# Patient Record
Sex: Male | Born: 1935 | Race: White | Hispanic: No | Marital: Married | Smoking: Never smoker
Health system: Southern US, Community
[De-identification: ages and names within clinical notes are randomized; demographics above are authoritative.]

## PROBLEM LIST (undated history)

## (undated) DIAGNOSIS — I4510 Unspecified right bundle-branch block: Secondary | ICD-10-CM

## (undated) DIAGNOSIS — Z8739 Personal history of other diseases of the musculoskeletal system and connective tissue: Secondary | ICD-10-CM

## (undated) DIAGNOSIS — I509 Heart failure, unspecified: Secondary | ICD-10-CM

## (undated) DIAGNOSIS — R011 Cardiac murmur, unspecified: Secondary | ICD-10-CM

## (undated) DIAGNOSIS — J439 Emphysema, unspecified: Secondary | ICD-10-CM

## (undated) DIAGNOSIS — R0602 Shortness of breath: Secondary | ICD-10-CM

## (undated) DIAGNOSIS — K219 Gastro-esophageal reflux disease without esophagitis: Secondary | ICD-10-CM

## (undated) DIAGNOSIS — M778 Other enthesopathies, not elsewhere classified: Secondary | ICD-10-CM

## (undated) DIAGNOSIS — I10 Essential (primary) hypertension: Secondary | ICD-10-CM

## (undated) DIAGNOSIS — R600 Localized edema: Secondary | ICD-10-CM

## (undated) DIAGNOSIS — I480 Paroxysmal atrial fibrillation: Secondary | ICD-10-CM

## (undated) DIAGNOSIS — Z8679 Personal history of other diseases of the circulatory system: Secondary | ICD-10-CM

## (undated) DIAGNOSIS — Z5189 Encounter for other specified aftercare: Secondary | ICD-10-CM

## (undated) DIAGNOSIS — R351 Nocturia: Secondary | ICD-10-CM

## (undated) DIAGNOSIS — E7800 Pure hypercholesterolemia, unspecified: Secondary | ICD-10-CM

## (undated) DIAGNOSIS — R609 Edema, unspecified: Secondary | ICD-10-CM

## (undated) DIAGNOSIS — H269 Unspecified cataract: Secondary | ICD-10-CM

## (undated) DIAGNOSIS — M1990 Unspecified osteoarthritis, unspecified site: Secondary | ICD-10-CM

## (undated) HISTORY — DX: Personal history of other diseases of the musculoskeletal system and connective tissue: Z87.39

## (undated) HISTORY — DX: Nocturia: R35.1

## (undated) HISTORY — DX: Unspecified right bundle-branch block: I45.10

## (undated) HISTORY — DX: Pure hypercholesterolemia, unspecified: E78.00

## (undated) HISTORY — DX: Emphysema, unspecified: J43.9

## (undated) HISTORY — DX: Gastro-esophageal reflux disease without esophagitis: K21.9

## (undated) HISTORY — DX: Edema, unspecified: R60.9

## (undated) HISTORY — PX: EYE SURGERY: SHX253

## (undated) HISTORY — DX: Heart failure, unspecified: I50.9

## (undated) HISTORY — PX: JOINT REPLACEMENT: SHX530

## (undated) HISTORY — DX: Cardiac murmur, unspecified: R01.1

## (undated) HISTORY — DX: Shortness of breath: R06.02

## (undated) HISTORY — DX: Other enthesopathies, not elsewhere classified: M77.8

## (undated) HISTORY — PX: SPINE SURGERY: SHX786

## (undated) HISTORY — DX: Localized edema: R60.0

## (undated) HISTORY — DX: Personal history of other diseases of the circulatory system: Z86.79

## (undated) HISTORY — DX: Unspecified osteoarthritis, unspecified site: M19.90

## (undated) HISTORY — DX: Essential (primary) hypertension: I10

## (undated) HISTORY — DX: Paroxysmal atrial fibrillation: I48.0

## (undated) HISTORY — DX: Encounter for other specified aftercare: Z51.89

## (undated) HISTORY — DX: Unspecified cataract: H26.9

---

## 2001-05-24 ENCOUNTER — Ambulatory Visit (HOSPITAL_BASED_OUTPATIENT_CLINIC_OR_DEPARTMENT_OTHER): Admission: RE | Admit: 2001-05-24 | Discharge: 2001-05-24 | Payer: Self-pay | Admitting: Orthopedic Surgery

## 2002-04-05 ENCOUNTER — Ambulatory Visit (HOSPITAL_COMMUNITY): Admission: RE | Admit: 2002-04-05 | Discharge: 2002-04-05 | Payer: Self-pay | Admitting: Surgery

## 2002-04-05 ENCOUNTER — Encounter: Payer: Self-pay | Admitting: Cardiology

## 2002-11-17 ENCOUNTER — Ambulatory Visit (HOSPITAL_COMMUNITY): Admission: RE | Admit: 2002-11-17 | Discharge: 2002-11-17 | Payer: Self-pay | Admitting: Gastroenterology

## 2002-11-17 ENCOUNTER — Encounter (INDEPENDENT_AMBULATORY_CARE_PROVIDER_SITE_OTHER): Payer: Self-pay | Admitting: Specialist

## 2004-02-11 HISTORY — PX: TOTAL KNEE ARTHROPLASTY: SHX125

## 2004-02-11 HISTORY — PX: REPLACEMENT TOTAL KNEE: SUR1224

## 2004-02-20 ENCOUNTER — Inpatient Hospital Stay (HOSPITAL_COMMUNITY): Admission: RE | Admit: 2004-02-20 | Discharge: 2004-02-25 | Payer: Self-pay | Admitting: Orthopedic Surgery

## 2005-05-25 ENCOUNTER — Encounter: Admission: RE | Admit: 2005-05-25 | Discharge: 2005-05-25 | Payer: Self-pay | Admitting: Orthopedic Surgery

## 2005-05-30 ENCOUNTER — Inpatient Hospital Stay (HOSPITAL_COMMUNITY): Admission: RE | Admit: 2005-05-30 | Discharge: 2005-06-01 | Payer: Self-pay | Admitting: Specialist

## 2005-12-19 ENCOUNTER — Inpatient Hospital Stay (HOSPITAL_COMMUNITY): Admission: RE | Admit: 2005-12-19 | Discharge: 2005-12-20 | Payer: Self-pay | Admitting: Neurosurgery

## 2006-02-10 HISTORY — PX: PATELLAR TENDON REPAIR: SHX737

## 2006-03-22 ENCOUNTER — Encounter: Admission: RE | Admit: 2006-03-22 | Discharge: 2006-03-22 | Payer: Self-pay | Admitting: Neurosurgery

## 2007-02-11 HISTORY — PX: CARDIAC CATHETERIZATION: SHX172

## 2007-08-20 ENCOUNTER — Encounter: Admission: RE | Admit: 2007-08-20 | Discharge: 2007-08-20 | Payer: Self-pay | Admitting: Cardiology

## 2007-08-23 ENCOUNTER — Inpatient Hospital Stay (HOSPITAL_BASED_OUTPATIENT_CLINIC_OR_DEPARTMENT_OTHER): Admission: RE | Admit: 2007-08-23 | Discharge: 2007-08-24 | Payer: Self-pay | Admitting: Cardiology

## 2007-11-08 ENCOUNTER — Encounter: Admission: RE | Admit: 2007-11-08 | Discharge: 2007-11-08 | Payer: Self-pay | Admitting: Neurosurgery

## 2007-11-18 ENCOUNTER — Ambulatory Visit: Payer: Self-pay | Admitting: Internal Medicine

## 2007-11-18 DIAGNOSIS — R0609 Other forms of dyspnea: Secondary | ICD-10-CM

## 2007-11-22 ENCOUNTER — Ambulatory Visit: Payer: Self-pay | Admitting: Internal Medicine

## 2007-11-27 DIAGNOSIS — E785 Hyperlipidemia, unspecified: Secondary | ICD-10-CM | POA: Insufficient documentation

## 2007-11-27 DIAGNOSIS — I10 Essential (primary) hypertension: Secondary | ICD-10-CM

## 2007-11-29 ENCOUNTER — Telehealth: Payer: Self-pay | Admitting: Internal Medicine

## 2007-12-02 ENCOUNTER — Encounter: Payer: Self-pay | Admitting: Internal Medicine

## 2007-12-28 ENCOUNTER — Ambulatory Visit: Payer: Self-pay | Admitting: Internal Medicine

## 2007-12-28 DIAGNOSIS — J449 Chronic obstructive pulmonary disease, unspecified: Secondary | ICD-10-CM | POA: Insufficient documentation

## 2008-01-26 ENCOUNTER — Ambulatory Visit: Payer: Self-pay | Admitting: Internal Medicine

## 2008-02-11 HISTORY — PX: BACK SURGERY: SHX140

## 2008-07-26 ENCOUNTER — Ambulatory Visit: Payer: Self-pay | Admitting: Internal Medicine

## 2009-05-17 ENCOUNTER — Encounter: Admission: RE | Admit: 2009-05-17 | Discharge: 2009-05-17 | Payer: Self-pay | Admitting: Neurosurgery

## 2009-05-20 ENCOUNTER — Encounter: Admission: RE | Admit: 2009-05-20 | Discharge: 2009-05-20 | Payer: Self-pay | Admitting: Neurosurgery

## 2009-07-26 ENCOUNTER — Ambulatory Visit: Payer: Self-pay | Admitting: Internal Medicine

## 2009-10-18 ENCOUNTER — Encounter: Payer: Self-pay | Admitting: Internal Medicine

## 2009-11-06 ENCOUNTER — Encounter: Payer: Self-pay | Admitting: Internal Medicine

## 2009-11-20 ENCOUNTER — Ambulatory Visit: Payer: Self-pay | Admitting: Cardiology

## 2009-11-27 ENCOUNTER — Ambulatory Visit: Payer: Self-pay | Admitting: Cardiology

## 2009-12-03 ENCOUNTER — Telehealth (INDEPENDENT_AMBULATORY_CARE_PROVIDER_SITE_OTHER): Payer: Self-pay | Admitting: *Deleted

## 2009-12-04 ENCOUNTER — Ambulatory Visit: Payer: Self-pay | Admitting: Cardiovascular Disease

## 2009-12-04 ENCOUNTER — Encounter (HOSPITAL_COMMUNITY)
Admission: RE | Admit: 2009-12-04 | Discharge: 2010-02-09 | Payer: Self-pay | Source: Home / Self Care | Attending: Cardiology | Admitting: Cardiology

## 2009-12-04 ENCOUNTER — Ambulatory Visit: Payer: Self-pay

## 2009-12-04 ENCOUNTER — Encounter: Payer: Self-pay | Admitting: Cardiovascular Disease

## 2009-12-24 ENCOUNTER — Inpatient Hospital Stay (HOSPITAL_COMMUNITY)
Admission: RE | Admit: 2009-12-24 | Discharge: 2009-12-27 | Payer: Self-pay | Source: Home / Self Care | Admitting: Neurosurgery

## 2009-12-24 HISTORY — PX: BACK SURGERY: SHX140

## 2010-01-31 ENCOUNTER — Encounter
Admission: RE | Admit: 2010-01-31 | Discharge: 2010-01-31 | Payer: Self-pay | Source: Home / Self Care | Attending: Neurosurgery | Admitting: Neurosurgery

## 2010-02-14 ENCOUNTER — Ambulatory Visit: Payer: Self-pay | Admitting: Cardiology

## 2010-02-15 ENCOUNTER — Ambulatory Visit: Payer: Self-pay | Admitting: Oncology

## 2010-02-18 ENCOUNTER — Encounter: Payer: Self-pay | Admitting: Internal Medicine

## 2010-02-18 LAB — CBC WITH DIFFERENTIAL/PLATELET
BASO%: 0.7 % (ref 0.0–2.0)
Basophils Absolute: 0 10*3/uL (ref 0.0–0.1)
EOS%: 0.3 % (ref 0.0–7.0)
Eosinophils Absolute: 0 10*3/uL (ref 0.0–0.5)
HCT: 41.3 % (ref 38.4–49.9)
HGB: 14.3 g/dL (ref 13.0–17.1)
LYMPH%: 29.2 % (ref 14.0–49.0)
MCH: 33.7 pg — ABNORMAL HIGH (ref 27.2–33.4)
MCHC: 34.6 g/dL (ref 32.0–36.0)
MCV: 97.4 fL (ref 79.3–98.0)
MONO#: 0.6 10*3/uL (ref 0.1–0.9)
MONO%: 8.9 % (ref 0.0–14.0)
NEUT#: 3.8 10*3/uL (ref 1.5–6.5)
NEUT%: 60.9 % (ref 39.0–75.0)
Platelets: 163 10*3/uL (ref 140–400)
RBC: 4.24 10*6/uL (ref 4.20–5.82)
RDW: 14.2 % (ref 11.0–14.6)
WBC: 6.3 10*3/uL (ref 4.0–10.3)
lymph#: 1.8 10*3/uL (ref 0.9–3.3)

## 2010-02-18 LAB — RETICULOCYTES
Immature Retic Fract: 5.4 % (ref 0.00–13.40)
RBC: 4.31 10*6/uL (ref 4.20–5.82)
Retic %: 2.39 % — ABNORMAL HIGH (ref 0.50–1.60)
Retic Ct Abs: 103.01 10*3/uL — ABNORMAL HIGH (ref 24.10–77.50)

## 2010-02-18 LAB — CHCC SMEAR

## 2010-02-18 LAB — FERRITIN: Ferritin: 63 ng/mL (ref 22–322)

## 2010-02-18 LAB — VITAMIN B12: Vitamin B-12: 193 pg/mL — ABNORMAL LOW (ref 211–911)

## 2010-02-19 ENCOUNTER — Ambulatory Visit: Payer: Self-pay | Admitting: Cardiology

## 2010-03-12 ENCOUNTER — Encounter
Admission: RE | Admit: 2010-03-12 | Discharge: 2010-03-12 | Payer: Self-pay | Source: Home / Self Care | Attending: Neurosurgery | Admitting: Neurosurgery

## 2010-03-12 NOTE — Assessment & Plan Note (Signed)
Summary: 12 months/apc   Primary Provider/Referring Provider:  Ronny Flurry  CC:  Yearly Follow up visit-doing good; no complaints..  History of Present Illness: 12/28/07- Chronic bronchitis, dyspnea Increased wheeze, cough x 2 weeks. Denies fever, purulent or bloody, reflux, chest pain, palpitation. PFT 10/12- mild obstruction, small airways. Nl 513 meters. Denies heart burn on Prilosec. Went back to benazepril- saw no difference. spiriva no help.  01/26/08- Chronic bronchits, dyspnea Doing better with Advair. Less dyspnea, but expensive. Discussed options. No cough and no acute concerns. We discussed stimulant effect vs steroid effect.  6/15.10- Chronic bronchitis, dyspnea Some tightness in Spring pollen but ok since. Breathes well unless he really overexerts- especially hills and stairs. Denies chest pain, palpitation, cough, phlegm, wheeze, nasal congestion or sinus drainage. Advair once daily is usually sufficient.   July 26, 2009- Chronic bronchitis, dyspnea One year f/u. Breathing has done very well. He is getting epidurals for spondylolisthesis and anticipates surgery in late summer (Dr Wynetta Emery). Contiinues to use Advair once daily, recently back to two times a day since air quality has been poor. Breathing doing much better. Occasional sense of "collection" in throat- clear mucus. Takes prilosec- not aware of reflux or heartburn. Denies any sense of chest tightness or wheeze. Denies chest pain or palpitation. Not limited by dyspnea, but avoids steep hills. Walks golf course. PFT- 2009- FEV1 2.50/ 79%; FEV1/FVC 0.65, insig resp to BD. CXR- 2 yrs ago- Dr Patty Sermons     Preventive Screening-Counseling & Management  Alcohol-Tobacco     Smoking Status: never  Current Medications (verified): 1)  Advair Diskus 250-50 Mcg/dose Misc (Fluticasone-Salmeterol) .[name].. 1 Puff and Rinse Twice Daily 2)  Lipitor 40 Mg Tabs (Atorvastatin Calcium) .[name].. 1 Tablet  Once Daily 3)  Lotrel 10-20 Mg  Caps (Amlodipine Besy-Benazepril Hcl) .[name].. 1 Capsule Once Daily N 4)  Ziac 10-6.25 Mg Tabs (Bisoprolol-Hydrochlorothiazide) .[name].. 1 Tablet Once Daily 5)  Prilosec 20 Mg Cpdr (Omeprazole) .... Take 1 By Mouth Once Daily 6)  Gabapentin 300 Mg Caps (Gabapentin) .... Take 1-2 By Mouth Once Daily  Allergies (verified): 1)  ! 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  social ETOH with golf married retired Acupuncturist  Risk Factors: Smoking Status: never (07/26/2009)  Family History: COPD- brothers and father smoked asthma- sister adult onset Father- died COPD, CHF age 86 Mother - died accident  Review of Systems      See HPI       The patient complains of shortness of breath with activity.  The patient denies shortness of breath at rest, productive cough, non-productive cough, coughing up blood, chest pain, irregular heartbeats, acid heartburn, indigestion, loss of appetite, weight change, abdominal pain, difficulty swallowing, sore throat, tooth/dental problems, headaches, nasal congestion/difficulty breathing through nose, and sneezing.    Vital Signs:  Patient profile:   76 year old male Height:      73 inches Weight:      219 pounds BMI:     29.00 O2 Sat:      95 % on Room air Pulse rate:   60 / minute BP sitting:   124 / 68  (left arm) Cuff  size:   regular  Vitals Entered By: Reynaldo Minium CMA (July 26, 2009 9:02 AM)  O2 Flow:  Room air CC: Yearly Follow up visit-doing good; no complaints.   Physical Exam  Additional Exam:  General: A/Ox3; pleasant  and cooperative, NAD, Fit appearing SKIN: no rash, lesions NODES: no lymphadenopathy HEENT: [place]/AT, EOM- WNL, Conjuctivae- clear, PERRLA, TM-WNL, Nose- clear, Throat- clear and wnl, Mallampati II NECK: Supple w/ fair ROM, JVD- none, NO HJR, normal carotid impulses w/o bruits Thyroid-  CHEST: No cough, no wheeze or rhonchi, unlabored. Clear to P&A HEART: RRR, no m/g/r heard ABDOMEN: Soft and nl; WNU:UVOZ, nl pulses, no cyanosis, clyubbing or edema NEURO: Grossly intact to observation      Impression & Recommendations:  Problem # 1:  BRONCHITIS (ICD-490)  Chronic asthmatic bronchitis, controlled with Advair. I can't get hx suggesting he would benfit from a rescue inhlaer. PFT in 2009 reviewed. We will get CXR anticipating Surgery later this summer.   I do not find problems of significant increased risk or neediing remediation before planned surgery/ GOT anesthesia.  His updated medication list for this problem includes:    Advair Diskus 250-50 Mcg/dose Misc (Fluticasone-salmeterol) .[name]... 1 puff and rinse twice daily  Problem # 2:  DYSPNEA (ICD-786.05) Primarily consistent eith his lung function, but a conditioning component may be present and regular exercise is encouraged.  Medications Added to Medication List This Visit: 1)  Prilosec 20 Mg Cpdr (Omeprazole) .... Take 1 by mouth once daily 2)  Gabapentin 300 Mg Caps (Gabapentin) .... Take 1-2 by mouth once daily  Other Orders: Est. Patient Level IV (36644) T-2 View CXR (71020TC)  Patient Instructions: 1)  Please schedule a follow-up appointment in 1 year. 2)  CC- Dr Patty Sermons, Dr Wynetta Emery 3)  A chest x-ray has been recommended.  Your imaging study may require preauthorization.  4)  Continue Advair 1-2 x/ daily as before.

## 2010-03-12 NOTE — Letter (Signed)
Summary: [facility]  [facility]   Imported By: Lester Harrisburg 11/23/2009 08:11:37  _____________________________________________________________________  External Attachment:    Type:   Image     Comment:   External Document

## 2010-03-12 NOTE — Progress Notes (Signed)
Summary: Nuclear pre procedure  Phone Note Outgoing Call Call back at [facility] Phone [phone]   Call placed by: Rea College, CMA,  December 03, 2009 3:05 PM Call placed to: Patient Summary of Call: Reviewed information on My[employer] Information Sheet (see scanned document for further details).  Thurston Hole spoke with patient.     Nuclear Med Background Indications for Stress Test: Evaluation for Ischemia, Surgical Clearance  Indications Comments: Pending back surgery on 12/24/09 by Dr. Donalee Citrin  History: Heart Catheterization, Myocardial Perfusion Study  History Comments: '09 [id number] scar with mild inferior ischemia, EF=50%>Cath:n/o CAD.     Nuclear Pre-Procedure Cardiac Risk Factors: Family History - CAD, Hypertension, Lipids, RBBB Height (in): 73

## 2010-03-12 NOTE — Letter (Signed)
Summary: Surgical clearance/[facility]  Surgical clearance/[facility]   Imported By: Lester Edenborn 11/23/2009 08:08:20  _____________________________________________________________________  External Attachment:    Type:   Image     Comment:   External Document

## 2010-03-12 NOTE — Assessment & Plan Note (Signed)
Summary: Cardiology Nuclear Testing  Nuclear Med Background Indications for Stress Test: Evaluation for Ischemia, Surgical Clearance  Indications Comments: Pending back surgery on 12/24/09 by Dr. Donalee Citrin  History: Heart Catheterization, Myocardial Perfusion Study  History Comments: '09 [id number] scar with mild inferior ischemia, EF=50%>Cath:n/o CAD.     Nuclear Pre-Procedure Cardiac Risk Factors: Family History - CAD, Hypertension, Lipids, RBBB Caffeine/Decaff Intake: None NPO After: 8:00 PM IV 0.9% NS with Angio Cath: 20g     IV Site: R Antecubital IV Started by: Irean Hong, RN Chest Size (in) 48     Height (in): 73 Weight (lb): 218 BMI: 28.87 Tech Comments: Held Ziac x 24 hrs.  Nuclear Med Study 1 or 2 day study:  1 day     Reading MD:  Charlton Haws, MD     Resting Radionuclide:  Technetium 89m Tetrofosmin     Resting Radionuclide Dose:  11 mCi  Stress Radionuclide:  Technetium 43m Tetrofosmin     Stress Radionuclide Dose:  33 mCi   Stress Protocol          Nuclear Technologist:  Domenic Polite, CNMT  Rest Procedure  Myocardial perfusion imaging was performed at rest 45 minutes following the intravenous administration of Technetium 51m Tetrofosmin.  QPS Raw Data Images:  Normal; no motion artifact; normal heart/lung ratio. Stress Images:  Normal homogeneous uptake in all areas of the myocardium. Rest Images:  Normal homogeneous uptake in all areas of the myocardium. Subtraction (SDS):  SDS 0 Transient Ischemic Dilatation:  1.03  (Normal <1.22)  Lung/Heart Ratio:  .36  (Normal <0.45)  Quantitative Gated Spect Images QGS EDV:  154 ml QGS ESV:  74 ml QGS EF:  52 % QGS cine images:  apical hypokinesis  Findings Low risk nuclear study  Evidence for anterior (septal apical) infarct     Overall Impression  Exercise Capacity: Lexiscan with no exercise. BP Response: Normal blood pressure response. 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  No ishcemia  Appended Document: Cardiology Nuclear Testing Stress Protocol:  The patient received IV Lexiscan .4mg  over 15 seconds with concurrent exercise and then Technetium 69m Tetrofosmin injected at 30 seconds. There were no significant changes with Lexiscan. Quanitative spect images were obtained after a 45 minute delay.  This append is for missing information.

## 2010-03-21 ENCOUNTER — Encounter (HOSPITAL_BASED_OUTPATIENT_CLINIC_OR_DEPARTMENT_OTHER): Payer: MEDICARE | Admitting: Oncology

## 2010-03-21 ENCOUNTER — Other Ambulatory Visit: Payer: Self-pay | Admitting: Oncology

## 2010-03-21 DIAGNOSIS — D696 Thrombocytopenia, unspecified: Secondary | ICD-10-CM

## 2010-03-28 NOTE — Letter (Signed)
Summary: [facility]  [facility]   Imported By: Lennie Odor 03/19/2010 12:11:27  _____________________________________________________________________  External Attachment:    Type:   Image     Comment:   External Document

## 2010-04-23 LAB — BASIC METABOLIC PANEL
BUN: 7 mg/dL (ref 6–23)
Calcium: 8.4 mg/dL (ref 8.4–10.5)
GFR calc non Af Amer: >60 mL/min (ref >60)
Potassium: 4.1 mEq/L (ref 3.5–5.1)
Sodium: 135 mEq/L (ref 135–145)

## 2010-04-23 LAB — CBC
Hemoglobin: 16.5 g/dL (ref 13.0–17.0)
MCH: 34.1 pg — ABNORMAL HIGH (ref 26.0–34.0)
RBC: 4.84 MIL/uL (ref 4.22–5.81)
WBC: 5.5 10*3/uL (ref 4.0–10.5)

## 2010-04-23 LAB — COMPREHENSIVE METABOLIC PANEL
ALT: 31 U/L (ref 0–53)
AST: 34 U/L (ref 0–37)
Alkaline Phosphatase: 76 U/L (ref 39–117)
CO2: 29 mEq/L (ref 19–32)
Chloride: 99 mEq/L (ref 96–112)
GFR calc Af Amer: >60 mL/min (ref >60)
GFR calc non Af Amer: >60 mL/min (ref >60)
Potassium: 4.2 mEq/L (ref 3.5–5.1)
Sodium: 136 mEq/L (ref 135–145)
Total Bilirubin: 1 mg/dL (ref 0.3–1.2)

## 2010-04-23 LAB — DIFFERENTIAL
Basophils Absolute: 0 10*3/uL (ref 0.0–0.1)
Eosinophils Absolute: 0 10*3/uL (ref 0.0–0.7)
Eosinophils Relative: 1 % (ref 0–5)
Monocytes Absolute: 0.6 10*3/uL (ref 0.1–1.0)

## 2010-04-23 LAB — SURGICAL PCR SCREEN: Staphylococcus aureus: POSITIVE — AB

## 2010-05-09 ENCOUNTER — Ambulatory Visit
Admission: RE | Admit: 2010-05-09 | Discharge: 2010-05-09 | Disposition: A | Discharge status: Home / Self Care | Payer: MEDICARE | Source: Ambulatory Visit | Attending: Neurosurgery | Admitting: Neurosurgery

## 2010-05-09 ENCOUNTER — Other Ambulatory Visit: Payer: Self-pay | Admitting: Neurosurgery

## 2010-05-09 DIAGNOSIS — M549 Dorsalgia, unspecified: Secondary | ICD-10-CM

## 2010-05-28 ENCOUNTER — Other Ambulatory Visit (INDEPENDENT_AMBULATORY_CARE_PROVIDER_SITE_OTHER): Payer: MEDICARE | Admitting: *Deleted

## 2010-05-28 DIAGNOSIS — E7800 Pure hypercholesterolemia, unspecified: Secondary | ICD-10-CM

## 2010-05-28 DIAGNOSIS — Z79899 Other long term (current) drug therapy: Secondary | ICD-10-CM

## 2010-05-28 LAB — CBC WITH DIFFERENTIAL/PLATELET
Basophils Relative: 0.3 % (ref 0.0–3.0)
Eosinophils Absolute: 0 10*3/uL (ref 0.0–0.7)
Eosinophils Relative: 0.6 % (ref 0.0–5.0)
Hemoglobin: 14.7 g/dL (ref 13.0–17.0)
Lymphocytes Relative: 34.9 % (ref 12.0–46.0)
MCHC: 35 g/dL (ref 30.0–36.0)
Monocytes Relative: 9 % (ref 3.0–12.0)
Neutro Abs: 3.3 10*3/uL (ref 1.4–7.7)
Neutrophils Relative %: 55.2 % (ref 43.0–77.0)
RBC: 4.32 Mil/uL (ref 4.22–5.81)
WBC: 5.9 10*3/uL (ref 4.5–10.5)

## 2010-05-28 LAB — BASIC METABOLIC PANEL
CO2: 30 mEq/L (ref 19–32)
Calcium: 9.5 mg/dL (ref 8.4–10.5)
Sodium: 134 mEq/L — ABNORMAL LOW (ref 135–145)

## 2010-05-28 LAB — LIPID PANEL: Total CHOL/HDL Ratio: 3

## 2010-05-28 LAB — HEPATIC FUNCTION PANEL
ALT: 23 U/L (ref 0–53)
AST: 24 U/L (ref 0–37)
Albumin: 4 g/dL (ref 3.5–5.2)
Alkaline Phosphatase: 66 U/L (ref 39–117)
Bilirubin, Direct: 0.2 mg/dL (ref 0.0–0.3)
Total Protein: 6.4 g/dL (ref 6.0–8.3)

## 2010-06-03 ENCOUNTER — Encounter: Payer: Self-pay | Admitting: Cardiology

## 2010-06-03 DIAGNOSIS — I4510 Unspecified right bundle-branch block: Secondary | ICD-10-CM | POA: Insufficient documentation

## 2010-06-03 DIAGNOSIS — R351 Nocturia: Secondary | ICD-10-CM | POA: Insufficient documentation

## 2010-06-03 DIAGNOSIS — R0602 Shortness of breath: Secondary | ICD-10-CM | POA: Insufficient documentation

## 2010-06-04 ENCOUNTER — Ambulatory Visit: Payer: MEDICARE | Admitting: Cardiology

## 2010-06-10 ENCOUNTER — Ambulatory Visit (INDEPENDENT_AMBULATORY_CARE_PROVIDER_SITE_OTHER): Payer: MEDICARE | Admitting: Cardiology

## 2010-06-10 ENCOUNTER — Encounter: Payer: Self-pay | Admitting: Cardiology

## 2010-06-10 DIAGNOSIS — E7800 Pure hypercholesterolemia, unspecified: Secondary | ICD-10-CM

## 2010-06-10 DIAGNOSIS — I10 Essential (primary) hypertension: Secondary | ICD-10-CM

## 2010-06-10 DIAGNOSIS — E785 Hyperlipidemia, unspecified: Secondary | ICD-10-CM

## 2010-06-10 DIAGNOSIS — R0602 Shortness of breath: Secondary | ICD-10-CM

## 2010-06-10 NOTE — Assessment & Plan Note (Signed)
The patient has had no chest pain or shortness of breath dizziness or syncope.  Energy level is good.

## 2010-06-10 NOTE — Assessment & Plan Note (Signed)
We reviewed his blood work from last week which is excellent.  As he looks back he thinks that it is the best report that he has had so far.  He has been more careful with his diet as well as taking his medication as prescribed.  His not having any myalgias from his statin therapy

## 2010-06-10 NOTE — Progress Notes (Signed)
Theodore [name] Date of Birth:  Jun 26, 1934 [facility] / [facility] [address]   [address] [place], [place]  [address] [phone]           Fax   [phone]  HPI: This 76 year old gentleman has a history of essential hypertension and a history of hypercholesterolemia.  He had a cardiac catheterization in 2009 after an abnormal Cardiolite study that showed only nonobstructive atherosclerotic coronary disease and normal left ventricular function.  The patient has a past history of essential hypertension which has responded well to Ziac and Lotrel.  Has a past history of dyspnea patient underwent an echocardiogram on 8/19/9 which showed normal systolic function with impaired relaxation mild left atrial enlargement mild aortic sclerosis with mild to moderate aortic insufficiency and normal pulmonary artery pressure.  Current Outpatient Prescriptions  Medication Sig Dispense Refill  . amLODipine-benazepril (LOTREL) 10-40 MG per capsule Take 1 capsule by mouth daily.        . Ascorbic Acid (VITAMIN C PO) Take by mouth daily.        [name] aspirin 81 MG tablet Take 81 mg by mouth daily.        [name] atorvastatin (LIPITOR) 40 MG tablet Take 40 mg by mouth daily.        . bisoprolol-hydrochlorothiazide (ZIAC) 2.5-6.25 MG per tablet Take 1 tablet by mouth daily.        . Fluticasone-Salmeterol (ADVAIR DISKUS) 250-50 MCG/DOSE AEPB Inhale 1 puff into the lungs daily.        [name] LYSINE PO Take by mouth daily.        . Omega-3 Fatty Acids (FISH OIL) 1000 MG CAPS Take by mouth daily.        [name] omeprazole (PRILOSEC) 20 MG capsule Take 20 mg by mouth daily.        [name] gabapentin (NEURONTIN) 300 MG capsule Take 300 mg by mouth daily.          Allergies  Allergen Reactions  . Ace Inhibitors   . Lopid (Gemfibrozil)   . Niacin And Related   . Penicillins   . Tamiflu     Rash,itching  . Zocor (Simvastatin)     Patient Active Problem List  Diagnoses  . HYPERLIPIDEMIA  . HYPERTENSION  .  BRONCHITIS  . DYSPNEA  . Hypertension  . Hypercholesterolemia  . SOB (shortness of breath)  . Peripheral edema  . Nocturia  . Right bundle branch block    History  Smoking status  . Never Smoker   Smokeless tobacco  . Not on file    History  Alcohol Use No    Family History  Problem Relation Age of Onset  . Heart attack Father   . Heart disease Brother   . Heart disease Brother     Review of Systems: The patient denies any heat or cold intolerance.  No weight gain or weight loss.  The patient denies headaches or blurry vision.  There is no cough or sputum production.  The patient denies dizziness.  There is no hematuria or hematochezia.  The patient denies any muscle aches or arthritis.  The patient denies any rash.  The patient denies frequent falling or instability.  There is no history of depression or anxiety.  All other systems were reviewed and are negative.   Physical Exam: Filed Vitals:   06/10/10 1036  BP: 130/78  Pulse: 66  General appearance reveals a well-developed well-nourished gentleman in no distress.Pupils equal and reactive.  Extraocular Movements are full.  There is no scleral icterus.  The mouth and pharynx are normal.  The neck is supple.  The carotids reveal no bruits.  The jugular venous pressure is normal.  The thyroid is not enlarged.  There is no lymphadenopathy.The chest is clear to percussion and auscultation. There are no rales or rhonchi. Expansion of the chest is symmetrical.The precordium is quiet.  The first heart sound is normal.  The second heart sound is physiologically split.  There is no murmur gallop rub or click.  There is no abnormal lift or heave.The abdomen is soft and nontender. Bowel sounds are normal. The liver and spleen are not enlarged. There Are no abdominal masses. There are no bruits.The pedal pulses are good.  There is no phlebitis or edema.  There is no cyanosis or clubbing.Strength is normal and symmetrical in all  extremities.  There is no lateralizing weakness.  There are no sensory deficits.    Assessment / Plan:  Continue same medication.  Recheck in 4 months.  He'll get fasting blood work prior to his next visit

## 2010-06-10 NOTE — Assessment & Plan Note (Signed)
The patient has been doing well since his last visit.  He had successful back surgery on 12/24/09 by Dr. Perrin Maltese activity has been limited during the winter but now he is ready to start playing golf again.  He has been out to the driving range practicing not had any difficulty.

## 2010-06-25 NOTE — H&P (Signed)
NAME:  Theodore [name], DIEL NO.:  [id number]   MEDICAL RECORD NO.:  [id number]          PATIENT TYPE:  OIB   LOCATION:  NA                           FACILITY:  [facility]   PHYSICIAN:  Peter M. Swaziland, M.D.  DATE OF BIRTH:  06-Aug-1934   DATE OF ADMISSION:  DATE OF DISCHARGE:                              HISTORY & PHYSICAL   HISTORY OF PRESENT ILLNESS:  Theodore [name] is a 76 year old white male who  has a history of hypertension and hypercholesterolemia.  He also has a  family history of early coronary artery disease.  He states for the past  2 months he has been experiencing symptoms of increasing dyspnea on  exertion and increased fatigue on exertion.  He founds it difficult  going up steps now.  He also has a hanging feeling in his throat.  He  feels that he has really gone downhill in the last 2 months.  He has  also noted some mild increased ankle swelling.  He denies any true chest  pain; however, given his risk factors, he did undergo a stress  Cardiolite study on August 18, 2007, by Dr. Patty Sermons.  His resting ECG  showed a right bundle branch block.  He had an adenosine study.  His  Cardiolite images demonstrated reversible inferoseptal defect consistent  with ischemia and his ejection fraction was 50%.  Given these  abnormalities, recommend that he undergo cardiac catheterization.   PAST MEDICAL HISTORY:  1. The patient has had previous left knee surgery by Dr. Shelle Iron.  2. He has had a history of cervical disk disease.  3. History of hypertension.  4. Hypercholesterolemia.  5. He has had degenerative arthritis of his right knee as well.   ALLERGIES:  PENICILLIN.   CURRENT MEDICATIONS:  1. Aspirin 81 mg per day.  2. Ziac 2.5/6.25 mg daily.  3. Lipitor 40 mg per day.  4. Fish oil 1000 mg per day.  5. Prilosec 20 mg per day.  6. Lotrel 10/40 mg per day.  7. L-Lysine daily.  8. Vitamin C daily.   SOCIAL HISTORY:  The patient is a retired Char[employer] fundraiser.  He denies  tobacco use.  He stays active playing golf.   FAMILY HISTORY:  Father died of myocardial infarction.  Two brothers  have had coronary artery disease.   REVIEW OF SYSTEMS:  The patient denies orthopnea or PND.  He has had no  history of TIA or stroke.  He denies any bleeding difficulty, denies any  recent change in bowel or bladder habits.  Other review of systems are  negative.   PHYSICAL EXAM:  GENERAL:  Pleasant white male in no apparent distress.  VITAL SIGNS:  Weight is 220, blood pressure is 122/78, pulse 60 and  regular, respirations are normal.  HEENT:  Normocephalic, atraumatic.  Pupils equal, round, and reactive to  light and accommodation.  Extraocular movements are full.  Oropharynx is  clear.  NECK:  Supple without JVD, adenopathy, thyromegaly or bruits.  LUNGS:  Clear.  CARDIAC:  Reveals regular rate and rhythm without gallop, murmur, rub or  click.  ABDOMEN:  Soft, nontender.  He has no masses or bruits.  EXTREMITIES:  Without edema.  Pulses are 2+ and symmetric.  NEUROLOGIC:  Nonfocal.   LABORATORY DATA:  Again ECG at rest shows sinus bradycardia with  occasional PVC and right bundle branch block.   IMPRESSION:  1. Symptoms of dyspnea on exertion and fatigue.  The patient has      multiple cardiac risk factors and has an abnormal adenosine      Cardiolite study.  2. Hypertension.  3. Hypercholesterolemia.  4. Family history of early coronary artery disease.  5. History of osteoarthritis.   PLAN:  Proceed with diagnostic cardiac catheterization with further  therapy pending these results.           ______________________________  Peter M. Swaziland, M.D.     PMJ/MEDQ  D:  08/20/2007  T:  08/21/2007  Job:  [id number]   cc:   Cassell Clement, M.D.

## 2010-06-25 NOTE — Cardiovascular Report (Signed)
NAME:  Theodore [name], Theodore [name] NO.:  [id number]   MEDICAL RECORD NO.:  [id number]         PATIENT TYPE:  JCAR   LOCATION:                                 FACILITY:   PHYSICIAN:  Peter M. Swaziland, M.D.  DATE OF BIRTH:  [birth year]   DATE OF PROCEDURE:  DATE OF DISCHARGE:                            CARDIAC CATHETERIZATION   INDICATIONS FOR PROCEDURE:  The patient is a 76 year old white male with  history of hypertension, hypercholesterolemia, and family history of  early coronary disease.  He has been experiencing symptoms of dyspnea on  exertion and some increased fatigue.  An adenosine Cardiolite study was  suggestive of inferior septal ischemia.  Ejection fraction is 50%.   PROCEDURES:  1. Left heart catheterization.  2. Coronary and left ventricular angiography.  3. Access via the right femoral artery using the standard Seldinger      technique.   EQUIPMENTS USED:  1. A 4-French 4-cm left Judkins catheter.  2. A 4-French left Amplatz 1 catheter.  3. A 4-French pigtail catheter.  4. A 4-French arterial sheath.   MEDICATIONS:  Local anesthesia, 1% Xylocaine.   CONTRAST:  100 mL of Omnipaque.   HEMODYNAMIC DATA:  Aortic pressure is 116/68 with a mean of 88 mmHg.  Left ventricle pressure is 122 with EDP of 13 mmHg.   ANGIOGRAPHIC DATA:  The left coronary artery arises and distributes  normally.  The left main coronary is very short and appears normal.   The left anterior descending artery is moderately calcified proximally.  There is 30% narrowing in the very proximal vessel and then another 30%  narrowing prior to the takeoff of the first diagonal branch.  The first  diagonal is without significant disease.  The mid distal LAD also  without significant disease.   The left circumflex coronary is severely calcified in the proximal  vessel.  It has diffuse 20% narrowing in the proximal vessel.  It gives  rise to a single large marginal vessel which is without  significant  disease.   The right coronary artery is a dominant vessel.  It arises anteriorly in  the right coronary cusp.  It has moderate calcification in the proximal  to mid vessel.  There are diffuse irregularities in the proximal to mid  vessel less than 20%.  The distal vessel demonstrates some mild ectasia  with less than 20% irregularities.   Left ventricular angiography was performed in the RAO view.  This  demonstrates normal left ventricular size and contractility with normal  systolic function.  Ejection fraction is estimated at 55%.  There is no  significant mitral valve prolapse or insufficiency.   FINAL INTERPRETATION:  1. Nonobstructive atherosclerotic coronary artery disease.  2. Normal left ventricular function.   PLAN:  Recommend continued medical therapy and risk factor management.           ______________________________  Peter M. Swaziland, M.D.     PMJ/MEDQ  D:  08/23/2007  T:  08/24/2007  Job:  [id number]   cc:   Cassell Clement, M.D.

## 2010-06-28 NOTE — Op Note (Signed)
[place]  Patient:    Theodore [name], Theodore [name] Visit Number: [id number] MRN: [id number]          Service Type: DSU Location: [facility] Attending Physician:  Marlowe Kays Page Dictated by:   Illene Labrador. Aplington, M.D. Proc. Date: 05/24/01 Admit Date:  05/24/2001                             Operative Report  PREOPERATIVE DIAGNOSES: 1. Osteoarthritis. 2. Torn lateral meniscus. 3. Loose bodies, right knee.  POSTOPERATIVE DIAGNOSES: 1. Osteoarthritis. 2. Torn medial and lateral menisci. 3. Multiple loose bodies.  PROCEDURES:  Right knee arthroscopy with: 1. Partial medial and lateral meniscectomies. 2. Removal of multiple loose bodies. 3. Debridement of medial femoral condyle and patella.  SURGEON:  Illene Labrador. Aplington, M.D.  ASSISTANT:  Nurse.  ANESTHESIA:  General.  PATHOLOGY AND JUSTIFICATION FOR PROCEDURE:  He has a long history of right knee problems dating from when he was a teenager and had a medial arthrotomy. Recently, however, his knee has had _____ locking, sometimes a protrusion of the lateral joint.  Plain x-rays have demonstrated significant medial compartment narrowing with tricompartmental arthritis and one or more loose bodies of the joint.  MRI has demonstrated loose bodies in the joint associated with a badly torn lateral meniscus and wear of the remaining rim of the medial meniscus.  He has an absent ACL.  He is here today for arthroscopic surgery.  He understands that this procedure will not take care of his severe osteoarthritic problems but hopefully will take care of the mechanical disruption he is having in his knee.  DESCRIPTION OF PROCEDURE:  Satisfactory general anesthesia, pneumatic tourniquet, thigh stabilizer.  His right knee was prepped with Duraprep, draped in a sterile field.  Superior medial saline inflow.  First through an anterolateral portal the medial compartment of the knee joint was evaluated. He had  grade 3/4 chondromalacia over most of the weightbearing surface of the medial femoral condyle, and I debrided this back with baskets and a 3.5 shaver.  The remaining rim of his medial meniscus was also flagrantly torn, and after picturing I trimmed it back with baskets and shaved it down until smooth with a 3.5 shaver.  Looking up the medial gutter and suprapatellar area, he had some intermediate wear of his patella, which I debrided gently with a 3.5 shaver.  I then reversed portals.  He had significant disruption of the lateral compartment of the knee joint with a badly macerated lateral meniscus throughout most of its entire extent but particularly the anterior two-thirds.  He also had a large loose body in the anterior compartment of the knee joint, which I removed by freeing it up around the perimeter with scissors and a 4.2 shaver and then removing it in several pieces with pituitary rongeur.  I also found a number of other osteochondral fragments which had attached themselves to the synovium.  They presumably came from both surfaces of the lateral compartment of the knee joint, which were worn down to bone in most areas.  He did have an absent ACL.  The lateral meniscus was badly torn with some large peninsula-type projections into the joint, which I cut back to the base of the rim with scissors and then removed with a 4.2 shaver and in some cases with a pituitary rongeur.  When I had cleaned out the entire lateral compartment pathology, final pictures were taken.  We then under direct visualization kept pumping the knee to make sure that there were no residual fragments, and I did find several more lodged in the synovium laterally, which I removed.  At the conclusion of the case, however, there did not appear to be any fragments floating about in the knee or any other mechanical obstruction other than his osteoarthritis.  The knee was then drained.  The two anterior portals were  closed with 4-0 nylon.  Marcaine 0.5% 20 cc and 4 mg of morphine were then instilled through the inflow apparatus, which was removed and this portal closed with 4-0 nylon as well.  Betadine, Adaptic, and a dry sterile dressing were applied, tourniquet was released.  At the time of this dictation he was on his way to the recovery room in satisfactory condition with no known complications. Dictated by:   Illene Labrador. Aplington, M.D. Attending Physician:  Joaquin Courts DD:  05/24/01 TD:  05/25/01 Job: [id number]

## 2010-06-28 NOTE — Op Note (Signed)
   NAME:  Theodore [name], Theodore [name]                         ACCOUNT NO.:  [id number]   MEDICAL RECORD NO.:  [id number]                   PATIENT TYPE:  AMB   LOCATION:  ENDO                                 FACILITY:  [facility]   PHYSICIAN:  James L. Malon Kindle., M.D.          DATE OF BIRTH:  Jun 28, 1934   DATE OF PROCEDURE:  11/17/2002  DATE OF DISCHARGE:                                 OPERATIVE REPORT   PROCEDURE:  Esophagogastroduodenoscopy and biopsy.   MEDICATIONS:  1. Cetacaine spray.  2. Fentanyl 50 mcg.  3. Versed 5 mg IV.   INDICATION:  Profound iron deficiency anemia with recent negative  colonoscopy.  The patient's hemoglobin has come up to 11.5 with iron.   DESCRIPTION OF PROCEDURE:  The procedure had been explained to the patient  and consent obtained.  The patient in the left lateral decubitus position,  the Olympus scope was inserted and advanced.  The stomach was entered,  pylorus identified and passed, advanced way down into the second duodenum to  the fullest extent of the scope.  No lesions were seen.  The mucosa was  normal.  Biopsies were obtained to look for celiac disease.  The scope was  withdrawn and the duodenum, including the bulb and second portion, were all  normal.  The pyloric channel, antrum, and body of the stomach were normal.  No ulceration or inflammation.  Fundus and cardia were seen well on the  retroflexed and were normal.  The diaphragmatic hiatus was located at 44 cm.  The Z-line was seen well at 40 cm.  The patient thus had a 4 cm hiatal  hernia.  There was no ulceration or inflammation in the hiatal hernia sac.  The distal and proximal esophagus were endoscopically normal.  The scope was  withdrawn.  The patient tolerated the procedure well.   ASSESSMENT:  1. Iron deficiency anemia with exam negative other than a hiatal hernia.  2. Gastroesophageal reflux disease, a chronic problem for him.    PLAN:  1. We will check the results of the biopsies for  celiac disease.  2. We will see back in the office in 4-6 weeks and have him continue on the     iron.                                               James L. Malon Kindle., M.D.    Waldron Session  D:  11/17/2002  T:  11/17/2002  Job:  [id number]   cc:   Cassell Clement, M.D.  [address]  [place]  [place] [address]  Fax: [phone]   Talmadge Coventry, M.D.  [address]  [place]  [place] [address]  Fax: [phone]

## 2010-06-28 NOTE — Op Note (Signed)
NAME:  Theodore [name], Theodore [name] NO.:  [id number]   MEDICAL RECORD NO.:  [id number]          PATIENT TYPE:  INP   LOCATION:  0004                         FACILITY:  [facility]   PHYSICIAN:  Marlowe Kays, M.D.  DATE OF BIRTH:  Dec 23, 1934   DATE OF PROCEDURE:  02/20/2004  DATE OF DISCHARGE:                                 OPERATIVE REPORT   PREOPERATIVE DIAGNOSIS:  Advanced osteoarthritis, right knee.   POSTOPERATIVE DIAGNOSIS:  Advanced osteoarthritis, right knee.   OPERATION:  Osteonics total knee replacement, right.   SURGEON:  Marlowe Kays, M.D.   ASSISTANT:  Madlyn Frankel. Charlann Boxer, M.D.   ANESTHESIA:  Spinal.   PATHOLOGY AND JUSTIFICATION FOR PROCEDURE:  He had bone on bone in both  compartments of the knee joint with medial subluxation of the femur on the  tibia.  On entering the joint, his ACL was absent and he had marked wear of  both femoral condyles.   PROCEDURE:  Prophylactic antibiotics.  Satisfactory spinal anesthesia.  Foley catheter inserted.  Pneumatic tourniquet.  Lateral hip positioner.  Sure Foot.  Right leg was prepped from tourniquet to ankle with DuraPrep,  draped in a sterile field, and esmarched out nonsterilely.  Made a vertical  midline down to the patellar mechanism with a median parapatellar incision  to open the joint.  The patellar mechanism was freed up, the patella  everted, and the knee flexed.  I undermined the anterior portion of the pes  anserinus and medial collateral ligament.  His medial meniscus was absent,  as was his ACL.  We did a partial lateral meniscectomy.  Osteophytes were  removed from the around the patella and the femur.  We then made a 5/16 inch  drill hole in the distal femur, followed by the canal finder and the axis  aligner set for a 5 degree valgus cut for the right knee.  I took 10 mm off  the distal femur since once under anesthesia he basically had no flexion  contracture, then sized the distal femur at a #9 and  the scribe lines were  placed to attach the distal femoral cutting jig.  Anterior and posterior  cuts and posterior and anterior chamferings were then made.  Then went to  the tibia, where my initial leveling cut was made and then measured the  tibia at size 9.  After placing the internal rod, I elected to make a 4 mm  cut at 0 degrees off the lowest point on the plateaus, which was the  posterolateral tibia.  My 4 mm cut was made.  We then placed a lamina  spreader and I removed remnants of the PCL and remnants of bone in the  posterior condyles.  Then used the femoral jig for creating both the patella  and the notch for the post, and I made a preliminary notch cut with a micro  saw, removing bone.  After preparing these two items, I then went to the  patella and used the 10 mm recessed cutting jig to make a 10 mm recessed  cut.  I then used the  guide to make three fixation drill holes and then  placed the trial patella and removed excess bone from around the patella so  that it was proud to the surrounding bone.  I returned to the tibia, where  we placed the base plate.  I had previously marked scribe lines when I  checked for knee motion, and the width of the tibial spacer, which was at  least a 10 and perhaps a 12.  No additional bone had to be removed from the  tibia.  I used the external rod, splitting the bimalleolar distance to mark  the line for the base plate, which I then attached to the tibia, and we then  used the tripod cutting apparatus for the keel, working up to a 9 cemented.  We then Water-pic'd the knee and at the same time methylmethacrylate was  being mixed.  When it was hard enough and the bone surfaces were dry, we  individually glued in the three components, starting first with the tibia,  impacting it and removing excess methacrylate, followed by the femur, doing  the same.  I held the knee in extension while we then glued in the patella  and held it with the  patellar holding clamp.  When the methacrylate had  hardened, we removed some small amounts of methacrylate from around the  components and went through another trial reduction and found that a 12  posterior-stabilized insert was the ideal fit.  After irrigating well once  again and making sure the tray was free of debris, we placed the final 12 mm  spacer, reduced the knee, and found that it had excellent motion and good  stability.  The patella did not require a lateral release.  A Hemovac was  then placed, and we closed the wound with interrupted #1 Vicryl in two  layers in the quadriceps tendon and two layers distally with one in the  __________ and one in the capsule.  The subcutaneous tissue was closed with  2-0 Vicryl and the skin with staples.  Betadine and Adaptic dry sterile  dressing were applied.  The tourniquet was released.  He tolerated the  procedure well and after placement of the knee immobilizer was taken to the  recovery room in satisfactory condition with no known complications.  No  blood loss.      JA/MEDQ  D:  02/20/2004  T:  02/20/2004  Job:  [id number]

## 2010-06-28 NOTE — Op Note (Signed)
NAMENORBERTO, WISHON               ACCOUNT NO.:  [id number]   MEDICAL RECORD NO.:  [id number]          PATIENT TYPE:  INP   LOCATION:  3025                         FACILITY:  [facility]   PHYSICIAN:  Donalee Citrin, M.D.        DATE OF BIRTH:  01/08/1935   DATE OF PROCEDURE:  12/19/2005  DATE OF DISCHARGE:  12/20/2005                                 OPERATIVE REPORT   PREOPERATIVE DIAGNOSIS:  Cervical spondylytic radiculopathy, C5 and C6 from  severe cervical spondylosis with spondylotic compression of the C5 and C6  nerve roots at C4-5 and C5-6.   PROCEDURE:  Anterior cervical diskectomies and fusion at C4-5 and C5-6 using  two 7 mm Synthes bone graft wedges at C4-5 and C5-6, a 40 mm Venture plate  and six 13 mm __________ screws.   SURGEON:  Donalee Citrin, M.D.   ASSISTANT:  None.   ANESTHESIA:  General endotracheal.   HISTORY OF PRESENT ILLNESS:  The patient is a very pleasant, 76 year old  gentleman who has had progressive worsening neck and right arm pain that has  been refractory to all forms of conservative treatment with physical therapy  and oral steroids.  Patient had some sort of weakness of his right deltoid  and biceps. Preoperatively MRI scan showed severe spondylytic compression of  the right C5 and right C6 nerve roots  due to cervical spondylosis at C4-5  and C5-6.  Due to the patient's failure of conservative treatment, visual  examination, and imaging findings, the patient was recommended anterior  cervical diskectomy and fusion.  The risks and benefits were explained to  the patient.  He understands and agrees with this.   DESCRIPTION OF PROCEDURE:  The patient was brought to the OR and was induced  under general anesthesia.  He was positioned supine.  Neck was slightly  extended  and placed 10 pounds of halter traction.  The right side of his  neck was prepped and draped in the routine, sterile fashion.  Localized the  C5 vertebral body.  A curvilinear incision was made  just off the midline of  the sternocleidomastoid.  The superficial layer of the platysma was  dissected and divided longitudinally.  The avascular between the  sternocleidomastoid and the strap muscles was developed down to the  preperitoneal fascia.  The preperitoneal fascia was dissected with  Kittner's.  Intraoperative x-ray confirmed localization of the C5-6 disk  space.  We used a 10-blade scalpel to mark the disk space.  Then at this  point the longus coli was reflected laterally and a self-retaining retractor  was placed.  Large spondylytic ridges were noted on the anterior cervical  spine.  These were bitten off with Leksell rongeur to even off the anterior  vertebral body line.  Both interspaces were identified and incised.  Large  anterior osteophytes were bitten off with 2 and 3 mm Kerrison punch at both  the C4 vertebral body and C5 vertebral body.  Then using a high speed drill,  both interspaces were drilled down to the posterior annulus and posterior  osteophytic complexes.  First at C5-6 the operating microscope was draped,  brought into the field and under microscopic illumination, the interspace  was further drilled down.  Very large osteophyte coming off the C5 vertebral  body was drilled down and then underbitten with a 1 mm Kerrison punch  exposing hypertrophied posterior ligament.  This was all underbitten with a  1-2 mm Kerrison punch exposing the central canal and then this was marched  laterally to identify proximal C6 neural foramina bilaterally.  The right C6  nerve was noted to be marked compression and severe uncinate hypertrophy and  so the uncovertebral joint was underbitten, skeletonized in the C6 nerve  root on the right and left, but predominantly on the right.  The C6 pedicle  was identified.  The neural foramina was palpated. When it was free and  clear and decompressed, the endplates were scraped.  Gelfoam was placed.  Attention was taken to the C4-5.   At C4-5 this procedure was performed in a  similar fashion.  A large central osteophyte at C4 was underbitten.  Severe  uncinate hypertrophy was again noted overlying the C5 nerve root.  This was  all underbitten with a 1-2 Kerrison punch, skeletonizing the C5 nerve root  distally out to the foramen.  The C5 pedicle was palpated.  A nerve hook was  used to explore the foramen, noted to be widely decompressed.  The procedure  was repeated on the left at C5.  Endplates were scraped and a 7 mm graft was  inserted there.  A 7 mm graft was also inserted at C5-6.  Then a 40 mm  Venture plate was placed.  Six 13 mm screws were placed.  All screws had  excellent purchase.  Postop fluoroscopy confirmed good position of the  screws, plate and bone grafts.  The wound was then copiously irrigated and  meticulous hemostasis was obtained.  The platysma was reapproximated with 3-  0 interrupted Vicryl and the skin was closed with running 4-0 subcuticular.  Benzoin and Steri-Strips were applied.  The patient went to the recovery  room in stable condition.  At the end of the case, the counts were correct.           ______________________________  Donalee Citrin, M.D.     GC/MEDQ  D:  12/19/2005  T:  12/20/2005  Job:  [id number]

## 2010-06-28 NOTE — Op Note (Signed)
NAME:  Theodore [name], Theodore [name] NO.:  [id number]   MEDICAL RECORD NO.:  [id number]          PATIENT TYPE:  OIB   LOCATION:  1605                         FACILITY:  [facility]   PHYSICIAN:  Jene Every, M.D.    DATE OF BIRTH:  1934-07-31   DATE OF PROCEDURE:  05/29/2005  DATE OF DISCHARGE:                                 OPERATIVE REPORT   PREOPERATIVE DIAGNOSIS:  Quadriceps tendon tear left knee.   POSTOPERATIVE DIAGNOSIS:  Quadriceps tendon tear left knee.   PROCEDURE PERFORMED:  1.  Repair of quadriceps tendon tear of the left knee.  2.  Excision of osteophyte of the patella.  3.  Evacuation of hematoma.  4.  Lavage of the knee joint.   ANESTHESIA:  General.   ASSISTANT:  Roma Schanz, P.A.   BRIEF HISTORY:  The patient is a 76 year old with a massive quadriceps  tendon tear approximately one week out.  It avulsed from the patella.  MRI  indicated that.  There is a complex tear in the meniscus, but it was not  indicated as he had no problems prior to this.  Operative intervention is  indicated for repair of the tendon with a look at the meniscus if possible.  Risks and benefits discussed including bleeding, infection, injury to  vascular structures, suboptimal range of motion, recurrent tear, need for  revision, DVT, PE, anesthetic complications, etc.   TECHNIQUE:  With the patient in supine position and after the induction of  adequate general anesthesia and 1 gram of Kefzol, the left lower extremity  was prepped and draped and exsanguinated in the usual sterile fashion.  Thigh tourniquet inflated to 300 mmHg.  Midline incision was made over the  patella in approximately the suprapatellar region down into the  infrapatellar region full thickness.  The subcutaneous tissue was dissected.  Electrocautery was utilized to achieve hemostasis.  He had a large hematoma  and this was evacuated and irrigated.  There was a massive tear avulsed off  of the patella.   There was significant tearing in multiple planes in the  quadriceps tendon.  All edges were debrided.  The knee was copiously  lavaged.  Could not get to the posterior portion of the meniscus.  With  flexion and extension, there was no palpable pop and no McMurray.  After  copious irrigation, I formed a trough in the superior portion of the patella  with a Matt Holmes rongeur.  Then three drill holes were placed from superior to  inferior parallel to each other and sparing the articular cartilage of the  joint.  Small incisions were made in the patellar ligament distally.  Passed  a suture passer through these.  Used #5 FiberWire in a baseball-type stitch  up through the quadriceps tendon of the vastus intermedialis and the rectus  femoris.  These were then threaded through the drill holes in the patella  and tied in the inferior portion of the patella with the knee in full  extension.  The patella was stabilized.  Then the tendon was oversewed with  #1 Vicryl interrupted figure-of-eight sutures, multiple.  The tendon had  been advanced into the trough with no defect noted.  There was some spurring  over the superolateral aspect of the patella.  This was excised with the  [facility] rongeur.  This was so there was better approximation of the tendon as  well.  Wound was copiously irrigated.  The tear had extended over through  the vastus medialis to lateralis, from the medial aspect of the knee to the  lateral aspect of the knee.  It was a significantly extensive and massive  tear.  I did a gentle range with the repair and it was intact without  discontinuity.  Next, I copiously irrigated and repaired the subcutaneous  tissue with 2-0 Vicryl simple sutures.  The skin was reapproximated with  staples.  The wound was  dressed sterilely.  He was placed in Adaptic and an immobilizer.  Tourniquet  was deflated prior to this and there was adequate revascularization of the  lower extremity.  The patient  tolerated the procedure well.  There were no  complications.      Jene Every, M.D.  Electronically Signed     JB/MEDQ  D:  05/29/2005  T:  05/30/2005  Job:  [id number]

## 2010-06-28 NOTE — Discharge Summary (Signed)
Theodore [name], Theodore [name]               ACCOUNT NO.:  [id number]   MEDICAL RECORD NO.:  [id number]          PATIENT TYPE:  INP   LOCATION:  0465                         FACILITY:  [facility]   PHYSICIAN:  Marlowe Kays, M.D.  DATE OF BIRTH:  18-Jun-1934   DATE OF ADMISSION:  02/20/2004  DATE OF DISCHARGE:  02/25/2004                                 DISCHARGE SUMMARY   ADMITTING DIAGNOSES:  1.  Degenerative arthritis of the right knee.  2.  Hypertension.  3.  Hypercholesterolemia.   DISCHARGE DIAGNOSES:  1.  Degenerative arthritis of the right knee.  2.  Hypertension.  3.  Hypercholesterolemia.  4.  Postoperative anemia treated with transfusion.  5.  Hyponatremia.   OPERATION:  On February 20, 2004 the patient underwent Osteonics total knee  replacement arthroplasties of the right knee all three components cemented,  Dr. Madlyn Frankel. Olin assisted.   BRIEF HISTORY:  This 76 year old white male with progressive problems  concerning his right knee has had a long history of knee problems since 76  years of age.  He has had arthroscopy in the past which has helped somewhat.  However, now he has had deteriorating condition in his knee with more and  more problems concerning ambulation, day to day activities, stairs, etc.  X-  rays showed medial shift of the femur on the tibia with abutment of the  lateral femoral condyle against the intercondylar portion of the knee.  Joint space is essentially obliterated.  Due to these positive findings and  this is an active gentleman, felt he would benefit with surgical  intervention and he was admitted for the above procedure.   COURSE IN THE HOSPITAL:  The patient tolerated the surgical procedure quite  well.  He was using morphine and Percocet for discomfort.  He was noted to  have a hyponatremia.  We restricted his water intake and offered juices,  etc.  He had no symptoms with the hyponatremia.  The patient was working  with physical therapy, total  knee protocol.  Neurovascular remained intact  in the operative extremity and he was ambulating weightbearing with therapy.  We felt that he could benefit with a home program, Genevieve Norlander was notified, and  they will continue with protocol at home.   The patient was placed on Coumadin protocol postoperatively for prevention  of DVT and will continue to do so 4 weeks after the date of surgery.  He is  to follow up with his medical doctor concerning any medical problems.   LABORATORY VALUES IN THE HOSPITAL:  Hematologically showed a CBC  preoperatively with a hemoglobin of 15.4, hematocrit was 45.1.  After  transfusion his hemoglobin came up to 11.4, hematocrit was 32.5.  Blood  chemistries showed a mild hyponatremia preoperatively.  With the treatment  mentioned above, his sodium came up from 128 to a final of 137.  Urinalysis  negative for urinary tract infection.  Chest x-ray was negative for acute  cardiopulmonary process.  No electrocardiogram seen on his chart.   CONDITION ON DISCHARGE:  Improved/stable.   PLAN:  The patient discharged to  his home to continue with his medications.  I told him to drink more juices and colas than water.  Dr. Simonne Come  discharged the patient with home instructions to return to the office on  March 04, 2004 and given a prescription for Coumadin, Demerol, Darvocet  for mild pain, and Ambien for sleep.  He may continue with weightbearing as  home as instructed.      DLU/MEDQ  D:  03/11/2004  T:  03/11/2004  Job:  [id number]   cc:   Cassell Clement, M.D.  [address]  [place]  [place] [address]  Fax: [phone]

## 2010-06-28 NOTE — H&P (Signed)
NAME:  MACIEJ, SCHWEITZER NO.:  [id number]   MEDICAL RECORD NO.:  [id number]          PATIENT TYPE:  INP   LOCATION:  NA                           FACILITY:  [facility]   PHYSICIAN:  Marlowe Kays, M.D.  DATE OF BIRTH:  Apr 01, 1934   DATE OF ADMISSION:  DATE OF DISCHARGE:                                HISTORY & PHYSICAL   DATE OF ADMISSION:  February 20, 2004   CHIEF COMPLAINT:  Pain in my right knee.   PRESENT ILLNESS:  This 76 year old white male who has had right knee trouble  now for many, many years.  He first began injuring this knee back when he  was 76 years of age.  He has had several procedures to the knee, most  recently an arthroscopy by Dr. Simonne Come on May 24, 2001.  This gave him  some relief for several years, but now he has difficulty in negotiating  stairs.  His pain is primarily on the inner knee.  He has some scraping  feeling about the knee and has difficulty straightening the knee as well.  He now has an effusion to the knee.  X-rays taken primarily standing show  that he has a medial shift of the femur on the tibia with abutment of the  lateral femoral condyle against the intercondylar portion of the knee.  He  has essentially obliterated his joint space.  After much discussion, it was  felt that the patient would benefit from surgical intervention, and after  the pros and cons of surgery as well as the risks and benefits have been  explained, he was scheduled for total knee replacement of the right knee.  This will be of the Osteonics type.   PAST MEDICAL HISTORY:  Actually, this gentleman has been in relatively good  health throughout his lifetime.  He does have hypertension.  He had  rheumatic heart disease as a child.  Dr. Wanda Plump is keeping a close check  on his prostate due to his elevating PSA.  He had a kidney stone back in  1967.  Fractured wrist in 1970.  Past surgeries include right knee surgery  back in 1964, bilateral cataract  surgery in 1992, and right knee surgery by  Dr. Simonne Come (arthroscopy) in 2003.   He is allergic to PENICILLIN and current medications are:  1.  Inspra 25 mg one daily.  2.  Lipitor 40 mg one daily.  3.  __________/hydrochlorothiazide 5/6.25 mg one daily.  4.  Vitamin C 500 mg one daily.  5.  Lotrel 10/20 mg one daily.  6.  Aspirin 81 mg one daily (will stop prior to surgery).  7.  Prilosec 20 mg one daily.  8.  L-lysine 500 mg one daily.   FAMILY HISTORY:  Positive for heart disease in his brothers and his father.   SOCIAL HISTORY:  The patient is remarried.  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  No intake of tobacco products, has one to two  alcohol beverages a day, has three children.  He has a two-story home with  five steps  outside.  However, they have it set up so he can remain on the  first floor.   REVIEW OF SYSTEMS:  CNS:  No seizure disorder, paralysis, numbness, double  vision.  RESPIRATORY:  No productive cough, no hemoptysis, no shortness of  breath.  GASTROINTESTINAL:  No nausea, vomiting, melena, or bloody stools.  GENITOURINARY:  No discharge, dysuria, hematuria.  MUSCULOSKELETAL:  Primarily in present illness.   PHYSICAL EXAMINATION:  GENERAL:  Alert and cooperative, friendly 76 year old  white male.  VITAL SIGNS:  Blood pressure 128/82, pulse 60, respirations are 12.  HEENT:  Normocephalic, PERLA, EOM intact.  Oropharynx is clear.  CHEST:  Clear to auscultation, no rhonchi, no rales.  HEART:  Regular rate and rhythm, no murmurs are heard.  ABDOMEN:  Soft, nontender.  Liver and spleen not felt.  GENITALIA, RECTAL:  Not done, not pertinent to present illness.  EXTREMITIES:  Right knee as in present illness above.   ADMITTING DIAGNOSES:  1.  Degenerative arthritis of the right knee.  2.  Hypertension.  3.  Hypercholesterolemia.   PLAN:  The patient will undergo right knee replacement arthroplasty.  In all  probability, he will be able to go  home with home health.  Otherwise, we  will ask for a rehab consult.      DLU/MEDQ  D:  02/14/2004  T:  02/14/2004  Job:  [id number]   cc:   Cassell Clement, M.D.  [address]  [place]  [place] [address]  Fax: [phone]

## 2010-07-15 ENCOUNTER — Encounter (HOSPITAL_BASED_OUTPATIENT_CLINIC_OR_DEPARTMENT_OTHER): Payer: Self-pay | Admitting: Oncology

## 2010-07-15 ENCOUNTER — Other Ambulatory Visit: Payer: Self-pay | Admitting: Oncology

## 2010-07-15 DIAGNOSIS — J449 Chronic obstructive pulmonary disease, unspecified: Secondary | ICD-10-CM

## 2010-07-15 DIAGNOSIS — D473 Essential (hemorrhagic) thrombocythemia: Secondary | ICD-10-CM

## 2010-07-15 DIAGNOSIS — D696 Thrombocytopenia, unspecified: Secondary | ICD-10-CM

## 2010-07-15 DIAGNOSIS — E785 Hyperlipidemia, unspecified: Secondary | ICD-10-CM

## 2010-07-15 DIAGNOSIS — I10 Essential (primary) hypertension: Secondary | ICD-10-CM

## 2010-07-15 LAB — CBC WITH DIFFERENTIAL/PLATELET
BASO%: 0.3 % (ref 0.0–2.0)
Eosinophils Absolute: 0 10*3/uL (ref 0.0–0.5)
HCT: 42.8 % (ref 38.4–49.9)
HGB: 15 g/dL (ref 13.0–17.1)
LYMPH%: 25.6 % (ref 14.0–49.0)
MCHC: 35 g/dL (ref 32.0–36.0)
MONO#: 0.6 10*3/uL (ref 0.1–0.9)
NEUT#: 4.1 10*3/uL (ref 1.5–6.5)
NEUT%: 64.7 % (ref 39.0–75.0)
Platelets: 129 10*3/uL — ABNORMAL LOW (ref 140–400)
WBC: 6.3 10*3/uL (ref 4.0–10.3)
lymph#: 1.6 10*3/uL (ref 0.9–3.3)

## 2010-07-26 ENCOUNTER — Encounter: Payer: Self-pay | Admitting: Internal Medicine

## 2010-07-26 ENCOUNTER — Ambulatory Visit (INDEPENDENT_AMBULATORY_CARE_PROVIDER_SITE_OTHER): Payer: Medicare Other | Admitting: Internal Medicine

## 2010-07-26 VITALS — BP 120/76 | HR 62 | Ht 73.0 in | Wt 222.4 lb

## 2010-07-26 DIAGNOSIS — J40 Bronchitis, not specified as acute or chronic: Secondary | ICD-10-CM

## 2010-07-26 NOTE — Progress Notes (Signed)
  Subjective:    Patient ID: Theodore [name], male    DOB: 29-Jan-1935, 76 y.o.   MRN: [id number]  HPI 07/25/10- 62 yoF followed for bronchitis and shortness of breath, complicated by HBP, peripheral edema. Last here July 26, 2009- Reviewed PFT from that visit- FEV1/FVC 0.65. Had back surgery in Mid-November without respiratory problems. He is walking regularly. Advair really helps, used once daily. Denies acute change, cough or wheeze.  Still follows with Dr Patty Sermons.   Review of Systems Constitutional:   No weight loss, night sweats,  Fevers, chills, fatigue, lassitude. HEENT:   No headaches,  Difficulty swallowing,  Tooth/dental problems,  Sore throat,                No sneezing, itching, ear ache, nasal congestion, post nasal drip,   CV:  No chest pain, orthopnea, PND, swelling in lower extremities, anasarca, dizziness, palpitations  GI  No heartburn, indigestion, abdominal pain, nausea, vomiting, diarrhea, change in bowel habits, loss of appetite  Resp: No shortness of breath with exertion or at rest.  No excess mucus, no productive cough,  No non-productive cough,  No coughing up of blood.  No change in color of mucus.  No wheezing.  No chest wall deformity  Skin: no rash or lesions.  GU: no dysuria, change in color of urine, no urgency or frequency.  No flank pain.  MS:  No joint pain or swelling.  No decreased range of motion.  No back pain.  Psych:  No change in mood or affect. No depression or anxiety.  No memory loss.   Objective:   Physical Exam General- Alert, Oriented, Affect-appropriate, Distress- none acute  Skin- rash-none, lesions- none, excoriation- none. Tanned  Lymphadenopathy- none  Head- atraumatic  Eyes- Gross vision intact, PERRLA, conjunctivae clear secretions  Ears- Hearing, canals, Tm- normal  Nose- Clear, No- septal dev, mucus, polyps, erosion, perforation   Throat- Mallampati II , mucosa clear , drainage- none, tonsils- atrophic  Neck- flexible  , trachea midline, no stridor , thyroid nl, carotid no bruit  Chest - symmetrical excursion , unlabored     Heart/CV- RRR , no murmur , no gallop  , no rub, nl s1 s2                     - JVD- none , edema- none, stasis changes- none, varices- none     Lung- clear to P&A, wheeze- none, cough- none , dullness-none, rub- none     Chest wall-  Abd- tender-no, distended-no, bowel sounds-present, HSM- no  Br/ Gen/ Rectal- Not done, not indicated  Extrem- cyanosis- none, clubbing, none, atrophy- none, strength- nl  Neuro- grossly intact to observation        Assessment & Plan:

## 2010-07-26 NOTE — Assessment & Plan Note (Addendum)
Well maintained now on a maintenance regimen well augmented by his walking. We agreed not to make changes.

## 2010-07-26 NOTE — Patient Instructions (Signed)
Continue Advair as you are doing. Please call as needed.

## 2010-09-17 ENCOUNTER — Other Ambulatory Visit: Payer: Self-pay | Admitting: Cardiology

## 2010-09-17 ENCOUNTER — Other Ambulatory Visit: Payer: Self-pay | Admitting: Internal Medicine

## 2010-09-17 DIAGNOSIS — I119 Hypertensive heart disease without heart failure: Secondary | ICD-10-CM

## 2010-09-30 ENCOUNTER — Other Ambulatory Visit: Payer: Self-pay | Admitting: Cardiology

## 2010-09-30 DIAGNOSIS — E7800 Pure hypercholesterolemia, unspecified: Secondary | ICD-10-CM

## 2010-09-30 DIAGNOSIS — I10 Essential (primary) hypertension: Secondary | ICD-10-CM

## 2010-09-30 DIAGNOSIS — I4510 Unspecified right bundle-branch block: Secondary | ICD-10-CM

## 2010-09-30 DIAGNOSIS — E785 Hyperlipidemia, unspecified: Secondary | ICD-10-CM

## 2010-10-02 ENCOUNTER — Other Ambulatory Visit (INDEPENDENT_AMBULATORY_CARE_PROVIDER_SITE_OTHER): Payer: Medicare Other | Admitting: *Deleted

## 2010-10-02 DIAGNOSIS — I4510 Unspecified right bundle-branch block: Secondary | ICD-10-CM

## 2010-10-02 DIAGNOSIS — E785 Hyperlipidemia, unspecified: Secondary | ICD-10-CM

## 2010-10-02 DIAGNOSIS — I10 Essential (primary) hypertension: Secondary | ICD-10-CM

## 2010-10-02 DIAGNOSIS — E7800 Pure hypercholesterolemia, unspecified: Secondary | ICD-10-CM

## 2010-10-02 LAB — HEPATIC FUNCTION PANEL
ALT: 25 U/L (ref 0–53)
AST: 24 U/L (ref 0–37)
Albumin: 4.4 g/dL (ref 3.5–5.2)
Total Bilirubin: 1.3 mg/dL — ABNORMAL HIGH (ref 0.3–1.2)

## 2010-10-02 LAB — BASIC METABOLIC PANEL
CO2: 30 mEq/L (ref 19–32)
Calcium: 9.2 mg/dL (ref 8.4–10.5)
GFR: 77.62 mL/min (ref >60.00)
Potassium: 4.4 mEq/L (ref 3.5–5.1)
Sodium: 136 mEq/L (ref 135–145)

## 2010-10-02 LAB — LIPID PANEL
HDL: 55.8 mg/dL (ref >39.00)
Total CHOL/HDL Ratio: 3
Triglycerides: 69 mg/dL (ref 0.0–149.0)
VLDL: 13.8 mg/dL (ref 0.0–40.0)

## 2010-10-07 ENCOUNTER — Encounter: Payer: Self-pay | Admitting: Cardiology

## 2010-10-07 ENCOUNTER — Other Ambulatory Visit: Payer: Self-pay | Admitting: *Deleted

## 2010-10-07 ENCOUNTER — Ambulatory Visit (INDEPENDENT_AMBULATORY_CARE_PROVIDER_SITE_OTHER): Payer: Medicare Other | Admitting: Cardiology

## 2010-10-07 VITALS — BP 118/78 | HR 76 | Wt 223.0 lb

## 2010-10-07 DIAGNOSIS — I10 Essential (primary) hypertension: Secondary | ICD-10-CM

## 2010-10-07 DIAGNOSIS — K219 Gastro-esophageal reflux disease without esophagitis: Secondary | ICD-10-CM

## 2010-10-07 DIAGNOSIS — I119 Hypertensive heart disease without heart failure: Secondary | ICD-10-CM

## 2010-10-07 DIAGNOSIS — E7800 Pure hypercholesterolemia, unspecified: Secondary | ICD-10-CM

## 2010-10-07 DIAGNOSIS — E785 Hyperlipidemia, unspecified: Secondary | ICD-10-CM

## 2010-10-07 DIAGNOSIS — R0602 Shortness of breath: Secondary | ICD-10-CM

## 2010-10-07 NOTE — Assessment & Plan Note (Signed)
His blood pressure has been staying stable on his current medication

## 2010-10-07 NOTE — Assessment & Plan Note (Signed)
The patient is walking 2-1/2 miles a day for exercise.  He's not been expressing any chest pain or shortness of breath.  He has trace edema which goes down overnight.

## 2010-10-07 NOTE — Progress Notes (Signed)
Theodore [name] Date of Birth:  07/02/1934 [facility] Card[facility] / [facility] [address]   [address] [place], [place]  [address] [phone]           Fax   [phone]  History of Present Illness: This pleasant 76 year old gentleman is seen for a scheduled followup office visit.  He has a history of essential hypertension and a history of hypercholesterolemia.  He had a cardiac catheterization in 2009 after having an abnormal Cardiolite study but the cardiac catheterization showed only nonobstructive atherosclerotic disease and showed normal left ventricular function.  The patient has a history of essential hypertension which has responded well to Ziac and Lotrel.  The patient has a history of dyspnea and his echocardiogram on 09/29/07 showed normal systolic function with impaired relaxation and mild to moderate aortic insufficiency with normal pulmonary artery pressure.  Since last visit the patient has done well with no recent cardiac symptoms  Current Outpatient Prescriptions  Medication Sig Dispense Refill  . ADVAIR DISKUS 250-50 MCG/DOSE AEPB USE ONE INHALATION TWO TIMES A DAY AND RINSE  3 each  1  . amLODipine (NORVASC) 10 MG tablet TAKE 1 TABLET DAILY  90 tablet  4  . Ascorbic Acid (VITAMIN C PO) Take by mouth daily.        [name] aspirin 81 MG tablet Take 81 mg by mouth daily.        [name] atorvastatin (LIPITOR) 40 MG tablet Take 40 mg by mouth daily.        . benazepril (LOTENSIN) 40 MG tablet TAKE 1 TABLET DAILY  90 tablet  4  . bisoprolol-hydrochlorothiazide (ZIAC) 2.5-6.25 MG per tablet Take 1 tablet by mouth daily.        [name] LYSINE PO Take by mouth daily.        . Omega-3 Fatty Acids (FISH OIL) 1000 MG CAPS Take by mouth daily.        [name] omeprazole (PRILOSEC) 20 MG capsule Take 20 mg by mouth daily.          Allergies  Allergen Reactions  . Ace Inhibitors   . Lopid (Gemfibrozil)   . Niacin And Related   . Penicillins   . Tamiflu     Rash,itching  . Zocor (Simvastatin)      Patient Active Problem List  Diagnoses  . HYPERLIPIDEMIA  . HYPERTENSION  . BRONCHITIS  . DYSPNEA  . Hypertension  . Hypercholesterolemia  . Peripheral edema  . Nocturia  . Right bundle branch block    History  Smoking status  . Never Smoker   Smokeless tobacco  . Not on file    History  Alcohol Use No    Family History  Problem Relation Age of Onset  . Heart attack Father   . Heart disease Brother   . Heart disease Brother   . COPD Brother   . COPD Father   . Asthma Sister     Review of Systems: Constitutional: no fever chills diaphoresis or fatigue or change in weight.  Head and neck: no hearing loss, no epistaxis, no photophobia or visual disturbance. Respiratory: No cough, shortness of breath or wheezing. Cardiovascular: No chest pain peripheral edema, palpitations. Gastrointestinal: No abdominal distention, no abdominal pain, no change in bowel habits hematochezia or melena. Genitourinary: No dysuria, no frequency, no urgency, no nocturia. Musculoskeletal:No arthralgias, no back pain, no gait disturbance or myalgias. Neurological: No dizziness, no headaches, no numbness, no seizures, no syncope, no weakness, no tremors. Hematologic: No  lymphadenopathy, no easy bruising. Psychiatric: No confusion, no hallucinations, no sleep disturbance.    Physical Exam: Filed Vitals:   10/07/10 0847  BP: 118/78  Pulse: 76   The general appearance feels a well-developed well-nourished gentleman in no distress.The head and neck exam reveals pupils equal and reactive.  Extraocular movements are full.  There is no scleral icterus.  The mouth and pharynx are normal.  The neck is supple.  The carotids reveal no bruits.  The jugular venous pressure is normal.  The  thyroid is not enlarged.  There is no lymphadenopathy.  The chest is clear to percussion and auscultation.  There are no rales or rhonchi.  Expansion of the chest is symmetrical.  The precordium is quiet.  The  first heart sound is normal.  The second heart sound is physiologically split.  There is no murmur gallop rub or click.  There is no abnormal lift or heave.  The abdomen is soft and nontender.  The bowel sounds are normal.  The liver and spleen are not enlarged.  There are no abdominal masses.  There are no abdominal bruits.  Extremities reveal good pedal pulses.  There is no phlebitis or edema.  There is no cyanosis or clubbing.  Strength is normal and symmetrical in all extremities.  There is no lateralizing weakness.  There are no sensory deficits.  The skin is warm and dry.  There is no rash.    Assessment / Plan: Continue same medication.  Recheck in 4 months for followup office visit and fasting lab work

## 2010-10-07 NOTE — Assessment & Plan Note (Signed)
We reviewed his recent lipids which are satisfactory.  Patient continues to adhere to a food and diet.  His weight is up 3 pounds over since last visit

## 2011-01-09 ENCOUNTER — Ambulatory Visit
Admission: RE | Admit: 2011-01-09 | Discharge: 2011-01-09 | Disposition: A | Discharge status: Home / Self Care | Payer: Medicare Other | Source: Ambulatory Visit | Attending: Neurosurgery | Admitting: Neurosurgery

## 2011-01-09 ENCOUNTER — Other Ambulatory Visit: Payer: Self-pay | Admitting: Neurosurgery

## 2011-01-09 DIAGNOSIS — M4310 Spondylolisthesis, site unspecified: Secondary | ICD-10-CM

## 2011-01-09 DIAGNOSIS — M5137 Other intervertebral disc degeneration, lumbosacral region: Secondary | ICD-10-CM

## 2011-01-14 ENCOUNTER — Other Ambulatory Visit (INDEPENDENT_AMBULATORY_CARE_PROVIDER_SITE_OTHER): Payer: Medicare Other | Admitting: *Deleted

## 2011-01-14 DIAGNOSIS — E7800 Pure hypercholesterolemia, unspecified: Secondary | ICD-10-CM

## 2011-01-14 LAB — LIPID PANEL
HDL: 51.6 mg/dL (ref >39.00)
Triglycerides: 43 mg/dL (ref 0.0–149.0)
VLDL: 8.6 mg/dL (ref 0.0–40.0)

## 2011-01-14 LAB — BASIC METABOLIC PANEL
Calcium: 9.5 mg/dL (ref 8.4–10.5)
GFR: 77.56 mL/min (ref >60.00)
Potassium: 5.2 mEq/L — ABNORMAL HIGH (ref 3.5–5.1)
Sodium: 134 mEq/L — ABNORMAL LOW (ref 135–145)

## 2011-01-14 LAB — HEPATIC FUNCTION PANEL
ALT: 42 U/L (ref 0–53)
AST: 33 U/L (ref 0–37)
Albumin: 4.4 g/dL (ref 3.5–5.2)
Total Protein: 6.7 g/dL (ref 6.0–8.3)

## 2011-01-21 ENCOUNTER — Ambulatory Visit (INDEPENDENT_AMBULATORY_CARE_PROVIDER_SITE_OTHER): Payer: Medicare Other | Admitting: Cardiology

## 2011-01-21 ENCOUNTER — Encounter: Payer: Self-pay | Admitting: Cardiology

## 2011-01-21 VITALS — BP 120/70 | HR 80 | Ht 73.0 in | Wt 218.4 lb

## 2011-01-21 DIAGNOSIS — I4510 Unspecified right bundle-branch block: Secondary | ICD-10-CM

## 2011-01-21 DIAGNOSIS — I4819 Other persistent atrial fibrillation: Secondary | ICD-10-CM | POA: Insufficient documentation

## 2011-01-21 DIAGNOSIS — I480 Paroxysmal atrial fibrillation: Secondary | ICD-10-CM | POA: Insufficient documentation

## 2011-01-21 DIAGNOSIS — I10 Essential (primary) hypertension: Secondary | ICD-10-CM

## 2011-01-21 DIAGNOSIS — I4891 Unspecified atrial fibrillation: Secondary | ICD-10-CM | POA: Insufficient documentation

## 2011-01-21 DIAGNOSIS — E7800 Pure hypercholesterolemia, unspecified: Secondary | ICD-10-CM

## 2011-01-21 DIAGNOSIS — I119 Hypertensive heart disease without heart failure: Secondary | ICD-10-CM

## 2011-01-21 HISTORY — DX: Other persistent atrial fibrillation: I48.19

## 2011-01-21 LAB — CBC WITH DIFFERENTIAL/PLATELET
Basophils Absolute: 0 10*3/uL (ref 0.0–0.1)
Basophils Relative: 0.6 % (ref 0.0–3.0)
Eosinophils Relative: 0.3 % (ref 0.0–5.0)
Hemoglobin: 16.6 g/dL (ref 13.0–17.0)
Lymphocytes Relative: 33.2 % (ref 12.0–46.0)
Monocytes Relative: 10.3 % (ref 3.0–12.0)
Neutro Abs: 4.6 10*3/uL (ref 1.4–7.7)
RBC: 4.77 Mil/uL (ref 4.22–5.81)
RDW: 12.6 % (ref 11.5–14.6)
WBC: 8.2 10*3/uL (ref 4.5–10.5)

## 2011-01-21 NOTE — Progress Notes (Signed)
Theodore [name] Date of Birth:  Nov 04, 1934 [facility] Card[facility] / [facility] [address]   [address] [place], [place]  [address] [phone]           Fax   [phone]  History of Present Illness: This pleasant 76 year old gentleman is seen for a scheduled four-month followup office visit.  He has a history of essential hypertension and history of hypercholesterolemia.  He had an abnormal Cardiolite study in 2009 but a subsequent cardiac catheterization showed only nonobstructive atherosclerotic disease and showed normal left ventricular function.  The patient has a history of dyspnea and had an echocardiogram in August 2009 showing normal systolic function with impaired relaxation and mild to moderate aortic insufficiency with normal pulmonary artery pressure.  The patient does not have any prior history of atrial fibrillation.  The patient has a history of previous severe low back problems and had surgery in November 2011 by Dr. Wynetta [name].  He states that his back is feeling well and he has been released to full activities including playing golf.  Current Outpatient Prescriptions  Medication Sig Dispense Refill  . ADVAIR DISKUS 250-50 MCG/DOSE AEPB USE ONE INHALATION TWO TIMES A DAY AND RINSE  3 each  1  . amLODipine (NORVASC) 10 MG tablet TAKE 1 TABLET DAILY  90 tablet  4  . Ascorbic Acid (VITAMIN C PO) Take by mouth daily.        [name] aspirin 81 MG tablet Take 81 mg by mouth daily.        [name] atorvastatin (LIPITOR) 40 MG tablet Take 40 mg by mouth daily.        . benazepril (LOTENSIN) 40 MG tablet TAKE 1 TABLET DAILY  90 tablet  4  . bisoprolol-hydrochlorothiazide (ZIAC) 2.5-6.25 MG per tablet Take 1 tablet by mouth daily.        . dabigatran (PRADAXA) 150 MG CAPS Take 150 mg by mouth every 12 (twelve) hours.        [name] LYSINE PO Take by mouth daily.        . Omega-3 Fatty Acids (FISH OIL) 1000 MG CAPS Take by mouth daily.        [name] omeprazole (PRILOSEC) 20 MG capsule Take 20 mg by mouth  daily.          Allergies  Allergen Reactions  . Ace Inhibitors   . Lopid (Gemfibrozil)   . Niacin And Related   . Penicillins   . Tamiflu     Rash,itching  . Zocor (Simvastatin)     Patient Active Problem List  Diagnoses  . HYPERLIPIDEMIA  . HYPERTENSION  . BRONCHITIS  . DYSPNEA  . Hypertension  . Hypercholesterolemia  . Peripheral edema  . Nocturia  . Right bundle branch block    History  Smoking status  . Never Smoker   Smokeless tobacco  . Not on file    History  Alcohol Use No    Family History  Problem Relation Age of Onset  . Heart attack Father   . Heart disease Brother   . Heart disease Brother   . COPD Brother   . COPD Father   . Asthma Sister     Review of Systems: Constitutional: no fever chills diaphoresis or fatigue or change in weight.  Head and neck: no hearing loss, no epistaxis, no photophobia or visual disturbance. Respiratory: No cough, shortness of breath or wheezing. Cardiovascular: No chest pain peripheral edema, palpitations. Gastrointestinal: No abdominal distention, no abdominal pain, no change  in bowel habits hematochezia or melena. Genitourinary: No dysuria, no frequency, no urgency, no nocturia. Musculoskeletal:No arthralgias, no back pain, no gait disturbance or myalgias. Neurological: No dizziness, no headaches, no numbness, no seizures, no syncope, no weakness, no tremors. Hematologic: No lymphadenopathy, no easy bruising. Psychiatric: No confusion, no hallucinations, no sleep disturbance.    Physical Exam: Filed Vitals:   01/21/11 0937  BP: 120/70  Pulse: 80   The general appearance reveals a well-developed well-nourished gentleman in no distress.  He was surprised to find that he was in atrial fibrillation.Pupils equal and reactive.   Extraocular Movements are full.  There is no scleral icterus.  The mouth and pharynx are normal.  The neck is supple.  The carotids reveal no bruits.  The jugular venous pressure is  normal.  The thyroid is not enlarged.  There is no lymphadenopathy.  The chest is clear to percussion and auscultation. There are no rales or rhonchi. Expansion of the chest is symmetrical.  The precordium is quiet.  The first heart sound is normal.  The second heart sound is physiologically split.  There is no murmur gallop rub or click.  There is no abnormal lift or heave.  The rhythm is irregular but not rapid. The abdomen is soft and nontender. Bowel sounds are normal. The liver and spleen are not enlarged. There Are no abdominal masses. There are no bruits.  The pedal pulses are good.  There is no phlebitis or edema.  There is no cyanosis or clubbing. Strength is normal and symmetrical in all extremities.  There is no lateralizing weakness.  There are no sensory deficits.  Integument there is no rash  The EKG today shows atrial fibrillation with a controlled ventricular response, left axis deviation and right bundle branch block which are old.    Assessment / Plan: Theodore [name] therapeutic doses of pradaxa 150 mg twice a day.  We will have him return in about 3 weeks for followup office visit and EKG.  If he is still in atrial fibrillation we will make plans for subsequent elective cardioversion.  In the meantime we will update his echocardiogram.

## 2011-01-21 NOTE — Assessment & Plan Note (Signed)
EKG today shows atrial fibrillation with a controlled ventricular response.  The duration of the atrial fibrillation is not known.  Recently the patient had been aware that his heart rate had been a little faster, in the 80s instead of in the 60s.  The patient has not experienced any increased dyspnea and has had no chest pain.  He has been walking for exercise.  He has not had any TIA symptoms.  He has been taking a baby aspirin.  For his newly discovered atrial fib we will start him on pradaxa 150 mg twice a day and we will have him get a two-dimensional echocardiogram.

## 2011-01-21 NOTE — Assessment & Plan Note (Signed)
The patient has not been having symptoms of congestive heart failure.  No headaches or dizziness from his blood pressure.  No TIA symptoms.

## 2011-01-21 NOTE — Patient Instructions (Signed)
Will start Pradaxa 150 mg twice daily, samples given Will obtain labs today and call with the results Your physician has requested that you have an echocardiogram. Echocardiography is a painless test that uses sound waves to create images of your heart. It provides your doctor with information about the size and shape of your heart and how well your heart's chambers and valves are working. This procedure takes approximately one hour. There are no restrictions for this procedure.  Follow up Feb 12, 2011 at 10:30

## 2011-01-21 NOTE — Assessment & Plan Note (Signed)
His recent blood work was satisfactory and he is to continue same dose of Lipitor 40 mg daily.  He is not having any myalgias from the statin therapy.

## 2011-01-22 ENCOUNTER — Telehealth: Payer: Self-pay | Admitting: Cardiology

## 2011-01-22 ENCOUNTER — Telehealth: Payer: Self-pay | Admitting: *Deleted

## 2011-01-22 NOTE — Telephone Encounter (Signed)
Advised patient

## 2011-01-22 NOTE — Telephone Encounter (Signed)
Fu call °Pt returning your call  °

## 2011-01-22 NOTE — Telephone Encounter (Signed)
Pt needs to give you some info he forgot to give yesterday

## 2011-01-22 NOTE — Telephone Encounter (Signed)
Message copied by Burnell [name] on Wed Jan 22, 2011  1:36 PM ------      Message from: Cassell Clement      Created: Tue Jan 21, 2011  9:15 PM       No anemia.  Thyroid normal.

## 2011-01-22 NOTE — Telephone Encounter (Signed)
Okay to continue taking the Aleve.  Okay to continue taking the pradaxa since platelets are normal now and Pradaxa does not affect the platelets

## 2011-01-22 NOTE — Telephone Encounter (Signed)
Left message

## 2011-01-22 NOTE — Telephone Encounter (Signed)
Advised of labs 

## 2011-01-22 NOTE — Telephone Encounter (Signed)
Spoke with patient and he forgot to tell  Dr. Patty Sermons a couple of things at office visit yesterday.  He takes 2 Aleve daily.  Also, he has been seeing Dr Truett Perna low platelets for over a year.  Platelets within normal limits yesterday.  Do you want to make any changes or continue current medications?

## 2011-01-23 ENCOUNTER — Ambulatory Visit (HOSPITAL_COMMUNITY): Payer: Medicare Other | Attending: Cardiovascular Disease

## 2011-01-23 DIAGNOSIS — E785 Hyperlipidemia, unspecified: Secondary | ICD-10-CM | POA: Insufficient documentation

## 2011-01-23 DIAGNOSIS — I4891 Unspecified atrial fibrillation: Secondary | ICD-10-CM | POA: Insufficient documentation

## 2011-01-23 DIAGNOSIS — I10 Essential (primary) hypertension: Secondary | ICD-10-CM | POA: Insufficient documentation

## 2011-01-23 DIAGNOSIS — I379 Nonrheumatic pulmonary valve disorder, unspecified: Secondary | ICD-10-CM | POA: Insufficient documentation

## 2011-01-23 DIAGNOSIS — I079 Rheumatic tricuspid valve disease, unspecified: Secondary | ICD-10-CM | POA: Insufficient documentation

## 2011-01-23 DIAGNOSIS — I359 Nonrheumatic aortic valve disorder, unspecified: Secondary | ICD-10-CM | POA: Insufficient documentation

## 2011-01-23 DIAGNOSIS — I059 Rheumatic mitral valve disease, unspecified: Secondary | ICD-10-CM | POA: Insufficient documentation

## 2011-01-24 ENCOUNTER — Telehealth: Payer: Self-pay | Admitting: *Deleted

## 2011-01-24 NOTE — Telephone Encounter (Signed)
Message copied by Lorayne Bender on Fri Jan 24, 2011  5:34 PM ------      Message from: Cassell Clement      Created: Thu Jan 23, 2011  8:48 PM       Please report. 2D echo okay. Goood LV function. No clots seen.

## 2011-01-24 NOTE — Telephone Encounter (Signed)
Notified of echo results.

## 2011-02-11 DIAGNOSIS — I480 Paroxysmal atrial fibrillation: Secondary | ICD-10-CM

## 2011-02-11 HISTORY — PX: TRICEPS TENDON REPAIR: SHX2577

## 2011-02-11 HISTORY — PX: COLONOSCOPY: SHX174

## 2011-02-11 HISTORY — DX: Paroxysmal atrial fibrillation: I48.0

## 2011-02-12 ENCOUNTER — Encounter: Payer: Self-pay | Admitting: Cardiology

## 2011-02-12 ENCOUNTER — Ambulatory Visit (INDEPENDENT_AMBULATORY_CARE_PROVIDER_SITE_OTHER): Payer: Medicare Other | Admitting: Cardiology

## 2011-02-12 ENCOUNTER — Ambulatory Visit (INDEPENDENT_AMBULATORY_CARE_PROVIDER_SITE_OTHER)
Admission: RE | Admit: 2011-02-12 | Discharge: 2011-02-12 | Disposition: A | Discharge status: Home / Self Care | Payer: Medicare Other | Source: Ambulatory Visit | Attending: Cardiology | Admitting: Cardiology

## 2011-02-12 DIAGNOSIS — M6580 Other synovitis and tenosynovitis, unspecified site: Secondary | ICD-10-CM

## 2011-02-12 DIAGNOSIS — I4891 Unspecified atrial fibrillation: Secondary | ICD-10-CM

## 2011-02-12 DIAGNOSIS — M778 Other enthesopathies, not elsewhere classified: Secondary | ICD-10-CM | POA: Insufficient documentation

## 2011-02-12 DIAGNOSIS — I10 Essential (primary) hypertension: Secondary | ICD-10-CM

## 2011-02-12 LAB — CBC WITH DIFFERENTIAL/PLATELET
Basophils Relative: 0.2 % (ref 0.0–3.0)
Eosinophils Relative: 0.3 % (ref 0.0–5.0)
HCT: 47.2 % (ref 39.0–52.0)
Hemoglobin: 16.2 g/dL (ref 13.0–17.0)
Lymphs Abs: 1.9 10*3/uL (ref 0.7–4.0)
Monocytes Relative: 9.7 % (ref 3.0–12.0)
Platelets: 156 10*3/uL (ref 150.0–400.0)
RBC: 4.61 Mil/uL (ref 4.22–5.81)
WBC: 7.7 10*3/uL (ref 4.5–10.5)

## 2011-02-12 LAB — BASIC METABOLIC PANEL
CO2: 31 mEq/L (ref 19–32)
Calcium: 9.4 mg/dL (ref 8.4–10.5)
Glucose, Bld: 96 mg/dL (ref 70–99)
Sodium: 136 mEq/L (ref 135–145)

## 2011-02-12 LAB — MAGNESIUM: Magnesium: 1.8 mg/dL (ref 1.5–2.5)

## 2011-02-12 LAB — APTT: aPTT: 40.4 s — ABNORMAL HIGH (ref 21.7–28.8)

## 2011-02-12 MED ORDER — METOPROLOL TARTRATE 25 MG PO TABS: ORAL_TABLET | ORAL | Status: DC

## 2011-02-12 NOTE — Assessment & Plan Note (Signed)
Patient has a history of high blood pressure.  He has not been expressing any headaches or dizzy spells or symptoms from his blood pressure.

## 2011-02-12 NOTE — Assessment & Plan Note (Signed)
The patient has severe recurrent tendinitis and bursitis of his left elbow.  It prevents him from playing golf.  He has been told by his orthopedic surgeon, Dr. Orlan Leavens, that he would definitely need surgery on his elbow.  We will need to keep him on anticoagulation following his cardioversion for at least a month before proceeding with elective elbow surgery.  His cardioversion is scheduled for Friday, January 11

## 2011-02-12 NOTE — Assessment & Plan Note (Signed)
EKG shows ongoing atrial fibrillation with a ventricular response of 85.  We are going to add a low dose of generic Lopressor 12.5 mg twice a day for additional rate control and to help hold him in sinus rhythm after his cardioversion.  We will set up the elective direct-current cardioversion for Friday, 02/21/2011 at 2 PM and he will arrive at 12 PM.  He is getting lab work today and a chest x-ray today.

## 2011-02-12 NOTE — Progress Notes (Signed)
Theodore [name] Date of Birth:  1934-04-19 [facility] / [facility] [address]   [address] [place], [place]  [address] [phone]           Fax   [phone]  History of Present Illness: This pleasant 77 year old gentleman is seen for 3 week followup office visit.  On his last visit on 01/21/11 he was found to be in asymptomatic atrial fibrillation of uncertain duration.  Since then he has been on pradaxa 150 mg twice a day for the past 3 weeks.  He has not been experiencing any chest pain or increasing dyspnea.  He is aware of an occasional quivering sensation in his chest.  His echocardiogram showed good left ventricular function and no significant valve abnormalities.  Current Outpatient Prescriptions  Medication Sig Dispense Refill  . ADVAIR DISKUS 250-50 MCG/DOSE AEPB USE ONE INHALATION TWO TIMES A DAY AND RINSE  3 each  1  . amLODipine (NORVASC) 10 MG tablet TAKE 1 TABLET DAILY  90 tablet  4  . Ascorbic Acid (VITAMIN C PO) Take by mouth daily.        [name] aspirin 81 MG tablet Take 81 mg by mouth daily.        [name] atorvastatin (LIPITOR) 40 MG tablet Take 40 mg by mouth daily.        . benazepril (LOTENSIN) 40 MG tablet TAKE 1 TABLET DAILY  90 tablet  4  . bisoprolol-hydrochlorothiazide (ZIAC) 2.5-6.25 MG per tablet Take 1 tablet by mouth daily.        . dabigatran (PRADAXA) 150 MG CAPS Take 150 mg by mouth every 12 (twelve) hours.        [name] LYSINE PO Take by mouth daily.        . Omega-3 Fatty Acids (FISH OIL) 1000 MG CAPS Take by mouth daily.        [name] omeprazole (PRILOSEC) 20 MG capsule Take 20 mg by mouth daily.        . metoprolol tartrate (LOPRESSOR) 25 MG tablet 1/2 tablet twice daily  30 tablet  11    Allergies  Allergen Reactions  . Ace Inhibitors   . Lopid (Gemfibrozil)   . Niacin And Related   . Penicillins   . Tamiflu     Rash,itching  . Zocor (Simvastatin)     Patient Active Problem List  Diagnoses  . HYPERLIPIDEMIA  . HYPERTENSION  .  BRONCHITIS  . DYSPNEA  . Hypertension  . Hypercholesterolemia  . Peripheral edema  . Nocturia  . Right bundle branch block  . Campath-induced atrial fibrillation    History  Smoking status  . Never Smoker   Smokeless tobacco  . Not on file    History  Alcohol Use No    Family History  Problem Relation Age of Onset  . Heart attack Father   . Heart disease Brother   . Heart disease Brother   . COPD Brother   . COPD Father   . Asthma Sister     Review of Systems: Constitutional: no fever chills diaphoresis or fatigue or change in weight.  Head and neck: no hearing loss, no epistaxis, no photophobia or visual disturbance. Respiratory: No cough, shortness of breath or wheezing. Cardiovascular: No chest pain peripheral edema, palpitations. Gastrointestinal: No abdominal distention, no abdominal pain, no change in bowel habits hematochezia or melena. Genitourinary: No dysuria, no frequency, no urgency, no nocturia. Musculoskeletal:No arthralgias, no back pain, no gait disturbance or myalgias. Neurological: No  dizziness, no headaches, no numbness, no seizures, no syncope, no weakness, no tremors. Hematologic: No lymphadenopathy, no easy bruising. Psychiatric: No confusion, no hallucinations, no sleep disturbance.    Physical Exam: Filed Vitals:   02/12/11 1032  BP: 128/80  Pulse: 86   General appearance reveals a well-developed well-nourished gentleman in no distress.The head and neck exam reveals pupils equal and reactive.  Extraocular movements are full.  There is no scleral icterus.  The mouth and pharynx are normal.  The neck is supple.  The carotids reveal no bruits.  The jugular venous pressure is normal.  The  thyroid is not enlarged.  There is no lymphadenopathy.  The chest is clear to percussion and auscultation.  There are no rales or rhonchi.  Expansion of the chest is symmetrical.  The precordium is quiet.  The past and is irregular. The first heart sound is  normal.  The second heart sound is physiologically split.  There is no murmur gallop rub or click.  There is no abnormal lift or heave.  The abdomen is soft and nontender.  The bowel sounds are normal.  The liver and spleen are not enlarged.  There are no abdominal masses.  There are no abdominal bruits.  Extremities reveal good pedal pulses.  There is no phlebitis or edema.  There is no cyanosis or clubbing.  Strength is normal and symmetrical in all extremities.  There is no lateralizing weakness.  There are no sensory deficits.  The skin is warm and dry.  There is no rash.  EKG today shows atrial fibrillation with a heart rate of 85 and a pattern of right bundle branch block.  The right bundle branch block is old  Assessment / Plan: Continue present medication and add generic Lopressor 12.5 mg twice a day.  Orders for outpatient cardioversion were entered for Friday, January 11 and he will arrive at 12 noon for cardioversion at 2 PM.  He may take his morning medications with a sip of water.

## 2011-02-12 NOTE — Patient Instructions (Addendum)
Will start Metoprolol tartrate 25 mg 1/2 twice daily Will obtain labs today and send for chest xray Nothing to eat or drink morning of procedure, ok to take medications with a small amount of water Electrical Cardioversion Cardioversion is the delivery of a jolt of electricity to change the rhythm of the heart. Sticky patches or metal paddles are placed on the chest to deliver the electricity from a special device. This is done to restore a normal rhythm. A rhythm that is too fast or not regular keeps the heart from pumping well. Compared to medicines used to change an abnormal rhythm, cardioversion is faster and works better. It is also unpleasant and may dislodge blood clots from the heart. WHEN WOULD THIS BE DONE?  In an emergency:   There is low or no blood pressure as a result of the heart rhythm.   Normal rhythm must be restored as fast as possible to protect the brain and heart from further damage.   It may save a life.   For less serious heart rhythms, such as atrial fibrillation or flutter, in which:   The heart is beating too fast or is not regular.   The heart is still able to pump enough blood, but not as well as it should.   Medicine to change the rhythm has not worked.   It is safe to wait in order to allow time for preparation.  LET YOUR CAREGIVER KNOW ABOUT:   Every medicine you are taking. It is very important to do this! Know when to take or stop taking any of them.   Any time in the past that you have felt your heart was not beating normally.  RISKS AND COMPLICATIONS   Clots may form in the chambers of the heart if it is beating too fast. These clots may be dislodged during the procedure and travel to other parts of the body.   There is risk of a stroke during and after the procedure if a clot moves. Blood thinners lower this risk.   You may have a special test of your heart (TEE) to make sure there are no clots in your heart.  BEFORE THE PROCEDURE   You may  have some tests to see how well your heart is working.   You may start taking blood thinners so your blood does not clot as easily.   Other drugs may be given to help your heart work better.  PROCEDURE (SCHEDULED)  The procedure is typically done in a hospital by a heart doctor (cardiologist).   You will be told when and where to go.   You may be given some medicine through an intravenous (IV) access to reduce discomfort and make you sleepy before the procedure.   Your whole body may move when the shock is delivered. Your chest may feel sore.   You may be able to go home after a few hours. Your heart rhythm will be watched to make sure it does not change.  HOME CARE INSTRUCTIONS   Only take medicine as directed by your caregiver. Be sure you understand how and when to take your medicine.   Learn how to feel your pulse and check it often.   Limit your activity for 48 hours.   Avoid caffeine and other stimulants as directed.  SEEK MEDICAL CARE IF:   You feel like your heart is beating too fast or your pulse is not regular.   You have any questions about your medicines.  You have bleeding that will not stop.  SEEK IMMEDIATE MEDICAL CARE IF:   You are dizzy or feel faint.   It is hard to breathe or you feel short of breath.   There is a change in discomfort in your chest.   Your speech is slurred or you have trouble moving your arm or leg on one side.   You get a muscle cramp.   Your fingers or toes turn cold or blue.  MAKE SURE YOU:   Understand these instructions.   Will watch your condition.   Will get help right away if you are not doing well or get worse.  Document Released: 01/17/2002 Document Revised: 10/09/2010 Document Reviewed: 05/19/2007 [facility] Patient Information 2012 [place], [place].

## 2011-02-13 ENCOUNTER — Telehealth: Payer: Self-pay | Admitting: Cardiology

## 2011-02-13 ENCOUNTER — Telehealth: Payer: Self-pay | Admitting: *Deleted

## 2011-02-13 ENCOUNTER — Encounter: Payer: Self-pay | Admitting: *Deleted

## 2011-02-13 DIAGNOSIS — I4891 Unspecified atrial fibrillation: Secondary | ICD-10-CM

## 2011-02-13 MED ORDER — DABIGATRAN ETEXILATE MESYLATE 150 MG PO CAPS: 150.0000 mg | ORAL_CAPSULE | Freq: Two times a day (BID) | ORAL | Status: DC

## 2011-02-13 NOTE — Telephone Encounter (Signed)
Message copied by Burnell [name] on Thu Feb 13, 2011  1:51 PM ------      Message from: Cassell Clement      Created: Wed Feb 12, 2011  8:39 PM       Please report.  The labs are stable.  Continue same meds.  Continue careful diet.  Labs all okay for DCCV

## 2011-02-13 NOTE — Telephone Encounter (Signed)
New msg Pt had question about rx he received yesterday please call him back

## 2011-02-13 NOTE — Telephone Encounter (Signed)
Advised of xray and labs

## 2011-02-13 NOTE — Telephone Encounter (Signed)
Discussed Pradaxa and gathered samples to give at follow up ov

## 2011-02-13 NOTE — Telephone Encounter (Signed)
Message copied by Burnell [name] on Thu Feb 13, 2011  1:52 PM ------      Message from: Cassell Clement      Created: Wed Feb 12, 2011  8:39 PM       Xray okay.

## 2011-02-17 ENCOUNTER — Ambulatory Visit: Payer: Medicare Other | Admitting: Cardiology

## 2011-02-19 ENCOUNTER — Other Ambulatory Visit: Payer: Self-pay | Admitting: Nurse Practitioner

## 2011-02-19 ENCOUNTER — Telehealth: Payer: Self-pay | Admitting: Cardiology

## 2011-02-19 ENCOUNTER — Other Ambulatory Visit: Payer: Self-pay | Admitting: *Deleted

## 2011-02-19 ENCOUNTER — Encounter (HOSPITAL_COMMUNITY): Payer: Self-pay | Admitting: Respiratory Therapy

## 2011-02-19 NOTE — Telephone Encounter (Signed)
Advised patient I didn't call

## 2011-02-19 NOTE — Telephone Encounter (Signed)
Fu call °Patient returning your call °

## 2011-02-21 ENCOUNTER — Encounter (HOSPITAL_COMMUNITY)
Admission: RE | Disposition: A | Discharge status: Home / Self Care | Payer: Self-pay | Source: Ambulatory Visit | Attending: Cardiology

## 2011-02-21 ENCOUNTER — Ambulatory Visit (HOSPITAL_COMMUNITY)
Admission: RE | Admit: 2011-02-21 | Discharge: 2011-02-21 | Disposition: A | Discharge status: Home / Self Care | Payer: Medicare Other | Source: Ambulatory Visit | Attending: Cardiology | Admitting: Cardiology

## 2011-02-21 ENCOUNTER — Other Ambulatory Visit: Payer: Self-pay

## 2011-02-21 ENCOUNTER — Encounter (HOSPITAL_COMMUNITY): Payer: Self-pay | Admitting: Anesthesiology

## 2011-02-21 DIAGNOSIS — I4891 Unspecified atrial fibrillation: Secondary | ICD-10-CM

## 2011-02-21 DIAGNOSIS — Z538 Procedure and treatment not carried out for other reasons: Secondary | ICD-10-CM | POA: Insufficient documentation

## 2011-02-21 SURGERY — CARDIOVERSION
Anesthesia: General | Wound class: Clean

## 2011-02-21 MED ORDER — SODIUM CHLORIDE 0.45 % IV SOLN: INTRAVENOUS | Status: DC

## 2011-02-21 MED ORDER — HYDROCORTISONE 1 % EX CREA
1.0000 "application " | TOPICAL_CREAM | Freq: Three times a day (TID) | CUTANEOUS | Status: DC | PRN
  Filled 2011-02-21: qty 28

## 2011-02-21 NOTE — Progress Notes (Signed)
   Subjective:  Patient came to Short Stay for elective DCCV. Pre-op EKG shows NSR with PVCs.  DCCV canceled.  Objective:  Vital Signs in the last 24 hours: Temp:  [98.2 F (36.8 C)] 98.2 F (36.8 C) (01/11 1225) Pulse Rate:  [97] 97  (01/11 1225) Resp:  [20] 20  (01/11 1225) BP: (130)/(78) 130/78 mmHg (01/11 1230) SpO2:  [98 %] 98 % (01/11 1225) Weight:  [220 lb (99.791 kg)] 220 lb (99.791 kg) (01/11 1225)  Intake/Output from previous day:   Intake/Output from this shift:         . sodium chloride      Physical Exam: The patient appears to be in no distress.  Head and neck exam reveals that the pupils are equal and reactive.  The extraocular movements are full.  There is no scleral icterus.  Mouth and pharynx are benign.  No lymphadenopathy.  No carotid bruits.  The jugular venous pressure is normal.  Thyroid is not enlarged or tender.  Chest is clear to percussion and auscultation.  No rales or rhonchi.  Expansion of the chest is symmetrical.  Heart reveals no abnormal lift or heave.  First and second heart sounds are normal.  There is no murmur gallop rub or click.  The abdomen is soft and nontender.  Bowel sounds are normoactive.  There is no hepatosplenomegaly or mass.  There are no abdominal bruits.  Extremities reveal no phlebitis or edema.  Pedal pulses are good.  There is no cyanosis or clubbing.  Neurologic exam is normal strength and no lateralizing weakness.  No sensory deficits.  Integument reveals no rash  Lab Results: No results found for this basename: WBC:2,HGB:2,PLT:2 in the last 72 hours No results found for this basename: NA:2,K:2,CL:2,CO2:2,GLUCOSE:2,BUN:2,CREATININE:2 in the last 72 hours No results found for this basename: TROPONINI:2,CK,MB:2 in the last 72 hours Hepatic Function Panel No results found for this basename: PROT,ALBUMIN,AST,ALT,ALKPHOS,BILITOT,BILIDIR,IBILI in the last 72 hours No results found for this basename: CHOL in the last  72 hours No results found for this basename: PROTIME in the last 72 hours  Imaging: Imaging results have been reviewed  Cardiac Studies:  Assessment/Plan:  Patient Active Hospital Problem List:    Paroxysmal Atrial fib, resolve Plan: Home today Continue present meds including Pradaxa. Decrease Lopressor to 1/2 tab once a day because of bradycardia. Keep appt with Lawson Fiscal next Friday for OV and EKG to be sure rhythm is maintaining NSR. OK for elbow surgery in 3 more weeks. Stop Pradaxa 3 days before surgery.   LOS: 0 days    Cassell Clement 02/21/2011, 1:15 PM

## 2011-02-24 ENCOUNTER — Encounter (HOSPITAL_COMMUNITY): Payer: Self-pay | Admitting: Cardiology

## 2011-02-28 ENCOUNTER — Ambulatory Visit: Payer: Medicare Other | Admitting: Nurse Practitioner

## 2011-02-28 ENCOUNTER — Ambulatory Visit (INDEPENDENT_AMBULATORY_CARE_PROVIDER_SITE_OTHER): Payer: Medicare Other | Admitting: Nurse Practitioner

## 2011-02-28 ENCOUNTER — Encounter: Payer: Self-pay | Admitting: Nurse Practitioner

## 2011-02-28 VITALS — BP 130/70 | HR 66 | Resp 20 | Ht 73.0 in | Wt 220.0 lb

## 2011-02-28 DIAGNOSIS — I4891 Unspecified atrial fibrillation: Secondary | ICD-10-CM

## 2011-02-28 DIAGNOSIS — I10 Essential (primary) hypertension: Secondary | ICD-10-CM

## 2011-02-28 NOTE — Progress Notes (Signed)
[name] Date of Birth: 1934/06/14 Medical Record #[id number]  History of Present Illness: Mr. Mikles is seen back today for a post hospital visit. He is seen for Dr. Patty Sermons. He was here earlier in the month. Had just been placed on Pradaxa after noting atrial fib on his regular office visit. He was not really symptomatic. Did notice that his heart rate was in the 80 to 90's. Was also placed on low dose beta blocker. He presented for cardioversion last week. Was noted to be in sinus. Procedure was cancelled. His heart rate was a little slow with the beta blocker and his dose was cut back.  He comes in today. He feels fine. No symptoms. Was told to take his Pradaxa for 2 more weeks and then stop. He has been monitoring his heart rate and it has been low in the 60's. No chest pain or shortness of breath. Not dizzy or lightheaded. No palpitations.   Current Outpatient Prescriptions on File Prior to Visit  Medication Sig Dispense Refill  . amLODipine (NORVASC) 10 MG tablet Take 10 mg by mouth daily.      . Ascorbic Acid (VITAMIN C PO) Take 1 tablet by mouth daily.       [name] aspirin 81 MG tablet Take 81 mg by mouth daily.        [name] atorvastatin (LIPITOR) 40 MG tablet Take 40 mg by mouth daily.        . benazepril (LOTENSIN) 40 MG tablet Take 40 mg by mouth daily.      . bisoprolol-hydrochlorothiazide (ZIAC) 2.5-6.25 MG per tablet Take 1 tablet by mouth daily.       . dabigatran (PRADAXA) 150 MG CAPS Take 150 mg by mouth every 12 (twelve) hours.      . Fluticasone-Salmeterol (ADVAIR) 250-50 MCG/DOSE AEPB Inhale 1 puff into the lungs every 12 (twelve) hours.      [name] LYSINE PO Take 1 tablet by mouth daily.       . metoprolol tartrate (LOPRESSOR) 25 MG tablet Take 12.5 mg by mouth every morning.       . naproxen sodium (ANAPROX) 220 MG tablet Take 440 mg by mouth daily.      . Omega-3 Fatty Acids (FISH OIL) 1000 MG CAPS Take by mouth daily.        [name] omeprazole (PRILOSEC) 20 MG capsule Take 20 mg  by mouth daily.          Allergies  Allergen Reactions  . Ace Inhibitors   . Lopid (Gemfibrozil)   . Niacin And Related   . Penicillins   . Tamiflu     Rash,itching  . Zocor (Simvastatin)     Past Medical History  Diagnosis Date  . Hypertension   . Hypercholesterolemia   . SOB (shortness of breath)   . Peripheral edema   . Nocturia   . Right bundle branch block   . PAF (paroxysmal atrial fibrillation) January 2013    Placed on Pradaxa. Did not require cardioversion; spontaneously converted  . Tendonitis of elbow, left     Past Surgical History  Procedure Date  . Cardiac catheterization 2009    NONOBSTRUCTIVE ATHERSCLEROTIC CORONARY DISEASE AND NORMAL  LV FUNCTION  . Back surgery 12/24/09  . Cardioversion 02/21/2011    Procedure: CARDIOVERSION;  Surgeon: Cassell Clement, MD;  Location: [facility] OR;  Service: Cardiovascular;  Laterality: N/A;    History  Smoking status  . Never Smoker   Smokeless tobacco  . Not  on file    History  Alcohol Use No    Family History  Problem Relation Age of Onset  . Heart attack Father   . Heart disease Brother   . Heart disease Brother   . COPD Brother   . COPD Father   . Asthma Sister     Review of Systems: The review of systems is positive for tendonitis of the elbow. He has plans for surgery in a couple of weeks.  All other systems were reviewed and are negative.  Physical Exam: BP 130/70  Pulse 66  Resp 20  Ht 6\' 1"  (1.854 m)  Wt 220 lb (99.791 kg)  BMI 29.03 kg/m2 Patient is very pleasant and in no acute distress. Skin is warm and dry. Color is normal.  HEENT is unremarkable. Normocephalic/atraumatic. PERRL. Sclera are nonicteric. Neck is supple. No masses. No JVD. Lungs are clear. Cardiac exam shows a regular rate and rhythm. Abdomen is soft. Extremities are without edema. Gait and ROM are intact. No gross neurologic deficits noted.   LABORATORY DATA: EKG today shows sinus with 1st degree AV block, R BBB. Rate is  66.   Assessment / Plan:

## 2011-02-28 NOTE — Assessment & Plan Note (Signed)
Blood pressure is doing well with his current regimen.

## 2011-02-28 NOTE — Assessment & Plan Note (Signed)
He remains in sinus rhythm. Dr. Patty Sermons has already instructed him to take his Pradaxa for another 2 weeks and then stop. He is to continue to monitor his heart rate. His current CHADs score is 1 (for HTN) but he will be turning 77 later this year. He notes that for future reference, he will not be able to afford the Pradaxa. We will plan on seeing him back in 2 months. Patient is agreeable to this plan and will call if any problems develop in the interim.

## 2011-02-28 NOTE — Progress Notes (Signed)
Addended by: Rosalio Macadamia on: 02/28/2011 08:36 AM   Modules accepted: Orders

## 2011-02-28 NOTE — Patient Instructions (Signed)
Stay on your current medicines.  Finish your Pradaxa in 2 weeks. Stop for 3 days prior to your elbow surgery.  Continue to monitor your pulse and blood pressure at home.  We will see you in 2 months.  Call the [facility] office at [phone] if you have any questions, problems or concerns.

## 2011-03-10 ENCOUNTER — Telehealth: Payer: Self-pay | Admitting: Cardiology

## 2011-03-10 NOTE — Telephone Encounter (Signed)
All Cardiac faxed to [facility] @ [phone] 03/10/11/KM

## 2011-03-25 ENCOUNTER — Other Ambulatory Visit: Payer: Self-pay | Admitting: Cardiology

## 2011-04-09 ENCOUNTER — Other Ambulatory Visit: Payer: Self-pay | Admitting: *Deleted

## 2011-04-09 DIAGNOSIS — D696 Thrombocytopenia, unspecified: Secondary | ICD-10-CM

## 2011-04-10 ENCOUNTER — Other Ambulatory Visit: Payer: Medicare Other | Admitting: Lab

## 2011-04-10 ENCOUNTER — Telehealth: Payer: Self-pay | Admitting: Oncology

## 2011-04-10 ENCOUNTER — Ambulatory Visit (HOSPITAL_BASED_OUTPATIENT_CLINIC_OR_DEPARTMENT_OTHER): Payer: Medicare Other | Admitting: Oncology

## 2011-04-10 VITALS — BP 128/74 | HR 61 | Temp 98.2°F | Ht 73.0 in | Wt 221.8 lb

## 2011-04-10 DIAGNOSIS — D696 Thrombocytopenia, unspecified: Secondary | ICD-10-CM

## 2011-04-10 DIAGNOSIS — E785 Hyperlipidemia, unspecified: Secondary | ICD-10-CM

## 2011-04-10 DIAGNOSIS — I10 Essential (primary) hypertension: Secondary | ICD-10-CM

## 2011-04-10 LAB — CBC WITH DIFFERENTIAL/PLATELET
BASO%: 0.2 % (ref 0.0–2.0)
EOS%: 0.8 % (ref 0.0–7.0)
HCT: 43.8 % (ref 38.4–49.9)
LYMPH%: 38.2 % (ref 14.0–49.0)
MCH: 34 pg — ABNORMAL HIGH (ref 27.2–33.4)
MCHC: 36.3 g/dL — ABNORMAL HIGH (ref 32.0–36.0)
NEUT%: 48.9 % (ref 39.0–75.0)
Platelets: 123 10*3/uL — ABNORMAL LOW (ref 140–400)
RBC: 4.67 10*6/uL (ref 4.20–5.82)
nRBC: 0 % (ref 0–0)

## 2011-04-10 NOTE — Telephone Encounter (Signed)
gv pt appt for [id number]

## 2011-04-10 NOTE — Progress Notes (Signed)
OFFICE PROGRESS NOTE   INTERVAL HISTORY:   He returns as scheduled. He recently had left arm tendon surgery. He feels well. He denies fever, night sweats, and anorexia. He has no complaint.  Objective:  Vital signs in last 24 hours:  Blood pressure 128/74, pulse 61, temperature 98.2 F (36.8 C), temperature source Oral, height 6\' 1"  (1.854 m), weight 221 lb 12.8 oz (100.608 kg).    HEENT: Neck without mass Lymphatics: No cervical, supraclavicular, axillary, or inguinal nodes Resp: Lungs clear bilaterally Cardio: Regular rate and rhythm GI: No hepatomegaly Vascular: No leg edema    Lab Results:  Lab Results  Component Value Date   WBC 5.9 04/10/2011   HGB 15.9 04/10/2011   HCT 43.8 04/10/2011   MCV 93.8 04/10/2011   PLT 123* 04/10/2011   ANC 2.9   Medications: I have reviewed the patient's current medications.  Assessment/Plan: 1. Mild thrombocytopenia -stable 2. Lumbar fusion surgery 12/24/2009. 3. Variation in red cell size and shape on the peripheral blood smear 02/18/2010. 4. Cervical spine surgery in 2009. 5. Right knee replacement in 2006. 6. Mild chronic obstructive pulmonary disease. 7. Hypertension. 8. Hypercholesterolemia. 9. History of Mild lower extremity edema. 10. Quadriceps tendon rupture in 2007.    Disposition:  He is stable from a hematologic standpoint. 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 He will return for an office visit and CBC in one year.   Lucile Shutters, MD  04/10/2011  10:43 AM

## 2011-04-21 ENCOUNTER — Ambulatory Visit (INDEPENDENT_AMBULATORY_CARE_PROVIDER_SITE_OTHER): Payer: Medicare Other | Admitting: Cardiology

## 2011-04-21 ENCOUNTER — Encounter: Payer: Self-pay | Admitting: Cardiology

## 2011-04-21 VITALS — BP 130/80 | HR 64 | Ht 73.0 in | Wt 221.0 lb

## 2011-04-21 DIAGNOSIS — E785 Hyperlipidemia, unspecified: Secondary | ICD-10-CM

## 2011-04-21 DIAGNOSIS — E7800 Pure hypercholesterolemia, unspecified: Secondary | ICD-10-CM

## 2011-04-21 DIAGNOSIS — I119 Hypertensive heart disease without heart failure: Secondary | ICD-10-CM

## 2011-04-21 DIAGNOSIS — I10 Essential (primary) hypertension: Secondary | ICD-10-CM

## 2011-04-21 DIAGNOSIS — I4891 Unspecified atrial fibrillation: Secondary | ICD-10-CM

## 2011-04-21 NOTE — Assessment & Plan Note (Signed)
The patient has not been experiencing any side effects from his 40 mg Lipitor.

## 2011-04-21 NOTE — Patient Instructions (Signed)
Your physician recommends that you continue on your current medications as directed. Please refer to the Current Medication list given to you today. Your physician recommends that you schedule a follow-up appointment in: 3 month ov--will get lp/bmet/hfp prior to appointment

## 2011-04-21 NOTE — Assessment & Plan Note (Signed)
The patient has not had any recurrence of his atrial fibrillation.  He has been off his pradaxa since prior to his elbow surgery and he remains on just an 81 mg aspirin daily

## 2011-04-21 NOTE — Assessment & Plan Note (Signed)
The patient has not been having any chest pain or shortness of breath.  He has not been as physically active because of the brace on his left elbow which is quite heavy.  He hopes to get the brace off today after he sees his orthopedist

## 2011-04-21 NOTE — Progress Notes (Signed)
Theodore [name] Date of Birth:  08/06/1934 [facility] [address] [place], [place]  [address] [phone]         Fax   [phone]  History of Present Illness: This pleasant 77 year old gentleman is seen for a scheduled followup office visit.  He has a past history of essential hypertension and a history of hypercholesterolemia.  He also has a past history of paroxysmal atrial fibrillation.  Presently he is in normal sinus rhythm.  Since we last saw him he had successful surgery on his left elbow 5 weeks ago by Dr. Orlan Leavens.  Current Outpatient Prescriptions  Medication Sig Dispense Refill  . amLODipine (NORVASC) 10 MG tablet Take 10 mg by mouth daily.      . Ascorbic Acid (VITAMIN C PO) Take 1 tablet by mouth daily.       [name] aspirin 81 MG tablet Take 81 mg by mouth daily.        [name] atorvastatin (LIPITOR) 40 MG tablet TAKE 1 TABLET DAILY  90 tablet  2  . benazepril (LOTENSIN) 40 MG tablet Take 40 mg by mouth daily.      . bisoprolol-hydrochlorothiazide (ZIAC) 2.5-6.25 MG per tablet TAKE 1 TABLET DAILY  90 tablet  2  . Fluticasone-Salmeterol (ADVAIR) 250-50 MCG/DOSE AEPB Inhale 1 puff into the lungs every 12 (twelve) hours.      [name] LYSINE PO Take 1 tablet by mouth daily.       . metoprolol tartrate (LOPRESSOR) 25 MG tablet Take 12.5 mg by mouth every morning.       . naproxen sodium (ANAPROX) 220 MG tablet Take 440 mg by mouth daily.      [name] omeprazole (PRILOSEC) 20 MG capsule Take 20 mg by mouth daily.          Allergies  Allergen Reactions  . Ace Inhibitors   . Lopid (Gemfibrozil)   . Niacin And Related   . Penicillins   . Tamiflu     Rash,itching  . Zocor (Simvastatin)     Patient Active Problem List  Diagnoses  . HYPERLIPIDEMIA  . HYPERTENSION  . BRONCHITIS  . DYSPNEA  . Hypertension  . Hypercholesterolemia  . Peripheral edema  . Nocturia  . Right bundle branch block  . Atrial fibrillation  . Tendinitis of elbow    History  Smoking status    . Never Smoker   Smokeless tobacco  . Not on file    History  Alcohol Use No    Family History  Problem Relation Age of Onset  . Heart attack Father   . Heart disease Brother   . Heart disease Brother   . COPD Brother   . COPD Father   . Asthma Sister     Review of Systems: Constitutional: no fever chills diaphoresis or fatigue or change in weight.  Head and neck: no hearing loss, no epistaxis, no photophobia or visual disturbance. Respiratory: No cough, shortness of breath or wheezing. Cardiovascular: No chest pain peripheral edema, palpitations. Gastrointestinal: No abdominal distention, no abdominal pain, no change in bowel habits hematochezia or melena. Genitourinary: No dysuria, no frequency, no urgency, no nocturia. Musculoskeletal:No arthralgias, no back pain, no gait disturbance or myalgias. Neurological: No dizziness, no headaches, no numbness, no seizures, no syncope, no weakness, no tremors. Hematologic: No lymphadenopathy, no easy bruising. Psychiatric: No confusion, no hallucinations, no sleep disturbance.    Physical Exam: Filed Vitals:   04/21/11 0956  BP: 130/80  Pulse: 64   the  general appearance reveals a well-developed well-nourished gentleman in no distress.The head and neck exam reveals pupils equal and reactive.  Extraocular movements are full.  There is no scleral icterus.  The mouth and pharynx are normal.  The neck is supple.  The carotids reveal no bruits.  The jugular venous pressure is normal.  The  thyroid is not enlarged.  There is no lymphadenopathy.  The chest is clear to percussion and auscultation.  There are no rales or rhonchi.  Expansion of the chest is symmetrical.  The precordium is quiet.  The first heart sound is normal.  The second heart sound is physiologically split.  There is no murmur gallop rub or click.  There is no abnormal lift or heave.  The abdomen is soft and nontender.  The bowel sounds are normal.  The liver and spleen are  not enlarged.  There are no abdominal masses.  There are no abdominal bruits.  Extremities reveal good pedal pulses.  There is no phlebitis or edema.  There is no cyanosis or clubbing.  Strength is normal and symmetrical in all extremities.  There is no lateralizing weakness.  There are no sensory deficits.  The skin is warm and dry.  There is no rash.     Assessment / Plan: Patient is doing well.  Continue same medication.  Recheck in 3 months for followup office visit and he'll get fasting lipid panel hepatic function panel and basal metabolic panel ahead of time

## 2011-05-21 ENCOUNTER — Ambulatory Visit (INDEPENDENT_AMBULATORY_CARE_PROVIDER_SITE_OTHER): Payer: Medicare Other | Admitting: Nurse Practitioner

## 2011-05-21 ENCOUNTER — Telehealth: Payer: Self-pay | Admitting: Cardiology

## 2011-05-21 ENCOUNTER — Encounter: Payer: Self-pay | Admitting: Nurse Practitioner

## 2011-05-21 VITALS — BP 132/90 | HR 69 | Ht 73.0 in | Wt 221.0 lb

## 2011-05-21 DIAGNOSIS — I480 Paroxysmal atrial fibrillation: Secondary | ICD-10-CM

## 2011-05-21 DIAGNOSIS — I4891 Unspecified atrial fibrillation: Secondary | ICD-10-CM

## 2011-05-21 MED ORDER — METOPROLOL TARTRATE 25 MG PO TABS: 25.0000 mg | ORAL_TABLET | Freq: Two times a day (BID) | ORAL | Status: DC

## 2011-05-21 NOTE — Telephone Encounter (Signed)
I can see today at 11;30

## 2011-05-21 NOTE — Telephone Encounter (Signed)
Scheduled appointment and advised patient

## 2011-05-21 NOTE — Patient Instructions (Signed)
Stop the Ziac  Take the metoprolol 25 mg two times a day  Keep a check of your heart rate and your pulse and keep a diary  I will see you in a month.  Call the [facility] office at [phone] if you have any questions, problems or concerns.

## 2011-05-21 NOTE — Progress Notes (Signed)
Theodore [name] Date of Birth: 08-06-1934 Medical Record #[id number]  History of Present Illness: Theodore [name] is seen back today for a work in visit. He is seen for Dr. Patty Sermons. He has PAF, HTN and HLD. He has been on Pradaxa earlier this year for recurrent atrial fib. He was set up for cardioversion, but converted on his own. He is off of his anticoagulation and is just on aspirin.   He comes in today. He called earlier this morning and spoke with [facility]. Noted that his heart was beating irregular last night. This morning his blood pressure was quite high and his heart rate was 100. This is very unusual for him. He has been under more stress over the past few days. His father in law passed away and there was a very emotional funeral this past Monday. He also did not sleep well last night. He had had 2 beers but this is not unusual for him. He has been on just low dose metoprolol tartrate. He is also on Ziac. No chest pain. No swelling. Remains active.   Current Outpatient Prescriptions on File Prior to Visit  Medication Sig Dispense Refill  . amLODipine (NORVASC) 10 MG tablet Take 10 mg by mouth daily.      . Ascorbic Acid (VITAMIN C PO) Take 1 tablet by mouth daily.       [name] aspirin 81 MG tablet Take 81 mg by mouth daily.        [name] atorvastatin (LIPITOR) 40 MG tablet TAKE 1 TABLET DAILY  90 tablet  2  . benazepril (LOTENSIN) 40 MG tablet Take 40 mg by mouth daily.      . Fluticasone-Salmeterol (ADVAIR) 250-50 MCG/DOSE AEPB Inhale 1 puff into the lungs every 12 (twelve) hours.      [name] LYSINE PO Take 1 tablet by mouth daily.       . naproxen sodium (ANAPROX) 220 MG tablet Take 440 mg by mouth daily.      [name] omeprazole (PRILOSEC) 20 MG capsule Take 20 mg by mouth daily.        [name] DISCONTD: metoprolol tartrate (LOPRESSOR) 25 MG tablet Take 12.5 mg by mouth every morning.         Allergies  Allergen Reactions  . Ace Inhibitors   . Lopid (Gemfibrozil)   . Niacin And Related   . Penicillins   .  Tamiflu     Rash,itching  . Zocor (Simvastatin)     Past Medical History  Diagnosis Date  . Hypertension   . Hypercholesterolemia   . SOB (shortness of breath)   . Peripheral edema   . Nocturia   . Right bundle branch block   . PAF (paroxysmal atrial fibrillation) January 2013    Placed on Pradaxa. Did not require cardioversion; spontaneously converted  . Tendonitis of elbow, left     Past Surgical History  Procedure Date  . Cardiac catheterization 2009    NONOBSTRUCTIVE ATHERSCLEROTIC CORONARY DISEASE AND NORMAL  LV FUNCTION  . Back surgery 12/24/09  . Cardioversion 02/21/2011    Procedure: CARDIOVERSION;  Surgeon: Cassell Clement, MD;  Location: [facility] OR;  Service: Cardiovascular;  Laterality: N/A;    History  Smoking status  . Never Smoker   Smokeless tobacco  . Not on file    History  Alcohol Use No    Family History  Problem Relation Age of Onset  . Heart attack Father   . Heart disease Brother   . Heart disease Brother   .  COPD Brother   . COPD Father   . Asthma Sister     Review of Systems: The review of systems is per the HPI.  All other systems were reviewed and are negative.  Physical Exam: BP 132/90  Pulse 69  Ht 6\' 1"  (1.854 m)  Wt 221 lb (100.245 kg)  BMI 29.16 kg/m2 Patient is very pleasant and in no acute distress. Skin is warm and dry. Color is normal.  HEENT is unremarkable. Normocephalic/atraumatic. PERRL. Sclera are nonicteric. Neck is supple. No masses. No JVD. Lungs are clear. Cardiac exam shows a regular rate and rhythm. Abdomen is soft. Extremities are without edema. Gait and ROM are intact. No gross neurologic deficits noted.   LABORATORY DATA: EKG today shows sinus rhythm. Rate is 69. He does have a R BBB.   Lab Results  Component Value Date   WBC 5.9 04/10/2011   HGB 15.9 04/10/2011   HCT 43.8 04/10/2011   PLT 123* 04/10/2011   GLUCOSE 96 02/12/2011   CHOL 142 01/14/2011   TRIG 43.0 01/14/2011   HDL 51.60 01/14/2011   LDLCALC 82  01/14/2011   ALT 42 01/14/2011   AST 33 01/14/2011   NA 136 02/12/2011   K 4.5 02/12/2011   CL 98 02/12/2011   CREATININE 1.0 02/12/2011   BUN 14 02/12/2011   CO2 31 02/12/2011   TSH 1.11 01/21/2011   INR 1.2* 02/12/2011     Assessment / Plan:

## 2011-05-21 NOTE — Telephone Encounter (Signed)
Thinks he is back in Afib.  Heart beating irregular and at times heart rate 90-100.  Is no longer taking Pradaxa since he went back into NSR.  Does take Metoprolol tart. 25 mg 1/2 in the morning.  Will forward to [facility] NP for review

## 2011-05-21 NOTE — Telephone Encounter (Signed)
New msg Pt said he wants to talk to you about his heart being in afib again. Please call

## 2011-05-21 NOTE — Assessment & Plan Note (Signed)
Patient is currently in sinus rhythm. Probably had a short lived episode of atrial fib. Probably stress-induced and due to lack of sleep. He remains on aspirin. CHADs is only a 1 for HTN. I have stopped his Ziac. Will increase his metoprolol back to 25 mg BID. He will continue to monitor his heart rate and blood pressure at home. Will tentatively see him back in a month. If he continues to have recurrent episodes, may need to consider antiarrhythmic therapy as well as further anticoagulation. Patient is agreeable to this plan and will call if any problems develop in the interim.

## 2011-06-03 ENCOUNTER — Telehealth: Payer: Self-pay | Admitting: Internal Medicine

## 2011-06-03 MED ORDER — PREDNISONE 10 MG PO TABS: ORAL_TABLET | ORAL | Status: DC

## 2011-06-03 NOTE — Telephone Encounter (Signed)
Called, spoke with pt.  I informed him CDY recs he take prednisone 10 mg take 4 x 2 days,3 x 2 days, 2 x 2 days, 1 x 2 days , then stop.  Advised rx will be sent to [facility] [address] and to call back if symptoms do not improve or seek emergency care if needed.  He verbalized understanding of these results and recs and voiced no further questions/concerns at this time.

## 2011-06-03 NOTE — Telephone Encounter (Signed)
Per CY-okay to give patient Prednisone 10 mg #20 take 4 x 2 days, 3 x 2 days, 2 x 2 days, 1 x 2 days, then stop no refills.  

## 2011-06-03 NOTE — Telephone Encounter (Signed)
Called, spoke with pt who c/o chest congestion, wheezing "all the time," coughing with a small amount of tan mucus, some head congestion, and increased SOB x 10 days. Denies f/c/s.  Is taking delsym which is helping some and has been taking mucinex x 5 days with no relief.  He has a pending OV with Dr. Maple Hudson in June.  Requesting recs.  Dr. Maple Hudson, pls advise.  Thank you.  [facility] [address]  Allergies verified  Allergies  Allergen Reactions  . Ace Inhibitors   . Lopid (Gemfibrozil)   . Niacin And Related   . Penicillins   . Tamiflu     Rash,itching  . Zocor (Simvastatin)

## 2011-06-20 ENCOUNTER — Ambulatory Visit (INDEPENDENT_AMBULATORY_CARE_PROVIDER_SITE_OTHER): Payer: Medicare Other | Admitting: Nurse Practitioner

## 2011-06-20 ENCOUNTER — Encounter: Payer: Self-pay | Admitting: Nurse Practitioner

## 2011-06-20 VITALS — BP 160/82 | HR 65 | Ht 73.0 in | Wt 222.0 lb

## 2011-06-20 DIAGNOSIS — I4891 Unspecified atrial fibrillation: Secondary | ICD-10-CM

## 2011-06-20 DIAGNOSIS — I10 Essential (primary) hypertension: Secondary | ICD-10-CM

## 2011-06-20 MED ORDER — HYDROCHLOROTHIAZIDE 25 MG PO TABS: 12.5000 mg | ORAL_TABLET | Freq: Every day | ORAL | Status: DC

## 2011-06-20 NOTE — Patient Instructions (Signed)
We are adding back the HCTZ at 25 mg - take just a half of this daily.  Monitor your blood pressure at home and record your readings.  Watch your salt  We will see you back in June as scheduled with lab work  Call the [facility] office at [phone] if you have any questions, problems or concerns.

## 2011-06-20 NOTE — Assessment & Plan Note (Signed)
Blood pressure is back up. I have restarted his HCTZ at 25 mg. He will start with just using a half of a tablet. He will continue to monitor his blood pressure at home. He has follow up here next month with Dr. Patty Sermons with labs. He will call for any problems in the interim.

## 2011-06-20 NOTE — Assessment & Plan Note (Signed)
Rhythm has been good. No bradycardia noted. Will continue with the metoprolol.

## 2011-06-20 NOTE — Progress Notes (Signed)
[name] Date of Birth: May 17, 1934 Medical Record #[id number]  History of Present Illness: Mr. Guderian is seen back today for a one month check. He is seen for Dr. Patty Sermons. He has PAF, HTN and HLD. He thought he had had a recurrent episode of atrial fib when I saw him last month. We stopped his Ziac and increased his Metoprolol.   He comes back today for follow up. He is here alone. He is doing well. Rhythm has been good but blood pressure is back up. He does note more swelling in his fingers and hands. No chest pain. Not short of breath. No recurrence of atrial fib.   Current Outpatient Prescriptions on File Prior to Visit  Medication Sig Dispense Refill  . amLODipine (NORVASC) 10 MG tablet Take 10 mg by mouth daily.      . Ascorbic Acid (VITAMIN C PO) Take 1 tablet by mouth daily.       [name] aspirin 81 MG tablet Take 81 mg by mouth daily.        [name] atorvastatin (LIPITOR) 40 MG tablet TAKE 1 TABLET DAILY  90 tablet  2  . benazepril (LOTENSIN) 40 MG tablet Take 40 mg by mouth daily.      . Fluticasone-Salmeterol (ADVAIR) 250-50 MCG/DOSE AEPB Inhale 1 puff into the lungs every 12 (twelve) hours.      [name] LYSINE PO Take 1 tablet by mouth daily.       . metoprolol tartrate (LOPRESSOR) 25 MG tablet Take 1 tablet (25 mg total) by mouth 2 (two) times daily.  60 tablet  6  . naproxen sodium (ANAPROX) 220 MG tablet Take 440 mg by mouth daily.      [name] omeprazole (PRILOSEC) 20 MG capsule Take 20 mg by mouth daily.        . predniSONE (DELTASONE) 10 MG tablet take 4 x 2 days,3 x 2 days, 2 x 2 days, 1 x 2 days , then stop  20 tablet  0    Allergies  Allergen Reactions  . Ace Inhibitors   . Lopid (Gemfibrozil)   . Niacin And Related   . Penicillins   . Tamiflu     Rash,itching  . Zocor (Simvastatin)     Past Medical History  Diagnosis Date  . Hypertension   . Hypercholesterolemia   . SOB (shortness of breath)   . Peripheral edema   . Nocturia   . Right bundle branch block   . PAF  (paroxysmal atrial fibrillation) January 2013    Placed on Pradaxa. Did not require cardioversion; spontaneously converted  . Tendonitis of elbow, left     Past Surgical History  Procedure Date  . Cardiac catheterization 2009    NONOBSTRUCTIVE ATHERSCLEROTIC CORONARY DISEASE AND NORMAL  LV FUNCTION  . Back surgery 12/24/09  . Cardioversion 02/21/2011    Procedure: CARDIOVERSION;  Surgeon: Cassell Clement, MD;  Location: [facility] OR;  Service: Cardiovascular;  Laterality: N/A;    History  Smoking status  . Never Smoker   Smokeless tobacco  . Not on file    History  Alcohol Use No    Family History  Problem Relation Age of Onset  . Heart attack Father   . Heart disease Brother   . Heart disease Brother   . COPD Brother   . COPD Father   . Asthma Sister     Review of Systems: The review of systems is per the HPI.  All other systems were reviewed and are  negative.  Physical Exam: BP 160/82  Pulse 65  Ht 6\' 1"  (1.854 m)  Wt 222 lb (100.699 kg)  BMI 29.29 kg/m2 Patient is very pleasant and in no acute distress. Skin is warm and dry. Color is normal.  HEENT is unremarkable. Normocephalic/atraumatic. PERRL. Sclera are nonicteric. Neck is supple. No masses. No JVD. Lungs are clear. Cardiac exam shows a regular rate and rhythm. Abdomen is soft. Extremities are without edema. Gait and ROM are intact. No gross neurologic deficits noted.  LABORATORY DATA: N/A   Assessment / Plan:

## 2011-07-15 ENCOUNTER — Other Ambulatory Visit: Payer: Self-pay | Admitting: Gastroenterology

## 2011-07-30 ENCOUNTER — Ambulatory Visit: Payer: Medicare Other | Admitting: Internal Medicine

## 2011-08-01 ENCOUNTER — Other Ambulatory Visit (INDEPENDENT_AMBULATORY_CARE_PROVIDER_SITE_OTHER): Payer: Medicare Other

## 2011-08-01 DIAGNOSIS — E7800 Pure hypercholesterolemia, unspecified: Secondary | ICD-10-CM

## 2011-08-01 DIAGNOSIS — I119 Hypertensive heart disease without heart failure: Secondary | ICD-10-CM

## 2011-08-01 LAB — LIPID PANEL
Cholesterol: 137 mg/dL (ref 0–200)
HDL: 56.1 mg/dL (ref >39.00)
LDL Cholesterol: 71 mg/dL (ref 0–99)
Triglycerides: 48 mg/dL (ref 0.0–149.0)
VLDL: 9.6 mg/dL (ref 0.0–40.0)

## 2011-08-01 LAB — HEPATIC FUNCTION PANEL
Albumin: 4.3 g/dL (ref 3.5–5.2)
Total Protein: 6.5 g/dL (ref 6.0–8.3)

## 2011-08-01 LAB — BASIC METABOLIC PANEL
Chloride: 100 mEq/L (ref 96–112)
Potassium: 4.2 mEq/L (ref 3.5–5.1)
Sodium: 135 mEq/L (ref 135–145)

## 2011-08-03 NOTE — Progress Notes (Signed)
Quick Note:  Please make copy of labs for patient visit. ______ 

## 2011-08-08 ENCOUNTER — Ambulatory Visit (INDEPENDENT_AMBULATORY_CARE_PROVIDER_SITE_OTHER): Payer: Medicare Other | Admitting: Cardiology

## 2011-08-08 ENCOUNTER — Encounter: Payer: Self-pay | Admitting: Cardiology

## 2011-08-08 VITALS — BP 130/70 | HR 60 | Ht 73.0 in | Wt 221.0 lb

## 2011-08-08 DIAGNOSIS — E785 Hyperlipidemia, unspecified: Secondary | ICD-10-CM

## 2011-08-08 DIAGNOSIS — I119 Hypertensive heart disease without heart failure: Secondary | ICD-10-CM

## 2011-08-08 DIAGNOSIS — I480 Paroxysmal atrial fibrillation: Secondary | ICD-10-CM

## 2011-08-08 DIAGNOSIS — I4891 Unspecified atrial fibrillation: Secondary | ICD-10-CM

## 2011-08-08 DIAGNOSIS — I10 Essential (primary) hypertension: Secondary | ICD-10-CM

## 2011-08-08 DIAGNOSIS — E7800 Pure hypercholesterolemia, unspecified: Secondary | ICD-10-CM

## 2011-08-08 MED ORDER — METOPROLOL TARTRATE 25 MG PO TABS: ORAL_TABLET | ORAL | Status: DC

## 2011-08-08 MED ORDER — HYDROCHLOROTHIAZIDE 25 MG PO TABS: 25.0000 mg | ORAL_TABLET | Freq: Every day | ORAL | Status: DC

## 2011-08-08 NOTE — Assessment & Plan Note (Signed)
Systolic blood pressure is remaining high.  He has been taking metoprolol 25 mg twice a day.  He has also been on amlodipine and benazepril and hydrochlorothiazide in good doses.  He has not been having a problems with bradycardia.  We will increase his metoprolol tartrate to 50 mg in the morning and 25 mg in the evening

## 2011-08-08 NOTE — Assessment & Plan Note (Signed)
The patient has been aware of no recurrent atrial fibrillation.  He has not had any TIA or stroke symptoms.

## 2011-08-08 NOTE — Assessment & Plan Note (Signed)
He is on atorvastatin 40 mg daily.  He has not been having any myalgias from the statin therapy.  Results of statin therapy are good today

## 2011-08-08 NOTE — Patient Instructions (Addendum)
Increase your Metoprolol 25 mg to 2 in the morning and 1 in the evening  Your physician wants you to follow-up in: 4 months with fasting labs (lp/bmet/hfp)  You will receive a reminder letter in the mail two months in advance. If you don't receive a letter, please call our office to schedule the follow-up appointment.

## 2011-08-08 NOTE — Progress Notes (Signed)
Theodore [name] Date of Birth:  11/07/1934 [facility] [address] [place], [place]  [address] [phone]         Fax   [phone]  History of Present Illness: This pleasant 77 year old gentleman is seen for a scheduled followup office visit.  He has a past history of paroxysmal atrial fibrillation and a history of essential hypertension and dyslipidemia.  His last visit he has been doing well but his systolic blood pressures have been remaining elevated.  He had lab work last week which we went over today which is excellent.  Current Outpatient Prescriptions  Medication Sig Dispense Refill  . amLODipine (NORVASC) 10 MG tablet Take 10 mg by mouth daily.      . Ascorbic Acid (VITAMIN C PO) Take 1 tablet by mouth daily.       [name] aspirin 81 MG tablet Take 81 mg by mouth daily.        [name] atorvastatin (LIPITOR) 40 MG tablet TAKE 1 TABLET DAILY  90 tablet  2  . benazepril (LOTENSIN) 40 MG tablet Take 40 mg by mouth daily.      . Fluticasone-Salmeterol (ADVAIR) 250-50 MCG/DOSE AEPB Inhale 1 puff into the lungs every 12 (twelve) hours.      . hydrochlorothiazide (HYDRODIURIL) 25 MG tablet Take 1 tablet (25 mg total) by mouth daily.  90 tablet  3  . LYSINE PO Take 1 tablet by mouth daily.       . metoprolol tartrate (LOPRESSOR) 25 MG tablet 2 tablets in the morning and 1 in the evening  270 tablet  3  . naproxen sodium (ANAPROX) 220 MG tablet Take 440 mg by mouth daily.      [name] omeprazole (PRILOSEC) 20 MG capsule Take 20 mg by mouth daily.        [name] DISCONTD: hydrochlorothiazide (HYDRODIURIL) 25 MG tablet Take 0.5 tablets (12.5 mg total) by mouth daily.  90 tablet  3  . DISCONTD: hydrochlorothiazide (HYDRODIURIL) 25 MG tablet Take 25 mg by mouth daily.      [name] DISCONTD: metoprolol tartrate (LOPRESSOR) 25 MG tablet Take 1 tablet (25 mg total) by mouth 2 (two) times daily.  60 tablet  6  . triamcinolone (KENALOG) 0.1 % paste as directed.        Allergies  Allergen Reactions    . Ace Inhibitors   . Lopid (Gemfibrozil)   . Niacin And Related   . Penicillins   . Tamiflu     Rash,itching  . Zocor (Simvastatin)     Patient Active Problem List  Diagnosis  . HYPERLIPIDEMIA  . HYPERTENSION  . BRONCHITIS  . DYSPNEA  . Hypertension  . Hypercholesterolemia  . Peripheral edema  . Nocturia  . Right bundle branch block  . Atrial fibrillation  . Tendinitis of elbow    History  Smoking status  . Never Smoker   Smokeless tobacco  . Not on file    History  Alcohol Use No    Family History  Problem Relation Age of Onset  . Heart attack Father   . Heart disease Brother   . Heart disease Brother   . COPD Brother   . COPD Father   . Asthma Sister     Review of Systems: Constitutional: no fever chills diaphoresis or fatigue or change in weight.  Head and neck: no hearing loss, no epistaxis, no photophobia or visual disturbance. Respiratory: No cough, shortness of breath or wheezing. Cardiovascular: No chest pain peripheral edema,  palpitations. Gastrointestinal: No abdominal distention, no abdominal pain, no change in bowel habits hematochezia or melena. Genitourinary: No dysuria, no frequency, no urgency, no nocturia. Musculoskeletal:No arthralgias, no back pain, no gait disturbance or myalgias. Neurological: No dizziness, no headaches, no numbness, no seizures, no syncope, no weakness, no tremors. Hematologic: No lymphadenopathy, no easy bruising. Psychiatric: No confusion, no hallucinations, no sleep disturbance.    Physical Exam: Filed Vitals:   08/08/11 0840  BP: 130/70  Pulse: 60   the general appearance reveals a well-developed well-nourished gentleman in no distress.  His blood pressure today here was lower than it has been running at home.Pupils equal and reactive.   Extraocular Movements are full.  There is no scleral icterus.  The mouth and pharynx are normal.  The neck is supple.  The carotids reveal no bruits.  The jugular venous  pressure is normal.  The thyroid is not enlarged.  There is no lymphadenopathy.  The chest is clear to percussion and auscultation. There are no rales or rhonchi. Expansion of the chest is symmetrical.  The precordium is quiet.  The first heart sound is normal.  The second heart sound is physiologically split.  There is no murmur gallop rub or click.  There is no abnormal lift or heave.  The abdomen is soft and nontender. Bowel sounds are normal. The liver and spleen are not enlarged. There Are no abdominal masses. There are no bruits.  The pedal pulses are good.  There is no phlebitis or edema.  There is no cyanosis or clubbing. Strength is normal and symmetrical in all extremities.  There is no lateralizing weakness.  There are no sensory deficits.     Assessment / Plan: Continue same medication except increase metoprolol to 50 mg in the morning and 20 5 in the evening to help with blood pressure and to prevent recurrent atrial fib.  Recheck in 4 months for followup office visit and fasting lab work

## 2011-08-19 ENCOUNTER — Ambulatory Visit (INDEPENDENT_AMBULATORY_CARE_PROVIDER_SITE_OTHER): Payer: Medicare Other | Admitting: Internal Medicine

## 2011-08-19 ENCOUNTER — Encounter: Payer: Self-pay | Admitting: Internal Medicine

## 2011-08-19 VITALS — BP 118/80 | HR 57 | Ht 73.0 in | Wt 224.2 lb

## 2011-08-19 DIAGNOSIS — J45909 Unspecified asthma, uncomplicated: Secondary | ICD-10-CM

## 2011-08-19 DIAGNOSIS — J45998 Other asthma: Secondary | ICD-10-CM

## 2011-08-19 DIAGNOSIS — K219 Gastro-esophageal reflux disease without esophagitis: Secondary | ICD-10-CM

## 2011-08-19 MED ORDER — FLUTICASONE-SALMETEROL 250-50 MCG/DOSE IN AEPB: 1.0000 | INHALATION_SPRAY | Freq: Two times a day (BID) | RESPIRATORY_TRACT | Status: DC

## 2011-08-19 NOTE — Progress Notes (Signed)
  Subjective:    Patient ID: Theodore [name], male    DOB: 10-05-1934, 77 y.o.   MRN: [id number]  HPI 07/25/10- 39 yoF followed for bronchitis and shortness of breath, complicated by HBP, peripheral edema. Last here July 26, 2009- Reviewed PFT from that visit- FEV1/FVC 0.65. Had back surgery in Mid-November without respiratory problems. He is walking regularly. Advair really helps, used once daily. Denies acute change, cough or wheeze.  Still follows with Dr Patty Sermons.   08/19/11- 75 yoF followed for bronchitis and shortness of breath, complicated by HBP, peripheral edema. Slight SOB with weather as humid as it is; denies any cough, congestion, or wheezing. He had no problem with anesthesia for a triceps repair. Advair doing well. Denies need for rescue inhaler. Cannot skip omeprazole for control of GERD CXR 02/23/11- IMPRESSION:  No acute cardiopulmonary disease.  Original Report Authenticated By: Consuello Bossier, M.D.   ROS-see HPI Constitutional:   No-   weight loss, night sweats, fevers, chills, fatigue, lassitude. HEENT:   No-  headaches, difficulty swallowing, tooth/dental problems, sore throat,       No-  sneezing, itching, ear ache, nasal congestion, post nasal drip,  CV:  No-   chest pain, orthopnea, PND, swelling in lower extremities, anasarca, dizziness, palpitations Resp: No-   shortness of breath with exertion or at rest.              No-   productive cough,  No non-productive cough,  No- coughing up of blood.              No-   change in color of mucus.  No- wheezing.   Skin: No-   rash or lesions. GI:  No-   heartburn, indigestion, abdominal pain, nausea, vomiting,  GU:  MS:  No-   joint pain or swelling.  Neuro-     nothing unusual Psych:  No- change in mood or affect. No depression or anxiety.  No memory loss.  OBJ- Physical Exam General- Alert, Oriented, Affect-appropriate, Distress- none acute, looks fit and trim Skin- rash-none, lesions- none, excoriation- none.  Tanned Lymphadenopathy- none Head- atraumatic            Eyes- Gross vision intact, PERRLA, conjunctivae and secretions clear            Ears- Hearing, canals-normal            Nose- Clear, no-Septal dev, mucus, polyps, erosion, perforation             Throat- Mallampati II , mucosa clear , drainage- none, tonsils- atrophic Neck- flexible , trachea midline, no stridor , thyroid nl, carotid no bruit Chest - symmetrical excursion , unlabored           Heart/CV- RRR , no murmur , no gallop  , no rub, nl s1 s2                           - JVD- none , edema- none, stasis changes- none, varices- none           Lung- clear to P&A, wheeze- none, cough- none , dullness-none, rub- none           Chest wall-  Abd-  Br/ Gen/ Rectal- Not done, not indicated Extrem- cyanosis- none, clubbing, none, atrophy- none, strength- nl Neuro- grossly intact to observation

## 2011-08-19 NOTE — Patient Instructions (Addendum)
Advair refill sent to [facility] to continue as you are doing. Please call as needed

## 2011-08-21 ENCOUNTER — Encounter: Payer: Self-pay | Admitting: Cardiology

## 2011-08-26 DIAGNOSIS — K219 Gastro-esophageal reflux disease without esophagitis: Secondary | ICD-10-CM | POA: Insufficient documentation

## 2011-08-26 DIAGNOSIS — J45998 Other asthma: Secondary | ICD-10-CM | POA: Insufficient documentation

## 2011-08-26 NOTE — Assessment & Plan Note (Signed)
Controlled but dependent on maintenance omeprazole

## 2011-08-26 NOTE — Assessment & Plan Note (Signed)
Now well controlled using maintenance inhaler. He does not want to pay for a rescue inhaler he never uses.

## 2011-11-12 ENCOUNTER — Other Ambulatory Visit: Payer: Self-pay | Admitting: Neurosurgery

## 2011-11-12 DIAGNOSIS — M25551 Pain in right hip: Secondary | ICD-10-CM

## 2011-11-13 ENCOUNTER — Ambulatory Visit
Admission: RE | Admit: 2011-11-13 | Discharge: 2011-11-13 | Disposition: A | Discharge status: Home / Self Care | Payer: Medicare Other | Source: Ambulatory Visit | Attending: Neurosurgery | Admitting: Neurosurgery

## 2011-11-13 DIAGNOSIS — M25551 Pain in right hip: Secondary | ICD-10-CM

## 2011-12-02 ENCOUNTER — Other Ambulatory Visit (INDEPENDENT_AMBULATORY_CARE_PROVIDER_SITE_OTHER): Payer: Medicare Other

## 2011-12-02 DIAGNOSIS — I119 Hypertensive heart disease without heart failure: Secondary | ICD-10-CM

## 2011-12-02 DIAGNOSIS — E7800 Pure hypercholesterolemia, unspecified: Secondary | ICD-10-CM

## 2011-12-02 LAB — HEPATIC FUNCTION PANEL
ALT: 27 U/L (ref 0–53)
Alkaline Phosphatase: 51 U/L (ref 39–117)
Bilirubin, Direct: 0.1 mg/dL (ref 0.0–0.3)
Total Bilirubin: 0.9 mg/dL (ref 0.3–1.2)
Total Protein: 6.5 g/dL (ref 6.0–8.3)

## 2011-12-02 LAB — LIPID PANEL
Cholesterol: 140 mg/dL (ref 0–200)
HDL: 52.5 mg/dL (ref >39.00)
LDL Cholesterol: 77 mg/dL (ref 0–99)

## 2011-12-02 LAB — BASIC METABOLIC PANEL
BUN: 15 mg/dL (ref 6–23)
Calcium: 9.1 mg/dL (ref 8.4–10.5)
Creatinine, Ser: 1 mg/dL (ref 0.4–1.5)
GFR: 79.2 mL/min (ref >60.00)

## 2011-12-02 NOTE — Progress Notes (Signed)
Quick Note:  Please make copy of labs for patient visit. ______ 

## 2011-12-09 ENCOUNTER — Encounter: Payer: Self-pay | Admitting: Cardiology

## 2011-12-09 ENCOUNTER — Ambulatory Visit (INDEPENDENT_AMBULATORY_CARE_PROVIDER_SITE_OTHER): Payer: Medicare Other | Admitting: Cardiology

## 2011-12-09 VITALS — BP 130/80 | HR 60 | Resp 18 | Ht 73.0 in | Wt 220.4 lb

## 2011-12-09 DIAGNOSIS — E785 Hyperlipidemia, unspecified: Secondary | ICD-10-CM

## 2011-12-09 DIAGNOSIS — I119 Hypertensive heart disease without heart failure: Secondary | ICD-10-CM

## 2011-12-09 DIAGNOSIS — R0602 Shortness of breath: Secondary | ICD-10-CM

## 2011-12-09 DIAGNOSIS — E7800 Pure hypercholesterolemia, unspecified: Secondary | ICD-10-CM

## 2011-12-09 DIAGNOSIS — I10 Essential (primary) hypertension: Secondary | ICD-10-CM

## 2011-12-09 DIAGNOSIS — I4891 Unspecified atrial fibrillation: Secondary | ICD-10-CM

## 2011-12-09 MED ORDER — AMLODIPINE BESYLATE 10 MG PO TABS: 10.0000 mg | ORAL_TABLET | Freq: Every day | ORAL | Status: DC

## 2011-12-09 MED ORDER — ATORVASTATIN CALCIUM 40 MG PO TABS: 40.0000 mg | ORAL_TABLET | Freq: Every day | ORAL | Status: DC

## 2011-12-09 MED ORDER — BENAZEPRIL HCL 40 MG PO TABS: 40.0000 mg | ORAL_TABLET | Freq: Every day | ORAL | Status: DC

## 2011-12-09 NOTE — Assessment & Plan Note (Signed)
The patient has not been experiencing any significant exertional dyspnea.  He still plays golf on a regular basis.

## 2011-12-09 NOTE — Assessment & Plan Note (Signed)
Blood work is satisfactory today he will continue on current medication

## 2011-12-09 NOTE — Assessment & Plan Note (Signed)
He checks his blood pressure at home and it has been in good range

## 2011-12-09 NOTE — Assessment & Plan Note (Signed)
The patient has had no recurrent atrial fibrillation.  He remains on aspirin 81 mg daily.

## 2011-12-09 NOTE — Patient Instructions (Addendum)
Your physician recommends that you schedule a follow-up appointment in: 4 months with fasting labs (lp/bmet/hfp)  Your physician recommends that you continue on your current medications as directed. Please refer to the Current Medication list given to you today.

## 2011-12-09 NOTE — Progress Notes (Signed)
Theodore [name] Date of Birth:  01/05/1935 [facility] [address] [place], [place]  [address] [phone]         Fax   [phone]  History of Present Illness: This pleasant 77 year old gentleman is seen for a scheduled followup office visit. He has a past history of paroxysmal atrial fibrillation and a history of essential hypertension and dyslipidemia. His last visit he has been doing well but his systolic blood pressures have been remaining elevated. He had lab work last week which we went over today which is excellent.  Since last visit he has had no new cardiac symptoms.  He has been having more problems with his back.  He had back surgery several years ago.  He is now having some sciatic pain on the right side   Current Outpatient Prescriptions  Medication Sig Dispense Refill  . Ascorbic Acid (VITAMIN C PO) Take 1 tablet by mouth daily.       [name] aspirin 81 MG tablet Take 81 mg by mouth daily.        . Fluticasone-Salmeterol (ADVAIR) 250-50 MCG/DOSE AEPB Inhale 1 puff into the lungs every 12 (twelve) hours.  180 each  3  . hydrochlorothiazide (HYDRODIURIL) 25 MG tablet Take 1 tablet (25 mg total) by mouth daily.  90 tablet  3  . LYSINE PO Take 1 tablet by mouth daily.       . metoprolol tartrate (LOPRESSOR) 25 MG tablet 2 tablets in the morning and 1 in the evening  270 tablet  3  . naproxen sodium (ANAPROX) 220 MG tablet Take 440 mg by mouth daily.      [name] omeprazole (PRILOSEC) 20 MG capsule Take 20 mg by mouth daily.        [name] triamcinolone (KENALOG) 0.1 % paste as directed.      [name] DISCONTD: amLODipine (NORVASC) 10 MG tablet Take 10 mg by mouth daily.      [name] DISCONTD: atorvastatin (LIPITOR) 40 MG tablet TAKE 1 TABLET DAILY  90 tablet  2  . DISCONTD: benazepril (LOTENSIN) 40 MG tablet Take 40 mg by mouth daily.      [name] amLODipine (NORVASC) 10 MG tablet Take 1 tablet (10 mg total) by mouth daily.  90 tablet  3  . atorvastatin (LIPITOR) 40 MG tablet Take 1 tablet  (40 mg total) by mouth daily.  90 tablet  2  . benazepril (LOTENSIN) 40 MG tablet Take 1 tablet (40 mg total) by mouth daily.  90 tablet  3    Allergies  Allergen Reactions  . Ace Inhibitors   . Lopid (Gemfibrozil)   . Niacin And Related   . Penicillins   . Tamiflu     Rash,itching  . Zocor (Simvastatin)     Patient Active Problem List  Diagnosis  . HYPERLIPIDEMIA  . HYPERTENSION  . BRONCHITIS  . DYSPNEA  . Hypertension  . Hypercholesterolemia  . Peripheral edema  . Nocturia  . Right bundle branch block  . Atrial fibrillation  . Tendinitis of elbow  . Allergic-infective asthma  . GERD (gastroesophageal reflux disease)    History  Smoking status  . Never Smoker   Smokeless tobacco  . Not on file    History  Alcohol Use No    Family History  Problem Relation Age of Onset  . Heart attack Father   . Heart disease Brother   . Heart disease Brother   . COPD Brother   . COPD Father   .  Asthma Sister     Review of Systems: Constitutional: no fever chills diaphoresis or fatigue or change in weight.  Head and neck: no hearing loss, no epistaxis, no photophobia or visual disturbance. Respiratory: No cough, shortness of breath or wheezing. Cardiovascular: No chest pain peripheral edema, palpitations. Gastrointestinal: No abdominal distention, no abdominal pain, no change in bowel habits hematochezia or melena. Genitourinary: No dysuria, no frequency, no urgency, no nocturia. Musculoskeletal:No arthralgias, no back pain, no gait disturbance or myalgias. Neurological: No dizziness, no headaches, no numbness, no seizures, no syncope, no weakness, no tremors. Hematologic: No lymphadenopathy, no easy bruising. Psychiatric: No confusion, no hallucinations, no sleep disturbance.    Physical Exam: Filed Vitals:   12/09/11 0937  BP: 130/80  Pulse: 60  Resp: 18   the general appearance reveals a well-developed well-nourished gentleman in no distress.The head and  neck exam reveals pupils equal and reactive.  Extraocular movements are full.  There is no scleral icterus.  The mouth and pharynx are normal.  The neck is supple.  The carotids reveal no bruits.  The jugular venous pressure is normal.  The  thyroid is not enlarged.  There is no lymphadenopathy.  The chest is clear to percussion and auscultation.  There are no rales or rhonchi.  Expansion of the chest is symmetrical.  The precordium is quiet.  The first heart sound is normal.  The second heart sound is physiologically split.  There is no murmur gallop rub or click.  There is no abnormal lift or heave.  The abdomen is soft and nontender.  The bowel sounds are normal.  The liver and spleen are not enlarged.  There are no abdominal masses.  There are no abdominal bruits.  Extremities reveal good pedal pulses.  There is no phlebitis or edema.  There is no cyanosis or clubbing.  Strength is normal and symmetrical in all extremities.  There is no lateralizing weakness.  There are no sensory deficits.  The skin is warm and dry.  There is no rash.  EKG shows sinus rhythm with first degree AV block and right bundle branch block.  There are occasional premature atrial beats with aberration   Assessment / Plan: Continue same medication.  Recheck in 4 months for followup office visit and fasting lab work.  Continue back exercises to help his sciatica.

## 2011-12-29 DIAGNOSIS — N401 Enlarged prostate with lower urinary tract symptoms: Secondary | ICD-10-CM | POA: Insufficient documentation

## 2011-12-29 DIAGNOSIS — E291 Testicular hypofunction: Secondary | ICD-10-CM | POA: Insufficient documentation

## 2011-12-29 DIAGNOSIS — R6882 Decreased libido: Secondary | ICD-10-CM | POA: Insufficient documentation

## 2011-12-29 DIAGNOSIS — N529 Male erectile dysfunction, unspecified: Secondary | ICD-10-CM | POA: Insufficient documentation

## 2012-03-27 ENCOUNTER — Other Ambulatory Visit: Payer: Self-pay

## 2012-04-07 ENCOUNTER — Other Ambulatory Visit (INDEPENDENT_AMBULATORY_CARE_PROVIDER_SITE_OTHER): Payer: Medicare Other

## 2012-04-07 DIAGNOSIS — E7800 Pure hypercholesterolemia, unspecified: Secondary | ICD-10-CM

## 2012-04-07 DIAGNOSIS — D696 Thrombocytopenia, unspecified: Secondary | ICD-10-CM

## 2012-04-07 DIAGNOSIS — I119 Hypertensive heart disease without heart failure: Secondary | ICD-10-CM

## 2012-04-07 LAB — LIPID PANEL
Cholesterol: 140 mg/dL (ref 0–200)
LDL Cholesterol: 82 mg/dL (ref 0–99)
Total CHOL/HDL Ratio: 3

## 2012-04-07 LAB — BASIC METABOLIC PANEL
BUN: 12 mg/dL (ref 6–23)
CO2: 29 mEq/L (ref 19–32)
Chloride: 96 mEq/L (ref 96–112)
Creatinine, Ser: 1 mg/dL (ref 0.4–1.5)
Glucose, Bld: 99 mg/dL (ref 70–99)

## 2012-04-07 LAB — HEPATIC FUNCTION PANEL
Alkaline Phosphatase: 59 U/L (ref 39–117)
Bilirubin, Direct: 0.1 mg/dL (ref 0.0–0.3)
Total Bilirubin: 0.9 mg/dL (ref 0.3–1.2)

## 2012-04-07 NOTE — Progress Notes (Signed)
Quick Note:  Please make copy of labs for patient visit. ______ 

## 2012-04-08 ENCOUNTER — Other Ambulatory Visit (HOSPITAL_BASED_OUTPATIENT_CLINIC_OR_DEPARTMENT_OTHER): Payer: Medicare Other | Admitting: Lab

## 2012-04-08 ENCOUNTER — Ambulatory Visit (HOSPITAL_BASED_OUTPATIENT_CLINIC_OR_DEPARTMENT_OTHER): Payer: Medicare Other | Admitting: Oncology

## 2012-04-08 ENCOUNTER — Telehealth: Payer: Self-pay | Admitting: Oncology

## 2012-04-08 VITALS — BP 135/85 | HR 67 | Temp 97.9°F | Resp 20 | Ht 73.0 in | Wt 219.4 lb

## 2012-04-08 DIAGNOSIS — D696 Thrombocytopenia, unspecified: Secondary | ICD-10-CM

## 2012-04-08 DIAGNOSIS — I10 Essential (primary) hypertension: Secondary | ICD-10-CM

## 2012-04-08 LAB — CBC WITH DIFFERENTIAL/PLATELET
BASO%: 0.3 % (ref 0.0–2.0)
Basophils Absolute: 0 10*3/uL (ref 0.0–0.1)
MCHC: 36.1 g/dL — ABNORMAL HIGH (ref 32.0–36.0)
MCV: 96.2 fL (ref 79.3–98.0)
MONO#: 0.6 10*3/uL (ref 0.1–0.9)
RBC: 4.5 10*6/uL (ref 4.20–5.82)
RDW: 12.5 % (ref 11.0–14.6)
WBC: 6.6 10*3/uL (ref 4.0–10.3)
lymph#: 1.9 10*3/uL (ref 0.9–3.3)

## 2012-04-08 NOTE — Progress Notes (Signed)
   [facility]    OFFICE PROGRESS NOTE   INTERVAL HISTORY:   He returns as scheduled. He feels well. No fever, night sweats, or anorexia. Good appetite. He complains of intermittent hoarseness over the past several weeks. No change in the chronic exertional dyspnea.  Objective:  Vital signs in last 24 hours:  Blood pressure 135/85, pulse 67, temperature 97.9 F (36.6 C), temperature source Oral, resp. rate 20, height 6\' 1"  (1.854 m), weight 219 lb 6.4 oz (99.519 kg).    HEENT: Oropharynx without visible mass, neck without mass Lymphatics: No cervical, supraclavicular, or axillary nodes Resp: Lungs clear bilaterally Cardio: Regular rate and rhythm GI: No hepatosplenomegaly Vascular: No leg edema   Lab Results:  Lab Results  Component Value Date   WBC 6.6 04/08/2012   HGB 15.6 04/08/2012   HCT 43.3 04/08/2012   MCV 96.2 04/08/2012   PLT 143 04/08/2012   ANC 4.0   Medications: I have reviewed the patient's current medications.  Assessment/Plan: 1. Mild thrombocytopenia -the platelet count is now in the low normal range 2. Lumbar fusion surgery 12/24/2009. 3. Variation in red cell size and shape on the peripheral blood smear 02/18/2010. The MCV is now normal. 4. Cervical spine surgery in 2009. 5. Right knee replacement in 2006. 6. Mild chronic obstructive pulmonary disease. 7. Hypertension. 8. Hypercholesterolemia. 9. History of Mild lower extremity edema. 10. Quadriceps tendon rupture in 2007.   Disposition:  He is stable from a hematologic standpoint. He would like to continue followup in the hematology clinic. Mr. Trouten will return for an office visit and CBC in one year.  He will followup with Dr. Maple Hudson if the hoarseness persists.   Thornton Papas, MD  04/08/2012  2:19 PM

## 2012-04-13 ENCOUNTER — Ambulatory Visit (INDEPENDENT_AMBULATORY_CARE_PROVIDER_SITE_OTHER): Payer: Medicare Other | Admitting: Cardiology

## 2012-04-13 ENCOUNTER — Encounter: Payer: Self-pay | Admitting: Cardiology

## 2012-04-13 ENCOUNTER — Ambulatory Visit: Payer: Medicare Other | Admitting: Cardiology

## 2012-04-13 VITALS — BP 120/80 | HR 60 | Ht 73.0 in | Wt 219.8 lb

## 2012-04-13 DIAGNOSIS — I4949 Other premature depolarization: Secondary | ICD-10-CM

## 2012-04-13 DIAGNOSIS — E7800 Pure hypercholesterolemia, unspecified: Secondary | ICD-10-CM

## 2012-04-13 DIAGNOSIS — I4891 Unspecified atrial fibrillation: Secondary | ICD-10-CM

## 2012-04-13 DIAGNOSIS — I10 Essential (primary) hypertension: Secondary | ICD-10-CM

## 2012-04-13 DIAGNOSIS — I493 Ventricular premature depolarization: Secondary | ICD-10-CM

## 2012-04-13 MED ORDER — LOSARTAN POTASSIUM 100 MG PO TABS: 100.0000 mg | ORAL_TABLET | Freq: Every day | ORAL | Status: DC

## 2012-04-13 NOTE — Assessment & Plan Note (Signed)
Blood pressure has been satisfactory.  The patient has been having a lot of problem having to clear his throat and has had some chronic hoarseness.  This may be a side effect from his ACE inhibitor benazepril.  We will stop benazepril and switched to losartan 100 mg daily and observe whether it helps his symptoms.

## 2012-04-13 NOTE — Assessment & Plan Note (Signed)
The patient is on Lipitor 40 mg daily.  Is not having side effects.  Blood work is satisfactory.

## 2012-04-13 NOTE — Patient Instructions (Addendum)
Your physician wants you to follow-up in: 4 MONTHS WITH DR Patty Sermons You will receive a reminder letter in the mail two months in advance. If you don't receive a letter, please call our office to schedule the follow-up appointment.  STOP BENAZEPRIL  START LOSARTAN 100 MG ONCE DAILY  Your physician recommends that you return for lab work in: 4 MONTHS PRIOR TO APPOINTMENT

## 2012-04-13 NOTE — Progress Notes (Signed)
Theodore [name] Date of Birth:  [birth year] [facility] [address] [place], [place]  [address] [phone]         Fax   [phone]  History of Present Illness: This pleasant 78 year old gentleman is seen for a scheduled followup office visit. He has a past history of paroxysmal atrial fibrillation and a history of essential hypertension and dyslipidemia.  He does not have any history of ischemic heart disease.  He does have a history of asthma and is followed by Dr. Maple Hudson.  He has had a lot of orthopedic issues and problems with low back pain but that has recently been more stable.   Current Outpatient Prescriptions  Medication Sig Dispense Refill  . amLODipine (NORVASC) 10 MG tablet Take 1 tablet (10 mg total) by mouth daily.  90 tablet  3  . Ascorbic Acid (VITAMIN C PO) Take 1 tablet by mouth daily.       [name] aspirin 81 MG tablet Take 81 mg by mouth daily.        [name] atorvastatin (LIPITOR) 40 MG tablet Take 1 tablet (40 mg total) by mouth daily.  90 tablet  2  . Fluticasone-Salmeterol (ADVAIR) 250-50 MCG/DOSE AEPB Inhale 1 puff into the lungs every 12 (twelve) hours.  180 each  3  . hydrochlorothiazide (HYDRODIURIL) 25 MG tablet Take 1 tablet (25 mg total) by mouth daily.  90 tablet  3  . LYSINE PO Take 1 tablet by mouth daily.       . metoprolol tartrate (LOPRESSOR) 25 MG tablet 2 tablets in the morning and 1 in the evening  270 tablet  3  . naproxen sodium (ANAPROX) 220 MG tablet Take 440 mg by mouth daily.      [name] omeprazole (PRILOSEC) 20 MG capsule Take 20 mg by mouth daily.        [name] losartan (COZAAR) 100 MG tablet Take 1 tablet (100 mg total) by mouth daily.  90 tablet  3   No current facility-administered medications for this visit.    Allergies  Allergen Reactions  . Ace Inhibitors   . Lopid (Gemfibrozil)   . Niacin And Related   . Penicillins   . Tamiflu     Rash,itching  . Zocor (Simvastatin)     Patient Active Problem List  Diagnosis  .  HYPERLIPIDEMIA  . HYPERTENSION  . BRONCHITIS  . DYSPNEA  . Hypertension  . Hypercholesterolemia  . Peripheral edema  . Nocturia  . Right bundle branch block  . Atrial fibrillation  . Tendinitis of elbow  . Allergic-infective asthma  . GERD (gastroesophageal reflux disease)    History  Smoking status  . Never Smoker   Smokeless tobacco  . Not on file    History  Alcohol Use No    Family History  Problem Relation Age of Onset  . Heart attack Father   . Heart disease Brother   . Heart disease Brother   . COPD Brother   . COPD Father   . Asthma Sister     Review of Systems: Constitutional: no fever chills diaphoresis or fatigue or change in weight.  Head and neck: no hearing loss, no epistaxis, no photophobia or visual disturbance. Respiratory: No cough, shortness of breath or wheezing. Cardiovascular: No chest pain peripheral edema, palpitations. Gastrointestinal: No abdominal distention, no abdominal pain, no change in bowel habits hematochezia or melena. Genitourinary: No dysuria, no frequency, no urgency, no nocturia. Musculoskeletal:No arthralgias, no back pain, no gait disturbance  or myalgias. Neurological: No dizziness, no headaches, no numbness, no seizures, no syncope, no weakness, no tremors. Hematologic: No lymphadenopathy, no easy bruising. Psychiatric: No confusion, no hallucinations, no sleep disturbance.    Physical Exam: Filed Vitals:   04/13/12 1006  BP: 120/80  Pulse: 60   general appearance reveals a well-developed well-nourished gentleman in no distress.The head and neck exam reveals pupils equal and reactive.  Extraocular movements are full.  There is no scleral icterus.  The mouth and pharynx are normal.  The neck is supple.  The carotids reveal no bruits.  The jugular venous pressure is normal.  The  thyroid is not enlarged.  There is no lymphadenopathy.  The chest is clear to percussion and auscultation.  There are no rales or rhonchi.   Expansion of the chest is symmetrical.  The precordium is quiet.  The first heart sound is normal.  The second heart sound is physiologically split.  There is no murmur gallop rub or click.  There is no abnormal lift or heave.  The abdomen is soft and nontender.  The bowel sounds are normal.  The liver and spleen are not enlarged.  There are no abdominal masses.  There are no abdominal bruits.  Extremities reveal good pedal pulses.  There is no phlebitis or edema.  There is no cyanosis or clubbing.  Strength is normal and symmetrical in all extremities.  There is no lateralizing weakness.  There are no sensory deficits.  The skin is warm and dry.  There is no rash.  EKG shows normal sinus rhythm at 74 beats per minute with occasional and consecutive PVCs.  He has bifascicular block and the PR interval is normal   Assessment / Plan: Continue same medication except switch to losartan.  Continue careful diet.  Recheck in 4 months for followup office visit lipid panel hepatic function panel and basal metabolic panel.

## 2012-04-13 NOTE — Assessment & Plan Note (Addendum)
The patient has a remote history of paroxysmal atrial fibrillation but no recent episodes.  He is not on long-term anticoagulation other than for aspirin.  His rhythm was slightly irregular today but EKG showed that it was normal sinus rhythm with occasional PVCs.

## 2012-05-25 ENCOUNTER — Ambulatory Visit (INDEPENDENT_AMBULATORY_CARE_PROVIDER_SITE_OTHER): Payer: Medicare Other | Admitting: Cardiovascular Disease

## 2012-05-25 ENCOUNTER — Telehealth: Payer: Self-pay | Admitting: Cardiology

## 2012-05-25 ENCOUNTER — Encounter: Payer: Self-pay | Admitting: Cardiovascular Disease

## 2012-05-25 ENCOUNTER — Ambulatory Visit (INDEPENDENT_AMBULATORY_CARE_PROVIDER_SITE_OTHER): Payer: Medicare Other | Admitting: *Deleted

## 2012-05-25 VITALS — BP 108/80 | HR 97 | Ht 73.0 in | Wt 218.1 lb

## 2012-05-25 DIAGNOSIS — I4891 Unspecified atrial fibrillation: Secondary | ICD-10-CM

## 2012-05-25 DIAGNOSIS — I10 Essential (primary) hypertension: Secondary | ICD-10-CM

## 2012-05-25 DIAGNOSIS — I4510 Unspecified right bundle-branch block: Secondary | ICD-10-CM

## 2012-05-25 DIAGNOSIS — R0989 Other specified symptoms and signs involving the circulatory and respiratory systems: Secondary | ICD-10-CM

## 2012-05-25 DIAGNOSIS — E785 Hyperlipidemia, unspecified: Secondary | ICD-10-CM

## 2012-05-25 LAB — CBC WITH DIFFERENTIAL/PLATELET
Eosinophils Relative: 0.3 % (ref 0.0–5.0)
HCT: 46.6 % (ref 39.0–52.0)
Hemoglobin: 16 g/dL (ref 13.0–17.0)
Lymphs Abs: 3.5 10*3/uL (ref 0.7–4.0)
Monocytes Relative: 8.3 % (ref 3.0–12.0)
Neutro Abs: 5.5 10*3/uL (ref 1.4–7.7)
Platelets: 176 10*3/uL (ref 150.0–400.0)
WBC: 9.9 10*3/uL (ref 4.5–10.5)

## 2012-05-25 LAB — BASIC METABOLIC PANEL
Chloride: 98 mEq/L (ref 96–112)
GFR: 57.6 mL/min — ABNORMAL LOW (ref >60.00)
Potassium: 3.4 mEq/L — ABNORMAL LOW (ref 3.5–5.1)

## 2012-05-25 LAB — TSH: TSH: 1.75 u[IU]/mL (ref 0.35–5.50)

## 2012-05-25 MED ORDER — DABIGATRAN ETEXILATE MESYLATE 150 MG PO CAPS: 150.0000 mg | ORAL_CAPSULE | Freq: Two times a day (BID) | ORAL | Status: DC

## 2012-05-25 MED ORDER — DILTIAZEM HCL ER COATED BEADS 180 MG PO CP24: 180.0000 mg | ORAL_CAPSULE | Freq: Every day | ORAL | Status: DC

## 2012-05-25 NOTE — Telephone Encounter (Signed)
Patient thinks he went back into Afib Thursday night. Will have him come in at 1:30 today for an EKG.

## 2012-05-25 NOTE — Assessment & Plan Note (Signed)
Represents today with recurrent atrial fibrillation. He has probably been in atrial fibrillation for 5 days now. He's not tolerating it very well. He has fatigue and shortness breath. He's not had any angina or syncope.  We discussed various options including 30 days of anticoagulation followed by cardioversion. We also discussed transesophageal guided cardioversion which we could do in 3 days.  We'll check labs today including a TSH, CBC, and basic metabolic profile.  We'll start him on Pradaxa 150 mg twice a day. He was on this medication previously and tolerated it very well.  We'll discontinue his Norvasc and start him on diltiazem 180 mg slow release a day. He'll continue the current dose of his metoprolol. May need to titrate her metoprolol based on his heart rate response.  He will call his insurance regarding the Price of  the cost of transesophageal guided cardioversion versus standard elective cardioversion in one month.

## 2012-05-25 NOTE — Progress Notes (Signed)
Patient came into office today for an EKG. Patient felt he had gone back in Afib Thursday night sometime. EKG was done and reviewed by Dr Elease Hashimoto since  Dr. Patty Sermons out of the office. Dr Elease Hashimoto did see patient for an office visit

## 2012-05-25 NOTE — Patient Instructions (Addendum)
Options:  Check with insurance  1. 4 weeks of Pradaxa 150 mg twice a day for 1 month  followed by elective cardioversion 2. 5 doses of Pradaxa followed by transesophageal echo which will then be followed by cardioversion if the echocardiogram does not show any thrombus.  Your physician has recommended you make the following change in your medication:   STOP NAPROXEN WHILE ON PRADAXA STOP NORVASC START PRADAXA 150 MG TWICE A DAY (TWELVE HOURS APART) START DILTIAZEM CD 180 MG ONCE A DAY  Your physician recommends that you schedule a follow-up appointment in: 3-4 WEEK WITH DR. Patty Sermons

## 2012-05-25 NOTE — Telephone Encounter (Signed)
New Problem:    Patient called in wanting to speak with you about a recurring issue that he has been discussing with Dr. Patty Sermons.  Please call back.

## 2012-05-25 NOTE — Progress Notes (Signed)
Theodore [name] Date of Birth:  Oct 09, 1934 [facility] [address] [place], [place]  [address] [phone]         Fax   [phone]  Problem list: 1. Paroxysmal atrial fibrillation 2. Hypertension 3. Dyslipidemia  History of Present Illness: This pleasant 78 year old gentleman is seen for a scheduled followup office visit. He has a past history of paroxysmal atrial fibrillation and a history of essential hypertension and dyslipidemia.  He does not have any history of ischemic heart disease.  He does have a history of asthma and is followed by Dr. Maple Hudson.  He has had a lot of orthopedic issues and problems with low back pain but that has recently been more stable.  May 25, 2012:  Theodore [name] presents today for work in visit for atrial fib.  4-5 days ago, he played golf and had to walk quite a bit.  The following night, he noticed that his HR was rapid and irregular. He felt fatigued. He continued to have fatigue and is very short of breath.  No CP, no syncope.    He has been on Pradaxa in the past - converted spontaneously.  He has not had any recent labs.    He is "nervous" and shakey.  He has lost a few pounds.  He has not had any diarrhea.    Current Outpatient Prescriptions  Medication Sig Dispense Refill  . amLODipine (NORVASC) 10 MG tablet Take 1 tablet (10 mg total) by mouth daily.  90 tablet  3  . Ascorbic Acid (VITAMIN C PO) Take 1 tablet by mouth daily.       [name] aspirin 81 MG tablet Take 81 mg by mouth daily.        [name] atorvastatin (LIPITOR) 40 MG tablet Take 1 tablet (40 mg total) by mouth daily.  90 tablet  2  . Fluticasone-Salmeterol (ADVAIR) 250-50 MCG/DOSE AEPB Inhale 1 puff into the lungs every 12 (twelve) hours.  180 each  3  . hydrochlorothiazide (HYDRODIURIL) 25 MG tablet Take 1 tablet (25 mg total) by mouth daily.  90 tablet  3  . losartan (COZAAR) 100 MG tablet Take 1 tablet (100 mg total) by mouth daily.  90 tablet  3  . LYSINE PO Take 1  tablet by mouth daily.       . metoprolol tartrate (LOPRESSOR) 25 MG tablet 2 tablets in the morning and 1 in the evening  270 tablet  3  . naproxen sodium (ANAPROX) 220 MG tablet Take 440 mg by mouth daily.      [name] omeprazole (PRILOSEC) 20 MG capsule Take 20 mg by mouth daily.         No current facility-administered medications for this visit.    Allergies  Allergen Reactions  . Ace Inhibitors   . Lopid (Gemfibrozil)   . Niacin And Related   . Penicillins   . Tamiflu     Rash,itching  . Zocor (Simvastatin)     Patient Active Problem List  Diagnosis  . HYPERLIPIDEMIA  . HYPERTENSION  . BRONCHITIS  . DYSPNEA  . Hypertension  . Hypercholesterolemia  . Peripheral edema  . Nocturia  . Right bundle branch block  . Atrial fibrillation  . Tendinitis of elbow  . Allergic-infective asthma  . GERD (gastroesophageal reflux disease)    History  Smoking status  . Never Smoker   Smokeless tobacco  . Not on file    History  Alcohol Use No    Family History  Problem Relation Age of Onset  . Heart attack Father   . Heart disease Brother   . Heart disease Brother   . COPD Brother   . COPD Father   . Asthma Sister     Review of Systems: Constitutional: no fever chills diaphoresis or fatigue or change in weight.  Head and neck: no hearing loss, no epistaxis, no photophobia or visual disturbance. Respiratory: No cough, shortness of breath or wheezing. Cardiovascular: No chest pain peripheral edema, palpitations. Gastrointestinal: No abdominal distention, no abdominal pain, no change in bowel habits hematochezia or melena. Genitourinary: No dysuria, no frequency, no urgency, no nocturia. Musculoskeletal:No arthralgias, no back pain, no gait disturbance or myalgias. Neurological: No dizziness, no headaches, no numbness, no seizures, no syncope, no weakness, no tremors. Hematologic: No lymphadenopathy, no easy bruising. Psychiatric: No confusion, no hallucinations, no sleep  disturbance.    Physical Exam: Filed Vitals:   05/25/12 1347  BP: 108/80  Pulse: 97   HEENT: normal Lungs:  clear Cor: irreg. Irreg.  Abd: + BS NT Ext: trace leg edema Neuro: normal     EKG: 05/25/2012: Atrial fibrillation at 97 beats a minute. His right branch block with left axis deviation.

## 2012-05-31 ENCOUNTER — Encounter: Payer: Self-pay | Admitting: Cardiology

## 2012-05-31 ENCOUNTER — Encounter: Payer: Self-pay | Admitting: *Deleted

## 2012-05-31 ENCOUNTER — Ambulatory Visit (INDEPENDENT_AMBULATORY_CARE_PROVIDER_SITE_OTHER): Payer: Medicare Other | Admitting: Cardiology

## 2012-05-31 VITALS — BP 128/74 | HR 66 | Ht 73.0 in | Wt 218.8 lb

## 2012-05-31 DIAGNOSIS — R0602 Shortness of breath: Secondary | ICD-10-CM

## 2012-05-31 DIAGNOSIS — I480 Paroxysmal atrial fibrillation: Secondary | ICD-10-CM

## 2012-05-31 DIAGNOSIS — I4891 Unspecified atrial fibrillation: Secondary | ICD-10-CM

## 2012-05-31 DIAGNOSIS — I119 Hypertensive heart disease without heart failure: Secondary | ICD-10-CM

## 2012-05-31 DIAGNOSIS — I4510 Unspecified right bundle-branch block: Secondary | ICD-10-CM

## 2012-05-31 NOTE — Assessment & Plan Note (Signed)
EKG today shows right bundle branch block which is old.  He is not having any Stokes-Adams attacks.

## 2012-05-31 NOTE — Progress Notes (Signed)
Theodore [name] Date of Birth:  24-Aug-1934 [facility] [address] [place], [place]  [address] [phone]         Fax   [phone]  History of Present Illness: This pleasant 78 year old gentleman is seen for a scheduled followup office visit. He has a past history of paroxysmal atrial fibrillation and a history of essential hypertension and dyslipidemia. He does not have any history of ischemic heart disease. He does have a history of asthma and is followed by Dr. Maple Hudson. He has had a lot of orthopedic issues and problems with low back pain but that has recently been more stable.  He was seen as a work in on April 10 because of recurrent atrial fibrillation with rapid ventricular response.  He was started on Pradaxa and also diltiazem was added to his regimen.  He has felt clinically improved and in fact he felt that he had probably gone back into normal sinus rhythm this past weekend.  However EKG today shows that he is still in atrial fibrillation although the ventricular responses more regular and slow at 66 per minute.   Current Outpatient Prescriptions  Medication Sig Dispense Refill  . Ascorbic Acid (VITAMIN C PO) Take 1 tablet by mouth daily.       [name] aspirin 81 MG tablet Take 81 mg by mouth daily.        [name] atorvastatin (LIPITOR) 40 MG tablet Take 1 tablet (40 mg total) by mouth daily.  90 tablet  2  . dabigatran (PRADAXA) 150 MG CAPS Take 1 capsule (150 mg total) by mouth every 12 (twelve) hours.  60 capsule  5  . diltiazem (CARDIZEM CD) 180 MG 24 hr capsule Take 1 capsule (180 mg total) by mouth daily.  30 capsule  5  . Fluticasone-Salmeterol (ADVAIR) 250-50 MCG/DOSE AEPB Inhale 1 puff into the lungs every 12 (twelve) hours.  180 each  3  . hydrochlorothiazide (HYDRODIURIL) 25 MG tablet Take 1 tablet (25 mg total) by mouth daily.  90 tablet  3  . losartan (COZAAR) 100 MG tablet Take 1 tablet (100 mg total) by mouth daily.  90 tablet  3  . LYSINE PO Take 1  tablet by mouth daily.       . metoprolol tartrate (LOPRESSOR) 25 MG tablet 2 tablets in the morning and 1 in the evening  270 tablet  3  . omeprazole (PRILOSEC) 20 MG capsule Take 20 mg by mouth daily.         No current facility-administered medications for this visit.    Allergies  Allergen Reactions  . Ace Inhibitors   . Lopid (Gemfibrozil)   . Niacin And Related   . Penicillins   . Tamiflu     Rash,itching  . Zocor (Simvastatin)     Patient Active Problem List  Diagnosis  . HYPERLIPIDEMIA  . HYPERTENSION  . BRONCHITIS  . DYSPNEA  . Hypertension  . Hypercholesterolemia  . Peripheral edema  . Nocturia  . Right bundle branch block  . Atrial fibrillation  . Tendinitis of elbow  . Allergic-infective asthma  . GERD (gastroesophageal reflux disease)    History  Smoking status  . Never Smoker   Smokeless tobacco  . Not on file    History  Alcohol Use No    Family History  Problem Relation Age of Onset  . Heart attack Father   . Heart disease Brother   . Heart disease Brother   . COPD Brother   .  COPD Father   . Asthma Sister     Review of Systems: Constitutional: no fever chills diaphoresis or fatigue or change in weight.  Head and neck: no hearing loss, no epistaxis, no photophobia or visual disturbance. Respiratory: No cough, shortness of breath or wheezing. Cardiovascular: No chest pain peripheral edema, palpitations. Gastrointestinal: No abdominal distention, no abdominal pain, no change in bowel habits hematochezia or melena. Genitourinary: No dysuria, no frequency, no urgency, no nocturia. Musculoskeletal:No arthralgias, no back pain, no gait disturbance or myalgias. Neurological: No dizziness, no headaches, no numbness, no seizures, no syncope, no weakness, no tremors. Hematologic: No lymphadenopathy, no easy bruising. Psychiatric: No confusion, no hallucinations, no sleep disturbance.    Physical Exam: Filed Vitals:   05/31/12 1457  BP:  128/74  Pulse: 66   the general appearance reveals a well-developed well-nourished gentleman in no distress.The head and neck exam reveals pupils equal and reactive.  Extraocular movements are full.  There is no scleral icterus.  The mouth and pharynx are normal.  The neck is supple.  The carotids reveal no bruits.  The jugular venous pressure is normal.  The  thyroid is not enlarged.  There is no lymphadenopathy.    There are mild expiratory wheezes at the bases..  Expansion of the chest is symmetrical.  The precordium is quiet.  The pulse is slightly irregular in atrial fibrillation The first heart sound is normal.  The second heart sound is physiologically split.  There is no murmur gallop rub or click.  There is no abnormal lift or heave.  The abdomen is soft and nontender.  The bowel sounds are normal.  The liver and spleen are not enlarged.  There are no abdominal masses.  There are no abdominal bruits.  Extremities reveal good pedal pulses.  There is no phlebitis or edema.  There is no cyanosis or clubbing.  Strength is normal and symmetrical in all extremities.  There is no lateralizing weakness.  There are no sensory deficits.  The skin is warm and dry.  There is no rash.     Assessment / Plan: Continue same medication.  He is planning to go on a golf trip with some friends from 06/09/12 until 06/13/12.  This will be okay for him to participate in but he will be careful to avoid dehydration if the weather turns hot. If after a month he is still in atrial fibrillation we will consider elective cardioversion.

## 2012-05-31 NOTE — Assessment & Plan Note (Signed)
Blood pressure was remaining stable on current therapy.  No dizziness or syncope. 

## 2012-05-31 NOTE — Assessment & Plan Note (Signed)
Patient has occasional episodes of dyspnea which responded to his inhalers as at home

## 2012-05-31 NOTE — Assessment & Plan Note (Signed)
Presently the patient is rate controlled in atrial fibrillation and on Pradaxa for prevention of systemic emboli.  We will continue current medication and have him return as already scheduled on May 9 and at that point repeat his electrocardiogram and make a decision about possible scheduling an elective cardioversion.

## 2012-05-31 NOTE — Patient Instructions (Addendum)
Your physician recommends that you continue on your current medications as directed. Please refer to the Current Medication list given to you today.  KEEP YOU MAY APPOINTMENT

## 2012-06-01 ENCOUNTER — Ambulatory Visit: Payer: Medicare Other | Admitting: Cardiology

## 2012-06-11 IMAGING — CR DG CHEST 2V
2 series · 2 of 2 positions shown · non-contrast
Comparison: 07/26/2009

CLINICAL DATA: Atrial fibrillation.

CHEST - 2 VIEW

[view not recorded (1 of 2)]
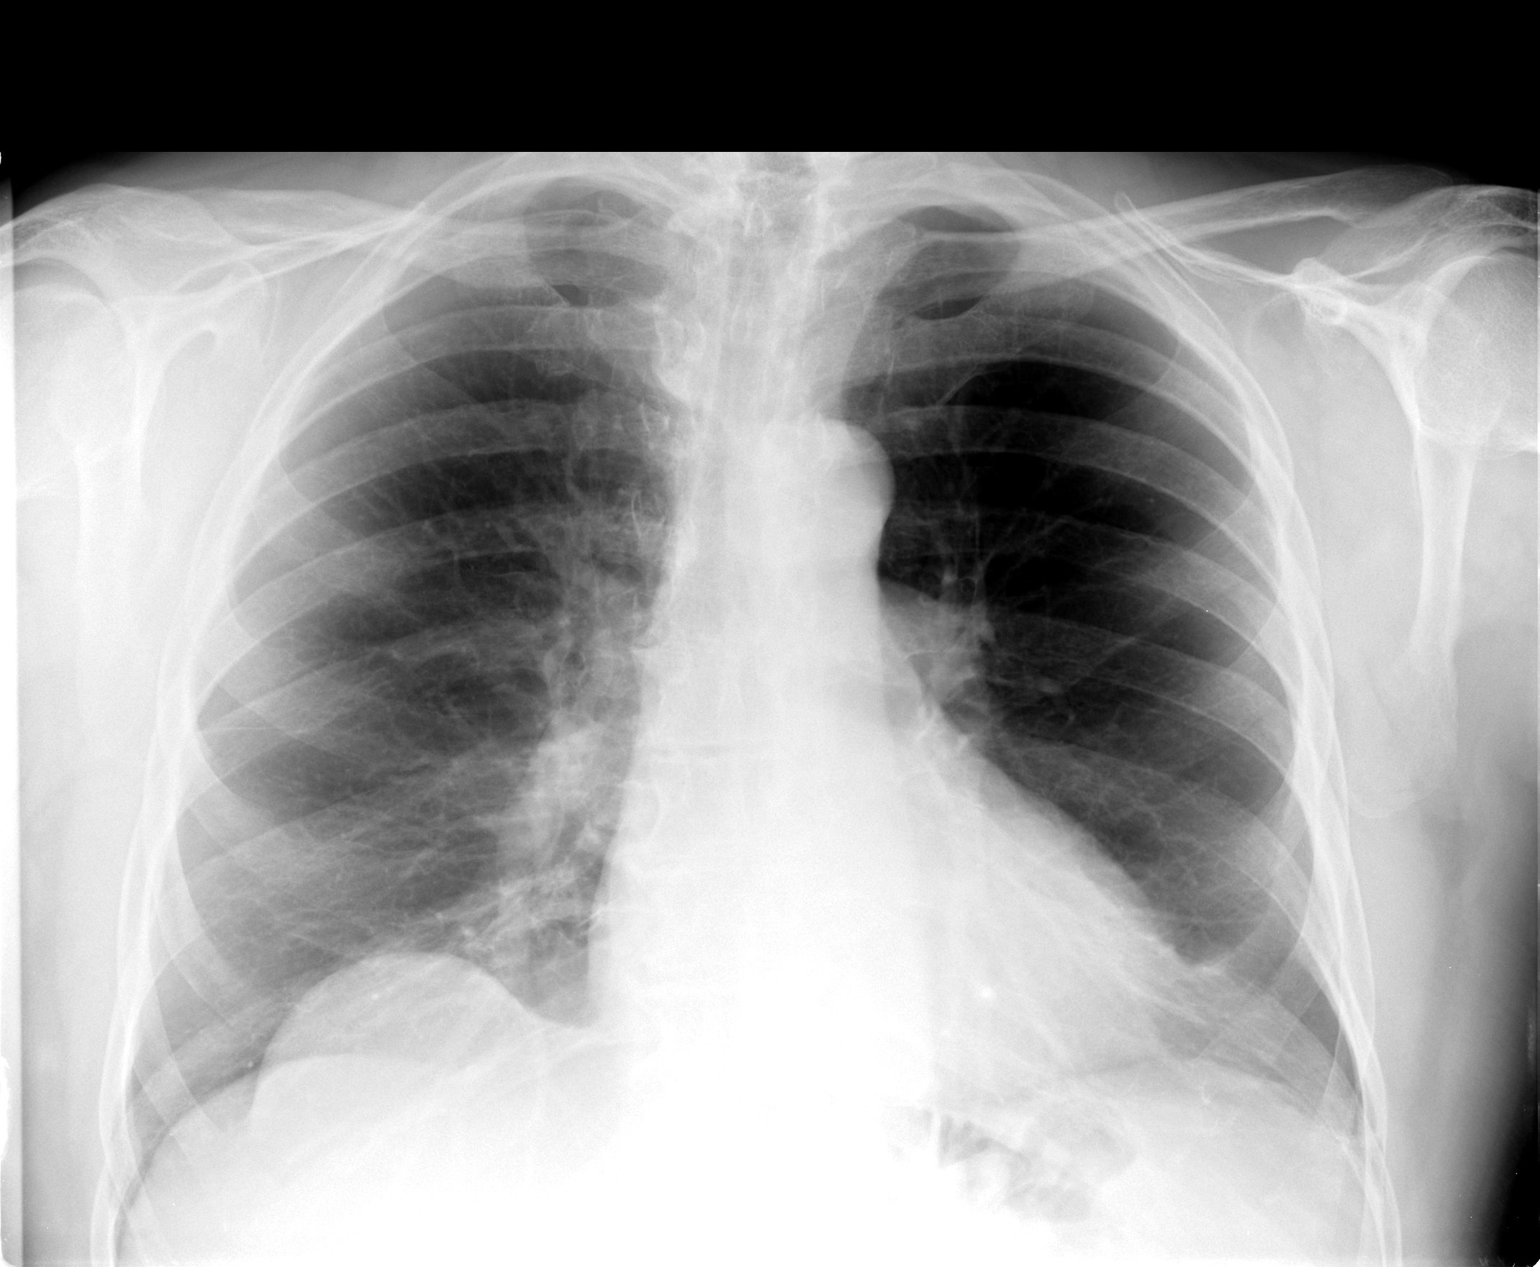

[view not recorded (2 of 2)]
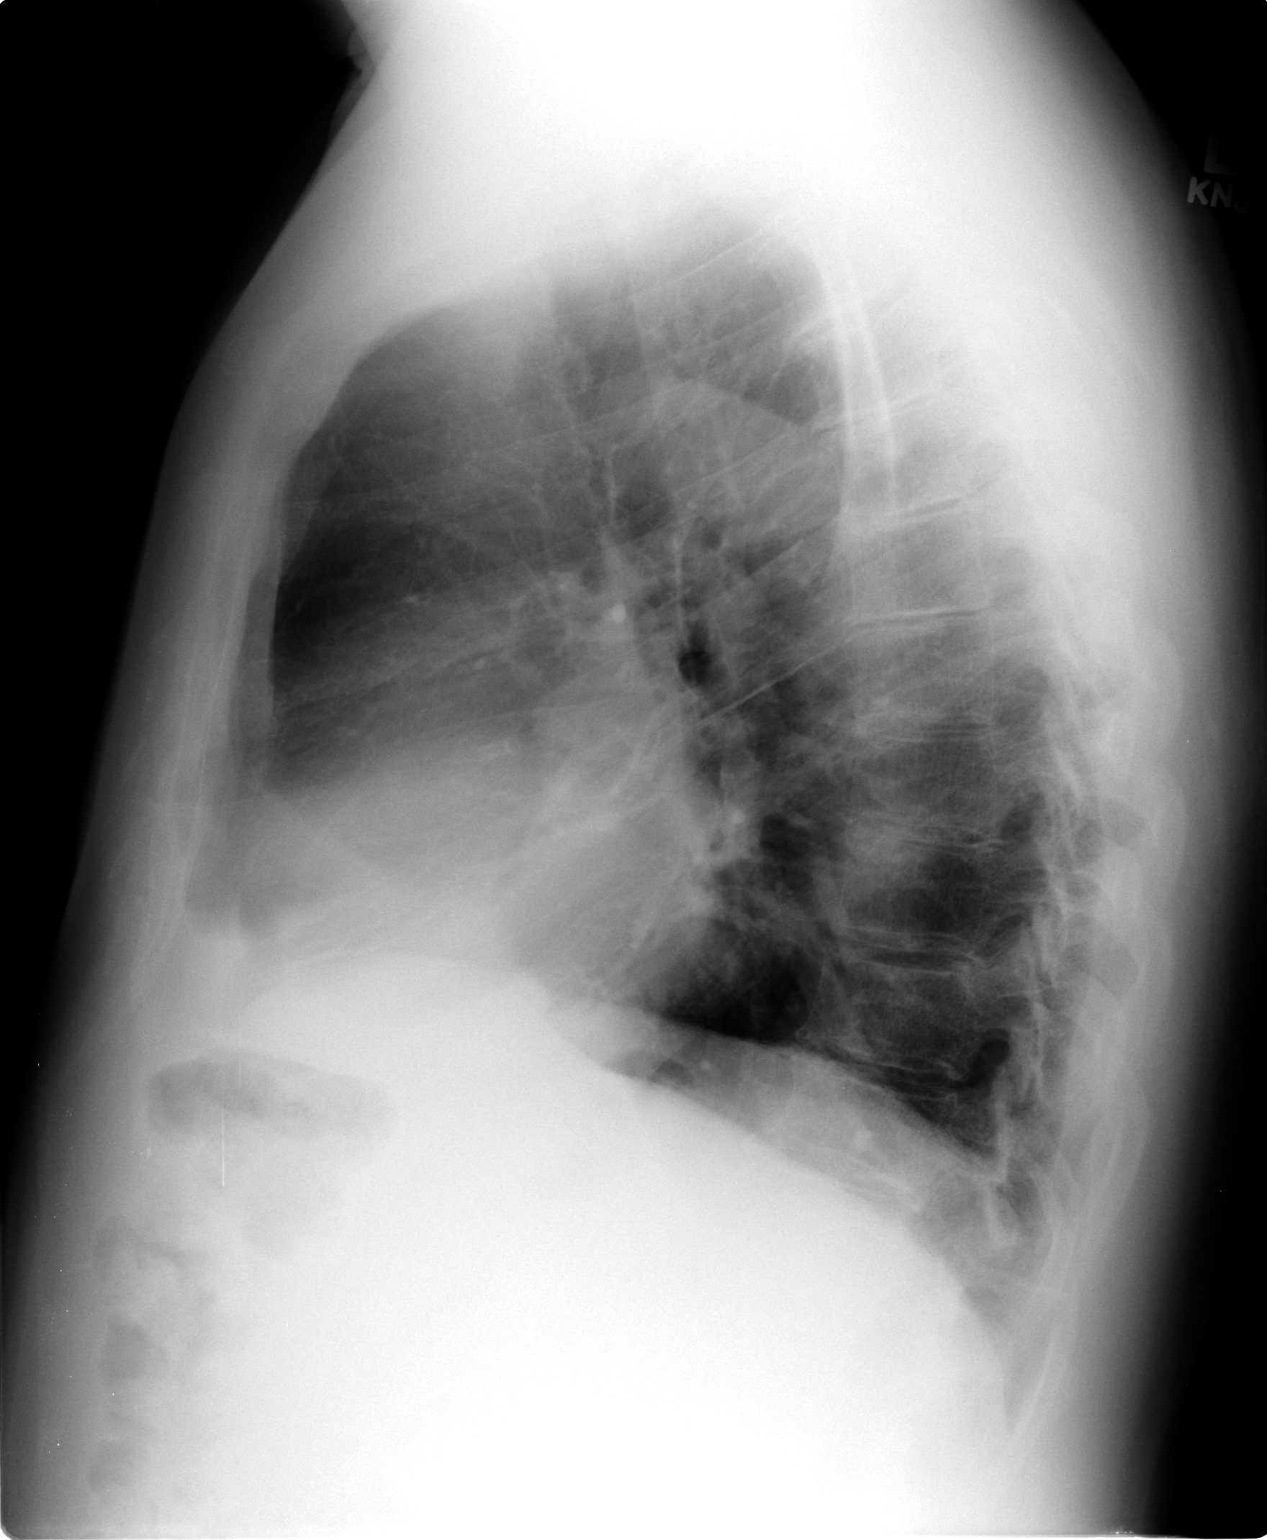

[2 of 2 positions shown; findings below may reference images not displayed]

FINDINGS: Lower cervical spine fixation. Midline trachea.
Borderline cardiomegaly.     Mediastinal contours otherwise within
normal limits.  No pleural effusion or pneumothorax.  Mild
bibasilar scarring / volume loss. No congestive failure.

Eventration of the right hemidiaphragm medially.
IMPRESSION: No acute cardiopulmonary disease.

## 2012-06-18 ENCOUNTER — Ambulatory Visit (INDEPENDENT_AMBULATORY_CARE_PROVIDER_SITE_OTHER): Payer: Medicare Other | Admitting: Cardiology

## 2012-06-18 ENCOUNTER — Encounter: Payer: Self-pay | Admitting: Cardiology

## 2012-06-18 VITALS — BP 136/79 | HR 62 | Ht 73.0 in | Wt 217.0 lb

## 2012-06-18 DIAGNOSIS — I4510 Unspecified right bundle-branch block: Secondary | ICD-10-CM

## 2012-06-18 DIAGNOSIS — E785 Hyperlipidemia, unspecified: Secondary | ICD-10-CM

## 2012-06-18 DIAGNOSIS — I4891 Unspecified atrial fibrillation: Secondary | ICD-10-CM

## 2012-06-18 DIAGNOSIS — R0602 Shortness of breath: Secondary | ICD-10-CM

## 2012-06-18 DIAGNOSIS — M1990 Unspecified osteoarthritis, unspecified site: Secondary | ICD-10-CM

## 2012-06-18 DIAGNOSIS — I10 Essential (primary) hypertension: Secondary | ICD-10-CM

## 2012-06-18 MED ORDER — DILTIAZEM HCL ER COATED BEADS 180 MG PO CP24: 180.0000 mg | ORAL_CAPSULE | Freq: Every day | ORAL | Status: DC

## 2012-06-18 MED ORDER — DABIGATRAN ETEXILATE MESYLATE 150 MG PO CAPS: 150.0000 mg | ORAL_CAPSULE | Freq: Two times a day (BID) | ORAL | Status: DC

## 2012-06-18 NOTE — Assessment & Plan Note (Signed)
Since yesterday the patient has felt better.  His peripheral edema has cleared since yesterday and he feels that his heart is back in normal rhythm.  EKG confirms that he is indeed in normal sinus rhythm.  Because he goes in and out of atrial fibrillation he will need to stay on long-term anticoagulation and we are refilling his Pradaxa.

## 2012-06-18 NOTE — Assessment & Plan Note (Signed)
Patient still has some mild exertional dyspnea but his dyspnea has improved since restoration of normal sinus rhythm spontaneously yesterday.  The patient did go on a golf outing the previous week but did poorly because he was experiencing a lot of dyspnea.

## 2012-06-18 NOTE — Assessment & Plan Note (Signed)
Patient has a past history of hypercholesterolemia.  We will plan to recheck fasting labs at his next visit in 3 months.

## 2012-06-18 NOTE — Progress Notes (Signed)
Theodore [name] Date of Birth:  1934-06-22 [facility] [address] [place], [place]  [address] [phone]         Fax   [phone]  History of Present Illness:  This pleasant 78 year old gentleman is seen for a scheduled followup office visit. He has a past history of paroxysmal atrial fibrillation and a history of essential hypertension and dyslipidemia. He does not have any history of ischemic heart disease. He does have a history of asthma and is followed by Dr. Maple Hudson. He has had a lot of orthopedic issues and problems with low back pain but that has recently been more stable. He was seen as a work in on April 10 because of recurrent atrial fibrillation with rapid ventricular response. He was started on Pradaxa and also diltiazem was added to his regimen.  Patient was seen on 05/31/12 and at that time he was still in atrial fibrillation.  Yesterday he felt as if his heart had gone back into normal rhythm.  Current Outpatient Prescriptions  Medication Sig Dispense Refill  . Ascorbic Acid (VITAMIN C PO) Take 1 tablet by mouth daily.       [name] aspirin 81 MG tablet Take 81 mg by mouth daily.        [name] atorvastatin (LIPITOR) 40 MG tablet Take 1 tablet (40 mg total) by mouth daily.  90 tablet  2  . dabigatran (PRADAXA) 150 MG CAPS Take 1 capsule (150 mg total) by mouth every 12 (twelve) hours.  180 capsule  3  . diltiazem (CARDIZEM CD) 180 MG 24 hr capsule Take 1 capsule (180 mg total) by mouth daily.  90 capsule  3  . Fluticasone-Salmeterol (ADVAIR) 250-50 MCG/DOSE AEPB Inhale 1 puff into the lungs every 12 (twelve) hours.  180 each  3  . hydrochlorothiazide (HYDRODIURIL) 25 MG tablet Take 1 tablet (25 mg total) by mouth daily.  90 tablet  3  . losartan (COZAAR) 100 MG tablet Take 1 tablet (100 mg total) by mouth daily.  90 tablet  3  . LYSINE PO Take 1 tablet by mouth daily.       . metoprolol tartrate (LOPRESSOR) 25 MG tablet 2 tablets in the morning and 1 in the evening   270 tablet  3  . Naproxen Sodium (ALEVE PO) Take by mouth 2 (two) times daily.      [name] omeprazole (PRILOSEC) 20 MG capsule Take 20 mg by mouth daily.         No current facility-administered medications for this visit.    Allergies  Allergen Reactions  . Ace Inhibitors   . Lopid (Gemfibrozil)   . Niacin And Related   . Penicillins   . Tamiflu     Rash,itching  . Zocor (Simvastatin)     Patient Active Problem List   Diagnosis Date Noted  . HYPERLIPIDEMIA 11/27/2007    Priority: Medium  . HYPERTENSION 11/27/2007    Priority: Medium  . DYSPNEA 11/18/2007    Priority: Medium  . Allergic-infective asthma 08/26/2011  . GERD (gastroesophageal reflux disease) 08/26/2011  . Tendinitis of elbow 02/12/2011  . Atrial fibrillation 01/21/2011  . Benign hypertensive heart disease without heart failure   . Hypercholesterolemia   . Peripheral edema   . Nocturia   . Right bundle branch block   . BRONCHITIS 12/28/2007    History  Smoking status  . Never Smoker   Smokeless tobacco  . Not on file    History  Alcohol Use No  Family History  Problem Relation Age of Onset  . Heart attack Father   . Heart disease Brother   . Heart disease Brother   . COPD Brother   . COPD Father   . Asthma Sister     Review of Systems: Constitutional: no fever chills diaphoresis or fatigue or change in weight.  Head and neck: no hearing loss, no epistaxis, no photophobia or visual disturbance. Respiratory: No cough, shortness of breath or wheezing. Cardiovascular: No chest pain peripheral edema, palpitations. Gastrointestinal: No abdominal distention, no abdominal pain, no change in bowel habits hematochezia or melena. Genitourinary: No dysuria, no frequency, no urgency, no nocturia. Musculoskeletal:No arthralgias, no back pain, no gait disturbance or myalgias. Neurological: No dizziness, no headaches, no numbness, no seizures, no syncope, no weakness, no tremors. Hematologic: No  lymphadenopathy, no easy bruising. Psychiatric: No confusion, no hallucinations, no sleep disturbance.    Physical Exam: Filed Vitals:   06/18/12 1337  BP: 136/79  Pulse: 62   general appearance reveals a well-developed well-nourished gentleman in no distress.The head and neck exam reveals pupils equal and reactive.  Extraocular movements are full.  There is no scleral icterus.  The mouth and pharynx are normal.  The neck is supple.  The carotids reveal no bruits.  The jugular venous pressure is normal.  The  thyroid is not enlarged.  There is no lymphadenopathy.  The chest is clear to percussion and auscultation.  There are no rales or rhonchi.  Expansion of the chest is symmetrical.  The precordium is quiet.  The first heart sound is normal.  The second heart sound is physiologically split.  There is no murmur gallop rub or click.  There is no abnormal lift or heave.  The abdomen is soft and nontender.  The bowel sounds are normal.  The liver and spleen are not enlarged.  There are no abdominal masses.  There are no abdominal bruits.  Extremities reveal good pedal pulses.  There is no phlebitis or edema.  There is no cyanosis or clubbing.  Strength is normal and symmetrical in all extremities.  There is no lateralizing weakness.  There are no sensory deficits.  The skin is warm and dry.  There is no rash.  EKG shows sinus rhythm with first degree AV block.  He has old right bundle branch block unchanged  Assessment / Plan: Continue on same medication.  Recheck in 3 months for office visit EKG lipid panel hepatic function panel and basal metabolic panel

## 2012-06-18 NOTE — Assessment & Plan Note (Signed)
The patient has severe arthritis interfering with his ability to grasp the golf club.  We will allow him to go back on his Aleve one twice a day when necessary.  He has not been experiencing any GI complaints or dyspepsia and is on long-term omeprazole.

## 2012-06-18 NOTE — Patient Instructions (Addendum)
Ok to resume Aleve twice a day  Your physician recommends that you schedule a follow-up appointment in: 3 months with fasting labs (LP/BMET/HFP) and ekg

## 2012-07-27 ENCOUNTER — Other Ambulatory Visit: Payer: Self-pay | Admitting: Cardiology

## 2012-08-03 ENCOUNTER — Ambulatory Visit (INDEPENDENT_AMBULATORY_CARE_PROVIDER_SITE_OTHER): Payer: Medicare Other | Admitting: Internal Medicine

## 2012-08-03 ENCOUNTER — Encounter: Payer: Self-pay | Admitting: Internal Medicine

## 2012-08-03 ENCOUNTER — Ambulatory Visit (INDEPENDENT_AMBULATORY_CARE_PROVIDER_SITE_OTHER)
Admission: RE | Admit: 2012-08-03 | Discharge: 2012-08-03 | Disposition: A | Discharge status: Home / Self Care | Payer: Medicare Other | Source: Ambulatory Visit | Attending: Internal Medicine | Admitting: Internal Medicine

## 2012-08-03 VITALS — BP 140/80 | HR 55 | Ht 73.0 in | Wt 222.0 lb

## 2012-08-03 DIAGNOSIS — J45998 Other asthma: Secondary | ICD-10-CM

## 2012-08-03 DIAGNOSIS — R0600 Dyspnea, unspecified: Secondary | ICD-10-CM

## 2012-08-03 DIAGNOSIS — K219 Gastro-esophageal reflux disease without esophagitis: Secondary | ICD-10-CM

## 2012-08-03 DIAGNOSIS — R0609 Other forms of dyspnea: Secondary | ICD-10-CM

## 2012-08-03 DIAGNOSIS — R0989 Other specified symptoms and signs involving the circulatory and respiratory systems: Secondary | ICD-10-CM

## 2012-08-03 DIAGNOSIS — J42 Unspecified chronic bronchitis: Secondary | ICD-10-CM

## 2012-08-03 DIAGNOSIS — J45909 Unspecified asthma, uncomplicated: Secondary | ICD-10-CM

## 2012-08-03 DIAGNOSIS — R0602 Shortness of breath: Secondary | ICD-10-CM

## 2012-08-03 MED ORDER — ALBUTEROL SULFATE HFA 108 (90 BASE) MCG/ACT IN AERS: 2.0000 | INHALATION_SPRAY | Freq: Four times a day (QID) | RESPIRATORY_TRACT | Status: DC | PRN

## 2012-08-03 NOTE — Progress Notes (Signed)
Subjective:    Patient ID: Theodore [name], male    DOB: Sep 21, 1934, 78 y.o.   MRN: [id number]  HPI 07/25/10- 75 yoM followed for bronchitis and shortness of breath, complicated by HBP, peripheral edema. Last here July 26, 2009- Reviewed PFT from that visit- FEV1/FVC 0.65. Had back surgery in Mid-November without respiratory problems. He is walking regularly. Advair really helps, used once daily. Denies acute change, cough or wheeze.  Still follows with Dr Patty Sermons.   08/19/11- 75 yoM never smoker followed for bronchitis and shortness of breath, complicated by HBP, peripheral edema. Slight SOB with weather as humid as it is; denies any cough, congestion, or wheezing. He had no problem with anesthesia for a triceps repair. Advair doing well. Denies need for rescue inhaler. Cannot skip omeprazole for control of GERD CXR 02/23/11- IMPRESSION:  No acute cardiopulmonary disease.  Original Report Authenticated By: Consuello Bossier, M.D.   6//24/14-77 yoF followed for bronchitis and shortness of breath, complicated by HBP, peripheral edema FOLLOWS FOR: still has hoarseness and short winded at times. Hoarseness occasionally clears.  Thinks GERD is controlled with omeprazole. Dyspnea exertion climbing stairs is unchanged. Prefers Advair over Symbicort-no wheezing.  ROS-see HPI Constitutional:   No-   weight loss, night sweats, fevers, chills, fatigue, lassitude. HEENT:   No-  headaches, difficulty swallowing, tooth/dental problems, sore throat,       No-  sneezing, itching, ear ache, nasal congestion, post nasal drip,  CV:  No-   chest pain, orthopnea, PND, swelling in lower extremities, anasarca, dizziness, palpitations Resp: + shortness of breath with exertion or at rest.              No-   productive cough,  No non-productive cough,  No- coughing up of blood.              No-   change in color of mucus.  No- wheezing.   Skin: No-   rash or lesions. GI:  No-   heartburn, indigestion, abdominal  pain, nausea, vomiting,  GU:  MS:  No-   joint pain or swelling.  Neuro-     nothing unusual Psych:  No- change in mood or affect. No depression or anxiety.  No memory loss.  OBJ- Physical Exam General- Alert, Oriented, Affect-appropriate, Distress- none acute, looks fit and trim Skin- rash-none, lesions- none, excoriation- none. Tanned. +Herpes-type cold sore on lip. Lymphadenopathy- none Head- atraumatic            Eyes- Gross vision intact, PERRLA, conjunctivae and secretions clear            Ears- Hearing, canals-normal            Nose- Clear, no-Septal dev, mucus, polyps, erosion, perforation             Throat- Mallampati II , mucosa clear , drainage- none, tonsils- atrophic, +hoarse Neck- flexible , trachea midline, no stridor , thyroid nl, carotid no bruit Chest - symmetrical excursion , unlabored           Heart/CV- RRR- probably RSR today , no murmur , no gallop  , no rub, nl s1 s2                           - JVD- none , edema- none, stasis changes- none, varices- none           Lung- clear to P&A, wheeze- none, cough- none , dullness-none, rub- none  Chest wall-  Abd-  Br/ Gen/ Rectal- Not done, not indicated Extrem- cyanosis- none, clubbing, none, atrophy- none, strength- nl Neuro- grossly intact to observation

## 2012-08-03 NOTE — Patient Instructions (Addendum)
Stop Advair for at least a couple of weeks to see if it has had anything to so with the hoarseness  Instead- Script for "rescue" albuterol inhaler    You can use this up to 4 x daily, IF NEEDED, or skip it if not needed  Watch out for mild reflux happening especially when you lie down.   Order- CXR   Dx dyspnea, chronic bronchitis

## 2012-08-18 ENCOUNTER — Telehealth: Payer: Self-pay | Admitting: Internal Medicine

## 2012-08-18 NOTE — Telephone Encounter (Signed)
Result Note    CXR stable- nothing new or active   I spoke with patient about results and he verbalized understanding and had no questions

## 2012-08-18 NOTE — Progress Notes (Signed)
Quick Note:  LMTCB ______ 

## 2012-08-19 NOTE — Assessment & Plan Note (Signed)
Dyspnea on exertion thought to represent asthma/bronchitis and his AFib

## 2012-08-19 NOTE — Assessment & Plan Note (Signed)
Well controlled at this time. Plan-stop Advair while we watch to see if it was causing hoarseness.

## 2012-08-19 NOTE — Assessment & Plan Note (Signed)
Educated again on reflux precautions and symptoms.

## 2012-09-15 ENCOUNTER — Other Ambulatory Visit (INDEPENDENT_AMBULATORY_CARE_PROVIDER_SITE_OTHER): Payer: Medicare Other

## 2012-09-15 DIAGNOSIS — E785 Hyperlipidemia, unspecified: Secondary | ICD-10-CM

## 2012-09-15 LAB — LIPID PANEL
HDL: 51.3 mg/dL (ref >39.00)
Total CHOL/HDL Ratio: 3
Triglycerides: 57 mg/dL (ref 0.0–149.0)

## 2012-09-15 LAB — HEPATIC FUNCTION PANEL
ALT: 26 U/L (ref 0–53)
Albumin: 4.3 g/dL (ref 3.5–5.2)
Alkaline Phosphatase: 56 U/L (ref 39–117)
Bilirubin, Direct: 0.2 mg/dL (ref 0.0–0.3)
Total Protein: 6.7 g/dL (ref 6.0–8.3)

## 2012-09-15 LAB — BASIC METABOLIC PANEL
CO2: 29 mEq/L (ref 19–32)
Calcium: 9.3 mg/dL (ref 8.4–10.5)
Chloride: 97 mEq/L (ref 96–112)
Creatinine, Ser: 1 mg/dL (ref 0.4–1.5)
Glucose, Bld: 85 mg/dL (ref 70–99)
Sodium: 132 mEq/L — ABNORMAL LOW (ref 135–145)

## 2012-09-15 NOTE — Progress Notes (Signed)
Quick Note:  Please make copy of labs for patient visit. ______ 

## 2012-09-22 ENCOUNTER — Ambulatory Visit (INDEPENDENT_AMBULATORY_CARE_PROVIDER_SITE_OTHER): Payer: Medicare Other | Admitting: Cardiology

## 2012-09-22 ENCOUNTER — Encounter: Payer: Self-pay | Admitting: Cardiology

## 2012-09-22 VITALS — BP 134/76 | HR 63 | Ht 73.0 in | Wt 218.4 lb

## 2012-09-22 DIAGNOSIS — G4700 Insomnia, unspecified: Secondary | ICD-10-CM

## 2012-09-22 DIAGNOSIS — I4891 Unspecified atrial fibrillation: Secondary | ICD-10-CM

## 2012-09-22 DIAGNOSIS — E785 Hyperlipidemia, unspecified: Secondary | ICD-10-CM

## 2012-09-22 DIAGNOSIS — R609 Edema, unspecified: Secondary | ICD-10-CM

## 2012-09-22 DIAGNOSIS — I4949 Other premature depolarization: Secondary | ICD-10-CM

## 2012-09-22 DIAGNOSIS — I493 Ventricular premature depolarization: Secondary | ICD-10-CM | POA: Insufficient documentation

## 2012-09-22 MED ORDER — RIVAROXABAN 20 MG PO TABS: 20.0000 mg | ORAL_TABLET | Freq: Every day | ORAL | Status: DC

## 2012-09-22 NOTE — Progress Notes (Signed)
Theodore [name] Date of Birth:  10/04/1934 [facility] [address] [place], [place]  [address] [phone]         Fax   [phone]  History of Present Illness: This pleasant 78 year old gentleman is seen for a scheduled followup office visit. He has a past history of paroxysmal atrial fibrillation and a history of essential hypertension and dyslipidemia. He does not have any history of ischemic heart disease. He does have a history of asthma and is followed by Dr. Maple Hudson. He has had a lot of orthopedic issues and problems with low back pain but that has recently been more stable. He was seen as a work in on April 10 because of recurrent atrial fibrillation with rapid ventricular response. He was started on Pradaxa and also diltiazem was added to his regimen.  When seen at his last office visit in May on 06/18/12 he was back in normal sinus rhythm.    Current Outpatient Prescriptions  Medication Sig Dispense Refill  . albuterol (PROVENTIL HFA;VENTOLIN HFA) 108 (90 BASE) MCG/ACT inhaler Inhale 2 puffs into the lungs every 6 (six) hours as needed for wheezing or shortness of breath.  1 Inhaler  prn  . Ascorbic Acid (VITAMIN C PO) Take 1 tablet by mouth daily.       [name] atorvastatin (LIPITOR) 40 MG tablet Take 1 tablet (40 mg total) by mouth daily.  90 tablet  2  . diltiazem (CARDIZEM CD) 180 MG 24 hr capsule Take 1 capsule (180 mg total) by mouth daily.  90 capsule  3  . hydrochlorothiazide (HYDRODIURIL) 25 MG tablet TAKE 1 TABLET DAILY  90 tablet  2  . losartan (COZAAR) 100 MG tablet Take 1 tablet (100 mg total) by mouth daily.  90 tablet  3  . LYSINE PO Take 1 tablet by mouth daily.       . metoprolol tartrate (LOPRESSOR) 25 MG tablet TAKE 2 TABLETS IN THE MORNING AND 1 IN THE EVENING  270 tablet  2  . Naproxen Sodium (ALEVE PO) Take 1 capsule by mouth 2 (two) times daily.       [name] omeprazole (PRILOSEC) 20 MG capsule Take 20 mg by mouth daily.        .  Fluticasone-Salmeterol (ADVAIR HFA IN) Inhale into the lungs.      . Rivaroxaban (XARELTO) 20 MG TABS tablet Take 1 tablet (20 mg total) by mouth daily with supper.  90 tablet  3   No current facility-administered medications for this visit.    Allergies  Allergen Reactions  . Ace Inhibitors   . Lopid [Gemfibrozil]   . Niacin And Related   . Penicillins   . Tamiflu     Rash,itching  . Zocor [Simvastatin]     Patient Active Problem List   Diagnosis Date Noted  . HYPERLIPIDEMIA 11/27/2007    Priority: Medium  . HYPERTENSION 11/27/2007    Priority: Medium  . DYSPNEA 11/18/2007    Priority: Medium  . PVC's (premature ventricular contractions) 09/22/2012  . Osteoarthritis 06/18/2012  . Allergic-infective asthma 08/26/2011  . GERD (gastroesophageal reflux disease) 08/26/2011  . Tendinitis of elbow 02/12/2011  . Atrial fibrillation 01/21/2011  . Benign hypertensive heart disease without heart failure   . Hypercholesterolemia   . Peripheral edema   . Nocturia   . Right bundle branch block   . BRONCHITIS 12/28/2007    History  Smoking status  . Never Smoker   Smokeless tobacco  . Not on file  History  Alcohol Use No    Family History  Problem Relation Age of Onset  . Heart attack Father   . Heart disease Brother   . Heart disease Brother   . COPD Brother   . COPD Father   . Asthma Sister     Review of Systems: Constitutional: no fever chills diaphoresis or fatigue or change in weight.  Head and neck: no hearing loss, no epistaxis, no photophobia or visual disturbance. Respiratory: No cough, shortness of breath or wheezing. Cardiovascular: No chest pain peripheral edema, palpitations. Gastrointestinal: No abdominal distention, no abdominal pain, no change in bowel habits hematochezia or melena. Genitourinary: No dysuria, no frequency, no urgency, no nocturia. Musculoskeletal:No arthralgias, no back pain, no gait disturbance or myalgias. Neurological: No  dizziness, no headaches, no numbness, no seizures, no syncope, no weakness, no tremors. Hematologic: No lymphadenopathy, no easy bruising. Psychiatric: No confusion, no hallucinations, no sleep disturbance.    Physical Exam: Filed Vitals:   09/22/12 0859  BP: 134/76  Pulse: 63   general appearance reveals a well-developed well-nourished gentleman in no distress.The head and neck exam reveals pupils equal and reactive.  Extraocular movements are full.  There is no scleral icterus.  The mouth and pharynx are normal.  The neck is supple.  The carotids reveal no bruits.  The jugular venous pressure is normal.  The  thyroid is not enlarged.  There is no lymphadenopathy.  The chest is clear to percussion and auscultation.  There are no rales or rhonchi.  Expansion of the chest is symmetrical.  The precordium is quiet.  The first heart sound is normal.  The second heart sound is physiologically split.  There is no murmur gallop rub or click.  There is no abnormal lift or heave.  The abdomen is soft and nontender.  The bowel sounds are normal.  The liver and spleen are not enlarged.  There are no abdominal masses.  There are no abdominal bruits.  Extremities reveal good pedal pulses.  There is no phlebitis or edema.  There is no cyanosis or clubbing.  Strength is normal and symmetrical in all extremities.  There is no lateralizing weakness.  There are no sensory deficits.  The skin is warm and dry.  There is no rash.  EKG shows normal sinus rhythm with first degree AV block and with frequent PVCs   Assessment / Plan: Continue same medication except switch to Xarelto 20 mg daily.  Stop baby aspirin.  Trial of melatonin for insomnia Recheck in 4 months for office visit EKG CBC lipid panel hepatic function panel and basal metabolic panel.

## 2012-09-22 NOTE — Patient Instructions (Addendum)
WE ARE CHANGING YOUR PRADAXA TO XARELTO 20 MG DAILY WITH SUPPER. KEEP TAKING THE PRADAXA UNTIL YOU RECEIVE THE XARELTO IN THE MAIL  STOP THE ASPRIN   Your physician recommends that you schedule a follow-up appointment in: 4 months with fasting labs (lp/bmet/hfp/cbc) and ekg

## 2012-09-22 NOTE — Assessment & Plan Note (Signed)
The patient is no longer having any problems with peripheral edema.

## 2012-09-22 NOTE — Assessment & Plan Note (Signed)
The patient has a history of paroxysmal atrial fibrillation.  Today he is in normal sinus rhythm with PVCs.  He needs to stay on long-term anticoagulation.  We are switching him from Pradaxa to Xarelto 20 mg daily.  The Pradaxa was causing him some stomach discomfort.  The patient does not have any history of ischemic heart disease and previous stress tests have been normal so we will stop his baby aspirin as we continue his long-term anticoagulation

## 2012-09-22 NOTE — Assessment & Plan Note (Signed)
The patient has difficulty falling back to sleep after he wakes up in the middle of the night to have to go to the bathroom.  Suggested some over-the-counter remedies initially.  He will try melatonin which his wife has suggested that he try.  If that does not work he can try Benadryl or Tylenol PM.  If over-the-counter doesn't work he will call us and we can call him in a prescription drug

## 2012-10-06 ENCOUNTER — Other Ambulatory Visit: Payer: Self-pay | Admitting: Cardiology

## 2012-12-16 ENCOUNTER — Other Ambulatory Visit: Payer: Self-pay

## 2013-01-11 ENCOUNTER — Telehealth: Payer: Self-pay | Admitting: Cardiology

## 2013-01-11 MED ORDER — ATORVASTATIN CALCIUM 40 MG PO TABS: ORAL_TABLET | ORAL | Status: DC

## 2013-01-11 NOTE — Telephone Encounter (Signed)
New message     Refill generic lipitor ----[facility]/[address]

## 2013-01-12 ENCOUNTER — Other Ambulatory Visit: Payer: Self-pay

## 2013-01-12 MED ORDER — ATORVASTATIN CALCIUM 40 MG PO TABS: ORAL_TABLET | ORAL | Status: DC

## 2013-01-21 ENCOUNTER — Other Ambulatory Visit (INDEPENDENT_AMBULATORY_CARE_PROVIDER_SITE_OTHER): Payer: Medicare Other

## 2013-01-21 DIAGNOSIS — E785 Hyperlipidemia, unspecified: Secondary | ICD-10-CM

## 2013-01-21 DIAGNOSIS — I4891 Unspecified atrial fibrillation: Secondary | ICD-10-CM

## 2013-01-21 LAB — HEPATIC FUNCTION PANEL
Albumin: 4.2 g/dL (ref 3.5–5.2)
Alkaline Phosphatase: 64 U/L (ref 39–117)
Bilirubin, Direct: 0 mg/dL (ref 0.0–0.3)
Total Bilirubin: 0.8 mg/dL (ref 0.3–1.2)

## 2013-01-21 LAB — LIPID PANEL
LDL Cholesterol: 88 mg/dL (ref 0–99)
Total CHOL/HDL Ratio: 3
VLDL: 17.6 mg/dL (ref 0.0–40.0)

## 2013-01-21 LAB — CBC WITH DIFFERENTIAL/PLATELET
Basophils Relative: 0.4 % (ref 0.0–3.0)
Eosinophils Absolute: 0.1 10*3/uL (ref 0.0–0.7)
Hemoglobin: 14.8 g/dL (ref 13.0–17.0)
MCHC: 34.2 g/dL (ref 30.0–36.0)
MCV: 99.3 fl (ref 78.0–100.0)
Monocytes Absolute: 0.6 10*3/uL (ref 0.1–1.0)
Neutrophils Relative %: 62.6 % (ref 43.0–77.0)
Platelets: 202 10*3/uL (ref 150.0–400.0)
RBC: 4.36 Mil/uL (ref 4.22–5.81)

## 2013-01-21 LAB — BASIC METABOLIC PANEL
BUN: 10 mg/dL (ref 6–23)
CO2: 28 mEq/L (ref 19–32)
Chloride: 93 mEq/L — ABNORMAL LOW (ref 96–112)
Creatinine, Ser: 1 mg/dL (ref 0.4–1.5)

## 2013-01-22 NOTE — Progress Notes (Signed)
Quick Note:  Please make copy of labs for patient visit. ______ 

## 2013-01-28 ENCOUNTER — Encounter: Payer: Self-pay | Admitting: Cardiology

## 2013-01-28 ENCOUNTER — Ambulatory Visit (INDEPENDENT_AMBULATORY_CARE_PROVIDER_SITE_OTHER): Payer: Medicare Other | Admitting: Cardiology

## 2013-01-28 VITALS — BP 151/84 | HR 59 | Ht 73.0 in | Wt 219.0 lb

## 2013-01-28 DIAGNOSIS — E7800 Pure hypercholesterolemia, unspecified: Secondary | ICD-10-CM

## 2013-01-28 DIAGNOSIS — E871 Hypo-osmolality and hyponatremia: Secondary | ICD-10-CM

## 2013-01-28 DIAGNOSIS — E785 Hyperlipidemia, unspecified: Secondary | ICD-10-CM

## 2013-01-28 DIAGNOSIS — I4891 Unspecified atrial fibrillation: Secondary | ICD-10-CM

## 2013-01-28 DIAGNOSIS — I480 Paroxysmal atrial fibrillation: Secondary | ICD-10-CM

## 2013-01-28 DIAGNOSIS — I4949 Other premature depolarization: Secondary | ICD-10-CM

## 2013-01-28 DIAGNOSIS — I493 Ventricular premature depolarization: Secondary | ICD-10-CM

## 2013-01-28 NOTE — Assessment & Plan Note (Signed)
The patient has not been aware of any recurrent atrial fibrillation.  He is not having any problems from the Xarelto.  No evidence of GI bleeding.  He has not had any TIA or stroke symptoms.

## 2013-01-28 NOTE — Progress Notes (Signed)
Theodore [name] Date of Birth:  17-Feb-1934 [address] [employer] [place], [place]  [address] [phone]         Fax   [phone]  History of Present Illness: This pleasant 78 year old gentleman is seen for a scheduled followup office visit. He has a past history of paroxysmal atrial fibrillation and a history of essential hypertension and dyslipidemia. He does not have any history of ischemic heart disease. He does have a history of asthma and is followed by Dr. Maple Hudson. He has had a lot of orthopedic issues and problems with low back pain but that has recently been more stable. He was seen as a work in on April 10 because of recurrent atrial fibrillation with rapid ventricular response. He was started on Pradaxa and also diltiazem was added to his regimen.  He was subsequently switched to Xarelto.      Current Outpatient Prescriptions  Medication Sig Dispense Refill  . albuterol (PROVENTIL HFA;VENTOLIN HFA) 108 (90 BASE) MCG/ACT inhaler Inhale 2 puffs into the lungs every 6 (six) hours as needed for wheezing or shortness of breath.  1 Inhaler  prn  . Ascorbic Acid (VITAMIN C PO) Take 1 tablet by mouth daily.       [name] atorvastatin (LIPITOR) 40 MG tablet TAKE 1 TABLET DAILY  10 tablet  0  . diltiazem (CARDIZEM CD) 180 MG 24 hr capsule Take 1 capsule (180 mg total) by mouth daily.  90 capsule  3  . Fluticasone-Salmeterol (ADVAIR HFA IN) Inhale into the lungs.      . hydrochlorothiazide (HYDRODIURIL) 25 MG tablet TAKE 1 TABLET DAILY  90 tablet  2  . losartan (COZAAR) 100 MG tablet Take 1 tablet (100 mg total) by mouth daily.  90 tablet  3  . LYSINE PO Take 1 tablet by mouth daily.       . metoprolol tartrate (LOPRESSOR) 25 MG tablet TAKE 2 TABLETS IN THE MORNING AND 1 IN THE EVENING  270 tablet  2  . Naproxen Sodium (ALEVE PO) Take 1 capsule by mouth 2 (two) times daily.       [name] omeprazole (PRILOSEC) 20 MG capsule Take 20 mg by mouth daily.        . Rivaroxaban (XARELTO) 20 MG TABS  tablet Take 1 tablet (20 mg total) by mouth daily with supper.  90 tablet  3   No current facility-administered medications for this visit.    Allergies  Allergen Reactions  . Ace Inhibitors   . Lopid [Gemfibrozil]   . Niacin And Related   . Penicillins   . Tamiflu     Rash,itching  . Zocor [Simvastatin]     Patient Active Problem List   Diagnosis Date Noted  . HYPERLIPIDEMIA 11/27/2007    Priority: Medium  . HYPERTENSION 11/27/2007    Priority: Medium  . DYSPNEA 11/18/2007    Priority: Medium  . Hyposmolality and/or hyponatremia 01/28/2013  . PVC's (premature ventricular contractions) 09/22/2012  . Insomnia 09/22/2012  . Osteoarthritis 06/18/2012  . Allergic-infective asthma 08/26/2011  . GERD (gastroesophageal reflux disease) 08/26/2011  . Tendinitis of elbow 02/12/2011  . Atrial fibrillation 01/21/2011  . Benign hypertensive heart disease without heart failure   . Hypercholesterolemia   . Peripheral edema   . Nocturia   . Right bundle branch block   . BRONCHITIS 12/28/2007    History  Smoking status  . Never Smoker   Smokeless tobacco  . Not on file    History  Alcohol  Use No    Family History  Problem Relation Age of Onset  . Heart attack Father   . Heart disease Brother   . Heart disease Brother   . COPD Brother   . COPD Father   . Asthma Sister     Review of Systems: Constitutional: no fever chills diaphoresis or fatigue or change in weight.  Head and neck: no hearing loss, no epistaxis, no photophobia or visual disturbance. Respiratory: No cough, shortness of breath or wheezing. Cardiovascular: No chest pain peripheral edema, palpitations. Gastrointestinal: No abdominal distention, no abdominal pain, no change in bowel habits hematochezia or melena. Genitourinary: No dysuria, no frequency, no urgency, no nocturia. Musculoskeletal:No arthralgias, no back pain, no gait disturbance or myalgias. Neurological: No dizziness, no headaches, no  numbness, no seizures, no syncope, no weakness, no tremors. Hematologic: No lymphadenopathy, no easy bruising. Psychiatric: No confusion, no hallucinations, no sleep disturbance.    Physical Exam: Filed Vitals:   01/28/13 0844  BP: 151/84  Pulse: 59   general appearance reveals a well-developed well-nourished gentleman in no distress.The head and neck exam reveals pupils equal and reactive.  Extraocular movements are full.  There is no scleral icterus.  The mouth and pharynx are normal.  The neck is supple.  The carotids reveal no bruits.  The jugular venous pressure is normal.  The  thyroid is not enlarged.  There is no lymphadenopathy.  The chest is clear to percussion and auscultation.  There are no rales or rhonchi.  Expansion of the chest is symmetrical.  The precordium is quiet.  The first heart sound is normal.  The second heart sound is physiologically split.  There is no murmur gallop rub or click.  There is no abnormal lift or heave.  The abdomen is soft and nontender.  The bowel sounds are normal.  The liver and spleen are not enlarged.  There are no abdominal masses.  There are no abdominal bruits.  Extremities reveal good pedal pulses.  There is no phlebitis or edema.  There is no cyanosis or clubbing.  Strength is normal and symmetrical in all extremities.  There is no lateralizing weakness.  There are no sensory deficits.  The skin is warm and dry.  There is no rash.  EKG shows normal sinus rhythm with first degree AV block and with occasional PVCs   Assessment / Plan: Any same medication except reduce HCTZ to 12.5 mg daily.  Watch diet carefully.  Try to lose weight.  Recheck in 4 months for followup office visit lipid panel hepatic function panel and basal metabolic panel.  Continue Xarelto for paroxysmal atrial fibrillation.

## 2013-01-28 NOTE — Assessment & Plan Note (Signed)
We reviewed his recent labs.  He has hyponatremia.  This may be related to his hydrochlorothiazide.  His renal function is normal.  We will reduce his HCTZ to just 12.5 mg daily.  His blood pressure at home has been well within normal range

## 2013-01-28 NOTE — Assessment & Plan Note (Signed)
Patient is on Lipitor for his hypercholesterolemia.  Is not having any myalgias.

## 2013-01-28 NOTE — Patient Instructions (Signed)
DECREASE YOUR HYDROCHLOROTHIAZIDE TO 25 MG 1/2 TABLET DAILY  Your physician wants you to follow-up in: 4 months with fasting labs (lp/bmet/hfp)  You will receive a reminder letter in the mail two months in advance. If you don't receive a letter, please call our office to schedule the follow-up appointment.

## 2013-02-09 ENCOUNTER — Telehealth: Payer: Self-pay | Admitting: Cardiology

## 2013-02-09 NOTE — Telephone Encounter (Signed)
Pt called back and advised that Dr. Patty Sermons responded to my note.  He wants to increase the HCTZ to a whole pill for a couple of days until BP returns to normal.  Understands and will continue to monitor BP.

## 2013-02-09 NOTE — Telephone Encounter (Signed)
He could increase his HCTZ to a whole tablet daily for a few days until the BP returns to normal level.

## 2013-02-09 NOTE — Telephone Encounter (Signed)
Called stating for past 3 days his systolic BP has ranged from 160/ - 170/  - 180/.  States he has a "funny feeling" in his head.  States he can't really describe it-not really a headache. Wants to know if needs medication adjustment.  States he has had some ham and maybe some extra salt over the holidays.  Advised that Dr. Patty Sermons won't be back in office till Monday afternoon.  Advised to watch salt intake and continue to monitor BP.  Advised would forward message to Dr. Patty Sermons and Juliette Alcide Pratt,LPN for his advise.  Also advised to call back on Friday if BP is still elevated. He verbalizes understanding and will monitor his BP.

## 2013-02-09 NOTE — Telephone Encounter (Signed)
New Message  Pt called states that his Systolic pressure is in the 160-170's and that this is not normal// requesting a call back to discuss a potential medication change.

## 2013-03-25 ENCOUNTER — Other Ambulatory Visit: Payer: Self-pay | Admitting: Cardiology

## 2013-04-01 ENCOUNTER — Other Ambulatory Visit: Payer: Self-pay | Admitting: Cardiology

## 2013-04-08 ENCOUNTER — Ambulatory Visit (HOSPITAL_BASED_OUTPATIENT_CLINIC_OR_DEPARTMENT_OTHER): Payer: Medicare Other | Admitting: Oncology

## 2013-04-08 ENCOUNTER — Telehealth: Payer: Self-pay | Admitting: Oncology

## 2013-04-08 ENCOUNTER — Other Ambulatory Visit: Payer: Medicare Other

## 2013-04-08 VITALS — BP 125/69 | HR 58 | Temp 98.0°F | Resp 19 | Ht 73.0 in | Wt 220.1 lb

## 2013-04-08 DIAGNOSIS — I10 Essential (primary) hypertension: Secondary | ICD-10-CM

## 2013-04-08 DIAGNOSIS — D696 Thrombocytopenia, unspecified: Secondary | ICD-10-CM

## 2013-04-08 DIAGNOSIS — J449 Chronic obstructive pulmonary disease, unspecified: Secondary | ICD-10-CM

## 2013-04-08 LAB — CBC & DIFF AND RETIC
BASO%: 0.3 % (ref 0.0–2.0)
Basophils Absolute: 0 10*3/uL (ref 0.0–0.1)
EOS%: 1 % (ref 0.0–7.0)
Eosinophils Absolute: 0.1 10*3/uL (ref 0.0–0.5)
HEMATOCRIT: 40.9 % (ref 38.4–49.9)
HGB: 14.5 g/dL (ref 13.0–17.1)
IMMATURE RETIC FRACT: 8.5 % (ref 3.00–10.60)
LYMPH#: 2.5 10*3/uL (ref 0.9–3.3)
LYMPH%: 31 % (ref 14.0–49.0)
MCH: 33.6 pg — ABNORMAL HIGH (ref 27.2–33.4)
MCHC: 35.5 g/dL (ref 32.0–36.0)
MCV: 94.9 fL (ref 79.3–98.0)
MONO#: 0.7 10*3/uL (ref 0.1–0.9)
MONO%: 8.5 % (ref 0.0–14.0)
NEUT%: 59.2 % (ref 39.0–75.0)
NEUTROS ABS: 4.7 10*3/uL (ref 1.5–6.5)
Platelets: 141 10*3/uL (ref 140–400)
RBC: 4.31 10*6/uL (ref 4.20–5.82)
RDW: 12.7 % (ref 11.0–14.6)
RETIC CT ABS: 146.97 10*3/uL — AB (ref 34.80–93.90)
Retic %: 3.41 % — ABNORMAL HIGH (ref 0.80–1.80)
WBC: 8 10*3/uL (ref 4.0–10.3)

## 2013-04-08 NOTE — Progress Notes (Signed)
   [facility]    OFFICE PROGRESS NOTE   INTERVAL HISTORY:   Theodore [name] returns for scheduled followup of thrombocytopenia. He feels well. He bruises easily. He relates this to xarelto. No other bleeding. Good appetite.  Objective:  Vital signs in last 24 hours:  There were no vitals taken for this visit.    HEENT: Neck without mass Lymphatics: No cervical, supraclavicular, or axillary nodes Resp: Lungs clear bilaterally Cardio: Regular rate and rhythm GI: No hepatosplenomegaly Vascular: No leg edema   Lab Results:  Lab Results  Component Value Date   WBC 8.0 04/08/2013   HGB 14.5 04/08/2013   HCT 40.9 04/08/2013   MCV 94.9 04/08/2013   PLT 141 04/08/2013   NEUTROABS 4.7 04/08/2013   Reticulocyte count 3.41% (147)   Medications: I have reviewed the patient's current medications.  Assessment/Plan: 1. History of Mild thrombocytopenia -the platelet count is now in the low normal range 2. Lumbar fusion surgery 12/24/2009. 3. Variation in red cell size and shape on the peripheral blood smear 02/18/2010. The MCV is now normal. Mild elevation of the reticulocyte count today and in 2012 4. Cervical spine surgery in 2009. 5. Right knee replacement in 2006. 6. Mild chronic obstructive pulmonary disease. 7. Hypertension. 8. Hypercholesterolemia. 9. History of Mild lower extremity edema. 10. Quadriceps tendon rupture in 2007.   Disposition:  The platelet count remains in the low normal range. He has a mildly elevated reticulocyte count, but he is not anemic. The hemoglobin has not changed significantly compared to 2012. There is no clinical evidence of a hematologic malignancy.  Theodore [name] will be discharged from the hematology clinic. He knows to seek medical attention for spontaneous bleeding/bruising or symptoms of anemia. He will ask Dr. Mare Ferrari to check his hemoglobin and platelets at least yearly. We will be glad to see him in the future as  needed.   Betsy Coder, MD  04/08/2013  8:58 AM

## 2013-04-08 NOTE — Telephone Encounter (Signed)
MD sent two pof's...per pt Dr. Benay Spice told him to just call if he need appt.  Did not sched per pt request and per 2nd pof sent.

## 2013-05-20 ENCOUNTER — Other Ambulatory Visit (INDEPENDENT_AMBULATORY_CARE_PROVIDER_SITE_OTHER): Payer: Medicare Other

## 2013-05-20 DIAGNOSIS — E785 Hyperlipidemia, unspecified: Secondary | ICD-10-CM

## 2013-05-20 LAB — HEPATIC FUNCTION PANEL
ALBUMIN: 4.2 g/dL (ref 3.5–5.2)
ALT: 25 U/L (ref 0–53)
AST: 28 U/L (ref 0–37)
Alkaline Phosphatase: 59 U/L (ref 39–117)
Bilirubin, Direct: 0.1 mg/dL (ref 0.0–0.3)
TOTAL PROTEIN: 6.9 g/dL (ref 6.0–8.3)
Total Bilirubin: 0.9 mg/dL (ref 0.3–1.2)

## 2013-05-20 LAB — BASIC METABOLIC PANEL
BUN: 13 mg/dL (ref 6–23)
CALCIUM: 9.1 mg/dL (ref 8.4–10.5)
CO2: 27 mEq/L (ref 19–32)
Chloride: 95 mEq/L — ABNORMAL LOW (ref 96–112)
Creatinine, Ser: 0.9 mg/dL (ref 0.4–1.5)
GFR: 85.94 mL/min (ref >60.00)
Glucose, Bld: 91 mg/dL (ref 70–99)
Potassium: 3.9 mEq/L (ref 3.5–5.1)
SODIUM: 131 meq/L — AB (ref 135–145)

## 2013-05-20 LAB — LIPID PANEL
CHOLESTEROL: 153 mg/dL (ref 0–200)
HDL: 55.2 mg/dL (ref >39.00)
LDL Cholesterol: 86 mg/dL (ref 0–99)
Total CHOL/HDL Ratio: 3
Triglycerides: 61 mg/dL (ref 0.0–149.0)
VLDL: 12.2 mg/dL (ref 0.0–40.0)

## 2013-05-20 NOTE — Progress Notes (Signed)
Quick Note:  Please make copy of labs for patient visit. ______ 

## 2013-05-27 ENCOUNTER — Ambulatory Visit (INDEPENDENT_AMBULATORY_CARE_PROVIDER_SITE_OTHER): Payer: Medicare Other | Admitting: Cardiology

## 2013-05-27 ENCOUNTER — Encounter: Payer: Self-pay | Admitting: Cardiology

## 2013-05-27 VITALS — BP 124/82 | HR 59 | Ht 73.0 in | Wt 220.0 lb

## 2013-05-27 DIAGNOSIS — I4510 Unspecified right bundle-branch block: Secondary | ICD-10-CM

## 2013-05-27 DIAGNOSIS — D696 Thrombocytopenia, unspecified: Secondary | ICD-10-CM | POA: Insufficient documentation

## 2013-05-27 DIAGNOSIS — I480 Paroxysmal atrial fibrillation: Secondary | ICD-10-CM

## 2013-05-27 DIAGNOSIS — I4891 Unspecified atrial fibrillation: Secondary | ICD-10-CM

## 2013-05-27 DIAGNOSIS — E785 Hyperlipidemia, unspecified: Secondary | ICD-10-CM

## 2013-05-27 DIAGNOSIS — I119 Hypertensive heart disease without heart failure: Secondary | ICD-10-CM

## 2013-05-27 DIAGNOSIS — I10 Essential (primary) hypertension: Secondary | ICD-10-CM

## 2013-05-27 MED ORDER — DILTIAZEM HCL ER COATED BEADS 180 MG PO CP24: 180.0000 mg | ORAL_CAPSULE | Freq: Every day | ORAL | Status: DC

## 2013-05-27 NOTE — Patient Instructions (Signed)
Your physician recommends that you continue on your current medications as directed. Please refer to the Current Medication list given to you today.  Your physician wants you to follow-up in: 4 months with fasting labs (lp/bmet/hfp/cbc)  You will receive a reminder letter in the mail two months in advance. If you don't receive a letter, please call our office to schedule the follow-up appointment.  

## 2013-05-27 NOTE — Assessment & Plan Note (Signed)
Patient is on Lipitor 40 mg daily.  Is not having any myalgias.  We reviewed his labs which were satisfactory

## 2013-05-27 NOTE — Assessment & Plan Note (Signed)
We had tried cutting back on his hydrochlorothiazide just 12.5 mg daily but his blood pressure shot up.  We put him back on 25 mg daily and his blood pressure returned to normal.  He feels well.  No chest pain or shortness of breath.  No dizziness or syncope.  No palpitations

## 2013-05-27 NOTE — Assessment & Plan Note (Signed)
His thrombocytopenia was discovered incidentally at the time of previous surgery.  He previously has seen oncology who recommends that we now just get a CBC yearly and follow him here.  We will do that at his next visit .

## 2013-05-27 NOTE — Assessment & Plan Note (Signed)
The patient has not been experiencing any awareness of atrial fibrillation.  He has not been having any problems from the Xarelto except for minor bruising.  He is not on aspirin.

## 2013-05-27 NOTE — Progress Notes (Signed)
[name] Date of Birth:  03/10/1934 [address] [place],   [address] [phone]         Fax   [phone]  History of Present Illness: This pleasant 79 year old gentleman is seen for a scheduled followup office visit. He has a past history of paroxysmal atrial fibrillation and a history of essential hypertension and dyslipidemia. He does not have any history of ischemic heart disease. He does have a history of asthma and is followed by Dr. Annamaria Boots. He has had a lot of orthopedic issues and problems with low back pain but that has recently been more stable.  In April 2014 he was found to be in paroxysmal atrial fibrillation.  Since then he has been on anticoagulation.  He was initially on Pradaxa and subsequently switched to Xarelto.  He has a past history of thrombocytopenia and has seen Dr. Learta Codding.  No treatment indicated.   Current Outpatient Prescriptions  Medication Sig Dispense Refill  . albuterol (PROVENTIL HFA;VENTOLIN HFA) 108 (90 BASE) MCG/ACT inhaler Inhale 2 puffs into the lungs every 6 (six) hours as needed for wheezing or shortness of breath.  1 Inhaler  prn  . Ascorbic Acid (VITAMIN C PO) Take 1 tablet by mouth daily.       [name] atorvastatin (LIPITOR) 40 MG tablet TAKE 1 TABLET DAILY  90 tablet  1  . diltiazem (CARDIZEM CD) 180 MG 24 hr capsule Take 1 capsule (180 mg total) by mouth daily.  90 capsule  3  . hydrochlorothiazide (HYDRODIURIL) 25 MG tablet Take 0.5 tablets (12.5 mg total) by mouth daily.  45 tablet  0  . losartan (COZAAR) 100 MG tablet TAKE 1 TABLET DAILY  90 tablet  0  . LYSINE PO Take 1 tablet by mouth daily.       . metoprolol tartrate (LOPRESSOR) 25 MG tablet TAKE 2 TABLETS IN THE MORNING AND 1 TABLET IN THE EVENING  270 tablet  0  . Naproxen Sodium (ALEVE PO) Take 1 capsule by mouth 2 (two) times daily.       [name] omeprazole (PRILOSEC) 20 MG capsule Take 20 mg by mouth daily.        . Rivaroxaban (XARELTO) 20 MG TABS tablet Take 1  tablet (20 mg total) by mouth daily with supper.  90 tablet  3  . Fluticasone-Salmeterol (ADVAIR HFA IN) Inhale into the lungs daily.        No current facility-administered medications for this visit.    Allergies  Allergen Reactions  . Ace Inhibitors   . Lopid [Gemfibrozil]   . Niacin And Related   . Penicillins   . Tamiflu     Rash,itching  . Zocor [Simvastatin]     Patient Active Problem List   Diagnosis Date Noted  . HYPERLIPIDEMIA 11/27/2007    Priority: Medium  . HYPERTENSION 11/27/2007    Priority: Medium  . DYSPNEA 11/18/2007    Priority: Medium  . Thrombocytopenia 05/27/2013  . Hyposmolality and/or hyponatremia 01/28/2013  . PVC's (premature ventricular contractions) 09/22/2012  . Insomnia 09/22/2012  . Osteoarthritis 06/18/2012  . Allergic-infective asthma 08/26/2011  . GERD (gastroesophageal reflux disease) 08/26/2011  . Tendinitis of elbow 02/12/2011  . Atrial fibrillation 01/21/2011  . Benign hypertensive heart disease without heart failure   . Hypercholesterolemia   . Peripheral edema   . Nocturia   . Right bundle branch block   . BRONCHITIS 12/28/2007    History  Smoking status  . Never Smoker  Smokeless tobacco  . Not on file    History  Alcohol Use No    Family History  Problem Relation Age of Onset  . Heart attack Father   . Heart disease Brother   . Heart disease Brother   . COPD Brother   . COPD Father   . Asthma Sister     Review of Systems: Constitutional: no fever chills diaphoresis or fatigue or change in weight.  Head and neck: no hearing loss, no epistaxis, no photophobia or visual disturbance. Respiratory: No cough, shortness of breath or wheezing. Cardiovascular: No chest pain peripheral edema, palpitations. Gastrointestinal: No abdominal distention, no abdominal pain, no change in bowel habits hematochezia or melena. Genitourinary: No dysuria, no frequency, no urgency, no nocturia. Musculoskeletal:No arthralgias, no  back pain, no gait disturbance or myalgias. Neurological: No dizziness, no headaches, no numbness, no seizures, no syncope, no weakness, no tremors. Hematologic: No lymphadenopathy, no easy bruising. Psychiatric: No confusion, no hallucinations, no sleep disturbance.    Physical Exam: Filed Vitals:   05/27/13 0756  BP: 124/82  Pulse: 59   general appearance reveals a well-developed well-nourished gentleman in no distress.The head and neck exam reveals pupils equal and reactive.  Extraocular movements are full.  There is no scleral icterus.  The mouth and pharynx are normal.  The neck is supple.  The carotids reveal no bruits.  The jugular venous pressure is normal.  The  thyroid is not enlarged.  There is no lymphadenopathy.  The chest is clear to percussion and auscultation.  There are no rales or rhonchi.  Expansion of the chest is symmetrical.  The precordium is quiet.  The first heart sound is normal.  The second heart sound is physiologically split.  There is no murmur gallop rub or click.  There is no abnormal lift or heave.  The abdomen is soft and nontender.  The bowel sounds are normal.  The liver and spleen are not enlarged.  There are no abdominal masses.  There are no abdominal bruits.  Extremities reveal good pedal pulses.  There is no phlebitis or edema.  There is no cyanosis or clubbing.  Strength is normal and symmetrical in all extremities.  There is no lateralizing weakness.  There are no sensory deficits.  The skin is warm and dry.  There is no rash.     Assessment / Plan: Continue same medication.  Recheck in 4 months for office visit CBC lipid panel hepatic function panel and basal metabolic panel.  Continue careful diet.

## 2013-06-18 ENCOUNTER — Other Ambulatory Visit: Payer: Self-pay | Admitting: Cardiology

## 2013-07-28 ENCOUNTER — Ambulatory Visit (INDEPENDENT_AMBULATORY_CARE_PROVIDER_SITE_OTHER): Payer: Medicare Other | Admitting: Internal Medicine

## 2013-07-28 ENCOUNTER — Encounter: Payer: Self-pay | Admitting: Internal Medicine

## 2013-07-28 VITALS — BP 118/72 | HR 52 | Ht 73.0 in | Wt 218.0 lb

## 2013-07-28 DIAGNOSIS — R0600 Dyspnea, unspecified: Secondary | ICD-10-CM

## 2013-07-28 DIAGNOSIS — J45909 Unspecified asthma, uncomplicated: Secondary | ICD-10-CM

## 2013-07-28 DIAGNOSIS — I4891 Unspecified atrial fibrillation: Secondary | ICD-10-CM

## 2013-07-28 DIAGNOSIS — R0989 Other specified symptoms and signs involving the circulatory and respiratory systems: Secondary | ICD-10-CM

## 2013-07-28 DIAGNOSIS — I480 Paroxysmal atrial fibrillation: Secondary | ICD-10-CM

## 2013-07-28 DIAGNOSIS — R0609 Other forms of dyspnea: Secondary | ICD-10-CM

## 2013-07-28 DIAGNOSIS — R0602 Shortness of breath: Secondary | ICD-10-CM

## 2013-07-28 DIAGNOSIS — J45998 Other asthma: Secondary | ICD-10-CM

## 2013-07-28 NOTE — Progress Notes (Signed)
Subjective:    Patient ID: Theodore [name], male    DOB: 18-Jan-1935, 79 y.o.   MRN: [id number]  HPI 07/25/10- 75 yoM followed for bronchitis and shortness of breath, complicated by HBP, peripheral edema. Last here July 26, 2009- Reviewed PFT from that visit- FEV1/FVC 0.65. Had back surgery in Mid-November without respiratory problems. He is walking regularly. Advair really helps, used once daily. Denies acute change, cough or wheeze.  Still follows with [name].   08/19/11- 75 yoM never smoker followed for bronchitis and shortness of breath, complicated by HBP, peripheral edema. Slight SOB with weather as humid as it is; denies any cough, congestion, or wheezing. He had no problem with anesthesia for a triceps repair. Advair doing well. Denies need for rescue inhaler. Cannot skip omeprazole for control of GERD CXR 02/23/11- IMPRESSION:  No acute cardiopulmonary disease.  Original Report Authenticated By: [name], M.D.   6//24/14-77 yoF followed for bronchitis and shortness of breath, complicated by HBP, peripheral edema FOLLOWS FOR: still has hoarseness and short winded at times. Hoarseness occasionally clears.  Thinks GERD is controlled with omeprazole. Dyspnea exertion climbing stairs is unchanged. Prefers Advair over Symbicort-no wheezing.  07/28/13-78 yoF followed for bronchitis and shortness of breath, complicated by HBP, peripheral edema,  GERD FOLLOWS FOR: Have slight increased SOB-thinks it his Afib episode that is causing this-is being followed by Cardiology. Dyspnea mostly with exertion, otherwise breathing good except for the hot weather. No cough or wheeze. Not anemic when last checked in February, on Xarelto.  CXR 08/18/12 IMPRESSION:  Stable appearance of the chest. No evidence for active chest  disease.  Original Report Authenticated By: [name], M.D.   ROS-see HPI Constitutional:   No-   weight loss, night sweats, fevers, chills, fatigue,  lassitude. HEENT:   No-  headaches, difficulty swallowing, tooth/dental problems, sore throat,       No-  sneezing, itching, ear ache, nasal congestion, post nasal drip,  CV:  No-   chest pain, orthopnea, PND, swelling in lower extremities, anasarca, dizziness, palpitations Resp: + shortness of breath with exertion or at rest.              No-   productive cough,  No non-productive cough,  No- coughing up of blood.              No-   change in color of mucus.  No- wheezing.   Skin: No-   rash or lesions. GI:  No-   heartburn, indigestion, abdominal pain, nausea, vomiting,  GU:  MS:  No-   joint pain or swelling.  Neuro-     nothing unusual Psych:  No- change in mood or affect. No depression or anxiety.  No memory loss.  OBJ- Physical Exam General- Alert, Oriented, Affect-appropriate, Distress- none acute, looks fit and trim Skin- +ecchymoses on arms  Lymphadenopathy- none Head- atraumatic            Eyes- Gross vision intact, PERRLA, conjunctivae and secretions clear            Ears- Hearing, canals-normal            Nose- Clear, no-Septal dev, mucus, polyps, erosion, perforation             Throat- Mallampati II , mucosa clear , drainage- none, tonsils- atrophic,  Neck- flexible , trachea midline, no stridor , thyroid nl, carotid no bruit Chest - symmetrical excursion , unlabored  Heart/CV- IRR , no murmur , no gallop  , no rub, nl s1 s2                           - JVD- none , edema- none, stasis changes- none, varices- none           Lung- clear to P&A, wheeze- none, cough- none , dullness-none, rub- none           Chest wall-  Abd-  Br/ Gen/ Rectal- Not done, not indicated Extrem- cyanosis- none, clubbing, none, atrophy- none, strength- nl Neuro- grossly intact to observation

## 2013-07-28 NOTE — Patient Instructions (Signed)
Order- future- schedule PFT dx dyspnea  Please go ahead and call [name] about your atrial fibrillation, since it has you feeling uncomfortable

## 2013-07-31 NOTE — Assessment & Plan Note (Signed)
I agree with his impression that recent history with exertion probably reflects interval of atrial fibrillation. Plan-schedule PFT

## 2013-07-31 NOTE — Assessment & Plan Note (Signed)
Controlled ventricular response rate 

## 2013-07-31 NOTE — Assessment & Plan Note (Signed)
Plan PFT 

## 2013-08-02 ENCOUNTER — Telehealth: Payer: Self-pay | Admitting: Cardiology

## 2013-08-02 NOTE — Telephone Encounter (Signed)
New message     Patient seen Dr. Annamaria Boots recently that he was going through some Afib issues.  Wanted Dr. Mare Ferrari to know.    Blood pressure today 135/80.

## 2013-08-02 NOTE — Telephone Encounter (Signed)
Patient seen by Dr Annamaria Boots and called as recommended. Patient does think he has been out of rhythm for about a week. Did confirm he is taking Xarelto. Patient denies fast heart rate. Patient scheduled to see  Dr. Mare Ferrari 8/14. Offered appointment next week when  Dr. Mare Ferrari back in office. States he will just call back next week if he wants to be seen sooner.

## 2013-08-09 ENCOUNTER — Telehealth: Payer: Self-pay | Admitting: Cardiology

## 2013-08-09 NOTE — Telephone Encounter (Signed)
FYI     Pt called stated feeling better and can wait for appointment  Early August.

## 2013-08-09 NOTE — Telephone Encounter (Signed)
Left message if needs anything before August to call back

## 2013-08-18 ENCOUNTER — Ambulatory Visit: Payer: Medicare Other | Admitting: Internal Medicine

## 2013-08-28 ENCOUNTER — Other Ambulatory Visit: Payer: Self-pay | Admitting: Cardiology

## 2013-08-29 NOTE — Telephone Encounter (Signed)
Darlin Coco, MD at 05/27/2013  8:20 AM Rivaroxaban (XARELTO) 20 MG TABS tablet  Take 1 tablet (20 mg total) by mouth daily with supper.   90 tablet   3   Your physician recommends that you continue on your current medications as directed. Please refer to the Current Medication list given to you today.

## 2013-09-16 ENCOUNTER — Other Ambulatory Visit (INDEPENDENT_AMBULATORY_CARE_PROVIDER_SITE_OTHER): Payer: Medicare Other

## 2013-09-16 DIAGNOSIS — E785 Hyperlipidemia, unspecified: Secondary | ICD-10-CM

## 2013-09-16 DIAGNOSIS — I119 Hypertensive heart disease without heart failure: Secondary | ICD-10-CM

## 2013-09-16 DIAGNOSIS — D696 Thrombocytopenia, unspecified: Secondary | ICD-10-CM

## 2013-09-16 LAB — CBC WITH DIFFERENTIAL/PLATELET
Basophils Absolute: 0 10*3/uL (ref 0.0–0.1)
Basophils Relative: 0.3 % (ref 0.0–3.0)
EOS PCT: 0.6 % (ref 0.0–5.0)
Eosinophils Absolute: 0 10*3/uL (ref 0.0–0.7)
HCT: 41.9 % (ref 39.0–52.0)
HEMOGLOBIN: 13.6 g/dL (ref 13.0–17.0)
Lymphocytes Relative: 30.8 % (ref 12.0–46.0)
Lymphs Abs: 2.2 10*3/uL (ref 0.7–4.0)
MCHC: 32.5 g/dL (ref 30.0–36.0)
MCV: 95.2 fl (ref 78.0–100.0)
MONOS PCT: 9.5 % (ref 3.0–12.0)
Monocytes Absolute: 0.7 10*3/uL (ref 0.1–1.0)
NEUTROS ABS: 4.2 10*3/uL (ref 1.4–7.7)
NEUTROS PCT: 58.8 % (ref 43.0–77.0)
Platelets: 143 10*3/uL — ABNORMAL LOW (ref 150.0–400.0)
RBC: 4.4 Mil/uL (ref 4.22–5.81)
RDW: 17.5 % — ABNORMAL HIGH (ref 11.5–15.5)
WBC: 7.1 10*3/uL (ref 4.0–10.5)

## 2013-09-16 LAB — BASIC METABOLIC PANEL
BUN: 13 mg/dL (ref 6–23)
CALCIUM: 9 mg/dL (ref 8.4–10.5)
CO2: 29 meq/L (ref 19–32)
CREATININE: 1 mg/dL (ref 0.4–1.5)
Chloride: 96 mEq/L (ref 96–112)
GFR: 74.43 mL/min (ref >60.00)
Glucose, Bld: 102 mg/dL — ABNORMAL HIGH (ref 70–99)
Potassium: 3.6 mEq/L (ref 3.5–5.1)
SODIUM: 133 meq/L — AB (ref 135–145)

## 2013-09-16 LAB — HEPATIC FUNCTION PANEL
ALBUMIN: 3.9 g/dL (ref 3.5–5.2)
ALT: 23 U/L (ref 0–53)
AST: 20 U/L (ref 0–37)
Alkaline Phosphatase: 57 U/L (ref 39–117)
Bilirubin, Direct: 0 mg/dL (ref 0.0–0.3)
TOTAL PROTEIN: 6.7 g/dL (ref 6.0–8.3)
Total Bilirubin: 0.7 mg/dL (ref 0.2–1.2)

## 2013-09-16 LAB — LIPID PANEL
Cholesterol: 149 mg/dL (ref 0–200)
HDL: 57.4 mg/dL (ref >39.00)
LDL Cholesterol: 77 mg/dL (ref 0–99)
NonHDL: 91.6
TRIGLYCERIDES: 71 mg/dL (ref 0.0–149.0)
Total CHOL/HDL Ratio: 3
VLDL: 14.2 mg/dL (ref 0.0–40.0)

## 2013-09-19 NOTE — Progress Notes (Signed)
Quick Note:  Please make copy of labs for patient visit. ______ 

## 2013-09-23 ENCOUNTER — Ambulatory Visit (INDEPENDENT_AMBULATORY_CARE_PROVIDER_SITE_OTHER): Payer: Medicare Other | Admitting: Cardiology

## 2013-09-23 ENCOUNTER — Encounter: Payer: Self-pay | Admitting: Cardiology

## 2013-09-23 VITALS — BP 137/64 | HR 40 | Ht 73.0 in | Wt 216.8 lb

## 2013-09-23 DIAGNOSIS — I119 Hypertensive heart disease without heart failure: Secondary | ICD-10-CM

## 2013-09-23 DIAGNOSIS — I480 Paroxysmal atrial fibrillation: Secondary | ICD-10-CM

## 2013-09-23 DIAGNOSIS — R1011 Right upper quadrant pain: Secondary | ICD-10-CM

## 2013-09-23 DIAGNOSIS — I4891 Unspecified atrial fibrillation: Secondary | ICD-10-CM

## 2013-09-23 DIAGNOSIS — E785 Hyperlipidemia, unspecified: Secondary | ICD-10-CM

## 2013-09-23 MED ORDER — HYDROCHLOROTHIAZIDE 25 MG PO TABS: ORAL_TABLET | ORAL | Status: DC

## 2013-09-23 MED ORDER — POTASSIUM CHLORIDE ER 10 MEQ PO TBCR: 10.0000 meq | EXTENDED_RELEASE_TABLET | Freq: Every day | ORAL | Status: DC

## 2013-09-23 NOTE — Patient Instructions (Addendum)
DECREASE YOUR HYDROCHLOROTHIAZIDE TO 1/2 TABLET DAILY  START KDUR (POTASSIUM) 10 MEQ DAILY, RX SENT TO [facility]  DECREASE YOUR METOPROLOL TO 25 MG TWICE A DAY  Your physician recommends that you schedule a follow-up appointment in: 4 months with fasting labs (lp/bmet/hfp/tsh)   You are scheduled for Abdominal Ultrasound Friday 09/30/13 at [facility], [place]. Arrive at 8:30, nothing to eat or drink after midnight.

## 2013-09-23 NOTE — Assessment & Plan Note (Signed)
His liver function studies are normal.  He has been having some intermittent right upper quadrant dull discomfort.  It occasionally radiates to the back.  We will check a abdominal ultrasound to rule out cholelithiasis.

## 2013-09-23 NOTE — Assessment & Plan Note (Signed)
Blood pressure is stable on current therapy.  However his pulse is too slow.  We will reduce his metoprolol to just 25 mg twice a day to allow a higher heart rate.  He has frequent PVCs.  His potassium is 3.6 and we will reduce his HCTZ to just 12.5 mg daily and we will add supplemental K. Dur 10 mEq one daily to help with his rhythm

## 2013-09-23 NOTE — Assessment & Plan Note (Signed)
The patient has not been aware of any racing of his heart.  He is on Xarelto.  He has mild easy bruising.  No TIA symptoms.

## 2013-09-23 NOTE — Progress Notes (Addendum)
Theodore [name] Date of Birth:  10/30/1934 [facility] [address] [name], [place]  [address] [phone]        Fax   [phone]   History of Present Illness: This pleasant 79 year old gentleman is seen for a scheduled followup office visit. He has a past history of paroxysmal atrial fibrillation and a history of essential hypertension and dyslipidemia. He does not have any history of ischemic heart disease. He does have a history of asthma and is followed by Dr. Annamaria [name]. He has had a lot of orthopedic issues and problems with low back pain but that has recently been more stable. In April 2014 he was found to be in paroxysmal atrial fibrillation. Since then he has been on anticoagulation. He was initially on Pradaxa and subsequently switched to Xarelto. He has a past history of thrombocytopenia and has seen Dr. Learta [name]. No treatment indicated.  Platelets this time are stable at 143,000. The patient has had decreased energy.  He has noted a slow pulse.  He has also had some right upper quadrant discomfort.   Current Outpatient Prescriptions  Medication Sig Dispense Refill  . albuterol (PROVENTIL HFA;VENTOLIN HFA) 108 (90 BASE) MCG/ACT inhaler Inhale 2 puffs into the lungs every 6 (six) hours as needed for wheezing or shortness of breath.  1 Inhaler  prn  . Ascorbic Acid (VITAMIN C PO) Take 1 tablet by mouth daily.       [name] atorvastatin (LIPITOR) 40 MG tablet TAKE 1 TABLET DAILY  90 tablet  1  . diltiazem (CARDIZEM CD) 180 MG 24 hr capsule Take 1 capsule (180 mg total) by mouth daily.  90 capsule  3  . hydrochlorothiazide (HYDRODIURIL) 25 MG tablet TAKE ONE-HALF (1/2) TABLET (12.5 MG) DAILY  45 tablet  1  . losartan (COZAAR) 100 MG tablet TAKE 1 TABLET DAILY  90 tablet  1  . LYSINE PO Take 1 tablet by mouth daily.       . metoprolol tartrate (LOPRESSOR) 25 MG tablet TAKE 2 TABLETS IN THE MORNING AND 1 TABLET IN THE EVENING  270 tablet  1  . Naproxen Sodium (ALEVE PO)  Take 1 capsule by mouth 2 (two) times daily.       [name] omeprazole (PRILOSEC) 20 MG capsule Take 20 mg by mouth daily.        Alveda Reasons 20 MG TABS tablet TAKE 1 TABLET DAILY WITH SUPPER  90 tablet  1   No current facility-administered medications for this visit.    Allergies  Allergen Reactions  . Ace Inhibitors   . Lopid [Gemfibrozil]   . Niacin And Related   . Penicillins   . Tamiflu     Rash,itching  . Zocor [Simvastatin]     Patient Active Problem List   Diagnosis Date Noted  . HYPERLIPIDEMIA 11/27/2007    Priority: Medium  . HYPERTENSION 11/27/2007    Priority: Medium  . DYSPNEA 11/18/2007    Priority: Medium  . Right upper quadrant abdominal pain 09/23/2013  . Thrombocytopenia 05/27/2013  . Hyposmolality and/or hyponatremia 01/28/2013  . PVC's (premature ventricular contractions) 09/22/2012  . Insomnia 09/22/2012  . Osteoarthritis 06/18/2012  . Allergic-infective asthma 08/26/2011  . GERD (gastroesophageal reflux disease) 08/26/2011  . Tendinitis of elbow 02/12/2011  . Atrial fibrillation 01/21/2011  . Benign hypertensive heart disease without heart failure   . Hypercholesterolemia   . Peripheral edema   . Nocturia   . Right bundle branch block   .  BRONCHITIS 12/28/2007    History  Smoking status  . Never Smoker   Smokeless tobacco  . Not on file    History  Alcohol Use No    Family History  Problem Relation Age of Onset  . Heart attack Father   . Heart disease Brother   . Heart disease Brother   . COPD Brother   . COPD Father   . Asthma Sister     Review of Systems: Constitutional: no fever chills diaphoresis or fatigue or change in weight.  Head and neck: no hearing loss, no epistaxis, no photophobia or visual disturbance. Respiratory: No cough, shortness of breath or wheezing. Cardiovascular: No chest pain peripheral edema, palpitations. Gastrointestinal: No abdominal distention, no abdominal pain, no change in bowel habits hematochezia or  melena. Genitourinary: No dysuria, no frequency, no urgency, no nocturia. Musculoskeletal:No arthralgias, no back pain, no gait disturbance or myalgias. Neurological: No dizziness, no headaches, no numbness, no seizures, no syncope, no weakness, no tremors. Hematologic: No lymphadenopathy, no easy bruising. Psychiatric: No confusion, no hallucinations, no sleep disturbance.    Physical Exam: Filed Vitals:   09/23/13 0822  BP: 137/64  Pulse: 40   the general appearance reveals a well-developed well-nourished gentleman in no distress.The head and neck exam reveals pupils equal and reactive.  Extraocular movements are full.  There is no scleral icterus.  The mouth and pharynx are normal.  The neck is supple.  The carotids reveal no bruits.  The jugular venous pressure is normal.  The  thyroid is not enlarged.  There is no lymphadenopathy.  The chest is clear to percussion and auscultation.  There are no rales or rhonchi.  Expansion of the chest is symmetrical.  The precordium is quiet.  The pulse is slow and irregular with PVCs.  The first heart sound is normal.  The second heart sound is physiologically split.  There is no murmur gallop rub or click.  There is no abnormal lift or heave.  The abdomen is soft and mildly tender in the right upper quadrant..  The bowel sounds are normal.  The liver and spleen are not enlarged.  There are no abdominal masses.  There are no abdominal bruits.  Extremities reveal good pedal pulses.  There is no phlebitis or edema.  There is no cyanosis or clubbing.  Strength is normal and symmetrical in all extremities.  There is no lateralizing weakness.  There are no sensory deficits.  The skin is warm and dry.  There is no rash.  EKG today shows sinus bradycardia with frequent PVCs and fusion beats.  There is right bundle branch block.   Assessment / Plan: 1. paroxysmal atrial fibrillation.  Presently sinus bradycardia. 2. hypertensive heart disease without heart  failure 3. frequent PVCs 4. right bundle branch block 5. Hypercholesterolemia 6. right upper quadrant pain 7. history of thrombocytopenia 8. Osteoarthritis  Plan: Reduce metoprolol to just 25 mg twice a day Reduce HCTZ to 12.5 Start K. Dur 10 mEq daily and eat high potassium foods Abdominal ultrasound to rule out cholelithiasis Recheck in 4 months for followup office visit EKG lipid panel hepatic function panel and basal metabolic panel and TSH

## 2013-09-30 ENCOUNTER — Ambulatory Visit
Admission: RE | Admit: 2013-09-30 | Discharge: 2013-09-30 | Disposition: A | Discharge status: Home / Self Care | Payer: Medicare Other | Source: Ambulatory Visit | Attending: Cardiology | Admitting: Cardiology

## 2013-09-30 DIAGNOSIS — R1011 Right upper quadrant pain: Secondary | ICD-10-CM

## 2013-10-04 ENCOUNTER — Other Ambulatory Visit: Payer: Self-pay | Admitting: *Deleted

## 2013-10-05 ENCOUNTER — Other Ambulatory Visit: Payer: Self-pay | Admitting: *Deleted

## 2013-10-05 MED ORDER — HYDROCHLOROTHIAZIDE 25 MG PO TABS
ORAL_TABLET | ORAL | Status: DC
Start: 2013-10-05 — End: 2014-01-18

## 2013-10-11 ENCOUNTER — Other Ambulatory Visit: Payer: Self-pay | Admitting: Cardiology

## 2013-11-30 ENCOUNTER — Ambulatory Visit (INDEPENDENT_AMBULATORY_CARE_PROVIDER_SITE_OTHER): Payer: Medicare Other | Admitting: Internal Medicine

## 2013-11-30 DIAGNOSIS — J45998 Other asthma: Secondary | ICD-10-CM

## 2013-11-30 LAB — PULMONARY FUNCTION TEST
DL/VA % PRED: 92 %
DL/VA: 4.29 ml/min/mmHg/L
DLCO unc % pred: 78 %
DLCO unc: 26.34 ml/min/mmHg
FEF 25-75 POST: 1.39 L/s
FEF 25-75 Pre: 1.08 L/sec
FEF2575-%CHANGE-POST: 28 %
FEF2575-%PRED-PRE: 48 %
FEF2575-%Pred-Post: 62 %
FEV1-%Change-Post: 4 %
FEV1-%PRED-PRE: 67 %
FEV1-%Pred-Post: 70 %
FEV1-POST: 2.21 L
FEV1-Pre: 2.1 L
FEV1FVC-%CHANGE-POST: 0 %
FEV1FVC-%Pred-Pre: 88 %
FEV6-%CHANGE-POST: 6 %
FEV6-%Pred-Post: 84 %
FEV6-%Pred-Pre: 79 %
FEV6-PRE: 3.21 L
FEV6-Post: 3.43 L
FEV6FVC-%Change-Post: 0 %
FEV6FVC-%Pred-Post: 104 %
FEV6FVC-%Pred-Pre: 103 %
FVC-%Change-Post: 5 %
FVC-%PRED-POST: 81 %
FVC-%Pred-Pre: 76 %
FVC-Post: 3.49 L
FVC-Pre: 3.3 L
POST FEV6/FVC RATIO: 98 %
PRE FEV1/FVC RATIO: 64 %
Post FEV1/FVC ratio: 63 %
Pre FEV6/FVC Ratio: 97 %
RV % pred: 121 %
RV: 3.21 L
TLC % pred: 97 %
TLC: 7.1 L

## 2013-11-30 NOTE — Progress Notes (Signed)
PFT done today. 

## 2013-12-01 ENCOUNTER — Encounter: Payer: Self-pay | Admitting: Internal Medicine

## 2013-12-01 ENCOUNTER — Ambulatory Visit: Payer: Medicare Other | Admitting: Internal Medicine

## 2013-12-01 ENCOUNTER — Other Ambulatory Visit: Payer: Medicare Other

## 2013-12-01 VITALS — BP 122/82 | HR 62 | Ht 72.0 in | Wt 220.2 lb

## 2013-12-01 DIAGNOSIS — R0600 Dyspnea, unspecified: Secondary | ICD-10-CM

## 2013-12-01 DIAGNOSIS — R0689 Other abnormalities of breathing: Principal | ICD-10-CM

## 2013-12-01 NOTE — Patient Instructions (Signed)
Order- lab-  Alpha 1 antiTrypsin level and phenotype  Sample Anoro inhaler     1 puff, once daily  See if this affects your sense of shortness of breath with activity.

## 2013-12-01 NOTE — Progress Notes (Signed)
Subjective:    Patient ID: Theodore [name], male    DOB: 03/13/1934, 79 y.o.   MRN: [id number]  HPI 07/25/10- 75 yoM followed for bronchitis and shortness of breath, complicated by HBP, peripheral edema. Last here July 26, 2009- Reviewed PFT from that visit- FEV1/FVC 0.65. Had back surgery in Mid-November without respiratory problems. He is walking regularly. Advair really helps, used once daily. Denies acute change, cough or wheeze.  Still follows with [name].   08/19/11- 75 yoM never smoker followed for bronchitis and shortness of breath, complicated by HBP, peripheral edema. Slight SOB with weather as humid as it is; denies any cough, congestion, or wheezing. He had no problem with anesthesia for a triceps repair. Advair doing well. Denies need for rescue inhaler. Cannot skip omeprazole for control of GERD CXR 02/23/11- IMPRESSION:  No acute cardiopulmonary disease.  Original Report Authenticated By: [name], M.D.   6//24/14-77 yoM followed for bronchitis and shortness of breath, complicated by HBP, peripheral edema FOLLOWS FOR: still has hoarseness and short winded at times. Hoarseness occasionally clears.  Thinks GERD is controlled with omeprazole. Dyspnea exertion climbing stairs is unchanged. Prefers Advair over Symbicort-no wheezing.  07/28/13-78 yoM followed for bronchitis and shortness of breath, complicated by HBP, peripheral edema,  GERD FOLLOWS FOR: Have slight increased SOB-thinks it his Afib episode that is causing this-is being followed by Cardiology. Dyspnea mostly with exertion, otherwise breathing good except for the hot weather. No cough or wheeze. Not anemic when last checked in February, on Xarelto.  CXR 07/12/12 IMPRESSION:  Stable appearance of the chest. No evidence for active chest  disease.  Original Report Authenticated By: [name], M.D.  12/01/13- 34 yoM never smoker followed for bronchitis and shortness of breath, complicated by HBP,  peripheral edema,  GERD, PAFib FOLLOW FOR:  SOB, PFT results,  PFT 11/30/13- moderate obstruction, insignificant response to bronchodilator, airtrapping, minimal diffusion defect. FEV1 2.21/ 70%, FEV1/FVC 0.63, TLC 97%, DLCO 78%. Doing fine with wheeze/ cough, Does think more aware of DOE hills, stairs. Father smoked, died of emphysema. Brother smoked, bad COPD.  Paroxysmal AFib / [name]. Pt can feel when in AFib.  Pt asks about trying Advair again- made hoarse. Will try Anoro.   ROS-see HPI Constitutional:   No-   weight loss, night sweats, fevers, chills, fatigue, lassitude. HEENT:   No-  headaches, difficulty swallowing, tooth/dental problems, sore throat,       No-  sneezing, itching, ear ache, nasal congestion, post nasal drip,  CV:  No-   chest pain, orthopnea, PND, swelling in lower extremities, anasarca, dizziness, palpitations Resp: + shortness of breath with exertion or at rest.              No-   productive cough,  No non-productive cough,  No- coughing up of blood.              No-   change in color of mucus.  No- wheezing.   Skin: No-   rash or lesions. GI:  No-   heartburn, indigestion, abdominal pain, nausea, vomiting,  GU:  MS:  No-   joint pain or swelling.  Neuro-     nothing unusual Psych:  No- change in mood or affect. No depression or anxiety.  No memory loss.  OBJ- Physical Exam General- Alert, Oriented, Affect-appropriate, Distress- none acute, looks fit and trim Skin- +ecchymoses on arms  Lymphadenopathy- none Head- atraumatic  Eyes- Gross vision intact, PERRLA, conjunctivae and secretions clear            Ears- Hearing, canals-normal            Nose- Clear, no-Septal dev, mucus, polyps, erosion, perforation             Throat- Mallampati II , mucosa clear , drainage- none, tonsils- atrophic,  Neck- flexible , trachea midline, no stridor , thyroid nl, carotid no bruit Chest - symmetrical excursion , unlabored           Heart/CV- IRR , no murmur  , no gallop  , no rub, nl s1 s2                           - JVD- none , edema- none, stasis changes- none, varices- none           Lung- clear to P&A, wheeze- none, cough- none , dullness-none, rub- none           Chest wall-  Abd-  Br/ Gen/ Rectal- Not done, not indicated Extrem- cyanosis- none, clubbing, none, atrophy- none, strength- nl Neuro- grossly intact to observation

## 2013-12-06 ENCOUNTER — Telehealth: Payer: Self-pay | Admitting: Internal Medicine

## 2013-12-06 MED ORDER — UMECLIDINIUM-VILANTEROL 62.5-25 MCG/INH IN AEPB: 1.0000 | INHALATION_SPRAY | Freq: Every day | RESPIRATORY_TRACT | Status: DC

## 2013-12-06 MED ORDER — UMECLIDINIUM-VILANTEROL 62.5-25 MCG/INH IN AEPB: 1.0000 | INHALATION_SPRAY | Freq: Every day | RESPIRATORY_TRACT | Status: AC

## 2013-12-06 NOTE — Telephone Encounter (Signed)
I spoke with [facility]-the alpha 1 test can take up to 1 week to get done. We will keep him updated with results as we know them.

## 2013-12-06 NOTE — Telephone Encounter (Signed)
Called spoke with patient who reports the Anoro is "helping quite a bit" from the 10.22.15 ov.  Pt would like to try this medication for longer than the 7-day sample provided at the ov.  Pt requests a prescription be sent to [facility] for 90-day supply with 1 yrs worth of refills.  Pt does report that this medication is covered by his insurance but he is currently in the 'donut hole.'  Advised pt will send rx as requested Additional samples provided as his current sample will not last until his mail-order shipment arrives. Pt will pick these up at his convenience.  Nothing further needed at this time; will sign off

## 2013-12-06 NOTE — Telephone Encounter (Signed)
lmtcb X1 to make pt aware of time frame for results.

## 2013-12-07 NOTE — Telephone Encounter (Signed)
Pt aware that we will call him once the results are available; nothing more needed at this time.

## 2013-12-08 LAB — ALPHA-1 ANTITRYPSIN PHENOTYPE: A1 ANTITRYPSIN: 110 mg/dL (ref 83–199)

## 2013-12-20 ENCOUNTER — Other Ambulatory Visit: Payer: Self-pay | Admitting: Cardiology

## 2014-01-04 ENCOUNTER — Other Ambulatory Visit: Payer: Self-pay | Admitting: Cardiology

## 2014-01-11 ENCOUNTER — Other Ambulatory Visit (INDEPENDENT_AMBULATORY_CARE_PROVIDER_SITE_OTHER): Payer: Medicare Other | Admitting: *Deleted

## 2014-01-11 DIAGNOSIS — E785 Hyperlipidemia, unspecified: Secondary | ICD-10-CM

## 2014-01-11 DIAGNOSIS — I480 Paroxysmal atrial fibrillation: Secondary | ICD-10-CM

## 2014-01-11 DIAGNOSIS — I119 Hypertensive heart disease without heart failure: Secondary | ICD-10-CM

## 2014-01-11 LAB — LIPID PANEL
CHOLESTEROL: 151 mg/dL (ref 0–200)
HDL: 45.7 mg/dL (ref >39.00)
LDL Cholesterol: 89 mg/dL (ref 0–99)
NonHDL: 105.3
Total CHOL/HDL Ratio: 3
Triglycerides: 80 mg/dL (ref 0.0–149.0)
VLDL: 16 mg/dL (ref 0.0–40.0)

## 2014-01-11 LAB — HEPATIC FUNCTION PANEL
ALK PHOS: 62 U/L (ref 39–117)
ALT: 24 U/L (ref 0–53)
AST: 24 U/L (ref 0–37)
Albumin: 4 g/dL (ref 3.5–5.2)
BILIRUBIN DIRECT: 0 mg/dL (ref 0.0–0.3)
Total Bilirubin: 0.9 mg/dL (ref 0.2–1.2)
Total Protein: 6.5 g/dL (ref 6.0–8.3)

## 2014-01-11 LAB — BASIC METABOLIC PANEL
BUN: 18 mg/dL (ref 6–23)
CHLORIDE: 98 meq/L (ref 96–112)
CO2: 26 mEq/L (ref 19–32)
CREATININE: 1.2 mg/dL (ref 0.4–1.5)
Calcium: 9 mg/dL (ref 8.4–10.5)
GFR: 62.34 mL/min (ref >60.00)
GLUCOSE: 115 mg/dL — AB (ref 70–99)
Potassium: 3.8 mEq/L (ref 3.5–5.1)
Sodium: 131 mEq/L — ABNORMAL LOW (ref 135–145)

## 2014-01-11 LAB — TSH: TSH: 1.28 u[IU]/mL (ref 0.35–4.50)

## 2014-01-12 NOTE — Progress Notes (Signed)
Quick Note:  Please make copy of labs for patient visit. ______ 

## 2014-01-18 ENCOUNTER — Encounter: Payer: Self-pay | Admitting: Cardiology

## 2014-01-18 ENCOUNTER — Ambulatory Visit (INDEPENDENT_AMBULATORY_CARE_PROVIDER_SITE_OTHER): Payer: Medicare Other | Admitting: Cardiology

## 2014-01-18 VITALS — BP 130/80 | HR 57 | Ht 73.0 in | Wt 222.0 lb

## 2014-01-18 DIAGNOSIS — I10 Essential (primary) hypertension: Secondary | ICD-10-CM

## 2014-01-18 DIAGNOSIS — E871 Hypo-osmolality and hyponatremia: Secondary | ICD-10-CM

## 2014-01-18 DIAGNOSIS — I119 Hypertensive heart disease without heart failure: Secondary | ICD-10-CM

## 2014-01-18 DIAGNOSIS — I480 Paroxysmal atrial fibrillation: Secondary | ICD-10-CM

## 2014-01-18 DIAGNOSIS — R609 Edema, unspecified: Secondary | ICD-10-CM

## 2014-01-18 MED ORDER — POTASSIUM CHLORIDE ER 10 MEQ PO TBCR: 10.0000 meq | EXTENDED_RELEASE_TABLET | Freq: Two times a day (BID) | ORAL | Status: DC

## 2014-01-18 MED ORDER — FUROSEMIDE 20 MG PO TABS: 20.0000 mg | ORAL_TABLET | Freq: Every day | ORAL | Status: DC

## 2014-01-18 NOTE — Assessment & Plan Note (Signed)
We will switch him from hydrochlorothiazide to Lasix to see how the hyponatremia responds

## 2014-01-18 NOTE — Assessment & Plan Note (Signed)
The patient has a history of paroxysmal atrial fibrillation.  He has not been aware of any recent irregular heartbeat.  EKG today confirms normal sinus rhythm.  He remains on Xarelto.  No bleeding problems.

## 2014-01-18 NOTE — Progress Notes (Signed)
Theodore [name] Date of Birth:  [birth year] [facility] [address] [place], [place]  [address] [phone]        Fax   [phone]   History of Present Illness: This pleasant 79 year old gentleman is seen for a scheduled followup office visit. He has a past history of paroxysmal atrial fibrillation and a history of essential hypertension and dyslipidemia. He does not have any history of ischemic heart disease. He does have a history of asthma and is followed by Dr. Annamaria Boots.  Dr. Annamaria Boots recently did extensive pulmonary function studies and subsequently switched him to a different steroid-based inhaler.  He feels that the new inhaler has helped his exertional dyspnea.  He has had a lot of orthopedic issues and problems with low back pain but that has recently been more stable. In April 2014 he was found to be in paroxysmal atrial fibrillation. Since then he has been on anticoagulation. He was initially on Pradaxa and subsequently switched to Xarelto. He has a past history of thrombocytopenia and has seen Dr. Learta Codding. No treatment indicated.  The patient has been experiencing increased weight gain.  He states that he is not eating any more than usual.  He has been having edema present by the end of the day and resolves overnight.  He has nocturia several times at night. Current Outpatient Prescriptions  Medication Sig Dispense Refill  . albuterol (PROVENTIL HFA;VENTOLIN HFA) 108 (90 BASE) MCG/ACT inhaler Inhale 2 puffs into the lungs every 6 (six) hours as needed for wheezing or shortness of breath. 1 Inhaler prn  . Ascorbic Acid (VITAMIN C PO) Take 1 tablet by mouth daily.     [name] atorvastatin (LIPITOR) 40 MG tablet TAKE 1 TABLET DAILY 90 tablet 1  . diltiazem (CARDIZEM CD) 180 MG 24 hr capsule Take 1 capsule (180 mg total) by mouth daily. 90 capsule 3  . losartan (COZAAR) 100 MG tablet TAKE 1 TABLET DAILY 90 tablet 0  . LYSINE PO Take 1 tablet by mouth daily.     .  metoprolol tartrate (LOPRESSOR) 25 MG tablet Take 1 tablet twice a day    . Naproxen Sodium (ALEVE PO) Take 1 capsule by mouth 2 (two) times daily.     [name] omeprazole (PRILOSEC) 20 MG capsule Take 20 mg by mouth daily.      . potassium chloride (K-DUR) 10 MEQ tablet Take 1 tablet (10 mEq total) by mouth 2 (two) times daily. 180 tablet 3  . Umeclidinium-Vilanterol (ANORO ELLIPTA) 62.5-25 MCG/INH AEPB Inhale 1 puff into the lungs daily. 180 each 4  . XARELTO 20 MG TABS tablet TAKE 1 TABLET DAILY WITH SUPPER 90 tablet 1  . furosemide (LASIX) 20 MG tablet Take 1 tablet (20 mg total) by mouth daily. 90 tablet 3   No current facility-administered medications for this visit.    Allergies  Allergen Reactions  . Ace Inhibitors   . Lopid [Gemfibrozil]   . Niacin And Related   . Penicillins   . Tamiflu     Rash,itching  . Zocor [Simvastatin]     Patient Active Problem List   Diagnosis Date Noted  . HYPERLIPIDEMIA 11/27/2007    Priority: Medium  . HYPERTENSION 11/27/2007    Priority: Medium  . DYSPNEA 11/18/2007    Priority: Medium  . Right upper quadrant abdominal pain 09/23/2013  . Thrombocytopenia 05/27/2013  . Hyposmolality and/or hyponatremia 01/28/2013  . PVC's (premature ventricular contractions) 09/22/2012  . Insomnia 09/22/2012  .  Osteoarthritis 06/18/2012  . Allergic-infective asthma 08/26/2011  . GERD (gastroesophageal reflux disease) 08/26/2011  . Tendinitis of elbow 02/12/2011  . Atrial fibrillation 01/21/2011  . Benign hypertensive heart disease without heart failure   . Hypercholesterolemia   . Peripheral edema   . Nocturia   . Right bundle branch block   . BRONCHITIS 12/28/2007    History  Smoking status  . Never Smoker   Smokeless tobacco  . Not on file    History  Alcohol Use No    Family History  Problem Relation Age of Onset  . Heart attack Father   . Heart disease Brother   . Heart disease Brother   . COPD Brother   . COPD Father   . Asthma  Sister     Review of Systems: Constitutional: no fever chills diaphoresis or fatigue or change in weight.  Head and neck: no hearing loss, no epistaxis, no photophobia or visual disturbance. Respiratory: No cough, shortness of breath or wheezing. Cardiovascular: No chest pain peripheral edema, palpitations. Gastrointestinal: No abdominal distention, no abdominal pain, no change in bowel habits hematochezia or melena. Genitourinary: No dysuria, no frequency, no urgency, no nocturia. Musculoskeletal:No arthralgias, no back pain, no gait disturbance or myalgias. Neurological: No dizziness, no headaches, no numbness, no seizures, no syncope, no weakness, no tremors. Hematologic: No lymphadenopathy, no easy bruising. Psychiatric: No confusion, no hallucinations, no sleep disturbance.    Physical Exam: Filed Vitals:   01/18/14 0808  BP: 130/80  Pulse: 57   the general appearance reveals a well-developed well-nourished gentleman in no distress.The head and neck exam reveals pupils equal and reactive.  Extraocular movements are full.  There is no scleral icterus.  The mouth and pharynx are normal.  The neck is supple.  The carotids reveal no bruits.  The jugular venous pressure is normal.  The  thyroid is not enlarged.  There is no lymphadenopathy.  The chest is clear to percussion and auscultation.  There are no rales or rhonchi.  Expansion of the chest is symmetrical.  The precordium is quiet.  The pulse is slow and irregular with PVCs.  The first heart sound is normal.  The second heart sound is physiologically split.  There is no murmur gallop rub or click.  There is no abnormal lift or heave.  The abdomen is soft and mildly tender in the right upper quadrant..  The bowel sounds are normal.  The liver and spleen are not enlarged.  There are no abdominal masses.  There are no abdominal bruits.  Extremities reveal good pedal pulses.  There is no phlebitis or edema.  There is no cyanosis or clubbing.   Strength is normal and symmetrical in all extremities.  There is no lateralizing weakness.  There are no sensory deficits.  The skin is warm and dry.  There is no rash.  EKG today shows sinus bradycardia with left axis deviation and right bundle branch block.  Since prior tracing of 09/23/13, PVCs are absent   Assessment / Plan: 1. paroxysmal atrial fibrillation.  Presently sinus bradycardia. 2. hypertensive heart disease without heart failure 3. frequent PVCs 4. right bundle branch block 5. Hypercholesterolemia 6. right upper quadrant pain 7. history of thrombocytopenia 8. Osteoarthritis  Plan: Stop HCTZ.  Start furosemide 20 mg daily.  Increase potassium chloride 10 mEq up to twice a day. Recheck in 4 months for follow-up office visit lipid panel hepatic function panel and basal metabolic panel.  I told him that if the  peripheral edema does not resolve we might need to go to a stronger dose of Lasix.

## 2014-01-18 NOTE — Patient Instructions (Signed)
STOP HYDROCHLOROTHIAZIDE  START LASIX (FUROSEMIDE) 20 MG DAILY  INCREASE YOUR POTASSIUM 10 MEQ TO TWICE A DAY  Your physician wants you to follow-up in: 4 months with fasting labs (lp/bmet/hfp)  You will receive a reminder letter in the mail two months in advance. If you don't receive a letter, please call our office to schedule the follow-up appointment.

## 2014-01-18 NOTE — Assessment & Plan Note (Addendum)
Blood pressure is slightly high today.  He is on low-dose hydrochlorothiazide.  He is having weight gain and peripheral edema.  We will stop his HCTZ and start furosemide 20 mg daily

## 2014-02-23 ENCOUNTER — Encounter (HOSPITAL_COMMUNITY): Payer: Self-pay | Admitting: Cardiology

## 2014-02-28 ENCOUNTER — Other Ambulatory Visit: Payer: Self-pay | Admitting: Cardiology

## 2014-03-24 ENCOUNTER — Other Ambulatory Visit: Payer: Self-pay

## 2014-05-23 ENCOUNTER — Other Ambulatory Visit (INDEPENDENT_AMBULATORY_CARE_PROVIDER_SITE_OTHER): Payer: Medicare Other | Admitting: *Deleted

## 2014-05-23 DIAGNOSIS — I10 Essential (primary) hypertension: Secondary | ICD-10-CM

## 2014-05-23 LAB — HEPATIC FUNCTION PANEL
ALT: 21 U/L (ref 0–53)
AST: 24 U/L (ref 0–37)
Albumin: 4 g/dL (ref 3.5–5.2)
Alkaline Phosphatase: 71 U/L (ref 39–117)
BILIRUBIN DIRECT: 0.2 mg/dL (ref 0.0–0.3)
BILIRUBIN TOTAL: 0.8 mg/dL (ref 0.2–1.2)
Total Protein: 6.6 g/dL (ref 6.0–8.3)

## 2014-05-23 LAB — BASIC METABOLIC PANEL
BUN: 16 mg/dL (ref 6–23)
CO2: 27 meq/L (ref 19–32)
Calcium: 9.3 mg/dL (ref 8.4–10.5)
Chloride: 102 mEq/L (ref 96–112)
Creatinine, Ser: 1.13 mg/dL (ref 0.40–1.50)
GFR: 66.76 mL/min (ref >60.00)
Glucose, Bld: 110 mg/dL — ABNORMAL HIGH (ref 70–99)
Potassium: 4.3 mEq/L (ref 3.5–5.1)
Sodium: 134 mEq/L — ABNORMAL LOW (ref 135–145)

## 2014-05-23 LAB — LIPID PANEL
CHOL/HDL RATIO: 3
Cholesterol: 148 mg/dL (ref 0–200)
HDL: 53.8 mg/dL (ref >39.00)
LDL CALC: 77 mg/dL (ref 0–99)
NONHDL: 94.2
Triglycerides: 85 mg/dL (ref 0.0–149.0)
VLDL: 17 mg/dL (ref 0.0–40.0)

## 2014-05-23 NOTE — Progress Notes (Signed)
Quick Note:  Please make copy of labs for patient visit. ______ 

## 2014-05-26 ENCOUNTER — Ambulatory Visit (INDEPENDENT_AMBULATORY_CARE_PROVIDER_SITE_OTHER): Payer: Medicare Other | Admitting: Internal Medicine

## 2014-05-26 ENCOUNTER — Encounter: Payer: Self-pay | Admitting: Internal Medicine

## 2014-05-26 VITALS — BP 118/72 | HR 62 | Ht 72.0 in | Wt 219.8 lb

## 2014-05-26 DIAGNOSIS — J449 Chronic obstructive pulmonary disease, unspecified: Secondary | ICD-10-CM

## 2014-05-26 NOTE — Progress Notes (Signed)
Subjective:    Patient ID: Theodore [name], male    DOB: 02-09-1935, 80 y.o.   MRN: [id number]  HPI 07/25/10- 75 yoM followed for bronchitis and shortness of breath, complicated by HBP, peripheral edema. Last here July 26, 2009- Reviewed PFT from that visit- FEV1/FVC 0.65. Had back surgery in Mid-November without respiratory problems. He is walking regularly. Advair really helps, used once daily. Denies acute change, cough or wheeze.  Still follows with [name].   08/19/11- 75 yoM never smoker followed for bronchitis and shortness of breath, complicated by HBP, peripheral edema. Slight SOB with weather as humid as it is; denies any cough, congestion, or wheezing. He had no problem with anesthesia for a triceps repair. Advair doing well. Denies need for rescue inhaler. Cannot skip omeprazole for control of GERD CXR 02/23/11- IMPRESSION:  No acute cardiopulmonary disease.  Original Report Authenticated By: [name], M.D.   6//24/14-77 yoM followed for bronchitis and shortness of breath, complicated by HBP, peripheral edema FOLLOWS FOR: still has hoarseness and short winded at times. Hoarseness occasionally clears.  Thinks GERD is controlled with omeprazole. Dyspnea exertion climbing stairs is unchanged. Prefers Advair over Symbicort-no wheezing.  07/28/13-78 yoM followed for bronchitis and shortness of breath, complicated by HBP, peripheral edema,  GERD FOLLOWS FOR: Have slight increased SOB-thinks it his Afib episode that is causing this-is being followed by Cardiology. Dyspnea mostly with exertion, otherwise breathing good except for the hot weather. No cough or wheeze. Not anemic when last checked in February, on Xarelto.  CXR 07/12/12 IMPRESSION:  Stable appearance of the chest. No evidence for active chest  disease.  Original Report Authenticated By: [name], M.D.  12/01/13- 12 yoM never smoker followed for bronchitis and shortness of breath, complicated by HBP,  peripheral edema,  GERD, PAFib FOLLOW FOR:  SOB, PFT results,  PFT 11/30/13- moderate obstruction, insignificant response to bronchodilator, airtrapping, minimal diffusion defect. FEV1 2.21/ 70%, FEV1/FVC 0.63, TLC 97%, DLCO 78%. Doing fine with wheeze/ cough, Does think more aware of DOE hills, stairs. Father smoked, died of emphysema. Brother smoked, bad COPD.  Paroxysmal AFib / [name]. Pt can feel when in AFib.  Pt asks about trying Advair again- made hoarse. Will try Anoro.   05/26/14- 79 yoM never smoker followed for COPD/ bronchitis, complicated by HBP, peripheral edema,  GERD, PAFib FOLLOWS FOR: Pt states he has occasional wheezing/SOB when walking up the stairs;otherwise doing well. Anoro had no effect so he stopped it. Rarely uses rescue inhaler. He recognizes dyspnea primarily corresponds to episodes of his paroxysmal atrial fib. Otherwise feels much better.  ROS-see HPI Constitutional:   No-   weight loss, night sweats, fevers, chills, fatigue, lassitude. HEENT:   No-  headaches, difficulty swallowing, tooth/dental problems, sore throat,       No-  sneezing, itching, ear ache, nasal congestion, post nasal drip,  CV:  No-   chest pain, orthopnea, PND, swelling in lower extremities, anasarca, dizziness, palpitations Resp: + shortness of breath with exertion or at rest.              No-   productive cough,  No non-productive cough,  No- coughing up of blood.              No-   change in color of mucus.  No- wheezing.   Skin: No-   rash or lesions. GI:  No-   heartburn, indigestion, abdominal pain, nausea, vomiting,  GU:  MS:  No-   joint  pain or swelling.  Neuro-     nothing unusual Psych:  No- change in mood or affect. No depression or anxiety.  No memory loss.  OBJ- Physical Exam General- Alert, Oriented, Affect-appropriate, Distress- none acute, looks fit and trim Skin- +ecchymoses on arms  Lymphadenopathy- none Head- atraumatic            Eyes- Gross vision intact,  PERRLA, conjunctivae and secretions clear            Ears- Hearing, canals-normal            Nose- Clear, no-Septal dev, mucus, polyps, erosion, perforation             Throat- Mallampati II , mucosa clear , drainage- none, tonsils- atrophic,  Neck- flexible , trachea midline, no stridor , thyroid nl, carotid no bruit Chest - symmetrical excursion , unlabored           Heart/CV- RR , no murmur , no gallop  , no rub, nl s1 s2                           - JVD- none , edema- none, stasis changes- none, varices- none           Lung- + coarse but clear, wheeze- none, cough- none , dullness-none, rub- none           Chest wall-  Abd-  Br/ Gen/ Rectal- Not done, not indicated Extrem- cyanosis- none, clubbing, none, atrophy- none, strength- nl Neuro- grossly intact to observation

## 2014-05-26 NOTE — Patient Instructions (Signed)
Ok to use the albuterol rescue inhaler if needed  The exercise and walking is good for sustaining your ability to do what you want.  Please call if needed

## 2014-05-27 NOTE — Assessment & Plan Note (Signed)
PFT had shown a fixed obstructive component but he mostly associates dyspnea with episodes of atrial fibrillation. I doubt his a1AT MS phenotype is significant but we will recheck PFT every few years for trend. Plan-okay just to use rescue inhaler when needed without a maintenance controller now.

## 2014-05-30 ENCOUNTER — Ambulatory Visit (INDEPENDENT_AMBULATORY_CARE_PROVIDER_SITE_OTHER): Payer: Medicare Other | Admitting: Cardiology

## 2014-05-30 ENCOUNTER — Encounter: Payer: Self-pay | Admitting: Cardiology

## 2014-05-30 VITALS — BP 138/78 | HR 53 | Ht 72.0 in | Wt 218.1 lb

## 2014-05-30 DIAGNOSIS — R609 Edema, unspecified: Secondary | ICD-10-CM | POA: Diagnosis not present

## 2014-05-30 DIAGNOSIS — I10 Essential (primary) hypertension: Secondary | ICD-10-CM

## 2014-05-30 DIAGNOSIS — D696 Thrombocytopenia, unspecified: Secondary | ICD-10-CM | POA: Diagnosis not present

## 2014-05-30 DIAGNOSIS — I480 Paroxysmal atrial fibrillation: Secondary | ICD-10-CM

## 2014-05-30 NOTE — Progress Notes (Signed)
Cardiology Office Note   Date:  05/30/2014   ID:  Theodore [name], DOB [birth year], MRN [id number]  PCP:  Darlin Coco, MD  Cardiologist:   Darlin Coco, MD   No chief complaint on file.     History of Present Illness: Theodore [name] is a 80 y.o. male who presents for a scheduled four-month follow-up office visit.  This pleasant 80 year old gentleman is seen for a scheduled followup office visit. He has a past history of paroxysmal atrial fibrillation and a history of essential hypertension and dyslipidemia. He does not have any history of ischemic heart disease. He does have a history of asthma and is followed by Dr. Annamaria Boots. Dr. Annamaria Boots recently did extensive pulmonary function studies and subsequently switched him to a different steroid-based inhaler. He feels that the new inhaler has helped his exertional dyspnea. He has had a lot of orthopedic issues and problems with low back pain but that has recently been more stable. In April 2014 he was found to be in paroxysmal atrial fibrillation. Since then he has been on anticoagulation. He was initially on Pradaxa and subsequently switched to Xarelto. He has a past history of thrombocytopenia and has seen Dr. Learta Codding. No treatment indicated. His last platelet count was more than 140,000.  This was in the summer of 2015. Patient has not been experiencing any chest pain.  He has been experiencing some pain in his left leg.  He has a history of a remote injury to the left leg and the left leg has always been smaller than the right.  He is not having pain with walking but is having pain at rest.  The left second toe hurts him at night.  His left foot and looks dusky to him.  Past Medical History  Diagnosis Date  . Hypertension   . Hypercholesterolemia   . SOB (shortness of breath)   . Peripheral edema   . Nocturia   . Right bundle branch block   . PAF (paroxysmal atrial fibrillation) January 2013    Placed on Pradaxa. Did not require  cardioversion; spontaneously converted  . Tendonitis of elbow, left     Past Surgical History  Procedure Laterality Date  . Cardiac catheterization  2009    NONOBSTRUCTIVE ATHERSCLEROTIC CORONARY DISEASE AND NORMAL  LV FUNCTION  . Back surgery  12/24/09     Current Outpatient Prescriptions  Medication Sig Dispense Refill  . albuterol (PROVENTIL HFA;VENTOLIN HFA) 108 (90 BASE) MCG/ACT inhaler Inhale 2 puffs into the lungs every 6 (six) hours as needed for wheezing or shortness of breath. 1 Inhaler prn  . Ascorbic Acid (VITAMIN C PO) Take 1 tablet by mouth daily.     [name] atorvastatin (LIPITOR) 40 MG tablet TAKE 1 TABLET DAILY 90 tablet 1  . diltiazem (CARDIZEM CD) 180 MG 24 hr capsule Take 1 capsule (180 mg total) by mouth daily. 90 capsule 3  . furosemide (LASIX) 20 MG tablet Take 1 tablet (20 mg total) by mouth daily. 90 tablet 3  . losartan (COZAAR) 100 MG tablet TAKE 1 TABLET DAILY 90 tablet 0  . LYSINE PO Take 1 tablet by mouth daily.     . metoprolol tartrate (LOPRESSOR) 25 MG tablet TAKE 2 TABLETS IN THE MORNING AND 1 TABLET IN THE EVENING (Patient taking differently: TAKE 1 TABLETS IN THE MORNING AND 1 TABLET IN THE EVENING) 270 tablet 0  . Naproxen Sodium (ALEVE PO) Take 1 capsule by mouth 2 (two) times daily.     [name]  omeprazole (PRILOSEC) 20 MG capsule Take 20 mg by mouth daily.      . potassium chloride (K-DUR) 10 MEQ tablet Take 1 tablet (10 mEq total) by mouth 2 (two) times daily. 180 tablet 3  . XARELTO 20 MG TABS tablet TAKE 1 TABLET DAILY WITH SUPPER 90 tablet 0   No current facility-administered medications for this visit.    Allergies:   Ace inhibitors; Lopid; Niacin and related; Penicillins; Tamiflu; and Zocor    Social History:  The patient  reports that he has never smoked. He does not have any smokeless tobacco history on file. He reports that he does not drink alcohol or use illicit drugs.   Family History:  The patient's family history includes Asthma in his  sister; COPD in his brother and father; Heart attack in his father; Heart disease in his brother and brother.    ROS:  Please see the history of present illness.   Otherwise, review of systems are positive for none.   All other systems are reviewed and negative.    PHYSICAL EXAM: VS:  BP 138/78 mmHg  Pulse 53  Ht 6' (1.829 m)  Wt 218 lb 1.9 oz (98.939 kg)  BMI 29.58 kg/m2 , BMI Body mass index is 29.58 kg/(m^2). GEN: Well nourished, well developed, in no acute distress HEENT: normal Neck: no JVD, carotid bruits, or masses Cardiac: RRR; no murmurs, rubs, or gallops,no edema  Respiratory:  clear to auscultation bilaterally, normal work of breathing GI: soft, nontender, nondistended, + BS MS: no deformity or atrophy Skin: warm and dry, no rash Neuro:  Strength and sensation are intact Psych: euthymic mood, full affect   EKG:  EKG is not ordered today.    Recent Labs: 09/16/2013: Hemoglobin 13.6; Platelets 143.0* 01/11/2014: TSH 1.28 05/23/2014: ALT 21; BUN 16; Creatinine 1.13; Potassium 4.3; Sodium 134*    Lipid Panel    Component Value Date/Time   CHOL 148 05/23/2014 0741   TRIG 85.0 05/23/2014 0741   HDL 53.80 05/23/2014 0741   CHOLHDL 3 05/23/2014 0741   VLDL 17.0 05/23/2014 0741   LDLCALC 77 05/23/2014 0741      Wt Readings from Last 3 Encounters:  05/30/14 218 lb 1.9 oz (98.939 kg)  05/26/14 219 lb 12.8 oz (99.701 kg)  01/18/14 222 lb (100.699 kg)        ASSESSMENT AND PLAN:  1. paroxysmal atrial fibrillation. Presently sinus bradycardia. 2. hypertensive heart disease without heart failure 3. frequent PVCs, quiescent 4. right bundle branch block 5. Hypercholesterolemia 6. right upper quadrant pain resolved 7. history of thrombocytopenia 8. Osteoarthritis 9.  Left leg discomfort worse at night.  Pedal pulses in the left foot appeared to be diminished when compared to the right. Plan: Arrange for arterial Dopplers of his legs.  Following that if arterial  Dopplers are okay we will consider referral to orthopedic foot specialist to a podiatrist Recheck in 4 months for follow-up office visit lipid panel hepatic function panel and basal metabolic panel. And CBC   Current medicines are reviewed at length with the patient today.  The patient does not have concerns regarding medicines.  The following changes have been made:  no change  Labs/ tests ordered today include:   Orders Placed This Encounter  Procedures  . Lipid panel  . Hepatic function panel  . Basic metabolic panel  . CBC with Differential/Platelet  . Lower Extremity Arterial Duplex Bilateral     Plan: Return in 4 months for office visit EKG  CBC lipid panel hepatic function panel and basal metabolic panel   Signed, Darlin Coco, MD  05/30/2014 10:02 AM    [address], [place], [place]  [address] Phone: [phone]; Fax: [phone]

## 2014-05-30 NOTE — Patient Instructions (Addendum)
Medication Instructions:  Your physician recommends that you continue on your current medications as directed. Please refer to the Current Medication list given to you today.  Labwork: none  Testing/Procedures: Your physician has requested that you have a lower or upper extremity arterial duplex. This test is an ultrasound of the arteries in the legs or arms. It looks at arterial blood flow in the legs and arms. Allow one hour for Lower and Upper Arterial scans. There are no restrictions or special instructions   Follow-Up: Your physician recommends that you schedule a follow-up appointment in: 4 months with fasting labs (lp/bmet/hfp/cbc) and ekg

## 2014-05-31 ENCOUNTER — Other Ambulatory Visit (HOSPITAL_COMMUNITY): Payer: Self-pay | Admitting: Cardiology

## 2014-05-31 DIAGNOSIS — R0989 Other specified symptoms and signs involving the circulatory and respiratory systems: Secondary | ICD-10-CM

## 2014-06-02 ENCOUNTER — Ambulatory Visit: Payer: Medicare Other | Admitting: Internal Medicine

## 2014-06-04 ENCOUNTER — Other Ambulatory Visit: Payer: Self-pay | Admitting: Cardiology

## 2014-06-05 ENCOUNTER — Ambulatory Visit (HOSPITAL_COMMUNITY): Payer: Medicare Other | Attending: Cardiology | Admitting: Cardiology

## 2014-06-05 DIAGNOSIS — M79672 Pain in left foot: Secondary | ICD-10-CM | POA: Insufficient documentation

## 2014-06-05 DIAGNOSIS — R208 Other disturbances of skin sensation: Secondary | ICD-10-CM

## 2014-06-05 DIAGNOSIS — I10 Essential (primary) hypertension: Secondary | ICD-10-CM | POA: Insufficient documentation

## 2014-06-05 DIAGNOSIS — I2510 Atherosclerotic heart disease of native coronary artery without angina pectoris: Secondary | ICD-10-CM | POA: Insufficient documentation

## 2014-06-05 DIAGNOSIS — R0989 Other specified symptoms and signs involving the circulatory and respiratory systems: Secondary | ICD-10-CM | POA: Insufficient documentation

## 2014-06-05 DIAGNOSIS — E785 Hyperlipidemia, unspecified: Secondary | ICD-10-CM | POA: Diagnosis not present

## 2014-06-05 DIAGNOSIS — I4891 Unspecified atrial fibrillation: Secondary | ICD-10-CM | POA: Insufficient documentation

## 2014-06-05 NOTE — Progress Notes (Signed)
Lower arterial Doppler performed  

## 2014-06-27 ENCOUNTER — Other Ambulatory Visit: Payer: Self-pay | Admitting: Cardiology

## 2014-08-23 ENCOUNTER — Telehealth: Payer: Self-pay | Admitting: Cardiology

## 2014-08-23 NOTE — Telephone Encounter (Signed)
Pt called in stating that he is on vacation in [place], [place] with his wife and since Monday has experienced "waves of low grade pain down left arm".  Low grade pain at level 2-3.  Pt has had GI upset, he stated it is acid reflux that he is treating with anti-acids.  Pt stated that he has been taking them a lot more than normal but they eventually help. He does not have any stomach pain, nausea, vomiting, or diarrhea. Pt stated the he does have some SOB on exertion but it gets better once he sits down. Pt stated he no longer uses his inhaler and does not have it with him. Pt has experienced decreased energy.  Pt stated he does have tightness under left arm but not across the chest, no c/o chest pain or palpitations.  Pt has not check BP or weight as he left his monitor and scale at home.  Pt stated that he did check his BP before leaving on Monday and it was 145-150 over something and that was high for him.  Pt stated that his diet has been different as he is not home but that he does not feel like he has gained any weight.  Pt stated from time to time he gets clammy but it could be the environment. Pt has not missed any doses of his Xarelto. Pt called in as he knows he has an appointment with Dr. Mare Ferrari in August and wants to know if he can be seen sooner.   Pt advised to go to the ED to be evaluated as all of these are a new combination of symptoms for him.  He stated he is not going to the ED there and if he decides to go that he will go when he gets back home, as he and his wife are leaving today.  Pt advised it is best to be evaluated and though these symptoms seem mild to him no need to wait until they get worse.  Pt stated he will just wait to hear back from Dr. Mare Ferrari or [name] to see if can get a sooner appointment.  Pt advised again he needs to be evaluated but if he decides not to go immediately he does need to call 911 or go to the emergency room if symptoms get worse.  Pt agrees no  additional questions at this time.

## 2014-08-23 NOTE — Telephone Encounter (Signed)
Pt c/o Shortness Of Breath: STAT if SOB developed within the last 24 hours or pt is noticeably SOB on the phone  1. Are you currently SOB (can you hear that pt is SOB on the phone)? No  2. How long have you been experiencing SOB? Monday  3. Are you SOB when sitting or when up moving around? Sitting  4. Are you currently experiencing any other symptoms? Pain in left arm

## 2014-08-24 ENCOUNTER — Ambulatory Visit (INDEPENDENT_AMBULATORY_CARE_PROVIDER_SITE_OTHER): Payer: Medicare Other | Admitting: Cardiology

## 2014-08-24 ENCOUNTER — Ambulatory Visit
Admission: RE | Admit: 2014-08-24 | Discharge: 2014-08-24 | Disposition: A | Discharge status: Home / Self Care | Payer: Medicare Other | Source: Ambulatory Visit | Attending: Cardiology | Admitting: Cardiology

## 2014-08-24 ENCOUNTER — Telehealth: Payer: Self-pay | Admitting: Cardiology

## 2014-08-24 ENCOUNTER — Encounter: Payer: Self-pay | Admitting: Cardiology

## 2014-08-24 VITALS — BP 150/90 | HR 70 | Ht 72.0 in | Wt 219.0 lb

## 2014-08-24 DIAGNOSIS — R5383 Other fatigue: Secondary | ICD-10-CM

## 2014-08-24 DIAGNOSIS — J209 Acute bronchitis, unspecified: Secondary | ICD-10-CM

## 2014-08-24 DIAGNOSIS — I10 Essential (primary) hypertension: Secondary | ICD-10-CM | POA: Diagnosis not present

## 2014-08-24 DIAGNOSIS — R05 Cough: Secondary | ICD-10-CM | POA: Diagnosis not present

## 2014-08-24 DIAGNOSIS — R0602 Shortness of breath: Secondary | ICD-10-CM

## 2014-08-24 DIAGNOSIS — R202 Paresthesia of skin: Secondary | ICD-10-CM

## 2014-08-24 DIAGNOSIS — R5381 Other malaise: Secondary | ICD-10-CM | POA: Diagnosis not present

## 2014-08-24 DIAGNOSIS — R059 Cough, unspecified: Secondary | ICD-10-CM

## 2014-08-24 DIAGNOSIS — R200 Anesthesia of skin: Secondary | ICD-10-CM

## 2014-08-24 DIAGNOSIS — R053 Chronic cough: Secondary | ICD-10-CM | POA: Insufficient documentation

## 2014-08-24 LAB — CBC WITH DIFFERENTIAL/PLATELET
BASOS ABS: 0 10*3/uL (ref 0.0–0.1)
BASOS PCT: 0.4 % (ref 0.0–3.0)
Eosinophils Absolute: 0.1 10*3/uL (ref 0.0–0.7)
Eosinophils Relative: 1.2 % (ref 0.0–5.0)
HCT: 43.1 % (ref 39.0–52.0)
HEMOGLOBIN: 14.2 g/dL (ref 13.0–17.0)
LYMPHS PCT: 17.8 % (ref 12.0–46.0)
Lymphs Abs: 1.7 10*3/uL (ref 0.7–4.0)
MCHC: 33 g/dL (ref 30.0–36.0)
MCV: 92.1 fl (ref 78.0–100.0)
Monocytes Absolute: 0.8 10*3/uL (ref 0.1–1.0)
Monocytes Relative: 8.3 % (ref 3.0–12.0)
NEUTROS ABS: 6.9 10*3/uL (ref 1.4–7.7)
Neutrophils Relative %: 72.3 % (ref 43.0–77.0)
PLATELETS: 157 10*3/uL (ref 150.0–400.0)
RBC: 4.68 Mil/uL (ref 4.22–5.81)
RDW: 14.5 % (ref 11.5–15.5)
WBC: 9.5 10*3/uL (ref 4.0–10.5)

## 2014-08-24 LAB — BASIC METABOLIC PANEL
BUN: 17 mg/dL (ref 6–23)
CO2: 29 mEq/L (ref 19–32)
Calcium: 9.6 mg/dL (ref 8.4–10.5)
Chloride: 101 mEq/L (ref 96–112)
Creatinine, Ser: 1.14 mg/dL (ref 0.40–1.50)
GFR: 66.04 mL/min (ref >60.00)
Glucose, Bld: 143 mg/dL — ABNORMAL HIGH (ref 70–99)
POTASSIUM: 4.3 meq/L (ref 3.5–5.1)
SODIUM: 136 meq/L (ref 135–145)

## 2014-08-24 LAB — T4, FREE: Free T4: 0.88 ng/dL (ref 0.60–1.60)

## 2014-08-24 LAB — TSH: TSH: 1.43 u[IU]/mL (ref 0.35–4.50)

## 2014-08-24 MED ORDER — AMLODIPINE BESYLATE 5 MG PO TABS: 5.0000 mg | ORAL_TABLET | Freq: Every day | ORAL | Status: DC

## 2014-08-24 MED ORDER — AZITHROMYCIN 250 MG PO TABS: ORAL_TABLET | ORAL | Status: DC

## 2014-08-24 NOTE — Telephone Encounter (Signed)
Please see his note.  For now he could double up on his omeprazole to twice a day and see if that helps his reflux.

## 2014-08-24 NOTE — Telephone Encounter (Signed)
Patient being seen by  Dr. Mare Ferrari today

## 2014-08-24 NOTE — Patient Instructions (Signed)
Medication Instructions:  START ZPAK AS DIRECTED  START MUCINEX PLAIN TWICE DAILY AS NEEDED  START AMLODIPINE 5 MG ONCE DAILY   Labwork: CBC/BMET/TSH/FT4  Testing/Procedures: Your physician has requested that you have a lexiscan myoview. For further information please visit [email or web address]. Please follow instruction sheet, as given. WALKING LEXISCAN   Follow-Up: [place]

## 2014-08-24 NOTE — Progress Notes (Signed)
Cardiology Office Note   Date:  08/24/2014   ID:  Theodore [name], DOB 1934/09/03, MRN [id number]  PCP:  Warren Danes, MD  Cardiologist: Darlin Coco MD  Chief Complaint  Patient presents with  . chest pressure      History of Present Illness: Theodore [name] is a 80 y.o. male who presents for work in office visit  This pleasant 80 year old gentleman is seen for a work in office visit. He has a past history of paroxysmal atrial fibrillation and a history of essential hypertension and dyslipidemia. He does not have any history of ischemic heart disease. He does have a history of asthma and is followed by Dr. Annamaria Boots. Dr. Annamaria Boots recently did extensive pulmonary function studies and subsequently switched him to a different steroid-based inhaler. He feels that the new inhaler has helped his exertional dyspnea. He has had a lot of orthopedic issues and problems with low back pain but that has recently been more stable. In April 2014 he was found to be in paroxysmal atrial fibrillation. Since then he has been on anticoagulation. He was initially on Pradaxa and subsequently switched to Xarelto. He has a past history of thrombocytopenia and has seen Dr. Learta Codding. No treatment indicated. His last platelet count was more than 140,000. This was in the summer of 2015. He comes in today concerning several problems.  He began to feel bad 3 days ago.  He was at the beach.  He started with lethargy.  He had no energy.  He noted some increased shortness of breath.  He then began to have severe coughing and has been bringing up some yellow phlegm.  He has not had any precordial chest pressure but has had some numbness and tingling in his neck and left arm and shoulder.  He has had some left lateral chest pain.  He has not had any pleuritic pain.  His blood pressure has been running higher.   Past Medical History  Diagnosis Date  . Hypertension   . Hypercholesterolemia   . SOB (shortness of breath)    . Peripheral edema   . Nocturia   . Right bundle branch block   . PAF (paroxysmal atrial fibrillation) January 2013    Placed on Pradaxa. Did not require cardioversion; spontaneously converted  . Tendonitis of elbow, left     Past Surgical History  Procedure Laterality Date  . Cardiac catheterization  2009    NONOBSTRUCTIVE ATHERSCLEROTIC CORONARY DISEASE AND NORMAL  LV FUNCTION  . Back surgery  12/24/09     Current Outpatient Prescriptions  Medication Sig Dispense Refill  . albuterol (PROVENTIL HFA;VENTOLIN HFA) 108 (90 BASE) MCG/ACT inhaler Inhale 2 puffs into the lungs every 6 (six) hours as needed for wheezing or shortness of breath. 1 Inhaler prn  . Ascorbic Acid (VITAMIN C PO) Take 1 tablet by mouth daily.     [name] atorvastatin (LIPITOR) 40 MG tablet TAKE 1 TABLET DAILY 90 tablet 1  . CARTIA XT 180 MG 24 hr capsule TAKE 1 CAPSULE DAILY 90 capsule 2  . furosemide (LASIX) 20 MG tablet Take 1 tablet (20 mg total) by mouth daily. 90 tablet 3  . guaiFENesin (MUCINEX) 600 MG 12 hr tablet Take 600 mg by mouth 2 (two) times daily as needed for cough or to loosen phlegm.    [name] losartan (COZAAR) 100 MG tablet TAKE 1 TABLET DAILY 90 tablet 2  . LYSINE PO Take 1 tablet by mouth daily.     [name]  metoprolol tartrate (LOPRESSOR) 25 MG tablet TAKE 2 TABLETS IN THE MORNING AND 1 TABLET IN THE EVENING 270 tablet 1  . Naproxen Sodium (ALEVE PO) Take 1 capsule by mouth 2 (two) times daily.     [name] omeprazole (PRILOSEC) 20 MG capsule Take 20 mg by mouth daily.      . potassium chloride (K-DUR) 10 MEQ tablet Take 1 tablet (10 mEq total) by mouth 2 (two) times daily. 180 tablet 3  . XARELTO 20 MG TABS tablet TAKE 1 TABLET DAILY WITH SUPPER 90 tablet 2  . amLODipine (NORVASC) 5 MG tablet Take 1 tablet (5 mg total) by mouth daily. 30 tablet 5  . azithromycin (ZITHROMAX) 250 MG tablet 2 TABLETS BY MOUTH ON DAY ONE AND THEN ONE DAILY UNTIL FINISHED 6 each 0   No current facility-administered medications for  this visit.    Allergies:   Ace inhibitors; Lopid; Niacin and related; Penicillins; Tamiflu; and Zocor    Social History:  The patient  reports that he has never smoked. He does not have any smokeless tobacco history on file. He reports that he does not drink alcohol or use illicit drugs.   Family History:  The patient's family history includes Asthma in his sister; COPD in his brother and father; Heart attack in his father; Heart disease in his brother and brother; Hypertension in his father. There is no history of Stroke.    ROS:  Please see the history of present illness.   Otherwise, review of systems are positive for none.   All other systems are reviewed and negative.    PHYSICAL EXAM: VS:  BP 150/90 mmHg  Pulse 70  Ht 6' (1.829 m)  Wt 219 lb (99.338 kg)  BMI 29.70 kg/m2 , BMI Body mass index is 29.7 kg/(m^2). GEN: Well nourished, well developed, in no acute distress HEENT: normal Neck: no JVD, carotid bruits, or masses Cardiac: RRR; no murmurs, rubs, or gallops,no edema  Respiratory:  clear to auscultation bilaterally, normal work of breathing GI: soft, nontender, nondistended, + BS MS: no deformity or atrophy Skin: warm and dry, no rash Neuro:  Strength and sensation are intact Psych: euthymic mood, full affect   EKG:  EKG is ordered today. The ekg ordered today demonstrates normal sinus rhythm at 70 per minute.  Occasional PVCs.  Right bundle branch block.  Since prior tracing of 01/18/14, PVCs are present.   Recent Labs: 05/23/2014: ALT 21 08/24/2014: BUN 17; Creatinine, Ser 1.14; Hemoglobin 14.2; Platelets 157.0; Potassium 4.3; Sodium 136; TSH 1.43    Lipid Panel    Component Value Date/Time   CHOL 148 05/23/2014 0741   TRIG 85.0 05/23/2014 0741   HDL 53.80 05/23/2014 0741   CHOLHDL 3 05/23/2014 0741   VLDL 17.0 05/23/2014 0741   LDLCALC 77 05/23/2014 0741      Wt Readings from Last 3 Encounters:  08/24/14 219 lb (99.338 kg)  05/30/14 218 lb 1.9 oz  (98.939 kg)  05/26/14 219 lb 12.8 oz (99.701 kg)        ASSESSMENT AND PLAN:  1. paroxysmal atrial fibrillation. Presently sinus bradycardia. 2. hypertensive heart disease without heart failure.  Blood pressure is higher today 3. frequent PVCs, quiescent 4. right bundle branch block 5. Hypercholesterolemia 6. right upper quadrant pain resolved 7. history of thrombocytopenia 8. Osteoarthritis 9.  Left arm numbness and tingling with atypical left lateral chest pain. 10.  Cough productive of white sputum.  Chest x-ray today unremarkable. 11.  Malaise and fatigue  Disposition: We will check baseline labs today including CBC, basal metabolic panel, TSH free T4.  For his left arm discomfort and atypical left lateral chest pain we will get a walking Lexiscan Myoview.  For his systolic hypertension we will add amlodipine 5 mg daily.  For his cough we will give him Zithromax and Mucinex. He will keep his August appointment.  He will try to get extra rest the next several days. Current medicines are reviewed at length with the patient today.    The patient does not have concerns regarding medicines.  The following changes have been made:  Add amlodipine 5 mg daily  Labs/ tests ordered today include:   Orders Placed This Encounter  Procedures  . TSH  . Basic metabolic panel  . CBC with Differential/Platelet  . T4, free  . Myocardial Perfusion Imaging  . EKG 12-Lead       Signed, Darlin Coco MD 08/24/2014 5:24 PM    [facility] [address], [place], [place]  [address] Phone: [phone]; Fax: [phone]

## 2014-08-24 NOTE — Telephone Encounter (Signed)
New message   Pt called states that her has mild left arm pain, a cough and tiredness that has been going on for a few days 2-4 days Doesnt want to go to the ER.

## 2014-08-24 NOTE — Telephone Encounter (Signed)
Spoke with  Dr. Mare Ferrari and will have patient go for chest xray and then come to office for appointment at 1:30 Advised patient

## 2014-08-29 ENCOUNTER — Telehealth (HOSPITAL_COMMUNITY): Payer: Self-pay | Admitting: *Deleted

## 2014-08-29 NOTE — Telephone Encounter (Signed)
Patient given detailed instructions per Myocardial Perfusion Study Information Sheet for test on 08/31/14 at 0915. Patient Notified to arrive 15 minutes early, and that it is imperative to arrive on time for appointment to keep from having the test rescheduled. Patient verbalized understanding. [name], Ranae Palms

## 2014-08-30 ENCOUNTER — Encounter: Payer: Self-pay | Admitting: Cardiology

## 2014-08-31 ENCOUNTER — Ambulatory Visit (HOSPITAL_COMMUNITY): Payer: Medicare Other | Attending: Internal Medicine

## 2014-08-31 DIAGNOSIS — R9439 Abnormal result of other cardiovascular function study: Secondary | ICD-10-CM | POA: Insufficient documentation

## 2014-08-31 DIAGNOSIS — I2510 Atherosclerotic heart disease of native coronary artery without angina pectoris: Secondary | ICD-10-CM

## 2014-08-31 DIAGNOSIS — R202 Paresthesia of skin: Secondary | ICD-10-CM

## 2014-08-31 DIAGNOSIS — R200 Anesthesia of skin: Secondary | ICD-10-CM

## 2014-08-31 DIAGNOSIS — I10 Essential (primary) hypertension: Secondary | ICD-10-CM | POA: Insufficient documentation

## 2014-08-31 DIAGNOSIS — R5383 Other fatigue: Secondary | ICD-10-CM

## 2014-08-31 DIAGNOSIS — R5381 Other malaise: Secondary | ICD-10-CM

## 2014-08-31 LAB — MYOCARDIAL PERFUSION IMAGING
CHL CUP RESTING HR STRESS: 55 {beats}/min
LV dias vol: 163 mL
LVSYSVOL: 83 mL
NUC STRESS TID: 0.94
Peak HR: 79 {beats}/min
RATE: 0.33
SDS: 4
SRS: 6
SSS: 10

## 2014-08-31 MED ORDER — TECHNETIUM TC 99M SESTAMIBI GENERIC - CARDIOLITE
30.6000 | Freq: Once | INTRAVENOUS | Status: AC | PRN
  Administered 2014-08-31: 31 via INTRAVENOUS

## 2014-08-31 MED ORDER — REGADENOSON 0.4 MG/5ML IV SOLN
0.4000 mg | Freq: Once | INTRAVENOUS | Status: AC
  Administered 2014-08-31: 0.4 mg via INTRAVENOUS

## 2014-08-31 MED ORDER — TECHNETIUM TC 99M SESTAMIBI GENERIC - CARDIOLITE
11.0000 | Freq: Once | INTRAVENOUS | Status: AC | PRN
  Administered 2014-08-31: 11 via INTRAVENOUS

## 2014-09-06 ENCOUNTER — Ambulatory Visit (INDEPENDENT_AMBULATORY_CARE_PROVIDER_SITE_OTHER): Payer: Medicare Other | Admitting: Cardiology

## 2014-09-06 ENCOUNTER — Encounter: Payer: Self-pay | Admitting: Cardiology

## 2014-09-06 VITALS — BP 110/80 | HR 89 | Ht 72.0 in | Wt 221.4 lb

## 2014-09-06 DIAGNOSIS — I10 Essential (primary) hypertension: Secondary | ICD-10-CM

## 2014-09-06 DIAGNOSIS — R5381 Other malaise: Secondary | ICD-10-CM | POA: Diagnosis not present

## 2014-09-06 DIAGNOSIS — R931 Abnormal findings on diagnostic imaging of heart and coronary circulation: Secondary | ICD-10-CM | POA: Diagnosis not present

## 2014-09-06 DIAGNOSIS — R0602 Shortness of breath: Secondary | ICD-10-CM | POA: Diagnosis not present

## 2014-09-06 DIAGNOSIS — I480 Paroxysmal atrial fibrillation: Secondary | ICD-10-CM | POA: Diagnosis not present

## 2014-09-06 DIAGNOSIS — R5383 Other fatigue: Secondary | ICD-10-CM

## 2014-09-06 DIAGNOSIS — R9439 Abnormal result of other cardiovascular function study: Secondary | ICD-10-CM | POA: Insufficient documentation

## 2014-09-06 LAB — CBC WITH DIFFERENTIAL/PLATELET
Basophils Absolute: 0 10*3/uL (ref 0.0–0.1)
Basophils Relative: 0.5 % (ref 0.0–3.0)
Eosinophils Absolute: 0.1 10*3/uL (ref 0.0–0.7)
Eosinophils Relative: 0.8 % (ref 0.0–5.0)
HCT: 42.8 % (ref 39.0–52.0)
Hemoglobin: 14.4 g/dL (ref 13.0–17.0)
Lymphocytes Relative: 30.6 % (ref 12.0–46.0)
Lymphs Abs: 2.4 10*3/uL (ref 0.7–4.0)
MCHC: 33.8 g/dL (ref 30.0–36.0)
MCV: 91.2 fl (ref 78.0–100.0)
MONO ABS: 0.6 10*3/uL (ref 0.1–1.0)
Monocytes Relative: 7.6 % (ref 3.0–12.0)
Neutro Abs: 4.8 10*3/uL (ref 1.4–7.7)
Neutrophils Relative %: 60.5 % (ref 43.0–77.0)
PLATELETS: 182 10*3/uL (ref 150.0–400.0)
RBC: 4.69 Mil/uL (ref 4.22–5.81)
RDW: 14.8 % (ref 11.5–15.5)
WBC: 7.9 10*3/uL (ref 4.0–10.5)

## 2014-09-06 LAB — BASIC METABOLIC PANEL
BUN: 21 mg/dL (ref 6–23)
CO2: 28 meq/L (ref 19–32)
CREATININE: 1.04 mg/dL (ref 0.40–1.50)
Calcium: 9.5 mg/dL (ref 8.4–10.5)
Chloride: 100 mEq/L (ref 96–112)
GFR: 73.41 mL/min (ref >60.00)
Glucose, Bld: 109 mg/dL — ABNORMAL HIGH (ref 70–99)
POTASSIUM: 4.7 meq/L (ref 3.5–5.1)
SODIUM: 134 meq/L — AB (ref 135–145)

## 2014-09-06 LAB — APTT: APTT: 34.6 s — AB (ref 23.4–32.7)

## 2014-09-06 LAB — PROTIME-INR
INR: 1.4 ratio — AB (ref 0.8–1.0)
Prothrombin Time: 15.9 s — ABNORMAL HIGH (ref 9.6–13.1)

## 2014-09-06 NOTE — Patient Instructions (Addendum)
Will obtain labs today and call you with the results (BMET/PT/PTT/CBC)  You are scheduled for a cardiac catheterization on 09/08/14 with Dr. Tamala Julian  Go to [facility] on 09/08/14 at 11:30 am.  Enter thru the [facility] entrance A No food or drink after midnight the night before You may take all your medications EXCEPT FOR LOSARTAN AND LASIX (FUROSEMIDE) with a sip of water on the day of your procedure.   DO NOT TAKE YOUR XARELTO UNTIL AFTER THE PROCEDURE  START ASPIRIN 39 MG TODAY   Will call you with follow up appointment date and time

## 2014-09-06 NOTE — Progress Notes (Signed)
Cardiology Office Note   Date:  09/06/2014   ID:  Theodore [name], DOB 13-Dec-1934, MRN [id number]  PCP:  Warren Danes, MD  Cardiologist: Darlin Coco MD  No chief complaint on file.     History of Present Illness: Theodore [name] is a 80 y.o. male who presents for a work in office visit. This pleasant 80 year old gentleman is seen for a work in office visit. He has a past history of paroxysmal atrial fibrillation and a history of essential hypertension and dyslipidemia. He does not have any prior history of ischemic heart disease. He does have a history of asthma and is followed by Dr. Annamaria Boots. Dr. Annamaria Boots recently did extensive pulmonary function studies and subsequently switched him to a different steroid-based inhaler. He feels that the new inhaler has helped his exertional dyspnea. He has had a lot of orthopedic issues and problems with low back pain but that has recently been more stable. In April 2014 he was found to be in paroxysmal atrial fibrillation. Since then he has been on anticoagulation. He was initially on Pradaxa and subsequently switched to Xarelto. He has a past history of thrombocytopenia and has seen Dr. Learta Codding. No treatment indicated. His last platelet count was more than 140,000. This was in the summer of 2015. Several weeks ago the patient was at the beach.  He suddenly felt very bad.  He had some left chest and left arm discomfort.  He also developed a cough.  Because of his new symptoms he came back from the beach early.  He was seen in the office on 08/24/14.  He had a chest x-ray showing COPD and normal heart size and no CHF or pneumonia.  He had a Lexi scan mild perfusion stress test on 08/31/14 which showed an old inferior wall scar with peri-infarct ischemia and his ejection fraction was low at 49%.  The patient had had a prior perfusion scan several years ago which had not shown any perfusion deficit.  He comes in now to discuss cardiac catheterization.  He  has continued to have unexplained fatigue although he has not had any further chest discomfort.   Past Medical History  Diagnosis Date  . Hypertension   . Hypercholesterolemia   . SOB (shortness of breath)   . Peripheral edema   . Nocturia   . Right bundle branch block   . PAF (paroxysmal atrial fibrillation) January 2013    Placed on Pradaxa. Did not require cardioversion; spontaneously converted  . Tendonitis of elbow, left     Past Surgical History  Procedure Laterality Date  . Cardiac catheterization  2009    NONOBSTRUCTIVE ATHERSCLEROTIC CORONARY DISEASE AND NORMAL  LV FUNCTION  . Back surgery  12/24/09     Current Outpatient Prescriptions  Medication Sig Dispense Refill  . albuterol (PROVENTIL HFA;VENTOLIN HFA) 108 (90 BASE) MCG/ACT inhaler Inhale 2 puffs into the lungs every 6 (six) hours as needed for wheezing or shortness of breath. 1 Inhaler prn  . amLODipine (NORVASC) 5 MG tablet Take 1 tablet (5 mg total) by mouth daily. 30 tablet 5  . Ascorbic Acid (VITAMIN C PO) Take 1 tablet by mouth daily.     [name] atorvastatin (LIPITOR) 40 MG tablet TAKE 1 TABLET DAILY 90 tablet 1  . CARTIA XT 180 MG 24 hr capsule TAKE 1 CAPSULE DAILY 90 capsule 2  . furosemide (LASIX) 20 MG tablet Take 1 tablet (20 mg total) by mouth daily. 90 tablet 3  . guaiFENesin (  MUCINEX) 600 MG 12 hr tablet Take 600 mg by mouth 2 (two) times daily as needed for cough or to loosen phlegm.    [name] losartan (COZAAR) 100 MG tablet TAKE 1 TABLET DAILY 90 tablet 2  . LYSINE PO Take 1 tablet by mouth daily.     . metoprolol tartrate (LOPRESSOR) 25 MG tablet TAKE 2 TABLETS IN THE MORNING AND 1 TABLET IN THE EVENING 270 tablet 1  . Naproxen Sodium (ALEVE PO) Take 1 capsule by mouth 2 (two) times daily.     [name] omeprazole (PRILOSEC) 20 MG capsule Take 20 mg by mouth daily.      . potassium chloride (K-DUR) 10 MEQ tablet Take 1 tablet (10 mEq total) by mouth 2 (two) times daily. 180 tablet 3  . XARELTO 20 MG TABS tablet  TAKE 1 TABLET DAILY WITH SUPPER 90 tablet 2   No current facility-administered medications for this visit.    Allergies:   Ace inhibitors; Lopid; Niacin and related; Zocor; Penicillins; and Tamiflu    Social History:  The patient  reports that he has never smoked. He does not have any smokeless tobacco history on file. He reports that he does not drink alcohol or use illicit drugs.   Family History:  The patient's family history includes Asthma in his sister; COPD in his brother and father; Heart attack in his father; Heart disease in his brother and brother; Hypertension in his father. There is no history of Stroke.    ROS:  Please see the history of present illness.   Otherwise, review of systems are positive for none.   All other systems are reviewed and negative.    PHYSICAL EXAM: VS:  BP 110/80 mmHg  Pulse 89  Ht 6' (1.829 m)  Wt 221 lb 6.4 oz (100.426 kg)  BMI 30.02 kg/m2 , BMI Body mass index is 30.02 kg/(m^2). GEN: Well nourished, well developed, in no acute distress HEENT: normal Neck: no JVD, carotid bruits, or masses Cardiac: RRR; no murmurs, rubs, or gallops,no edema  Respiratory:  clear to auscultation bilaterally, normal work of breathing GI: soft, nontender, nondistended, + BS MS: no deformity or atrophy Skin: warm and dry, no rash Neuro:  Strength and sensation are intact Psych: euthymic mood, full affect   EKG:  EKG is not ordered today.    Recent Labs: 05/23/2014: ALT 21 08/24/2014: TSH 1.43 09/06/2014: BUN 21; Creatinine, Ser 1.04; Hemoglobin 14.4; Platelets 182.0; Potassium 4.7; Sodium 134*    Lipid Panel    Component Value Date/Time   CHOL 148 05/23/2014 0741   TRIG 85.0 05/23/2014 0741   HDL 53.80 05/23/2014 0741   CHOLHDL 3 05/23/2014 0741   VLDL 17.0 05/23/2014 0741   LDLCALC 77 05/23/2014 0741      Wt Readings from Last 3 Encounters:  09/06/14 221 lb 6.4 oz (100.426 kg)  08/31/14 219 lb (99.338 kg)  08/24/14 219 lb (99.338 kg)         ASSESSMENT AND PLAN:  1.  Abnormal Myoview stress test 1. paroxysmal atrial fibrillation. Presently sinus bradycardia. 2. hypertensive heart disease without heart failure.  3. frequent PVCs, quiescent 4. right bundle branch block 5. Hypercholesterolemia 6. right upper quadrant pain resolved 7. history of thrombocytopenia 8. Osteoarthritis 9. Left arm numbness and tingling with atypical left lateral chest pain. 10. Cough productive of white sputum. Chest x-ray today unremarkable. 11. Malaise and fatigue   Plan: We will arrange for left heart cardiac catheterization on Friday, July 29.  He  took his Xarelto last evening.  He will hold it today and tomorrow and Friday.  He will start taking a baby aspirin one daily. The risks and benefits of a cardiac catheterization including, but not limited to, death, stroke, MI, kidney damage and bleeding were discussed with the patient who indicates understanding and agrees to proceed.   Labs/ tests ordered today include:   Orders Placed This Encounter  Procedures  . Basic metabolic panel  . CBC with Differential/Platelet  . PTT  . INR/PT      Signed, Darlin Coco MD 09/06/2014 5:35 PM    [facility] [place], [place], [place]  [address] Phone: [phone]; Fax: [phone]

## 2014-09-08 ENCOUNTER — Ambulatory Visit (HOSPITAL_COMMUNITY)
Admission: RE | Admit: 2014-09-08 | Discharge: 2014-09-08 | Disposition: A | Discharge status: Home / Self Care | Payer: Medicare Other | Source: Ambulatory Visit | Attending: Interventional Cardiology | Admitting: Interventional Cardiology

## 2014-09-08 ENCOUNTER — Encounter (HOSPITAL_COMMUNITY)
Admission: RE | Disposition: A | Discharge status: Home / Self Care | Payer: Medicare Other | Source: Ambulatory Visit | Attending: Interventional Cardiology

## 2014-09-08 ENCOUNTER — Other Ambulatory Visit: Payer: Self-pay

## 2014-09-08 DIAGNOSIS — I119 Hypertensive heart disease without heart failure: Secondary | ICD-10-CM | POA: Diagnosis not present

## 2014-09-08 DIAGNOSIS — M79602 Pain in left arm: Secondary | ICD-10-CM | POA: Diagnosis not present

## 2014-09-08 DIAGNOSIS — I2510 Atherosclerotic heart disease of native coronary artery without angina pectoris: Secondary | ICD-10-CM | POA: Diagnosis not present

## 2014-09-08 DIAGNOSIS — R0602 Shortness of breath: Secondary | ICD-10-CM

## 2014-09-08 DIAGNOSIS — R9439 Abnormal result of other cardiovascular function study: Secondary | ICD-10-CM | POA: Diagnosis present

## 2014-09-08 DIAGNOSIS — I4891 Unspecified atrial fibrillation: Secondary | ICD-10-CM | POA: Diagnosis present

## 2014-09-08 DIAGNOSIS — I2584 Coronary atherosclerosis due to calcified coronary lesion: Secondary | ICD-10-CM | POA: Diagnosis not present

## 2014-09-08 DIAGNOSIS — Z79899 Other long term (current) drug therapy: Secondary | ICD-10-CM | POA: Insufficient documentation

## 2014-09-08 DIAGNOSIS — E785 Hyperlipidemia, unspecified: Secondary | ICD-10-CM | POA: Diagnosis not present

## 2014-09-08 DIAGNOSIS — R5381 Other malaise: Secondary | ICD-10-CM

## 2014-09-08 DIAGNOSIS — R0609 Other forms of dyspnea: Secondary | ICD-10-CM | POA: Diagnosis present

## 2014-09-08 DIAGNOSIS — R0600 Dyspnea, unspecified: Secondary | ICD-10-CM | POA: Diagnosis not present

## 2014-09-08 DIAGNOSIS — I4819 Other persistent atrial fibrillation: Secondary | ICD-10-CM | POA: Diagnosis present

## 2014-09-08 DIAGNOSIS — I4510 Unspecified right bundle-branch block: Secondary | ICD-10-CM | POA: Diagnosis not present

## 2014-09-08 DIAGNOSIS — Z791 Long term (current) use of non-steroidal anti-inflammatories (NSAID): Secondary | ICD-10-CM | POA: Insufficient documentation

## 2014-09-08 DIAGNOSIS — I10 Essential (primary) hypertension: Secondary | ICD-10-CM | POA: Diagnosis present

## 2014-09-08 DIAGNOSIS — M1990 Unspecified osteoarthritis, unspecified site: Secondary | ICD-10-CM | POA: Insufficient documentation

## 2014-09-08 DIAGNOSIS — I480 Paroxysmal atrial fibrillation: Secondary | ICD-10-CM | POA: Insufficient documentation

## 2014-09-08 DIAGNOSIS — R05 Cough: Secondary | ICD-10-CM | POA: Diagnosis not present

## 2014-09-08 DIAGNOSIS — Z7901 Long term (current) use of anticoagulants: Secondary | ICD-10-CM | POA: Diagnosis not present

## 2014-09-08 DIAGNOSIS — R5383 Other fatigue: Secondary | ICD-10-CM | POA: Diagnosis not present

## 2014-09-08 DIAGNOSIS — E780 Pure hypercholesterolemia: Secondary | ICD-10-CM | POA: Insufficient documentation

## 2014-09-08 HISTORY — PX: CARDIAC CATHETERIZATION: SHX172

## 2014-09-08 SURGERY — LEFT HEART CATH AND CORONARY ANGIOGRAPHY

## 2014-09-08 MED ORDER — VERAPAMIL HCL 2.5 MG/ML IV SOLN
INTRAVENOUS | Status: AC
  Filled 2014-09-08: qty 2

## 2014-09-08 MED ORDER — SODIUM CHLORIDE 0.9 % IV SOLN: 250.0000 mL | INTRAVENOUS | Status: DC | PRN

## 2014-09-08 MED ORDER — DILTIAZEM HCL ER COATED BEADS 180 MG PO CP24: 180.0000 mg | ORAL_CAPSULE | Freq: Every day | ORAL | Status: DC

## 2014-09-08 MED ORDER — ONDANSETRON HCL 4 MG/2ML IJ SOLN: 4.0000 mg | Freq: Four times a day (QID) | INTRAMUSCULAR | Status: DC | PRN

## 2014-09-08 MED ORDER — SODIUM CHLORIDE 0.9 % WEIGHT BASED INFUSION
3.0000 mL/kg/h | INTRAVENOUS | Status: AC
  Administered 2014-09-08: 3 mL/kg/h via INTRAVENOUS

## 2014-09-08 MED ORDER — NITROGLYCERIN 1 MG/10 ML FOR IR/CATH LAB
INTRA_ARTERIAL | Status: AC
  Filled 2014-09-08: qty 10

## 2014-09-08 MED ORDER — MIDAZOLAM HCL 2 MG/2ML IJ SOLN
INTRAMUSCULAR | Status: DC | PRN
  Administered 2014-09-08 (×2): 1 mg via INTRAVENOUS

## 2014-09-08 MED ORDER — LIDOCAINE HCL (PF) 1 % IJ SOLN
INTRAMUSCULAR | Status: DC | PRN
  Administered 2014-09-08: 5 mL via INTRADERMAL

## 2014-09-08 MED ORDER — SODIUM CHLORIDE 0.9 % IJ SOLN: 3.0000 mL | Freq: Two times a day (BID) | INTRAMUSCULAR | Status: DC

## 2014-09-08 MED ORDER — FENTANYL CITRATE (PF) 100 MCG/2ML IJ SOLN
INTRAMUSCULAR | Status: DC | PRN
  Administered 2014-09-08: 50 ug via INTRAVENOUS

## 2014-09-08 MED ORDER — SODIUM CHLORIDE 0.9 % WEIGHT BASED INFUSION: 1.0000 mL/kg/h | INTRAVENOUS | Status: DC

## 2014-09-08 MED ORDER — OXYCODONE-ACETAMINOPHEN 5-325 MG PO TABS: 1.0000 | ORAL_TABLET | ORAL | Status: DC | PRN

## 2014-09-08 MED ORDER — ASPIRIN 81 MG PO CHEW: 81.0000 mg | CHEWABLE_TABLET | ORAL | Status: DC

## 2014-09-08 MED ORDER — ACETAMINOPHEN 325 MG PO TABS: 650.0000 mg | ORAL_TABLET | ORAL | Status: DC | PRN

## 2014-09-08 MED ORDER — FENTANYL CITRATE (PF) 100 MCG/2ML IJ SOLN
INTRAMUSCULAR | Status: AC
  Filled 2014-09-08: qty 2

## 2014-09-08 MED ORDER — HEPARIN (PORCINE) IN NACL 2-0.9 UNIT/ML-% IJ SOLN
INTRAMUSCULAR | Status: AC
  Filled 2014-09-08: qty 1500

## 2014-09-08 MED ORDER — HEPARIN SODIUM (PORCINE) 1000 UNIT/ML IJ SOLN
INTRAMUSCULAR | Status: AC
  Filled 2014-09-08: qty 1

## 2014-09-08 MED ORDER — HEPARIN SODIUM (PORCINE) 1000 UNIT/ML IJ SOLN
INTRAMUSCULAR | Status: DC | PRN
  Administered 2014-09-08: 5000 [IU] via INTRAVENOUS

## 2014-09-08 MED ORDER — SODIUM CHLORIDE 0.9 % WEIGHT BASED INFUSION: 3.0000 mL/kg/h | INTRAVENOUS | Status: AC

## 2014-09-08 MED ORDER — SODIUM CHLORIDE 0.9 % IJ SOLN: 3.0000 mL | INTRAMUSCULAR | Status: DC | PRN

## 2014-09-08 MED ORDER — VERAPAMIL HCL 2.5 MG/ML IV SOLN
INTRAVENOUS | Status: DC | PRN
  Administered 2014-09-08: 13:00:00 via INTRA_ARTERIAL

## 2014-09-08 MED ORDER — NITROGLYCERIN 0.2 MG/ML ON CALL CATH LAB
INTRAVENOUS | Status: AC
  Filled 2014-09-08: qty 1

## 2014-09-08 MED ORDER — LIDOCAINE HCL (PF) 1 % IJ SOLN
INTRAMUSCULAR | Status: AC
  Filled 2014-09-08: qty 30

## 2014-09-08 MED ORDER — IOHEXOL 350 MG/ML SOLN
INTRAVENOUS | Status: DC | PRN
  Administered 2014-09-08: 90 mL via INTRA_ARTERIAL

## 2014-09-08 MED ORDER — MIDAZOLAM HCL 2 MG/2ML IJ SOLN
INTRAMUSCULAR | Status: AC
  Filled 2014-09-08: qty 2

## 2014-09-08 SURGICAL SUPPLY — 10 items
CATH INFINITI 5 FR JL3.5 (CATHETERS) ×3 IMPLANT
CATH INFINITI JR4 5F (CATHETERS) ×3 IMPLANT
DEVICE RAD COMP TR BAND LRG (VASCULAR PRODUCTS) ×3 IMPLANT
GLIDESHEATH SLEND A-KIT 6F 22G (SHEATH) ×6 IMPLANT
KIT HEART LEFT (KITS) ×3 IMPLANT
PACK CARDIAC CATHETERIZATION (CUSTOM PROCEDURE TRAY) ×3 IMPLANT
TRANSDUCER W/STOPCOCK (MISCELLANEOUS) ×3 IMPLANT
TUBING CIL FLEX 10 FLL-RA (TUBING) ×3 IMPLANT
WIRE HI TORQ VERSACORE-J 145CM (WIRE) ×3 IMPLANT
WIRE SAFE-T 1.5MM-J .035X260CM (WIRE) ×3 IMPLANT

## 2014-09-08 NOTE — Interval H&P Note (Signed)
Cath Lab Visit (complete for each Cath Lab visit)  Clinical Evaluation Leading to the Procedure:   ACS: No.  Non-ACS:    Anginal Classification: CCS III  Anti-ischemic medical therapy: Maximal Therapy (2 or more classes of medications)  Non-Invasive Test Results: Intermediate-risk stress test findings: cardiac mortality 1-3%/year  Prior CABG: No previous CABG      History and Physical Interval Note:  09/08/2014 12:49 PM  Theodore [name]  has presented today for surgery, with the diagnosis of abnormal mioview  The various methods of treatment have been discussed with the patient and family. After consideration of risks, benefits and other options for treatment, the patient has consented to  Procedure(s): Left Heart Cath and Coronary Angiography (N/A) as a surgical intervention .  The patient's history has been reviewed, patient examined, no change in status, stable for surgery.  I have reviewed the patient's chart and labs.  Questions were answered to the patient's satisfaction.     Sinclair Grooms

## 2014-09-08 NOTE — Progress Notes (Signed)
Received pt with 15cc air in TRB at 1410.  Removal of air began at 1430.  Almost immediately site started to bleed.  Pressure held and Theodore [name] from cath came to assess site.  Pressure held for approx 10 minutes and band reapplied with 15cc air.  Will continue to monitor.  Dr Tamala Julian notified

## 2014-09-08 NOTE — Discharge Instructions (Signed)
Radial Site Care °Refer to this sheet in the next few weeks. These instructions provide you with information on caring for yourself after your procedure. Your caregiver may also give you more specific instructions. Your treatment has been planned according to current medical practices, but problems sometimes occur. Call your caregiver if you have any problems or questions after your procedure. °HOME CARE INSTRUCTIONS °· You may shower the day after the procedure. Remove the bandage (dressing) and gently wash the site with plain soap and water. Gently pat the site dry. °· Do not apply powder or lotion to the site. °· Do not submerge the affected site in water for 3 to 5 days. °· Inspect the site at least twice daily. °· Do not flex or bend the affected arm for 24 hours. °· No lifting over 5 pounds (2.3 kg) for 5 days after your procedure. °· Do not drive home if you are discharged the same day of the procedure. Have someone else drive you. °· You may drive 24 hours after the procedure unless otherwise instructed by your caregiver. °· Do not operate machinery or power tools for 24 hours. °· A responsible adult should be with you for the first 24 hours after you arrive home. °What to expect: °· Any bruising will usually fade within 1 to 2 weeks. °· Blood that collects in the tissue (hematoma) may be painful to the touch. It should usually decrease in size and tenderness within 1 to 2 weeks. °SEEK IMMEDIATE MEDICAL CARE IF: °· You have unusual pain at the radial site. °· You have redness, warmth, swelling, or pain at the radial site. °· You have drainage (other than a small amount of blood on the dressing). °· You have chills. °· You have a fever or persistent symptoms for more than 72 hours. °· You have a fever and your symptoms suddenly get worse. °· Your arm becomes pale, cool, tingly, or numb. °· You have heavy bleeding from the site. Hold pressure on the site. °Document Released: 03/01/2010 Document Revised:  04/21/2011 Document Reviewed: 03/01/2010 °ExitCare® Patient Information ©2015 [employer]. This information is not intended to replace advice given to you by your health care provider. Make sure you discuss any questions you have with your health care provider. ° °

## 2014-09-08 NOTE — H&P (View-Only) (Signed)
Cardiology Office Note   Date:  09/06/2014   ID:  YANG RACK, DOB Nov 12, 1934, MRN [id number]  PCP:  Warren Danes, MD  Cardiologist: Darlin Coco MD  No chief complaint on file.     History of Present Illness: Theodore [name] is a 80 y.o. male who presents for a work in office visit. This pleasant 80 year old gentleman is seen for a work in office visit. He has a past history of paroxysmal atrial fibrillation and a history of essential hypertension and dyslipidemia. He does not have any prior history of ischemic heart disease. He does have a history of asthma and is followed by Dr. Annamaria Boots. Dr. Annamaria Boots recently did extensive pulmonary function studies and subsequently switched him to a different steroid-based inhaler. He feels that the new inhaler has helped his exertional dyspnea. He has had a lot of orthopedic issues and problems with low back pain but that has recently been more stable. In April 2014 he was found to be in paroxysmal atrial fibrillation. Since then he has been on anticoagulation. He was initially on Pradaxa and subsequently switched to Xarelto. He has a past history of thrombocytopenia and has seen Dr. Learta Codding. No treatment indicated. His last platelet count was more than 140,000. This was in the summer of 2015. Several weeks ago the patient was at the beach.  He suddenly felt very bad.  He had some left chest and left arm discomfort.  He also developed a cough.  Because of his new symptoms he came back from the beach early.  He was seen in the office on 08/24/14.  He had a chest x-ray showing COPD and normal heart size and no CHF or pneumonia.  He had a Lexi scan mild perfusion stress test on 08/31/14 which showed an old inferior wall scar with peri-infarct ischemia and his ejection fraction was low at 49%.  The patient had had a prior perfusion scan several years ago which had not shown any perfusion deficit.  He comes in now to discuss cardiac catheterization.  He  has continued to have unexplained fatigue although he has not had any further chest discomfort.   Past Medical History  Diagnosis Date  . Hypertension   . Hypercholesterolemia   . SOB (shortness of breath)   . Peripheral edema   . Nocturia   . Right bundle branch block   . PAF (paroxysmal atrial fibrillation) January 2013    Placed on Pradaxa. Did not require cardioversion; spontaneously converted  . Tendonitis of elbow, left     Past Surgical History  Procedure Laterality Date  . Cardiac catheterization  2009    NONOBSTRUCTIVE ATHERSCLEROTIC CORONARY DISEASE AND NORMAL  LV FUNCTION  . Back surgery  12/24/09     Current Outpatient Prescriptions  Medication Sig Dispense Refill  . albuterol (PROVENTIL HFA;VENTOLIN HFA) 108 (90 BASE) MCG/ACT inhaler Inhale 2 puffs into the lungs every 6 (six) hours as needed for wheezing or shortness of breath. 1 Inhaler prn  . amLODipine (NORVASC) 5 MG tablet Take 1 tablet (5 mg total) by mouth daily. 30 tablet 5  . Ascorbic Acid (VITAMIN C PO) Take 1 tablet by mouth daily.     [name] atorvastatin (LIPITOR) 40 MG tablet TAKE 1 TABLET DAILY 90 tablet 1  . CARTIA XT 180 MG 24 hr capsule TAKE 1 CAPSULE DAILY 90 capsule 2  . furosemide (LASIX) 20 MG tablet Take 1 tablet (20 mg total) by mouth daily. 90 tablet 3  . guaiFENesin (  MUCINEX) 600 MG 12 hr tablet Take 600 mg by mouth 2 (two) times daily as needed for cough or to loosen phlegm.    [name] losartan (COZAAR) 100 MG tablet TAKE 1 TABLET DAILY 90 tablet 2  . LYSINE PO Take 1 tablet by mouth daily.     . metoprolol tartrate (LOPRESSOR) 25 MG tablet TAKE 2 TABLETS IN THE MORNING AND 1 TABLET IN THE EVENING 270 tablet 1  . Naproxen Sodium (ALEVE PO) Take 1 capsule by mouth 2 (two) times daily.     [name] omeprazole (PRILOSEC) 20 MG capsule Take 20 mg by mouth daily.      . potassium chloride (K-DUR) 10 MEQ tablet Take 1 tablet (10 mEq total) by mouth 2 (two) times daily. 180 tablet 3  . XARELTO 20 MG TABS tablet  TAKE 1 TABLET DAILY WITH SUPPER 90 tablet 2   No current facility-administered medications for this visit.    Allergies:   Ace inhibitors; Lopid; Niacin and related; Zocor; Penicillins; and Tamiflu    Social History:  The patient  reports that he has never smoked. He does not have any smokeless tobacco history on file. He reports that he does not drink alcohol or use illicit drugs.   Family History:  The patient's family history includes Asthma in his sister; COPD in his brother and father; Heart attack in his father; Heart disease in his brother and brother; Hypertension in his father. There is no history of Stroke.    ROS:  Please see the history of present illness.   Otherwise, review of systems are positive for none.   All other systems are reviewed and negative.    PHYSICAL EXAM: VS:  BP 110/80 mmHg  Pulse 89  Ht 6' (1.829 m)  Wt 221 lb 6.4 oz (100.426 kg)  BMI 30.02 kg/m2 , BMI Body mass index is 30.02 kg/(m^2). GEN: Well nourished, well developed, in no acute distress HEENT: normal Neck: no JVD, carotid bruits, or masses Cardiac: RRR; no murmurs, rubs, or gallops,no edema  Respiratory:  clear to auscultation bilaterally, normal work of breathing GI: soft, nontender, nondistended, + BS MS: no deformity or atrophy Skin: warm and dry, no rash Neuro:  Strength and sensation are intact Psych: euthymic mood, full affect   EKG:  EKG is not ordered today.    Recent Labs: 05/23/2014: ALT 21 08/24/2014: TSH 1.43 09/06/2014: BUN 21; Creatinine, Ser 1.04; Hemoglobin 14.4; Platelets 182.0; Potassium 4.7; Sodium 134*    Lipid Panel    Component Value Date/Time   CHOL 148 05/23/2014 0741   TRIG 85.0 05/23/2014 0741   HDL 53.80 05/23/2014 0741   CHOLHDL 3 05/23/2014 0741   VLDL 17.0 05/23/2014 0741   LDLCALC 77 05/23/2014 0741      Wt Readings from Last 3 Encounters:  09/06/14 221 lb 6.4 oz (100.426 kg)  08/31/14 219 lb (99.338 kg)  08/24/14 219 lb (99.338 kg)         ASSESSMENT AND PLAN:  1.  Abnormal Myoview stress test 1. paroxysmal atrial fibrillation. Presently sinus bradycardia. 2. hypertensive heart disease without heart failure.  3. frequent PVCs, quiescent 4. right bundle branch block 5. Hypercholesterolemia 6. right upper quadrant pain resolved 7. history of thrombocytopenia 8. Osteoarthritis 9. Left arm numbness and tingling with atypical left lateral chest pain. 10. Cough productive of white sputum. Chest x-ray today unremarkable. 11. Malaise and fatigue   Plan: We will arrange for left heart cardiac catheterization on Friday, July 29.  He  took his Xarelto last evening.  He will hold it today and tomorrow and Friday.  He will start taking a baby aspirin one daily. The risks and benefits of a cardiac catheterization including, but not limited to, death, stroke, MI, kidney damage and bleeding were discussed with the patient who indicates understanding and agrees to proceed.   Labs/ tests ordered today include:   Orders Placed This Encounter  Procedures  . Basic metabolic panel  . CBC with Differential/Platelet  . PTT  . INR/PT      Signed, Darlin Coco MD 09/06/2014 5:35 PM    [facility] [place], [place], [place]  [address] Phone: [phone]; Fax: [phone]

## 2014-09-11 ENCOUNTER — Encounter (HOSPITAL_COMMUNITY): Payer: Self-pay | Admitting: Interventional Cardiology

## 2014-09-11 MED FILL — Nitroglycerin IV Soln 100 MCG/ML in D5W: INTRA_ARTERIAL | Qty: 10 | Status: AC

## 2014-09-11 MED FILL — Heparin Sodium (Porcine) 2 Unit/ML in Sodium Chloride 0.9%: INTRAMUSCULAR | Qty: 1500 | Status: AC

## 2014-09-12 ENCOUNTER — Telehealth: Payer: Self-pay | Admitting: Cardiology

## 2014-09-12 NOTE — Telephone Encounter (Signed)
Spoke with pt. He is asking about follow up with Dr. Mare Ferrari.  I told him he has appt on August 24,2016. Pt aware of this appt. He still has cough (has had for last 4 weeks) but no other complaints.  He will keep appt for August 24th but let us know if he has problems prior to this.

## 2014-09-12 NOTE — Telephone Encounter (Signed)
New message      Pt states he recently had a cath.  He has questions.  Please call

## 2014-09-15 ENCOUNTER — Other Ambulatory Visit: Payer: Self-pay | Admitting: *Deleted

## 2014-09-28 ENCOUNTER — Other Ambulatory Visit (INDEPENDENT_AMBULATORY_CARE_PROVIDER_SITE_OTHER): Payer: Medicare Other | Admitting: *Deleted

## 2014-09-28 DIAGNOSIS — I10 Essential (primary) hypertension: Secondary | ICD-10-CM | POA: Diagnosis not present

## 2014-09-28 DIAGNOSIS — D696 Thrombocytopenia, unspecified: Secondary | ICD-10-CM | POA: Diagnosis not present

## 2014-09-28 LAB — BASIC METABOLIC PANEL
BUN: 17 mg/dL (ref 6–23)
CHLORIDE: 100 meq/L (ref 96–112)
CO2: 28 meq/L (ref 19–32)
Calcium: 9.3 mg/dL (ref 8.4–10.5)
Creatinine, Ser: 1.04 mg/dL (ref 0.40–1.50)
GFR: 73.4 mL/min (ref >60.00)
Glucose, Bld: 106 mg/dL — ABNORMAL HIGH (ref 70–99)
Potassium: 4.2 mEq/L (ref 3.5–5.1)
Sodium: 135 mEq/L (ref 135–145)

## 2014-09-28 LAB — CBC WITH DIFFERENTIAL/PLATELET
BASOS PCT: 0.4 % (ref 0.0–3.0)
Basophils Absolute: 0 10*3/uL (ref 0.0–0.1)
Eosinophils Absolute: 0.1 10*3/uL (ref 0.0–0.7)
Eosinophils Relative: 1.2 % (ref 0.0–5.0)
HEMATOCRIT: 42.3 % (ref 39.0–52.0)
Hemoglobin: 14.1 g/dL (ref 13.0–17.0)
LYMPHS PCT: 32.7 % (ref 12.0–46.0)
Lymphs Abs: 2.3 10*3/uL (ref 0.7–4.0)
MCHC: 33.5 g/dL (ref 30.0–36.0)
MCV: 91.3 fl (ref 78.0–100.0)
MONO ABS: 0.7 10*3/uL (ref 0.1–1.0)
Monocytes Relative: 9.8 % (ref 3.0–12.0)
NEUTROS ABS: 4 10*3/uL (ref 1.4–7.7)
Neutrophils Relative %: 55.9 % (ref 43.0–77.0)
PLATELETS: 171 10*3/uL (ref 150.0–400.0)
RBC: 4.63 Mil/uL (ref 4.22–5.81)
RDW: 14.6 % (ref 11.5–15.5)
WBC: 7.2 10*3/uL (ref 4.0–10.5)

## 2014-09-28 LAB — LIPID PANEL
CHOL/HDL RATIO: 3
Cholesterol: 161 mg/dL (ref 0–200)
HDL: 51.3 mg/dL (ref >39.00)
LDL Cholesterol: 92 mg/dL (ref 0–99)
NonHDL: 109.72
Triglycerides: 91 mg/dL (ref 0.0–149.0)
VLDL: 18.2 mg/dL (ref 0.0–40.0)

## 2014-09-28 LAB — HEPATIC FUNCTION PANEL
ALT: 17 U/L (ref 0–53)
AST: 19 U/L (ref 0–37)
Albumin: 4.2 g/dL (ref 3.5–5.2)
Alkaline Phosphatase: 76 U/L (ref 39–117)
BILIRUBIN DIRECT: 0.2 mg/dL (ref 0.0–0.3)
BILIRUBIN TOTAL: 0.7 mg/dL (ref 0.2–1.2)
Total Protein: 6.9 g/dL (ref 6.0–8.3)

## 2014-09-28 NOTE — Progress Notes (Signed)
Quick Note:  Please make copy of labs for patient visit. ______ 

## 2014-10-04 ENCOUNTER — Ambulatory Visit (INDEPENDENT_AMBULATORY_CARE_PROVIDER_SITE_OTHER): Payer: Medicare Other | Admitting: Cardiology

## 2014-10-04 ENCOUNTER — Encounter: Payer: Self-pay | Admitting: Cardiology

## 2014-10-04 VITALS — BP 138/80 | HR 64 | Ht 72.0 in | Wt 224.0 lb

## 2014-10-04 DIAGNOSIS — R5383 Other fatigue: Secondary | ICD-10-CM

## 2014-10-04 DIAGNOSIS — I480 Paroxysmal atrial fibrillation: Secondary | ICD-10-CM

## 2014-10-04 DIAGNOSIS — R609 Edema, unspecified: Secondary | ICD-10-CM

## 2014-10-04 DIAGNOSIS — I10 Essential (primary) hypertension: Secondary | ICD-10-CM | POA: Diagnosis not present

## 2014-10-04 DIAGNOSIS — R5381 Other malaise: Secondary | ICD-10-CM

## 2014-10-04 MED ORDER — ATORVASTATIN CALCIUM 80 MG PO TABS: 80.0000 mg | ORAL_TABLET | Freq: Every day | ORAL | Status: DC

## 2014-10-04 NOTE — Progress Notes (Signed)
Cardiology Office Note   Date:  10/04/2014   ID:  Theodore [name], DOB August 26, 1934, MRN [id number]  PCP:  Warren Danes, MD  Cardiologist: Darlin Coco MD  Chief Complaint  Patient presents with  . Leg Swelling      History of Present Illness: Theodore [name] is a 80 y.o. male who presents for scheduled follow-up office visit   He has a past history of paroxysmal atrial fibrillation and a history of essential hypertension and dyslipidemia. He does have a history of asthma and is followed by Dr. Annamaria Boots. Dr. Annamaria Boots recently did extensive pulmonary function studies and subsequently switched him to a different steroid-based inhaler. He feels that the new inhaler has helped his exertional dyspnea. He has had a lot of orthopedic issues and problems with low back pain but that has recently been more stable. In April 2014 he was found to be in paroxysmal atrial fibrillation. Since then he has been on anticoagulation. He was initially on Pradaxa and subsequently switched to Xarelto. He has a past history of thrombocytopenia and has seen Dr. Learta Codding. No treatment indicated. His last platelet count was more than 140,000. This was in the summer of 2015. Several months ago the patient was at the beach. He suddenly felt very bad. He had some left chest and left arm discomfort. He also developed a cough. Because of his new symptoms he came back from the beach early. He was seen in the office on 08/24/14. He had a chest x-ray showing COPD and normal heart size and no CHF or pneumonia. He had a Lexi scan mild perfusion stress test on 08/31/14 which showed an old inferior wall scar with peri-infarct ischemia and his ejection fraction was low at 49%. The patient had had a prior perfusion scan several years ago which had not shown any perfusion deficit. Because of the abnormal Myoview scan the patient underwent cardiac catheterization with Dr. Daneen Schick on 09/08/14.  His cardiac catheterization  revealed coronary artery disease but not in the distribution of his abnormal Myoview scan.  Medical therapy was recommended as noted below:    1. 1st Mrg lesion, 30% stenosed. 2. Prox LAD lesion, 50% stenosed. 3. Ost LAD lesion, 70% stenosed. 4. Prox RCA to Dist RCA lesion, 35% stenosed.   50-70% ostial/proximal LAD stenosis within the heavily calcified segment. Recent myocardial perfusion energy and within the past week did not demonstrate anterior ischemia.  Heavily calcified but widely patent circumflex and right coronary with luminal irregularities noted in the proximal mid and distal segments.  Mild global left ventricular dysfunction with an ejection fraction in the 45-50% range   Recommendations:   The patient's symptoms are mixed. With exertion he experiences dyspnea. At rest and randomly he has neck jaw and left elbow discomfort. When both the angiogram and the myocardial perfusion study were considered together, no justification for percutaneous intervention on the LAD could be made.  Close clinical follow-up  Aggressive risk factor modification   Since his cardiac catheterization the patient has not been having any left arm pain.  His cough is better.  He still complains of lack of energy.  Is also been having some leg swelling worse in the evening which he thinks started about the time that we put him on amlodipine.  The patient is on low-dose furosemide 20 mg daily.  He has been on Naprosyn twice a day which may be contributing to his edema.  He gives a history of having had acute  rheumatic fever in high school.  His previous echocardiogram several years ago did not show any significant valvular heart disease.  There was mild AI and mild MR on his echo. He is currently on Lipitor 40 mg daily.  He is not having any definite myalgias.  We will increase his Lipitor to 80 mg daily for aggressive risk factor modification  Past Medical History  Diagnosis Date  . Hypertension     . Hypercholesterolemia   . SOB (shortness of breath)   . Peripheral edema   . Nocturia   . Right bundle branch block   . PAF (paroxysmal atrial fibrillation) January 2013    Placed on Pradaxa. Did not require cardioversion; spontaneously converted  . Tendonitis of elbow, left     Past Surgical History  Procedure Laterality Date  . Cardiac catheterization  2009    NONOBSTRUCTIVE ATHERSCLEROTIC CORONARY DISEASE AND NORMAL  LV FUNCTION  . Back surgery  12/24/09  . Cardiac catheterization N/A 09/08/2014    Procedure: Left Heart Cath and Coronary Angiography;  Surgeon: Belva Crome, MD;  Location: [facility];  Service: Cardiovascular;  Laterality: N/A;     Current Outpatient Prescriptions  Medication Sig Dispense Refill  . albuterol (PROVENTIL HFA;VENTOLIN HFA) 108 (90 BASE) MCG/ACT inhaler Inhale 2 puffs into the lungs every 6 (six) hours as needed for wheezing or shortness of breath. 1 Inhaler prn  . Ascorbic Acid (VITAMIN C PO) Take 1 tablet by mouth daily.     [name] atorvastatin (LIPITOR) 80 MG tablet Take 1 tablet (80 mg total) by mouth daily. 90 tablet 3  . diltiazem (CARTIA XT) 180 MG 24 hr capsule Take 1 capsule (180 mg total) by mouth daily. 90 capsule 2  . furosemide (LASIX) 20 MG tablet Take 1 tablet (20 mg total) by mouth daily. 90 tablet 3  . losartan (COZAAR) 100 MG tablet TAKE 1 TABLET DAILY 90 tablet 2  . LYSINE PO Take 1 tablet by mouth daily.     . metoprolol tartrate (LOPRESSOR) 25 MG tablet TAKE 2 TABLETS IN THE MORNING AND 1 TABLET IN THE EVENING 270 tablet 1  . naproxen sodium (ANAPROX) 220 MG tablet Take 220 mg by mouth daily.    [name] omeprazole (PRILOSEC) 20 MG capsule Take 20 mg by mouth daily.      . potassium chloride (K-DUR) 10 MEQ tablet Take 1 tablet (10 mEq total) by mouth 2 (two) times daily. 180 tablet 3  . XARELTO 20 MG TABS tablet TAKE 1 TABLET DAILY WITH SUPPER 90 tablet 2   No current facility-administered medications for this visit.     Allergies:   Ace inhibitors; Lopid; Niacin and related; Zocor; Penicillins; and Tamiflu    Social History:  The patient  reports that he has never smoked. He does not have any smokeless tobacco history on file. He reports that he does not drink alcohol or use illicit drugs.   Family History:  The patient's family history includes Asthma in his sister; COPD in his brother and father; Heart attack in his father; Heart disease in his brother and brother; Hypertension in his father. There is no history of Stroke.    ROS:  Please see the history of present illness.   Otherwise, review of systems are positive for none.   All other systems are reviewed and negative.  He had a normal TSH level in July   PHYSICAL EXAM: VS:  BP 138/80 mmHg  Pulse 64  Ht 6' (1.829  m)  Wt 224 lb (101.606 kg)  BMI 30.37 kg/m2 , BMI Body mass index is 30.37 kg/(m^2). GEN: Well nourished, well developed, in no acute distress HEENT: normal Neck: no JVD, carotid bruits, or masses Cardiac: RRR; no murmurs, rubs, or gallops, there is mild peripheral edema early in the morning  Respiratory:  clear to auscultation bilaterally, normal work of breathing GI: soft, nontender, nondistended, + BS MS: no deformity or atrophy Skin: warm and dry, no rash Neuro:  Strength and sensation are intact Psych: euthymic mood, full affect   EKG:  EKG is not ordered today.   Recent Labs: 08/24/2014: TSH 1.43 09/28/2014: ALT 17; BUN 17; Creatinine, Ser 1.04; Hemoglobin 14.1; Platelets 171.0; Potassium 4.2; Sodium 135    Lipid Panel    Component Value Date/Time   CHOL 161 09/28/2014 0736   TRIG 91.0 09/28/2014 0736   HDL 51.30 09/28/2014 0736   CHOLHDL 3 09/28/2014 0736   VLDL 18.2 09/28/2014 0736   LDLCALC 92 09/28/2014 0736      Wt Readings from Last 3 Encounters:  10/04/14 224 lb (101.606 kg)  09/08/14 220 lb (99.791 kg)  09/06/14 221 lb 6.4 oz (100.426 kg)        ASSESSMENT AND PLAN:  1. paroxysmal atrial  fibrillation. Presently sinus bradycardia.  On long-term Xarelto 2. hypertensive heart disease without heart failure. Peripheral edema may be from his amlodipine 3. frequent PVCs, quiescent 4. right bundle branch block 5. Hypercholesterolemia 6. right upper quadrant pain resolved 7. history of thrombocytopenia 8. Osteoarthritis 9.Recent abnormal Myoview leading to cardiac catheterization.  Myoview showed inferior wall scar with peri-infarct ischemia.  It did not show any anterior ischemia.  Cardiac catheterization showed borderline lesions in the anterior wall.  There was no intervention.  Aggressive medical management was recommended. 10. Cough , improved 11. Malaise and fatigue   Current medicines are reviewed at length with the patient today.  The patient has concerns regarding medicines.  The following changes have been made:  We are stopping his amlodipine.  This may be causing some of his malaise as well as his peripheral edema.  We are increasing his Lipitor up to 80 mg daily.  He thinks he can get by with just one Naprosyn a day and we will reduce his dose to just once a day.  We will obtain an echocardiogram.  If his peripheral edema does not improve with stopping amlodipine, we can consider increasing his Lasix.  His renal function has been normal.  Labs/ tests ordered today include:   Orders Placed This Encounter  Procedures  . Basic metabolic panel  . ECHOCARDIOGRAM COMPLETE    Disposition: Recheck in 2 months for office visit EKG and basal metabolic panel  Signed, Darlin Coco MD 10/04/2014 10:32 AM    [address], [place], [place]  [address] Phone: [phone]; Fax: [phone]

## 2014-10-04 NOTE — Patient Instructions (Addendum)
Medication Instructions:  DECREASE YOUR NAPROXEN TO ONCE A DAY  STOP AMLODIPINE   INCREASE YOUR LIPITOR TO 80 MG DAILY  Labwork: NONE  Testing/Procedures: Your physician has requested that you have an echocardiogram. Echocardiography is a painless test that uses sound waves to create images of your heart. It provides your doctor with information about the size and shape of your heart and how well your heart's chambers and valves are working. This procedure takes approximately one hour. There are no restrictions for this procedure.  Follow-Up: Your physician recommends that you schedule a follow-up appointment in: 2 MONTH OV/EKG/BMET  .

## 2014-10-05 ENCOUNTER — Ambulatory Visit: Payer: Medicare Other | Admitting: Cardiology

## 2014-10-09 ENCOUNTER — Other Ambulatory Visit: Payer: Self-pay

## 2014-10-09 ENCOUNTER — Ambulatory Visit (HOSPITAL_COMMUNITY): Payer: Medicare Other | Attending: Cardiology

## 2014-10-09 DIAGNOSIS — I340 Nonrheumatic mitral (valve) insufficiency: Secondary | ICD-10-CM | POA: Diagnosis not present

## 2014-10-09 DIAGNOSIS — I10 Essential (primary) hypertension: Secondary | ICD-10-CM | POA: Insufficient documentation

## 2014-10-09 DIAGNOSIS — R5381 Other malaise: Secondary | ICD-10-CM | POA: Diagnosis not present

## 2014-10-09 DIAGNOSIS — Z8249 Family history of ischemic heart disease and other diseases of the circulatory system: Secondary | ICD-10-CM | POA: Diagnosis not present

## 2014-10-09 DIAGNOSIS — I351 Nonrheumatic aortic (valve) insufficiency: Secondary | ICD-10-CM | POA: Insufficient documentation

## 2014-10-09 DIAGNOSIS — I480 Paroxysmal atrial fibrillation: Secondary | ICD-10-CM

## 2014-10-09 DIAGNOSIS — E785 Hyperlipidemia, unspecified: Secondary | ICD-10-CM | POA: Insufficient documentation

## 2014-10-09 DIAGNOSIS — R5383 Other fatigue: Secondary | ICD-10-CM | POA: Insufficient documentation

## 2014-10-09 DIAGNOSIS — I517 Cardiomegaly: Secondary | ICD-10-CM | POA: Insufficient documentation

## 2014-11-03 ENCOUNTER — Telehealth: Payer: Self-pay | Admitting: Cardiology

## 2014-11-03 NOTE — Telephone Encounter (Signed)
Spoke with patient and pharmacist Patient had Pneumo vaccine in 2011 Recommended Prevnar 13

## 2014-11-03 NOTE — Telephone Encounter (Signed)
New Message  Rep from [facility] pharmacy calling to speak w/ RN concerning which pneumonia shot, [name] prescribed to pt; please call back and discuss.

## 2014-11-06 NOTE — Telephone Encounter (Signed)
Agree 

## 2014-12-14 ENCOUNTER — Other Ambulatory Visit: Payer: Medicare Other

## 2014-12-18 ENCOUNTER — Other Ambulatory Visit (INDEPENDENT_AMBULATORY_CARE_PROVIDER_SITE_OTHER): Payer: Medicare Other | Admitting: *Deleted

## 2014-12-18 DIAGNOSIS — I10 Essential (primary) hypertension: Secondary | ICD-10-CM | POA: Diagnosis not present

## 2014-12-18 LAB — BASIC METABOLIC PANEL
BUN: 12 mg/dL (ref 7–25)
CHLORIDE: 103 mmol/L (ref 98–110)
CO2: 26 mmol/L (ref 20–31)
CREATININE: 1.05 mg/dL (ref 0.70–1.18)
Calcium: 9 mg/dL (ref 8.6–10.3)
GLUCOSE: 99 mg/dL (ref 65–99)
Potassium: 5 mmol/L (ref 3.5–5.3)
Sodium: 135 mmol/L (ref 135–146)

## 2014-12-19 NOTE — Progress Notes (Signed)
Quick Note:  Please make copy of labs for patient visit. ______ 

## 2014-12-21 ENCOUNTER — Encounter: Payer: Self-pay | Admitting: Cardiology

## 2014-12-21 ENCOUNTER — Ambulatory Visit (INDEPENDENT_AMBULATORY_CARE_PROVIDER_SITE_OTHER): Payer: Medicare Other | Admitting: Cardiology

## 2014-12-21 VITALS — BP 150/80 | HR 61 | Ht 72.0 in | Wt 219.8 lb

## 2014-12-21 DIAGNOSIS — R0602 Shortness of breath: Secondary | ICD-10-CM | POA: Diagnosis not present

## 2014-12-21 DIAGNOSIS — R609 Edema, unspecified: Secondary | ICD-10-CM | POA: Diagnosis not present

## 2014-12-21 DIAGNOSIS — I480 Paroxysmal atrial fibrillation: Secondary | ICD-10-CM

## 2014-12-21 MED ORDER — METOPROLOL TARTRATE 50 MG PO TABS: 50.0000 mg | ORAL_TABLET | Freq: Two times a day (BID) | ORAL | Status: DC

## 2014-12-21 NOTE — Patient Instructions (Signed)
Medication Instructions:  INCREASE YOUR LOPRESSOR (METOPROLOL) TO 50 MG TWICE A DAY   Labwork: NONE  Testing/Procedures: NONE  Follow-Up: Your physician recommends that you schedule a follow-up appointment in: 4 months with fasting labs (lp/bmet/hfp) AND EKG WITH Tera Helper NP OR SCOTT W PA   If you need a refill on your cardiac medications before your next appointment, please call your pharmacy.

## 2014-12-21 NOTE — Progress Notes (Addendum)
Cardiology Office Note   Date:  12/21/2014   ID:  Theodore [name], DOB 12/03/1934, MRN [id number]  PCP:  Theodore Danes, MD  Cardiologist: Theodore Coco MD  No chief complaint on file.     History of Present Illness: Theodore [name] is a 80 y.o. male who presents for a four-month follow-up visit. He has a past history of paroxysmal atrial fibrillation and a history of essential hypertension and dyslipidemia. He does have a history of asthma and is followed by Dr. Annamaria [name]. Dr. Annamaria [name] recently did extensive pulmonary function studies and subsequently switched him to a different steroid-based inhaler. He feels that the new inhaler has helped his exertional dyspnea. He has had a lot of orthopedic issues and problems with low back pain but that has recently been more stable. In April 2014 he was found to be in paroxysmal atrial fibrillation. Since then he has been on anticoagulation. He was initially on Pradaxa and subsequently switched to Xarelto. He has a past history of thrombocytopenia and has seen Dr. Learta [name]. No treatment indicated. His last platelet count was more than 140,000. This was in the summer of 2015. Several months ago the patient was at the beach. He suddenly felt very bad. He had some left chest and left arm discomfort. He also developed a cough. Because of his new symptoms he came back from the beach early. He was seen in the office on 08/24/14. He had a chest x-ray showing COPD and normal heart size and no CHF or pneumonia. He had a Lexi scan mild perfusion stress test on 08/31/14 which showed an old inferior wall scar with peri-infarct ischemia and his ejection fraction was low at 49%. The patient had had a prior perfusion scan several years ago which had not shown any perfusion deficit. Because of the abnormal Myoview scan the patient underwent cardiac catheterization with Dr. Daneen Schick on 09/08/14. His cardiac catheterization revealed coronary artery disease but  not in the distribution of his abnormal Myoview scan. Medical therapy was recommended as noted below:    1. 1st Mrg lesion, 30% stenosed. 2. Prox LAD lesion, 50% stenosed. 3. Ost LAD lesion, 70% stenosed. 4. Prox RCA to Dist RCA lesion, 35% stenosed.   50-70% ostial/proximal LAD stenosis within the heavily calcified segment. Recent myocardial perfusion energy and within the past week did not demonstrate anterior ischemia.  Heavily calcified but widely patent circumflex and right coronary with luminal irregularities noted in the proximal mid and distal segments.  Mild global left ventricular dysfunction with an ejection fraction in the 45-50% range   Recommendations:   The patient's symptoms are mixed. With exertion he experiences dyspnea. At rest and randomly he has neck jaw and left elbow discomfort. When both the angiogram and the myocardial perfusion study were considered together, no justification for percutaneous intervention on the LAD could be made.  Close clinical follow-up  Aggressive risk factor modification   Since his cardiac catheterization the patient has not been having any left arm pain. His cough is better. He still complains of lack of energy. Is also been having some leg swelling worse in the evening which he thinks started about the time that we put him on amlodipine. The patient is on low-dose furosemide 20 mg daily. He has been on Naprosyn twice a day which may be contributing to his edema. He gives a history of having had acute rheumatic fever in high school. His previous echocardiogram several years ago did not show any significant  valvular heart disease. There was mild AI and mild MR on his echo. He is currently on Lipitor 40 mg daily. He is not having any definite myalgias. We will increase his Lipitor to 80 mg daily for aggressive risk factor modification       Since we last saw him he had a follow-up echocardiogram on 10/09/14 which showed  an ejection fraction had improved back to 55-60%.  There was slight mitral regurgitation and slight aortic insufficiency. The patient has had very little chest discomfort.  He has never had to take a sublingual nitroglycerin. His previous peripheral edema improved when he cut back on his nonsteroidal anti-inflammatory agents and stopped his amlodipine. His weight is down 5 pounds since last visit. His blood pressure has been remaining high on the systolic side.    Past Medical History  Diagnosis Date  . Hypertension   . Hypercholesterolemia   . SOB (shortness of breath)   . Peripheral edema   . Nocturia   . Right bundle branch block   . PAF (paroxysmal atrial fibrillation) [facility]) January 2013    Placed on Pradaxa. Did not require cardioversion; spontaneously converted  . Tendonitis of elbow, left     Past Surgical History  Procedure Laterality Date  . Cardiac catheterization  2009    NONOBSTRUCTIVE ATHERSCLEROTIC CORONARY DISEASE AND NORMAL  LV FUNCTION  . Back surgery  12/24/09  . Cardiac catheterization N/A 09/08/2014    Procedure: Left Heart Cath and Coronary Angiography;  Surgeon: Belva Crome, MD;  Location: [facility];  Service: Cardiovascular;  Laterality: N/A;     Current Outpatient Prescriptions  Medication Sig Dispense Refill  . albuterol (PROVENTIL HFA;VENTOLIN HFA) 108 (90 BASE) MCG/ACT inhaler Inhale 2 puffs into the lungs every 6 (six) hours as needed for wheezing or shortness of breath. 1 Inhaler prn  . Ascorbic Acid (VITAMIN C PO) Take 1 tablet by mouth daily.     [name] atorvastatin (LIPITOR) 80 MG tablet Take 1 tablet (80 mg total) by mouth daily. 90 tablet 3  . diltiazem (CARTIA XT) 180 MG 24 hr capsule Take 1 capsule (180 mg total) by mouth daily. 90 capsule 2  . furosemide (LASIX) 20 MG tablet Take 1 tablet (20 mg total) by mouth daily. 90 tablet 3  . KLOR-CON M10 10 MEQ tablet Take 10 mEq by mouth 2 (two) times daily.    [name] losartan (COZAAR) 100 MG tablet  Take 100 mg by mouth daily.    [name] LYSINE PO Take 1 tablet by mouth daily.     . metoprolol tartrate (LOPRESSOR) 50 MG tablet Take 1 tablet (50 mg total) by mouth 2 (two) times daily. 180 tablet 3  . naproxen sodium (ANAPROX) 220 MG tablet Take 220 mg by mouth daily.    [name] omeprazole (PRILOSEC) 20 MG capsule Take 20 mg by mouth daily.      . potassium chloride (K-DUR) 10 MEQ tablet Take 1 tablet (10 mEq total) by mouth 2 (two) times daily. 180 tablet 3  . XARELTO 20 MG TABS tablet TAKE 1 TABLET DAILY WITH SUPPER 90 tablet 2   No current facility-administered medications for this visit.    Allergies:   Ace inhibitors; Amlodipine; Lopid; Niacin and related; Zocor; Penicillins; and Tamiflu    Social History:  The patient  reports that he has never smoked. He does not have any smokeless tobacco history on file. He reports that he does not drink alcohol or use illicit drugs.  Family History:  The patient's family history includes Asthma in his sister; COPD in his brother and father; Heart attack in his father; Heart disease in his brother and brother; Hypertension in his father. There is no history of Stroke.    ROS:  Please see the history of present illness.   Otherwise, review of systems are positive for none.   All other systems are reviewed and negative.    PHYSICAL EXAM: VS:  BP 150/80 mmHg  Pulse 61  Ht 6' (1.829 m)  Wt 219 lb 12.8 oz (99.701 kg)  BMI 29.80 kg/m2 , BMI Body mass index is 29.8 kg/(m^2). GEN: Well nourished, well developed, in no acute distress HEENT: normal Neck: no JVD, carotid bruits, or masses Cardiac: RRR; no murmurs, rubs, or gallops,no edema  Respiratory:  clear to auscultation bilaterally, normal work of breathing GI: soft, nontender, nondistended, + BS MS: no deformity or atrophy Skin: warm and dry, no rash Neuro:  Strength and sensation are intact Psych: euthymic mood, full affect   EKG:  EKG is ordered today. The ekg ordered today demonstrates normal  sinus rhythm at 65 bpm.  Right bundle branch block.  Left axis deviation.  Since prior tracing of 08/24/14, no significant change   Recent Labs: 08/24/2014: TSH 1.43 09/28/2014: ALT 17; Hemoglobin 14.1; Platelets 171.0 12/18/2014: BUN 12; Creat 1.05; Potassium 5.0; Sodium 135    Lipid Panel    Component Value Date/Time   CHOL 161 09/28/2014 0736   TRIG 91.0 09/28/2014 0736   HDL 51.30 09/28/2014 0736   CHOLHDL 3 09/28/2014 0736   VLDL 18.2 09/28/2014 0736   LDLCALC 92 09/28/2014 0736      Wt Readings from Last 3 Encounters:  12/21/14 219 lb 12.8 oz (99.701 kg)  10/04/14 224 lb (101.606 kg)  09/08/14 220 lb (99.791 kg)         ASSESSMENT AND PLAN:  1. paroxysmal atrial fibrillation. Presently sinus bradycardia. On long-term Xarelto.  Since last visit he had one episode of paroxysmal atrial fibrillation which lasted about 5 days and resolved on its own.  When he is in atrial fibrillation he has no energy and his blood pressure is much lower. 2. hypertensive heart disease without heart failure.Previous peripheral edema resolved when he stopped his amlodipine. 3. frequent PVCs, quiescent 4. right bundle branch block 5. Hypercholesterolemia 6. right upper quadrant pain resolved 7. history of thrombocytopenia 8. Osteoarthritis 9.Recent abnormal Myoview leading to cardiac catheterization. Myoview showed inferior wall scar with peri-infarct ischemia. It did not show any anterior ischemia. Cardiac catheterization showed borderline lesions in the anterior wall. There was no intervention. Aggressive medical management was recommended. 10. Cough , improved 11. Malaise and fatigue   Current medicines are reviewed at length with the patient today. The patient has concerns regarding medicines.  Disposition: Continue current medication.  He will increase his Lopressor up to 50 mg twice a day to help with blood pressure control.  He will continue to watch diet carefully.  He  will return in 4 months for a follow-up office visit EKG lipid panel hepatic function panel and basal metabolic panel.  He has not been having any bleeding problems from his Xarelto.   Current medicines are reviewed at length with the patient today.  The patient does not have concerns regarding medicines.  The following changes have been made:  Increase metoprolol to heart rate 250 mg twice a day for better blood pressure control.  Labs/ tests ordered today include:   Orders  Placed This Encounter  Procedures  . EKG 12-Lead    Disposition: The patient knows that he will need a primary care provider.  He will be calling [facility].   Berna Spare MD 12/21/2014 1:24 PM    [facility] [address], [place],   [address] Phone: [phone]; Fax: [phone]

## 2014-12-26 ENCOUNTER — Other Ambulatory Visit: Payer: Self-pay | Admitting: Cardiology

## 2015-01-09 ENCOUNTER — Other Ambulatory Visit: Payer: Self-pay | Admitting: Cardiology

## 2015-01-18 ENCOUNTER — Ambulatory Visit (INDEPENDENT_AMBULATORY_CARE_PROVIDER_SITE_OTHER): Payer: Medicare Other | Admitting: Family Medicine

## 2015-01-18 VITALS — BP 130/80 | HR 73 | Temp 97.7°F | Resp 17 | Ht 70.0 in | Wt 220.0 lb

## 2015-01-18 DIAGNOSIS — J029 Acute pharyngitis, unspecified: Secondary | ICD-10-CM

## 2015-01-18 LAB — POCT RAPID STREP A (OFFICE): Rapid Strep A Screen: NEGATIVE

## 2015-01-18 NOTE — Patient Instructions (Signed)
The strep is negative.  I think your sore throat is viral.

## 2015-01-18 NOTE — Progress Notes (Signed)
This is a 80 year old gentleman presents with a sore throat on the left side. It began yesterday and rapidly worsen so that it kept him up much of the night. He describes being a sharp pain.  Is associated initially with some tinnitus in the left ear but this is largely resolved. He's had no fever.  Patient's past medical history is significant for rheumatic heart disease, with subsequent heart murmur and right bundle branch block with intermittent atrial fibrillation. He's had no significant fatigue lately which is characterized past episodes of atrial fibrillation.  Objective: No acute distress BP 130/80 mmHg  Pulse 73  Temp(Src) 97.7 F (36.5 C) (Oral)  Resp 17  Ht 5\' 10"  (1.778 m)  Wt 220 lb (99.791 kg)  BMI 31.57 kg/m2  SpO2 98% HEENT: Normal TMs, mildly erythematous left side throat. Eyes are normal with conjugate vision, extra ocular movement, and scleral color.  Chest: Clear Heart: Occasionally irregular, soft 1 to 2/6 systolic ejection murmur without radiation, no rub heard.  Results for orders placed or performed in visit on 01/18/15  POCT rapid strep A  Result Value Ref Range   Rapid Strep A Screen Negative Negative     Assessment: Acute, apparently self-limited sore throat which is gradually resolving over today.  Plan:  Chloraseptic  Robyn Haber, MD

## 2015-01-20 LAB — CULTURE, GROUP A STREP: Organism ID, Bacteria: NORMAL

## 2015-02-27 ENCOUNTER — Other Ambulatory Visit: Payer: Self-pay | Admitting: Cardiology

## 2015-02-27 ENCOUNTER — Telehealth: Payer: Self-pay | Admitting: Cardiology

## 2015-02-27 NOTE — Telephone Encounter (Signed)
Called patient and he has stopped taking his Lipitor 80 mg. Patient stated he is going to take Lipitor 40 mg for now, and he wanted to let Dr. Mare Ferrari and [name] know. Will forward to Dr. Mare Ferrari and his nurse.

## 2015-02-27 NOTE — Telephone Encounter (Signed)
Agree with the reduction in dose.

## 2015-02-27 NOTE — Telephone Encounter (Signed)
New problem    Pt been having body aches and pain and thinks it may be coming from him taking 80mg  of Lipitor and pt stated he want to go back to taking 40mg  . Please advise.

## 2015-02-28 NOTE — Telephone Encounter (Signed)
Spoke with patient and advised aches no better at the Atorvastatin 80 mg 1/2 tablet daily to call back. Verbalized understanding

## 2015-03-15 ENCOUNTER — Telehealth: Payer: Self-pay | Admitting: Cardiology

## 2015-03-15 ENCOUNTER — Other Ambulatory Visit: Payer: Self-pay | Admitting: Cardiology

## 2015-03-15 DIAGNOSIS — M2550 Pain in unspecified joint: Secondary | ICD-10-CM

## 2015-03-15 NOTE — Telephone Encounter (Signed)
New Message  Pt requested to speak w/ RN concerning recent rxn to cholesterol med. Please call back and discuss.

## 2015-03-15 NOTE — Telephone Encounter (Signed)
Spoke with patient and he continues to have joint pain without the Lipitor  He is very concerned with muscle damage and not taking any medication for his cholesterol Discussed with  Dr. Mare Ferrari and will have patient come for CRP, HFP, CK, and sed rate Advised patient and scheduled labs

## 2015-03-16 ENCOUNTER — Other Ambulatory Visit (INDEPENDENT_AMBULATORY_CARE_PROVIDER_SITE_OTHER): Payer: Medicare Other | Admitting: *Deleted

## 2015-03-16 DIAGNOSIS — M2550 Pain in unspecified joint: Secondary | ICD-10-CM

## 2015-03-16 LAB — HEPATIC FUNCTION PANEL
ALBUMIN: 4.1 g/dL (ref 3.6–5.1)
ALT: 19 U/L (ref 9–46)
AST: 21 U/L (ref 10–35)
Alkaline Phosphatase: 61 U/L (ref 40–115)
BILIRUBIN DIRECT: 0.1 mg/dL (ref <=0.2)
Indirect Bilirubin: 0.4 mg/dL (ref 0.2–1.2)
TOTAL PROTEIN: 6.5 g/dL (ref 6.1–8.1)
Total Bilirubin: 0.5 mg/dL (ref 0.2–1.2)

## 2015-03-16 LAB — C-REACTIVE PROTEIN

## 2015-03-16 LAB — CK: CK TOTAL: 76 U/L (ref 7–232)

## 2015-03-17 LAB — SEDIMENTATION RATE: Sed Rate: 1 mm/hr (ref 0–20)

## 2015-03-18 NOTE — Progress Notes (Signed)
Quick Note:  Please report to patient. The recent labs are stable. Continue same medication and careful diet. No evidence of muscle damage in blood work. No evidence of active inflammation. Sed rate and CRP normal. LFTs normal. ______

## 2015-03-27 ENCOUNTER — Telehealth: Payer: Self-pay | Admitting: Cardiology

## 2015-03-27 NOTE — Telephone Encounter (Signed)
He said [name] was supposed to have talked to [name] last week about his Lipitor medicine.He says he still have not heard anything.

## 2015-03-28 MED ORDER — OMEGA-3-ACID ETHYL ESTERS 1 G PO CAPS: 1.0000 g | ORAL_CAPSULE | Freq: Two times a day (BID) | ORAL | Status: DC

## 2015-03-28 NOTE — Telephone Encounter (Signed)
Notes Recorded by Earvin Hansen on 03/28/2015 at 4:28 PM Reviewed labs with patient  He is concerned about being off Lipitor  Discussed with Dr. Mare Ferrari and will Rx Lovaza 1 g 2 tablets twice a day Advised patient, verbalized understanding

## 2015-03-29 ENCOUNTER — Telehealth: Payer: Self-pay | Admitting: Cardiology

## 2015-03-29 MED ORDER — OMEGA-3-ACID ETHYL ESTERS 1 G PO CAPS: 2.0000 g | ORAL_CAPSULE | Freq: Two times a day (BID) | ORAL | Status: DC

## 2015-03-29 NOTE — Telephone Encounter (Signed)
New message ° ° ° ° °Talk to Melinda °

## 2015-03-29 NOTE — Telephone Encounter (Signed)
Call for PA on Lovaza [phone]

## 2015-04-02 NOTE — Telephone Encounter (Signed)
PA form requested 

## 2015-04-04 ENCOUNTER — Encounter: Payer: Self-pay | Admitting: Cardiology

## 2015-04-04 NOTE — Telephone Encounter (Signed)
This encounter was created in error - please disregard.

## 2015-04-04 NOTE — Telephone Encounter (Signed)
New Message  Pt stated- [facility] his Omega 3 Rx. Please advise.

## 2015-04-04 NOTE — Telephone Encounter (Signed)
Form filled out and faxed  Advised patient waiting to hear back company

## 2015-04-05 ENCOUNTER — Telehealth: Payer: Self-pay | Admitting: Cardiology

## 2015-04-05 NOTE — Telephone Encounter (Signed)
New Message  Rep from [facility] calling to speak w/ RN concerning pt's Omega 3. Please call back and discuss.

## 2015-04-05 NOTE — Telephone Encounter (Signed)
Spoke with [facility] and they did receive PA request, Lovaza has bee approved  Advised patient

## 2015-04-05 NOTE — Telephone Encounter (Signed)
Spoke with [facility] and Lovaza has been approved, advised patient

## 2015-04-11 ENCOUNTER — Telehealth: Payer: Self-pay

## 2015-04-11 NOTE — Telephone Encounter (Signed)
Generic Lovaza approved by [facility].  Good through 04/04/2016. Case ID [id number].

## 2015-04-19 ENCOUNTER — Other Ambulatory Visit (INDEPENDENT_AMBULATORY_CARE_PROVIDER_SITE_OTHER): Payer: Medicare Other | Admitting: *Deleted

## 2015-04-19 DIAGNOSIS — E7800 Pure hypercholesterolemia, unspecified: Secondary | ICD-10-CM

## 2015-04-19 DIAGNOSIS — I119 Hypertensive heart disease without heart failure: Secondary | ICD-10-CM | POA: Diagnosis not present

## 2015-04-19 DIAGNOSIS — I10 Essential (primary) hypertension: Secondary | ICD-10-CM

## 2015-04-19 LAB — HEPATIC FUNCTION PANEL
ALK PHOS: 54 U/L (ref 40–115)
ALT: 20 U/L (ref 9–46)
AST: 20 U/L (ref 10–35)
Albumin: 3.9 g/dL (ref 3.6–5.1)
BILIRUBIN DIRECT: 0.1 mg/dL (ref <=0.2)
BILIRUBIN INDIRECT: 0.4 mg/dL (ref 0.2–1.2)
BILIRUBIN TOTAL: 0.5 mg/dL (ref 0.2–1.2)
Total Protein: 6.5 g/dL (ref 6.1–8.1)

## 2015-04-19 LAB — BASIC METABOLIC PANEL
BUN: 13 mg/dL (ref 7–25)
CHLORIDE: 100 mmol/L (ref 98–110)
CO2: 27 mmol/L (ref 20–31)
Calcium: 9 mg/dL (ref 8.6–10.3)
Creat: 1 mg/dL (ref 0.70–1.18)
GLUCOSE: 103 mg/dL — AB (ref 65–99)
POTASSIUM: 4.7 mmol/L (ref 3.5–5.3)
Sodium: 135 mmol/L (ref 135–146)

## 2015-04-19 LAB — LIPID PANEL
CHOLESTEROL: 216 mg/dL — AB (ref 125–200)
HDL: 53 mg/dL (ref >=40)
LDL Cholesterol: 151 mg/dL — ABNORMAL HIGH (ref <130)
Total CHOL/HDL Ratio: 4.1 Ratio (ref <=5.0)
Triglycerides: 60 mg/dL (ref <150)
VLDL: 12 mg/dL (ref <30)

## 2015-04-19 NOTE — Addendum Note (Signed)
Addended by: Eulis Foster on: 04/19/2015 07:46 AM   Modules accepted: Orders

## 2015-04-19 NOTE — Addendum Note (Signed)
Addended by: Eulis Foster on: 04/19/2015 07:45 AM   Modules accepted: Orders

## 2015-04-26 ENCOUNTER — Ambulatory Visit: Payer: Medicare Other | Admitting: Physician Assistant

## 2015-04-26 NOTE — Progress Notes (Signed)
Cardiology Office Note:    Date:  04/27/2015   ID:  Theodore [name], DOB 1934/09/06, MRN [id number]  PCP:  No PCP Per Patient Dr. Lynnae Sandhoff starting in June Cardiologist:  Dr. Darlin Coco >> Dr. Skeet Latch   Electrophysiologist:  n/a  Chief Complaint  Patient presents with  . Atrial Fibrillation    follow up  . Coronary Artery Disease    follow up    History of Present Illness:     Theodore [name] is a 81 y.o. male with a hx of PAF, HTN, HL, CAD, asthma, thrombocytopenia.  Patient was evaluated for chest pain with a Myoview in 7/16 that demonstrated inferior scar with peri-infarct ischemia. LHC in 7/16 demonstrated ostial/proximal LAD 50-70%, heavily calcified but widely patent LCx and RCA, EF 45-50%. There was no anterior ischemia on his nuclear study. PCI was not felt to be indicated. He was treated medically. Last seen by Dr. Mare Ferrari 11/16.  In February, he came off of his statin drug. He had a lot of myalgias. He was brought into the office for labs: Total CK 76, CRP <0.5, ESR 1.  Patient was concerned being off of statin therapy. Lovaza was recommended. Recent lipids 04/19/15: TC 216, HDL 53, LDL 151, TG 60. I asked him to consider Crestor.  But, he preferred to go back on Lipitor.  He opted to hold off on starting until seen in FU today.    Here for FU.  Myalgias are improved.  The patient denies chest pain, shortness of breath, syncope, orthopnea, PND or significant pedal edema.  He has occ episodes of AF.  Occur 1-2 times a year for about a week.     Past Medical History  Diagnosis Date  . Hypertension   . Hypercholesterolemia   . SOB (shortness of breath)   . Peripheral edema   . Nocturia   . Right bundle branch block   . PAF (paroxysmal atrial fibrillation) [facility]) January 2013    Placed on Pradaxa. Did not require cardioversion; spontaneously converted  . Tendonitis of elbow, left     Past Surgical History  Procedure Laterality Date  . Cardiac  catheterization  2009    NONOBSTRUCTIVE ATHERSCLEROTIC CORONARY DISEASE AND NORMAL  LV FUNCTION  . Back surgery  12/24/09  . Cardiac catheterization N/A 09/08/2014    Procedure: Left Heart Cath and Coronary Angiography;  Surgeon: Belva Crome, MD;  Location: [facility];  Service: Cardiovascular;  Laterality: N/A;    Current Medications: Outpatient Prescriptions Prior to Visit  Medication Sig Dispense Refill  . albuterol (PROVENTIL HFA;VENTOLIN HFA) 108 (90 BASE) MCG/ACT inhaler Inhale 2 puffs into the lungs every 6 (six) hours as needed for wheezing or shortness of breath. 1 Inhaler prn  . Ascorbic Acid (VITAMIN C PO) Take 1 tablet by mouth daily.     [name] diltiazem (CARTIA XT) 180 MG 24 hr capsule Take 1 capsule (180 mg total) by mouth daily. 90 capsule 2  . furosemide (LASIX) 20 MG tablet Take 1 tablet (20 mg total) by mouth daily. 90 tablet 3  . KLOR-CON M10 10 MEQ tablet Take 10 mEq by mouth 2 (two) times daily.    [name] losartan (COZAAR) 100 MG tablet Take 100 mg by mouth daily.    [name] LYSINE PO Take 1 tablet by mouth daily.     . metoprolol tartrate (LOPRESSOR) 50 MG tablet Take 1 tablet (50 mg total) by mouth 2 (two) times daily. 180 tablet 3  .  naproxen sodium (ANAPROX) 220 MG tablet Take 220 mg by mouth 2 (two) times daily with a meal.     . omeprazole (PRILOSEC) 20 MG capsule Take 20 mg by mouth daily.      . rivaroxaban (XARELTO) 20 MG TABS tablet Take 1 tablet (20 mg total) by mouth daily with supper. 90 tablet 1  . omega-3 acid ethyl esters (LOVAZA) 1 g capsule Take 2 capsules (2 g total) by mouth 2 (two) times daily. 360 capsule 1  . KLOR-CON M10 10 MEQ tablet TAKE 1 TABLET TWICE A DAY (NEW DOSE) 180 tablet 0   No facility-administered medications prior to visit.     Allergies:   Ace inhibitors; Amlodipine; Lipitor; Lopid; Niacin and related; Zocor; Penicillins; and Tamiflu   Social History   Social History  . Marital Status: Married    Spouse Name: N/A  . Number of  Children: N/A  . Years of Education: N/A   Occupational History  . retired     Art gallery manager   Social History Main Topics  . Smoking status: Never Smoker   . Smokeless tobacco: None  . Alcohol Use: No  . Drug Use: No  . Sexual Activity: Yes   Other Topics Concern  . None   Social History Narrative     Family History:  The patient's family history includes Asthma in his sister; COPD in his brother and father; Heart attack in his father; Heart disease in his brother and brother; Hypertension in his father. There is no history of Stroke.   ROS:   Please see the history of present illness.    Review of Systems  Musculoskeletal: Positive for myalgias.  All other systems reviewed and are negative.   Physical Exam:    VS:  BP 158/70 mmHg  Pulse 60  Ht 6' (1.829 m)  Wt 221 lb 6.4 oz (100.426 kg)  BMI 30.02 kg/m2   GEN: Well nourished, well developed, in no acute distress HEENT: normal Neck: no JVD, no masses Cardiac: Normal S1/S2, RRR; no murmurs, rubs, or gallops, no edema;  no carotid bruits,   Respiratory:  clear to auscultation bilaterally; no wheezing, rhonchi or rales GI: soft, nontender, nondistended MS: no deformity or atrophy Skin: warm and dry Neuro: No focal deficits  Psych: Alert and oriented x 3, normal affect  Wt Readings from Last 3 Encounters:  04/27/15 221 lb 6.4 oz (100.426 kg)  01/18/15 220 lb (99.791 kg)  12/21/14 219 lb 12.8 oz (99.701 kg)      Studies/Labs Reviewed:     EKG:  EKG is  ordered today.  The ekg ordered today demonstrates Sinus brady, HR 57, RBBB, NSSTTW changes, QTc 463 ms, 1st degree AV block, no sig changes  Recent Labs: 08/24/2014: TSH 1.43 04/19/2015: ALT 20; BUN 13; Creat 1.00; Potassium 4.7; Sodium 135 04/27/2015: Hemoglobin 13.3; Platelets 200   Estimated Creatinine Clearance: 74.7 mL/min (by C-G formula based on Cr of 1).    Recent Lipid Panel    Component Value Date/Time   CHOL 216* 04/19/2015 0746   TRIG 60  04/19/2015 0746   HDL 53 04/19/2015 0746   CHOLHDL 4.1 04/19/2015 0746   VLDL 12 04/19/2015 0746   LDLCALC 151* 04/19/2015 0746    Additional studies/ records that were reviewed today include:   Echo 8/16 EF 55-60%, no RWMA, Gr 1 DD, mild AI, mild MR, mild LAE, mild RVE, mild RAE, PASP 36 mmHg  LHC 7/16 LAD ostial 70%, prox 50% LCx  OM1 30% RCA prox 35% EF 45-50%  50-70% ostial/proximal LAD stenosis within the heavily calcified segment. Recent myocardial perfusion energy and within the past week did not demonstrate anterior ischemia.  Heavily calcified but widely patent circumflex and right coronary with luminal irregularities noted in the proximal mid and distal segments.  Mild global left ventricular dysfunction with an ejection fraction in the 45-50% range Recommendations:  The patient's symptoms are mixed. With exertion he experiences dyspnea. At rest and randomly he has neck jaw and left elbow discomfort. When both the angiogram and the myocardial perfusion study were considered together, no justification for percutaneous intervention on the LAD could be made.  Close clinical follow-up  Aggressive risk factor modification   Myoview 7/16 Overall Study Impression Myocardial perfusion is abnormal. There is a large defect of moderate severity present in the basal inferior, basal inferolateral, mid inferior, mid inferolateral, apical inferior and apex location. The defect is partially reversible. Findings consistent with prior myocardial infarction with peri-infarct ischemia. This is an intermediate risk study. Overall left ventricular systolic function was abnormal. Nuclear stress EF: 49%. The left ventricular ejection fraction is mildly decreased (45-54%). There is no prior study for comparison.   ASSESSMENT:     1. Coronary artery disease involving native coronary artery of native heart without angina pectoris   2. PAF (paroxysmal atrial fibrillation) ([place])   3. Essential  hypertension   4. Hyperlipidemia     PLAN:     In order of problems listed above:  1. CAD - No angina.  He is not on ASA as he is on Xarelto.  Will resume statin Rx with Crestor.  Continue beta-blocker.  Dr. Mare Ferrari recommended long term FU with Dr. Skeet Latch.  He can FU with Dr. [name] in 6 mos.  I am happy to see him here as well (every other visit).    2. PAF - In NSR today.  Continue Xarelto for anticoagulation.  No CBC done since 8/16.  Recent Creatinine normal.  If he has increasing episodes of AFib, could refer to AFib Clinic for further recommendations. I suppose a trial of Amiodarone first could also be considered before referring.  -  CBC today.  3. HTN - BP elevated today.  BPs run 130/80s at home. Recent Creatinine ok. Continue to monitor.   4. HL - Recent LDL 151.  With CAD, needs to be at least < 100 (prefer < 70).  Intol of Lipitor 80.  Intol of Zocor.  Discussed resuming Lipitor 40 vs Crestor 10.  He prefers Crestor.  -  Crestor 10 QD  -  Lipids and LFTs 3 mos.    Medication Adjustments/Labs and Tests Ordered: Current medicines are reviewed at length with the patient today.  Concerns regarding medicines are outlined above.  Medication changes, Labs and Tests ordered today are outlined in the Patient Instructions noted below. Patient Instructions  Medication Instructions:  1. STOP LOVAZA 2. START CRESTOR 10 MG DAILY ; RX SENT IN  Labwork: 1. TODAY CBC W/DIFF 2. FASTING LIPID AND LIVER PANEL TO BE DONE 07/27/15; LAB OPENS AT 7:30 AM  Testing/Procedures: NONE  Follow-Up: Your physician wants you to follow-up in: [place] [address]. [place].  You will receive a reminder letter in the mail two months in advance. If you don't receive a letter, please call our office to schedule the follow-up appointment.  Any Other Special Instructions Will Be Listed Below (If Applicable).  If you need a refill on your cardiac medications  before your next appointment, please  call yourpharmacy.     Signed, Richardson Dopp, PA-C  04/27/2015 5:17 PM    [facility] [address], [place], [place]  [address] Phone: [phone]; Fax: [phone]

## 2015-04-27 ENCOUNTER — Ambulatory Visit (INDEPENDENT_AMBULATORY_CARE_PROVIDER_SITE_OTHER): Payer: Medicare Other | Admitting: Physician Assistant

## 2015-04-27 ENCOUNTER — Encounter: Payer: Self-pay | Admitting: Physician Assistant

## 2015-04-27 ENCOUNTER — Ambulatory Visit: Payer: Medicare Other | Admitting: Nurse Practitioner

## 2015-04-27 VITALS — BP 158/70 | HR 60 | Ht 72.0 in | Wt 221.4 lb

## 2015-04-27 DIAGNOSIS — I10 Essential (primary) hypertension: Secondary | ICD-10-CM | POA: Diagnosis not present

## 2015-04-27 DIAGNOSIS — E785 Hyperlipidemia, unspecified: Secondary | ICD-10-CM

## 2015-04-27 DIAGNOSIS — I2510 Atherosclerotic heart disease of native coronary artery without angina pectoris: Secondary | ICD-10-CM | POA: Diagnosis not present

## 2015-04-27 DIAGNOSIS — I480 Paroxysmal atrial fibrillation: Secondary | ICD-10-CM

## 2015-04-27 LAB — CBC WITH DIFFERENTIAL/PLATELET
BASOS PCT: 0 % (ref 0–1)
Basophils Absolute: 0 10*3/uL (ref 0.0–0.1)
Eosinophils Absolute: 0.1 10*3/uL (ref 0.0–0.7)
Eosinophils Relative: 1 % (ref 0–5)
HCT: 40.2 % (ref 39.0–52.0)
HEMOGLOBIN: 13.3 g/dL (ref 13.0–17.0)
LYMPHS ABS: 2.2 10*3/uL (ref 0.7–4.0)
LYMPHS PCT: 32 % (ref 12–46)
MCH: 30.4 pg (ref 26.0–34.0)
MCHC: 33.1 g/dL (ref 30.0–36.0)
MCV: 92 fL (ref 78.0–100.0)
MONOS PCT: 12 % (ref 3–12)
MPV: 9.7 fL (ref 8.6–12.4)
Monocytes Absolute: 0.8 10*3/uL (ref 0.1–1.0)
NEUTROS ABS: 3.8 10*3/uL (ref 1.7–7.7)
NEUTROS PCT: 55 % (ref 43–77)
Platelets: 200 10*3/uL (ref 150–400)
RBC: 4.37 MIL/uL (ref 4.22–5.81)
RDW: 13.9 % (ref 11.5–15.5)
WBC: 6.9 10*3/uL (ref 4.0–10.5)

## 2015-04-27 MED ORDER — ROSUVASTATIN CALCIUM 10 MG PO TABS: 10.0000 mg | ORAL_TABLET | Freq: Every day | ORAL | Status: DC

## 2015-04-27 NOTE — Patient Instructions (Addendum)
Medication Instructions:  1. STOP LOVAZA 2. START CRESTOR 10 MG DAILY ; RX SENT IN  Labwork: 1. TODAY CBC W/DIFF 2. FASTING LIPID AND LIVER PANEL TO BE DONE 07/27/15; LAB OPENS AT 7:30 AM  Testing/Procedures: NONE  Follow-Up: Your physician wants you to follow-up in: [place] DR. [place].  You will receive a reminder letter in the mail two months in advance. If you don't receive a letter, please call our office to schedule the follow-up appointment.  Any Other Special Instructions Will Be Listed Below (If Applicable).  If you need a refill on your cardiac medications before your next appointment, please call yourpharmacy.

## 2015-04-30 ENCOUNTER — Telehealth: Payer: Self-pay | Admitting: *Deleted

## 2015-04-30 NOTE — Telephone Encounter (Signed)
Pt has been notified of lab results by phone with verbal understanding. 

## 2015-05-08 ENCOUNTER — Other Ambulatory Visit: Payer: Self-pay | Admitting: Cardiology

## 2015-05-28 ENCOUNTER — Encounter: Payer: Self-pay | Admitting: Internal Medicine

## 2015-05-28 ENCOUNTER — Ambulatory Visit (INDEPENDENT_AMBULATORY_CARE_PROVIDER_SITE_OTHER): Payer: Medicare Other | Admitting: Internal Medicine

## 2015-05-28 VITALS — BP 134/84 | HR 68 | Ht 72.0 in | Wt 222.0 lb

## 2015-05-28 DIAGNOSIS — J449 Chronic obstructive pulmonary disease, unspecified: Secondary | ICD-10-CM

## 2015-05-28 DIAGNOSIS — I480 Paroxysmal atrial fibrillation: Secondary | ICD-10-CM

## 2015-05-28 NOTE — Progress Notes (Signed)
Subjective:    Patient ID: Theodore [name], male    DOB: [birth year], 81 y.o.   MRN: [id number]  HPI 07/25/10- 75 yoM followed for bronchitis and shortness of breath, complicated by HBP, peripheral edema. Last here July 26, 2009- Reviewed PFT from that visit- FEV1/FVC 0.65. Had back surgery in Mid-November without respiratory problems. He is walking regularly. Advair really helps, used once daily. Denies acute change, cough or wheeze.  Still follows with [name].   08/19/11- 75 yoM never smoker followed for bronchitis and shortness of breath, complicated by HBP, peripheral edema. Slight SOB with weather as humid as it is; denies any cough, congestion, or wheezing. He had no problem with anesthesia for a triceps repair. Advair doing well. Denies need for rescue inhaler. Cannot skip omeprazole for control of GERD CXR 02/23/11- IMPRESSION:  No acute cardiopulmonary disease.  Original Report Authenticated By: [name], M.D.   6//24/14-77 yoM followed for bronchitis and shortness of breath, complicated by HBP, peripheral edema FOLLOWS FOR: still has hoarseness and short winded at times. Hoarseness occasionally clears.  Thinks GERD is controlled with omeprazole. Dyspnea exertion climbing stairs is unchanged. Prefers Advair over Symbicort-no wheezing.  07/28/13-78 yoM followed for bronchitis and shortness of breath, complicated by HBP, peripheral edema,  GERD FOLLOWS FOR: Have slight increased SOB-thinks it his Afib episode that is causing this-is being followed by Cardiology. Dyspnea mostly with exertion, otherwise breathing good except for the hot weather. No cough or wheeze. Not anemic when last checked in February, on Xarelto.  CXR 07/12/12 IMPRESSION:  Stable appearance of the chest. No evidence for active chest  disease.  Original Report Authenticated By: [name], M.D.  12/01/13- 53 yoM never smoker followed for bronchitis and shortness of breath, complicated by HBP,  peripheral edema,  GERD, PAFib FOLLOW FOR:  SOB, PFT results,  PFT 11/30/13- moderate obstruction, insignificant response to bronchodilator, airtrapping, minimal diffusion defect. FEV1 2.21/ 70%, FEV1/FVC 0.63, TLC 97%, DLCO 78%. Doing fine with wheeze/ cough, Does think more aware of DOE hills, stairs. Father smoked, died of emphysema. Brother smoked, bad COPD.  Paroxysmal AFib / [name]. Pt can feel when in AFib.  Pt asks about trying Advair again- made hoarse. Will try Anoro.   05/26/14- 79 yoM never smoker followed for COPD/ bronchitis, complicated by HBP, peripheral edema,  GERD, PAFib FOLLOWS FOR: Pt states he has occasional wheezing/SOB when walking up the stairs;otherwise doing well. Anoro had no effect so he stopped it. Rarely uses rescue inhaler. He recognizes dyspnea primarily corresponds to episodes of his paroxysmal atrial fib. Otherwise feels much better.  05/28/2015-81 year old male never smoker followed for COPD/bronchitis, Located by HBP, peripheral edema, GERD, P A. Fib Follows for: COPD, Chronic Bronchitis. Pt c/o some SOB with exertion, wheeze and minimal cough. Increased dyspnea on exertion with hills and stairs-he thinks it's probably mostly cardiac. Has had recurrent P A. fib. Seldom needs rescue inhaler. Anoro was no help. Inhaler does not trigger his atrial fib.  CXR 08/24/2014 IMPRESSION: COPD. There is no pneumonia, CHF, nor other acute cardiopulmonary abnormality. Electronically Signed  By: David Martinique M.D.  On: 08/24/2014 12:24-  ROS-see HPI Constitutional:   No-   weight loss, night sweats, fevers, chills, fatigue, lassitude. HEENT:   No-  headaches, difficulty swallowing, tooth/dental problems, sore throat,       No-  sneezing, itching, ear ache, nasal congestion, post nasal drip,  CV:  No-   chest pain, orthopnea, PND, swelling in lower extremities, anasarca, dizziness,  palpitations Resp: + shortness of breath with exertion or at rest.               No-   productive cough,  No non-productive cough,  No- coughing up of blood.              No-   change in color of mucus.  No- wheezing.   Skin: No-   rash or lesions. GI:  No-   heartburn, indigestion, abdominal pain, nausea, vomiting,  GU:  MS:  No-   joint pain or swelling.  Neuro-     nothing unusual Psych:  No- change in mood or affect. No depression or anxiety.  No memory loss.  OBJ- Physical Exam General- Alert, Oriented, Affect-appropriate, Distress- none acute, looks fit and trim Skin- +ecchymoses on arms  Lymphadenopathy- none Head- atraumatic            Eyes- Gross vision intact, PERRLA, conjunctivae and secretions clear            Ears- Hearing, canals-normal            Nose- Clear, no-Septal dev, mucus, polyps, erosion, perforation             Throat- Mallampati II , mucosa clear , drainage- none, tonsils- atrophic,  Neck- flexible , trachea midline, no stridor , thyroid nl, carotid no bruit Chest - symmetrical excursion , unlabored           Heart/CV- RR , no murmur , no gallop  , no rub, nl s1 s2                           - JVD- none , edema- none, stasis changes- none, varices- none           Lung- +  clear, wheeze- none, cough- none , dullness-none, rub- none           Chest wall-  Abd-  Br/ Gen/ Rectal- Not done, not indicated Extrem- cyanosis- none, clubbing, none, atrophy- none, strength- nl Neuro- grossly intact to observation

## 2015-05-28 NOTE — Patient Instructions (Addendum)
Sample Incruse Ellipta inhaler    Inhale 1 puff, once daily    See if this affects your shortness of breath  Please call as needed

## 2015-05-29 NOTE — Assessment & Plan Note (Signed)
I agreed that his dyspnea on exertion mostly reflects cardiac status and therapy. Plan-try sample Incruse Ellipta  to see if that helps. It shouldn't affect her heart rhythm. Consider updating spirometry and oxygen evaluations in the future.

## 2015-05-29 NOTE — Assessment & Plan Note (Signed)
Rhythm feels like regular sinus on exam at this visit

## 2015-06-04 ENCOUNTER — Telehealth: Payer: Self-pay | Admitting: Internal Medicine

## 2015-06-04 MED ORDER — TIOTROPIUM BROMIDE MONOHYDRATE 1.25 MCG/ACT IN AERS: 2.0000 | INHALATION_SPRAY | Freq: Every day | RESPIRATORY_TRACT | Status: DC

## 2015-06-04 NOTE — Telephone Encounter (Signed)
Spoke with pt and advised of CY's recommendations. Pt agreed. Rx sent. Nothing further needed.

## 2015-06-04 NOTE — Telephone Encounter (Signed)
Spoke with pt and he states that Incruse is not covered by his ins co. Pt states he called his insurance co and was told that these would be covered: Spiriva Respimat Tudorza Pressair  Pt would like to know which one CY recommends and wants to make sure that it will not interfere with his A-Fib. Pt would like the alternative sent to [facility] for 90D.   CY please advise.   Allergies  Allergen Reactions  . Ace Inhibitors     Unknown reaction per pt   . Amlodipine     Peripheral edema  . Lipitor [Atorvastatin]     MYALGIA   . Lopid [Gemfibrozil]     Unknown reaction, per pt   . Niacin And Related     Unknown reaction, per pt   . Zocor [Simvastatin]     Unknown reaction, per pt   . Penicillins Rash    "Blistering rash"  . Tamiflu Rash    Rash,itching   Current Outpatient Prescriptions on File Prior to Visit  Medication Sig Dispense Refill  . albuterol (PROVENTIL HFA;VENTOLIN HFA) 108 (90 BASE) MCG/ACT inhaler Inhale 2 puffs into the lungs every 6 (six) hours as needed for wheezing or shortness of breath. 1 Inhaler prn  . Ascorbic Acid (VITAMIN C PO) Take 1 tablet by mouth daily.     [name] diltiazem (CARTIA XT) 180 MG 24 hr capsule Take 1 capsule (180 mg total) by mouth daily. 90 capsule 2  . furosemide (LASIX) 20 MG tablet Take 1 tablet (20 mg total) by mouth daily. 90 tablet 3  . KLOR-CON M10 10 MEQ tablet TAKE 1 TABLET TWICE A DAY (NEW DOSE) 180 tablet 1  . losartan (COZAAR) 100 MG tablet Take 100 mg by mouth daily.    [name] LYSINE PO Take 1 tablet by mouth daily.     . metoprolol tartrate (LOPRESSOR) 50 MG tablet Take 1 tablet (50 mg total) by mouth 2 (two) times daily. 180 tablet 3  . naproxen sodium (ANAPROX) 220 MG tablet Take 220 mg by mouth 2 (two) times daily with a meal.     . omeprazole (PRILOSEC) 20 MG capsule Take 20 mg by mouth daily.      . rivaroxaban (XARELTO) 20 MG TABS tablet Take 1 tablet (20 mg total) by mouth daily with supper. 90 tablet 1  . rosuvastatin  (CRESTOR) 10 MG tablet Take 1 tablet (10 mg total) by mouth daily. 30 tablet 11   No current facility-administered medications on file prior to visit.

## 2015-06-04 NOTE — Telephone Encounter (Signed)
Offer Spiriva Respimat, # 3,    Inhale 2 puffs, once daily     Ref x 3

## 2015-06-05 ENCOUNTER — Other Ambulatory Visit: Payer: Self-pay | Admitting: *Deleted

## 2015-06-05 MED ORDER — DILTIAZEM HCL ER COATED BEADS 180 MG PO CP24: 180.0000 mg | ORAL_CAPSULE | Freq: Every day | ORAL | Status: DC

## 2015-06-06 ENCOUNTER — Other Ambulatory Visit: Payer: Self-pay

## 2015-06-06 MED ORDER — LOSARTAN POTASSIUM 100 MG PO TABS: 100.0000 mg | ORAL_TABLET | Freq: Every day | ORAL | Status: DC

## 2015-07-11 ENCOUNTER — Telehealth: Payer: Self-pay | Admitting: Physician Assistant

## 2015-07-11 ENCOUNTER — Other Ambulatory Visit (INDEPENDENT_AMBULATORY_CARE_PROVIDER_SITE_OTHER): Payer: Medicare Other | Admitting: *Deleted

## 2015-07-11 DIAGNOSIS — E785 Hyperlipidemia, unspecified: Secondary | ICD-10-CM

## 2015-07-11 LAB — LIPID PANEL
Cholesterol: 144 mg/dL (ref 125–200)
HDL: 57 mg/dL (ref >=40)
LDL CALC: 70 mg/dL (ref <130)
Total CHOL/HDL Ratio: 2.5 Ratio (ref <=5.0)
Triglycerides: 84 mg/dL (ref <150)
VLDL: 17 mg/dL (ref <30)

## 2015-07-11 LAB — HEPATIC FUNCTION PANEL
ALK PHOS: 56 U/L (ref 40–115)
ALT: 15 U/L (ref 9–46)
AST: 17 U/L (ref 10–35)
Albumin: 4.2 g/dL (ref 3.6–5.1)
BILIRUBIN INDIRECT: 0.6 mg/dL (ref 0.2–1.2)
BILIRUBIN TOTAL: 0.8 mg/dL (ref 0.2–1.2)
Bilirubin, Direct: 0.2 mg/dL (ref <=0.2)
Total Protein: 6.5 g/dL (ref 6.1–8.1)

## 2015-07-11 NOTE — Telephone Encounter (Signed)
Called pt advised him to come in today and get FLP/LFT early and I will cancel 6/16 lab appt. Pt stated he is having extreme fatigue, muscle and leg weakness. Pt is on Crestor 10 mg at this time and has failed other statins in the past.

## 2015-07-11 NOTE — Telephone Encounter (Signed)
New Message:  Pt called in stating that he has been taking Crestor for the past 10 wks and is having some issues with extreme fatigue and muscle and leg weakness. He would like to know if he can come in this morning and take his labs as oppose to waiting until 6/16. Please f/u with him because he also stated that he has fasted.

## 2015-07-12 ENCOUNTER — Telehealth: Payer: Self-pay | Admitting: *Deleted

## 2015-07-12 ENCOUNTER — Telehealth: Payer: Self-pay | Admitting: Internal Medicine

## 2015-07-12 NOTE — Telephone Encounter (Signed)
Spoke with pt. He has a question for CY about Spiriva Respimat. Pt is wanting to know if increased fatigue could be a possible side effect. Since starting the medication this has been an issue for him.  CY - please advise. Thanks.

## 2015-07-12 NOTE — Telephone Encounter (Signed)
Spoke with pt and gave CY's recommendations. Pt voiced understanding and will contact us if breathing worsens. Also advised pt that if fatigue does not improve he should contact his PCP. Nothing further needed.

## 2015-07-12 NOTE — Telephone Encounter (Signed)
Suggest stopping Spiriva for a week. See if the feeling goes away and if breathing is ok w/o Spiriva

## 2015-07-12 NOTE — Telephone Encounter (Signed)
Pt has been notified of lab resullts by phone with verbal understanding. Pt still c/o myalgia's and fatigue which he feels is coming from the Crestor. Advised I will d/w Brynda Rim. PA to see if possibly try decreasing crestor to 5 mg w/labs in 2-3 months. Pt said thank you. He also asked for me to please ask Nicki Reaper if his Spiriva could be causing his fatigue and myalgia's. I advised I did not think the Spiriva would be causing his symptoms; though I will d/w PA as to his thoughts on this well. Pt said thank you for my help.

## 2015-07-13 ENCOUNTER — Telehealth: Payer: Self-pay | Admitting: *Deleted

## 2015-07-13 DIAGNOSIS — E7800 Pure hypercholesterolemia, unspecified: Secondary | ICD-10-CM

## 2015-07-13 NOTE — Telephone Encounter (Signed)
S/w pt in regards to Crestor, myalgia's, fatigue. Pt advised per Brynda Rim. PA recommendation to Lipid Clinic to discuss [facility] options. Pt states he did also s/w Dr. Annamaria Boots the other day in regards to the [place] who advised pt to hold Spirivia x 1 week. I will place referral in for Lipid Clinic and have [facility] call pt on Monday with an appt. Pt said thank you and thank you for caring.

## 2015-07-24 ENCOUNTER — Ambulatory Visit (INDEPENDENT_AMBULATORY_CARE_PROVIDER_SITE_OTHER): Payer: Medicare Other | Admitting: Pharmacist

## 2015-07-24 ENCOUNTER — Ambulatory Visit: Payer: Medicare Other | Admitting: Pharmacist

## 2015-07-24 DIAGNOSIS — E785 Hyperlipidemia, unspecified: Secondary | ICD-10-CM

## 2015-07-24 MED ORDER — ATORVASTATIN CALCIUM 40 MG PO TABS: 40.0000 mg | ORAL_TABLET | Freq: Every day | ORAL | Status: DC

## 2015-07-24 MED ORDER — EZETIMIBE 10 MG PO TABS: 10.0000 mg | ORAL_TABLET | Freq: Every day | ORAL | Status: DC

## 2015-07-24 NOTE — Patient Instructions (Signed)
Stop taking your Crestor 10mg  daily. Give yourself a week off of statins to see if your energy level improves.  I sent in prescriptions for Lipitor (atorvastatin) 40mg  daily to restart after a week. I also sent in Zetia (ezetimibe) 10mg  to start taking once daily. The combination of Lipitor and Zetia should bring your LDL < 70.  We will recheck your cholesterol in August when you see Dr. [name] at [facility].

## 2015-07-24 NOTE — Progress Notes (Signed)
Patient ID: Theodore [name]                 DOB: [birth year]                    MRN: [id number]     HPI: Theodore [name] is a very pleasant 81 y.o. male patient referred to lipid clinic by Theodore Dopp, PA. PMH is significant for CAD, HLD, PAF, HTN, asthma, and thrombocytopenia. LHC in 7/16 showed ostial/proximal LAD 50-70%, heavily calcified but widely patent LCx and RCA, EF 45-50% and pt was treated medically. Pt has a history of statin intolerance and presents to lipid clinic for further management of his cholesterol.  Patient brings in a detailed log of his statin use (with doses and side effects) over the past 25 years as well as a graph trending his TC, HDL, and LDL over that time period. In the early 90s, pt experienced LFT elevations with lovastatin 10mg , simvastatin, and lovastatin 20mg . He was then started on atorvastatin 10mg  daily. Over time this was increased to 20mg  and then 40mg  daily. He tolerated atorvastatin 40mg  daily for more than 10 years. In July 2016, pt had a heart cath and his atorvastatin dose was increased to 80mg  d/t 50-70% blockage in his LAD. Pt reports that he experienced myalgias immediately after the dose increase. He was then switched to Crestor 10mg  and has been taking this for the past 3 months. He became very fatigued and SOB when he started the Crestor and reports being much less active over the past few months which has led to some weight gain. There was a question over whether his Spiriva was potentially causing his SOB. He stopped his Spiriva for a week and did not notice any improvement.  Current Medications: Crestor 10mg  daily - still taking but has significant fatigue Intolerances: lovastatin 10mg  and 20mg , simvastatin - elevated LFTs; Lipitor 80mg  - myalgias; Crestor 10mg - fatigue, muscle and leg weakness. Niacin and fenofibrate - pt has tried in the past but does not recall specific side effect. Risk Factors: CAD, age LDL goal: < 70mg /dL  Diet: Avoids fried  food  Exercise: Used to walk 1-2 miles per day and play golf but has become inactive in the past few months d/t fatigue likely associated with Crestor  Family History: Father with heart attack and HTN, brother with heart disease  Social History: Patient reports that he has never smoked cigarettes, does not drink alcohol, and does not use illicit drugs.  Labs: 07/11/15: TC 144, TG 84, HDL 57, LDL 70 (Crestor 10mg  daily)  Past Medical History  Diagnosis Date  . Hypertension   . Hypercholesterolemia   . SOB (shortness of breath)   . Peripheral edema   . Nocturia   . Right bundle branch block   . PAF (paroxysmal atrial fibrillation) [facility]) January 2013    Placed on Pradaxa. Did not require cardioversion; spontaneously converted  . Tendonitis of elbow, left     Current Outpatient Prescriptions on File Prior to Visit  Medication Sig Dispense Refill  . albuterol (PROVENTIL HFA;VENTOLIN HFA) 108 (90 BASE) MCG/ACT inhaler Inhale 2 puffs into the lungs every 6 (six) hours as needed for wheezing or shortness of breath. 1 Inhaler prn  . Ascorbic Acid (VITAMIN C PO) Take 1 tablet by mouth daily.     [name] diltiazem (CARTIA XT) 180 MG 24 hr capsule Take 1 capsule (180 mg total) by mouth daily. 90 capsule 1  . furosemide (LASIX) 20  MG tablet Take 1 tablet (20 mg total) by mouth daily. 90 tablet 3  . KLOR-CON M10 10 MEQ tablet TAKE 1 TABLET TWICE A DAY (NEW DOSE) 180 tablet 1  . losartan (COZAAR) 100 MG tablet Take 1 tablet (100 mg total) by mouth daily. 90 tablet 1  . LYSINE PO Take 1 tablet by mouth daily.     . metoprolol tartrate (LOPRESSOR) 50 MG tablet Take 1 tablet (50 mg total) by mouth 2 (two) times daily. 180 tablet 3  . naproxen sodium (ANAPROX) 220 MG tablet Take 220 mg by mouth 2 (two) times daily with a meal.     . omeprazole (PRILOSEC) 20 MG capsule Take 20 mg by mouth daily.      . rivaroxaban (XARELTO) 20 MG TABS tablet Take 1 tablet (20 mg total) by mouth daily with supper. 90 tablet  1  . rosuvastatin (CRESTOR) 10 MG tablet Take 1 tablet (10 mg total) by mouth daily. 30 tablet 11  . Tiotropium Bromide Monohydrate (SPIRIVA RESPIMAT) 1.25 MCG/ACT AERS Inhale 2 puffs into the lungs daily. 3 Inhaler 3   No current facility-administered medications on file prior to visit.    Allergies  Allergen Reactions  . Ace Inhibitors     Unknown reaction per pt   . Amlodipine     Peripheral edema  . Lipitor [Atorvastatin]     MYALGIA   . Lopid [Gemfibrozil]     Unknown reaction, per pt   . Niacin And Related     Unknown reaction, per pt   . Zocor [Simvastatin]     Unknown reaction, per pt   . Penicillins Rash    "Blistering rash"  . Tamiflu Rash    Rash,itching    Assessment/Plan:  1. Hyperlipidemia - LDL most recently 70 at goal on Crestor 10mg  daily however pt experiencing significant fatigue on Crestor. Has previously tried and failed lovastatin, simvastatin, and high dose atorvastatin. Pt tolerated atorvastatin 40mg  well for 10 years and only experienced myalgias when dose increased to 80mg . Will have pt d/c Crestor and have a 1 week wash out period. He will look for symptom improvement. Rx sent in for atorvastatin 40mg  which pt will start in a week. In the past, LDL has been 80-90s on this dose. Will also add Zetia 10mg  daily which should bring LDL to goal with improved tolerability. Pt seeing Dr. Oval [name] in 2-3 months, will have pt recheck lipid panel at that time.   Theodore [name], PharmD, [place] [id number], [place], [place] [address] Phone: [phone]; Fax: [phone] 07/24/2015 8:31 AM

## 2015-07-27 ENCOUNTER — Other Ambulatory Visit: Payer: Medicare Other

## 2015-08-07 ENCOUNTER — Ambulatory Visit (INDEPENDENT_AMBULATORY_CARE_PROVIDER_SITE_OTHER): Payer: Medicare Other

## 2015-08-07 ENCOUNTER — Encounter: Payer: Self-pay | Admitting: Family Medicine

## 2015-08-07 ENCOUNTER — Ambulatory Visit (INDEPENDENT_AMBULATORY_CARE_PROVIDER_SITE_OTHER): Payer: Medicare Other | Admitting: Family Medicine

## 2015-08-07 VITALS — BP 128/78 | HR 65 | Temp 98.4°F | Ht 70.0 in | Wt 226.0 lb

## 2015-08-07 DIAGNOSIS — E785 Hyperlipidemia, unspecified: Secondary | ICD-10-CM

## 2015-08-07 DIAGNOSIS — R5382 Chronic fatigue, unspecified: Secondary | ICD-10-CM

## 2015-08-07 DIAGNOSIS — R5383 Other fatigue: Secondary | ICD-10-CM | POA: Insufficient documentation

## 2015-08-07 DIAGNOSIS — Z0000 Encounter for general adult medical examination without abnormal findings: Secondary | ICD-10-CM | POA: Diagnosis not present

## 2015-08-07 DIAGNOSIS — J449 Chronic obstructive pulmonary disease, unspecified: Secondary | ICD-10-CM | POA: Diagnosis not present

## 2015-08-07 DIAGNOSIS — R0609 Other forms of dyspnea: Secondary | ICD-10-CM

## 2015-08-07 DIAGNOSIS — I480 Paroxysmal atrial fibrillation: Secondary | ICD-10-CM

## 2015-08-07 DIAGNOSIS — I10 Essential (primary) hypertension: Secondary | ICD-10-CM

## 2015-08-07 DIAGNOSIS — K219 Gastro-esophageal reflux disease without esophagitis: Secondary | ICD-10-CM

## 2015-08-07 LAB — CBC WITH DIFFERENTIAL/PLATELET
BASOS ABS: 0 10*3/uL (ref 0.0–0.1)
Basophils Relative: 0.2 % (ref 0.0–3.0)
EOS ABS: 0.1 10*3/uL (ref 0.0–0.7)
Eosinophils Relative: 1.1 % (ref 0.0–5.0)
HCT: 36.8 % — ABNORMAL LOW (ref 39.0–52.0)
Hemoglobin: 11.9 g/dL — ABNORMAL LOW (ref 13.0–17.0)
LYMPHS ABS: 2.3 10*3/uL (ref 0.7–4.0)
Lymphocytes Relative: 35.8 % (ref 12.0–46.0)
MCHC: 32.3 g/dL (ref 30.0–36.0)
MCV: 84 fl (ref 78.0–100.0)
MONOS PCT: 10.2 % (ref 3.0–12.0)
Monocytes Absolute: 0.7 10*3/uL (ref 0.1–1.0)
NEUTROS ABS: 3.4 10*3/uL (ref 1.4–7.7)
NEUTROS PCT: 52.7 % (ref 43.0–77.0)
PLATELETS: 183 10*3/uL (ref 150.0–400.0)
RBC: 4.39 Mil/uL (ref 4.22–5.81)
RDW: 17.2 % — ABNORMAL HIGH (ref 11.5–15.5)
WBC: 6.5 10*3/uL (ref 4.0–10.5)

## 2015-08-07 LAB — VITAMIN B12: VITAMIN B 12: 742 pg/mL (ref 211–911)

## 2015-08-07 LAB — BASIC METABOLIC PANEL
BUN: 21 mg/dL (ref 6–23)
CHLORIDE: 102 meq/L (ref 96–112)
CO2: 29 mEq/L (ref 19–32)
Calcium: 9.3 mg/dL (ref 8.4–10.5)
Creatinine, Ser: 1.27 mg/dL (ref 0.40–1.50)
GFR: 58.16 mL/min — AB (ref >60.00)
Glucose, Bld: 107 mg/dL — ABNORMAL HIGH (ref 70–99)
POTASSIUM: 4.3 meq/L (ref 3.5–5.1)
SODIUM: 135 meq/L (ref 135–145)

## 2015-08-07 LAB — BRAIN NATRIURETIC PEPTIDE: PRO B NATRI PEPTIDE: 232 pg/mL — AB (ref 0.0–100.0)

## 2015-08-07 LAB — TSH: TSH: 1.64 u[IU]/mL (ref 0.35–4.50)

## 2015-08-07 NOTE — Progress Notes (Signed)
BP 128/78 mmHg  Pulse 65  Temp(Src) 98.4 F (36.9 C) (Oral)  Ht 5\' 10"  (1.778 m)  Wt 226 lb (102.513 kg)  BMI 32.43 kg/m2  SpO2 95%   CC: new pt to establish care  Subjective:    Patient ID: Theodore [name], male    DOB: 08-01-1934, 81 y.o.   MRN: [id number]  HPI: Theodore [name] is a 81 y.o. male presenting on 08/07/2015 for Establish Care   Wife sees Dr Diona Browner.   Annia Belt earlier today for initial medicare wellness visit, note reviewed. Presents to establish care.   Followed by [name] now Dr [name] for cardiovascular disease. Latest saw lipid clinic - started atorvastatin 40mg  daily + zetia 10mg  daily. Also sees Dr Annamaria Boots (once yearly) for COPD with chronic bronchitis. Compliant with spiriva. Never smoker. Known parox atrial fibrillation.   HTN - Compliant with current antihypertensive regimen of lasix 20mg  daily, Kdur 69mEq BID, losartan 100mg  daily, and metoprolol 50mg  bid. Does check blood pressures at home: well controlle. No low blood pressure readings or symptoms of dizziness/syncope. Denies HA, vision changes, CP/tightness, SOB, leg swelling. Noticing nocturia.   H/o rheumatic fever with residual murmur.   Staying fatigued over last few years. Significant exertional dyspnea with leg heaviness and calf pain after walking 30 ft. Weight gain noted as well over the past 6 months (8lbs). Requests labs today.  Reviewed normal LE arterial doppler results.   Lives with wife, no pets Retired Occ: Art gallery manager Edu: master's Activity: golf Diet: good water, fruits/vegetables daily  Relevant past medical, surgical, family and social history reviewed and updated as indicated. Interim medical history since our last visit reviewed. Allergies and medications reviewed and updated. Current Outpatient Prescriptions on File Prior to Visit  Medication Sig  . albuterol (PROVENTIL HFA;VENTOLIN HFA) 108 (90 BASE) MCG/ACT inhaler Inhale 2 puffs into the lungs every 6 (six)  hours as needed for wheezing or shortness of breath.  . Ascorbic Acid (VITAMIN C PO) Take 1 tablet by mouth daily.   [name] atorvastatin (LIPITOR) 40 MG tablet Take 1 tablet (40 mg total) by mouth daily.  [name] diltiazem (CARTIA XT) 180 MG 24 hr capsule Take 1 capsule (180 mg total) by mouth daily.  [name] ezetimibe (ZETIA) 10 MG tablet Take 1 tablet (10 mg total) by mouth daily.  . furosemide (LASIX) 20 MG tablet Take 1 tablet (20 mg total) by mouth daily.  [name] KLOR-CON M10 10 MEQ tablet TAKE 1 TABLET TWICE A DAY (NEW DOSE)  . losartan (COZAAR) 100 MG tablet Take 1 tablet (100 mg total) by mouth daily.  [name] LYSINE PO Take 1 tablet by mouth daily.   . metoprolol tartrate (LOPRESSOR) 50 MG tablet Take 1 tablet (50 mg total) by mouth 2 (two) times daily.  . naproxen sodium (ANAPROX) 220 MG tablet Take 220 mg by mouth daily.   [name] omeprazole (PRILOSEC) 20 MG capsule Take 20 mg by mouth daily.    . rivaroxaban (XARELTO) 20 MG TABS tablet Take 1 tablet (20 mg total) by mouth daily with supper.  . Tiotropium Bromide Monohydrate (SPIRIVA RESPIMAT) 1.25 MCG/ACT AERS Inhale 2 puffs into the lungs daily.   No current facility-administered medications on file prior to visit.    Review of Systems Per HPI unless specifically indicated in ROS section     Objective:    BP 128/78 mmHg  Pulse 65  Temp(Src) 98.4 F (36.9 C) (Oral)  Ht 5\' 10"  (1.778 m)  Wt 226  lb (102.513 kg)  BMI 32.43 kg/m2  SpO2 95%  Wt Readings from Last 3 Encounters:  08/07/15 226 lb (102.513 kg)  08/07/15 226 lb (102.513 kg)  05/28/15 222 lb (100.699 kg)    Physical Exam  Constitutional: He appears well-developed and well-nourished. No distress.  HENT:  Mouth/Throat: Oropharynx is clear and moist. No oropharyngeal exudate.  Eyes: Conjunctivae are normal. Pupils are equal, round, and reactive to light.  Neck: Normal range of motion. Neck supple. Carotid bruit is not present. No thyromegaly present.  Cardiovascular: Normal rate, regular  rhythm, normal heart sounds and intact distal pulses.   No murmur heard. Pulmonary/Chest: Effort normal and breath sounds normal. No respiratory distress. He has no wheezes. He has no rales.  Musculoskeletal: He exhibits no edema.  1+ DP on right, diminished pulses on left  Lymphadenopathy:    He has no cervical adenopathy.  Skin: Skin is warm and dry. No rash noted.  Psychiatric: He has a normal mood and affect.  Nursing note and vitals reviewed.  Results for orders placed or performed in visit on 07/11/15  Lipid Profile  Result Value Ref Range   Cholesterol 144 125 - 200 mg/dL   Triglycerides 84 <150 mg/dL   HDL 57 >=40 mg/dL   Total CHOL/HDL Ratio 2.5 <=5.0 Ratio   VLDL 17 <30 mg/dL   LDL Cholesterol 70 <130 mg/dL  Hepatic function panel  Result Value Ref Range   Total Bilirubin 0.8 0.2 - 1.2 mg/dL   Bilirubin, Direct 0.2 <=0.2 mg/dL   Indirect Bilirubin 0.6 0.2 - 1.2 mg/dL   Alkaline Phosphatase 56 40 - 115 U/L   AST 17 10 - 35 U/L   ALT 15 9 - 46 U/L   Total Protein 6.5 6.1 - 8.1 g/dL   Albumin 4.2 3.6 - 5.1 g/dL   Lab Results  Component Value Date   WBC 6.9 04/27/2015   HGB 13.3 04/27/2015   HCT 40.2 04/27/2015   MCV 92.0 04/27/2015   PLT 200 04/27/2015    Lab Results  Component Value Date   TSH 1.43 08/24/2014    Lab Results  Component Value Date   CREATININE 1.00 04/19/2015       Assessment & Plan:   Problem List Items Addressed This Visit    Hyperlipemia    Chronic. Continue lipitor and zetia. Appreciate lipid clinic assistance.       Essential hypertension    Chronic, stable. Continue current regimen.       Relevant Orders   Basic metabolic panel   COPD with chronic bronchitis ([place])    Followed by pulmonary. Currently only on spiriva and PRN albuterol.       Dyspnea on exertion    Ongoing trouble. Check labs for reversible causes of dyspnea/fatigue. If persists, aware to f/u sooner with cardiology for further evaluation.  He has had lower  extremity arterial circulation within the past year (05/2014).       Relevant Orders   Brain natriuretic peptide   Paroxysmal atrial fibrillation (H[facility])    Continue dilt, xarelto, metoprolol.       GERD (gastroesophageal reflux disease)    Continue omeprazole 20mg  daily. Discussed backing off daily aleve.       Chronic fatigue - Primary    As above, check for reversible causes of fatigue. F/u with cardiology.       Relevant Orders   TSH   CBC with Differential/Platelet   Vitamin 123456   Basic metabolic panel  Follow up plan: Return in about 6 months (around 02/06/2016), or as needed, for annual exam, prior fasting for blood work.  [name], MD

## 2015-08-07 NOTE — Patient Instructions (Addendum)
Labwork today. If unrevealing cause of shortness of breath, schedule sooner appointment with Dr [name] cardiologist  Good to meet you today, call us with questions. Return as needed or in 6 months for physical.  Come in sooner if needed.

## 2015-08-07 NOTE — Patient Instructions (Addendum)
Theodore [name] , Thank you for taking time to come for your Medicare Wellness Visit. I appreciate your ongoing commitment to your health goals. Please review the following plan we discussed and let me know if I can assist you in the future.   These are the goals we discussed: Goals    . Increase water intake     Starting 08/07/2015, I will attempt to drink at least 6-8 glasses of water daily.        This is a list of the screening recommended for you and due dates:  Health Maintenance  Topic Date Due  . Tetanus Vaccine  08/06/2016  . Flu Shot  09/11/2015  . DTaP/Tdap/Td vaccine  Completed  . Shingles Vaccine  Completed  . Pneumonia vaccines  Completed    Preventive Care for Adults  A healthy lifestyle and preventive care can promote health and wellness. Preventive health guidelines for adults include the following key practices.  . A routine yearly physical is a good way to check with your health care provider about your health and preventive screening. It is a chance to share any concerns and updates on your health and to receive a thorough exam.  . Visit your dentist for a routine exam and preventive care every 6 months. Brush your teeth twice a day and floss once a day. Good oral hygiene prevents tooth decay and gum disease.  . The frequency of eye exams is based on your age, health, family medical history, use  of contact lenses, and other factors. Follow your health care provider's ecommendations for frequency of eye exams.  . Eat a healthy diet. Foods like vegetables, fruits, whole grains, low-fat dairy products, and lean protein foods contain the nutrients you need without too many calories. Decrease your intake of foods high in solid fats, added sugars, and salt. Eat the right amount of calories for you. Get information about a proper diet from your health care provider, if necessary.  . Regular physical exercise is one of the most important things you can do for your health.  Most adults should get at least 150 minutes of moderate-intensity exercise (any activity that increases your heart rate and causes you to sweat) each week. In addition, most adults need muscle-strengthening exercises on 2 or more days a week.  Silver Sneakers may be a benefit available to you. To determine eligibility, you may visit the website: www.silversneakers.com or contact program at 786-258-7876 Mon-Fri between 8AM-8PM.   . Maintain a healthy weight. The body mass index (BMI) is a screening tool to identify possible weight problems. It provides an estimate of body fat based on height and weight. Your health care provider can find your BMI and can help you achieve or maintain a healthy weight.   For adults 20 years and older: ? A BMI below 18.5 is considered underweight. ? A BMI of 18.5 to 24.9 is normal. ? A BMI of 25 to 29.9 is considered overweight. ? A BMI of 30 and above is considered obese.   . Maintain normal blood lipids and cholesterol levels by exercising and minimizing your intake of saturated fat. Eat a balanced diet with plenty of fruit and vegetables. Blood tests for lipids and cholesterol should begin at age 42 and be repeated every 5 years. If your lipid or cholesterol levels are high, you are over 50, or you are at high risk for heart disease, you may need your cholesterol levels checked more frequently. Ongoing high lipid and cholesterol levels  should be treated with medicines if diet and exercise are not working.  . If you smoke, find out from your health care provider how to quit. If you do not use tobacco, please do not start.  . If you choose to drink alcohol, please do not consume more than 2 drinks per day. One drink is considered to be 12 ounces (355 mL) of beer, 5 ounces (148 mL) of wine, or 1.5 ounces (44 mL) of liquor.  . If you are 60-75 years old, ask your health care provider if you should take aspirin to prevent strokes.  . Use sunscreen. Apply sunscreen  liberally and repeatedly throughout the day. You should seek shade when your shadow is shorter than you. Protect yourself by wearing long sleeves, pants, a wide-brimmed hat, and sunglasses year round, whenever you are outdoors.  . Once a month, do a whole body skin exam, using a mirror to look at the skin on your back. Tell your health care provider of new moles, moles that have irregular borders, moles that are larger than a pencil eraser, or moles that have changed in shape or color.

## 2015-08-07 NOTE — Assessment & Plan Note (Signed)
Chronic. Continue lipitor and zetia. Appreciate lipid clinic assistance.

## 2015-08-07 NOTE — Progress Notes (Signed)
Subjective:   Theodore [name] is a 81 y.o. male who presents for an Initial Medicare Annual Wellness Visit.  Review of Systems  N/A Cardiac Risk Factors include: advanced age (>3men, >40 women);male gender;hypertension;dyslipidemia;obesity (BMI >30kg/m2)    Objective:    Today's Vitals   08/07/15 1405  BP: 128/78  Pulse: 65  Temp: 98.4 F (36.9 C)  TempSrc: Oral  Height: 5\' 10"  (1.778 m)  Weight: 226 lb (102.513 kg)  SpO2: 95%  PainSc: 0-No pain   Body mass index is 32.43 kg/(m^2).  Current Medications (verified) Outpatient Encounter Prescriptions as of 08/07/2015  Medication Sig  . albuterol (PROVENTIL HFA;VENTOLIN HFA) 108 (90 BASE) MCG/ACT inhaler Inhale 2 puffs into the lungs every 6 (six) hours as needed for wheezing or shortness of breath.  . Ascorbic Acid (VITAMIN C PO) Take 1 tablet by mouth daily.   [name] atorvastatin (LIPITOR) 40 MG tablet Take 1 tablet (40 mg total) by mouth daily.  [name] diltiazem (CARTIA XT) 180 MG 24 hr capsule Take 1 capsule (180 mg total) by mouth daily.  [name] ezetimibe (ZETIA) 10 MG tablet Take 1 tablet (10 mg total) by mouth daily.  . furosemide (LASIX) 20 MG tablet Take 1 tablet (20 mg total) by mouth daily.  [name] KLOR-CON M10 10 MEQ tablet TAKE 1 TABLET TWICE A DAY (NEW DOSE)  . losartan (COZAAR) 100 MG tablet Take 1 tablet (100 mg total) by mouth daily.  [name] LYSINE PO Take 1 tablet by mouth daily.   . metoprolol tartrate (LOPRESSOR) 50 MG tablet Take 1 tablet (50 mg total) by mouth 2 (two) times daily.  . naproxen sodium (ANAPROX) 220 MG tablet Take 220 mg by mouth daily.   [name] omeprazole (PRILOSEC) 20 MG capsule Take 20 mg by mouth daily.    . rivaroxaban (XARELTO) 20 MG TABS tablet Take 1 tablet (20 mg total) by mouth daily with supper.  . Tiotropium Bromide Monohydrate (SPIRIVA RESPIMAT) 1.25 MCG/ACT AERS Inhale 2 puffs into the lungs daily.   No facility-administered encounter medications on file as of 08/07/2015.    Allergies  (verified) Lipitor; Ace inhibitors; Amlodipine; Lopid; Niacin and related; Rosuvastatin; Zocor; Penicillins; and Tamiflu   History: Past Medical History  Diagnosis Date  . Hypertension   . Hypercholesterolemia   . SOB (shortness of breath)   . Peripheral edema   . Nocturia   . Right bundle branch block   . PAF (paroxysmal atrial fibrillation) [facility]) January 2013    Placed on Pradaxa. Did not require cardioversion; spontaneously converted  . Tendonitis of elbow, left   . History of arthritis   . History of rheumatic fever    Past Surgical History  Procedure Laterality Date  . Cardiac catheterization  2009    NONOBSTRUCTIVE ATHERSCLEROTIC CORONARY DISEASE AND NORMAL  LV FUNCTION  . Back surgery  12/24/09    fusion C3-C4  . Cardiac catheterization N/A 09/08/2014    Procedure: Left Heart Cath and Coronary Angiography;  Surgeon: Belva Crome, MD;  Location: [place] [facility];  Service: Cardiovascular;  Laterality: N/A;  . Knee surgery Right 2007  . Patellar tendon repair Left 2008  . Triceps tendon repair Left 2013  . Back surgery  2010    fusion L4-L5   Family History  Problem Relation Age of Onset  . Heart attack Father   . COPD Father   . Hypertension Father   . Heart disease Brother   . COPD Brother   . Asthma Sister   .  Stroke Neg Hx    Social History   Occupational History  . retired     Art gallery manager   Social History Main Topics  . Smoking status: Never Smoker   . Smokeless tobacco: Never Used  . Alcohol Use: 1.2 oz/week    2 Cans of beer per week     Comment: 2 beers daily  . Drug Use: No  . Sexual Activity: No   Tobacco Counseling Counseling given: No   Activities of Daily Living In your present state of health, do you have any difficulty performing the following activities: 08/07/2015 09/08/2014  Hearing? N N  Vision? N N  Difficulty concentrating or making decisions? N N  Walking or climbing stairs? N Y  Dressing or bathing? N N  Doing  errands, shopping? N -  Preparing Food and eating ? N -  Using the Toilet? N -  In the past six months, have you accidently leaked urine? N -  Do you have problems with loss of bowel control? N -  Managing your Medications? N -  Managing your Finances? N -  Housekeeping or managing your Housekeeping? N -    Immunizations and Health Maintenance Immunization History  Administered Date(s) Administered  . Influenza Split 11/11/2010, 11/11/2011, 11/23/2012  . Influenza Whole 11/10/2009  . Influenza-Unspecified 11/10/2013, 10/23/2014  . Pneumococcal Conjugate-13 10/23/2014  . Pneumococcal Polysaccharide-23 11/10/2009   There are no preventive care reminders to display for this patient.  Patient Care Team: [name], MD as PCP - General (Family Medicine) Skeet Latch, MD as Attending Physician (Cardiology) Druscilla Brownie, MD as Consulting Physician (Dermatology) Deneise Lever, MD as Consulting Physician (Pulmonary Disease) Estill Cotta, MD as Referring Physician (Ophthalmology)    Assessment:   This is a routine wellness examination for Theodore [name].   Hearing/Vision screen  Hearing Screening   125Hz  250Hz  500Hz  1000Hz  2000Hz  4000Hz  8000Hz   Right ear:   40 0 40 0   Left ear:   40 0 40 40   Vision Screening Comments: Last eye exam in Sept 2016   Dietary issues and exercise activities discussed: Current Exercise Habits: The patient does not participate in regular exercise at present, Exercise limited by: None identified  Goals    . Increase water intake     Starting 08/07/2015, I will attempt to drink at least 6-8 glasses of water daily.       Depression Screen PHQ 2/9 Scores 08/07/2015 08/07/2015 01/18/2015  PHQ - 2 Score 0 0 0    Fall Risk Fall Risk  08/07/2015 08/07/2015  Falls in the past year? No No    Cognitive Function: MMSE - Mini Mental State Exam 08/07/2015  Orientation to time 5  Orientation to Place 5  Registration 3  Attention/ Calculation 0   Recall 3  Language- name 2 objects 0  Language- repeat 1  Language- follow 3 step command 3  Language- read & follow direction 0  Write a sentence 0  Copy design 0  Total score 20   PLEASE NOTE: A Mini-Cog screen was completed. Maximum score is 20. A value of 0 denotes this part of Folstein MMSE was not completed or the patient failed this part of the Mini-Cog screening.   Mini-Cog Screening Orientation to Time - Max 5 pts Orientation to Place - Max 5 pts Registration - Max 3 pts Recall - Max 3 pts Language Repeat - Max 1 pts Language Follow 3 Step Command - Max 3 pts  Screening Tests Health  Maintenance  Topic Date Due  . TETANUS/TDAP  08/06/2016 (Originally 09/12/1953)  . INFLUENZA VACCINE  09/11/2015  . DTaP/Tdap/Td  Completed  . ZOSTAVAX  Completed  . PNA vac Low Risk Adult  Completed        Plan:     I have personally reviewed and addressed the Medicare Annual Wellness questionnaire and have noted the following in the patient's chart:  A. Medical and social history B. Use of alcohol, tobacco or illicit drugs  C. Current medications and supplements D. Functional ability and status E.  Nutritional status F.  Physical activity G. Advance directives H. List of other physicians I.  Hospitalizations, surgeries, and ER visits in previous 12 months J.  [place] to include hearing, vision, cognitive, depression L. Referrals and appointments - none  In addition, I have reviewed and discussed with patient certain preventive protocols, quality metrics, and best practice recommendations. A written personalized care plan for preventive services as well as general preventive health recommendations were provided to patient.  See attached scanned questionnaire for additional information.   Signed,   Lindell Noe, MHA, BS, LPN Health Advisor

## 2015-08-07 NOTE — Progress Notes (Signed)
Pre visit review using our clinic review tool, if applicable. No additional management support is needed unless otherwise documented below in the visit note. 

## 2015-08-07 NOTE — Assessment & Plan Note (Signed)
As above, check for reversible causes of fatigue. F/u with cardiology.

## 2015-08-07 NOTE — Assessment & Plan Note (Signed)
Continue omeprazole 20mg  daily. Discussed backing off daily aleve.

## 2015-08-07 NOTE — Assessment & Plan Note (Addendum)
Ongoing trouble. Check labs for reversible causes of dyspnea/fatigue. If persists, aware to f/u sooner with cardiology for further evaluation.  He has had lower extremity arterial circulation within the past year (05/2014).

## 2015-08-07 NOTE — Assessment & Plan Note (Signed)
Followed by pulmonary. Currently only on spiriva and PRN albuterol.

## 2015-08-07 NOTE — Progress Notes (Signed)
PCP notes:  Health maintenance:  Tetanus - postponed/insurance  Abnormal screenings:   Hearing - failed  Patient concerns: None  Nurse concerns: None  Next PCP appt: 08/07/2015 @ 1415

## 2015-08-07 NOTE — Assessment & Plan Note (Signed)
Continue dilt, xarelto, metoprolol.

## 2015-08-07 NOTE — Assessment & Plan Note (Signed)
Chronic, stable. Continue current regimen. 

## 2015-08-08 NOTE — Progress Notes (Signed)
I reviewed health advisor's note, was available for consultation, and agree with documentation and plan.  

## 2015-08-09 ENCOUNTER — Other Ambulatory Visit: Payer: Self-pay | Admitting: Family Medicine

## 2015-08-09 ENCOUNTER — Telehealth: Payer: Self-pay | Admitting: Family Medicine

## 2015-08-09 ENCOUNTER — Encounter: Payer: Self-pay | Admitting: Family Medicine

## 2015-08-09 DIAGNOSIS — Z1211 Encounter for screening for malignant neoplasm of colon: Secondary | ICD-10-CM

## 2015-08-09 NOTE — Telephone Encounter (Signed)
See result note.  

## 2015-08-09 NOTE — Telephone Encounter (Signed)
Patient called to get his lab results.  °

## 2015-08-15 ENCOUNTER — Other Ambulatory Visit: Payer: Medicare Other

## 2015-08-15 DIAGNOSIS — Z1211 Encounter for screening for malignant neoplasm of colon: Secondary | ICD-10-CM

## 2015-08-15 LAB — FECAL OCCULT BLOOD, GUAIAC: Fecal Occult Blood: NEGATIVE

## 2015-08-16 ENCOUNTER — Encounter: Payer: Self-pay | Admitting: *Deleted

## 2015-08-16 LAB — FECAL OCCULT BLOOD, IMMUNOCHEMICAL: FECAL OCCULT BLD: NEGATIVE

## 2015-08-21 ENCOUNTER — Encounter: Payer: Self-pay | Admitting: Family Medicine

## 2015-09-05 ENCOUNTER — Other Ambulatory Visit: Payer: Self-pay

## 2015-09-05 ENCOUNTER — Telehealth: Payer: Self-pay

## 2015-09-05 NOTE — Telephone Encounter (Signed)
ror 

## 2015-09-06 MED ORDER — RIVAROXABAN 20 MG PO TABS: 20.0000 mg | ORAL_TABLET | Freq: Every day | ORAL | 1 refills | Status: DC

## 2015-09-24 NOTE — Progress Notes (Signed)
Cardiology Office Note   Date:  09/25/2015   ID:  Theodore [name], DOB Mar 22, 1934, MRN [id number]  PCP:  [name], MD  Cardiologist:   Skeet Latch, MD   Chief Complaint  Patient presents with  . New Patient (Initial Visit)    sob;when exerting self, along with fatique. Pt states no other Sx.      History of Present Illness: DMANI Theodore [name] is a 81 y.o. male with hypertension, hyperlipidemia, paroxysmal atrial fibrillation, asthma and Rheumatic fever who presents for follow up.  [name] was previously a patient of Dr. Mare Ferrari.  [name] was diagnosed with atrial fibrillation in 2014.  He was initially on Pradaxa and switched to Xarelto.  He had an episode of chest pain 08/2014 and had a Lexiscan Myoview 08/31/14 that showed an old inferior scar with peri-infarct ischemia and LVEF 49%.  He then had a LHC that revealed 70% ostial LAD, 50% prox LAD, 30% OM1 and 35% RCA.  He was managed medically.  He subsequently had an echo that showed LVEF 55-60% with mild MR and AR and grade 1 diastolic dysfunction.  At that time atorvastatin was increased to 80 mg but he noted leg pain.  He was seen in the lipid clinic and started back on atorvastatin 40mg  and zetia 10 mg.    [name] continues to report significant shortness of breath with minimal exertion.  One year ago he was able to walk a mile and play golf.  Now he has no energy and get short of breath after walking 200 feet.  He denies exertional chest pain but does note mild chest pain that occurs at rest.  He never has chest pain with exertion.  He denies nausea or diaphoresis.  He has gained 7 lb over this time period which he attributes to inactivity.  He notes mild lower extremity edema that is worse by the end of the day but better since starting lasix.  He denies orthopnea, PND, palpitations, lightheadedness or dizziness.    Past Medical History:  Diagnosis Date  . History of arthritis   . History of rheumatic fever   .  Hypercholesterolemia   . Hypertension   . Nocturia   . PAF (paroxysmal atrial fibrillation) [facility]) January 2013   Placed on Pradaxa. Did not require cardioversion; spontaneously converted  . Peripheral edema   . Right bundle branch block   . SOB (shortness of breath)   . Tendonitis of elbow, left     Past Surgical History:  Procedure Laterality Date  . BACK SURGERY  12/24/09   fusion C3-C4  . BACK SURGERY  2010   fusion L4-L5  . CARDIAC CATHETERIZATION  2009   NONOBSTRUCTIVE ATHERSCLEROTIC CORONARY DISEASE AND NORMAL  LV FUNCTION  . CARDIAC CATHETERIZATION N/A 09/08/2014   Procedure: Left Heart Cath and Coronary Angiography;  Surgeon: Belva Crome, MD;  Location: [place] [facility];  Service: Cardiovascular;  Laterality: N/A;  . COLONOSCOPY  2013   per patient, rpt 5 yrs  . KNEE SURGERY Right 2007  . PATELLAR TENDON REPAIR Left 2008  . TRICEPS TENDON REPAIR Left 2013     Current Outpatient Prescriptions  Medication Sig Dispense Refill  . albuterol (PROVENTIL HFA;VENTOLIN HFA) 108 (90 BASE) MCG/ACT inhaler Inhale 2 puffs into the lungs every 6 (six) hours as needed for wheezing or shortness of breath. 1 Inhaler prn  . Ascorbic Acid (VITAMIN C PO) Take 1 tablet by mouth daily.     [name]  atorvastatin (LIPITOR) 40 MG tablet Take 1 tablet (40 mg total) by mouth daily. 90 tablet 3  . diltiazem (CARTIA XT) 180 MG 24 hr capsule Take 1 capsule (180 mg total) by mouth daily. 90 capsule 1  . ezetimibe (ZETIA) 10 MG tablet Take 1 tablet (10 mg total) by mouth daily. 90 tablet 3  . furosemide (LASIX) 20 MG tablet Take 1 tablet (20 mg total) by mouth daily. 90 tablet 3  . KLOR-CON M10 10 MEQ tablet TAKE 1 TABLET TWICE A DAY (NEW DOSE) 180 tablet 1  . losartan (COZAAR) 100 MG tablet Take 1 tablet (100 mg total) by mouth daily. 90 tablet 1  . LYSINE PO Take 1 tablet by mouth daily.     . metoprolol tartrate (LOPRESSOR) 50 MG tablet Take 1 tablet (50 mg total) by mouth 2 (two) times daily. 180  tablet 3  . naproxen sodium (ANAPROX) 220 MG tablet Take 220 mg by mouth daily.     [name] omeprazole (PRILOSEC) 20 MG capsule Take 20 mg by mouth daily.      . rivaroxaban (XARELTO) 20 MG TABS tablet Take 1 tablet (20 mg total) by mouth daily with supper. 90 tablet 1  . Tiotropium Bromide Monohydrate (SPIRIVA RESPIMAT) 1.25 MCG/ACT AERS Inhale 2 puffs into the lungs daily. 3 Inhaler 3   No current facility-administered medications for this visit.     Allergies:   Lipitor [atorvastatin]; Ace inhibitors; Amlodipine; Lopid [gemfibrozil]; Niacin and related; Rosuvastatin; Zocor [simvastatin]; Penicillins; and Tamiflu    Social History:  The patient  reports that he has never smoked. He has never used smokeless tobacco. He reports that he drinks about 1.2 oz of alcohol per week . He reports that he does not use drugs.   Family History:  The patient's family history includes Asthma in his sister; COPD in his brother and father; Heart attack in his father; Heart disease in his brother; Hypertension in his father.    ROS:  Please see the history of present illness.   Otherwise, review of systems are positive for none.   All other systems are reviewed and negative.    PHYSICAL EXAM: VS:  BP (!) 150/82   Pulse 84   Ht 6' (1.829 m)   Wt 226 lb (102.5 kg)   BMI 30.65 kg/m  , BMI Body mass index is 30.65 kg/m. GENERAL:  Well appearing HEENT:  Pupils equal round and reactive, fundi not visualized, oral mucosa unremarkable NECK:  No jugular venous distention, waveform within normal limits, carotid upstroke brisk and symmetric, no bruits, no thyromegaly LYMPHATICS:  No cervical adenopathy LUNGS:  Clear to auscultation bilaterally HEART:  Irregularly irregular.  PMI not displaced or sustained,S1 and S2 within normal limits, no S3, no S4, no clicks, no rubs, no  murmurs ABD:  Flat, positive bowel sounds normal in frequency in pitch, no bruits, no rebound, no guarding, no midline pulsatile mass, no  hepatomegaly, no splenomegaly EXT:  2 plus pulses throughout, no edema, no cyanosis no clubbing SKIN:  No rashes no nodules NEURO:  Cranial nerves II through XII grossly intact, motor grossly intact throughout PSYCH:  Cognitively intact, oriented to person place and time    EKG:  EKG is ordered today. The ekg ordered today demonstrates atrial fibrillation.  Rate 84 bpm.    1. 1st Mrg lesion, 30% stenosed. 2. Prox LAD lesion, 50% stenosed. 3. Ost LAD lesion, 70% stenosed. 4. Prox RCA to Dist RCA lesion, 35% stenosed.   50-70%  ostial/proximal LAD stenosis within the heavily calcified segment. Recent myocardial perfusion energy and within the past week did not demonstrate anterior ischemia.  Heavily calcified but widely patent circumflex and right coronary with luminal irregularities noted in the proximal mid and distal segments.  Mild global left ventricular dysfunction with an ejection fraction in the 45-50% range   Recommendations:   The patient's symptoms are mixed. With exertion he experiences dyspnea. At rest and randomly he has neck jaw and left elbow discomfort. When both the angiogram and the myocardial perfusion study were considered together, no justification for percutaneous intervention on the LAD could be made.  Close clinical follow-up  Aggressive risk factor modification    Recent Labs: 07/11/2015: ALT 15 08/07/2015: BUN 21; Creatinine, Ser 1.27; Hemoglobin 11.9; Platelets 183.0; Potassium 4.3; Pro B Natriuretic peptide (BNP) 232.0; Sodium 135; TSH 1.64    Lipid Panel    Component Value Date/Time   CHOL 144 07/11/2015 0956   TRIG 84 07/11/2015 0956   HDL 57 07/11/2015 0956   CHOLHDL 2.5 07/11/2015 0956   VLDL 17 07/11/2015 0956   LDLCALC 70 07/11/2015 0956      Wt Readings from Last 3 Encounters:  09/25/15 226 lb (102.5 kg)  08/07/15 226 lb (102.5 kg)  08/07/15 226 lb (102.5 kg)      ASSESSMENT AND PLAN:  # CAD: # Exertional dyspnea: Mr.  Trimmer has exertional dyspnea and a known, 70% ostial LAD lesion.  This was not initially fixed because it did not correlate with the area of inferior ischemia noted on stress.  However, he continues to have exertional symptoms that are concerning for an anginal equivalent.  His blood pressure is above goal, so we will increase metoprolol to 75 mg bid with plans to consolidate to 150 mg of metoprolol succinate daily, as he recently refilled his prescription.  We will also refer him for cardiac cath to better assess the LAD lesion and possible FFR if indicated.  The cath films were reviewed today in clinic.  He is aware that this may not be able to be stented and possibly could need CABG.  We will hold his Xarelto starting tonight and he will take aspirin 81mg  daily until cath.  If he requires further medical management we can add long-acting nitrates or Ranexa.  # Persistent atrial fibrillation: Mr. Douge is currently in atrial fibrillation with rates that are well-controlled.  He is asymptomatic other than the exertional dyspnea.  This seems less likely to be the cause, as he is also dyspneic when in sinus rhtyhm.  Holding Xarelto as above.  Continue diltiazem and increase metoprolol as above.    This patients CHA2DS2-VASc Score and unadjusted Ischemic Stroke Rate (% per year) is equal to 3.2 % stroke rate/year from a score of 3  Above score calculated as 1 point each if present [CHF, HTN, DM, Vascular=MI/PAD/Aortic Plaque, Age if 65-74, or Male] Above score calculated as 2 points each if present [Age > 75, or Stroke/TIA/TE]   # Hypertension: Increase metoprolol as above.  Continue diltiazem.  # Hyperlipidemia: Continue atorvastatin and Zetia.  LDL 70 06/2015.   Current medicines are reviewed at length with the patient today.  The patient does not have concerns regarding medicines.  The following changes have been made:  Increase metoprolol to 75 mg bid.  Labs/ tests ordered today include:  No  orders of the defined types were placed in this encounter.    Disposition:   FU with Fiza Nation C. [name],  MD, [facility] in 1 month.    This note was written with the assistance of speech recognition software.  Please excuse any transcriptional errors.  Signed, Dvante Hands C. [name], MD, [facility]  09/25/2015 8:27 AM    [facility]

## 2015-09-25 ENCOUNTER — Ambulatory Visit (INDEPENDENT_AMBULATORY_CARE_PROVIDER_SITE_OTHER): Payer: Medicare Other | Admitting: Cardiovascular Disease

## 2015-09-25 VITALS — BP 150/82 | HR 84 | Ht 72.0 in | Wt 226.0 lb

## 2015-09-25 DIAGNOSIS — R0789 Other chest pain: Secondary | ICD-10-CM

## 2015-09-25 DIAGNOSIS — I480 Paroxysmal atrial fibrillation: Secondary | ICD-10-CM

## 2015-09-25 DIAGNOSIS — I10 Essential (primary) hypertension: Secondary | ICD-10-CM | POA: Diagnosis not present

## 2015-09-25 DIAGNOSIS — I208 Other forms of angina pectoris: Secondary | ICD-10-CM | POA: Diagnosis not present

## 2015-09-25 DIAGNOSIS — E785 Hyperlipidemia, unspecified: Secondary | ICD-10-CM

## 2015-09-25 DIAGNOSIS — I209 Angina pectoris, unspecified: Secondary | ICD-10-CM | POA: Diagnosis not present

## 2015-09-25 LAB — BASIC METABOLIC PANEL
BUN: 18 mg/dL (ref 7–25)
CALCIUM: 9.5 mg/dL (ref 8.6–10.3)
CO2: 27 mmol/L (ref 20–31)
Chloride: 101 mmol/L (ref 98–110)
Creat: 1.09 mg/dL (ref 0.70–1.18)
GLUCOSE: 106 mg/dL — AB (ref 65–99)
Potassium: 4.9 mmol/L (ref 3.5–5.3)
SODIUM: 137 mmol/L (ref 135–146)

## 2015-09-25 NOTE — Patient Instructions (Addendum)
Medication Instructions:  INCREASE YOUR METOPROLOL TO 75 MG TWICE A DAY  HOLD YOUR XARELTO STARTING TODAY  START ASPIRIN 81 MG DAILY STARTING TODAY  Labwork: BMET/CBC/PTT/PT/INR AT [facility] ON THE FIRST FLOOR   Testing/Procedures: Your physician has requested that you have a cardiac catheterization. Cardiac catheterization is used to diagnose and/or treat various heart conditions. Doctors may recommend this procedure for a number of different reasons. The most common reason is to evaluate chest pain. Chest pain can be a symptom of coronary artery disease (CAD), and cardiac catheterization can show whether plaque is narrowing or blocking your heart's arteries. This procedure is also used to evaluate the valves, as well as measure the blood flow and oxygen levels in different parts of your heart. For further information please visit [email or web address]. Please follow instruction sheet, as given.  Follow-Up: Your physician recommends that you schedule a follow-up appointment in: 1 month ov  Any Other Special Instructions Will Be Listed Below (If Applicable).  You are scheduled for a cardiac catheterization on 09/28/15 with Dr. Ellyn Hack or associate.  Go to [facility] 2nd Floor Short Stay on 09/28/15 at 5:30.  Enter thru the [employer] entrance A and check in at Admitting No food or drink after midnight night before. You may take your medications with a sip of water on the day of your procedure except your Furosemide, hold morning of

## 2015-09-26 LAB — CBC WITH DIFFERENTIAL/PLATELET
BASOS ABS: 0 {cells}/uL (ref 0–200)
Basophils Relative: 0 %
EOS ABS: 58 {cells}/uL (ref 15–500)
EOS PCT: 1 %
HCT: 46.9 % (ref 38.5–50.0)
HEMOGLOBIN: 15.3 g/dL (ref 13.2–17.1)
Lymphs Abs: 2088 cells/uL (ref 850–3900)
MCH: 29.2 pg (ref 27.0–33.0)
MCHC: 32.6 g/dL (ref 32.0–36.0)
MCV: 89.5 fL (ref 80.0–100.0)
MPV: 10.3 fL (ref 7.5–12.5)
Monocytes Absolute: 754 cells/uL (ref 200–950)
Monocytes Relative: 13 %
NEUTROS ABS: 2900 {cells}/uL (ref 1500–7800)
NEUTROS PCT: 50 %
Platelets: 155 10*3/uL (ref 140–400)
RBC: 5.24 MIL/uL (ref 4.20–5.80)
RDW: 21.9 % — AB (ref 11.0–15.0)
WBC: 5.8 10*3/uL (ref 3.8–10.8)

## 2015-09-26 LAB — PROTIME-INR
INR: 1.2 — ABNORMAL HIGH
PROTHROMBIN TIME: 13.1 s — AB (ref 9.0–11.5)

## 2015-09-26 LAB — APTT: APTT: 32 s (ref 22–34)

## 2015-09-28 ENCOUNTER — Ambulatory Visit (HOSPITAL_COMMUNITY)
Admission: RE | Admit: 2015-09-28 | Discharge: 2015-09-28 | Disposition: A | Discharge status: Home / Self Care | Payer: Medicare Other | Source: Ambulatory Visit | Attending: Cardiology | Admitting: Cardiology

## 2015-09-28 ENCOUNTER — Ambulatory Visit (HOSPITAL_COMMUNITY)
Admission: RE | Disposition: A | Discharge status: Home / Self Care | Payer: Self-pay | Source: Ambulatory Visit | Attending: Cardiology

## 2015-09-28 DIAGNOSIS — J45909 Unspecified asthma, uncomplicated: Secondary | ICD-10-CM | POA: Diagnosis not present

## 2015-09-28 DIAGNOSIS — I481 Persistent atrial fibrillation: Secondary | ICD-10-CM | POA: Diagnosis not present

## 2015-09-28 DIAGNOSIS — I2510 Atherosclerotic heart disease of native coronary artery without angina pectoris: Secondary | ICD-10-CM | POA: Diagnosis present

## 2015-09-28 DIAGNOSIS — I10 Essential (primary) hypertension: Secondary | ICD-10-CM | POA: Insufficient documentation

## 2015-09-28 DIAGNOSIS — R0609 Other forms of dyspnea: Secondary | ICD-10-CM

## 2015-09-28 DIAGNOSIS — Z7901 Long term (current) use of anticoagulants: Secondary | ICD-10-CM | POA: Insufficient documentation

## 2015-09-28 DIAGNOSIS — I208 Other forms of angina pectoris: Secondary | ICD-10-CM

## 2015-09-28 DIAGNOSIS — I25119 Atherosclerotic heart disease of native coronary artery with unspecified angina pectoris: Secondary | ICD-10-CM

## 2015-09-28 DIAGNOSIS — I480 Paroxysmal atrial fibrillation: Secondary | ICD-10-CM | POA: Insufficient documentation

## 2015-09-28 DIAGNOSIS — E785 Hyperlipidemia, unspecified: Secondary | ICD-10-CM | POA: Insufficient documentation

## 2015-09-28 DIAGNOSIS — I25118 Atherosclerotic heart disease of native coronary artery with other forms of angina pectoris: Secondary | ICD-10-CM | POA: Insufficient documentation

## 2015-09-28 DIAGNOSIS — R9439 Abnormal result of other cardiovascular function study: Secondary | ICD-10-CM | POA: Diagnosis present

## 2015-09-28 DIAGNOSIS — I4819 Other persistent atrial fibrillation: Secondary | ICD-10-CM | POA: Diagnosis present

## 2015-09-28 DIAGNOSIS — I4891 Unspecified atrial fibrillation: Secondary | ICD-10-CM | POA: Diagnosis present

## 2015-09-28 DIAGNOSIS — Z880 Allergy status to penicillin: Secondary | ICD-10-CM | POA: Insufficient documentation

## 2015-09-28 DIAGNOSIS — I209 Angina pectoris, unspecified: Secondary | ICD-10-CM | POA: Diagnosis present

## 2015-09-28 DIAGNOSIS — Z9861 Coronary angioplasty status: Secondary | ICD-10-CM | POA: Diagnosis present

## 2015-09-28 HISTORY — PX: CARDIAC CATHETERIZATION: SHX172

## 2015-09-28 LAB — POCT ACTIVATED CLOTTING TIME
ACTIVATED CLOTTING TIME: 197 s
ACTIVATED CLOTTING TIME: 180 s

## 2015-09-28 SURGERY — LEFT HEART CATH AND CORONARY ANGIOGRAPHY
Site: Groin

## 2015-09-28 MED ORDER — ACETAMINOPHEN 325 MG PO TABS: 650.0000 mg | ORAL_TABLET | ORAL | Status: DC | PRN

## 2015-09-28 MED ORDER — ONDANSETRON HCL 4 MG/2ML IJ SOLN: 4.0000 mg | Freq: Four times a day (QID) | INTRAMUSCULAR | Status: DC | PRN

## 2015-09-28 MED ORDER — NITROGLYCERIN 1 MG/10 ML FOR IR/CATH LAB
INTRA_ARTERIAL | Status: AC
  Filled 2015-09-28: qty 10

## 2015-09-28 MED ORDER — IOPAMIDOL (ISOVUE-370) INJECTION 76%
INTRAVENOUS | Status: AC
  Filled 2015-09-28: qty 100

## 2015-09-28 MED ORDER — HEPARIN SODIUM (PORCINE) 1000 UNIT/ML IJ SOLN
INTRAMUSCULAR | Status: AC
  Filled 2015-09-28: qty 1

## 2015-09-28 MED ORDER — NITROGLYCERIN 1 MG/10 ML FOR IR/CATH LAB
INTRA_ARTERIAL | Status: DC | PRN
  Administered 2015-09-28 (×2): 200 ug via INTRACORONARY

## 2015-09-28 MED ORDER — HEPARIN (PORCINE) IN NACL 2-0.9 UNIT/ML-% IJ SOLN
INTRAMUSCULAR | Status: DC | PRN
  Administered 2015-09-28: 1500 mL via INTRA_ARTERIAL

## 2015-09-28 MED ORDER — SODIUM CHLORIDE 0.9 % IV SOLN: 250.0000 mL | INTRAVENOUS | Status: DC | PRN

## 2015-09-28 MED ORDER — FENTANYL CITRATE (PF) 100 MCG/2ML IJ SOLN
INTRAMUSCULAR | Status: DC | PRN
  Administered 2015-09-28 (×4): 25 ug via INTRAVENOUS

## 2015-09-28 MED ORDER — ASPIRIN 81 MG PO CHEW: 81.0000 mg | CHEWABLE_TABLET | ORAL | Status: AC

## 2015-09-28 MED ORDER — LOSARTAN POTASSIUM 50 MG PO TABS
100.0000 mg | ORAL_TABLET | Freq: Every day | ORAL | Status: DC
  Filled 2015-09-28: qty 2

## 2015-09-28 MED ORDER — MIDAZOLAM HCL 2 MG/2ML IJ SOLN
INTRAMUSCULAR | Status: AC
  Filled 2015-09-28: qty 2

## 2015-09-28 MED ORDER — IOPAMIDOL (ISOVUE-370) INJECTION 76%
INTRAVENOUS | Status: DC | PRN
  Administered 2015-09-28: 140 mL via INTRA_ARTERIAL

## 2015-09-28 MED ORDER — FENTANYL CITRATE (PF) 100 MCG/2ML IJ SOLN
INTRAMUSCULAR | Status: AC
Start: 2015-09-28 — End: 2015-09-28
  Filled 2015-09-28: qty 2

## 2015-09-28 MED ORDER — ADENOSINE 12 MG/4ML IV SOLN
INTRAVENOUS | Status: AC
  Filled 2015-09-28: qty 4

## 2015-09-28 MED ORDER — MIDAZOLAM HCL 2 MG/2ML IJ SOLN
INTRAMUSCULAR | Status: DC | PRN
  Administered 2015-09-28 (×2): 1 mg via INTRAVENOUS
  Administered 2015-09-28: 2 mg via INTRAVENOUS

## 2015-09-28 MED ORDER — SODIUM CHLORIDE 0.9% FLUSH: 3.0000 mL | Freq: Two times a day (BID) | INTRAVENOUS | Status: DC

## 2015-09-28 MED ORDER — HEPARIN (PORCINE) IN NACL 2-0.9 UNIT/ML-% IJ SOLN
INTRAMUSCULAR | Status: AC
  Filled 2015-09-28: qty 1500

## 2015-09-28 MED ORDER — HEPARIN SODIUM (PORCINE) 1000 UNIT/ML IJ SOLN
INTRAMUSCULAR | Status: DC | PRN
  Administered 2015-09-28: 2000 [IU] via INTRAVENOUS
  Administered 2015-09-28: 7000 [IU] via INTRAVENOUS

## 2015-09-28 MED ORDER — LIDOCAINE HCL (PF) 1 % IJ SOLN
INTRAMUSCULAR | Status: AC
  Filled 2015-09-28: qty 30

## 2015-09-28 MED ORDER — ADENOSINE (DIAGNOSTIC) 140MCG/KG/MIN
INTRAVENOUS | Status: DC | PRN
  Administered 2015-09-28: 140 ug/kg/min via INTRAVENOUS

## 2015-09-28 MED ORDER — LIDOCAINE HCL (PF) 1 % IJ SOLN
INTRAMUSCULAR | Status: DC | PRN
  Administered 2015-09-28: 20 mL via INTRADERMAL
  Administered 2015-09-28: 3 mL via INTRADERMAL

## 2015-09-28 MED ORDER — IOPAMIDOL (ISOVUE-370) INJECTION 76%
INTRAVENOUS | Status: AC
  Filled 2015-09-28: qty 50

## 2015-09-28 MED ORDER — SODIUM CHLORIDE 0.9% FLUSH: 3.0000 mL | INTRAVENOUS | Status: DC | PRN

## 2015-09-28 MED ORDER — SODIUM CHLORIDE 0.9 % WEIGHT BASED INFUSION
3.0000 mL/kg/h | INTRAVENOUS | Status: AC
  Administered 2015-09-28: 3 mL/kg/h via INTRAVENOUS

## 2015-09-28 MED ORDER — SODIUM CHLORIDE 0.9 % WEIGHT BASED INFUSION: 1.0000 mL/kg/h | INTRAVENOUS | Status: DC

## 2015-09-28 MED ORDER — SODIUM CHLORIDE 0.9 % WEIGHT BASED INFUSION: 1.0000 mL/kg/h | INTRAVENOUS | Status: AC

## 2015-09-28 MED ORDER — ADENOSINE 12 MG/4ML IV SOLN
INTRAVENOUS | Status: AC
  Filled 2015-09-28: qty 12

## 2015-09-28 MED ORDER — VERAPAMIL HCL 2.5 MG/ML IV SOLN
INTRAVENOUS | Status: AC
  Filled 2015-09-28: qty 2

## 2015-09-28 SURGICAL SUPPLY — 15 items
CATH INFINITI 5FR MULTPACK ANG (CATHETERS) ×4 IMPLANT
CATH VISTA GUIDE 6FR XBLAD3.5 (CATHETERS) ×4 IMPLANT
COVER PRB 48X5XTLSCP FOLD TPE (BAG) ×2 IMPLANT
COVER PROBE 5X48 (BAG) ×2
GLIDESHEATH SLEND A-KIT 6F 22G (SHEATH) ×4 IMPLANT
GUIDEWIRE PRESSURE COMET II (WIRE) ×4 IMPLANT
KIT ESSENTIALS PG (KITS) ×4 IMPLANT
KIT HEART LEFT (KITS) ×4 IMPLANT
PACK CARDIAC CATHETERIZATION (CUSTOM PROCEDURE TRAY) ×4 IMPLANT
SHEATH PINNACLE 5F 10CM (SHEATH) ×4 IMPLANT
SHEATH PINNACLE 6F 10CM (SHEATH) ×4 IMPLANT
SYR MEDRAD MARK V 150ML (SYRINGE) ×4 IMPLANT
TRANSDUCER W/STOPCOCK (MISCELLANEOUS) ×4 IMPLANT
TUBING CIL FLEX 10 FLL-RA (TUBING) ×4 IMPLANT
WIRE EMERALD 3MM-J .035X150CM (WIRE) ×4 IMPLANT

## 2015-09-28 NOTE — Interval H&P Note (Signed)
History and Physical Interval Note:  09/28/2015 6:59 AM  Theodore [name]  has presented today for surgery, with the diagnosis of exertional dyspnea as an anginal class III equivalent . He has known CAD with 70% ostial LAD lesion, and history of abnormal nuclear stress test with ischemia in a different distribution. Principal Problem:   Angina, class III ([place]) - with Dyspnea as major Sx. Active Problems:   Dyspnea on exertion   Abnormal nuclear stress test   CAD (coronary artery disease), native coronary artery   Hyperlipemia   Paroxysmal atrial fibrillation (HCC)   Essential hypertension    The various methods of treatment have been discussed with the patient and family. After consideration of risks, benefits and other options for treatment, the patient has consented to  Procedure(s):  Left Heart Cath and Coronary Angiography (N/A)   Possible Percutaneous Coronary Intervention  as a surgical intervention .  The patient's history has been reviewed, patient examined, no change in status, stable for surgery.  I have reviewed the patient's chart and labs.  Questions were answered to the patient's satisfaction.     [name]  Cath Lab Visit (complete for each Cath Lab visit)  Clinical Evaluation Leading to the Procedure:   ACS: No.  Non-ACS:    Anginal Classification: CCS III  Anti-ischemic medical therapy: Maximal Therapy (2 or more classes of medications)  Non-Invasive Test Results: Intermediate-risk stress test findings: cardiac mortality 1-3%/year  Prior CABG: No previous CABG   Ischemic Symptoms? CCS III (Marked limitation of ordinary activity) Anti-ischemic Medical Therapy? Maximal Medical Therapy (2 or more classes of medications) Non-invasive Test Results? Intermediate-risk stress test findings: cardiac mortality 1-3%/year Prior CABG? No Previous CABG   Patient Information:   1-2V CAD, no prox LAD  A (8)  Indication: 17; Score: 8   Patient  Information:   CTO of 1 vessel, no other CAD  A (7)  Indication: 27; Score: 7   Patient Information:   1V CAD with prox LAD  A (9)  Indication: 33; Score: 9   Patient Information:   2V-CAD with prox LAD  A (9)  Indication: 39; Score: 9   Patient Information:   3V-CAD without LMCA  A (9)  Indication: 45; Score: 9   Patient Information:   3V-CAD without LMCA With Abnormal LV systolic function  A (9)  Indication: 48; Score: 9   Patient Information:   LMCA-CAD  A (9)  Indication: 49; Score: 9   Patient Information:   2V-CAD with prox LAD PCI  A (7)  Indication: 62; Score: 7   Patient Information:   2V-CAD with prox LAD CABG  A (8)  Indication: 62; Score: 8   Patient Information:   3V-CAD without LMCA With Low CAD burden(i.e., 3 focal stenoses, low SYNTAX score) PCI  A (7)  Indication: 63; Score: 7   Patient Information:   3V-CAD without LMCA With Low CAD burden(i.e., 3 focal stenoses, low SYNTAX score) CABG  A (9)  Indication: 63; Score: 9   Patient Information:   3V-CAD without LMCA E06c - Intermediate-high CAD burden (i.e., multiple diffuse lesions, presence of CTO, or high SYNTAX score) PCI  U (4)  Indication: 64; Score: 4   Patient Information:   3V-CAD without LMCA E06c - Intermediate-high CAD burden (i.e., multiple diffuse lesions, presence of CTO, or high SYNTAX score) CABG  A (9)  Indication: 64; Score: 9   Patient Information:   LMCA-CAD With Isolated LMCA stenosis  PCI  U (6)  Indication: 65; Score: 6   Patient Information:   LMCA-CAD With Isolated LMCA stenosis  CABG  A (9)  Indication: 65; Score: 9   Patient Information:   LMCA-CAD Additional CAD, low CAD burden (i.e., 1- to 2-vessel additional involvement, low SYNTAX score) PCI  U (5)  Indication: 66; Score: 5   Patient Information:   LMCA-CAD Additional CAD, low CAD burden (i.e., 1- to 2-vessel additional involvement, low SYNTAX  score) CABG  A (9)  Indication: 66; Score: 9   Patient Information:   LMCA-CAD Additional CAD, intermediate-high CAD burden (i.e., 3-vessel involvement, presence of CTO, or high SYNTAX score) PCI  I (3)  Indication: 67; Score: 3   Patient Information:   LMCA-CAD Additional CAD, intermediate-high CAD burden (i.e., 3-vessel involvement, presence of CTO, or high SYNTAX score) CABG  A (9)  Indication: 67; Score: 9   [name], MD

## 2015-09-28 NOTE — Progress Notes (Signed)
Client up and walked and tol well; right groin stable no bleeding or hematoma

## 2015-09-28 NOTE — Discharge Instructions (Signed)

## 2015-09-28 NOTE — H&P (View-Only) (Signed)
Cardiology Office Note   Date:  09/25/2015   ID:  Theodore [name], DOB 05-Mar-1934, MRN [id number]  PCP:  [name], MD  Cardiologist:   Skeet Latch, MD   Chief Complaint  Patient presents with  . New Patient (Initial Visit)    sob;when exerting self, along with fatique. Pt states no other Sx.      History of Present Illness: Theodore [name] is a 81 y.o. male with hypertension, hyperlipidemia, paroxysmal atrial fibrillation, asthma and Rheumatic fever who presents for follow up.  Theodore [name] was previously a patient of Dr. Mare Ferrari.  Theodore [name] was diagnosed with atrial fibrillation in 2014.  He was initially on Pradaxa and switched to Xarelto.  He had an episode of chest pain 08/2014 and had a Lexiscan Myoview 08/31/14 that showed an old inferior scar with peri-infarct ischemia and LVEF 49%.  He then had a LHC that revealed 70% ostial LAD, 50% prox LAD, 30% OM1 and 35% RCA.  He was managed medically.  He subsequently had an echo that showed LVEF 55-60% with mild MR and AR and grade 1 diastolic dysfunction.  At that time atorvastatin was increased to 80 mg but he noted leg pain.  He was seen in the lipid clinic and started back on atorvastatin 40mg  and zetia 10 mg.    Theodore [name] continues to report significant shortness of breath with minimal exertion.  One year ago he was able to walk a mile and play golf.  Now he has no energy and get short of breath after walking 200 feet.  He denies exertional chest pain but does note mild chest pain that occurs at rest.  He never has chest pain with exertion.  He denies nausea or diaphoresis.  He has gained 7 lb over this time period which he attributes to inactivity.  He notes mild lower extremity edema that is worse by the end of the day but better since starting lasix.  He denies orthopnea, PND, palpitations, lightheadedness or dizziness.    Past Medical History:  Diagnosis Date  . History of arthritis   . History of rheumatic fever   .  Hypercholesterolemia   . Hypertension   . Nocturia   . PAF (paroxysmal atrial fibrillation) [facility]) January 2013   Placed on Pradaxa. Did not require cardioversion; spontaneously converted  . Peripheral edema   . Right bundle branch block   . SOB (shortness of breath)   . Tendonitis of elbow, left     Past Surgical History:  Procedure Laterality Date  . BACK SURGERY  12/24/09   fusion C3-C4  . BACK SURGERY  2010   fusion L4-L5  . CARDIAC CATHETERIZATION  2009   NONOBSTRUCTIVE ATHERSCLEROTIC CORONARY DISEASE AND NORMAL  LV FUNCTION  . CARDIAC CATHETERIZATION N/A 09/08/2014   Procedure: Left Heart Cath and Coronary Angiography;  Surgeon: Belva Crome, MD;  Location: [place] [facility];  Service: Cardiovascular;  Laterality: N/A;  . COLONOSCOPY  2013   per patient, rpt 5 yrs  . KNEE SURGERY Right 2007  . PATELLAR TENDON REPAIR Left 2008  . TRICEPS TENDON REPAIR Left 2013     Current Outpatient Prescriptions  Medication Sig Dispense Refill  . albuterol (PROVENTIL HFA;VENTOLIN HFA) 108 (90 BASE) MCG/ACT inhaler Inhale 2 puffs into the lungs every 6 (six) hours as needed for wheezing or shortness of breath. 1 Inhaler prn  . Ascorbic Acid (VITAMIN C PO) Take 1 tablet by mouth daily.     [name]  atorvastatin (LIPITOR) 40 MG tablet Take 1 tablet (40 mg total) by mouth daily. 90 tablet 3  . diltiazem (CARTIA XT) 180 MG 24 hr capsule Take 1 capsule (180 mg total) by mouth daily. 90 capsule 1  . ezetimibe (ZETIA) 10 MG tablet Take 1 tablet (10 mg total) by mouth daily. 90 tablet 3  . furosemide (LASIX) 20 MG tablet Take 1 tablet (20 mg total) by mouth daily. 90 tablet 3  . KLOR-CON M10 10 MEQ tablet TAKE 1 TABLET TWICE A DAY (NEW DOSE) 180 tablet 1  . losartan (COZAAR) 100 MG tablet Take 1 tablet (100 mg total) by mouth daily. 90 tablet 1  . LYSINE PO Take 1 tablet by mouth daily.     . metoprolol tartrate (LOPRESSOR) 50 MG tablet Take 1 tablet (50 mg total) by mouth 2 (two) times daily. 180  tablet 3  . naproxen sodium (ANAPROX) 220 MG tablet Take 220 mg by mouth daily.     [name] omeprazole (PRILOSEC) 20 MG capsule Take 20 mg by mouth daily.      . rivaroxaban (XARELTO) 20 MG TABS tablet Take 1 tablet (20 mg total) by mouth daily with supper. 90 tablet 1  . Tiotropium Bromide Monohydrate (SPIRIVA RESPIMAT) 1.25 MCG/ACT AERS Inhale 2 puffs into the lungs daily. 3 Inhaler 3   No current facility-administered medications for this visit.     Allergies:   Lipitor [atorvastatin]; Ace inhibitors; Amlodipine; Lopid [gemfibrozil]; Niacin and related; Rosuvastatin; Zocor [simvastatin]; Penicillins; and Tamiflu    Social History:  The patient  reports that he has never smoked. He has never used smokeless tobacco. He reports that he drinks about 1.2 oz of alcohol per week . He reports that he does not use drugs.   Family History:  The patient's family history includes Asthma in his sister; COPD in his brother and father; Heart attack in his father; Heart disease in his brother; Hypertension in his father.    ROS:  Please see the history of present illness.   Otherwise, review of systems are positive for none.   All other systems are reviewed and negative.    PHYSICAL EXAM: VS:  BP (!) 150/82   Pulse 84   Ht 6' (1.829 m)   Wt 226 lb (102.5 kg)   BMI 30.65 kg/m  , BMI Body mass index is 30.65 kg/m. GENERAL:  Well appearing HEENT:  Pupils equal round and reactive, fundi not visualized, oral mucosa unremarkable NECK:  No jugular venous distention, waveform within normal limits, carotid upstroke brisk and symmetric, no bruits, no thyromegaly LYMPHATICS:  No cervical adenopathy LUNGS:  Clear to auscultation bilaterally HEART:  Irregularly irregular.  PMI not displaced or sustained,S1 and S2 within normal limits, no S3, no S4, no clicks, no rubs, no  murmurs ABD:  Flat, positive bowel sounds normal in frequency in pitch, no bruits, no rebound, no guarding, no midline pulsatile mass, no  hepatomegaly, no splenomegaly EXT:  2 plus pulses throughout, no edema, no cyanosis no clubbing SKIN:  No rashes no nodules NEURO:  Cranial nerves II through XII grossly intact, motor grossly intact throughout PSYCH:  Cognitively intact, oriented to person place and time    EKG:  EKG is ordered today. The ekg ordered today demonstrates atrial fibrillation.  Rate 84 bpm.    1. 1st Mrg lesion, 30% stenosed. 2. Prox LAD lesion, 50% stenosed. 3. Ost LAD lesion, 70% stenosed. 4. Prox RCA to Dist RCA lesion, 35% stenosed.   50-70%  ostial/proximal LAD stenosis within the heavily calcified segment. Recent myocardial perfusion energy and within the past week did not demonstrate anterior ischemia.  Heavily calcified but widely patent circumflex and right coronary with luminal irregularities noted in the proximal mid and distal segments.  Mild global left ventricular dysfunction with an ejection fraction in the 45-50% range   Recommendations:   The patient's symptoms are mixed. With exertion he experiences dyspnea. At rest and randomly he has neck jaw and left elbow discomfort. When both the angiogram and the myocardial perfusion study were considered together, no justification for percutaneous intervention on the LAD could be made.  Close clinical follow-up  Aggressive risk factor modification    Recent Labs: 07/11/2015: ALT 15 08/07/2015: BUN 21; Creatinine, Ser 1.27; Hemoglobin 11.9; Platelets 183.0; Potassium 4.3; Pro B Natriuretic peptide (BNP) 232.0; Sodium 135; TSH 1.64    Lipid Panel    Component Value Date/Time   CHOL 144 07/11/2015 0956   TRIG 84 07/11/2015 0956   HDL 57 07/11/2015 0956   CHOLHDL 2.5 07/11/2015 0956   VLDL 17 07/11/2015 0956   LDLCALC 70 07/11/2015 0956      Wt Readings from Last 3 Encounters:  09/25/15 226 lb (102.5 kg)  08/07/15 226 lb (102.5 kg)  08/07/15 226 lb (102.5 kg)      ASSESSMENT AND PLAN:  # CAD: # Exertional dyspnea: Mr.  [name] has exertional dyspnea and a known, 70% ostial LAD lesion.  This was not initially fixed because it did not correlate with the area of inferior ischemia noted on stress.  However, he continues to have exertional symptoms that are concerning for an anginal equivalent.  His blood pressure is above goal, so we will increase metoprolol to 75 mg bid with plans to consolidate to 150 mg of metoprolol succinate daily, as he recently refilled his prescription.  We will also refer him for cardiac cath to better assess the LAD lesion and possible FFR if indicated.  The cath films were reviewed today in clinic.  He is aware that this may not be able to be stented and possibly could need CABG.  We will hold his Xarelto starting tonight and he will take aspirin 81mg  daily until cath.  If he requires further medical management we can add long-acting nitrates or Ranexa.  # Persistent atrial fibrillation: Theodore [name] is currently in atrial fibrillation with rates that are well-controlled.  He is asymptomatic other than the exertional dyspnea.  This seems less likely to be the cause, as he is also dyspneic when in sinus rhtyhm.  Holding Xarelto as above.  Continue diltiazem and increase metoprolol as above.    This patients CHA2DS2-VASc Score and unadjusted Ischemic Stroke Rate (% per year) is equal to 3.2 % stroke rate/year from a score of 3  Above score calculated as 1 point each if present [CHF, HTN, DM, Vascular=MI/PAD/Aortic Plaque, Age if 65-74, or Male] Above score calculated as 2 points each if present [Age > 75, or Stroke/TIA/TE]   # Hypertension: Increase metoprolol as above.  Continue diltiazem.  # Hyperlipidemia: Continue atorvastatin and Zetia.  LDL 70 06/2015.   Current medicines are reviewed at length with the patient today.  The patient does not have concerns regarding medicines.  The following changes have been made:  Increase metoprolol to 75 mg bid.  Labs/ tests ordered today include:  No  orders of the defined types were placed in this encounter.    Disposition:   FU with Karole Oo C. [name],  MD, [facility] in 1 month.    This note was written with the assistance of speech recognition software.  Please excuse any transcriptional errors.  Signed, Davontae Prusinski C. [name], MD, [facility]  09/25/2015 8:27 AM    [facility]

## 2015-09-28 NOTE — Progress Notes (Addendum)
Site area: RFA Site Prior to Removal:  Level 0 Pressure Applied For:84min Manual: yes   Patient Status During Pull: stable  Post Pull Site:  Level 0 Post Pull Instructions Given: yes  Post Pull Pulses Present: palpable Dressing Applied: tegaderm  Bedrest begins @ 1130 till 1730 Comments:

## 2015-09-29 ENCOUNTER — Encounter: Payer: Self-pay | Admitting: Cardiovascular Disease

## 2015-10-01 ENCOUNTER — Encounter (HOSPITAL_COMMUNITY): Payer: Self-pay | Admitting: Cardiology

## 2015-10-01 LAB — POCT ACTIVATED CLOTTING TIME
ACTIVATED CLOTTING TIME: 235 s
ACTIVATED CLOTTING TIME: 246 s

## 2015-10-02 MED FILL — Verapamil HCl IV Soln 2.5 MG/ML: INTRAVENOUS | Qty: 2 | Status: AC

## 2015-10-04 ENCOUNTER — Other Ambulatory Visit: Payer: Medicare Other

## 2015-10-08 ENCOUNTER — Ambulatory Visit (INDEPENDENT_AMBULATORY_CARE_PROVIDER_SITE_OTHER): Payer: Medicare Other | Admitting: Cardiovascular Disease

## 2015-10-08 VITALS — BP 140/86 | HR 70 | Ht 72.0 in | Wt 224.6 lb

## 2015-10-08 DIAGNOSIS — I209 Angina pectoris, unspecified: Secondary | ICD-10-CM | POA: Diagnosis not present

## 2015-10-08 DIAGNOSIS — I481 Persistent atrial fibrillation: Secondary | ICD-10-CM | POA: Diagnosis not present

## 2015-10-08 DIAGNOSIS — I5042 Chronic combined systolic (congestive) and diastolic (congestive) heart failure: Secondary | ICD-10-CM | POA: Diagnosis not present

## 2015-10-08 DIAGNOSIS — I208 Other forms of angina pectoris: Secondary | ICD-10-CM

## 2015-10-08 DIAGNOSIS — E785 Hyperlipidemia, unspecified: Secondary | ICD-10-CM

## 2015-10-08 DIAGNOSIS — I10 Essential (primary) hypertension: Secondary | ICD-10-CM

## 2015-10-08 DIAGNOSIS — I4819 Other persistent atrial fibrillation: Secondary | ICD-10-CM

## 2015-10-08 NOTE — Progress Notes (Signed)
Cardiology Office Note   Date:  10/08/2015   ID:  Theodore [name], DOB 1934/03/17, MRN [id number]  PCP:  [name], MD  Cardiologist:   Skeet Latch, MD   Chief Complaint  Patient presents with  . Follow-up    pt states sob or tireness has not improved from the last visit. Pt states no other Sx. or concernsa      History of Present Illness: Theodore [name] is a 81 y.o. male with hypertension, hyperlipidemia, paroxysmal atrial fibrillation, asthma and Rheumatic fever who presents for follow up.  Theodore [name] was previously a patient of Dr. Mare Ferrari.  Theodore [name] was diagnosed with atrial fibrillation in 2014.  He was initially on Pradaxa and switched to Xarelto.  He had an episode of chest pain 08/2014 and had a Lexiscan Myoview 08/31/14 that showed an old inferior scar with peri-infarct ischemia and LVEF 49%.  He then had a LHC that revealed 70% ostial LAD, 50% prox LAD, 30% OM1 and 35% RCA.  He was managed medically.  He subsequently had an echo 09/2014 that showed LVEF 55-60% with mild MR and AR and grade 1 diastolic dysfunction.  At that time atorvastatin was increased to 80 mg but he noted leg pain.  He was seen in the lipid clinic and started back on atorvastatin 40mg  and zetia 10 mg.    Theodore [name] was seen in clinic 09/25/15 and reported significant shortness of breath with minimal exertion.  One year ago he was able to walk a mile and play golf.  Now he has no energy and gets short of breath after walking 200 feet.  He denies exertional chest pain but does note mild chest pain that occurs at rest.  He had a LHC with Dr. Ellyn Hack on 09/28/15 that again revealed a 70% ostial-proximal LAD lesion.  Dr. Ellyn Hack performed FFR on that lesion and there was no significant change.  However, there was some concern regarding the accuracy of the result. The left ventriculogram performed at that time revealed LVEF 35-45%.  Theodore [name] continues to have exertional dyspnea.  He no longer has lower  extremity edema since being on lasix.  He rarely has chest pain and denies orthopnea, PND, palpitations, lightheadedness or dizziness. Theodore [name] is concerned that this LAD stenosis is the cause of his symptoms.   Past Medical History:  Diagnosis Date  . History of arthritis   . History of rheumatic fever   . Hypercholesterolemia   . Hypertension   . Nocturia   . PAF (paroxysmal atrial fibrillation) [facility]) January 2013   Placed on Pradaxa. Did not require cardioversion; spontaneously converted  . Peripheral edema   . Right bundle branch block   . SOB (shortness of breath)   . Tendonitis of elbow, left     Past Surgical History:  Procedure Laterality Date  . BACK SURGERY  12/24/09   fusion C3-C4  . BACK SURGERY  2010   fusion L4-L5  . CARDIAC CATHETERIZATION  2009   NONOBSTRUCTIVE ATHERSCLEROTIC CORONARY DISEASE AND NORMAL  LV FUNCTION  . CARDIAC CATHETERIZATION N/A 09/08/2014   Procedure: Left Heart Cath and Coronary Angiography;  Surgeon: Belva Crome, MD;  Location: [facility];  Service: Cardiovascular;  Laterality: N/A;  . CARDIAC CATHETERIZATION N/A 09/28/2015   Procedure: Left Heart Cath and Coronary Angiography;  Surgeon: Leonie Man, MD;  Location: [facility];  Service: Cardiovascular;  Laterality: N/A;  . CARDIAC CATHETERIZATION N/A 09/28/2015  Procedure: Intravascular Pressure Wire/FFR Study;  Surgeon: Leonie Man, MD;  Location: [place];  Service: Cardiovascular;  Laterality: N/A;  . COLONOSCOPY  2013   per patient, rpt 5 yrs  . KNEE SURGERY Right 2007  . PATELLAR TENDON REPAIR Left 2008  . TRICEPS TENDON REPAIR Left 2013     Current Outpatient Prescriptions  Medication Sig Dispense Refill  . Ascorbic Acid (VITAMIN C PO) Take 1 tablet by mouth daily.     [name] aspirin 81 MG chewable tablet Chew 81 mg by mouth daily.    [name] atorvastatin (LIPITOR) 40 MG tablet Take 1 tablet (40 mg total) by mouth daily. 90 tablet 3  . Cholecalciferol  (VITAMIN D3) 2000 units TABS Take 2,000 Units by mouth daily.    [name] diltiazem (CARTIA XT) 180 MG 24 hr capsule Take 1 capsule (180 mg total) by mouth daily. 90 capsule 1  . ezetimibe (ZETIA) 10 MG tablet Take 1 tablet (10 mg total) by mouth daily. 90 tablet 3  . furosemide (LASIX) 20 MG tablet Take 1 tablet (20 mg total) by mouth daily. 90 tablet 3  . KLOR-CON M10 10 MEQ tablet TAKE 1 TABLET TWICE A DAY (NEW DOSE) 180 tablet 1  . losartan (COZAAR) 100 MG tablet Take 1 tablet (100 mg total) by mouth daily. 90 tablet 1  . LYSINE PO Take 1 tablet by mouth daily.     . metoprolol (LOPRESSOR) 50 MG tablet Take 75 mg by mouth 2 (two) times daily.     . naproxen sodium (ANAPROX) 220 MG tablet Take 220 mg by mouth daily.     [name] omeprazole (PRILOSEC) 20 MG capsule Take 20 mg by mouth daily.      . rivaroxaban (XARELTO) 20 MG TABS tablet Take 1 tablet (20 mg total) by mouth daily with supper. 90 tablet 1  . Tiotropium Bromide Monohydrate (SPIRIVA RESPIMAT) 1.25 MCG/ACT AERS Inhale 2 puffs into the lungs daily. 3 Inhaler 3   No current facility-administered medications for this visit.     Allergies:   Penicillins; Amlodipine; Lisinopril; Zocor [simvastatin]; and Tamiflu    Social History:  The patient  reports that he has never smoked. He has never used smokeless tobacco. He reports that he drinks about 1.2 oz of alcohol per week . He reports that he does not use drugs.   Family History:  The patient's family history includes Asthma in his sister; COPD in his brother and father; Heart attack in his father; Heart disease in his brother; Hypertension in his father.    ROS:  Please see the history of present illness.   Otherwise, review of systems are positive for none.   All other systems are reviewed and negative.    PHYSICAL EXAM: VS:  BP 140/86   Pulse 70   Ht 6' (1.829 m)   Wt 224 lb 9.6 oz (101.9 kg)   BMI 30.46 kg/m  , BMI Body mass index is 30.46 kg/m. GENERAL:  Well appearing HEENT:   Pupils equal round and reactive, fundi not visualized, oral mucosa unremarkable NECK:  No jugular venous distention, waveform within normal limits, carotid upstroke brisk and symmetric, no bruits, no thyromegaly LYMPHATICS:  No cervical adenopathy LUNGS:  Clear to auscultation bilaterally HEART:  Irregularly irregular.  PMI not displaced or sustained,S1 and S2 within normal limits, no S3, no S4, no clicks, no rubs, no  murmurs ABD:  Flat, positive bowel sounds normal in frequency in pitch, no bruits, no rebound,  no guarding, no midline pulsatile mass, no hepatomegaly, no splenomegaly EXT:  2 plus pulses throughout, no edema, no cyanosis no clubbing SKIN:  No rashes no nodules NEURO:  Cranial nerves II through XII grossly intact, motor grossly intact throughout PSYCH:  Cognitively intact, oriented to person place and time   EKG:  EKG is not ordered today. The ekg ordered 09/25/15 demonstrates atrial fibrillation.  Rate 84 bpm.   LHC 09/28/15:  Ostial to proximal LAD stenoses of this at least moderately calcified. 70% focal stenosis followed by diffuse 50% stenosis.  FFR evaluation of the combination of the LAD lesions showed no significant drop in baseline FFR from 0.96 down to 0.95. This suggests physiologically not significant lesion.  There is moderate left ventricular systolic dysfunction. The left ventricular ejection fraction is 35-45% by visual estimate.  LV end diastolic pressure is moderately elevated.  LHC 09/08/14:   1. 1st Mrg lesion, 30% stenosed. 2. Prox LAD lesion, 50% stenosed. 3. Ost LAD lesion, 70% stenosed. 4. Prox RCA to Dist RCA lesion, 35% stenosed.   50-70% ostial/proximal LAD stenosis within the heavily calcified segment. Recent myocardial perfusion energy and within the past week did not demonstrate anterior ischemia.  Heavily calcified but widely patent circumflex and right coronary with luminal irregularities noted in the proximal mid and distal  segments.  Mild global left ventricular dysfunction with an ejection fraction in the 45-50% range   Recommendations:   The patient's symptoms are mixed. With exertion he experiences dyspnea. At rest and randomly he has neck jaw and left elbow discomfort. When both the angiogram and the myocardial perfusion study were considered together, no justification for percutaneous intervention on the LAD could be made.  Close clinical follow-up  Aggressive risk factor modification    Recent Labs: 07/11/2015: ALT 15 08/07/2015: Pro B Natriuretic peptide (BNP) 232.0; TSH 1.64 09/25/2015: BUN 18; Creat 1.09; Hemoglobin 15.3; Platelets 155; Potassium 4.9; Sodium 137    Lipid Panel    Component Value Date/Time   CHOL 144 07/11/2015 0956   TRIG 84 07/11/2015 0956   HDL 57 07/11/2015 0956   CHOLHDL 2.5 07/11/2015 0956   VLDL 17 07/11/2015 0956   LDLCALC 70 07/11/2015 0956      Wt Readings from Last 3 Encounters:  10/08/15 224 lb 9.6 oz (101.9 kg)  09/28/15 225 lb (102.1 kg)  09/25/15 226 lb (102.5 kg)      ASSESSMENT AND PLAN:  # CAD: # Exertional dyspnea: Mr. Moxey has exertional dyspnea and a known, 70% ostial LAD lesion.  His stress test last year did not show ischemia in that region and the FFR from earlier this month was negative.  The significance of the FFR results are unclear.  I reviewed the cath images and discussed the results with Dr. Ellyn Hack, who notes that because he had the expected increase in heart rate, the test was accurate.  He continues to have exertional symptoms that seem to be consistent with angina. Therefore, we will obtain a Lexiscan Myoview to determine whether he has anterior ischemia associated with his ostial to proximal 70% LAD lesion. If this is also negative for ischemia, we will consider other etiologies such as gastroesophageal reflux disease and esophageal spasm.  He is also in atrial fibrillation, which could be causing his symptoms.  Finally, his  LVEF was reduced by LVgram.  We will obtain an echo to better evaluate.  # Chronic systolic and diastolic heart failure: LVEF on LHC Was 35-45%. His echo 10/09/14 revealed  normal systolic function. We will obtain an echocardiogram to better evaluate. He appears to be euvolemic on exam. Continue metoprolol and furosemide.  # Persistent atrial fibrillation: Mr. Nessmith is currently in atrial fibrillation, though rates are well-controlled.  Continue Xarelto, diltiazem and metoprolol. This patients CHA2DS2-VASc Score and unadjusted Ischemic Stroke Rate (% per year) is equal to 3.2 % stroke rate/year from a score of 3  Above score calculated as 1 point each if present [CHF, HTN, DM, Vascular=MI/PAD/Aortic Plaque, Age if 65-74, or Male] Above score calculated as 2 points each if present [Age > 75, or Stroke/TIA/TE]   # Hypertension: Blood pressure is well-controlled.  Continue metoprolol and diltiazem.  # Hyperlipidemia: Continue atorvastatin and Zetia.  LDL 70 06/2015.   Current medicines are reviewed at length with the patient today.  The patient does not have concerns regarding medicines.  The following changes have been made:  None  Labs/ tests ordered today include:  No orders of the defined types were placed in this encounter.    Disposition:   FU with Myson Levi C. [name], MD, [facility] in 1 month.    This note was written with the assistance of speech recognition software.  Please excuse any transcriptional errors.  Signed, Shaneen Reeser C. [name], MD, [facility]  10/08/2015 10:27 AM    [place]

## 2015-10-08 NOTE — Patient Instructions (Signed)
Medication Instructions:  Your physician recommends that you continue on your current medications as directed. Please refer to the Current Medication list given to you today.  Labwork: none  Testing/Procedures: Your physician has requested that you have an echocardiogram. Echocardiography is a painless test that uses sound waves to create images of your heart. It provides your doctor with information about the size and shape of your heart and how well your heart's chambers and valves are working. This procedure takes approximately one hour. There are no restrictions for this procedure.  Your physician has requested that you have a lexiscan myoview. For further information please visit [email or web address]. Please follow instruction sheet, as given.  Follow-Up: Keep as scheduled   If you need a refill on your cardiac medications before your next appointment, please call your pharmacy.

## 2015-10-09 ENCOUNTER — Encounter: Payer: Self-pay | Admitting: Cardiovascular Disease

## 2015-10-09 ENCOUNTER — Ambulatory Visit: Payer: Medicare Other | Admitting: Cardiovascular Disease

## 2015-10-12 ENCOUNTER — Telehealth (HOSPITAL_COMMUNITY): Payer: Self-pay

## 2015-10-12 NOTE — Telephone Encounter (Signed)
Encounter complete. 

## 2015-10-17 ENCOUNTER — Ambulatory Visit (HOSPITAL_COMMUNITY)
Admission: RE | Admit: 2015-10-17 | Discharge: 2015-10-17 | Disposition: A | Discharge status: Home / Self Care | Payer: Medicare Other | Source: Ambulatory Visit | Attending: Cardiovascular Disease | Admitting: Cardiovascular Disease

## 2015-10-17 DIAGNOSIS — R0609 Other forms of dyspnea: Secondary | ICD-10-CM | POA: Insufficient documentation

## 2015-10-17 DIAGNOSIS — R5383 Other fatigue: Secondary | ICD-10-CM | POA: Insufficient documentation

## 2015-10-17 DIAGNOSIS — R9439 Abnormal result of other cardiovascular function study: Secondary | ICD-10-CM | POA: Insufficient documentation

## 2015-10-17 DIAGNOSIS — I208 Other forms of angina pectoris: Secondary | ICD-10-CM | POA: Insufficient documentation

## 2015-10-17 DIAGNOSIS — I5042 Chronic combined systolic (congestive) and diastolic (congestive) heart failure: Secondary | ICD-10-CM | POA: Diagnosis not present

## 2015-10-17 DIAGNOSIS — J449 Chronic obstructive pulmonary disease, unspecified: Secondary | ICD-10-CM | POA: Insufficient documentation

## 2015-10-17 DIAGNOSIS — R0602 Shortness of breath: Secondary | ICD-10-CM | POA: Diagnosis not present

## 2015-10-17 DIAGNOSIS — I2510 Atherosclerotic heart disease of native coronary artery without angina pectoris: Secondary | ICD-10-CM | POA: Insufficient documentation

## 2015-10-17 DIAGNOSIS — Z8249 Family history of ischemic heart disease and other diseases of the circulatory system: Secondary | ICD-10-CM | POA: Insufficient documentation

## 2015-10-17 DIAGNOSIS — I110 Hypertensive heart disease with heart failure: Secondary | ICD-10-CM | POA: Diagnosis not present

## 2015-10-17 DIAGNOSIS — I209 Angina pectoris, unspecified: Secondary | ICD-10-CM | POA: Diagnosis not present

## 2015-10-17 LAB — MYOCARDIAL PERFUSION IMAGING
CHL CUP NUCLEAR SDS: 0
CHL CUP NUCLEAR SSS: 2
CSEPPHR: 74 {beats}/min
LV dias vol: 157 mL (ref 62–150)
LV sys vol: 96 mL
NUC STRESS TID: 1.21
Rest HR: 71 {beats}/min
SRS: 2

## 2015-10-17 MED ORDER — TECHNETIUM TC 99M TETROFOSMIN IV KIT
30.8000 | PACK | Freq: Once | INTRAVENOUS | Status: AC | PRN
  Administered 2015-10-17: 30.8 via INTRAVENOUS
  Filled 2015-10-17: qty 31

## 2015-10-17 MED ORDER — REGADENOSON 0.4 MG/5ML IV SOLN
0.4000 mg | Freq: Once | INTRAVENOUS | Status: AC
  Administered 2015-10-17: 0.4 mg via INTRAVENOUS

## 2015-10-17 MED ORDER — AMINOPHYLLINE 25 MG/ML IV SOLN
75.0000 mg | Freq: Once | INTRAVENOUS | Status: AC
  Administered 2015-10-17: 75 mg via INTRAVENOUS

## 2015-10-17 MED ORDER — TECHNETIUM TC 99M TETROFOSMIN IV KIT
10.5000 | PACK | Freq: Once | INTRAVENOUS | Status: AC | PRN
  Administered 2015-10-17: 10.5 via INTRAVENOUS
  Filled 2015-10-17: qty 11

## 2015-10-18 ENCOUNTER — Encounter: Payer: Self-pay | Admitting: *Deleted

## 2015-10-18 ENCOUNTER — Telehealth: Payer: Self-pay | Admitting: *Deleted

## 2015-10-18 DIAGNOSIS — I480 Paroxysmal atrial fibrillation: Secondary | ICD-10-CM

## 2015-10-18 NOTE — Telephone Encounter (Signed)
Patient scheduled for cardioversion with Dr Stanford Breed 10/23/15 at 10:00 am Advised patient and will go for labs today Keep Dr [name] follow up as scheduled

## 2015-10-18 NOTE — Telephone Encounter (Signed)
-----   Message from Skeet Latch, MD sent at 10/17/2015  5:32 PM EDT ----- Stress test is unchanged from before and does not show a lack of blood flow in the left anterior descending artery. Will plan for DCCV next week, as his shortness of breath may be due to atrial fibrillation rather than ischemia.  We will cancel his echo.

## 2015-10-19 LAB — BASIC METABOLIC PANEL
BUN: 15 mg/dL (ref 7–25)
CALCIUM: 9.2 mg/dL (ref 8.6–10.3)
CO2: 25 mmol/L (ref 20–31)
CREATININE: 1.06 mg/dL (ref 0.70–1.18)
Chloride: 104 mmol/L (ref 98–110)
GLUCOSE: 103 mg/dL — AB (ref 65–99)
Potassium: 4.6 mmol/L (ref 3.5–5.3)
SODIUM: 138 mmol/L (ref 135–146)

## 2015-10-19 LAB — CBC WITH DIFFERENTIAL/PLATELET
BASOS ABS: 0 {cells}/uL (ref 0–200)
Basophils Relative: 0 %
EOS ABS: 57 {cells}/uL (ref 15–500)
EOS PCT: 1 %
HCT: 43.3 % (ref 38.5–50.0)
HEMOGLOBIN: 14.6 g/dL (ref 13.2–17.1)
Lymphocytes Relative: 37 %
Lymphs Abs: 2109 cells/uL (ref 850–3900)
MCH: 30.6 pg (ref 27.0–33.0)
MCHC: 33.7 g/dL (ref 32.0–36.0)
MCV: 90.8 fL (ref 80.0–100.0)
MONOS PCT: 11 %
MPV: 9.9 fL (ref 7.5–12.5)
Monocytes Absolute: 627 cells/uL (ref 200–950)
NEUTROS PCT: 51 %
Neutro Abs: 2907 cells/uL (ref 1500–7800)
PLATELETS: 141 10*3/uL (ref 140–400)
RBC: 4.77 MIL/uL (ref 4.20–5.80)
RDW: 20.1 % — ABNORMAL HIGH (ref 11.0–15.0)
WBC: 5.7 10*3/uL (ref 3.8–10.8)

## 2015-10-22 ENCOUNTER — Other Ambulatory Visit (HOSPITAL_COMMUNITY): Payer: Medicare Other

## 2015-10-23 ENCOUNTER — Ambulatory Visit (HOSPITAL_COMMUNITY): Payer: Medicare Other | Admitting: Certified Registered Nurse Anesthetist

## 2015-10-23 ENCOUNTER — Ambulatory Visit (HOSPITAL_COMMUNITY)
Admission: RE | Admit: 2015-10-23 | Discharge: 2015-10-23 | Disposition: A | Discharge status: Home / Self Care | Payer: Medicare Other | Source: Ambulatory Visit | Attending: Cardiology | Admitting: Cardiology

## 2015-10-23 ENCOUNTER — Encounter (HOSPITAL_COMMUNITY): Payer: Self-pay

## 2015-10-23 ENCOUNTER — Encounter (HOSPITAL_COMMUNITY)
Admission: RE | Disposition: A | Discharge status: Home / Self Care | Payer: Self-pay | Source: Ambulatory Visit | Attending: Cardiology

## 2015-10-23 ENCOUNTER — Other Ambulatory Visit: Payer: Self-pay | Admitting: Cardiology

## 2015-10-23 DIAGNOSIS — J45909 Unspecified asthma, uncomplicated: Secondary | ICD-10-CM | POA: Insufficient documentation

## 2015-10-23 DIAGNOSIS — Z7982 Long term (current) use of aspirin: Secondary | ICD-10-CM | POA: Insufficient documentation

## 2015-10-23 DIAGNOSIS — E7800 Pure hypercholesterolemia, unspecified: Secondary | ICD-10-CM | POA: Diagnosis not present

## 2015-10-23 DIAGNOSIS — Z791 Long term (current) use of non-steroidal anti-inflammatories (NSAID): Secondary | ICD-10-CM | POA: Diagnosis not present

## 2015-10-23 DIAGNOSIS — K219 Gastro-esophageal reflux disease without esophagitis: Secondary | ICD-10-CM | POA: Diagnosis not present

## 2015-10-23 DIAGNOSIS — I10 Essential (primary) hypertension: Secondary | ICD-10-CM | POA: Diagnosis not present

## 2015-10-23 DIAGNOSIS — Z7901 Long term (current) use of anticoagulants: Secondary | ICD-10-CM | POA: Diagnosis not present

## 2015-10-23 DIAGNOSIS — E785 Hyperlipidemia, unspecified: Secondary | ICD-10-CM | POA: Insufficient documentation

## 2015-10-23 DIAGNOSIS — I2510 Atherosclerotic heart disease of native coronary artery without angina pectoris: Secondary | ICD-10-CM | POA: Diagnosis not present

## 2015-10-23 DIAGNOSIS — Z79899 Other long term (current) drug therapy: Secondary | ICD-10-CM | POA: Insufficient documentation

## 2015-10-23 DIAGNOSIS — I481 Persistent atrial fibrillation: Secondary | ICD-10-CM | POA: Insufficient documentation

## 2015-10-23 DIAGNOSIS — I480 Paroxysmal atrial fibrillation: Secondary | ICD-10-CM | POA: Insufficient documentation

## 2015-10-23 HISTORY — PX: CARDIOVERSION: SHX1299

## 2015-10-23 SURGERY — CARDIOVERSION
Anesthesia: Monitor Anesthesia Care

## 2015-10-23 MED ORDER — SODIUM CHLORIDE 0.9 % IV SOLN: 250.0000 mL | INTRAVENOUS | Status: DC

## 2015-10-23 MED ORDER — SODIUM CHLORIDE 0.9 % IV SOLN
INTRAVENOUS | Status: DC
  Administered 2015-10-23: 500 mL via INTRAVENOUS

## 2015-10-23 MED ORDER — SODIUM CHLORIDE 0.9 % IV SOLN
INTRAVENOUS | Status: DC | PRN
  Administered 2015-10-23: 10:00:00 via INTRAVENOUS

## 2015-10-23 MED ORDER — SODIUM CHLORIDE 0.9% FLUSH: 3.0000 mL | INTRAVENOUS | Status: DC | PRN

## 2015-10-23 MED ORDER — LIDOCAINE HCL (CARDIAC) 20 MG/ML IV SOLN: INTRAVENOUS | Status: DC | PRN

## 2015-10-23 MED ORDER — SODIUM CHLORIDE 0.9% FLUSH: 3.0000 mL | Freq: Two times a day (BID) | INTRAVENOUS | Status: DC

## 2015-10-23 MED ORDER — LIDOCAINE 2% (20 MG/ML) 5 ML SYRINGE
INTRAMUSCULAR | Status: DC | PRN
  Administered 2015-10-23: 40 mg via INTRAVENOUS

## 2015-10-23 MED ORDER — PROPOFOL 10 MG/ML IV BOLUS
INTRAVENOUS | Status: DC | PRN
  Administered 2015-10-23: 70 mg via INTRAVENOUS
  Administered 2015-10-23: 20 mg via INTRAVENOUS

## 2015-10-23 NOTE — Discharge Instructions (Signed)
Electrical Cardioversion, Care After °Refer to this sheet in the next few weeks. These instructions provide you with information on caring for yourself after your procedure. Your health care provider may also give you more specific instructions. Your treatment has been planned according to current medical practices, but problems sometimes occur. Call your health care provider if you have any problems or questions after your procedure. °WHAT TO EXPECT AFTER THE PROCEDURE °After your procedure, it is typical to have the following sensations: °· Some redness on the skin where the shocks were delivered. If this is tender, a sunburn lotion or hydrocortisone cream may help. °· Possible return of an abnormal heart rhythm within hours or days after the procedure. °HOME CARE INSTRUCTIONS °· Take medicines only as directed by your health care provider. Be sure you understand how and when to take your medicine. °· Learn how to feel your pulse and check it often. °· Limit your activity for 48 hours after the procedure or as directed by your health care provider. °· Avoid or minimize caffeine and other stimulants as directed by your health care provider. °SEEK MEDICAL CARE IF: °· You feel like your heart is beating too fast or your pulse is not regular. °· You have any questions about your medicines. °· You have bleeding that will not stop. °SEEK IMMEDIATE MEDICAL CARE IF: °· You are dizzy or feel faint. °· It is hard to breathe or you feel short of breath. °· There is a change in discomfort in your chest. °· Your speech is slurred or you have trouble moving an arm or leg on one side of your body. °· You get a serious muscle cramp that does not go away. °· Your fingers or toes turn cold or blue. °  °This information is not intended to replace advice given to you by your health care provider. Make sure you discuss any questions you have with your health care provider. °  °Document Released: 11/17/2012 Document Revised: 02/17/2014  Document Reviewed: 11/17/2012 °Elsevier Interactive Patient Education ©2016 [employer] ° °

## 2015-10-23 NOTE — Transfer of Care (Signed)
Immediate Anesthesia Transfer of Care Note  Patient: Theodore [name]  Procedure(s) Performed: Procedure(s): CARDIOVERSION (N/A)  Patient Location: Endoscopy Unit  Anesthesia Type:General  Level of Consciousness: awake and alert   Airway & Oxygen Therapy: Patient Spontanous Breathing and Patient connected to nasal cannula oxygen  Post-op Assessment: Report given to RN, Post -op Vital signs reviewed and stable and Patient moving all extremities X 4  Post vital signs: Reviewed and stable  Last Vitals:  Vitals:   10/23/15 0837 10/23/15 0954  BP: (!) 160/106 (!) 171/113  Pulse: 66   Resp: 14 18  Temp: 36.7 C     Last Pain:  Vitals:   10/23/15 0837  TempSrc: Oral         Complications: No apparent anesthesia complications

## 2015-10-23 NOTE — Anesthesia Procedure Notes (Signed)
Procedure Name: MAC Date/Time: 10/23/2015 10:03 AM Performed by: Garrison Columbus T Pre-anesthesia Checklist: Patient identified, Emergency Drugs available, Suction available and Patient being monitored Patient Re-evaluated:Patient Re-evaluated prior to inductionOxygen Delivery Method: Ambu bag Preoxygenation: Pre-oxygenation with 100% oxygen Intubation Type: IV induction Ventilation: Mask ventilation without difficulty Placement Confirmation: positive ETCO2 and breath sounds checked- equal and bilateral Dental Injury: Teeth and Oropharynx as per pre-operative assessment

## 2015-10-23 NOTE — Anesthesia Preprocedure Evaluation (Addendum)
Anesthesia Evaluation  Patient identified by MRN, date of birth, ID band Patient awake    Reviewed: Allergy & Precautions, NPO status , Patient's Chart, lab work & pertinent test results  Airway Mallampati: II  TM Distance: >3 FB Neck ROM: Full    Dental  (+) Teeth Intact, Dental Advisory Given   Pulmonary shortness of breath, asthma ,    + rhonchi        Cardiovascular hypertension, Pt. on medications and Pt. on home beta blockers + CAD  + dysrhythmias Atrial Fibrillation  Rhythm:Irregular     Neuro/Psych    GI/Hepatic GERD  Medicated,  Endo/Other    Renal/GU      Musculoskeletal  (+) Arthritis ,   Abdominal   Peds  Hematology   Anesthesia Other Findings   Reproductive/Obstetrics                            Anesthesia Physical Anesthesia Plan  ASA: III  Anesthesia Plan: General   Post-op Pain Management:    Induction: Intravenous  Airway Management Planned: Simple Face Mask  Additional Equipment:   Intra-op Plan:   Post-operative Plan:   Informed Consent: I have reviewed the patients History and Physical, chart, labs and discussed the procedure including the risks, benefits and alternatives for the proposed anesthesia with the patient or authorized representative who has indicated his/her understanding and acceptance.   Dental advisory given  Plan Discussed with: CRNA, Anesthesiologist and Surgeon  Anesthesia Plan Comments:         Anesthesia Quick Evaluation

## 2015-10-23 NOTE — Procedures (Signed)
Electrical Cardioversion Procedure Note Theodore [name] [id number] 1935-02-05  Procedure: Electrical Cardioversion Indications:  Atrial Fibrillation  Procedure Details Consent: Risks of procedure as well as the alternatives and risks of each were explained to the (patient/caregiver).  Consent for procedure obtained. Time Out: Verified patient identification, verified procedure, site/side was marked, verified correct patient position, special equipment/implants available, medications/allergies/relevent history reviewed, required imaging and test results available.  Performed  Patient placed on cardiac monitor, pulse oximetry, supplemental oxygen as necessary.  Sedation given: Patient sedated by anesthesia with lidocaine 40 mg and diprovan 90 mg IV. Pacer pads placed anterior and posterior chest.  Cardioverted 2 time(s).  Cardioverted at 120J and 150J; patient developed sinus with initial DCCV followed by recurrent atrial fibrillation; sinus reestablished with second DCCV.  Evaluation Complications: None Patient did tolerate procedure well. Continue xarelto  Theodore [name] 10/23/2015, 9:34 AM

## 2015-10-23 NOTE — H&P (Signed)
SHYKEIM SOLE  10/08/2015 10:15 AM  Office Visit  MRN:  [id number]  Description: Male DOB: 05-Sep-1934 Provider: Skeet Latch, MD Department: [facility]  Vitals   BP  140/86   Pulse  70   Ht  6' (1.829 m)   Wt  224 lb 9.6 oz (101.9 kg)   BMI  30.46 kg/m      Vitals History  Progress Notes   Skeet Latch, MD at 10/08/2015 10:15 AM   Status: Signed  Expand All Collapse All      Cardiology Office Note   Date:  10/08/2015   ID:  Theodore [name], DOB 08-Mar-1934, MRN [id number]  PCP:  [name], MD               Cardiologist:   Skeet Latch, MD       Chief Complaint  Patient presents with  . Follow-up    pt states sob or tireness has not improved from the last visit. Pt states no other Sx. or concernsa      History of Present Illness: Theodore [name] is a 81 y.o. male with hypertension, hyperlipidemia, paroxysmal atrial fibrillation, asthma and Rheumatic fever who presents for follow up.  Theodore [name] was previously a patient of Dr. Mare Ferrari.  Theodore [name] was diagnosed with atrial fibrillation in 2014.  He was initially on Pradaxa and switched to Xarelto.  He had an episode of chest pain 08/2014 and had a Lexiscan Myoview 08/31/14 that showed an old inferior scar with peri-infarct ischemia and LVEF 49%.  He then had a LHC that revealed 70% ostial LAD, 50% prox LAD, 30% OM1 and 35% RCA.  He was managed medically.  He subsequently had an echo 09/2014 that showed LVEF 55-60% with mild MR and AR and grade 1 diastolic dysfunction.  At that time atorvastatin was increased to 80 mg but he noted leg pain.  He was seen in the lipid clinic and started back on atorvastatin 40mg  and zetia 10 mg.    Theodore [name] was seen in clinic 09/25/15 and reported significant shortness of breath with minimal exertion.  One year ago he was able to walk a mile and play golf.  Now he has no energy and gets short of breath after walking 200 feet.  He denies exertional chest pain but  does note mild chest pain that occurs at rest.  He had a LHC with Dr. Ellyn [name] on 09/28/15 that again revealed a 70% ostial-proximal LAD lesion.  Dr. Ellyn [name] performed FFR on that lesion and there was no significant change.  However, there was some concern regarding the accuracy of the result. The left ventriculogram performed at that time revealed LVEF 35-45%.  Theodore [name] continues to have exertional dyspnea.  He no longer has lower extremity edema since being on lasix.  He rarely has chest pain and denies orthopnea, PND, palpitations, lightheadedness or dizziness. Theodore [name] is concerned that this LAD stenosis is the cause of his symptoms.       Past Medical History:  Diagnosis Date  . History of arthritis   . History of rheumatic fever   . Hypercholesterolemia   . Hypertension   . Nocturia   . PAF (paroxysmal atrial fibrillation) [facility]) January 2013   Placed on Pradaxa. Did not require cardioversion; spontaneously converted  . Peripheral edema   . Right bundle branch block   . SOB (shortness of breath)   . Tendonitis of elbow, left  Past Surgical History:  Procedure Laterality Date  . BACK SURGERY  12/24/09   fusion C3-C4  . BACK SURGERY  2010   fusion L4-L5  . CARDIAC CATHETERIZATION  2009   NONOBSTRUCTIVE ATHERSCLEROTIC CORONARY DISEASE AND NORMAL  LV FUNCTION  . CARDIAC CATHETERIZATION N/A 09/08/2014   Procedure: Left Heart Cath and Coronary Angiography;  Surgeon: Belva Crome, MD;  Location: [place] [facility];  Service: Cardiovascular;  Laterality: N/A;  . CARDIAC CATHETERIZATION N/A 09/28/2015   Procedure: Left Heart Cath and Coronary Angiography;  Surgeon: Leonie Man, MD;  Location: [place] [facility];  Service: Cardiovascular;  Laterality: N/A;  . CARDIAC CATHETERIZATION N/A 09/28/2015   Procedure: Intravascular Pressure Wire/FFR Study;  Surgeon: Leonie Man, MD;  Location: [place] [facility];  Service: Cardiovascular;  Laterality: N/A;  .  COLONOSCOPY  2013   per patient, rpt 5 yrs  . KNEE SURGERY Right 2007  . PATELLAR TENDON REPAIR Left 2008  . TRICEPS TENDON REPAIR Left 2013           Current Outpatient Prescriptions  Medication Sig Dispense Refill  . Ascorbic Acid (VITAMIN C PO) Take 1 tablet by mouth daily.     [name] aspirin 81 MG chewable tablet Chew 81 mg by mouth daily.    [name] atorvastatin (LIPITOR) 40 MG tablet Take 1 tablet (40 mg total) by mouth daily. 90 tablet 3  . Cholecalciferol (VITAMIN D3) 2000 units TABS Take 2,000 Units by mouth daily.    [name] diltiazem (CARTIA XT) 180 MG 24 hr capsule Take 1 capsule (180 mg total) by mouth daily. 90 capsule 1  . ezetimibe (ZETIA) 10 MG tablet Take 1 tablet (10 mg total) by mouth daily. 90 tablet 3  . furosemide (LASIX) 20 MG tablet Take 1 tablet (20 mg total) by mouth daily. 90 tablet 3  . KLOR-CON M10 10 MEQ tablet TAKE 1 TABLET TWICE A DAY (NEW DOSE) 180 tablet 1  . losartan (COZAAR) 100 MG tablet Take 1 tablet (100 mg total) by mouth daily. 90 tablet 1  . LYSINE PO Take 1 tablet by mouth daily.     . metoprolol (LOPRESSOR) 50 MG tablet Take 75 mg by mouth 2 (two) times daily.     . naproxen sodium (ANAPROX) 220 MG tablet Take 220 mg by mouth daily.     [name] omeprazole (PRILOSEC) 20 MG capsule Take 20 mg by mouth daily.      . rivaroxaban (XARELTO) 20 MG TABS tablet Take 1 tablet (20 mg total) by mouth daily with supper. 90 tablet 1  . Tiotropium Bromide Monohydrate (SPIRIVA RESPIMAT) 1.25 MCG/ACT AERS Inhale 2 puffs into the lungs daily. 3 Inhaler 3   No current facility-administered medications for this visit.     Allergies:   Penicillins; Amlodipine; Lisinopril; Zocor [simvastatin]; and Tamiflu    Social History:  The patient  reports that he has never smoked. He has never used smokeless tobacco. He reports that he drinks about 1.2 oz of alcohol per week . He reports that he does not use drugs.   Family History:  The patient's family history  includes Asthma in his sister; COPD in his brother and father; Heart attack in his father; Heart disease in his brother; Hypertension in his father.    ROS:  Please see the history of present illness.   Otherwise, review of systems are positive for none.   All other systems are reviewed and negative.    PHYSICAL EXAM: VS:  BP 140/86   Pulse 70   Ht 6' (1.829 m)   Wt 224 lb 9.6 oz (101.9 kg)   BMI 30.46 kg/m  , BMI Body mass index is 30.46 kg/m. GENERAL:  Well appearing HEENT:  Pupils equal round and reactive, fundi not visualized, oral mucosa unremarkable NECK:  No jugular venous distention, waveform within normal limits, carotid upstroke brisk and symmetric, no bruits, no thyromegaly LYMPHATICS:  No cervical adenopathy LUNGS:  Clear to auscultation bilaterally HEART:  Irregularly irregular.  PMI not displaced or sustained,S1 and S2 within normal limits, no S3, no S4, no clicks, no rubs, no  murmurs ABD:  Flat, positive bowel sounds normal in frequency in pitch, no bruits, no rebound, no guarding, no midline pulsatile mass, no hepatomegaly, no splenomegaly EXT:  2 plus pulses throughout, no edema, no cyanosis no clubbing SKIN:  No rashes no nodules NEURO:  Cranial nerves II through XII grossly intact, motor grossly intact throughout PSYCH:  Cognitively intact, oriented to person place and time   EKG:  EKG is not ordered today. The ekg ordered 09/25/15 demonstrates atrial fibrillation.  Rate 84 bpm.   LHC 09/28/15:  Ostial to proximal LAD stenoses of this at least moderately calcified. 70% focal stenosis followed by diffuse 50% stenosis.  FFR evaluation of the combination of the LAD lesions showed no significant drop in baseline FFR from 0.96 down to 0.95. This suggests physiologically not significant lesion.  There is moderate left ventricular systolic dysfunction. The left ventricular ejection fraction is 35-45% by visual estimate.  LV end diastolic pressure is moderately  elevated.  LHC 09/08/14:   1. 1st Mrg lesion, 30% stenosed. 2. Prox LAD lesion, 50% stenosed. 3. Ost LAD lesion, 70% stenosed. 4. Prox RCA to Dist RCA lesion, 35% stenosed.   50-70% ostial/proximal LAD stenosis within the heavily calcified segment. Recent myocardial perfusion energy and within the past week did not demonstrate anterior ischemia.  Heavily calcified but widely patent circumflex and right coronary with luminal irregularities noted in the proximal mid and distal segments.  Mild global left ventricular dysfunction with an ejection fraction in the 45-50% range   Recommendations:   The patient's symptoms are mixed. With exertion he experiences dyspnea. At rest and randomly he has neck jaw and left elbow discomfort. When both the angiogram and the myocardial perfusion study were considered together, no justification for percutaneous intervention on the LAD could be made.  Close clinical follow-up  Aggressive risk factor modification    Recent Labs: 07/11/2015: ALT 15 08/07/2015: Pro B Natriuretic peptide (BNP) 232.0; TSH 1.64 09/25/2015: BUN 18; Creat 1.09; Hemoglobin 15.3; Platelets 155; Potassium 4.9; Sodium 137    Lipid Panel Labs (Brief)          Component Value Date/Time   CHOL 144 07/11/2015 0956   TRIG 84 07/11/2015 0956   HDL 57 07/11/2015 0956   CHOLHDL 2.5 07/11/2015 0956   VLDL 17 07/11/2015 0956   LDLCALC 70 07/11/2015 0956           Wt Readings from Last 3 Encounters:  10/08/15 224 lb 9.6 oz (101.9 kg)  09/28/15 225 lb (102.1 kg)  09/25/15 226 lb (102.5 kg)      ASSESSMENT AND PLAN:  # CAD: # Exertional dyspnea: Theodore [name] has exertional dyspnea and a known, 70% ostial LAD lesion.  His stress test last year did not show ischemia in that region and the FFR from earlier this month was negative.  The significance of the FFR results  are unclear.  I reviewed the cath images and discussed the results with Dr. Ellyn [name], who  notes that because he had the expected increase in heart rate, the test was accurate.  He continues to have exertional symptoms that seem to be consistent with angina. Therefore, we will obtain a Lexiscan Myoview to determine whether he has anterior ischemia associated with his ostial to proximal 70% LAD lesion. If this is also negative for ischemia, we will consider other etiologies such as gastroesophageal reflux disease and esophageal spasm.  He is also in atrial fibrillation, which could be causing his symptoms.  Finally, his LVEF was reduced by LVgram.  We will obtain an echo to better evaluate.  # Chronic systolic and diastolic heart failure: LVEF on LHC Was 35-45%. His echo 10/09/14 revealed normal systolic function. We will obtain an echocardiogram to better evaluate. He appears to be euvolemic on exam. Continue metoprolol and furosemide.  # Persistent atrial fibrillation: Theodore [name] is currently in atrial fibrillation, though rates are well-controlled.  Continue Xarelto, diltiazem and metoprolol. This patients CHA2DS2-VASc Score and unadjusted Ischemic Stroke Rate (% per year) is equal to 3.2 % stroke rate/year from a score of 3  Above score calculated as 1 point each if present [CHF, HTN, DM, Vascular=MI/PAD/Aortic Plaque, Age if 65-74, or Male] Above score calculated as 2 points each if present [Age > 75, or Stroke/TIA/TE]   # Hypertension: Blood pressure is well-controlled.  Continue metoprolol and diltiazem.  # Hyperlipidemia: Continue atorvastatin and Zetia.  LDL 70 06/2015.   Current medicines are reviewed at length with the patient today.  The patient does not have concerns regarding medicines.  The following changes have been made:  None  Labs/ tests ordered today include:  No orders of the defined types were placed in this encounter.    Disposition:   FU with Tiffany C. [name], MD, [facility] in 1 month.    This note was written with the assistance of speech  recognition software.  Please excuse any transcriptional errors.  Signed, Tiffany C. [name], MD, [facility]  10/08/2015 10:27 AM    [facility]     For DCCV; no changes; pt has been compliant with xarelto. [name], MD

## 2015-10-24 ENCOUNTER — Other Ambulatory Visit: Payer: Self-pay | Admitting: *Deleted

## 2015-10-24 MED ORDER — POTASSIUM CHLORIDE CRYS ER 10 MEQ PO TBCR: EXTENDED_RELEASE_TABLET | ORAL | 3 refills | Status: DC

## 2015-10-24 NOTE — Anesthesia Postprocedure Evaluation (Signed)
Anesthesia Post Note  Patient: Theodore [name]  Procedure(s) Performed: Procedure(s) (LRB): CARDIOVERSION (N/A)  Patient location during evaluation: Endoscopy Anesthesia Type: General Level of consciousness: awake Pain management: pain level controlled Vital Signs Assessment: post-procedure vital signs reviewed and stable Respiratory status: spontaneous breathing Cardiovascular status: stable Postop Assessment: no signs of nausea or vomiting Anesthetic complications: no    Last Vitals:  Vitals:   10/23/15 1020 10/23/15 1030  BP: (!) 168/92 (!) 161/93  Pulse:    Resp:    Temp:      Last Pain:  Vitals:   10/23/15 1016  TempSrc: Axillary                 Margi Edmundson

## 2015-10-25 ENCOUNTER — Ambulatory Visit (INDEPENDENT_AMBULATORY_CARE_PROVIDER_SITE_OTHER): Payer: Medicare Other

## 2015-10-25 DIAGNOSIS — Z23 Encounter for immunization: Secondary | ICD-10-CM | POA: Diagnosis not present

## 2015-10-30 ENCOUNTER — Encounter: Payer: Self-pay | Admitting: Cardiovascular Disease

## 2015-10-30 ENCOUNTER — Ambulatory Visit (INDEPENDENT_AMBULATORY_CARE_PROVIDER_SITE_OTHER): Payer: Medicare Other | Admitting: Cardiovascular Disease

## 2015-10-30 VITALS — BP 132/72 | HR 60 | Ht 72.0 in | Wt 224.0 lb

## 2015-10-30 DIAGNOSIS — Z79899 Other long term (current) drug therapy: Secondary | ICD-10-CM

## 2015-10-30 DIAGNOSIS — I480 Paroxysmal atrial fibrillation: Secondary | ICD-10-CM

## 2015-10-30 DIAGNOSIS — E785 Hyperlipidemia, unspecified: Secondary | ICD-10-CM

## 2015-10-30 DIAGNOSIS — I10 Essential (primary) hypertension: Secondary | ICD-10-CM | POA: Diagnosis not present

## 2015-10-30 DIAGNOSIS — I5042 Chronic combined systolic (congestive) and diastolic (congestive) heart failure: Secondary | ICD-10-CM

## 2015-10-30 LAB — LIPID PANEL
Cholesterol: 126 mg/dL (ref 125–200)
HDL: 54 mg/dL (ref >=40)
LDL CALC: 59 mg/dL (ref <130)
Total CHOL/HDL Ratio: 2.3 Ratio (ref <=5.0)
Triglycerides: 67 mg/dL (ref <150)
VLDL: 13 mg/dL (ref <30)

## 2015-10-30 MED ORDER — METOPROLOL SUCCINATE ER 100 MG PO TB24: 100.0000 mg | ORAL_TABLET | Freq: Every day | ORAL | 3 refills | Status: DC

## 2015-10-30 MED ORDER — SPIRONOLACTONE 25 MG PO TABS: 12.5000 mg | ORAL_TABLET | Freq: Every day | ORAL | 0 refills | Status: DC

## 2015-10-30 MED ORDER — METOPROLOL SUCCINATE ER 100 MG PO TB24: 100.0000 mg | ORAL_TABLET | Freq: Every day | ORAL | 0 refills | Status: DC

## 2015-10-30 MED ORDER — METOPROLOL SUCCINATE ER 50 MG PO TB24: 50.0000 mg | ORAL_TABLET | Freq: Every day | ORAL | 3 refills | Status: DC

## 2015-10-30 MED ORDER — SPIRONOLACTONE 25 MG PO TABS: 12.5000 mg | ORAL_TABLET | Freq: Every day | ORAL | 3 refills | Status: DC

## 2015-10-30 MED ORDER — METOPROLOL SUCCINATE ER 50 MG PO TB24: 50.0000 mg | ORAL_TABLET | Freq: Every day | ORAL | 0 refills | Status: DC

## 2015-10-30 NOTE — Progress Notes (Signed)
Cardiology Office Note   Date:  10/30/2015   ID:  Theodore [name], DOB [birth year], MRN [id number]  PCP:  [name], MD  Cardiologist:   Skeet Latch, MD   Chief Complaint  Patient presents with  . Appointment    normal [facility], has had some tiredness after work.      History of Present Illness: Theodore [name] is a 81 y.o. male with hypertension, hyperlipidemia, paroxysmal atrial fibrillation, asthma and Rheumatic fever who presents for follow up.  [name] was previously a patient of Dr. Mare Ferrari.  Theodore [name] was diagnosed with atrial fibrillation in 2014.  He was initially on Pradaxa and switched to Xarelto.  He had an episode of chest pain 08/2014 and had a Lexiscan Myoview 08/31/14 that showed an old inferior scar with peri-infarct ischemia and LVEF 49%.  He then had a LHC that revealed 70% ostial LAD, 50% prox LAD, 30% OM1 and 35% RCA.  He was managed medically.  He subsequently had an echo 09/2014 that showed LVEF 55-60% with mild MR and AR and grade 1 diastolic dysfunction.  At that time atorvastatin was increased to 80 mg but he noted leg pain.  He was seen in the lipid clinic and started back on atorvastatin 40mg  and zetia 10 mg.    Mr. Dulac was seen in clinic 09/25/15 and reported significant shortness of breath with minimal exertion.  One year ago he was able to walk a mile and play golf.  Now he has no energy and gets short of breath after walking 200 feet.  He denies exertional chest pain but does note mild chest pain that occurs at rest.  He had a LHC with Dr. Ellyn Hack on 09/28/15 that again revealed a 70% ostial-proximal LAD lesion.  Dr. Ellyn Hack performed FFR on that lesion and there was no significant change.  However, there was some concern regarding the accuracy of the result. The left ventriculogram performed at that time revealed LVEF 35-45%.  Theodore [name] continued to have exertional dyspnea so he was referred for [facility] 10/17/15 that was negative for ischemia. He  subsequently underwent DCCV on 10/23/15.  Since that cardioversion he has been feeling much better. He has noted an improvement in his breathing and he has more energy. However, he is afraid to do much because he thinks that he will go back into atrial fibrillation. He denies lower extremity edema, orthopnea, or PND.   Past Medical History:  Diagnosis Date  . History of arthritis   . History of rheumatic fever   . Hypercholesterolemia   . Hypertension   . Nocturia   . PAF (paroxysmal atrial fibrillation) [facility]) January 2013   Placed on Pradaxa. Did not require cardioversion; spontaneously converted  . Peripheral edema   . Right bundle branch block   . SOB (shortness of breath)   . Tendonitis of elbow, left     Past Surgical History:  Procedure Laterality Date  . BACK SURGERY  12/24/09   fusion C3-C4  . BACK SURGERY  2010   fusion L4-L5  . CARDIAC CATHETERIZATION  2009   NONOBSTRUCTIVE ATHERSCLEROTIC CORONARY DISEASE AND NORMAL  LV FUNCTION  . CARDIAC CATHETERIZATION N/A 09/08/2014   Procedure: Left Heart Cath and Coronary Angiography;  Surgeon: Belva Crome, MD;  Location: [place] CV LAB;  Service: Cardiovascular;  Laterality: N/A;  . CARDIAC CATHETERIZATION N/A 09/28/2015   Procedure: Left Heart Cath and Coronary Angiography;  Surgeon: Theodore Man, MD;  Location:  [place] INVASIVE CV LAB;  Service: Cardiovascular;  Laterality: N/A;  . CARDIAC CATHETERIZATION N/A 09/28/2015   Procedure: Intravascular Pressure Wire/FFR Study;  Surgeon: Theodore Man, MD;  Location: [place] CV LAB;  Service: Cardiovascular;  Laterality: N/A;  . CARDIOVERSION N/A 10/23/2015   Procedure: CARDIOVERSION;  Surgeon: Theodore Perla, MD;  Location: [facility] ENDOSCOPY;  Service: Cardiovascular;  Laterality: N/A;  . COLONOSCOPY  2013   per patient, rpt 5 yrs  . KNEE SURGERY Right 2007  . PATELLAR TENDON REPAIR Left 2008  . TRICEPS TENDON REPAIR Left 2013     Current Outpatient Prescriptions  Medication  Sig Dispense Refill  . Ascorbic Acid (VITAMIN C PO) Take 1 tablet by mouth daily.     [name] atorvastatin (LIPITOR) 40 MG tablet Take 1 tablet (40 mg total) by mouth daily. 90 tablet 3  . Cholecalciferol (VITAMIN D3) 2000 units TABS Take 2,000 Units by mouth daily.    [name] diltiazem (CARTIA XT) 180 MG 24 hr capsule Take 1 capsule (180 mg total) by mouth daily. 90 capsule 1  . ezetimibe (ZETIA) 10 MG tablet Take 1 tablet (10 mg total) by mouth daily. 90 tablet 3  . furosemide (LASIX) 20 MG tablet Take 1 tablet (20 mg total) by mouth daily. 90 tablet 3  . losartan (COZAAR) 100 MG tablet Take 1 tablet (100 mg total) by mouth daily. 90 tablet 1  . LYSINE PO Take 1 tablet by mouth daily.     . naproxen sodium (ANAPROX) 220 MG tablet Take 220 mg by mouth daily.     [name] omeprazole (PRILOSEC) 20 MG capsule Take 20 mg by mouth daily.      . rivaroxaban (XARELTO) 20 MG TABS tablet Take 1 tablet (20 mg total) by mouth daily with supper. 90 tablet 1  . Tiotropium Bromide Monohydrate (SPIRIVA RESPIMAT) 1.25 MCG/ACT AERS Inhale 2 puffs into the lungs daily. 3 Inhaler 3  . metoprolol succinate (TOPROL-XL) 100 MG 24 hr tablet Take 1 tablet (100 mg total) by mouth daily. Take with or immediately following a meal. 30 tablet 0  . metoprolol succinate (TOPROL-XL) 50 MG 24 hr tablet Take 1 tablet (50 mg total) by mouth daily. Take with or immediately following a meal. 30 tablet 0  . spironolactone (ALDACTONE) 25 MG tablet Take 0.5 tablets (12.5 mg total) by mouth daily. 30 tablet 0   No current facility-administered medications for this visit.     Allergies:   Penicillins; Amlodipine; Lisinopril; Zocor [simvastatin]; and Tamiflu    Social History:  The patient  reports that he has never smoked. He has never used smokeless tobacco. He reports that he drinks about 1.2 oz of alcohol per week . He reports that he does not use drugs.   Family History:  The patient's family history includes Asthma in his sister; COPD in his  brother and father; Heart attack in his father; Heart disease in his brother; Hypertension in his father.    ROS:  Please see the history of present illness.   Otherwise, review of systems are positive for none.   All other systems are reviewed and negative.    PHYSICAL EXAM: VS:  BP 132/72   Pulse 60   Ht 6' (1.829 m)   Wt 224 lb (101.6 kg)   BMI 30.38 kg/m  , BMI Body mass index is 30.38 kg/m. GENERAL:  Well appearing HEENT:  Pupils equal round and reactive, fundi not visualized, oral mucosa unremarkable NECK:  No jugular venous  distention, waveform within normal limits, carotid upstroke brisk and symmetric, no bruits, no thyromegaly LYMPHATICS:  No cervical adenopathy LUNGS:  Clear to auscultation bilaterally HEART:  RRR. PMI not displaced or sustained,S1 and S2 within normal limits, no S3, no S4, no clicks, no rubs, no  murmurs ABD:  Flat, positive bowel sounds normal in frequency in pitch, no bruits, no rebound, no guarding, no midline pulsatile mass, no hepatomegaly, no splenomegaly EXT:  2 plus pulses throughout, no edema, no cyanosis no clubbing SKIN:  No rashes no nodules NEURO:  Cranial nerves II through XII grossly intact, motor grossly intact throughout PSYCH:  Cognitively intact, oriented to person place and time   EKG:  EKG is ordered today. 10/30/15: Sinus rhythm.  Rate 60 bpm.  RBBB.  LAD.  The ekg ordered 09/25/15 demonstrates atrial fibrillation.  Rate 84 bpm.   Lexiscan Myoview 10/17/15:  The left ventricular ejection fraction is moderately decreased (30-44%).  Nuclear stress EF: 39%.  There was no ST segment deviation noted during stress.  There is a small defect of moderate severity present in the apical lateral and apex location and medium sized defect of moderate severity in the basal inferior, mild inferior and apical inferior location. These defects are fixed and consistent with scar. No ischemia noted.  This is an intermediate risk study due to LV  dysfunction.   LHC 09/28/15:  Ostial to proximal LAD stenoses of this at least moderately calcified. 70% focal stenosis followed by diffuse 50% stenosis.  FFR evaluation of the combination of the LAD lesions showed no significant drop in baseline FFR from 0.96 down to 0.95. This suggests physiologically not significant lesion.  There is moderate left ventricular systolic dysfunction. The left ventricular ejection fraction is 35-45% by visual estimate.  LV end diastolic pressure is moderately elevated.  LHC 09/08/14:   1. 1st Mrg lesion, 30% stenosed. 2. Prox LAD lesion, 50% stenosed. 3. Ost LAD lesion, 70% stenosed. 4. Prox RCA to Dist RCA lesion, 35% stenosed.   50-70% ostial/proximal LAD stenosis within the heavily calcified segment. Recent myocardial perfusion energy and within the past week did not demonstrate anterior ischemia.  Heavily calcified but widely patent circumflex and right coronary with luminal irregularities noted in the proximal mid and distal segments.  Mild global left ventricular dysfunction with an ejection fraction in the 45-50% range   Recommendations:   The patient's symptoms are mixed. With exertion he experiences dyspnea. At rest and randomly he has neck jaw and left elbow discomfort. When both the angiogram and the myocardial perfusion study were considered together, no justification for percutaneous intervention on the LAD could be made.  Close clinical follow-up  Aggressive risk factor modification    Recent Labs: 07/11/2015: ALT 15 08/07/2015: Pro B Natriuretic peptide (BNP) 232.0; TSH 1.64 10/18/2015: BUN 15; Creat 1.06; Hemoglobin 14.6; Platelets 141; Potassium 4.6; Sodium 138    Lipid Panel    Component Value Date/Time   CHOL 144 07/11/2015 0956   TRIG 84 07/11/2015 0956   HDL 57 07/11/2015 0956   CHOLHDL 2.5 07/11/2015 0956   VLDL 17 07/11/2015 0956   LDLCALC 70 07/11/2015 0956      Wt Readings from Last 3 Encounters:    10/30/15 224 lb (101.6 kg)  10/17/15 224 lb (101.6 kg)  10/08/15 224 lb 9.6 oz (101.9 kg)      ASSESSMENT AND PLAN:  # Persistent atrial fibrillation: Mr. Rushin is back in sinus rhythm and feeling much better.   Because of  this and his reduced systolic function we will pursue a rhythm control strategy. Given his heart failure and CAD, amiodarone and dofetilide are his only options for antiarrhythmics. We discussed this as well as the possibility of catheter ablation. I explained to him that eventually the atrial fibrillation will recur and recommended that he see one of our electrophysiologists evaluate his options. He expressed understanding and is in agreement.  Continue Xarelto, diltiazem and metoprolol.  This patients CHA2DS2-VASc Score and unadjusted Ischemic Stroke Rate (% per year) is equal to 3.2 % stroke rate/year from a score of 3  Above score calculated as 1 point each if present [CHF, HTN, DM, Vascular=MI/PAD/Aortic Plaque, Age if 65-74, or Male] Above score calculated as 2 points each if present [Age > 75, or Stroke/TIA/TE]   # CAD: Mr. Pais has a known, 70% ostial LAD lesion.  His stress test did not reveal ischemia in this distribution and the FFR did not reveal a hemodynamically significant lesion.  Continue aggressive medical management.  He is not on aspirin or P2Y12 ihnibitor because he is on Xarelto. Continue atorvastatin, Zetia and metoprolol.  # Chronic systolic and diastolic heart failure: LVEF on LHC was 35-45% and 39% on Lexiscan Myoview.  He is euvolemic on exam.  We will add spironolactone 12.5mg  daily and stop potassium.  Switch to metoprolol succinate as above.  This is likely a mixed ischemic/non-ischemic cause.  # Hypertension: Blood pressure is well-controlled.  Continue metoprolol and diltiazem.  Switch metoprolol to 150 mg daily.  # Hyperlipidemia: Continue atorvastatin and Zetia.  LDL 70 06/2015.  We will check fasting lipids today.  If not at goal we will  start a PCSK-9 inhibitor.    Current medicines are reviewed at length with the patient today.  The patient does not have concerns regarding medicines.  The following changes have been made:  None  Labs/ tests ordered today include:   Orders Placed This Encounter  Procedures  . Lipid panel  . COMPLETE METABOLIC PANEL WITH GFR  . Ambulatory referral to Cardiac Electrophysiology  . EKG 12-Lead     Disposition:   FU with Tajana Crotteau C. [name], MD, [facility] in 3 months.    This note was written with the assistance of speech recognition software.  Please excuse any transcriptional errors.  Signed, Loni Delbridge C. [name], MD, [facility]  10/30/2015 8:54 AM    [place]

## 2015-10-30 NOTE — Patient Instructions (Addendum)
Medication Instructions:  STOP- Potassium and Metoprolol Tart START- Spironolactone 12.5 mg daily and Metoprolol Succinate 150 mg daily  Labwork: Today- Fasting Lipids 1 Week- CMP  Testing/Procedures: None Ordered  Follow-Up: Your physician wants you to follow-up in: 3 Months. You will receive a reminder letter in the mail two months in advance. If you don't receive a letter, please call our office to schedule the follow-up appointment.  You have been referred to Cardiac Electrophysiology   Any Other Special Instructions Will Be Listed Below (If Applicable).   If you need a refill on your cardiac medications before your next appointment, please call your pharmacy.

## 2015-10-31 ENCOUNTER — Encounter: Payer: Self-pay | Admitting: Cardiology

## 2015-10-31 ENCOUNTER — Ambulatory Visit (INDEPENDENT_AMBULATORY_CARE_PROVIDER_SITE_OTHER): Payer: Medicare Other | Admitting: Cardiology

## 2015-10-31 VITALS — BP 138/70 | HR 60 | Ht 72.0 in | Wt 224.0 lb

## 2015-10-31 DIAGNOSIS — I4819 Other persistent atrial fibrillation: Secondary | ICD-10-CM

## 2015-10-31 DIAGNOSIS — I481 Persistent atrial fibrillation: Secondary | ICD-10-CM

## 2015-10-31 NOTE — Progress Notes (Signed)
Electrophysiology Office Note   Date:  10/31/2015   ID:  Theodore [name], DOB Mar 25, 1934, MRN [id number]  PCP:  [name], MD  Cardiologist:  [name] Primary Electrophysiologist:  Anacaren Kohan Meredith Leeds, MD    Chief Complaint  Patient presents with  . Advice Only     History of Present Illness: Theodore [name] is a 81 y.o. male who presents today for electrophysiology evaluation.   He has a history of hypertension, hyperlipidemia, persistent atrial fibrillation, asthma, and rheumatic fever. Theodore [name] was diagnosed with atrial fibrillation in 2014.  He was initially on Pradaxa and switched to Xarelto.  He had an episode of chest pain 08/2014 and had a Lexiscan Myoview 08/31/14 that showed an old inferior scar with peri-infarct ischemia and LVEF 49%.  He then had a LHC that revealed 70% ostial LAD, 50% prox LAD, 30% OM1 and 35% RCA.  He was managed medically.  He subsequently had an echo 09/2014 that showed LVEF 55-60% with mild MR and AR and grade 1 diastolic dysfunction.  At that time atorvastatin was increased to 80 mg but he noted leg pain.  He was seen in the lipid clinic and started back on atorvastatin 40mg  and zetia 10 mg.    Theodore [name] was seen in clinic 09/25/15 and reported significant shortness of breath with minimal exertion.  One year ago he was able to walk a mile and play golf.  Now he has no energy and gets short of breath after walking 200 feet.  He denies exertional chest pain but does note mild chest pain that occurs at rest.  He had a LHC with Dr. Ellyn Hack on 09/28/15 that again revealed a 70% ostial-proximal LAD lesion.  Dr. Ellyn Hack performed FFR on that lesion and there was no significant change.  However, there was some concern regarding the accuracy of the result. The left ventriculogram performed at that time revealed LVEF 35-45%.  Theodore [name] continued to have exertional dyspnea so he was referred for [facility] 10/17/15 that was negative for ischemia. He subsequently  underwent DCCV on 10/23/15. Since his cardioversion, he has done well. He has not noted any further fatigue or shortness of breath. He is slowly working back into playing golf more often. He says that his energy is coming back.   Today, he denies symptoms of palpitations, chest pain, shortness of breath, orthopnea, PND, lower extremity edema, claudication, dizziness, presyncope, syncope, bleeding, or neurologic sequela. The patient is tolerating medications without difficulties and is otherwise without complaint today.    Past Medical History:  Diagnosis Date  . History of arthritis   . History of rheumatic fever   . Hypercholesterolemia   . Hypertension   . Nocturia   . PAF (paroxysmal atrial fibrillation) [facility]) January 2013   Placed on Pradaxa. Did not require cardioversion; spontaneously converted  . Peripheral edema   . Right bundle branch block   . SOB (shortness of breath)   . Tendonitis of elbow, left    Past Surgical History:  Procedure Laterality Date  . BACK SURGERY  12/24/09   fusion C3-C4  . BACK SURGERY  2010   fusion L4-L5  . CARDIAC CATHETERIZATION  2009   NONOBSTRUCTIVE ATHERSCLEROTIC CORONARY DISEASE AND NORMAL  LV FUNCTION  . CARDIAC CATHETERIZATION N/A 09/08/2014   Procedure: Left Heart Cath and Coronary Angiography;  Surgeon: Belva Crome, MD;  Location: [facility];  Service: Cardiovascular;  Laterality: N/A;  . CARDIAC CATHETERIZATION N/A 09/28/2015  Procedure: Left Heart Cath and Coronary Angiography;  Surgeon: Leonie Man, MD;  Location: [facility];  Service: Cardiovascular;  Laterality: N/A;  . CARDIAC CATHETERIZATION N/A 09/28/2015   Procedure: Intravascular Pressure Wire/FFR Study;  Surgeon: Leonie Man, MD;  Location: [place] [facility];  Service: Cardiovascular;  Laterality: N/A;  . CARDIOVERSION N/A 10/23/2015   Procedure: CARDIOVERSION;  Surgeon: [name], MD;  Location: [facility] ENDOSCOPY;  Service: Cardiovascular;  Laterality:  N/A;  . COLONOSCOPY  2013   per patient, rpt 5 yrs  . KNEE SURGERY Right 2007  . PATELLAR TENDON REPAIR Left 2008  . TRICEPS TENDON REPAIR Left 2013     Current Outpatient Prescriptions  Medication Sig Dispense Refill  . Ascorbic Acid (VITAMIN C PO) Take 1 tablet by mouth daily.     [name] atorvastatin (LIPITOR) 40 MG tablet Take 1 tablet (40 mg total) by mouth daily. 90 tablet 3  . Cholecalciferol (VITAMIN D3) 2000 units TABS Take 2,000 Units by mouth daily.    [name] diltiazem (CARTIA XT) 180 MG 24 hr capsule Take 1 capsule (180 mg total) by mouth daily. 90 capsule 1  . ezetimibe (ZETIA) 10 MG tablet Take 1 tablet (10 mg total) by mouth daily. 90 tablet 3  . furosemide (LASIX) 20 MG tablet Take 1 tablet (20 mg total) by mouth daily. 90 tablet 3  . losartan (COZAAR) 100 MG tablet Take 1 tablet (100 mg total) by mouth daily. 90 tablet 1  . LYSINE PO Take 1 tablet by mouth daily.     . metoprolol succinate (TOPROL-XL) 100 MG 24 hr tablet Take 1 tablet (100 mg total) by mouth daily. Take with or immediately following a meal. 30 tablet 0  . metoprolol succinate (TOPROL-XL) 50 MG 24 hr tablet Take 1 tablet (50 mg total) by mouth daily. Take with or immediately following a meal. 30 tablet 0  . naproxen sodium (ANAPROX) 220 MG tablet Take 220 mg by mouth daily.     [name] omeprazole (PRILOSEC) 20 MG capsule Take 20 mg by mouth daily.      . rivaroxaban (XARELTO) 20 MG TABS tablet Take 1 tablet (20 mg total) by mouth daily with supper. 90 tablet 1  . spironolactone (ALDACTONE) 25 MG tablet Take 0.5 tablets (12.5 mg total) by mouth daily. 30 tablet 0  . Tiotropium Bromide Monohydrate (SPIRIVA RESPIMAT) 1.25 MCG/ACT AERS Inhale 2 puffs into the lungs daily. 3 Inhaler 3   No current facility-administered medications for this visit.     Allergies:   Penicillins; Amlodipine; Lisinopril; Zocor [simvastatin]; and Tamiflu   Social History:  The patient  reports that he has never smoked. He has never used smokeless  tobacco. He reports that he drinks about 1.2 oz of alcohol per week . He reports that he does not use drugs.   Family History:  The patient's family history includes Asthma in his sister; COPD in his brother and father; Heart attack in his father; Heart disease in his brother; Hypertension in his father.    ROS:  Please see the history of present illness.   Otherwise, review of systems is positive for none.   All other systems are reviewed and negative.    PHYSICAL EXAM: VS:  BP 138/70   Pulse 60   Ht 6' (1.829 m)   Wt 224 lb (101.6 kg)   SpO2 93%   BMI 30.38 kg/m  , BMI Body mass index is 30.38 kg/m. GEN: Well nourished, well  developed, in no acute distress  HEENT: normal  Neck: no JVD, carotid bruits, or masses Cardiac: RRR; no murmurs, rubs, or gallops,no edema  Respiratory:  clear to auscultation bilaterally, normal work of breathing GI: soft, nontender, nondistended, + BS MS: no deformity or atrophy  Skin: warm and dry, Neuro:  Strength and sensation are intact Psych: euthymic mood, full affect  EKG:  EKG is not ordered today. Personal review of the ekg ordered shows sinus bradycardia, right bundle branch block, APC, PVCs  Recent Labs: 07/11/2015: ALT 15 08/07/2015: Pro B Natriuretic peptide (BNP) 232.0; TSH 1.64 10/18/2015: BUN 15; Creat 1.06; Hemoglobin 14.6; Platelets 141; Potassium 4.6; Sodium 138    Lipid Panel     Component Value Date/Time   CHOL 126 10/30/2015 0855   TRIG 67 10/30/2015 0855   HDL 54 10/30/2015 0855   CHOLHDL 2.3 10/30/2015 0855   VLDL 13 10/30/2015 0855   LDLCALC 59 10/30/2015 0855     Wt Readings from Last 3 Encounters:  10/31/15 224 lb (101.6 kg)  10/30/15 224 lb (101.6 kg)  10/17/15 224 lb (101.6 kg)      Other studies Reviewed: Additional studies/ records that were reviewed today include: SPECT 10/17/15, TTE 10/09/14  Review of the above records today demonstrates:   The left ventricular ejection fraction is moderately decreased  (30-44%).  Nuclear stress EF: 39%.  There was no ST segment deviation noted during stress.  There is a small defect of moderate severity present in the apical lateral and apex location and medium sized defect of moderate severity in the basal inferior, mild inferior and apical inferior location. These defects are fixed and consistent with scar. No ischemia noted.  This is an intermediate risk study due to LV dysfunction.   - Left ventricle: The cavity size was normal. Wall thickness was   normal. Systolic function was normal. The estimated ejection   fraction was in the range of 55% to 60%. Wall motion was normal;   there were no regional wall motion abnormalities. Doppler   parameters are consistent with abnormal left ventricular   relaxation (grade 1 diastolic dysfunction). - Aortic valve: There was mild regurgitation. - Mitral valve: There was mild regurgitation. - Left atrium: The atrium was mildly dilated. - Right ventricle: The cavity size was mildly dilated. - Right atrium: The atrium was mildly dilated. - Pulmonary arteries: Systolic pressure was mildly increased. PA   peak pressure: 36 mm Hg (S).  Impressions:  - Normal LV systolic function; grade 1 diastolic dysfunction; mild   biatrial enlargement; mild RVE; calcified aortic valve with mild   AI; mild MR; trace TR with mildly elevated pulmonary pressure.   ASSESSMENT AND PLAN:  1.  Persistent atrial fibrillation: Currently on Xarelto, diltiazem, and metoprolol for rate control. He had symptoms of fatigue and shortness of breath when he was in atrial fibrillation, and therefore a rhythm control strategy would twice a day all. I discussed the risks and benefits of both the dofetilide and dofetilide, as well as the risks and benefits of ablation. Risks of the procedure include bleeding, tamponade, heart block, stroke, and damage to surrounding organs. He does understand these risks. He is unsure of the treatment strategy  that he would prefer at this time, and we have therefore given him information on both ablation and medical management options.  This patients CHA2DS2-VASc Score and unadjusted Ischemic Stroke Rate (% per year) is equal to 3.2 % stroke rate/year from a score of 3  Above  score calculated as 1 point each if present [CHF, HTN, DM, Vascular=MI/PAD/Aortic Plaque, Age if 65-74, or Male] Above score calculated as 2 points each if present [Age > 75, or Stroke/TIA/TE]   2. CAD: Currently no chest pain  3. Chronic systolic heart failure: On Aldactone, metoprolol, and losartan  4. Hypertension: Well-controlled today   5. Hyperlipidemia: Continue atorvastatin ends that he    Current medicines are reviewed at length with the patient today.   The patient does not have concerns regarding his medicines.  The following changes were made today:  none  Labs/ tests ordered today include:  No orders of the defined types were placed in this encounter.    Disposition:   FU with Brownie Nehme 6 weeks  Signed, Lolah Coghlan Meredith Leeds, MD  10/31/2015 11:03 AM     [address] [place] [address] [phone] (office) [phone] (fax)

## 2015-10-31 NOTE — Patient Instructions (Signed)
Medication Instructions:    Your physician recommends that you continue on your current medications as directed. Please refer to the Current Medication list given to you today.  --- If you need a refill on your cardiac medications before your next appointment, please call your pharmacy. ---  Labwork:  None ordered  Testing/Procedures:  None ordered  Follow-Up:  Your physician recommends that you schedule a follow-up appointment in: 6 weeks with Dr. [name]  Thank you for choosing [facility]!!   Trinidad Curet, RN [phone]  Any Other Special Instructions Will Be Listed Below (If Applicable). Amiodarone tablets What is this medicine? AMIODARONE (a MEE oh da rone) is an antiarrhythmic drug. It helps make your heart beat regularly. Because of the side effects caused by this medicine, it is only used when other medicines have not worked. It is usually used for heartbeat problems that may be life threatening. This medicine may be used for other purposes; ask your health care provider or pharmacist if you have questions. What should I tell my health care provider before I take this medicine? They need to know if you have any of these conditions: -liver disease -lung disease -other heart problems -thyroid disease -an unusual or allergic reaction to amiodarone, iodine, other medicines, foods, dyes, or preservatives -pregnant or trying to get pregnant -breast-feeding How should I use this medicine? Take this medicine by mouth with a glass of water. Follow the directions on the prescription label. You can take this medicine with or without food. However, you should always take it the same way each time. Take your doses at regular intervals. Do not take your medicine more often than directed. Do not stop taking except on the advice of your doctor or health care professional. A special MedGuide will be given to you by the pharmacist with each prescription and refill. Be sure to  read this information carefully each time. Talk to your pediatrician regarding the use of this medicine in children. Special care may be needed. Overdosage: If you think you have taken too much of this medicine contact a poison control center or emergency room at once. NOTE: This medicine is only for you. Do not share this medicine with others. What if I miss a dose? If you miss a dose, take it as soon as you can. If it is almost time for your next dose, take only that dose. Do not take double or extra doses. What may interact with this medicine? Do not take this medicine with any of the following medications: -abarelix -apomorphine -arsenic trioxide -certain antibiotics like erythromycin, gemifloxacin, levofloxacin, pentamidine -certain medicines for depression like amoxapine, tricyclic antidepressants -certain medicines for fungal infections like fluconazole, itraconazole, ketoconazole, posaconazole, voriconazole -certain medicines for irregular heart beat like disopyramide, dofetilide, dronedarone, ibutilide, propafenone, sotalol -certain medicines for malaria like chloroquine, halofantrine -cisapride -droperidol -haloperidol -hawthorn -maprotiline -methadone -phenothiazines like chlorpromazine, mesoridazine, thioridazine -pimozide -ranolazine -red yeast rice -vardenafil -ziprasidone This medicine may also interact with the following medications: -antiviral medicines for HIV or AIDS -certain medicines for blood pressure, heart disease, irregular heart beat -certain medicines for cholesterol like atorvastatin, cerivastatin, lovastatin, simvastatin -certain medicines for hepatitis C like sofosbuvir and ledipasvir; sofosbuvir -certain medicines for seizures like phenytoin -certain medicines for thyroid problems -certain medicines that treat or prevent blood clots like  warfarin -cholestyramine -cimetidine -clopidogrel -cyclosporine -dextromethorphan -diuretics -fentanyl -general anesthetics -grapefruit juice -lidocaine -loratadine -methotrexate -other medicines that prolong the QT interval (cause an abnormal heart rhythm) -procainamide -quinidine -rifabutin, rifampin, or  rifapentine -St. John's Wort -trazodone This list may not describe all possible interactions. Give your health care provider a list of all the medicines, herbs, non-prescription drugs, or dietary supplements you use. Also tell them if you smoke, drink alcohol, or use illegal drugs. Some items may interact with your medicine. What should I watch for while using this medicine? Your condition will be monitored closely when you first begin therapy. Often, this drug is first started in a hospital or other monitored health care setting. Once you are on maintenance therapy, visit your doctor or health care professional for regular checks on your progress. Because your condition and use of this medicine carry some risk, it is a good idea to carry an identification card, necklace or bracelet with details of your condition, medications, and doctor or health care professional. Dennis Bast may get drowsy or dizzy. Do not drive, use machinery, or do anything that needs mental alertness until you know how this medicine affects you. Do not stand or sit up quickly, especially if you are an older patient. This reduces the risk of dizzy or fainting spells. This medicine can make you more sensitive to the sun. Keep out of the sun. If you cannot avoid being in the sun, wear protective clothing and use sunscreen. Do not use sun lamps or tanning beds/booths. You should have regular eye exams before and during treatment. Call your doctor if you have blurred vision, see halos, or your eyes become sensitive to light. Your eyes may get dry. It may be helpful to use a lubricating eye solution or artificial tears  solution. If you are going to have surgery or a procedure that requires contrast dyes, tell your doctor or health care professional that you are taking this medicine. What side effects may I notice from receiving this medicine? Side effects that you should report to your doctor or health care professional as soon as possible: -allergic reactions like skin rash, itching or hives, swelling of the face, lips, or tongue -blue-gray coloring of the skin -blurred vision, seeing blue green halos, increased sensitivity of the eyes to light -breathing problems -chest pain -dark urine -fast, irregular heartbeat -feeling faint or light-headed -intolerance to heat or cold -nausea or vomiting -pain and swelling of the scrotum -pain, tingling, numbness in feet, hands -redness, blistering, peeling or loosening of the skin, including inside the mouth -spitting up blood -stomach pain -sweating -unusual or uncontrolled movements of body -unusually weak or tired -weight gain or loss -yellowing of the eyes or skin Side effects that usually do not require medical attention (report to your doctor or health care professional if they continue or are bothersome): -change in sex drive or performance -constipation -dizziness -headache -loss of appetite -trouble sleeping This list may not describe all possible side effects. Call your doctor for medical advice about side effects. You may report side effects to FDA at 1-800-FDA-1088. Where should I keep my medicine? Keep out of the reach of children. Store at room temperature between 20 and 25 degrees C (68 and 77 degrees F). Protect from light. Keep container tightly closed. Throw away any unused medicine after the expiration date. NOTE: This sheet is a summary. It may not cover all possible information. If you have questions about this medicine, talk to your doctor, pharmacist, or health care provider.    2016, Elsevier/Gold Standard. (2013-05-02  19:48:11)   Dofetilide capsules What is this medicine? DOFETILIDE (doe FET il ide) is an antiarrhythmic drug. It helps make your heart  beat regularly. This medicine also helps to slow rapid heartbeats. This medicine may be used for other purposes; ask your health care provider or pharmacist if you have questions. What should I tell my health care provider before I take this medicine? They need to know if you have any of these conditions: -heart disease -history of low levels of potassium or magnesium -kidney disease -liver disease -an unusual or allergic reaction to dofetilide, other medicines, foods, dyes, or preservatives -pregnant or trying to get pregnant -breast-feeding How should I use this medicine? Take this medicine by mouth with a glass of water. Follow the directions on the prescription label. You can take this medicine with or without food. Do not drink grapefruit juice with this medicine. Take your doses at regular intervals. Do not take your medicine more often than directed. Do not stop taking this medicine suddenly. This may cause serious, heart-related side effects. Your doctor will tell you how much medicine to take. If your doctor wants you to stop the medicine, the dose will be slowly lowered over time to avoid any side effects. Talk to your pediatrician regarding the use of this medicine in children. Special care may be needed. Overdosage: If you think you have taken too much of this medicine contact a poison control center or emergency room at once. NOTE: This medicine is only for you. Do not share this medicine with others. What if I miss a dose? If you miss a dose, take it as soon as you can. If it is almost time for your next dose, take only that dose. Do not take double or extra doses. What may interact with this medicine? Do not take this medicine with any of the following medications: -cimetidine -dolutegravir -hydrochlorothiazide alone or in combination with  other medicines -ketoconazole -megestrol -other medicines that prolong the QT interval (cause an abnormal heart rhythm) -prochlorperazine -trimethoprim alone or in combination with sulfamethoxazole -verapamil This medicine may also interact with the following medications: -amiloride -certain antidepressants like fluvoxamine or paroxetine -certain antiviral medicines for HIV or AIDS like atazanavir or darunavir -certain medicines for fungal infections like clotrimazole or miconazole -digoxin -diltiazem -dronabinol, THC -grapefruit juice -metformin -nefazodone -triamterene -zafirlukast This list may not describe all possible interactions. Give your health care provider a list of all the medicines, herbs, non-prescription drugs, or dietary supplements you use. Also tell them if you smoke, drink alcohol, or use illegal drugs. Some items may interact with your medicine. What should I watch for while using this medicine? Visit your doctor or health care professional for regular checks on your progress. Wear a medical ID bracelet or chain, and carry a card that describes your disease and details of your medicine and dosage times. Check your heart rate and blood pressure regularly while you are taking this medicine. Ask your doctor or health care professional what your heart rate and blood pressure should be, and when you should contact him or her. Your doctor or health care professional also may schedule regular tests to check your progress. You will be started on this medicine in a specialized facility for at least three days. You will be monitored to find the right dose of medicine for you. It is very important that you take your medicine exactly as prescribed when you leave the hospital. The correct dosing of this medicine is very important to treat your condition and prevent possible serious side effects. What side effects may I notice from receiving this medicine? Side effects that you should  report to your doctor or health care professional as soon as possible: -allergic reactions like skin rash, itching or hives, swelling of the face, lips, or tongue -breathing problems -dizziness -fast or rapid beating of the heart -feeling faint or lightheaded -swelling of the ankles -unusually weak or tired -vomiting Side effects that usually do not require medical attention (report to your doctor or health care professional if they continue or are bothersome): -cough -diarrhea -difficulty sleeping -headache -nausea -stomach pain This list may not describe all possible side effects. Call your doctor for medical advice about side effects. You may report side effects to FDA at 1-800-FDA-1088. Where should I keep my medicine? Keep out of the reach of children. Store at room temperature between 15 and 30 degrees C (59 and 86 degrees F). Protect the medicine from moisture or humidity. Keep container tightly closed. Throw away any unused medicine after the expiration date. NOTE: This sheet is a summary. It may not cover all possible information. If you have questions about this medicine, talk to your doctor, pharmacist, or health care provider.    2016, Elsevier/Gold Standard. (2013-09-19 16:21:18)   Cardiac Ablation Cardiac ablation is a procedure to disable a small amount of heart tissue in very specific places. The heart has many electrical connections. Sometimes these connections are abnormal and can cause the heart to beat very fast or irregularly. By disabling some of the problem areas, heart rhythm can be improved or made normal. Ablation is done for people who:   Have Wolff-Parkinson-White syndrome.   Have other fast heart rhythms (tachycardia).   Have taken medicines for an abnormal heart rhythm (arrhythmia) that resulted in:   No success.   Side effects.   May have a high-risk heartbeat that could result in death.  LET [facility] CARE PROVIDER KNOW ABOUT:   Any  allergies you have or any previous reactions you have had to X-ray dye, food (such as seafood), medicine, or tape.   All medicines you are taking, including vitamins, herbs, eye drops, creams, and over-the-counter medicines.   Previous problems you or members of your family have had with the use of anesthetics.   Any blood disorders you have.   Previous surgeries or procedures (such as a kidney transplant) you have had.   Medical conditions you have (such as kidney failure).  RISKS AND COMPLICATIONS Generally, cardiac ablation is a safe procedure. However, problems can occur and include:   Increased risk of cancer. Depending on how long it takes to do the ablation, the dose of radiation can be high.  Bruising and bleeding where a thin, flexible tube (catheter) was inserted during the procedure.   Bleeding into the chest, especially into the sac that surrounds the heart (serious).  Need for a permanent pacemaker if the normal electrical system is damaged.   The procedure may not be fully effective, and this may not be recognized for months. Repeat ablation procedures are sometimes required. BEFORE THE PROCEDURE   Follow any instructions from your health care provider regarding eating and drinking before the procedure.   Take your medicines as directed at regular times with water, unless instructed otherwise by your health care provider. If you are taking diabetes medicine, including insulin, ask how you are to take it and if there are any special instructions you should follow. It is common to adjust insulin dosing the day of the ablation.  PROCEDURE  An ablation is usually performed in a catheterization laboratory with the guidance of fluoroscopy.  Fluoroscopy is a type of X-ray that helps your health care provider see images of your heart during the procedure.   An ablation is a minimally invasive procedure. This means a small cut (incision) is made in either your neck or  groin. Your health care provider will decide where to make the incision based on your medical history and physical exam.  An IV tube will be started before the procedure begins. You will be given an anesthetic or medicine to help you relax (sedative).  The skin on your neck or groin will be numbed. A needle will be inserted into a large vein in your neck or groin and catheters will be threaded to your heart.  A special dye that shows up on fluoroscopy pictures may be injected through the catheter. The dye helps your health care provider see the area of the heart that needs treatment.  The catheter has electrodes on the tip. When the area of heart tissue that is causing the arrhythmia is found, the catheter tip will send an electrical current to the area and "scar" the tissue. Three types of energy can be used to ablate the heart tissue:   Heat (radiofrequency energy).   Laser energy.   Extreme cold (cryoablation).   When the area of the heart has been ablated, the catheter will be taken out. Pressure will be held on the insertion site. This will help the insertion site clot and keep it from bleeding. A bandage will be placed on the insertion site.  AFTER THE PROCEDURE   After the procedure, you will be taken to a recovery area where your vital signs (blood pressure, heart rate, and breathing) will be monitored. The insertion site will also be monitored for bleeding.   You will need to lie still for 4-6 hours. This is to ensure you do not bleed from the catheter insertion site.    This information is not intended to replace advice given to you by your health care provider. Make sure you discuss any questions you have with your health care provider.   Document Released: 06/15/2008 Document Revised: 02/17/2014 Document Reviewed: 06/21/2012 Elsevier Interactive Patient Education [employer].

## 2015-11-06 ENCOUNTER — Telehealth: Payer: Self-pay | Admitting: Cardiology

## 2015-11-06 DIAGNOSIS — I4819 Other persistent atrial fibrillation: Secondary | ICD-10-CM

## 2015-11-06 NOTE — Telephone Encounter (Signed)
Patient would like to scheduled AFib ablation for 12/7. He is aware I will arrange and call him within next week or so to review instructions. Patient verbalized understanding and agreeable to plan.

## 2015-11-06 NOTE — Telephone Encounter (Signed)
New message    Pt wants to find out about the schedule for a procedure. Please call.

## 2015-11-08 LAB — COMPLETE METABOLIC PANEL WITH GFR
ALBUMIN: 4.3 g/dL (ref 3.6–5.1)
ALT: 25 U/L (ref 9–46)
AST: 26 U/L (ref 10–35)
Alkaline Phosphatase: 60 U/L (ref 40–115)
BILIRUBIN TOTAL: 0.8 mg/dL (ref 0.2–1.2)
BUN: 17 mg/dL (ref 7–25)
CO2: 27 mmol/L (ref 20–31)
CREATININE: 1.17 mg/dL (ref 0.70–1.18)
Calcium: 9.8 mg/dL (ref 8.6–10.3)
Chloride: 97 mmol/L — ABNORMAL LOW (ref 98–110)
GFR, EST AFRICAN AMERICAN: 68 mL/min (ref >=60)
GFR, Est Non African American: 59 mL/min — ABNORMAL LOW (ref >=60)
Glucose, Bld: 90 mg/dL (ref 65–99)
Potassium: 5.5 mmol/L — ABNORMAL HIGH (ref 3.5–5.3)
Sodium: 133 mmol/L — ABNORMAL LOW (ref 135–146)
TOTAL PROTEIN: 6.7 g/dL (ref 6.1–8.1)

## 2015-11-09 ENCOUNTER — Telehealth: Payer: Self-pay | Admitting: *Deleted

## 2015-11-09 NOTE — Telephone Encounter (Signed)
Advised patient of lab results  

## 2015-11-09 NOTE — Telephone Encounter (Signed)
-----   Message from Skeet Latch, MD sent at 11/09/2015  5:03 PM EDT ----- Potassium is a little high and sodium is low.  Stop spironolactone.

## 2015-11-14 ENCOUNTER — Other Ambulatory Visit: Payer: Self-pay | Admitting: Cardiovascular Disease

## 2015-11-14 NOTE — Telephone Encounter (Signed)
Review for refill. 

## 2015-11-16 NOTE — Telephone Encounter (Signed)
Patient cannot talk at the moment.  Informed that I would call him back next week to review.  He is agreeable

## 2015-11-20 ENCOUNTER — Other Ambulatory Visit: Payer: Self-pay

## 2015-11-23 ENCOUNTER — Telehealth: Payer: Self-pay | Admitting: Cardiovascular Disease

## 2015-11-23 ENCOUNTER — Encounter: Payer: Self-pay | Admitting: Cardiology

## 2015-11-23 MED ORDER — FUROSEMIDE 20 MG PO TABS: 20.0000 mg | ORAL_TABLET | Freq: Every day | ORAL | 3 refills | Status: DC

## 2015-11-23 NOTE — Telephone Encounter (Signed)
SPOKE WITH PATIENT  NEED REFILL OF MEDICATION, STATES [facility] HAS BEEN SENDING A REQUEST.  RN E-SENT REFILL TO MAIL ORDER. PATIENT AWARE

## 2015-11-23 NOTE — Telephone Encounter (Signed)
Offered patient sooner ablation date of 11/8. Patient is agreeable. Will get cardiac CT pre-cert and office will call him to schedule appt. He understands I will call him back to review instructions and schedule needed appts.

## 2015-11-23 NOTE — Telephone Encounter (Signed)
New Message  Pt c/o medication issue:  1. Name of Medication: Furosemide   2. How are you currently taking this medication (dosage and times per day)? 20mg   3. Are you having a reaction (difficulty breathing--STAT)? no  4. What is your medication issue? Pt call requesting to speak with RN. Pt states he needs a approval for [facility] to fill medication. Please call back to discuss

## 2015-11-23 NOTE — Addendum Note (Signed)
Addended by: Stanton Kidney on: 11/23/2015 09:45 AM   Modules accepted: Orders

## 2015-11-23 NOTE — Telephone Encounter (Signed)
Reviewed instructions with patient.  Letter of instructions sent through [place]. He will come to office 10/25 for H&P and labs. Cardiac CT scheduled for 11/2. Will arrange post ablation follow up at appt on 10/25. Patient verbalized understanding and agreeable to plan.

## 2015-11-26 ENCOUNTER — Other Ambulatory Visit: Payer: Self-pay | Admitting: Cardiovascular Disease

## 2015-11-26 NOTE — Telephone Encounter (Signed)
Please review for refill. Thanks!  

## 2015-12-04 NOTE — Progress Notes (Signed)
Electrophysiology Office Note   Date:  12/05/2015   ID:  Theodore [name], DOB 01/31/1935, MRN [id number]  PCP:  [name], MD  Cardiologist:  [name] Primary Electrophysiologist:  Darneshia Demary Meredith Leeds, MD    Chief Complaint  [name] presents with  . Follow-up    Persistent Afib/ H&P Labs  . Shortness of Breath     History of Present Illness: Theodore [name] is a 82 y.o. male who presents today for electrophysiology evaluation.   He has a history of hypertension, hyperlipidemia, persistent atrial fibrillation, asthma, and rheumatic fever. Theodore [name] was diagnosed with atrial fibrillation in 2014.  He was initially on Pradaxa and switched to Xarelto.  He had an episode of chest pain 08/2014 and had a Lexiscan Myoview 08/31/14 that showed an old inferior scar with peri-infarct ischemia and LVEF 49%.  He then had a LHC that revealed 70% ostial LAD, 50% prox LAD, 30% OM1 and 35% RCA.    AF ablation scheduled 12/19/15. He is feeling well today without any complaint. He does continue to have occasional episodes of fatigue. Otherwise, he feels well without major complaint.  Today, he denies symptoms of palpitations, chest pain, shortness of breath, orthopnea, PND, lower extremity edema, claudication, dizziness, presyncope, syncope, bleeding, or neurologic sequela. Theodore [name] is tolerating medications without difficulties and is otherwise without complaint today.    Past Medical History:  Diagnosis Date  . History of arthritis   . History of rheumatic fever   . Hypercholesterolemia   . Hypertension   . Nocturia   . PAF (paroxysmal atrial fibrillation) [facility]) January 2013   Placed on Pradaxa. Did not require cardioversion; spontaneously converted  . Peripheral edema   . Right bundle branch block   . SOB (shortness of breath)   . Tendonitis of elbow, left    Past Surgical History:  Procedure Laterality Date  . BACK SURGERY  12/24/09   fusion C3-C4  . BACK SURGERY  2010   fusion  L4-L5  . CARDIAC CATHETERIZATION  2009   NONOBSTRUCTIVE ATHERSCLEROTIC CORONARY DISEASE AND NORMAL  LV FUNCTION  . CARDIAC CATHETERIZATION N/A 09/08/2014   Procedure: Left Heart Cath and Coronary Angiography;  Surgeon: Belva Crome, MD;  Location: [place] [facility];  Service: Cardiovascular;  Laterality: N/A;  . CARDIAC CATHETERIZATION N/A 09/28/2015   Procedure: Left Heart Cath and Coronary Angiography;  Surgeon: Leonie Man, MD;  Location: [place] [facility];  Service: Cardiovascular;  Laterality: N/A;  . CARDIAC CATHETERIZATION N/A 09/28/2015   Procedure: Intravascular Pressure Wire/FFR Study;  Surgeon: Leonie Man, MD;  Location: [place] [facility];  Service: Cardiovascular;  Laterality: N/A;  . CARDIOVERSION N/A 10/23/2015   Procedure: CARDIOVERSION;  Surgeon: [name], MD;  Location[facility];  Service: Cardiovascular;  Laterality: N/A;  . COLONOSCOPY  2013   per [name], rpt 5 yrs  . KNEE SURGERY Right 2007  . PATELLAR TENDON REPAIR Left 2008  . TRICEPS TENDON REPAIR Left 2013     Current Outpatient Prescriptions  Medication Sig Dispense Refill  . Ascorbic Acid (VITAMIN C PO) Take 1 tablet by mouth daily.     [name] atorvastatin (LIPITOR) 40 MG tablet Take 1 tablet (40 mg total) by mouth daily. 90 tablet 3  . Cholecalciferol (VITAMIN D3) 2000 units TABS Take 2,000 Units by mouth daily.    [name] diltiazem (CARTIA XT) 180 MG 24 hr capsule Take 1 capsule (180 mg total) by mouth daily. 90 capsule 3  .  ezetimibe (ZETIA) 10 MG tablet Take 1 tablet (10 mg total) by mouth daily. 90 tablet 3  . furosemide (LASIX) 20 MG tablet Take 1 tablet (20 mg total) by mouth daily. 90 tablet 3  . losartan (COZAAR) 100 MG tablet Take 1 tablet (100 mg total) by mouth daily. 90 tablet 3  . LYSINE PO Take 1 tablet by mouth daily.     . metoprolol succinate (TOPROL-XL) 100 MG 24 hr tablet TAKE 1 TABLET (100 MG TOTAL) BY MOUTH DAILY. TAKE WITH OR IMMEDIATELY FOLLOWING A MEAL. 30 tablet 0  .  metoprolol succinate (TOPROL-XL) 50 MG 24 hr tablet TAKE 1 TABLET (50 MG TOTAL) BY MOUTH DAILY. TAKE WITH OR IMMEDIATELY FOLLOWING A MEAL. 30 tablet 0  . naproxen sodium (ANAPROX) 220 MG tablet Take 220 mg by mouth daily.     [name] omeprazole (PRILOSEC) 20 MG capsule Take 20 mg by mouth daily.      . potassium chloride (K-DUR,KLOR-CON) 10 MEQ tablet Take 10 mEq by mouth 2 (two) times daily.    . rivaroxaban (XARELTO) 20 MG TABS tablet Take 1 tablet (20 mg total) by mouth daily with supper. 90 tablet 1  . Tiotropium Bromide Monohydrate (SPIRIVA RESPIMAT) 1.25 MCG/ACT AERS Inhale 2 puffs into Theodore lungs daily. 3 Inhaler 3   No current facility-administered medications for this visit.     Allergies:   Penicillins; Amlodipine; Lisinopril; Zocor [simvastatin]; and Tamiflu   Social History:  Theodore [name]  reports that he has never smoked. He has never used smokeless tobacco. He reports that he drinks about 1.2 oz of alcohol per week . He reports that he does not use drugs.   Family History:  Theodore [name]'s family history includes Asthma in his sister; COPD in his brother and father; Heart attack in his father; Heart disease in his brother; Hypertension in his father.    ROS:  Please see Theodore history of present illness.   Otherwise, review of systems is positive for none.   All other systems are reviewed and negative.    PHYSICAL EXAM: VS:  BP (!) 168/84   Pulse 70   Ht 6' (1.829 m)   Wt 222 lb 3.2 oz (100.8 kg)   BMI 30.14 kg/m  , BMI Body mass index is 30.14 kg/m. GEN: Well nourished, well developed, in no acute distress  HEENT: normal  Neck: no JVD, carotid bruits, or masses Cardiac: RRR; no murmurs, rubs, or gallops,no edema  Respiratory:  clear to auscultation bilaterally, normal work of breathing GI: soft, nontender, nondistended, + BS MS: no deformity or atrophy  Skin: warm and dry, Neuro:  Strength and sensation are intact Psych: euthymic mood, full affect  EKG:  EKG is not ordered  today. Personal review of Theodore ekg ordered 9/19 shows sinus bradycardia, right bundle branch block, APC, PVCs  Recent Labs: 08/07/2015: Pro B Natriuretic peptide (BNP) 232.0; TSH 1.64 10/18/2015: Hemoglobin 14.6; Platelets 141 11/07/2015: ALT 25; BUN 17; Creat 1.17; Potassium 5.5; Sodium 133    Lipid Panel     Component Value Date/Time   CHOL 126 10/30/2015 0855   TRIG 67 10/30/2015 0855   HDL 54 10/30/2015 0855   CHOLHDL 2.3 10/30/2015 0855   VLDL 13 10/30/2015 0855   LDLCALC 59 10/30/2015 0855     Wt Readings from Last 3 Encounters:  12/05/15 222 lb 3.2 oz (100.8 kg)  10/31/15 224 lb (101.6 kg)  10/30/15 224 lb (101.6 kg)      Other studies Reviewed:  Additional studies/ records that were reviewed today include: SPECT 10/17/15, TTE 10/09/14  Review of Theodore above records today demonstrates:   Theodore left ventricular ejection fraction is moderately decreased (30-44%).  Nuclear stress EF: 39%.  There was no ST segment deviation noted during stress.  There is a small defect of moderate severity present in Theodore apical lateral and apex location and medium sized defect of moderate severity in Theodore basal inferior, mild inferior and apical inferior location. These defects are fixed and consistent with scar. No ischemia noted.  This is an intermediate risk study due to LV dysfunction.   - Left ventricle: Theodore cavity size was normal. Wall thickness was   normal. Systolic function was normal. Theodore estimated ejection   fraction was in Theodore range of 55% to 60%. Wall motion was normal;   there were no regional wall motion abnormalities. Doppler   parameters are consistent with abnormal left ventricular   relaxation (grade 1 diastolic dysfunction). - Aortic valve: There was mild regurgitation. - Mitral valve: There was mild regurgitation. - Left atrium: Theodore atrium was mildly dilated. - Right ventricle: Theodore cavity size was mildly dilated. - Right atrium: Theodore atrium was mildly dilated. - Pulmonary  arteries: Systolic pressure was mildly increased. PA   peak pressure: 36 mm Hg (S).  Impressions:  - Normal LV systolic function; grade 1 diastolic dysfunction; mild   biatrial enlargement; mild RVE; calcified aortic valve with mild   AI; mild MR; trace TR with mildly elevated pulmonary pressure.   ASSESSMENT AND PLAN:  1.  Persistent atrial fibrillation: Currently on Xarelto, diltiazem, and metoprolol for rate control. He had symptoms of fatigue and shortness of breath when he was in atrial fibrillation, and therefore a rhythm control strategy. AF ablation scheduled 12/19/15. Risks of Theodore procedure include bleeding, tamponade, heart block, stroke, and damage to surrounding organs. He understands these risks and has agreed to Theodore procedure. We have told him to hold his medications prior to his procedure. He is to take his Xarelto Theodore night before.   This patients CHA2DS2-VASc Score and unadjusted Ischemic Stroke Rate (% per year) is equal to 3.2 % stroke rate/year from a score of 3  Above score calculated as 1 point each if present [CHF, HTN, DM, Vascular=MI/PAD/Aortic Plaque, Age if 65-74, or Male] Above score calculated as 2 points each if present [Age > 75, or Stroke/TIA/TE]   2. CAD: Currently no chest pain  3. Chronic systolic heart failure: On Aldactone, metoprolol, and losartan  4. Hypertension: Well-controlled today   5. Hyperlipidemia: Continue atorvastatin    Current medicines are reviewed at length with Theodore [name] today.   Theodore [name] does not have concerns regarding his medicines.  Theodore following changes were made today:  none  Labs/ tests ordered today include:  No orders of Theodore defined types were placed in this encounter.    Disposition:   FU with Theodore [name] 6 weeks  Signed, Johnney Scarlata Meredith Leeds, MD  12/05/2015 9:20 AM     [facility] [address] [place] [address] [phone] (office) [phone] (fax)

## 2015-12-05 ENCOUNTER — Ambulatory Visit (INDEPENDENT_AMBULATORY_CARE_PROVIDER_SITE_OTHER): Payer: Medicare Other | Admitting: Cardiology

## 2015-12-05 ENCOUNTER — Encounter: Payer: Self-pay | Admitting: Cardiology

## 2015-12-05 VITALS — BP 168/84 | HR 70 | Ht 72.0 in | Wt 222.2 lb

## 2015-12-05 DIAGNOSIS — I481 Persistent atrial fibrillation: Secondary | ICD-10-CM | POA: Diagnosis not present

## 2015-12-05 DIAGNOSIS — I4819 Other persistent atrial fibrillation: Secondary | ICD-10-CM

## 2015-12-05 DIAGNOSIS — Z01812 Encounter for preprocedural laboratory examination: Secondary | ICD-10-CM | POA: Diagnosis not present

## 2015-12-05 LAB — CBC WITH DIFFERENTIAL/PLATELET
BASOS ABS: 0 {cells}/uL (ref 0–200)
Basophils Relative: 0 %
EOS PCT: 1 %
Eosinophils Absolute: 68 cells/uL (ref 15–500)
HCT: 44.4 % (ref 38.5–50.0)
Hemoglobin: 16 g/dL (ref 13.2–17.1)
Lymphocytes Relative: 34 %
Lymphs Abs: 2312 cells/uL (ref 850–3900)
MCH: 33 pg (ref 27.0–33.0)
MCHC: 36 g/dL (ref 32.0–36.0)
MCV: 91.5 fL (ref 80.0–100.0)
MONOS PCT: 11 %
MPV: 9.3 fL (ref 7.5–12.5)
Monocytes Absolute: 748 cells/uL (ref 200–950)
NEUTROS ABS: 3672 {cells}/uL (ref 1500–7800)
Neutrophils Relative %: 54 %
PLATELETS: 143 10*3/uL (ref 140–400)
RBC: 4.85 MIL/uL (ref 4.20–5.80)
RDW: 15.6 % — AB (ref 11.0–15.0)
WBC: 6.8 10*3/uL (ref 3.8–10.8)

## 2015-12-05 LAB — BASIC METABOLIC PANEL
BUN: 15 mg/dL (ref 7–25)
CALCIUM: 9.3 mg/dL (ref 8.6–10.3)
CO2: 26 mmol/L (ref 20–31)
CREATININE: 1.01 mg/dL (ref 0.70–1.18)
Chloride: 99 mmol/L (ref 98–110)
Glucose, Bld: 113 mg/dL — ABNORMAL HIGH (ref 65–99)
Potassium: 4.3 mmol/L (ref 3.5–5.3)
Sodium: 134 mmol/L — ABNORMAL LOW (ref 135–146)

## 2015-12-05 NOTE — Patient Instructions (Signed)
Medication Instructions:    Your physician recommends that you continue on your current medications as directed. Please refer to the Current Medication list given to you today.  --- If you need a refill on your cardiac medications before your next appointment, please call your pharmacy. ---  Labwork:  Pre procedure labs today: BMET, CBC w/ diff  Testing/Procedures:  None ordered  Follow-Up:  Your physician recommends that you schedule a follow-up appointment in: 4 weeks, after procedure on 12/19/15, with Roderic Palau, NP in the AFib clinic.   Your physician recommends that you schedule a follow-up appointment in: 3 months, after procedure on 12/19/15, with Dr [name].   Thank you for choosing [facility]!!   Trinidad Curet, RN [phone]

## 2015-12-13 ENCOUNTER — Ambulatory Visit (HOSPITAL_COMMUNITY)
Admission: RE | Admit: 2015-12-13 | Discharge: 2015-12-13 | Disposition: A | Discharge status: Home / Self Care | Payer: Medicare Other | Source: Ambulatory Visit | Attending: Cardiology | Admitting: Cardiology

## 2015-12-13 ENCOUNTER — Encounter (HOSPITAL_COMMUNITY): Payer: Self-pay

## 2015-12-13 DIAGNOSIS — I481 Persistent atrial fibrillation: Secondary | ICD-10-CM | POA: Insufficient documentation

## 2015-12-13 DIAGNOSIS — I4891 Unspecified atrial fibrillation: Secondary | ICD-10-CM | POA: Diagnosis not present

## 2015-12-13 DIAGNOSIS — I4819 Other persistent atrial fibrillation: Secondary | ICD-10-CM

## 2015-12-13 MED ORDER — NITROGLYCERIN 0.4 MG SL SUBL
0.4000 mg | SUBLINGUAL_TABLET | Freq: Once | SUBLINGUAL | Status: AC
  Administered 2015-12-13: 0.4 mg via SUBLINGUAL

## 2015-12-13 MED ORDER — NITROGLYCERIN 0.4 MG SL SUBL
SUBLINGUAL_TABLET | SUBLINGUAL | Status: AC
  Filled 2015-12-13: qty 1

## 2015-12-13 MED ORDER — IOPAMIDOL (ISOVUE-370) INJECTION 76%
INTRAVENOUS | Status: AC
  Administered 2015-12-13: 80 mL
  Filled 2015-12-13: qty 100

## 2015-12-18 NOTE — Anesthesia Preprocedure Evaluation (Addendum)
Anesthesia Evaluation  Patient identified by MRN, date of birth, ID band Patient awake    Reviewed: Allergy & Precautions, NPO status , Patient's Chart, lab work & pertinent test results, reviewed documented beta blocker date and time   History of Anesthesia Complications Negative for: history of anesthetic complications  Airway Mallampati: III  TM Distance: >3 FB Neck ROM: Full    Dental  (+) Teeth Intact, Dental Advisory Given   Pulmonary COPD,  COPD inhaler,    Pulmonary exam normal breath sounds clear to auscultation       Cardiovascular hypertension, Pt. on medications and Pt. on home beta blockers + CAD and +CHF  + dysrhythmias (RBBB) Atrial Fibrillation  Rhythm:Irregular Rate:Normal  Stress Test 9/17: The left ventricular ejection fraction is moderately decreased (30-44%). Nuclear stress EF: 39%. There was no ST segment deviation noted during stress. There is a small defect of moderate severity present in the apical lateral and apex location and medium sized defect of moderate severity in the basal inferior, mild inferior and apical inferior location. These defects are fixed and consistent with scar. No ischemia noted. This is an intermediate risk study due to LV dysfunction.   Neuro/Psych negative neurological ROS  negative psych ROS   GI/Hepatic Neg liver ROS, GERD  Medicated,  Endo/Other  Obesity   Renal/GU negative Renal ROS     Musculoskeletal  (+) Arthritis , Osteoarthritis,    Abdominal   Peds  Hematology  (+) Blood dyscrasia (Xarelto), ,   Anesthesia Other Findings Day of surgery medications reviewed with the patient.  Reproductive/Obstetrics                            Anesthesia Physical Anesthesia Plan  ASA: III  Anesthesia Plan: General   Post-op Pain Management:    Induction: Intravenous  Airway Management Planned: Oral ETT  Additional Equipment:    Intra-op Plan:   Post-operative Plan: Extubation in OR  Informed Consent: I have reviewed the patients History and Physical, chart, labs and discussed the procedure including the risks, benefits and alternatives for the proposed anesthesia with the patient or authorized representative who has indicated his/her understanding and acceptance.   Dental advisory given  Plan Discussed with: CRNA  Anesthesia Plan Comments: (Risks/benefits of general anesthesia discussed with patient including risk of damage to teeth, lips, gum, and tongue, nausea/vomiting, allergic reactions to medications, and the possibility of heart attack, stroke and death.  All patient questions answered.  Patient wishes to proceed.)        Anesthesia Quick Evaluation

## 2015-12-19 ENCOUNTER — Ambulatory Visit (HOSPITAL_COMMUNITY): Payer: Medicare Other | Admitting: Anesthesiology

## 2015-12-19 ENCOUNTER — Ambulatory Visit (HOSPITAL_COMMUNITY)
Admission: RE | Admit: 2015-12-19 | Discharge: 2015-12-20 | Disposition: A | Discharge status: Home / Self Care | Payer: Medicare Other | Source: Ambulatory Visit | Attending: Cardiology | Admitting: Cardiology

## 2015-12-19 ENCOUNTER — Encounter (HOSPITAL_COMMUNITY)
Admission: RE | Disposition: A | Discharge status: Home / Self Care | Payer: Self-pay | Source: Ambulatory Visit | Attending: Cardiology

## 2015-12-19 ENCOUNTER — Encounter (HOSPITAL_COMMUNITY): Payer: Self-pay | Admitting: Anesthesiology

## 2015-12-19 DIAGNOSIS — I480 Paroxysmal atrial fibrillation: Secondary | ICD-10-CM

## 2015-12-19 DIAGNOSIS — K219 Gastro-esophageal reflux disease without esophagitis: Secondary | ICD-10-CM | POA: Diagnosis not present

## 2015-12-19 DIAGNOSIS — Z79899 Other long term (current) drug therapy: Secondary | ICD-10-CM | POA: Insufficient documentation

## 2015-12-19 DIAGNOSIS — E669 Obesity, unspecified: Secondary | ICD-10-CM | POA: Insufficient documentation

## 2015-12-19 DIAGNOSIS — Z6829 Body mass index (BMI) 29.0-29.9, adult: Secondary | ICD-10-CM | POA: Diagnosis not present

## 2015-12-19 DIAGNOSIS — I481 Persistent atrial fibrillation: Secondary | ICD-10-CM | POA: Diagnosis not present

## 2015-12-19 DIAGNOSIS — Z7901 Long term (current) use of anticoagulants: Secondary | ICD-10-CM | POA: Diagnosis not present

## 2015-12-19 DIAGNOSIS — I10 Essential (primary) hypertension: Secondary | ICD-10-CM | POA: Insufficient documentation

## 2015-12-19 DIAGNOSIS — M1990 Unspecified osteoarthritis, unspecified site: Secondary | ICD-10-CM | POA: Diagnosis not present

## 2015-12-19 DIAGNOSIS — I483 Typical atrial flutter: Secondary | ICD-10-CM | POA: Diagnosis not present

## 2015-12-19 DIAGNOSIS — J449 Chronic obstructive pulmonary disease, unspecified: Secondary | ICD-10-CM | POA: Diagnosis not present

## 2015-12-19 DIAGNOSIS — I2510 Atherosclerotic heart disease of native coronary artery without angina pectoris: Secondary | ICD-10-CM | POA: Insufficient documentation

## 2015-12-19 HISTORY — PX: ATRIAL FIBRILLATION ABLATION: EP1191

## 2015-12-19 HISTORY — PX: ELECTROPHYSIOLOGIC STUDY: SHX172A

## 2015-12-19 LAB — POCT ACTIVATED CLOTTING TIME
ACTIVATED CLOTTING TIME: 384 s
ACTIVATED CLOTTING TIME: 153 s
Activated Clotting Time: 340 seconds

## 2015-12-19 SURGERY — ATRIAL FIBRILLATION ABLATION
Anesthesia: General

## 2015-12-19 MED ORDER — ROCURONIUM BROMIDE 100 MG/10ML IV SOLN
INTRAVENOUS | Status: DC | PRN
  Administered 2015-12-19: 60 mg via INTRAVENOUS

## 2015-12-19 MED ORDER — HEPARIN SODIUM (PORCINE) 1000 UNIT/ML IJ SOLN
INTRAMUSCULAR | Status: AC
  Filled 2015-12-19: qty 1

## 2015-12-19 MED ORDER — BUPIVACAINE HCL (PF) 0.25 % IJ SOLN
INTRAMUSCULAR | Status: AC
  Filled 2015-12-19: qty 60

## 2015-12-19 MED ORDER — PANTOPRAZOLE SODIUM 40 MG PO TBEC
80.0000 mg | DELAYED_RELEASE_TABLET | Freq: Every day | ORAL | Status: DC
  Administered 2015-12-19 – 2015-12-20 (×2): 80 mg via ORAL
  Filled 2015-12-19 (×2): qty 2

## 2015-12-19 MED ORDER — HEPARIN SODIUM (PORCINE) 1000 UNIT/ML IJ SOLN
INTRAMUSCULAR | Status: DC | PRN
  Administered 2015-12-19: 13000 [IU] via INTRAVENOUS

## 2015-12-19 MED ORDER — ONDANSETRON HCL 4 MG/2ML IJ SOLN: 4.0000 mg | Freq: Once | INTRAMUSCULAR | Status: DC | PRN

## 2015-12-19 MED ORDER — FENTANYL CITRATE (PF) 100 MCG/2ML IJ SOLN
INTRAMUSCULAR | Status: DC | PRN
  Administered 2015-12-19: 50 ug via INTRAVENOUS
  Administered 2015-12-19: 100 ug via INTRAVENOUS
  Administered 2015-12-19: 50 ug via INTRAVENOUS
  Administered 2015-12-19: 200 ug via INTRAVENOUS

## 2015-12-19 MED ORDER — SODIUM CHLORIDE 0.9 % IV SOLN
INTRAVENOUS | Status: DC | PRN
  Administered 2015-12-19: 12:00:00 via INTRAVENOUS

## 2015-12-19 MED ORDER — HEPARIN (PORCINE) IN NACL 2-0.9 UNIT/ML-% IJ SOLN
INTRAMUSCULAR | Status: AC
  Filled 2015-12-19: qty 500

## 2015-12-19 MED ORDER — EZETIMIBE 10 MG PO TABS
10.0000 mg | ORAL_TABLET | Freq: Every day | ORAL | Status: DC
  Administered 2015-12-19 – 2015-12-20 (×2): 10 mg via ORAL
  Filled 2015-12-19 (×2): qty 1

## 2015-12-19 MED ORDER — METOPROLOL SUCCINATE ER 50 MG PO TB24
150.0000 mg | ORAL_TABLET | Freq: Every day | ORAL | Status: DC
  Administered 2015-12-19 – 2015-12-20 (×2): 150 mg via ORAL
  Filled 2015-12-19 (×2): qty 3

## 2015-12-19 MED ORDER — ONDANSETRON HCL 4 MG/2ML IJ SOLN
INTRAMUSCULAR | Status: DC | PRN
  Administered 2015-12-19: 4 mg via INTRAVENOUS

## 2015-12-19 MED ORDER — MIDAZOLAM HCL 5 MG/5ML IJ SOLN
INTRAMUSCULAR | Status: DC | PRN
  Administered 2015-12-19 (×2): 1 mg via INTRAVENOUS

## 2015-12-19 MED ORDER — HEPARIN SODIUM (PORCINE) 1000 UNIT/ML IJ SOLN
INTRAMUSCULAR | Status: DC | PRN
  Administered 2015-12-19: 1000 [IU] via INTRAVENOUS

## 2015-12-19 MED ORDER — METOPROLOL SUCCINATE ER 100 MG PO TB24: 100.0000 mg | ORAL_TABLET | Freq: Every day | ORAL | Status: DC

## 2015-12-19 MED ORDER — PROTAMINE SULFATE 10 MG/ML IV SOLN
INTRAVENOUS | Status: DC | PRN
  Administered 2015-12-19: 40 mg via INTRAVENOUS

## 2015-12-19 MED ORDER — PHENYLEPHRINE HCL 10 MG/ML IJ SOLN
INTRAVENOUS | Status: DC | PRN
  Administered 2015-12-19: 25 ug/min via INTRAVENOUS

## 2015-12-19 MED ORDER — LOSARTAN POTASSIUM 50 MG PO TABS
100.0000 mg | ORAL_TABLET | Freq: Every day | ORAL | Status: DC
  Administered 2015-12-19 – 2015-12-20 (×2): 100 mg via ORAL
  Filled 2015-12-19 (×2): qty 2

## 2015-12-19 MED ORDER — NAPROXEN SODIUM 275 MG PO TABS: 275.0000 mg | ORAL_TABLET | Freq: Every day | ORAL | Status: DC | PRN

## 2015-12-19 MED ORDER — LACTATED RINGERS IV SOLN
INTRAVENOUS | Status: DC | PRN
  Administered 2015-12-19: 08:00:00 via INTRAVENOUS

## 2015-12-19 MED ORDER — FENTANYL CITRATE (PF) 100 MCG/2ML IJ SOLN: 25.0000 ug | INTRAMUSCULAR | Status: DC | PRN

## 2015-12-19 MED ORDER — SUCCINYLCHOLINE CHLORIDE 20 MG/ML IJ SOLN
INTRAMUSCULAR | Status: DC | PRN
  Administered 2015-12-19: 100 mg via INTRAVENOUS

## 2015-12-19 MED ORDER — SODIUM CHLORIDE 0.9 % IV SOLN: 250.0000 mL | INTRAVENOUS | Status: DC | PRN

## 2015-12-19 MED ORDER — RIVAROXABAN 20 MG PO TABS
20.0000 mg | ORAL_TABLET | Freq: Every day | ORAL | Status: DC
  Administered 2015-12-19: 20 mg via ORAL
  Filled 2015-12-19: qty 1

## 2015-12-19 MED ORDER — DIPHENHYDRAMINE HCL 25 MG PO CAPS
25.0000 mg | ORAL_CAPSULE | Freq: Every evening | ORAL | Status: DC | PRN
  Administered 2015-12-19: 25 mg via ORAL
  Filled 2015-12-19: qty 1

## 2015-12-19 MED ORDER — SODIUM CHLORIDE 0.9% FLUSH
3.0000 mL | Freq: Two times a day (BID) | INTRAVENOUS | Status: DC
  Administered 2015-12-19 – 2015-12-20 (×2): 3 mL via INTRAVENOUS

## 2015-12-19 MED ORDER — LIDOCAINE HCL (CARDIAC) 20 MG/ML IV SOLN
INTRAVENOUS | Status: DC | PRN
  Administered 2015-12-19: 100 mg via INTRAVENOUS

## 2015-12-19 MED ORDER — VITAMIN D 1000 UNITS PO TABS
2000.0000 [IU] | ORAL_TABLET | Freq: Every day | ORAL | Status: DC
  Administered 2015-12-19 – 2015-12-20 (×2): 2000 [IU] via ORAL
  Filled 2015-12-19 (×2): qty 2

## 2015-12-19 MED ORDER — PROPOFOL 10 MG/ML IV BOLUS
INTRAVENOUS | Status: DC | PRN
  Administered 2015-12-19: 70 mg via INTRAVENOUS
  Administered 2015-12-19: 200 mg via INTRAVENOUS

## 2015-12-19 MED ORDER — SUGAMMADEX SODIUM 200 MG/2ML IV SOLN
INTRAVENOUS | Status: DC | PRN
  Administered 2015-12-19: 200 mg via INTRAVENOUS

## 2015-12-19 MED ORDER — BUPIVACAINE HCL (PF) 0.25 % IJ SOLN
INTRAMUSCULAR | Status: DC | PRN
  Administered 2015-12-19: 60 mL

## 2015-12-19 MED ORDER — TIOTROPIUM BROMIDE MONOHYDRATE 1.25 MCG/ACT IN AERS: 2.0000 | INHALATION_SPRAY | Freq: Every day | RESPIRATORY_TRACT | Status: DC

## 2015-12-19 MED ORDER — HEPARIN (PORCINE) IN NACL 2-0.9 UNIT/ML-% IJ SOLN
INTRAMUSCULAR | Status: DC | PRN
  Administered 2015-12-19: 09:00:00

## 2015-12-19 MED ORDER — DOBUTAMINE IN D5W 4-5 MG/ML-% IV SOLN
INTRAVENOUS | Status: AC
  Filled 2015-12-19: qty 250

## 2015-12-19 MED ORDER — ACETAMINOPHEN 325 MG PO TABS: 650.0000 mg | ORAL_TABLET | ORAL | Status: DC | PRN

## 2015-12-19 MED ORDER — SODIUM CHLORIDE 0.9% FLUSH: 3.0000 mL | INTRAVENOUS | Status: DC | PRN

## 2015-12-19 MED ORDER — DOBUTAMINE IN D5W 4-5 MG/ML-% IV SOLN
INTRAVENOUS | Status: DC | PRN
  Administered 2015-12-19: 20 ug/kg/min via INTRAVENOUS

## 2015-12-19 MED ORDER — ATORVASTATIN CALCIUM 40 MG PO TABS
40.0000 mg | ORAL_TABLET | Freq: Every day | ORAL | Status: DC
  Administered 2015-12-19 – 2015-12-20 (×2): 40 mg via ORAL
  Filled 2015-12-19: qty 1

## 2015-12-19 MED ORDER — VITAMIN D3 50 MCG (2000 UT) PO TABS: 2000.0000 [IU] | ORAL_TABLET | Freq: Every day | ORAL | Status: DC

## 2015-12-19 MED ORDER — ONDANSETRON HCL 4 MG/2ML IJ SOLN: 4.0000 mg | Freq: Four times a day (QID) | INTRAMUSCULAR | Status: DC | PRN

## 2015-12-19 MED ORDER — FUROSEMIDE 20 MG PO TABS
20.0000 mg | ORAL_TABLET | Freq: Every day | ORAL | Status: DC
  Administered 2015-12-19 – 2015-12-20 (×2): 20 mg via ORAL
  Filled 2015-12-19 (×2): qty 1

## 2015-12-19 MED ORDER — POTASSIUM CHLORIDE CRYS ER 10 MEQ PO TBCR
10.0000 meq | EXTENDED_RELEASE_TABLET | Freq: Two times a day (BID) | ORAL | Status: DC
  Administered 2015-12-19 – 2015-12-20 (×2): 10 meq via ORAL
  Filled 2015-12-19 (×2): qty 1

## 2015-12-19 SURGICAL SUPPLY — 19 items
BAG SNAP BAND KOVER 36X36 (MISCELLANEOUS) ×3 IMPLANT
BLANKET WARM UNDERBOD FULL ACC (MISCELLANEOUS) ×3 IMPLANT
CATH SMTCH THERMOCOOL SF DF (CATHETERS) ×3 IMPLANT
CATH SOUNDSTAR 3D IMAGING (CATHETERS) ×3 IMPLANT
CATH VARIABLE LASSO NAV 2515 (CATHETERS) ×3 IMPLANT
CATH WEBSTER BI DIR CS D-F CRV (CATHETERS) ×3 IMPLANT
COVER SWIFTLINK CONNECTOR (BAG) ×3 IMPLANT
NEEDLE TRANSSEPTAL BRK 98CM (NEEDLE) ×3 IMPLANT
PACK EP LATEX FREE (CUSTOM PROCEDURE TRAY) ×2
PACK EP LF (CUSTOM PROCEDURE TRAY) ×1 IMPLANT
PAD DEFIB LIFELINK (PAD) ×3 IMPLANT
PATCH CARTO3 (PAD) ×3 IMPLANT
SHEATH AGILIS NXT 8.5F 71CM (SHEATH) ×6 IMPLANT
SHEATH AVANTI 11F 11CM (SHEATH) ×3 IMPLANT
SHEATH PINNACLE 7F 10CM (SHEATH) ×3 IMPLANT
SHEATH PINNACLE 8F 10CM (SHEATH) ×6 IMPLANT
SHEATH PINNACLE 9F 10CM (SHEATH) ×6 IMPLANT
SHIELD RADPAD SCOOP 12X17 (MISCELLANEOUS) ×3 IMPLANT
TUBING SMART ABLATE COOLFLOW (TUBING) ×3 IMPLANT

## 2015-12-19 NOTE — Anesthesia Procedure Notes (Signed)
Procedure Name: Intubation Date/Time: 12/19/2015 8:52 AM Performed by: Rebekah Chesterfield L Pre-anesthesia Checklist: Patient identified, Emergency Drugs available, Suction available and Patient being monitored Patient Re-evaluated:Patient Re-evaluated prior to inductionOxygen Delivery Method: Circle System Utilized Preoxygenation: Pre-oxygenation with 100% oxygen Intubation Type: IV induction Ventilation: Mask ventilation without difficulty and Oral airway inserted - appropriate to patient size Tube type: Oral Tube size: 7.5 mm Number of attempts: 1 Airway Equipment and Method: Stylet and Oral airway Placement Confirmation: ETT inserted through vocal cords under direct vision,  positive ETCO2 and breath sounds checked- equal and bilateral Secured at: 23 cm Tube secured with: Tape Dental Injury: Teeth and Oropharynx as per pre-operative assessment

## 2015-12-19 NOTE — Transfer of Care (Signed)
Immediate Anesthesia Transfer of Care Note  Patient: Theodore [name]  Procedure(s) Performed: Procedure(s): Atrial Fibrillation Ablation (N/A)  Patient Location: PACU and Cath Lab  Anesthesia Type:General  Level of Consciousness: awake, alert , oriented and patient cooperative  Airway & Oxygen Therapy: Patient Spontanous Breathing and Patient connected to nasal cannula oxygen  Post-op Assessment: Report given to RN, Post -op Vital signs reviewed and stable and Patient moving all extremities X 4  Post vital signs: Reviewed and stable  Last Vitals:  Vitals:   12/19/15 0637 12/19/15 1141  BP: (!) 153/79   Pulse: 63   Temp: 37 C 36.1 C    Last Pain:  Vitals:   12/19/15 1141  TempSrc: Tympanic         Complications: No apparent anesthesia complications

## 2015-12-19 NOTE — Progress Notes (Signed)
Report and care transferred to [facility]. 83F and 64F sheaths pulled from Lt Femoral Vein sites. Manual pressure held for 20 minutes. Lt groin site soft with no active bleeding or hematoma noted. VSS. 4x4 and tegaderm applied to Lt femoral site. Post procedure bleeding precautions reviewed with patient. Bilateral DP/PT palpable 2+. Patient alert and oriented. Resting comfortably.

## 2015-12-19 NOTE — Progress Notes (Signed)
Pt states his breathing is "fine", pt does not want to use his inhaler- he states he "doesn't need it".  Pt is aware that if he changes his mind, to let RN know so RT can be notified.

## 2015-12-19 NOTE — Anesthesia Postprocedure Evaluation (Signed)
Anesthesia Post Note  Patient: Theodore [name]  Procedure(s) Performed: Procedure(s) (LRB): Atrial Fibrillation Ablation (N/A)  Patient location during evaluation: PACU Anesthesia Type: General Level of consciousness: awake Pain management: pain level controlled Vital Signs Assessment: post-procedure vital signs reviewed and stable Respiratory status: spontaneous breathing Cardiovascular status: stable Postop Assessment: no signs of nausea or vomiting Anesthetic complications: no    Last Vitals:  Vitals:   12/19/15 1600 12/19/15 1616  BP: (!) 155/83 (!) 155/83  Pulse: 65 66  Resp: 14 14  Temp:  36.6 C    Last Pain:  Vitals:   12/19/15 1616  TempSrc: Oral                 Nashonda Limberg

## 2015-12-19 NOTE — Progress Notes (Signed)
Patient being transported to [facility]. Bilateral Groins soft with no active bleeding or hematoma noted.

## 2015-12-19 NOTE — H&P (Signed)
Theodore [name] is a 81 y.o. male with a history of persistent atrial fibrillation.  He also has a history of CAD. He presents today for AF ablation.  On exam, regular rhythm, no murmurs, lungs clear.  Risks and benefits of ablation were discussed with the patient and family.  Risks include but not limited to bleeding, tamponade, heart block stroke, and damage to surrounding organs.  He understands these risks and has agreed to the procedure.  Cristiana Yochim [name], MD 12/19/2015 7:07 AM

## 2015-12-19 NOTE — Progress Notes (Signed)
Site area: rt groin Site Prior to Removal:  Level 0 Pressure Applied For:  20 minutes Manual:   yes Patient Status During Pull:  Stable   Post Pull Site:  Level 0 Post Pull Instructions Given:  yes Post Pull Pulses Present: yes Dressing Applied:  Small tegaderm Bedrest begins @  Comments:

## 2015-12-20 DIAGNOSIS — I483 Typical atrial flutter: Secondary | ICD-10-CM | POA: Diagnosis not present

## 2015-12-20 DIAGNOSIS — J449 Chronic obstructive pulmonary disease, unspecified: Secondary | ICD-10-CM | POA: Diagnosis not present

## 2015-12-20 DIAGNOSIS — I480 Paroxysmal atrial fibrillation: Secondary | ICD-10-CM | POA: Diagnosis not present

## 2015-12-20 DIAGNOSIS — I2510 Atherosclerotic heart disease of native coronary artery without angina pectoris: Secondary | ICD-10-CM | POA: Diagnosis not present

## 2015-12-20 NOTE — Discharge Summary (Signed)
ELECTROPHYSIOLOGY PROCEDURE DISCHARGE SUMMARY    Patient ID: Theodore [name],  MRN: [id number], DOB/AGE: October 02, 1934 81 y.o.  Admit date: 12/19/2015 Discharge date: 12/20/2015  Primary Care Physician: [name], MD  Primary Cardiologist: Dr. [name] Electrophysiologist: Dr. [name]  Primary Discharge Diagnosis:  1. Persistent AFib     CHA2DS2Vasc is at least 3 on Xarelto  Secondary Discharge Diagnosis:  1. HTN  Procedures This Admission:  1.  Electrophysiology study and radiofrequency catheter ablation on 12/19/15 by Dr. [name]  This study demonstrated  CONCLUSIONS: 1. Sinus rhythm upon presentation.   2. Successful electrical isolation and anatomical encircling of all four pulmonary veins with radiofrequency current.    3. Cavo-tricuspid isthmus ablation was performed with complete bidirectional isthmus block achieved.  4. No inducible arrhythmias following ablation both on and off of  dobutamine 5. No early apparent complications.  Brief HPI: Theodore [name] is a 81 y.o. male with a history of persistent atrial fibrillation.  They have failed medical therapy with diltiazem, metoprolol. Risks, benefits, and alternatives to catheter ablation of atrial fibrillation were reviewed with the patient who wished to proceed.  The patient underwent cardiac CT prior to the procedure which demonstrated no LAA thrombus.    Hospital Course:  The patient was admitted and underwent EPS/RFCA of atrial fibrillation with details as outlined above.  They were monitored on telemetry overnight which demonstrated SR.  Groin was without complication on the day of discharge.  The patient was examined by Dr. [name] and considered to be stable for discharge.  Wound care and restrictions were reviewed with the patient.  The patient Theodore [name] be seen back by Roderic Palau, NP in 4 weeks and Dr [name] in 12 weeks for post ablation follow up.     Physical Exam: Vitals:   12/19/15 1951 12/19/15 2340  12/20/15 0352 12/20/15 0745  BP: 139/82  (!) 94/53 133/76  Pulse: 78 76 66 69  Resp: 17 15 13 15   Temp: 97.8 F (36.6 C)   98.8 F (37.1 C)  TempSrc: Oral   Oral  SpO2: 96% 93% 94% 93%  Weight:      Height:        GEN- The patient is well appearing, alert and oriented x 3 today.   HEENT: normocephalic, atraumatic; sclera clear, conjunctiva pink; hearing intact; oropharynx clear; neck supple  Lungs- Clear to ausculation bilaterally, normal work of breathing.  No wheezes, rales, rhonchi Heart- Regular rate and rhythm, no murmurs, rubs or gallops  GI- soft, non-tender, non-distended  Extremities- no clubbing, cyanosis, or edema; DP/PT/radial pulses 2+ bilaterally, groin without hematoma/bruit MS- no significant deformity or atrophy Skin- warm and dry, no rash or lesion Psych- euthymic mood, full affect Neuro- strength and sensation are intact   Labs:   Lab Results  Component Value Date   WBC 6.8 12/05/2015   HGB 16.0 12/05/2015   HCT 44.4 12/05/2015   MCV 91.5 12/05/2015   PLT 143 12/05/2015   No results for input(s): NA, K, CL, CO2, BUN, CREATININE, CALCIUM, PROT, BILITOT, ALKPHOS, ALT, AST, GLUCOSE in the last 168 hours.  Invalid input(s): LABALBU   Discharge Medications:    Medication List    TAKE these medications   atorvastatin 40 MG tablet Commonly known as:  LIPITOR Take 1 tablet (40 mg total) by mouth daily.   diltiazem 180 MG 24 hr capsule Commonly known as:  CARTIA XT Take 1 capsule (180 mg total) by mouth daily.   ezetimibe 10 MG  tablet Commonly known as:  ZETIA Take 1 tablet (10 mg total) by mouth daily.   furosemide 20 MG tablet Commonly known as:  LASIX Take 1 tablet (20 mg total) by mouth daily.   losartan 100 MG tablet Commonly known as:  COZAAR Take 1 tablet (100 mg total) by mouth daily.   LYSINE PO Take 1 tablet by mouth daily.   metoprolol succinate 100 MG 24 hr tablet Commonly known as:  TOPROL-XL TAKE 1 TABLET (100 MG TOTAL) BY  MOUTH DAILY. TAKE WITH OR IMMEDIATELY FOLLOWING A MEAL.   metoprolol succinate 50 MG 24 hr tablet Commonly known as:  TOPROL-XL TAKE 1 TABLET (50 MG TOTAL) BY MOUTH DAILY. TAKE WITH OR IMMEDIATELY FOLLOWING A MEAL.   naproxen sodium 220 MG tablet Commonly known as:  ANAPROX Take 220 mg by mouth daily as needed (for aches/pain.).   omeprazole 20 MG capsule Commonly known as:  PRILOSEC Take 20 mg by mouth daily.   potassium chloride 10 MEQ tablet Commonly known as:  K-DUR,KLOR-CON Take 10 mEq by mouth 2 (two) times daily.   rivaroxaban 20 MG Tabs tablet Commonly known as:  XARELTO Take 1 tablet (20 mg total) by mouth daily with supper.   Tiotropium Bromide Monohydrate 1.25 MCG/ACT Aers Commonly known as:  SPIRIVA RESPIMAT Inhale 2 puffs into the lungs daily.   VITAMIN C PO Take 1 tablet by mouth daily.   Vitamin D3 2000 units Tabs Take 2,000 Units by mouth daily.       Disposition:  Home Discharge Instructions    Diet - low sodium heart healthy    Complete by:  As directed    Increase activity slowly    Complete by:  As directed      Follow-up Information    [facility] Follow up on 01/17/2016.   Specialty:  Cardiology Why:  9:30AM Contact information: [address] [id number] [place] [phone]       Zoiee Wimmer Meredith Leeds, MD Follow up on 03/24/2016.   Specialty:  Cardiology Why:  9:30AM Contact information: [place] [address] [phone]           Duration of Discharge Encounter: Greater than 30 minutes including physician time.  SignedTommye Standard, NP 12/20/2015 7:54 AM  I have seen and examined this patient with Tommye Standard.  Agree with above, note added to reflect my findings.  On exam, regular rhythm, no murmurs, lungs clear. Had ablation for atrial fibrillation/flutter.  No further arrhythmia overnight.  Plan for discharge today with follow up in AF  clinic.    Theodore [name] M. Krrish Freund MD 12/20/2015 7:54 AM

## 2015-12-20 NOTE — Discharge Instructions (Signed)
No driving for 1 week. No lifting over 5 lbs for 1 week. No vigorous or sexual activity for 1 week. You may return to work on 12/26/15. Keep procedure site clean & dry. If you notice increased pain, swelling, bleeding or pus, call/return!  You may shower, but no soaking baths/hot tubs/pools for 1 week.

## 2015-12-22 ENCOUNTER — Encounter: Payer: Self-pay | Admitting: Cardiology

## 2015-12-26 ENCOUNTER — Other Ambulatory Visit: Payer: Self-pay | Admitting: Cardiovascular Disease

## 2015-12-26 NOTE — Telephone Encounter (Signed)
Please review for refill. Thanks!  

## 2015-12-27 ENCOUNTER — Encounter: Payer: Self-pay | Admitting: *Deleted

## 2016-01-15 ENCOUNTER — Encounter: Payer: Medicare Other | Admitting: Family Medicine

## 2016-01-15 ENCOUNTER — Other Ambulatory Visit: Payer: Medicare Other

## 2016-01-17 ENCOUNTER — Encounter (HOSPITAL_COMMUNITY): Payer: Self-pay | Admitting: Nurse Practitioner

## 2016-01-17 ENCOUNTER — Ambulatory Visit (HOSPITAL_COMMUNITY)
Admission: RE | Admit: 2016-01-17 | Discharge: 2016-01-17 | Disposition: A | Discharge status: Home / Self Care | Payer: Medicare Other | Source: Ambulatory Visit | Attending: Nurse Practitioner | Admitting: Nurse Practitioner

## 2016-01-17 VITALS — BP 158/90 | HR 64 | Ht 72.0 in | Wt 223.0 lb

## 2016-01-17 DIAGNOSIS — Z79899 Other long term (current) drug therapy: Secondary | ICD-10-CM | POA: Insufficient documentation

## 2016-01-17 DIAGNOSIS — Z9889 Other specified postprocedural states: Secondary | ICD-10-CM | POA: Insufficient documentation

## 2016-01-17 DIAGNOSIS — I481 Persistent atrial fibrillation: Secondary | ICD-10-CM | POA: Diagnosis not present

## 2016-01-17 DIAGNOSIS — Z880 Allergy status to penicillin: Secondary | ICD-10-CM | POA: Diagnosis not present

## 2016-01-17 DIAGNOSIS — I4819 Other persistent atrial fibrillation: Secondary | ICD-10-CM

## 2016-01-17 DIAGNOSIS — Z5189 Encounter for other specified aftercare: Secondary | ICD-10-CM | POA: Insufficient documentation

## 2016-01-17 NOTE — Progress Notes (Signed)
Primary Care Physician: [name], MD Referring Physician:Dr. Rosalie Akey is a 81 y.o. male with a h/o persistent afib that under went afib ablation 11/7 with Dr. [name]. He has done well without any evidence of afib . He denies any swallowing or groin issues.  Today, he denies symptoms of palpitations, chest pain, shortness of breath, orthopnea, PND, lower extremity edema, dizziness, presyncope, syncope, or neurologic sequela. The patient is tolerating medications without difficulties and is otherwise without complaint today.   Past Medical History:  Diagnosis Date  . History of arthritis   . History of rheumatic fever   . Hypercholesterolemia   . Hypertension   . Nocturia   . PAF (paroxysmal atrial fibrillation) [facility]) January 2013   Placed on Pradaxa. Did not require cardioversion; spontaneously converted  . Peripheral edema   . Right bundle branch block   . SOB (shortness of breath)   . Tendonitis of elbow, left    Past Surgical History:  Procedure Laterality Date  . BACK SURGERY  12/24/09   fusion C3-C4  . BACK SURGERY  2010   fusion L4-L5  . CARDIAC CATHETERIZATION  2009   NONOBSTRUCTIVE ATHERSCLEROTIC CORONARY DISEASE AND NORMAL  LV FUNCTION  . CARDIAC CATHETERIZATION N/A 09/08/2014   Procedure: Left Heart Cath and Coronary Angiography;  Surgeon: Belva Crome, MD;  Location: [place] [facility];  Service: Cardiovascular;  Laterality: N/A;  . CARDIAC CATHETERIZATION N/A 09/28/2015   Procedure: Left Heart Cath and Coronary Angiography;  Surgeon: Leonie Man, MD;  Location: [place] [facility];  Service: Cardiovascular;  Laterality: N/A;  . CARDIAC CATHETERIZATION N/A 09/28/2015   Procedure: Intravascular Pressure Wire/FFR Study;  Surgeon: Leonie Man, MD;  Location: [place] [facility];  Service: Cardiovascular;  Laterality: N/A;  . CARDIOVERSION N/A 10/23/2015   Procedure: CARDIOVERSION;  Surgeon: [name], MD;  Location: [facility] ENDOSCOPY;   Service: Cardiovascular;  Laterality: N/A;  . COLONOSCOPY  2013   per patient, rpt 5 yrs  . ELECTROPHYSIOLOGIC STUDY N/A 12/19/2015   Procedure: Atrial Fibrillation Ablation;  Surgeon: [name], MD;  Location: [facility];  Service: Cardiovascular;  Laterality: N/A;  . KNEE SURGERY Right 2007  . PATELLAR TENDON REPAIR Left 2008  . TRICEPS TENDON REPAIR Left 2013    Current Outpatient Prescriptions  Medication Sig Dispense Refill  . Ascorbic Acid (VITAMIN C PO) Take 1 tablet by mouth daily.     [name] atorvastatin (LIPITOR) 40 MG tablet Take 1 tablet (40 mg total) by mouth daily. 90 tablet 3  . Cholecalciferol (VITAMIN D3) 2000 units TABS Take 2,000 Units by mouth daily.    [name] diltiazem (CARTIA XT) 180 MG 24 hr capsule Take 1 capsule (180 mg total) by mouth daily. 90 capsule 3  . ezetimibe (ZETIA) 10 MG tablet Take 1 tablet (10 mg total) by mouth daily. 90 tablet 3  . furosemide (LASIX) 20 MG tablet Take 1 tablet (20 mg total) by mouth daily. 90 tablet 3  . losartan (COZAAR) 100 MG tablet Take 1 tablet (100 mg total) by mouth daily. 90 tablet 3  . LYSINE PO Take 1 tablet by mouth daily.     . metoprolol succinate (TOPROL-XL) 100 MG 24 hr tablet TAKE 1 TABLET (100 MG TOTAL) BY MOUTH DAILY. TAKE WITH OR IMMEDIATELY FOLLOWING A MEAL. 30 tablet 0  . metoprolol succinate (TOPROL-XL) 50 MG 24 hr tablet TAKE 1 TABLET (50 MG TOTAL) BY MOUTH DAILY. TAKE  WITH OR IMMEDIATELY FOLLOWING A MEAL. 30 tablet 0  . naproxen sodium (ANAPROX) 220 MG tablet Take 220 mg by mouth daily as needed (for aches/pain.).     [name] omeprazole (PRILOSEC) 20 MG capsule Take 20 mg by mouth daily.      . potassium chloride (K-DUR,KLOR-CON) 10 MEQ tablet Take 10 mEq by mouth 2 (two) times daily.    . rivaroxaban (XARELTO) 20 MG TABS tablet Take 1 tablet (20 mg total) by mouth daily with supper. 90 tablet 1  . Tiotropium Bromide Monohydrate (SPIRIVA RESPIMAT) 1.25 MCG/ACT AERS Inhale 2 puffs into the lungs daily. 3  Inhaler 3   No current facility-administered medications for this encounter.     Allergies  Allergen Reactions  . Penicillins Rash    "Blistering rash" Has patient had a PCN reaction causing immediate rash, facial/tongue/throat swelling, SOB or lightheadedness with hypotension: Yes Has patient had a PCN reaction causing severe rash involving mucus membranes or skin necrosis: No Has patient had a PCN reaction that required hospitalization: No Has patient had a PCN reaction occurring within the last 10 years: No If all of the above answers are "NO", then may proceed with Cephalosporin use.   . Amlodipine     Peripheral edema  . Lisinopril Cough  . Zocor [Simvastatin]     LFT increase with simvastatin and lovastatin   . Tamiflu Rash    Rash,itching    Social History   Social History  . Marital status: Married    Spouse name: N/A  . Number of children: N/A  . Years of education: N/A   Occupational History  . retired Retired    Art gallery manager   Social History Main Topics  . Smoking status: Never Smoker  . Smokeless tobacco: Never Used  . Alcohol use 1.2 oz/week    2 Cans of beer per week     Comment: 2 beers daily  . Drug use: No  . Sexual activity: No   Other Topics Concern  . Not on file   Social History Narrative   Lives with wife, no pets   Retired   Occ: Art gallery manager   Edu: master's   Activity: golf   Diet: good water, fruits/vegetables daily    Family History  Problem Relation Age of Onset  . Heart attack Father   . COPD Father   . Hypertension Father   . Heart disease Brother   . COPD Brother   . Asthma Sister   . Stroke Neg Hx     ROS- All systems are reviewed and negative except as per the HPI above  Physical Exam: Vitals:   01/17/16 0934  BP: (!) 158/90  Pulse: 64  Weight: 223 lb (101.2 kg)  Height: 6' (1.829 m)   Wt Readings from Last 3 Encounters:  01/17/16 223 lb (101.2 kg)  12/19/15 220 lb (99.8 kg)  12/05/15 222  lb 3.2 oz (100.8 kg)    Labs: Lab Results  Component Value Date   NA 134 (L) 12/05/2015   K 4.3 12/05/2015   CL 99 12/05/2015   CO2 26 12/05/2015   GLUCOSE 113 (H) 12/05/2015   BUN 15 12/05/2015   CREATININE 1.01 12/05/2015   CALCIUM 9.3 12/05/2015   MG 1.8 02/12/2011   Lab Results  Component Value Date   INR 1.2 (H) 09/25/2015   Lab Results  Component Value Date   CHOL 126 10/30/2015   HDL 54 10/30/2015   LDLCALC 59 10/30/2015   TRIG  67 10/30/2015     GEN- The patient is well appearing, alert and oriented x 3 today.   Head- normocephalic, atraumatic Eyes-  Sclera clear, conjunctiva pink Ears- hearing intact Oropharynx- clear Neck- supple, no JVP Lymph- no cervical lymphadenopathy Lungs- Clear to ausculation bilaterally, normal work of breathing Heart- Regular rate and rhythm, no murmurs, rubs or gallops, PMI not laterally displaced GI- soft, NT, ND, + BS Extremities- no clubbing, cyanosis, or edema MS- no significant deformity or atrophy Skin- no rash or lesion Psych- euthymic mood, full affect Neuro- strength and sensation are intact  EKG-NSR at 64 bpm, LAD, RBBB, pr int 218 ms, qrs int 154 ms, qtc 772 ms Epic records reviewed    Assessment and Plan: 1. Persistent afib s/p ablation Doing well and staying in SR No apparent complications Resume normal activites Continue xarelto Continue BB, cardizem  F/u with Dr. [name] 2/12 afib as needed  Geroge Baseman. Korie Streat, [facility] [address] [place], [place] [address] [phone]

## 2016-01-22 ENCOUNTER — Encounter: Payer: Medicare Other | Admitting: Family Medicine

## 2016-01-29 NOTE — Progress Notes (Signed)
Cardiology Office Note   Date:  01/30/2016   ID:  Theodore [name], DOB 09/08/1934, Theodore [name]  PCP:  [name], MD  Cardiologist:   Skeet Latch, MD   Chief Complaint  Patient presents with  . Shortness of Breath    has decrease, very minor  . Edema    minor in legs at the end of the day.     History of Present Illness: Theodore [name] is a 81 y.o. male with hypertension, hyperlipidemia, paroxysmal atrial fibrillation, asthma and Rheumatic fever who presents for follow up.  Theodore [name] was previously a patient of Dr. Mare Ferrari.  Theodore [name] was diagnosed with atrial fibrillation in 2014.  He was initially on Pradaxa and switched to Xarelto.  He had an episode of chest pain 08/2014 and had a Lexiscan Myoview 08/31/14 that showed an old inferior scar with peri-infarct ischemia and LVEF 49%.  He then had a LHC that revealed 70% ostial LAD, 50% prox LAD, 30% OM1 and 35% RCA.  He was managed medically.  He subsequently had an echo 09/2014 that showed LVEF 55-60% with mild MR and AR and grade 1 diastolic dysfunction.  At that time atorvastatin was increased to 80 mg but he noted leg pain.  He was seen in the lipid clinic and started back on atorvastatin 40mg  and zetia 10 mg.    Theodore [name] was seen in clinic 09/25/15 and reported significant shortness of breath with minimal exertion.  He had a LHC with Dr. Ellyn Hack on 09/28/15 that again revealed a 70% ostial-proximal LAD lesion.  Dr. Ellyn Hack performed FFR on that lesion and there was no significant change.  However, there was some concern regarding the accuracy of the result. The left ventriculogram performed at that time revealed LVEF 35-45%.  Theodore [name] continued to have exertional dyspnea so he was referred for [facility] 10/17/15 that was negative for ischemia. He subsequently underwent DCCV on 10/23/15.  After cardioversion he felt much better. However, he was afraid to do much because he thought that he would go back into atrial  fibrillation. He underwent ablation on 12/19/15 with Dr. [name].  Since the ablation he has been feeling well.  He denies palpitations.  He doesn't think that he has been in atrial fibrillation since his cardioversion.  He notes that his energy levels are improved. He denies any shortness of breath or fatigue.  He continues to have nagging discomfort in his L chest at times.  It occurs once every few days when he is active.  It lasts for a few seconds at a time and there is no associated shortness of breath, nausea, or diaphoresis. He does note occasional, mild lower extremity edema at the end of the day. It improves with elevation of his legs. He denies orthopnea or PND. His last appointment he was started on spironolactone but this was discontinued due to hyperkalemia.  Theodore [name] is excited about a gender reveal party for his firs great grandchild tomorrow.   Past Medical History:  Diagnosis Date  . History of arthritis   . History of rheumatic fever   . Hypercholesterolemia   . Hypertension   . Nocturia   . PAF (paroxysmal atrial fibrillation) [facility]) January 2013   Placed on Pradaxa. Did not require cardioversion; spontaneously converted  . Peripheral edema   . Right bundle branch block   . SOB (shortness of breath)   . Tendonitis of elbow, left     Past Surgical History:  Procedure Laterality Date  . BACK SURGERY  12/24/09   fusion C3-C4  . BACK SURGERY  2010   fusion L4-L5  . CARDIAC CATHETERIZATION  2009   NONOBSTRUCTIVE ATHERSCLEROTIC CORONARY DISEASE AND NORMAL  LV FUNCTION  . CARDIAC CATHETERIZATION N/A 09/08/2014   Procedure: Left Heart Cath and Coronary Angiography;  Surgeon: Belva Crome, MD;  Location: [place] [facility];  Service: Cardiovascular;  Laterality: N/A;  . CARDIAC CATHETERIZATION N/A 09/28/2015   Procedure: Left Heart Cath and Coronary Angiography;  Surgeon: Leonie Man, MD;  Location: [place] [facility];  Service: Cardiovascular;  Laterality: N/A;  . CARDIAC  CATHETERIZATION N/A 09/28/2015   Procedure: Intravascular Pressure Wire/FFR Study;  Surgeon: Leonie Man, MD;  Location: [place] [facility];  Service: Cardiovascular;  Laterality: N/A;  . CARDIOVERSION N/A 10/23/2015   Procedure: CARDIOVERSION;  Surgeon: [name], MD;  Location: [facility] ENDOSCOPY;  Service: Cardiovascular;  Laterality: N/A;  . COLONOSCOPY  2013   per patient, rpt 5 yrs  . ELECTROPHYSIOLOGIC STUDY N/A 12/19/2015   Procedure: Atrial Fibrillation Ablation;  Surgeon: [name], MD;  Location: [place] [facility];  Service: Cardiovascular;  Laterality: N/A;  . KNEE SURGERY Right 2007  . PATELLAR TENDON REPAIR Left 2008  . TRICEPS TENDON REPAIR Left 2013     Current Outpatient Prescriptions  Medication Sig Dispense Refill  . Ascorbic Acid (VITAMIN C PO) Take 1 tablet by mouth daily.     [name] atorvastatin (LIPITOR) 40 MG tablet Take 1 tablet (40 mg total) by mouth daily. 90 tablet 3  . Cholecalciferol (VITAMIN D3) 2000 units TABS Take 2,000 Units by mouth daily.    [name] diltiazem (CARTIA XT) 180 MG 24 hr capsule Take 1 capsule (180 mg total) by mouth daily. 90 capsule 3  . ezetimibe (ZETIA) 10 MG tablet Take 1 tablet (10 mg total) by mouth daily. 90 tablet 3  . furosemide (LASIX) 20 MG tablet Take 1 tablet (20 mg total) by mouth daily. 90 tablet 3  . losartan (COZAAR) 100 MG tablet Take 1 tablet (100 mg total) by mouth daily. 90 tablet 3  . LYSINE PO Take 1 tablet by mouth daily.     . metoprolol succinate (TOPROL-XL) 100 MG 24 hr tablet TAKE 1 TABLET (100 MG TOTAL) BY MOUTH DAILY. TAKE WITH OR IMMEDIATELY FOLLOWING A MEAL. 30 tablet 0  . metoprolol succinate (TOPROL-XL) 50 MG 24 hr tablet TAKE 1 TABLET (50 MG TOTAL) BY MOUTH DAILY. TAKE WITH OR IMMEDIATELY FOLLOWING A MEAL. 30 tablet 0  . naproxen sodium (ANAPROX) 220 MG tablet Take 220 mg by mouth daily as needed (for aches/pain.).     [name] omeprazole (PRILOSEC) 20 MG capsule Take 20 mg by mouth daily.      . potassium  chloride (K-DUR,KLOR-CON) 10 MEQ tablet Take 10 mEq by mouth 2 (two) times daily.    . rivaroxaban (XARELTO) 20 MG TABS tablet Take 1 tablet (20 mg total) by mouth daily with supper. 90 tablet 1  . Tiotropium Bromide Monohydrate (SPIRIVA RESPIMAT) 1.25 MCG/ACT AERS Inhale 2 puffs into the lungs daily. 3 Inhaler 3   No current facility-administered medications for this visit.     Allergies:   Penicillins; Amlodipine; Lisinopril; Zocor [simvastatin]; and Tamiflu    Social History:  The patient  reports that he has never smoked. He has never used smokeless tobacco. He reports that he drinks about 1.2 oz of alcohol per week . He reports that he does  not use drugs.   Family History:  The patient's family history includes Asthma in his sister; COPD in his brother and father; Heart attack in his father; Heart disease in his brother; Hypertension in his father.    ROS:  Please see the history of present illness.   Otherwise, review of systems are positive for none.   All other systems are reviewed and negative.    PHYSICAL EXAM: VS:  BP 137/81   Pulse 64   Ht 6' (1.829 m)   Wt 101.1 kg (222 lb 12.8 oz)   BMI 30.22 kg/m  , BMI Body mass index is 30.22 kg/m. GENERAL:  Well appearing HEENT:  Pupils equal round and reactive, fundi not visualized, oral mucosa unremarkable NECK:  No jugular venous distention, waveform within normal limits, carotid upstroke brisk and symmetric, no bruit LYMPHATICS:  No cervical adenopathy LUNGS:  Clear to auscultation bilaterally HEART:  RRR. PMI not displaced or sustained,S1 and S2 within normal limits, no S3, no S4, no clicks, no rubs, no  murmurs ABD:  Flat, positive bowel sounds normal in frequency in pitch, no bruits, no rebound, no guarding, no midline pulsatile mass, no hepatomegaly, no splenomegaly EXT:  2 plus pulses throughout, no edema, no cyanosis no clubbing SKIN:  No rashes no nodules NEURO:  Cranial nerves II through XII grossly intact, motor  grossly intact throughout PSYCH:  Cognitively intact, oriented to person place and time   EKG:  EKG is ordered today. 10/30/15: Sinus rhythm.  Rate 60 bpm.  RBBB.  LAD.  The ekg ordered 09/25/15 demonstrates atrial fibrillation.  Rate 84 bpm.   Lexiscan Myoview 10/17/15:  The left ventricular ejection fraction is moderately decreased (30-44%).  Nuclear stress EF: 39%.  There was no ST segment deviation noted during stress.  There is a small defect of moderate severity present in the apical lateral and apex location and medium sized defect of moderate severity in the basal inferior, mild inferior and apical inferior location. These defects are fixed and consistent with scar. No ischemia noted.  This is an intermediate risk study due to LV dysfunction.   LHC 09/28/15:  Ostial to proximal LAD stenoses of this at least moderately calcified. 70% focal stenosis followed by diffuse 50% stenosis.  FFR evaluation of the combination of the LAD lesions showed no significant drop in baseline FFR from 0.96 down to 0.95. This suggests physiologically not significant lesion.  There is moderate left ventricular systolic dysfunction. The left ventricular ejection fraction is 35-45% by visual estimate.  LV end diastolic pressure is moderately elevated.  LHC 09/08/14:   1. 1st Mrg lesion, 30% stenosed. 2. Prox LAD lesion, 50% stenosed. 3. Ost LAD lesion, 70% stenosed. 4. Prox RCA to Dist RCA lesion, 35% stenosed.   50-70% ostial/proximal LAD stenosis within the heavily calcified segment. Recent myocardial perfusion energy and within the past week did not demonstrate anterior ischemia.  Heavily calcified but widely patent circumflex and right coronary with luminal irregularities noted in the proximal mid and distal segments.  Mild global left ventricular dysfunction with an ejection fraction in the 45-50% range   Recommendations:   The patient's symptoms are mixed. With exertion he  experiences dyspnea. At rest and randomly he has neck jaw and left elbow discomfort. When both the angiogram and the myocardial perfusion study were considered together, no justification for percutaneous intervention on the LAD could be made.  Close clinical follow-up  Aggressive risk factor modification    Recent Labs: 08/07/2015: Pro  B Natriuretic peptide (BNP) 232.0; TSH 1.64 11/07/2015: ALT 25 12/05/2015: BUN 15; Creat 1.01; Hemoglobin 16.0; Platelets 143; Potassium 4.3; Sodium 134    Lipid Panel    Component Value Date/Time   CHOL 126 10/30/2015 0855   TRIG 67 10/30/2015 0855   HDL 54 10/30/2015 0855   CHOLHDL 2.3 10/30/2015 0855   VLDL 13 10/30/2015 0855   LDLCALC 59 10/30/2015 0855      Wt Readings from Last 3 Encounters:  01/30/16 101.1 kg (222 lb 12.8 oz)  01/17/16 101.2 kg (223 lb)  12/19/15 99.8 kg (220 lb)      ASSESSMENT AND PLAN:  # Paroxysmal atrial fibrillation: Theodore [name] remains in atrial fibrillation. He underwent ablation with Dr. [name] and is doing well.  Continue  Metoprolol, diltiazem, and Xarelto.  This patients CHA2DS2-VASc Score and unadjusted Ischemic Stroke Rate (% per year) is equal to 7.2 % stroke rate/year from a score of 5  Above score calculated as 1 point each if present [CHF, HTN, DM, Vascular=MI/PAD/Aortic Plaque, Age if 65-74, or Male] Above score calculated as 2 points each if present [Age > 75, or Stroke/TIA/TE]   # CAD: Theodore [name] has a known, 70% ostial LAD lesion.  His stress test did not reveal ischemia in this distribution and the FFR did not reveal a hemodynamically significant lesion.  Continue aggressive medical management.  He is not on aspirin or P2Y12 ihnibitor because he is on Xarelto. Continue atorvastatin, Zetia and metoprolol.  # Chronic systolic and diastolic heart failure: LVEF on LHC was 35-45% and 39% on Lexiscan Myoview.  He is euvolemic on exam.  He did not tolerate spironolactone due to hyperkalemia. Continue  furosemide, losartan, and metoprolol succinate.  # Hypertension: Blood pressure is well-controlled.  Continue metoprolol, losartan, and diltiazem.    # Hyperlipidemia: LDL 59 on 10/2015.  Continue atorvastatin and Zetia.    Current medicines are reviewed at length with the patient today.  The patient does not have concerns regarding medicines.  The following changes have been made:  None  Labs/ tests ordered today include:   No orders of the defined types were placed in this encounter.    Disposition:   FU with Yovany Clock C. [name], MD, [facility] in 6 months.    This note was written with the assistance of speech recognition software.  Please excuse any transcriptional errors.  Signed, Doloras Tellado C. [name], MD, [facility]  01/30/2016 9:16 AM    [place]

## 2016-01-30 ENCOUNTER — Encounter: Payer: Self-pay | Admitting: Cardiovascular Disease

## 2016-01-30 ENCOUNTER — Ambulatory Visit (INDEPENDENT_AMBULATORY_CARE_PROVIDER_SITE_OTHER): Payer: Medicare Other | Admitting: Cardiovascular Disease

## 2016-01-30 ENCOUNTER — Other Ambulatory Visit: Payer: Self-pay | Admitting: Family Medicine

## 2016-01-30 VITALS — BP 137/81 | HR 64 | Ht 72.0 in | Wt 222.8 lb

## 2016-01-30 DIAGNOSIS — E7800 Pure hypercholesterolemia, unspecified: Secondary | ICD-10-CM | POA: Diagnosis not present

## 2016-01-30 DIAGNOSIS — R0789 Other chest pain: Secondary | ICD-10-CM

## 2016-01-30 DIAGNOSIS — I110 Hypertensive heart disease with heart failure: Secondary | ICD-10-CM

## 2016-01-30 DIAGNOSIS — I480 Paroxysmal atrial fibrillation: Secondary | ICD-10-CM | POA: Diagnosis not present

## 2016-01-30 DIAGNOSIS — I2510 Atherosclerotic heart disease of native coronary artery without angina pectoris: Secondary | ICD-10-CM

## 2016-01-30 NOTE — Patient Instructions (Addendum)
Your physician wants you to follow-up in: 6 months  You will receive a reminder letter in the mail two months in advance. If you don't receive a letter, please call our office to schedule the follow-up appointment.  Your physician recommends that you continue on your current medications as directed. Please refer to the Current Medication list given to you today.  

## 2016-01-31 ENCOUNTER — Other Ambulatory Visit (INDEPENDENT_AMBULATORY_CARE_PROVIDER_SITE_OTHER): Payer: Medicare Other

## 2016-01-31 DIAGNOSIS — Z79899 Other long term (current) drug therapy: Secondary | ICD-10-CM

## 2016-01-31 DIAGNOSIS — I4891 Unspecified atrial fibrillation: Secondary | ICD-10-CM

## 2016-01-31 LAB — COMPREHENSIVE METABOLIC PANEL
ALK PHOS: 67 U/L (ref 39–117)
ALT: 22 U/L (ref 0–53)
AST: 22 U/L (ref 0–37)
Albumin: 4.4 g/dL (ref 3.5–5.2)
BILIRUBIN TOTAL: 0.8 mg/dL (ref 0.2–1.2)
BUN: 15 mg/dL (ref 6–23)
CO2: 29 meq/L (ref 19–32)
CREATININE: 0.99 mg/dL (ref 0.40–1.50)
Calcium: 9.3 mg/dL (ref 8.4–10.5)
Chloride: 98 mEq/L (ref 96–112)
GFR: 77.43 mL/min (ref >60.00)
GLUCOSE: 102 mg/dL — AB (ref 70–99)
Potassium: 4.5 mEq/L (ref 3.5–5.1)
SODIUM: 135 meq/L (ref 135–145)
TOTAL PROTEIN: 6.8 g/dL (ref 6.0–8.3)

## 2016-01-31 LAB — LIPID PANEL
CHOL/HDL RATIO: 2
Cholesterol: 126 mg/dL (ref 0–200)
HDL: 51.9 mg/dL (ref >39.00)
LDL Cholesterol: 62 mg/dL (ref 0–99)
NONHDL: 73.95
Triglycerides: 62 mg/dL (ref 0.0–149.0)
VLDL: 12.4 mg/dL (ref 0.0–40.0)

## 2016-02-01 ENCOUNTER — Other Ambulatory Visit: Payer: Medicare Other

## 2016-02-06 ENCOUNTER — Ambulatory Visit (INDEPENDENT_AMBULATORY_CARE_PROVIDER_SITE_OTHER): Payer: Medicare Other | Admitting: Family Medicine

## 2016-02-06 ENCOUNTER — Encounter: Payer: Self-pay | Admitting: Family Medicine

## 2016-02-06 VITALS — BP 130/78 | HR 64 | Temp 97.9°F | Wt 224.8 lb

## 2016-02-06 DIAGNOSIS — K219 Gastro-esophageal reflux disease without esophagitis: Secondary | ICD-10-CM

## 2016-02-06 DIAGNOSIS — I480 Paroxysmal atrial fibrillation: Secondary | ICD-10-CM

## 2016-02-06 DIAGNOSIS — Z7189 Other specified counseling: Secondary | ICD-10-CM | POA: Diagnosis not present

## 2016-02-06 DIAGNOSIS — Z0001 Encounter for general adult medical examination with abnormal findings: Secondary | ICD-10-CM | POA: Insufficient documentation

## 2016-02-06 DIAGNOSIS — M170 Bilateral primary osteoarthritis of knee: Secondary | ICD-10-CM

## 2016-02-06 DIAGNOSIS — I10 Essential (primary) hypertension: Secondary | ICD-10-CM

## 2016-02-06 DIAGNOSIS — E7800 Pure hypercholesterolemia, unspecified: Secondary | ICD-10-CM

## 2016-02-06 DIAGNOSIS — Z0000 Encounter for general adult medical examination without abnormal findings: Secondary | ICD-10-CM | POA: Insufficient documentation

## 2016-02-06 DIAGNOSIS — I25118 Atherosclerotic heart disease of native coronary artery with other forms of angina pectoris: Secondary | ICD-10-CM

## 2016-02-06 NOTE — Patient Instructions (Addendum)
Bring [place] copy of your living will to update your chart. You are doing well today. Return in 6 months for medicare wellness visit and in 1 year for physical.   Health Maintenance, Male A healthy lifestyle and preventative care can promote health and wellness.  Maintain regular health, dental, and eye exams.  Eat a healthy diet. Foods like vegetables, fruits, whole grains, low-fat dairy products, and lean protein foods contain the nutrients you need and are low in calories. Decrease your intake of foods high in solid fats, added sugars, and salt. Get information about a proper diet from your health care provider, if necessary.  Regular physical exercise is one of the most important things you can do for your health. Most adults should get at least 150 minutes of moderate-intensity exercise (any activity that increases your heart rate and causes you to sweat) each week. In addition, most adults need muscle-strengthening exercises on 2 or more days a week.   Maintain a healthy weight. The body mass index (BMI) is a screening tool to identify possible weight problems. It provides an estimate of body fat based on height and weight. Your health care provider can find your BMI and can help you achieve or maintain a healthy weight. For males 20 years and older:  A BMI below 18.5 is considered underweight.  A BMI of 18.5 to 24.9 is normal.  A BMI of 25 to 29.9 is considered overweight.  A BMI of 30 and above is considered obese.  Maintain normal blood lipids and cholesterol by exercising and minimizing your intake of saturated fat. Eat a balanced diet with plenty of fruits and vegetables. Blood tests for lipids and cholesterol should begin at age 66 and be repeated every 5 years. If your lipid or cholesterol levels are high, you are over age 21, or you are at high risk for heart disease, you may need your cholesterol levels checked more frequently.Ongoing high lipid and cholesterol levels should be  treated with medicines if diet and exercise are not working.  If you smoke, find out from your health care provider how to quit. If you do not use tobacco, do not start.  Lung cancer screening is recommended for adults aged 72-80 years who are at high risk for developing lung cancer because of a history of smoking. A yearly low-dose CT scan of the lungs is recommended for people who have at least a 30-pack-year history of smoking and are current smokers or have quit within the past 15 years. A pack year of smoking is smoking an average of 1 pack of cigarettes a day for 1 year (for example, a 30-pack-year history of smoking could mean smoking 1 pack a day for 30 years or 2 packs a day for 15 years). Yearly screening should continue until the smoker has stopped smoking for at least 15 years. Yearly screening should be stopped for people who develop a health problem that would prevent them from having lung cancer treatment.  If you choose to drink alcohol, do not have more than 2 drinks per day. One drink is considered to be 12 oz (360 mL) of beer, 5 oz (150 mL) of wine, or 1.5 oz (45 mL) of liquor.  Avoid the use of street drugs. Do not share needles with anyone. Ask for help if you need support or instructions about stopping the use of drugs.  High blood pressure causes heart disease and increases the risk of stroke. High blood pressure is more likely to  develop in:  People who have blood pressure in the end of the normal range (100-139/85-89 mm Hg).  People who are overweight or obese.  People who are African American.  If you are 59-51 years of age, have your blood pressure checked every 3-5 years. If you are 29 years of age or older, have your blood pressure checked every year. You should have your blood pressure measured twice-once when you are at a hospital or clinic, and once when you are not at a hospital or clinic. Record the average of the two measurements. To check your blood pressure when  you are not at a hospital or clinic, you can use:  An automated blood pressure machine at a pharmacy.  A home blood pressure monitor.  If you are 80-40 years old, ask your health care provider if you should take aspirin to prevent heart disease.  Diabetes screening involves taking a blood sample to check your fasting blood sugar level. This should be done once every 3 years after age 41 if you are at a normal weight and without risk factors for diabetes. Testing should be considered at a younger age or be carried out more frequently if you are overweight and have at least 1 risk factor for diabetes.  Colorectal cancer can be detected and often prevented. Most routine colorectal cancer screening begins at the age of 65 and continues through age 52. However, your health care provider may recommend screening at an earlier age if you have risk factors for colon cancer. On a yearly basis, your health care provider may provide home test kits to check for hidden blood in the stool. A small camera at the end of a tube may be used to directly examine the colon (sigmoidoscopy or colonoscopy) to detect the earliest forms of colorectal cancer. Talk to your health care provider about this at age 34 when routine screening begins. A direct exam of the colon should be repeated every 5-10 years through age 42, unless early forms of precancerous polyps or small growths are found.  People who are at an increased risk for hepatitis B should be screened for this virus. You are considered at high risk for hepatitis B if:  You were born in a country where hepatitis B occurs often. Talk with your health care provider about which countries are considered high risk.  Your parents were born in a high-risk country and you have not received a shot to protect against hepatitis B (hepatitis B vaccine).  You have HIV or AIDS.  You use needles to inject street drugs.  You live with, or have sex with, someone who has hepatitis  B.  You are a man who has sex with other men (MSM).  You get hemodialysis treatment.  You take certain medicines for conditions like cancer, organ transplantation, and autoimmune conditions.  Hepatitis C blood testing is recommended for all people born from [birth year] through 1963 and any individual with known risk factors for hepatitis C.  Healthy men should no longer receive prostate-specific antigen (PSA) blood tests as part of routine cancer screening. Talk to your health care provider about prostate cancer screening.  Testicular cancer screening is not recommended for adolescents or adult males who have no symptoms. Screening includes self-exam, a health care provider exam, and other screening tests. Consult with your health care provider about any symptoms you have or any concerns you have about testicular cancer.  Practice safe sex. Use condoms and avoid high-risk sexual practices to reduce  the spread of sexually transmitted infections (STIs).  You should be screened for STIs, including gonorrhea and chlamydia if:  You are sexually active and are younger than 24 years.  You are older than 24 years, and your health care provider tells you that you are at risk for this type of infection.  Your sexual activity has changed since you were last screened, and you are at an increased risk for chlamydia or gonorrhea. Ask your health care provider if you are at risk.  If you are at risk of being infected with HIV, it is recommended that you take a prescription medicine daily to prevent HIV infection. This is called pre-exposure prophylaxis (PrEP). You are considered at risk if:  You are a man who has sex with other men (MSM).  You are a heterosexual man who is sexually active with multiple partners.  You take drugs by injection.  You are sexually active with a partner who has HIV.  Talk with your health care provider about whether you are at high risk of being infected with HIV. If you  choose to begin PrEP, you should first be tested for HIV. You should then be tested every 3 months for as long as you are taking PrEP.  Use sunscreen. Apply sunscreen liberally and repeatedly throughout the day. You should seek shade when your shadow is shorter than you. Protect yourself by wearing long sleeves, pants, a wide-brimmed hat, and sunglasses year round whenever you are outdoors.  Tell your health care provider of new moles or changes in moles, especially if there is a change in shape or color. Also, tell your health care provider if a mole is larger than the size of a pencil eraser.  A one-time screening for abdominal aortic aneurysm (AAA) and surgical repair of large AAAs by ultrasound is recommended for men aged 1-75 years who are current or former smokers.  Stay current with your vaccines (immunizations). This information is not intended to replace advice given to you by your health care provider. Make sure you discuss any questions you have with your health care provider. Document Released: 07/26/2007 Document Revised: 02/17/2014 Document Reviewed: 10/31/2014 Elsevier Interactive Patient Education  2017 [employer].

## 2016-02-06 NOTE — Assessment & Plan Note (Signed)
Preventative protocols reviewed and updated unless pt declined. Discussed healthy diet and lifestyle.  

## 2016-02-06 NOTE — Assessment & Plan Note (Signed)
Chronic, stable. Continue current regimen. 

## 2016-02-06 NOTE — Progress Notes (Signed)
BP 130/78   Pulse 64   Temp 97.9 F (36.6 C) (Oral)   Wt 224 lb 12 oz (101.9 kg)   BMI 30.48 kg/m    CC: CPE Subjective:    Patient ID: Theodore [name], male    DOB: [birth year], 81 y.o.   MRN: [id number]  HPI: Theodore [name] is a 81 y.o. male presenting on 02/06/2016 for Annual Exam   Saw Katha Cabal 07/2015 for medicare wellness visit.   Significant improvement in exertional dyspnea and fatigue since cardioversion then ablation for atrial fibrillation.   L knee pain - to see orthopedist. S/p R knee replacement.   Preventative: COLONOSCOPY 2013 per patient, rpt 5 yrs [name]) Prostate cancer screening - always normal saw urology. Age out Lung cancer screening - non smoker Flu shot yearly  Tetanus shot unsure  Pneumovax 2011, prevnar 2016  Shingles shot - ~2014 Advanced directive discussion - has living will at home. HCPOA is son Sherren Mocha Psychiatric nurse in [place] Asked to bring [place] copy. Seat belt use discussed Sunscreen use and skin screen discussed. Saw Dr Allyson Sabal derm.  Non smoker Alcohol - 1-2 beers/day  Lives with wife, no pets Retired Occ: Art gallery manager Edu: master's Activity: golf and walking  Diet: good water, fruits/vegetables daily   Relevant past medical, surgical, family and social history reviewed and updated as indicated. Interim medical history since our last visit reviewed. Allergies and medications reviewed and updated. Current Outpatient Prescriptions on File Prior to Visit  Medication Sig  . Ascorbic Acid (VITAMIN C PO) Take 1 tablet by mouth daily.   [name] atorvastatin (LIPITOR) 40 MG tablet Take 1 tablet (40 mg total) by mouth daily.  . Cholecalciferol (VITAMIN D3) 2000 units TABS Take 2,000 Units by mouth daily.  [name] diltiazem (CARTIA XT) 180 MG 24 hr capsule Take 1 capsule (180 mg total) by mouth daily.  [name] ezetimibe (ZETIA) 10 MG tablet Take 1 tablet (10 mg total) by mouth daily.  . furosemide (LASIX) 20 MG tablet Take 1 tablet (20 mg total) by mouth  daily.  [name] losartan (COZAAR) 100 MG tablet Take 1 tablet (100 mg total) by mouth daily.  [name] LYSINE PO Take 1 tablet by mouth daily.   . metoprolol succinate (TOPROL-XL) 100 MG 24 hr tablet TAKE 1 TABLET (100 MG TOTAL) BY MOUTH DAILY. TAKE WITH OR IMMEDIATELY FOLLOWING A MEAL.  . metoprolol succinate (TOPROL-XL) 50 MG 24 hr tablet TAKE 1 TABLET (50 MG TOTAL) BY MOUTH DAILY. TAKE WITH OR IMMEDIATELY FOLLOWING A MEAL.  . naproxen sodium (ANAPROX) 220 MG tablet Take 220 mg by mouth daily as needed (for aches/pain.).   [name] omeprazole (PRILOSEC) 20 MG capsule Take 20 mg by mouth daily.    . potassium chloride (K-DUR,KLOR-CON) 10 MEQ tablet Take 10 mEq by mouth 2 (two) times daily.  . rivaroxaban (XARELTO) 20 MG TABS tablet Take 1 tablet (20 mg total) by mouth daily with supper.  . Tiotropium Bromide Monohydrate (SPIRIVA RESPIMAT) 1.25 MCG/ACT AERS Inhale 2 puffs into the lungs daily.   No current facility-administered medications on file prior to visit.     Review of Systems  Constitutional: Negative for activity change, appetite change, chills, fatigue, fever and unexpected weight change.  HENT: Negative for hearing loss.   Eyes: Negative for visual disturbance.  Respiratory: Negative for cough, chest tightness, shortness of breath and wheezing.   Cardiovascular: Negative for chest pain, palpitations and leg swelling.  Gastrointestinal: Negative for abdominal distention, abdominal pain, blood in stool,  constipation, diarrhea, nausea and vomiting.  Genitourinary: Negative for difficulty urinating and hematuria.  Musculoskeletal: Negative for arthralgias, myalgias and neck pain.  Skin: Negative for rash.  Neurological: Negative for dizziness, seizures, syncope and headaches.  Hematological: Negative for adenopathy. Does not bruise/bleed easily (on xarelto).  Psychiatric/Behavioral: Negative for dysphoric mood. The patient is not nervous/anxious.    Per HPI unless specifically indicated in ROS  section     Objective:    BP 130/78   Pulse 64   Temp 97.9 F (36.6 C) (Oral)   Wt 224 lb 12 oz (101.9 kg)   BMI 30.48 kg/m   Wt Readings from Last 3 Encounters:  02/06/16 224 lb 12 oz (101.9 kg)  01/30/16 222 lb 12.8 oz (101.1 kg)  01/17/16 223 lb (101.2 kg)    Physical Exam  Constitutional: He is oriented to person, place, and time. He appears well-developed and well-nourished. No distress.  HENT:  Head: Normocephalic and atraumatic.  Right Ear: Hearing, tympanic membrane, external ear and ear canal normal.  Left Ear: Hearing, tympanic membrane, external ear and ear canal normal.  Nose: Nose normal.  Mouth/Throat: Uvula is midline, oropharynx is clear and moist and mucous membranes are normal. No oropharyngeal exudate, posterior oropharyngeal edema or posterior oropharyngeal erythema.  Eyes: Conjunctivae and EOM are normal. Pupils are equal, round, and reactive to light. No scleral icterus.  Neck: Normal range of motion. Neck supple. Carotid bruit is not present. No thyromegaly present.  Cardiovascular: Normal rate, regular rhythm, normal heart sounds and intact distal pulses.   No murmur heard. Pulses:      Radial pulses are 2+ on the right side, and 2+ on the left side.  Pulmonary/Chest: Effort normal and breath sounds normal. No respiratory distress. He has no wheezes. He has no rales.  Abdominal: Soft. Bowel sounds are normal. He exhibits no distension and no mass. There is no tenderness. There is no rebound and no guarding.  Musculoskeletal: Normal range of motion. He exhibits no edema.  Lymphadenopathy:    He has no cervical adenopathy.  Neurological: He is alert and oriented to person, place, and time.  CN grossly intact, station and gait intact  Skin: Skin is warm and dry. No rash noted.  Psychiatric: He has a normal mood and affect. His behavior is normal. Judgment and thought content normal.  Nursing note and vitals reviewed.  Results for orders placed or  performed in visit on 01/31/16  Lipid panel  Result Value Ref Range   Cholesterol 126 0 - 200 mg/dL   Triglycerides 62.0 0.0 - 149.0 mg/dL   HDL 51.90 >39.00 mg/dL   VLDL 12.4 0.0 - 40.0 mg/dL   LDL Cholesterol 62 0 - 99 mg/dL   Total CHOL/HDL Ratio 2    NonHDL 73.95   Comprehensive metabolic panel  Result Value Ref Range   Sodium 135 135 - 145 mEq/L   Potassium 4.5 3.5 - 5.1 mEq/L   Chloride 98 96 - 112 mEq/L   CO2 29 19 - 32 mEq/L   Glucose, Bld 102 (H) 70 - 99 mg/dL   BUN 15 6 - 23 mg/dL   Creatinine, Ser 0.99 0.40 - 1.50 mg/dL   Total Bilirubin 0.8 0.2 - 1.2 mg/dL   Alkaline Phosphatase 67 39 - 117 U/L   AST 22 0 - 37 U/L   ALT 22 0 - 53 U/L   Total Protein 6.8 6.0 - 8.3 g/dL   Albumin 4.4 3.5 - 5.2 g/dL  Calcium 9.3 8.4 - 10.5 mg/dL   GFR 77.43 >60.00 mL/min      Assessment & Plan:   Problem List Items Addressed This Visit    Advanced care planning/counseling discussion    Advanced directive discussion - has living will at home. HCPOA is son Sherren Mocha Psychiatric nurse in [place] Asked to bring [place] copy.      CAD (coronary artery disease), native coronary artery (Chronic)    Followed by cards.      Essential hypertension (Chronic)    Chronic, stable. Continue current regimen.      GERD (gastroesophageal reflux disease)    Stable on low dose PPI. Heartburn if dose missed.      Health maintenance examination - Primary    Preventative protocols reviewed and updated unless pt declined. Discussed healthy diet and lifestyle.       HLD (hyperlipidemia)    Chronic, stable. Marked improvement with zetia. Continue current regimen.       Osteoarthritis    Upcoming appt with ortho to discuss L knee surgery.      Paroxysmal atrial fibrillation (HCC) (Chronic)    S/p cardioversion then ablation. Appreciate cards care of patient.          Follow up plan: Return in about 6 months (around 08/06/2016) for medicare wellness visit.  [name], MD

## 2016-02-06 NOTE — Assessment & Plan Note (Signed)
S/p cardioversion then ablation. Appreciate cards care of patient.

## 2016-02-06 NOTE — Assessment & Plan Note (Signed)
Upcoming appt with ortho to discuss L knee surgery.

## 2016-02-06 NOTE — Progress Notes (Signed)
Pre visit review using our clinic review tool, if applicable. No additional management support is needed unless otherwise documented below in the visit note. 

## 2016-02-06 NOTE — Assessment & Plan Note (Signed)
Stable on low dose PPI. Heartburn if dose missed.

## 2016-02-06 NOTE — Assessment & Plan Note (Signed)
Advanced directive discussion - has living will at home. HCPOA is son Sherren Mocha Psychiatric nurse in [place] Asked to bring [place] copy.

## 2016-02-06 NOTE — Assessment & Plan Note (Signed)
Followed by cards °

## 2016-02-06 NOTE — Assessment & Plan Note (Signed)
Chronic, stable. Marked improvement with zetia. Continue current regimen.

## 2016-02-15 ENCOUNTER — Telehealth: Payer: Self-pay | Admitting: *Deleted

## 2016-02-15 NOTE — Telephone Encounter (Signed)
Request for surgical clearance:  1. What type of surgery is being performed? Left knee scope and chondroplasty   2. When is this surgery scheduled? 03/13/2016   3. Are there any medications that need to be held prior to surgery and how long?Xarelto  4. Name of physician performing surgery? Dr [name] with [facility]    5. What is your office phone and fax number? Phone [phone] Fax [phone]  Will forward to Dr [name] for review

## 2016-02-23 NOTE — Telephone Encounter (Signed)
Theodore [name] is at acceptable risk.  OK to hold Xarelto for 3 days prior to surgery.

## 2016-02-29 NOTE — Telephone Encounter (Signed)
Sent via [place] 02/26/16 called and confirmed receiving today

## 2016-03-06 ENCOUNTER — Ambulatory Visit: Payer: Medicare Other | Admitting: Cardiology

## 2016-03-13 HISTORY — PX: KNEE ARTHROSCOPY: SUR90

## 2016-03-20 ENCOUNTER — Other Ambulatory Visit: Payer: Self-pay | Admitting: Cardiovascular Disease

## 2016-03-20 NOTE — Telephone Encounter (Signed)
Refill request

## 2016-03-24 ENCOUNTER — Ambulatory Visit (INDEPENDENT_AMBULATORY_CARE_PROVIDER_SITE_OTHER): Payer: Medicare Other | Admitting: Cardiology

## 2016-03-24 ENCOUNTER — Encounter: Payer: Self-pay | Admitting: Cardiology

## 2016-03-24 VITALS — BP 138/80 | HR 68 | Ht 72.0 in | Wt 220.6 lb

## 2016-03-24 DIAGNOSIS — I481 Persistent atrial fibrillation: Secondary | ICD-10-CM | POA: Diagnosis not present

## 2016-03-24 DIAGNOSIS — I4819 Other persistent atrial fibrillation: Secondary | ICD-10-CM

## 2016-03-24 NOTE — Progress Notes (Signed)
Electrophysiology Office Note   Date:  03/24/2016   ID:  Theodore [name], DOB February 26, 1934, MRN [id number]  PCP:  [name], MD  Cardiologist:  [name] Primary Electrophysiologist:  Jamorian Dimaria Meredith Leeds, MD    Chief Complaint  Patient presents with  . Follow-up    Persistent Afib     History of Present Illness: Theodore [name] is a 81 y.o. male who presents today for electrophysiology evaluation.   He has a history of hypertension, hyperlipidemia, persistent atrial fibrillation, asthma, and rheumatic fever. Theodore [name] was diagnosed with atrial fibrillation in 2014.  He was initially on Pradaxa and switched to Xarelto.  He had an episode of chest pain 08/2014 and had a Lexiscan Myoview 08/31/14 that showed an old inferior scar with peri-infarct ischemia and LVEF 49%.  He then had a LHC that revealed 70% ostial LAD, 50% prox LAD, 30% OM1 and 35% RCA.    AF ablation 12/19/15. He is been feeling well since his ablation. He says that he is having much less fatigue, and his lower extremity swelling has improved.  Today, he denies symptoms of palpitations, chest pain, shortness of breath, orthopnea, PND, lower extremity edema, claudication, dizziness, presyncope, syncope, bleeding, or neurologic sequela. The patient is tolerating medications without difficulties and is otherwise without complaint today.    Past Medical History:  Diagnosis Date  . History of arthritis   . History of rheumatic fever   . Hypercholesterolemia   . Hypertension   . Nocturia   . PAF (paroxysmal atrial fibrillation) [facility]) January 2013   Placed on Pradaxa. Did not require cardioversion; spontaneously converted  . Peripheral edema   . Right bundle branch block   . SOB (shortness of breath)   . Tendonitis of elbow, left    Past Surgical History:  Procedure Laterality Date  . BACK SURGERY  12/24/09   fusion C3-C4  . BACK SURGERY  2010   fusion L4-L5  . CARDIAC CATHETERIZATION  2009   NONOBSTRUCTIVE  ATHERSCLEROTIC CORONARY DISEASE AND NORMAL  LV FUNCTION  . CARDIAC CATHETERIZATION N/A 09/08/2014   Procedure: Left Heart Cath and Coronary Angiography;  Surgeon: Belva Crome, MD;  Location: [place] [facility];  Service: Cardiovascular;  Laterality: N/A;  . CARDIAC CATHETERIZATION N/A 09/28/2015   Procedure: Left Heart Cath and Coronary Angiography;  Surgeon: Leonie Man, MD;  Location: [place] [facility];  Service: Cardiovascular;  Laterality: N/A;  . CARDIAC CATHETERIZATION N/A 09/28/2015   Procedure: Intravascular Pressure Wire/FFR Study;  Surgeon: Leonie Man, MD;  Location: [place] [facility];  Service: Cardiovascular;  Laterality: N/A;  . CARDIOVERSION N/A 10/23/2015   Procedure: CARDIOVERSION;  Surgeon: [name], MD;  Location: [facility] ENDOSCOPY;  Service: Cardiovascular;  Laterality: N/A;  . COLONOSCOPY  2013   per patient, rpt 5 yrs  . ELECTROPHYSIOLOGIC STUDY N/A 12/19/2015   Procedure: Atrial Fibrillation Ablation;  Surgeon: Flo Berroa Meredith Leeds, MD;  Location: [place] [facility];  Service: Cardiovascular;  Laterality: N/A;  . KNEE SURGERY Right 2007  . PATELLAR TENDON REPAIR Left 2008  . TRICEPS TENDON REPAIR Left 2013     Current Outpatient Prescriptions  Medication Sig Dispense Refill  . Ascorbic Acid (VITAMIN C PO) Take 1 tablet by mouth daily.     [name] atorvastatin (LIPITOR) 40 MG tablet Take 1 tablet (40 mg total) by mouth daily. 90 tablet 3  . Cholecalciferol (VITAMIN D3) 2000 units TABS Take 2,000 Units by mouth daily.    [name]  diltiazem (CARTIA XT) 180 MG 24 hr capsule Take 1 capsule (180 mg total) by mouth daily. 90 capsule 3  . ezetimibe (ZETIA) 10 MG tablet Take 1 tablet (10 mg total) by mouth daily. 90 tablet 3  . losartan (COZAAR) 100 MG tablet Take 1 tablet (100 mg total) by mouth daily. 90 tablet 3  . LYSINE PO Take 1 tablet by mouth daily.     . metoprolol succinate (TOPROL-XL) 100 MG 24 hr tablet TAKE 1 TABLET (100 MG TOTAL) BY MOUTH DAILY. TAKE WITH OR  IMMEDIATELY FOLLOWING A MEAL. 30 tablet 0  . metoprolol succinate (TOPROL-XL) 50 MG 24 hr tablet TAKE 1 TABLET (50 MG TOTAL) BY MOUTH DAILY. TAKE WITH OR IMMEDIATELY FOLLOWING A MEAL. 30 tablet 0  . naproxen sodium (ANAPROX) 220 MG tablet Take 220 mg by mouth daily as needed (for aches/pain.).     [name] omeprazole (PRILOSEC) 20 MG capsule Take 20 mg by mouth daily.      . potassium chloride (K-DUR,KLOR-CON) 10 MEQ tablet Take 10 mEq by mouth 2 (two) times daily.    . Tiotropium Bromide Monohydrate (SPIRIVA RESPIMAT) 1.25 MCG/ACT AERS Inhale 2 puffs into the lungs daily. 3 Inhaler 3  . XARELTO 20 MG TABS tablet TAKE 1 TABLET DAILY WITH SUPPER 90 tablet 1  . furosemide (LASIX) 20 MG tablet Take 1 tablet (20 mg total) by mouth daily. 90 tablet 3   No current facility-administered medications for this visit.     Allergies:   Penicillins; Amlodipine; Lisinopril; Zocor [simvastatin]; and Tamiflu   Social History:  The patient  reports that he has never smoked. He has never used smokeless tobacco. He reports that he drinks about 1.2 oz of alcohol per week . He reports that he does not use drugs.   Family History:  The patient's family history includes Asthma in his sister; COPD in his brother and father; Heart attack in his father; Heart disease in his brother; Hypertension in his father.    ROS:  Please see the history of present illness.   Otherwise, review of systems is positive for none.   All other systems are reviewed and negative.    PHYSICAL EXAM: VS:  BP 138/80   Pulse 68   Ht 6' (1.829 m)   Wt 220 lb 9.6 oz (100.1 kg)   BMI 29.92 kg/m  , BMI Body mass index is 29.92 kg/m. GEN: Well nourished, well developed, in no acute distress  HEENT: normal  Neck: no JVD, carotid bruits, or masses Cardiac: RRR; no murmurs, rubs, or gallops,no edema  Respiratory:  clear to auscultation bilaterally, normal work of breathing GI: soft, nontender, nondistended, + BS MS: no deformity or atrophy    Skin: warm and dry, Neuro:  Strength and sensation are intact Psych: euthymic mood, full affect  EKG:  EKG is ordered today. Personal review of the ekg ordered shows sinus rhythm, RBBB, LAFB  Recent Labs: 08/07/2015: Pro B Natriuretic peptide (BNP) 232.0; TSH 1.64 12/05/2015: Hemoglobin 16.0; Platelets 143 01/31/2016: ALT 22; BUN 15; Creatinine, Ser 0.99; Potassium 4.5; Sodium 135    Lipid Panel     Component Value Date/Time   CHOL 126 01/31/2016 0850   TRIG 62.0 01/31/2016 0850   HDL 51.90 01/31/2016 0850   CHOLHDL 2 01/31/2016 0850   VLDL 12.4 01/31/2016 0850   LDLCALC 62 01/31/2016 0850     Wt Readings from Last 3 Encounters:  03/24/16 220 lb 9.6 oz (100.1 kg)  02/06/16 224 lb 12  oz (101.9 kg)  01/30/16 222 lb 12.8 oz (101.1 kg)      Other studies Reviewed: Additional studies/ records that were reviewed today include: SPECT 10/17/15, TTE 10/09/14  Review of the above records today demonstrates:   The left ventricular ejection fraction is moderately decreased (30-44%).  Nuclear stress EF: 39%.  There was no ST segment deviation noted during stress.  There is a small defect of moderate severity present in the apical lateral and apex location and medium sized defect of moderate severity in the basal inferior, mild inferior and apical inferior location. These defects are fixed and consistent with scar. No ischemia noted.  This is an intermediate risk study due to LV dysfunction.   - Left ventricle: The cavity size was normal. Wall thickness was   normal. Systolic function was normal. The estimated ejection   fraction was in the range of 55% to 60%. Wall motion was normal;   there were no regional wall motion abnormalities. Doppler   parameters are consistent with abnormal left ventricular   relaxation (grade 1 diastolic dysfunction). - Aortic valve: There was mild regurgitation. - Mitral valve: There was mild regurgitation. - Left atrium: The atrium was mildly  dilated. - Right ventricle: The cavity size was mildly dilated. - Right atrium: The atrium was mildly dilated. - Pulmonary arteries: Systolic pressure was mildly increased. PA   peak pressure: 36 mm Hg (S).  Impressions:  - Normal LV systolic function; grade 1 diastolic dysfunction; mild   biatrial enlargement; mild RVE; calcified aortic valve with mild   AI; mild MR; trace TR with mildly elevated pulmonary pressure.   ASSESSMENT AND PLAN:  1.  Persistent atrial fibrillation: Currently on Xarelto, diltiazem, and metoprolol for rate control. He had symptoms of fatigue and shortness of breath when he was in atrial fibrillation, and therefore a rhythm control strategy. AF ablation 12/19/15.  Remains in sinus rhythm. No changes were made to his medications.  This patients CHA2DS2-VASc Score and unadjusted Ischemic Stroke Rate (% per year) is equal to 3.2 % stroke rate/year from a score of 3  Above score calculated as 1 point each if present [CHF, HTN, DM, Vascular=MI/PAD/Aortic Plaque, Age if 65-74, or Male] Above score calculated as 2 points each if present [Age > 75, or Stroke/TIA/TE]   2. CAD: Currently no chest pain  3. Chronic systolic heart failure: On Aldactone, metoprolol, and losartan  4. Hypertension: Well-controlled today   5. Hyperlipidemia: Continue atorvastatin    Current medicines are reviewed at length with the patient today.   The patient does not have concerns regarding his medicines.  The following changes were made today:  none  Labs/ tests ordered today include:  No orders of the defined types were placed in this encounter.    Disposition:   FU with Norman Bier 6 weeks  Signed, Miyeko Mahlum Meredith Leeds, MD  03/24/2016 9:41 AM     [facility] [address] [place] [address] [phone] (office) [phone] (fax)

## 2016-03-24 NOTE — Patient Instructions (Signed)
Medication Instructions:    Your physician recommends that you continue on your current medications as directed. Please refer to the Current Medication list given to you today.  - If you need a refill on your cardiac medications before your next appointment, please call your pharmacy.   Labwork:  None ordered  Testing/Procedures:  None ordered  Follow-Up:  Your physician recommends that you schedule a follow-up appointment in: 3 months with Dr. Camnitz.  Thank you for choosing [facility]!!   Rashon Westrup, RN [phone]         

## 2016-05-12 ENCOUNTER — Encounter: Payer: Self-pay | Admitting: Internal Medicine

## 2016-05-12 ENCOUNTER — Ambulatory Visit (INDEPENDENT_AMBULATORY_CARE_PROVIDER_SITE_OTHER): Payer: Medicare Other | Admitting: Internal Medicine

## 2016-05-12 DIAGNOSIS — J449 Chronic obstructive pulmonary disease, unspecified: Secondary | ICD-10-CM | POA: Diagnosis not present

## 2016-05-12 DIAGNOSIS — I480 Paroxysmal atrial fibrillation: Secondary | ICD-10-CM

## 2016-05-12 MED ORDER — TIOTROPIUM BROMIDE MONOHYDRATE 1.25 MCG/ACT IN AERS: 2.0000 | INHALATION_SPRAY | Freq: Every day | RESPIRATORY_TRACT | 3 refills | Status: DC

## 2016-05-12 NOTE — Assessment & Plan Note (Signed)
Rhythm is regular on exam today consistent with sinus rhythm after ablation. Followed by cardiology.

## 2016-05-12 NOTE — Assessment & Plan Note (Signed)
He noticed improved exercise tolerance since ablation. Brother than assuming he has to stay on Spiriva, I suggested he try for himself. Plan-reduce Spiriva Respimat to 1 puff daily for a few days. If stable then okay to try without it for comparison and to resume when needed

## 2016-05-12 NOTE — Patient Instructions (Signed)
Script sent refilling Spiriva. We suggested trying just one puff/ day. Then stopping if you want to see how you feel. I suspect there will at least be times when you find Spiriva does help your breathing.  Please call if we can help

## 2016-05-12 NOTE — Progress Notes (Signed)
Subjective:    Patient ID: Theodore [name], male    DOB: [birth year], 82 y.o.   MRN: [id number] HPI male never smoker followed for COPD/bronchitis, complicated by HBP, peripheral edema, GERD, A. Fib/ ablation, CAD PFT 2011 FEV1/FVC 0.65.    PFT 11/30/13- moderate obstruction, insignificant response to bronchodilator, airtrapping, minimal diffusion defect. FEV1 2.21/ 70%, FEV1/FVC 0.63, TLC 97%, DLCO 78%.  ----------------------------------------------------------------------------------------- 05/28/2015-82 year old male never smoker followed for COPD/bronchitis, Located by HBP, peripheral edema, GERD, AFib/ ablation, CAD Follows for: COPD, Chronic Bronchitis. Pt c/o some SOB with exertion, wheeze and minimal cough. Increased dyspnea on exertion with hills and stairs-he thinks it's probably mostly cardiac. Has had recurrent P A. fib. Seldom needs rescue inhaler. Anoro was no help. Inhaler does not trigger his atrial fib.  CXR 08/24/2014 IMPRESSION: COPD. There is no pneumonia, CHF, nor other acute cardiopulmonary abnormality. Electronically Signed  By: David Martinique M.D.  On: 08/24/2014 12:24-  05/12/2016-82 year old male never smoker followed for COPD/bronchitis, complicated by HBP, peripheral edema, GERD, A. Fib FOLLOWS FOR: Pt states he gets slightly SOB with exertion; Otherwise doing well. She noticed marked improvement in exercise tolerance with much less dyspnea on exertion after successful cardiac ablation. He isn't sure if he still needs Spiriva, but was reluctant to try without it. Occasional mild congestion/wheezing/cough, infrequent.  ROS-see HPI Constitutional:   No-   weight loss, night sweats, fevers, chills, fatigue, lassitude. HEENT:   No-  headaches, difficulty swallowing, tooth/dental problems, sore throat,       No-  sneezing, itching, ear ache, nasal congestion, post nasal drip,  CV:  No-   chest pain, orthopnea, PND, swelling in lower extremities, anasarca, dizziness,  palpitations Resp: + shortness of breath with exertion or at rest.              No-   productive cough,  +non-productive cough,  No- coughing up of blood.              No-   change in color of mucus.  +wheezing.   Skin: No-   rash or lesions. GI:  No-   heartburn, indigestion, abdominal pain, nausea, vomiting,  GU:  MS:  No-   joint pain or swelling.  Neuro-     nothing unusual Psych:  No- change in mood or affect. No depression or anxiety.  No memory loss.  OBJ- Physical Exam General- Alert, Oriented, Affect-appropriate, Distress- none acute, looks fit and trim Skin- +ecchymoses on arms  Lymphadenopathy- none Head- atraumatic            Eyes- Gross vision intact, PERRLA, conjunctivae and secretions clear            Ears- Hearing, canals-normal            Nose- Clear, no-Septal dev, mucus, polyps, erosion, perforation             Throat- Mallampati II , mucosa clear , drainage- none, tonsils- atrophic,  Neck- flexible , trachea midline, no stridor , thyroid nl, carotid no bruit Chest - symmetrical excursion , unlabored           Heart/CV- RR , no murmur , no gallop  , no rub, nl s1 s2                           - JVD- none , edema- none, stasis changes- none, varices- none           Lung- +  coarse  right base, wheeze- none, cough- none , dullness-none, rub- none           Chest wall-  Abd-  Br/ Gen/ Rectal- Not done, not indicated Extrem- cyanosis- none, clubbing, none, atrophy- none, strength- nl Neuro- grossly intact to observation

## 2016-06-24 ENCOUNTER — Encounter: Payer: Self-pay | Admitting: Cardiology

## 2016-06-24 ENCOUNTER — Ambulatory Visit (INDEPENDENT_AMBULATORY_CARE_PROVIDER_SITE_OTHER): Payer: Medicare Other | Admitting: Cardiology

## 2016-06-24 VITALS — BP 142/80 | HR 62 | Ht 72.0 in | Wt 219.6 lb

## 2016-06-24 DIAGNOSIS — I4819 Other persistent atrial fibrillation: Secondary | ICD-10-CM

## 2016-06-24 DIAGNOSIS — I481 Persistent atrial fibrillation: Secondary | ICD-10-CM | POA: Diagnosis not present

## 2016-06-24 NOTE — Progress Notes (Signed)
Electrophysiology Office Note   Date:  06/24/2016   ID:  Theodore [name], DOB [birth year], MRN [id number]  PCP:  [name], MD  Cardiologist:  [name] Primary Electrophysiologist:  Tracee Mccreery Meredith Leeds, MD    Chief Complaint  Patient presents with  . Follow-up    Persistent Afib     History of Present Illness: Theodore [name] is a 82 y.o. male who presents today for electrophysiology evaluation.   He has a history of hypertension, hyperlipidemia, persistent atrial fibrillation, asthma, and rheumatic fever. Theodore [name] was diagnosed with atrial fibrillation in 2014.  He was initially on Pradaxa and switched to Xarelto.  He had an episode of chest pain 08/2014 and had a Lexiscan Myoview 08/31/14 that showed an old inferior scar with peri-infarct ischemia and LVEF 49%.  He then had a LHC that revealed 70% ostial LAD, 50% prox LAD, 30% OM1 and 35% RCA.    AF ablation 12/19/15. Since that time, he is felt well without major complaint. He has no palpitations, PND, orthopnea, chest pain, dizziness, syncope, or presyncope. He does say that when he plays golf and is walking up a hill he gets mild shortness of breath, but this is not a major issue for him. He otherwise has been feeling well without major complaint.   Past Medical History:  Diagnosis Date  . History of arthritis   . History of rheumatic fever   . Hypercholesterolemia   . Hypertension   . Nocturia   . PAF (paroxysmal atrial fibrillation) [facility]) January 2013   Placed on Pradaxa. Did not require cardioversion; spontaneously converted  . Peripheral edema   . Right bundle branch block   . SOB (shortness of breath)   . Tendonitis of elbow, left    Past Surgical History:  Procedure Laterality Date  . BACK SURGERY  12/24/09   fusion C3-C4  . BACK SURGERY  2010   fusion L4-L5  . CARDIAC CATHETERIZATION  2009   NONOBSTRUCTIVE ATHERSCLEROTIC CORONARY DISEASE AND NORMAL  LV FUNCTION  . CARDIAC CATHETERIZATION N/A 09/08/2014   Procedure: Left Heart Cath and Coronary Angiography;  Surgeon: Belva Crome, MD;  Location: [place] [facility];  Service: Cardiovascular;  Laterality: N/A;  . CARDIAC CATHETERIZATION N/A 09/28/2015   Procedure: Left Heart Cath and Coronary Angiography;  Surgeon: Leonie Man, MD;  Location: [place] [facility];  Service: Cardiovascular;  Laterality: N/A;  . CARDIAC CATHETERIZATION N/A 09/28/2015   Procedure: Intravascular Pressure Wire/FFR Study;  Surgeon: Leonie Man, MD;  Location: [place] [facility];  Service: Cardiovascular;  Laterality: N/A;  . CARDIOVERSION N/A 10/23/2015   Procedure: CARDIOVERSION;  Surgeon: [name], MD;  Location: [facility] ENDOSCOPY;  Service: Cardiovascular;  Laterality: N/A;  . COLONOSCOPY  2013   per patient, rpt 5 yrs  . ELECTROPHYSIOLOGIC STUDY N/A 12/19/2015   Procedure: Atrial Fibrillation Ablation;  Surgeon: Dietrich Ke Meredith Leeds, MD;  Location: [place] [facility];  Service: Cardiovascular;  Laterality: N/A;  . KNEE SURGERY Right 2007  . PATELLAR TENDON REPAIR Left 2008  . TRICEPS TENDON REPAIR Left 2013     Current Outpatient Prescriptions  Medication Sig Dispense Refill  . Ascorbic Acid (VITAMIN C PO) Take 1 tablet by mouth daily.     [name] atorvastatin (LIPITOR) 40 MG tablet Take 1 tablet (40 mg total) by mouth daily. 90 tablet 3  . Cholecalciferol (VITAMIN D3) 2000 units TABS Take 2,000 Units by mouth daily.    [name] diltiazem (CARTIA XT)  180 MG 24 hr capsule Take 1 capsule (180 mg total) by mouth daily. 90 capsule 3  . ezetimibe (ZETIA) 10 MG tablet Take 1 tablet (10 mg total) by mouth daily. 90 tablet 3  . furosemide (LASIX) 20 MG tablet Take 1 tablet (20 mg total) by mouth daily. 90 tablet 3  . losartan (COZAAR) 100 MG tablet Take 1 tablet (100 mg total) by mouth daily. 90 tablet 3  . LYSINE PO Take 1 tablet by mouth daily.     . metoprolol succinate (TOPROL-XL) 100 MG 24 hr tablet TAKE 1 TABLET (100 MG TOTAL) BY MOUTH DAILY. TAKE WITH OR IMMEDIATELY  FOLLOWING A MEAL. 30 tablet 0  . metoprolol succinate (TOPROL-XL) 50 MG 24 hr tablet TAKE 1 TABLET (50 MG TOTAL) BY MOUTH DAILY. TAKE WITH OR IMMEDIATELY FOLLOWING A MEAL. 30 tablet 0  . naproxen sodium (ANAPROX) 220 MG tablet Take 220 mg by mouth daily as needed (for aches/pain.).     [name] omeprazole (PRILOSEC) 20 MG capsule Take 20 mg by mouth daily.      . potassium chloride (K-DUR,KLOR-CON) 10 MEQ tablet Take 10 mEq by mouth 2 (two) times daily.    . Tiotropium Bromide Monohydrate (SPIRIVA RESPIMAT) 1.25 MCG/ACT AERS Inhale 2 puffs into the lungs daily. 3 Inhaler 3  . XARELTO 20 MG TABS tablet TAKE 1 TABLET DAILY WITH SUPPER 90 tablet 1   No current facility-administered medications for this visit.     Allergies:   Penicillins; Amlodipine; Lisinopril; Zocor [simvastatin]; and Tamiflu   Social History:  The patient  reports that he has never smoked. He has never used smokeless tobacco. He reports that he drinks about 1.2 oz of alcohol per week . He reports that he does not use drugs.   Family History:  The patient's family history includes Asthma in his sister; COPD in his brother and father; Heart attack in his father; Heart disease in his brother; Hypertension in his father.    ROS:  Please see the history of present illness.   Otherwise, review of systems is positive for none.   All other systems are reviewed and negative.    PHYSICAL EXAM: VS:  BP (!) 142/80   Pulse 62   Ht 6' (1.829 m)   Wt 219 lb 9.6 oz (99.6 kg)   BMI 29.78 kg/m  , BMI Body mass index is 29.78 kg/m. GEN: Well nourished, well developed, in no acute distress  HEENT: normal  Neck: no JVD, carotid bruits, or masses Cardiac: RRR; no murmurs, rubs, or gallops,no edema  Respiratory:  clear to auscultation bilaterally, normal work of breathing GI: soft, nontender, nondistended, + BS MS: no deformity or atrophy  Skin: warm and dry Neuro:  Strength and sensation are intact Psych: euthymic mood, full  affect   EKG:  EKG is not ordered today. Personal review of the ekg ordered 03/24/16 shows Sinus rhythm, first-degree AV block, right bundle branch block, rate 68   Recent Labs: 08/07/2015: Pro B Natriuretic peptide (BNP) 232.0; TSH 1.64 12/05/2015: Hemoglobin 16.0; Platelets 143 01/31/2016: ALT 22; BUN 15; Creatinine, Ser 0.99; Potassium 4.5; Sodium 135    Lipid Panel     Component Value Date/Time   CHOL 126 01/31/2016 0850   TRIG 62.0 01/31/2016 0850   HDL 51.90 01/31/2016 0850   CHOLHDL 2 01/31/2016 0850   VLDL 12.4 01/31/2016 0850   LDLCALC 62 01/31/2016 0850     Wt Readings from Last 3 Encounters:  06/24/16 219 lb 9.6  oz (99.6 kg)  05/12/16 220 lb (99.8 kg)  03/24/16 220 lb 9.6 oz (100.1 kg)      Other studies Reviewed: Additional studies/ records that were reviewed today include: SPECT 10/17/15, TTE 10/09/14  Review of the above records today demonstrates:   The left ventricular ejection fraction is moderately decreased (30-44%).  Nuclear stress EF: 39%.  There was no ST segment deviation noted during stress.  There is a small defect of moderate severity present in the apical lateral and apex location and medium sized defect of moderate severity in the basal inferior, mild inferior and apical inferior location. These defects are fixed and consistent with scar. No ischemia noted.  This is an intermediate risk study due to LV dysfunction.   - Left ventricle: The cavity size was normal. Wall thickness was   normal. Systolic function was normal. The estimated ejection   fraction was in the range of 55% to 60%. Wall motion was normal;   there were no regional wall motion abnormalities. Doppler   parameters are consistent with abnormal left ventricular   relaxation (grade 1 diastolic dysfunction). - Aortic valve: There was mild regurgitation. - Mitral valve: There was mild regurgitation. - Left atrium: The atrium was mildly dilated. - Right ventricle: The cavity size  was mildly dilated. - Right atrium: The atrium was mildly dilated. - Pulmonary arteries: Systolic pressure was mildly increased. PA   peak pressure: 36 mm Hg (S).  Impressions:  - Normal LV systolic function; grade 1 diastolic dysfunction; mild   biatrial enlargement; mild RVE; calcified aortic valve with mild   AI; mild MR; trace TR with mildly elevated pulmonary pressure.   ASSESSMENT AND PLAN:  1.  Persistent atrial fibrillation: AF ablation 12/19/15. on Xarelto, metoprolol, diltiazem. Doing well without major complaint. Continue current management.  This patients CHA2DS2-VASc Score and unadjusted Ischemic Stroke Rate (% per year) is equal to 3.2 % stroke rate/year from a score of 3  Above score calculated as 1 point each if present [CHF, HTN, DM, Vascular=MI/PAD/Aortic Plaque, Age if 65-74, or Male] Above score calculated as 2 points each if present [Age > 75, or Stroke/TIA/TE]   2. CAD: No chest pain.  3. Chronic systolic heart failure: On Aldactone, metoprolol, and losartan. Nuclear stress test showed an EF of 39%, which was corroborated by cardiac catheterization. Currently mild shortness of breath, likely class II heart failure.  4. Hypertension: Mildly elevated today, but well-controlled at home  5. Hyperlipidemia: Continue Lipitor  6. Ectatic abdominal aorta: Found by ultrasound on 09/30/13. Radiology recommends five-year follow-up ultrasound.  Current medicines are reviewed at length with the patient today.   The patient does not have concerns regarding his medicines.  The following changes were made today:  none  Labs/ tests ordered today include:  No orders of the defined types were placed in this encounter.    Disposition:   FU with Pami Wool 6 months  Signed, Rider Ermis Meredith Leeds, MD  06/24/2016 9:07 AM     [facility] [address] [place] [place] [address] [phone] (office) [phone] (fax)

## 2016-06-24 NOTE — Patient Instructions (Signed)
Medication Instructions:    Your physician recommends that you continue on your current medications as directed. Please refer to the Current Medication list given to you today.  Labwork:  None ordered  Testing/Procedures:  None ordered  Follow-Up:  Your physician wants you to follow-up in: 6 months with Dr. Camnitz.  You will receive a reminder letter in the mail two months in advance. If you don't receive a letter, please call our office to schedule the follow-up appointment.  - If you need a refill on your cardiac medications before your next appointment, please call your pharmacy.    Thank you for choosing [facility]!!   Oakley Kossman, RN [phone]         

## 2016-06-29 ENCOUNTER — Other Ambulatory Visit: Payer: Self-pay | Admitting: Physician Assistant

## 2016-07-17 ENCOUNTER — Ambulatory Visit (INDEPENDENT_AMBULATORY_CARE_PROVIDER_SITE_OTHER): Payer: Medicare Other

## 2016-07-17 VITALS — BP 122/70 | HR 60 | Temp 98.4°F | Ht 70.25 in | Wt 217.2 lb

## 2016-07-17 DIAGNOSIS — Z0000 Encounter for general adult medical examination without abnormal findings: Secondary | ICD-10-CM | POA: Diagnosis not present

## 2016-07-17 NOTE — Progress Notes (Signed)
Pre visit review using our clinic review tool, if applicable. No additional management support is needed unless otherwise documented below in the visit note. 

## 2016-07-17 NOTE — Progress Notes (Signed)
I reviewed health advisor's note, was available for consultation, and agree with documentation and plan.  

## 2016-07-17 NOTE — Progress Notes (Signed)
PCP notes:   Health maintenance:  No gaps identified.  Abnormal screenings:   Hearing - failed  Patient concerns:   None  Nurse concerns:  None  Next PCP appt:   01/13/17 @ 0830

## 2016-07-17 NOTE — Progress Notes (Signed)
Subjective:   Theodore [name] is a 82 y.o. male who presents for Medicare Annual/Subsequent preventive examination.  Review of Systems:  N/A Cardiac Risk Factors include: advanced age (>83men, >106 women);male gender;dyslipidemia;obesity (BMI >30kg/m2);hypertension     Objective:    Vitals: BP 122/70 (BP Location: Right Arm, Patient Position: Sitting, Cuff Size: Normal)   Pulse 60   Temp 98.4 F (36.9 C) (Oral)   Ht 5' 10.25" (1.784 m) Comment: no shoes  Wt 217 lb 4 oz (98.5 kg)   SpO2 96%   BMI 30.95 kg/m   Body mass index is 30.95 kg/m.  Tobacco History  Smoking Status  . Never Smoker  Smokeless Tobacco  . Never Used     Counseling given: No   Past Medical History:  Diagnosis Date  . History of arthritis   . History of rheumatic fever   . Hypercholesterolemia   . Hypertension   . Nocturia   . PAF (paroxysmal atrial fibrillation) [facility]) January 2013   Placed on Pradaxa. Did not require cardioversion; spontaneously converted  . Peripheral edema   . Right bundle branch block   . SOB (shortness of breath)   . Tendonitis of elbow, left    Past Surgical History:  Procedure Laterality Date  . BACK SURGERY  12/24/09   fusion C3-C4  . BACK SURGERY  2010   fusion L4-L5  . CARDIAC CATHETERIZATION  2009   NONOBSTRUCTIVE ATHERSCLEROTIC CORONARY DISEASE AND NORMAL  LV FUNCTION  . CARDIAC CATHETERIZATION N/A 09/08/2014   Procedure: Left Heart Cath and Coronary Angiography;  Surgeon: Belva Crome, MD;  Location: [place] [facility];  Service: Cardiovascular;  Laterality: N/A;  . CARDIAC CATHETERIZATION N/A 09/28/2015   Procedure: Left Heart Cath and Coronary Angiography;  Surgeon: Leonie Man, MD;  Location: [place] [facility];  Service: Cardiovascular;  Laterality: N/A;  . CARDIAC CATHETERIZATION N/A 09/28/2015   Procedure: Intravascular Pressure Wire/FFR Study;  Surgeon: Leonie Man, MD;  Location: [place] [facility];  Service: Cardiovascular;  Laterality: N/A;    . CARDIOVERSION N/A 10/23/2015   Procedure: CARDIOVERSION;  Surgeon: [name], MD;  Location: [facility] ENDOSCOPY;  Service: Cardiovascular;  Laterality: N/A;  . COLONOSCOPY  2013   per patient, rpt 5 yrs  . ELECTROPHYSIOLOGIC STUDY N/A 12/19/2015   Procedure: Atrial Fibrillation Ablation;  Surgeon: [name], MD;  Location: [place] [facility];  Service: Cardiovascular;  Laterality: N/A;  . KNEE SURGERY Right 2007  . KNEE SURGERY Left 03/2016   Dr. [name]  . PATELLAR TENDON REPAIR Left 2008  . TRICEPS TENDON REPAIR Left 2013   Family History  Problem Relation Age of Onset  . Heart attack Father   . COPD Father   . Hypertension Father   . Heart disease Brother   . COPD Brother   . Asthma Sister   . Stroke Neg Hx    History  Sexual Activity  . Sexual activity: No    Outpatient Encounter Prescriptions as of 07/17/2016  Medication Sig  . Ascorbic Acid (VITAMIN C PO) Take 1 tablet by mouth daily.   [name] atorvastatin (LIPITOR) 40 MG tablet TAKE 1 TABLET DAILY  . Cholecalciferol (VITAMIN D3) 2000 units TABS Take 2,000 Units by mouth daily.  [name] diltiazem (CARTIA XT) 180 MG 24 hr capsule Take 1 capsule (180 mg total) by mouth daily.  [name] ezetimibe (ZETIA) 10 MG tablet TAKE 1 TABLET DAILY  . losartan (COZAAR) 100 MG tablet Take 1 tablet (  100 mg total) by mouth daily.  [name] LYSINE PO Take 1 tablet by mouth daily.   . naproxen sodium (ANAPROX) 220 MG tablet Take 220 mg by mouth daily as needed (for aches/pain.).   [name] omeprazole (PRILOSEC) 20 MG capsule Take 20 mg by mouth daily.    . potassium chloride (K-DUR,KLOR-CON) 10 MEQ tablet Take 10 mEq by mouth 2 (two) times daily.  . Tiotropium Bromide Monohydrate (SPIRIVA RESPIMAT) 1.25 MCG/ACT AERS Inhale 2 puffs into the lungs daily.  Alveda Reasons 20 MG TABS tablet TAKE 1 TABLET DAILY WITH SUPPER  . furosemide (LASIX) 20 MG tablet Take 1 tablet (20 mg total) by mouth daily.  . metoprolol succinate (TOPROL-XL) 100 MG 24 hr tablet TAKE 1 TABLET  (100 MG TOTAL) BY MOUTH DAILY. TAKE WITH OR IMMEDIATELY FOLLOWING A MEAL.  . metoprolol succinate (TOPROL-XL) 50 MG 24 hr tablet TAKE 1 TABLET (50 MG TOTAL) BY MOUTH DAILY. TAKE WITH OR IMMEDIATELY FOLLOWING A MEAL.   No facility-administered encounter medications on file as of 07/17/2016.     Activities of Daily Living In your present state of health, do you have any difficulty performing the following activities: 07/17/2016 12/19/2015  Hearing? N N  Vision? N N  Difficulty concentrating or making decisions? N N  Walking or climbing stairs? Y N  Dressing or bathing? N N  Doing errands, shopping? N -  Preparing Food and eating ? N -  Using the Toilet? N -  In the past six months, have you accidently leaked urine? N -  Do you have problems with loss of bowel control? N -  Managing your Medications? N -  Managing your Finances? N -  Housekeeping or managing your Housekeeping? N -  Some recent data might be hidden    Patient Care Team: [name], MD as PCP - General (Family Medicine) Skeet Latch, MD as Attending Physician (Cardiology) Druscilla Brownie, MD as Consulting Physician (Dermatology) Deneise Lever, MD as Consulting Physician (Pulmonary Disease) Dingeldein, Remo Lipps, MD as Referring Physician (Ophthalmology)   Assessment:     Hearing Screening   125Hz  250Hz  500Hz  1000Hz  2000Hz  3000Hz  4000Hz  6000Hz  8000Hz   Right ear:   40 40 40  0    Left ear:   40 40 40  0    Vision Screening Comments: Last vision exam in Nov 2017 with Dr. Sandra Cockayne   Exercise Activities and Dietary recommendations Current Exercise Habits: Home exercise routine, Type of exercise: walking;Other - see comments (golf), Time (Minutes): 15, Frequency (Times/Week): 2, Weekly Exercise (Minutes/Week): 30, Intensity: Moderate, Exercise limited by: None identified  Goals    . Increase water intake          Starting 07/17/2016, I will attempt to drink at least 6-8 glasses of water daily.        Fall Risk Fall Risk  07/17/2016 02/06/2016 08/07/2015 08/07/2015  Falls in the past year? No No No No   Depression Screen PHQ 2/9 Scores 07/17/2016 02/06/2016 08/07/2015 08/07/2015  PHQ - 2 Score 0 0 0 0    Cognitive Function MMSE - Mini Mental State Exam 07/17/2016 08/07/2015  Orientation to time 5 5  Orientation to Place 5 5  Registration 3 3  Attention/ Calculation 0 0  Recall 3 3  Language- name 2 objects 0 0  Language- repeat 1 1  Language- follow 3 step command 3 3  Language- read & follow direction 0 0  Write a sentence 0 0  Copy design 0 0  Total score 20 20       PLEASE NOTE: A Mini-Cog screen was completed. Maximum score is 20. A value of 0 denotes this part of Folstein MMSE was not completed or the patient failed this part of the Mini-Cog screening.   Mini-Cog Screening Orientation to Time - Max 5 pts Orientation to Place - Max 5 pts Registration - Max 3 pts Recall - Max 3 pts Language Repeat - Max 1 pts Language Follow 3 Step Command - Max 3 pts   Immunization History  Administered Date(s) Administered  . Influenza Split 11/11/2010, 11/11/2011, 11/23/2012  . Influenza Whole 11/10/2009  . Influenza,inj,Quad PF,36+ Mos 10/25/2015  . Influenza-Unspecified 11/10/2013, 10/23/2014  . Pneumococcal Conjugate-13 10/23/2014  . Pneumococcal Polysaccharide-23 11/10/2009  . Zoster 02/11/2012   Screening Tests Health Maintenance  Topic Date Due  . DTaP/Tdap/Td (1 - Tdap) 08/06/2017 (Originally 09/12/1953)  . TETANUS/TDAP  08/06/2017 (Originally 09/12/1953)  . INFLUENZA VACCINE  09/10/2016  . PNA vac Low Risk Adult  Completed      Plan:     I have personally reviewed and addressed the Medicare Annual Wellness questionnaire and have noted the following in the patient's chart:  A. Medical and social history B. Use of alcohol, tobacco or illicit drugs  C. Current medications and supplements D. Functional ability and status E.  Nutritional status F.  Physical  activity G. Advance directives H. List of other physicians I.  Hospitalizations, surgeries, and ER visits in previous 12 months J.  [place] to include hearing, vision, cognitive, depression L. Referrals and appointments - none  In addition, I have reviewed and discussed with patient certain preventive protocols, quality metrics, and best practice recommendations. A written personalized care plan for preventive services as well as general preventive health recommendations were provided to patient.  See attached scanned questionnaire for additional information.   Signed,   Lindell Noe, MHA, BS, LPN Health Coach

## 2016-07-17 NOTE — Patient Instructions (Signed)
Theodore [name] , Thank you for taking time to come for your Medicare Wellness Visit. I appreciate your ongoing commitment to your health goals. Please review the following plan we discussed and let me know if I can assist you in the future.   These are the goals we discussed: Goals    . Increase water intake          Starting 07/17/2016, I will attempt to drink at least 6-8 glasses of water daily.        This is a list of the screening recommended for you and due dates:  Health Maintenance  Topic Date Due  . DTaP/Tdap/Td vaccine (1 - Tdap) 08/06/2017*  . Tetanus Vaccine  08/06/2017*  . Flu Shot  09/10/2016  . Pneumonia vaccines  Completed  *Topic was postponed. The date shown is not the original due date.   Preventive Care for Adults  A healthy lifestyle and preventive care can promote health and wellness. Preventive health guidelines for adults include the following key practices.  . A routine yearly physical is a good way to check with your health care provider about your health and preventive screening. It is a chance to share any concerns and updates on your health and to receive a thorough exam.  . Visit your dentist for a routine exam and preventive care every 6 months. Brush your teeth twice a day and floss once a day. Good oral hygiene prevents tooth decay and gum disease.  . The frequency of eye exams is based on your age, health, family medical history, use  of contact lenses, and other factors. Follow your health care provider's ecommendations for frequency of eye exams.  . Eat a healthy diet. Foods like vegetables, fruits, whole grains, low-fat dairy products, and lean protein foods contain the nutrients you need without too many calories. Decrease your intake of foods high in solid fats, added sugars, and salt. Eat the right amount of calories for you. Get information about a proper diet from your health care provider, if necessary.  . Regular physical exercise is one of the  most important things you can do for your health. Most adults should get at least 150 minutes of moderate-intensity exercise (any activity that increases your heart rate and causes you to sweat) each week. In addition, most adults need muscle-strengthening exercises on 2 or more days a week.  Silver Sneakers may be a benefit available to you. To determine eligibility, you may visit the website: www.silversneakers.com or contact program at 773-823-4920 Mon-Fri between 8AM-8PM.   . Maintain a healthy weight. The body mass index (BMI) is a screening tool to identify possible weight problems. It provides an estimate of body fat based on height and weight. Your health care provider can find your BMI and can help you achieve or maintain a healthy weight.   For adults 20 years and older: ? A BMI below 18.5 is considered underweight. ? A BMI of 18.5 to 24.9 is normal. ? A BMI of 25 to 29.9 is considered overweight. ? A BMI of 30 and above is considered obese.   . Maintain normal blood lipids and cholesterol levels by exercising and minimizing your intake of saturated fat. Eat a balanced diet with plenty of fruit and vegetables. Blood tests for lipids and cholesterol should begin at age 63 and be repeated every 5 years. If your lipid or cholesterol levels are high, you are over 50, or you are at high risk for heart disease, you may  need your cholesterol levels checked more frequently. Ongoing high lipid and cholesterol levels should be treated with medicines if diet and exercise are not working.  . If you smoke, find out from your health care provider how to quit. If you do not use tobacco, please do not start.  . If you choose to drink alcohol, please do not consume more than 2 drinks per day. One drink is considered to be 12 ounces (355 mL) of beer, 5 ounces (148 mL) of wine, or 1.5 ounces (44 mL) of liquor.  . If you are 44-3 years old, ask your health care provider if you should take aspirin to  prevent strokes.  . Use sunscreen. Apply sunscreen liberally and repeatedly throughout the day. You should seek shade when your shadow is shorter than you. Protect yourself by wearing long sleeves, pants, a wide-brimmed hat, and sunglasses year round, whenever you are outdoors.  . Once a month, do a whole body skin exam, using a mirror to look at the skin on your back. Tell your health care provider of new moles, moles that have irregular borders, moles that are larger than a pencil eraser, or moles that have changed in shape or color.

## 2016-07-29 ENCOUNTER — Encounter: Payer: Self-pay | Admitting: Cardiovascular Disease

## 2016-07-29 ENCOUNTER — Ambulatory Visit (INDEPENDENT_AMBULATORY_CARE_PROVIDER_SITE_OTHER): Payer: Medicare Other | Admitting: Cardiovascular Disease

## 2016-07-29 VITALS — BP 134/66 | HR 64 | Ht 70.25 in | Wt 219.0 lb

## 2016-07-29 DIAGNOSIS — E7800 Pure hypercholesterolemia, unspecified: Secondary | ICD-10-CM | POA: Diagnosis not present

## 2016-07-29 DIAGNOSIS — I5042 Chronic combined systolic (congestive) and diastolic (congestive) heart failure: Secondary | ICD-10-CM

## 2016-07-29 DIAGNOSIS — I480 Paroxysmal atrial fibrillation: Secondary | ICD-10-CM

## 2016-07-29 DIAGNOSIS — Z79899 Other long term (current) drug therapy: Secondary | ICD-10-CM | POA: Diagnosis not present

## 2016-07-29 DIAGNOSIS — I10 Essential (primary) hypertension: Secondary | ICD-10-CM

## 2016-07-29 NOTE — Progress Notes (Signed)
Cardiology Office Note   Date:  07/29/2016   ID:  Theodore [name], DOB 30-Nov-1934, MRN [id number]  PCP:  [name], MD  Cardiologist:   Skeet Latch, MD   Chief Complaint  Patient presents with  . Appointment    some SHOB with weather. Some tiredness. some swelling in ankles. no other complaints.     History of Present Illness: Theodore [name] is a 82 y.o. male with hypertension, hyperlipidemia, paroxysmal atrial fibrillation, asthma and Rheumatic fever who presents for follow up.  Mr. Benish was previously a patient of Dr. Mare Ferrari.  Mr. Nohr was diagnosed with atrial fibrillation in 2014.  He was initially on Pradaxa and switched to Xarelto.  He had an episode of chest pain 08/2014 and had a Lexiscan Myoview 08/31/14 that showed an old inferior scar with peri-infarct ischemia and LVEF 49%.  He then had a LHC that revealed 70% ostial LAD, 50% prox LAD, 30% OM1 and 35% RCA.  He was managed medically.  He subsequently had an echo 09/2014 that showed LVEF 55-60% with mild MR and AR and grade 1 diastolic dysfunction.  At that time atorvastatin was increased to 80 mg but he noted leg pain.  He was seen in the lipid clinic and started back on atorvastatin 40mg  and zetia 10 mg.    Mr. Wernick was seen in clinic 09/25/15 and reported significant shortness of breath with minimal exertion.  He had a LHC with Dr. Ellyn Hack on 09/28/15 that again revealed a 70% ostial-proximal LAD lesion.  Dr. Ellyn Hack performed FFR on that lesion and there was no significant change.  The left ventriculogram performed at that time revealed LVEF 35-45%.  Mr. Billig continued to have exertional dyspnea so he was referred for [facility] 10/17/15 that was negative for ischemia. He subsequently underwent DCCV on 10/23/15.  He underwent ablation on 12/19/15 with Dr. [name].  He last followed up with Dr. [name] 06/2016 and was doing well.  He reports that his energy is much better than this time last year.  He has remained  in sinus rhythm.  He denies chest pain but does have shortness of breath when walking up stairs or when it is humid outside.  He has mild edema to the ankles at the end of the day.  He denies orthopnea or PND.  His BP has been well-controlled at home.  He is now able to play a round of golf again.    Past Medical History:  Diagnosis Date  . History of arthritis   . History of rheumatic fever   . Hypercholesterolemia   . Hypertension   . Nocturia   . PAF (paroxysmal atrial fibrillation) [facility]) January 2013   Placed on Pradaxa. Did not require cardioversion; spontaneously converted  . Peripheral edema   . Right bundle branch block   . SOB (shortness of breath)   . Tendonitis of elbow, left     Past Surgical History:  Procedure Laterality Date  . BACK SURGERY  12/24/09   fusion C3-C4  . BACK SURGERY  2010   fusion L4-L5  . CARDIAC CATHETERIZATION  2009   NONOBSTRUCTIVE ATHERSCLEROTIC CORONARY DISEASE AND NORMAL  LV FUNCTION  . CARDIAC CATHETERIZATION N/A 09/08/2014   Procedure: Left Heart Cath and Coronary Angiography;  Surgeon: Belva Crome, MD;  Location: [place] CV LAB;  Service: Cardiovascular;  Laterality: N/A;  . CARDIAC CATHETERIZATION N/A 09/28/2015   Procedure: Left Heart Cath and Coronary Angiography;  Surgeon: Leonie Green  Ellyn Hack, MD;  Location: [place] CV LAB;  Service: Cardiovascular;  Laterality: N/A;  . CARDIAC CATHETERIZATION N/A 09/28/2015   Procedure: Intravascular Pressure Wire/FFR Study;  Surgeon: Leonie Man, MD;  Location: [place] CV LAB;  Service: Cardiovascular;  Laterality: N/A;  . CARDIOVERSION N/A 10/23/2015   Procedure: CARDIOVERSION;  Surgeon: [name], MD;  Location: [facility] ENDOSCOPY;  Service: Cardiovascular;  Laterality: N/A;  . COLONOSCOPY  2013   per patient, rpt 5 yrs  . ELECTROPHYSIOLOGIC STUDY N/A 12/19/2015   Procedure: Atrial Fibrillation Ablation;  Surgeon: [name], MD;  Location: [place] CV LAB;  Service:  Cardiovascular;  Laterality: N/A;  . KNEE SURGERY Right 2007  . KNEE SURGERY Left 03/2016   Dr. [name]  . PATELLAR TENDON REPAIR Left 2008  . TRICEPS TENDON REPAIR Left 2013     Current Outpatient Prescriptions  Medication Sig Dispense Refill  . Ascorbic Acid (VITAMIN C PO) Take 1 tablet by mouth daily.     [name] atorvastatin (LIPITOR) 40 MG tablet TAKE 1 TABLET DAILY 90 tablet 3  . Cholecalciferol (VITAMIN D3) 2000 units TABS Take 2,000 Units by mouth daily.    [name] diltiazem (CARTIA XT) 180 MG 24 hr capsule Take 1 capsule (180 mg total) by mouth daily. 90 capsule 3  . ezetimibe (ZETIA) 10 MG tablet TAKE 1 TABLET DAILY 90 tablet 3  . furosemide (LASIX) 20 MG tablet Take 1 tablet (20 mg total) by mouth daily. 90 tablet 3  . losartan (COZAAR) 100 MG tablet Take 1 tablet (100 mg total) by mouth daily. 90 tablet 3  . LYSINE PO Take 1 tablet by mouth daily.     . metoprolol succinate (TOPROL-XL) 100 MG 24 hr tablet TAKE 1 TABLET (100 MG TOTAL) BY MOUTH DAILY. TAKE WITH OR IMMEDIATELY FOLLOWING A MEAL. 30 tablet 0  . metoprolol succinate (TOPROL-XL) 50 MG 24 hr tablet TAKE 1 TABLET (50 MG TOTAL) BY MOUTH DAILY. TAKE WITH OR IMMEDIATELY FOLLOWING A MEAL. 30 tablet 0  . naproxen sodium (ANAPROX) 220 MG tablet Take 220 mg by mouth daily as needed (for aches/pain.).     [name] omeprazole (PRILOSEC) 20 MG capsule Take 20 mg by mouth daily.      . potassium chloride (K-DUR,KLOR-CON) 10 MEQ tablet Take 10 mEq by mouth 2 (two) times daily.    . Tiotropium Bromide Monohydrate (SPIRIVA RESPIMAT) 1.25 MCG/ACT AERS Inhale 2 puffs into the lungs daily. 3 Inhaler 3  . XARELTO 20 MG TABS tablet TAKE 1 TABLET DAILY WITH SUPPER 90 tablet 1   No current facility-administered medications for this visit.     Allergies:   Penicillins; Amlodipine; Lisinopril; Zocor [simvastatin]; and Tamiflu    Social History:  The patient  reports that he has never smoked. He has never used smokeless tobacco. He reports that he drinks  about 1.2 oz of alcohol per week . He reports that he does not use drugs.   Family History:  The patient's family history includes Asthma in his sister; COPD in his brother and father; Heart attack in his father; Heart disease in his brother; Hypertension in his father.    ROS:  Please see the history of present illness.   Otherwise, review of systems are positive for none.   All other systems are reviewed and negative.    PHYSICAL EXAM: VS:  BP 134/66   Pulse 64   Ht 5' 10.25" (1.784 m)   Wt 99.3 kg (219 lb)   BMI  31.20 kg/m  , BMI Body mass index is 31.2 kg/m. GENERAL:  Well appearing.  No acute distress. HEENT:  Pupils equal round and reactive, fundi not visualized, oral mucosa unremarkable NECK:  No jugular venous distention, waveform within normal limits, carotid upstroke brisk and symmetric, no bruit LUNGS:  Clear to auscultation bilaterally.  No crackles, rhonchi, or wheezes. HEART:  RRR. PMI not displaced or sustained,S1 and S2 within normal limits, no S3, no S4, no clicks, no rubs, no  murmurs ABD:  Flat, positive bowel sounds normal in frequency in pitch, no bruits, no rebound, no guarding, no midline pulsatile mass, no hepatomegaly, no splenomegaly EXT:  2 plus pulses throughout, no edema, no cyanosis no clubbing SKIN:  No rashes no nodules NEURO:  Cranial nerves II through XII grossly intact, motor grossly intact throughout PSYCH:  Cognitively intact, oriented to person place and time   EKG:  EKG is not ordered today. 10/30/15: Sinus rhythm.  Rate 60 bpm.  RBBB.  LAD.  The ekg ordered 09/25/15 demonstrates atrial fibrillation.  Rate 84 bpm.   Lexiscan Myoview 10/17/15:  The left ventricular ejection fraction is moderately decreased (30-44%).  Nuclear stress EF: 39%.  There was no ST segment deviation noted during stress.  There is a small defect of moderate severity present in the apical lateral and apex location and medium sized defect of moderate severity in the  basal inferior, mild inferior and apical inferior location. These defects are fixed and consistent with scar. No ischemia noted.  This is an intermediate risk study due to LV dysfunction.   LHC 09/28/15:  Ostial to proximal LAD stenoses of this at least moderately calcified. 70% focal stenosis followed by diffuse 50% stenosis.  FFR evaluation of the combination of the LAD lesions showed no significant drop in baseline FFR from 0.96 down to 0.95. This suggests physiologically not significant lesion.  There is moderate left ventricular systolic dysfunction. The left ventricular ejection fraction is 35-45% by visual estimate.  LV end diastolic pressure is moderately elevated.  LHC 09/08/14:   1. 1st Mrg lesion, 30% stenosed. 2. Prox LAD lesion, 50% stenosed. 3. Ost LAD lesion, 70% stenosed. 4. Prox RCA to Dist RCA lesion, 35% stenosed.   50-70% ostial/proximal LAD stenosis within the heavily calcified segment. Recent myocardial perfusion energy and within the past week did not demonstrate anterior ischemia.  Heavily calcified but widely patent circumflex and right coronary with luminal irregularities noted in the proximal mid and distal segments.  Mild global left ventricular dysfunction with an ejection fraction in the 45-50% range   Recommendations:   The patient's symptoms are mixed. With exertion he experiences dyspnea. At rest and randomly he has neck jaw and left elbow discomfort. When both the angiogram and the myocardial perfusion study were considered together, no justification for percutaneous intervention on the LAD could be made.  Close clinical follow-up  Aggressive risk factor modification    Recent Labs: 08/07/2015: Pro B Natriuretic peptide (BNP) 232.0; TSH 1.64 12/05/2015: Hemoglobin 16.0; Platelets 143 01/31/2016: ALT 22; BUN 15; Creatinine, Ser 0.99; Potassium 4.5; Sodium 135    Lipid Panel    Component Value Date/Time   CHOL 126 01/31/2016 0850    TRIG 62.0 01/31/2016 0850   HDL 51.90 01/31/2016 0850   CHOLHDL 2 01/31/2016 0850   VLDL 12.4 01/31/2016 0850   LDLCALC 62 01/31/2016 0850      Wt Readings from Last 3 Encounters:  07/29/16 99.3 kg (219 lb)  07/17/16 98.5 kg (217  lb 4 oz)  06/24/16 99.6 kg (219 lb 9.6 oz)      ASSESSMENT AND PLAN:  # Paroxysmal atrial fibrillation: Mr. Maney remains in sinus rhythm.  He underwent ablation with Dr. [name] 12/2015. Continue  Metoprolol, diltiazem, and Xarelto.  This patients CHA2DS2-VASc Score and unadjusted Ischemic Stroke Rate (% per year) is equal to 7.2 % stroke rate/year from a score of 5  Above score calculated as 1 point each if present [CHF, HTN, DM, Vascular=MI/PAD/Aortic Plaque, Age if 65-74, or Male] Above score calculated as 2 points each if present [Age > 75, or Stroke/TIA/TE]   # CAD: Mr. Wurtz has a known, 70% ostial LAD lesion.  He remains asymptomatic with medical management.  He is not on aspirin or P2Y12 ihnibitor because he is on Xarelto. Continue atorvastatin, Zetia and metoprolol.  # Chronic systolic and diastolic heart failure: LVEF on LHC was 35-45% and 39% on Lexiscan Myoview.  He remains euvolemic on exam.  He did not tolerate spironolactone due to hyperkalemia. Continue furosemide, losartan, and metoprolol succinate.  # Hypertension: Blood pressure is well-controlled.  Continue metoprolol, losartan, and diltiazem.    # Hyperlipidemia: LDL 62 on 01/2016.  Continue atorvastatin and Zetia.    Current medicines are reviewed at length with the patient today.  The patient does not have concerns regarding medicines.  The following changes have been made:  None  Labs/ tests ordered today include:   Orders Placed This Encounter  Procedures  . Lipid panel  . Comprehensive metabolic panel  . CBC with Differential/Platelet     Disposition:   FU with Jayne Peckenpaugh C. [name], MD, [facility] in 6 months.    This note was written with the assistance of speech  recognition software.  Please excuse any transcriptional errors.  Signed, Ashelyn Mccravy C. [name], MD, [facility]  07/29/2016 8:18 AM    [place]

## 2016-07-29 NOTE — Patient Instructions (Signed)
Medication Instructions:  Your physician recommends that you continue on your current medications as directed. Please refer to the Current Medication list given to you today.  Labwork: LP/CMET/CBC TODAY   Testing/Procedures: NONE  Follow-Up: Your physician wants you to follow-up in: [place] will receive a reminder letter in the mail two months in advance. If you don't receive a letter, please call our office to schedule the follow-up appointment.  If you need a refill on your cardiac medications before your next appointment, please call your pharmacy.

## 2016-07-30 LAB — COMPREHENSIVE METABOLIC PANEL
ALT: 24 IU/L (ref 0–44)
AST: 26 IU/L (ref 0–40)
Albumin/Globulin Ratio: 2 (ref 1.2–2.2)
Albumin: 4.5 g/dL (ref 3.5–4.8)
Alkaline Phosphatase: 77 IU/L (ref 39–117)
BUN/Creatinine Ratio: 14 (ref 10–24)
BUN: 13 mg/dL (ref 8–27)
Bilirubin Total: 0.8 mg/dL (ref 0.0–1.2)
CO2: 23 mmol/L (ref 20–29)
CREATININE: 0.93 mg/dL (ref 0.76–1.27)
Calcium: 9.6 mg/dL (ref 8.6–10.2)
Chloride: 95 mmol/L — ABNORMAL LOW (ref 96–106)
GFR, EST AFRICAN AMERICAN: 90 mL/min/{1.73_m2} (ref >59)
GFR, EST NON AFRICAN AMERICAN: 78 mL/min/{1.73_m2} (ref >59)
GLOBULIN, TOTAL: 2.3 g/dL (ref 1.5–4.5)
Glucose: 107 mg/dL — ABNORMAL HIGH (ref 65–99)
Potassium: 4.5 mmol/L (ref 3.5–5.2)
SODIUM: 134 mmol/L (ref 134–144)
TOTAL PROTEIN: 6.8 g/dL (ref 6.0–8.5)

## 2016-07-30 LAB — LIPID PANEL
CHOLESTEROL TOTAL: 121 mg/dL (ref 100–199)
Chol/HDL Ratio: 2.2 ratio (ref 0.0–5.0)
HDL: 56 mg/dL (ref >39)
LDL Calculated: 56 mg/dL (ref 0–99)
Triglycerides: 47 mg/dL (ref 0–149)
VLDL Cholesterol Cal: 9 mg/dL (ref 5–40)

## 2016-07-30 LAB — CBC WITH DIFFERENTIAL/PLATELET
BASOS: 0 %
Basophils Absolute: 0 10*3/uL (ref 0.0–0.2)
EOS (ABSOLUTE): 0.1 10*3/uL (ref 0.0–0.4)
EOS: 1 %
Hematocrit: 47.1 % (ref 37.5–51.0)
Hemoglobin: 16 g/dL (ref 13.0–17.7)
IMMATURE GRANULOCYTES: 0 %
Immature Grans (Abs): 0 10*3/uL (ref 0.0–0.1)
Lymphocytes Absolute: 2.1 10*3/uL (ref 0.7–3.1)
Lymphs: 31 %
MCH: 31.6 pg (ref 26.6–33.0)
MCHC: 34 g/dL (ref 31.5–35.7)
MCV: 93 fL (ref 79–97)
MONOS ABS: 0.7 10*3/uL (ref 0.1–0.9)
Monocytes: 11 %
NEUTROS PCT: 57 %
Neutrophils Absolute: 3.9 10*3/uL (ref 1.4–7.0)
PLATELETS: 156 10*3/uL (ref 150–379)
RBC: 5.06 x10E6/uL (ref 4.14–5.80)
RDW: 15.3 % (ref 12.3–15.4)
WBC: 6.8 10*3/uL (ref 3.4–10.8)

## 2016-08-26 ENCOUNTER — Other Ambulatory Visit: Payer: Self-pay | Admitting: Cardiovascular Disease

## 2016-08-26 NOTE — Telephone Encounter (Signed)
Please review for refill, thanks ! 

## 2016-09-16 ENCOUNTER — Ambulatory Visit (INDEPENDENT_AMBULATORY_CARE_PROVIDER_SITE_OTHER)
Admission: RE | Admit: 2016-09-16 | Discharge: 2016-09-16 | Disposition: A | Discharge status: Home / Self Care | Payer: Medicare Other | Source: Ambulatory Visit | Attending: Internal Medicine | Admitting: Internal Medicine

## 2016-09-16 ENCOUNTER — Encounter: Payer: Self-pay | Admitting: Internal Medicine

## 2016-09-16 ENCOUNTER — Ambulatory Visit (INDEPENDENT_AMBULATORY_CARE_PROVIDER_SITE_OTHER): Payer: Medicare Other | Admitting: Internal Medicine

## 2016-09-16 VITALS — BP 128/74 | HR 60 | Temp 97.9°F | Resp 16 | Wt 222.0 lb

## 2016-09-16 DIAGNOSIS — J441 Chronic obstructive pulmonary disease with (acute) exacerbation: Secondary | ICD-10-CM

## 2016-09-16 MED ORDER — PREDNISONE 20 MG PO TABS: 40.0000 mg | ORAL_TABLET | Freq: Every day | ORAL | 0 refills | Status: DC

## 2016-09-16 MED ORDER — HYDROCODONE-HOMATROPINE 5-1.5 MG/5ML PO SYRP: 5.0000 mL | ORAL_SOLUTION | Freq: Every evening | ORAL | 0 refills | Status: DC | PRN

## 2016-09-16 NOTE — Progress Notes (Signed)
Subjective:    Patient ID: Theodore [name], male    DOB: [birth year], 82 y.o.   MRN: [id number]  HPI Here due to respiratory illness  Started with illness ~5 days ago Away with family Started with irritation in throat--but not really sore Now with chest congestion Tried mucinex Due for further travel this weekend  Hard to sleep---due to cough No fever No chills or sweats Slight SOB---but is on inhalers Some wheezing  No ear pain No headache  Current Outpatient Prescriptions on File Prior to Visit  Medication Sig Dispense Refill  . Ascorbic Acid (VITAMIN C PO) Take 1 tablet by mouth daily.     [name] atorvastatin (LIPITOR) 40 MG tablet TAKE 1 TABLET DAILY 90 tablet 3  . Cholecalciferol (VITAMIN D3) 2000 units TABS Take 2,000 Units by mouth daily.    [name] diltiazem (CARTIA XT) 180 MG 24 hr capsule Take 1 capsule (180 mg total) by mouth daily. 90 capsule 3  . ezetimibe (ZETIA) 10 MG tablet TAKE 1 TABLET DAILY 90 tablet 3  . furosemide (LASIX) 20 MG tablet Take 1 tablet (20 mg total) by mouth daily. 90 tablet 3  . losartan (COZAAR) 100 MG tablet Take 1 tablet (100 mg total) by mouth daily. 90 tablet 3  . LYSINE PO Take 1 tablet by mouth daily.     . metoprolol succinate (TOPROL-XL) 100 MG 24 hr tablet TAKE 1 TABLET (100 MG TOTAL) BY MOUTH DAILY. TAKE WITH OR IMMEDIATELY FOLLOWING A MEAL. 30 tablet 0  . metoprolol succinate (TOPROL-XL) 50 MG 24 hr tablet TAKE 1 TABLET (50 MG TOTAL) BY MOUTH DAILY. TAKE WITH OR IMMEDIATELY FOLLOWING A MEAL. 30 tablet 0  . naproxen sodium (ANAPROX) 220 MG tablet Take 220 mg by mouth daily as needed (for aches/pain.).     [name] omeprazole (PRILOSEC) 20 MG capsule Take 20 mg by mouth daily.      . potassium chloride (K-DUR,KLOR-CON) 10 MEQ tablet Take 10 mEq by mouth 2 (two) times daily.    . Tiotropium Bromide Monohydrate (SPIRIVA RESPIMAT) 1.25 MCG/ACT AERS Inhale 2 puffs into the lungs daily. 3 Inhaler 3  . XARELTO 20 MG TABS tablet TAKE 1 TABLET DAILY WITH  SUPPER 90 tablet 1   No current facility-administered medications on file prior to visit.     Allergies  Allergen Reactions  . Penicillins Rash    "Blistering rash" Has patient had a PCN reaction causing immediate rash, facial/tongue/throat swelling, SOB or lightheadedness with hypotension: Yes Has patient had a PCN reaction causing severe rash involving mucus membranes or skin necrosis: No Has patient had a PCN reaction that required hospitalization: No Has patient had a PCN reaction occurring within the last 10 years: No If all of the above answers are "NO", then may proceed with Cephalosporin use.   . Amlodipine     Peripheral edema  . Lisinopril Cough  . Zocor [Simvastatin]     LFT increase with simvastatin and lovastatin   . Tamiflu Rash    Rash,itching    Past Medical History:  Diagnosis Date  . History of arthritis   . History of rheumatic fever   . Hypercholesterolemia   . Hypertension   . Nocturia   . PAF (paroxysmal atrial fibrillation) [facility]) January 2013   Placed on Pradaxa. Did not require cardioversion; spontaneously converted  . Peripheral edema   . Right bundle branch block   . SOB (shortness of breath)   . Tendonitis of elbow, left  Past Surgical History:  Procedure Laterality Date  . BACK SURGERY  12/24/09   fusion C3-C4  . BACK SURGERY  2010   fusion L4-L5  . CARDIAC CATHETERIZATION  2009   NONOBSTRUCTIVE ATHERSCLEROTIC CORONARY DISEASE AND NORMAL  LV FUNCTION  . CARDIAC CATHETERIZATION N/A 09/08/2014   Procedure: Left Heart Cath and Coronary Angiography;  Surgeon: Belva Crome, MD;  Location: [place] CV LAB;  Service: Cardiovascular;  Laterality: N/A;  . CARDIAC CATHETERIZATION N/A 09/28/2015   Procedure: Left Heart Cath and Coronary Angiography;  Surgeon: Leonie Man, MD;  Location: [place] CV LAB;  Service: Cardiovascular;  Laterality: N/A;  . CARDIAC CATHETERIZATION N/A 09/28/2015   Procedure: Intravascular Pressure Wire/FFR  Study;  Surgeon: Leonie Man, MD;  Location: [place] CV LAB;  Service: Cardiovascular;  Laterality: N/A;  . CARDIOVERSION N/A 10/23/2015   Procedure: CARDIOVERSION;  Surgeon: [name], MD;  Location: [facility] ENDOSCOPY;  Service: Cardiovascular;  Laterality: N/A;  . COLONOSCOPY  2013   per patient, rpt 5 yrs  . ELECTROPHYSIOLOGIC STUDY N/A 12/19/2015   Procedure: Atrial Fibrillation Ablation;  Surgeon: [name], MD;  Location: [place] CV LAB;  Service: Cardiovascular;  Laterality: N/A;  . KNEE SURGERY Right 2007  . KNEE SURGERY Left 03/2016   Dr. [name]  . PATELLAR TENDON REPAIR Left 2008  . TRICEPS TENDON REPAIR Left 2013    Family History  Problem Relation Age of Onset  . Heart attack Father   . COPD Father   . Hypertension Father   . Heart disease Brother   . COPD Brother   . Asthma Sister   . Stroke Neg Hx     Social History   Social History  . Marital status: Married    Spouse name: N/A  . Number of children: N/A  . Years of education: N/A   Occupational History  . retired Retired    Art gallery manager   Social History Main Topics  . Smoking status: Never Smoker  . Smokeless tobacco: Never Used  . Alcohol use 1.2 oz/week    2 Cans of beer per week     Comment: 2 beers daily  . Drug use: No  . Sexual activity: No   Other Topics Concern  . Not on file   Social History Narrative   Lives with wife, no pets   Retired   Occ: Art gallery manager   Edu: master's   Activity: golf   Diet: good water, fruits/vegetables daily   Review of Systems  No rash Fatigue feeling No vomiting or diarrhea Appetite is okay     Objective:   Physical Exam  Constitutional: He appears well-nourished. No distress.  HENT:  Mouth/Throat: Oropharynx is clear and moist. No oropharyngeal exudate.  No sinus tenderness Moderate nasal inflammation TMs normal   Neck: No thyromegaly present.  Pulmonary/Chest:  Fair air movement but generalized expiratory  wheezing and rhonchi No crackles Only slight tightness  Lymphadenopathy:    He has no cervical adenopathy.          Assessment & Plan:

## 2016-09-16 NOTE — Assessment & Plan Note (Addendum)
Likely viral URI but clear exacerbation Will check CXR due to lung findings CXR looks fine---actually lingula is clearer than 2016----will await overread Discussed viral etiology---hold on antibiotic Will give prednisone burst and cough syrup

## 2016-10-02 ENCOUNTER — Other Ambulatory Visit: Payer: Self-pay | Admitting: Cardiovascular Disease

## 2016-10-02 NOTE — Telephone Encounter (Signed)
Refill Request.  

## 2016-10-15 ENCOUNTER — Encounter (HOSPITAL_COMMUNITY): Payer: Self-pay | Admitting: Emergency Medicine

## 2016-10-15 ENCOUNTER — Emergency Department (HOSPITAL_COMMUNITY): Payer: Medicare Other

## 2016-10-15 DIAGNOSIS — R079 Chest pain, unspecified: Secondary | ICD-10-CM | POA: Insufficient documentation

## 2016-10-15 DIAGNOSIS — Z5321 Procedure and treatment not carried out due to patient leaving prior to being seen by health care provider: Secondary | ICD-10-CM | POA: Insufficient documentation

## 2016-10-15 DIAGNOSIS — R6884 Jaw pain: Secondary | ICD-10-CM | POA: Diagnosis not present

## 2016-10-15 DIAGNOSIS — M549 Dorsalgia, unspecified: Secondary | ICD-10-CM | POA: Diagnosis not present

## 2016-10-15 LAB — BASIC METABOLIC PANEL
ANION GAP: 9 (ref 5–15)
BUN: 14 mg/dL (ref 6–20)
CALCIUM: 9.3 mg/dL (ref 8.9–10.3)
CO2: 24 mmol/L (ref 22–32)
Chloride: 99 mmol/L — ABNORMAL LOW (ref 101–111)
Creatinine, Ser: 1 mg/dL (ref 0.61–1.24)
GFR calc non Af Amer: >60 mL/min (ref >60)
Glucose, Bld: 101 mg/dL — ABNORMAL HIGH (ref 65–99)
Potassium: 4.1 mmol/L (ref 3.5–5.1)
SODIUM: 132 mmol/L — AB (ref 135–145)

## 2016-10-15 LAB — CBC
HCT: 41.4 % (ref 39.0–52.0)
HEMOGLOBIN: 14.2 g/dL (ref 13.0–17.0)
MCH: 32.2 pg (ref 26.0–34.0)
MCHC: 34.3 g/dL (ref 30.0–36.0)
MCV: 93.9 fL (ref 78.0–100.0)
Platelets: 164 10*3/uL (ref 150–400)
RBC: 4.41 MIL/uL (ref 4.22–5.81)
RDW: 12.2 % (ref 11.5–15.5)
WBC: 7.6 10*3/uL (ref 4.0–10.5)

## 2016-10-15 LAB — I-STAT TROPONIN, ED: TROPONIN I, POC: 0.01 ng/mL (ref 0.00–0.08)

## 2016-10-15 NOTE — ED Triage Notes (Signed)
Pt c/o 3/10 left side cp radiating to his back and jaw, denies any dizziness, nausea or vomiting.

## 2016-10-16 ENCOUNTER — Telehealth: Payer: Self-pay | Admitting: Cardiovascular Disease

## 2016-10-16 ENCOUNTER — Emergency Department (HOSPITAL_COMMUNITY)
Admission: EM | Admit: 2016-10-16 | Discharge: 2016-10-16 | Disposition: A | Discharge status: Home / Self Care | Payer: Medicare Other | Attending: Emergency Medicine | Admitting: Emergency Medicine

## 2016-10-16 NOTE — Telephone Encounter (Signed)
Theodore [name] is calling because he went to the ER on last night and is wanting to speak to you about it. Please call

## 2016-10-16 NOTE — Telephone Encounter (Signed)
Spoke with patient and advised. Patient stated he still continued to feel ok  Scheduled ov for next week  Will call back if any further issues before then

## 2016-10-16 NOTE — Telephone Encounter (Signed)
Please have him track his BP twice daily and follow up next week.

## 2016-10-16 NOTE — Telephone Encounter (Signed)
Returned the call to the patient. He stated that he went to the ED last night with complaints of neck and back discomfort and mild left chest discomfort. His blood pressure at home was "180 something". He stated that he waited four hours in the ED before an xray was completed and lab work. He was told that it would be another 4 hours before he would see a physician so he decided to leave. He would like for Dr. [name] to know that he was in the ED and for her recommendations on whether or not he needs to be seen earlier than December. He did state that he feels much better this morning and his blood pressure was 130/80. Will route to the provider for her knowledge and recommendation.

## 2016-10-16 NOTE — ED Notes (Signed)
Patient states he is going to follow up with his doctor in the morning. He states he is feeling better than when he first came in and feels okay to wait to see his doctor. Patient ambulatory with steady gait, NAD noted. Resp e/u, accompanied by his son.

## 2016-10-23 ENCOUNTER — Ambulatory Visit (INDEPENDENT_AMBULATORY_CARE_PROVIDER_SITE_OTHER): Payer: Medicare Other | Admitting: Cardiovascular Disease

## 2016-10-23 ENCOUNTER — Encounter: Payer: Self-pay | Admitting: Cardiovascular Disease

## 2016-10-23 VITALS — BP 140/78 | HR 63 | Ht 72.0 in | Wt 221.0 lb

## 2016-10-23 DIAGNOSIS — E7800 Pure hypercholesterolemia, unspecified: Secondary | ICD-10-CM | POA: Diagnosis not present

## 2016-10-23 DIAGNOSIS — I2510 Atherosclerotic heart disease of native coronary artery without angina pectoris: Secondary | ICD-10-CM | POA: Diagnosis not present

## 2016-10-23 DIAGNOSIS — I5042 Chronic combined systolic (congestive) and diastolic (congestive) heart failure: Secondary | ICD-10-CM | POA: Diagnosis not present

## 2016-10-23 DIAGNOSIS — I10 Essential (primary) hypertension: Secondary | ICD-10-CM | POA: Diagnosis not present

## 2016-10-23 DIAGNOSIS — I480 Paroxysmal atrial fibrillation: Secondary | ICD-10-CM

## 2016-10-23 MED ORDER — CARVEDILOL 12.5 MG PO TABS: 12.5000 mg | ORAL_TABLET | Freq: Two times a day (BID) | ORAL | 5 refills | Status: DC

## 2016-10-23 NOTE — Patient Instructions (Signed)
Medication Instructions:  STOP METOPROLOL   START CARVEDILOL 12.5 MG TWICE A DAY CONTINUE TO MONITOR YOUR BLOOD PRESSURE AT HOME. IF BLOOD PRESSURE NOT BELOW 130/80 INCREASE YOUR CARVEDILOL TO 25 MG TWICE A DAY   Labwork: NONE  Testing/Procedures: NONE  Follow-Up: Your physician recommends that you schedule a follow-up appointment in: [place]   Your physician wants you to follow-up in: [place] will receive a reminder letter in the mail two months in advance. If you don't receive a letter, please call our office to schedule the follow-up appointment  If you need a refill on your cardiac medications before your next appointment, please call your pharmacy.

## 2016-10-23 NOTE — Progress Notes (Signed)
Cardiology Office Note   Date:  10/23/2016   ID:  Theodore [name], DOB 1934/05/15, MRN [id number]  PCP:  [name], MD  Cardiologist:   Skeet Latch, MD   Chief Complaint  Patient presents with  . Follow-up    Pt states no Sx.      History of Present Illness: Theodore [name] is a 82 y.o. male with chronic systolic and diastolic heart failure, hypertension, hyperlipidemia, paroxysmal atrial fibrillation, asthma and Rheumatic fever who presents for follow up.  [name] was previously a patient of Dr. Mare Ferrari.  [name] was diagnosed with atrial fibrillation in 2014.  He was initially on Pradaxa and switched to Xarelto.  He had an episode of chest pain 08/2014 and had a Lexiscan Myoview 08/31/14 that showed an old inferior scar with peri-infarct ischemia and LVEF 49%.  He then had a LHC that revealed 70% ostial LAD, 50% prox LAD, 30% OM1 and 35% RCA.  He was managed medically.  He subsequently had an echo 09/2014 that showed LVEF 55-60% with mild MR and AR and grade 1 diastolic dysfunction.  At that time atorvastatin was increased to 80 mg but he noted leg pain.  He was seen in the lipid clinic and started back on atorvastatin 40mg  and zetia 10 mg.    [name] was seen in clinic 09/25/15 and reported significant shortness of breath with minimal exertion.  He had a LHC with Dr. Ellyn Hack on 09/28/15 that again revealed a 70% ostial-proximal LAD lesion.  Dr. Ellyn Hack performed FFR on that lesion and there was no significant change.  The left ventriculogram performed at that time revealed LVEF 35-45%.  Mr. Thieme continued to have exertional dyspnea so he was referred for [facility] 10/17/15 that was negative for ischemia. He subsequently underwent DCCV on 10/23/15.  He underwent ablation on 12/19/15 with Dr. [name].  At his last appointment [name] remained in sinus rhythm but did continue to report some shortness of breath.  Last week noted jolting pain into his L arm.  Later that night  he had aching in his jawnd then into his back.  He checked his BP and it was 180/80.  He presented to the ED where his BP reduced to 144/92.  His discomfort went away when his BP improved.  Since then he has been tracking his BP and it has ranged from 115-150/60s-70s.  He has not experienced any more chest pain, arm pain, jaw pain, or shortness of breath. He denies any lower extremity edema, orthopnea, or PND. He continues to play golf and has no exertional symptoms.    Past Medical History:  Diagnosis Date  . History of arthritis   . History of rheumatic fever   . Hypercholesterolemia   . Hypertension   . Nocturia   . PAF (paroxysmal atrial fibrillation) [name]) January 2013   Placed on Pradaxa. Did not require cardioversion; spontaneously converted  . Peripheral edema   . Right bundle branch block   . SOB (shortness of breath)   . Tendonitis of elbow, left     Past Surgical History:  Procedure Laterality Date  . BACK SURGERY  12/24/09   fusion C3-C4  . BACK SURGERY  2010   fusion L4-L5  . CARDIAC CATHETERIZATION  2009   NONOBSTRUCTIVE ATHERSCLEROTIC CORONARY DISEASE AND NORMAL  LV FUNCTION  . CARDIAC CATHETERIZATION N/A 09/08/2014   Procedure: Left Heart Cath and Coronary Angiography;  Surgeon: Belva Crome, MD;  Location: [facility]  INVASIVE CV LAB;  Service: Cardiovascular;  Laterality: N/A;  . CARDIAC CATHETERIZATION N/A 09/28/2015   Procedure: Left Heart Cath and Coronary Angiography;  Surgeon: Leonie Man, MD;  Location: [place] CV LAB;  Service: Cardiovascular;  Laterality: N/A;  . CARDIAC CATHETERIZATION N/A 09/28/2015   Procedure: Intravascular Pressure Wire/FFR Study;  Surgeon: Leonie Man, MD;  Location: [place] CV LAB;  Service: Cardiovascular;  Laterality: N/A;  . CARDIOVERSION N/A 10/23/2015   Procedure: CARDIOVERSION;  Surgeon: [name], MD;  Location: [facility] ENDOSCOPY;  Service: Cardiovascular;  Laterality: N/A;  . COLONOSCOPY  2013   per patient, rpt 5 yrs    . ELECTROPHYSIOLOGIC STUDY N/A 12/19/2015   Procedure: Atrial Fibrillation Ablation;  Surgeon: [name], MD;  Location: [place] CV LAB;  Service: Cardiovascular;  Laterality: N/A;  . KNEE SURGERY Right 2007  . KNEE SURGERY Left 03/2016   Dr. [name]  . PATELLAR TENDON REPAIR Left 2008  . TRICEPS TENDON REPAIR Left 2013     Current Outpatient Prescriptions  Medication Sig Dispense Refill  . Ascorbic Acid (VITAMIN C PO) Take 1 tablet by mouth daily.     [name] atorvastatin (LIPITOR) 40 MG tablet TAKE 1 TABLET DAILY 90 tablet 3  . Cholecalciferol (VITAMIN D3) 2000 units TABS Take 2,000 Units by mouth daily.    [name] diltiazem (CARTIA XT) 180 MG 24 hr capsule Take 1 capsule (180 mg total) by mouth daily. 90 capsule 3  . ezetimibe (ZETIA) 10 MG tablet TAKE 1 TABLET DAILY 90 tablet 3  . furosemide (LASIX) 20 MG tablet Take 1 tablet (20 mg total) by mouth daily. 90 tablet 3  . HYDROcodone-homatropine (HYCODAN) 5-1.5 MG/5ML syrup Take 5 mLs by mouth at bedtime as needed for cough. 120 mL 0  . losartan (COZAAR) 100 MG tablet Take 1 tablet (100 mg total) by mouth daily. 90 tablet 3  . LYSINE PO Take 1 tablet by mouth daily.     . metoprolol succinate (TOPROL-XL) 50 MG 24 hr tablet TAKE 1 TABLET (50 MG TOTAL) BY MOUTH DAILY. TAKE WITH OR IMMEDIATELY FOLLOWING A MEAL. 30 tablet 0  . naproxen sodium (ANAPROX) 220 MG tablet Take 220 mg by mouth daily as needed (for aches/pain.).     [name] omeprazole (PRILOSEC) 20 MG capsule Take 20 mg by mouth daily.      . potassium chloride (K-DUR,KLOR-CON) 10 MEQ tablet Take 10 mEq by mouth 2 (two) times daily.    . potassium chloride (K-DUR,KLOR-CON) 10 MEQ tablet TAKE 1 TABLET TWICE A DAY 180 tablet 3  . predniSONE (DELTASONE) 20 MG tablet Take 2 tablets (40 mg total) by mouth daily. For 3 days, then 1 tab daily for 3 days 9 tablet 0  . Tiotropium Bromide Monohydrate (SPIRIVA RESPIMAT) 1.25 MCG/ACT AERS Inhale 2 puffs into the lungs daily. 3 Inhaler 3  . XARELTO  20 MG TABS tablet TAKE 1 TABLET DAILY WITH SUPPER 90 tablet 1  . carvedilol (COREG) 12.5 MG tablet Take 1 tablet (12.5 mg total) by mouth 2 (two) times daily. 60 tablet 5   No current facility-administered medications for this visit.     Allergies:   Penicillins; Amlodipine; Lisinopril; Zocor [simvastatin]; and Tamiflu    Social History:  The patient  reports that he has never smoked. He has never used smokeless tobacco. He reports that he drinks about 1.2 oz of alcohol per week . He reports that he does not use drugs.   Family History:  The  patient's family history includes Asthma in his sister; COPD in his brother and father; Heart attack in his father; Heart disease in his brother; Hypertension in his father.    ROS:  Please see the history of present illness.   Otherwise, review of systems are positive for none.   All other systems are reviewed and negative.    PHYSICAL EXAM: VS:  BP 140/78   Pulse 63   Ht 6' (1.829 m)   Wt 100.2 kg (221 lb)   BMI 29.97 kg/m  , BMI Body mass index is 29.97 kg/m. GENERAL:  Well appearing HEENT: Pupils equal round and reactive, fundi not visualized, oral mucosa unremarkable NECK:  No jugular venous distention, waveform within normal limits, carotid upstroke brisk and symmetric, no bruits, no thyromegaly LUNGS:  Clear to auscultation bilaterally HEART:  RRR.  PMI not displaced or sustained,S1 and S2 within normal limits, no S3, no S4, no clicks, no rubs, no murmurs ABD:  Flat, positive bowel sounds normal in frequency in pitch, no bruits, no rebound, no guarding, no midline pulsatile mass, no hepatomegaly, no splenomegaly EXT:  2 plus pulses throughout, no edema, no cyanosis no clubbing SKIN:  No rashes no nodules NEURO:  Cranial nerves II through XII grossly intact, motor grossly intact throughout PSYCH:  Cognitively intact, oriented to person place and time   EKG:  EKG is not ordered today. 10/30/15: Sinus rhythm.  Rate 60 bpm.  RBBB.  LAD.   The ekg ordered 09/25/15 demonstrates atrial fibrillation.  Rate 84 bpm.   Lexiscan Myoview 10/17/15:  The left ventricular ejection fraction is moderately decreased (30-44%).  Nuclear stress EF: 39%.  There was no ST segment deviation noted during stress.  There is a small defect of moderate severity present in the apical lateral and apex location and medium sized defect of moderate severity in the basal inferior, mild inferior and apical inferior location. These defects are fixed and consistent with scar. No ischemia noted.  This is an intermediate risk study due to LV dysfunction.   LHC 09/28/15:  Ostial to proximal LAD stenoses of this at least moderately calcified. 70% focal stenosis followed by diffuse 50% stenosis.  FFR evaluation of the combination of the LAD lesions showed no significant drop in baseline FFR from 0.96 down to 0.95. This suggests physiologically not significant lesion.  There is moderate left ventricular systolic dysfunction. The left ventricular ejection fraction is 35-45% by visual estimate.  LV end diastolic pressure is moderately elevated.  LHC 09/08/14:   1. 1st Mrg lesion, 30% stenosed. 2. Prox LAD lesion, 50% stenosed. 3. Ost LAD lesion, 70% stenosed. 4. Prox RCA to Dist RCA lesion, 35% stenosed.   50-70% ostial/proximal LAD stenosis within the heavily calcified segment. Recent myocardial perfusion energy and within the past week did not demonstrate anterior ischemia.  Heavily calcified but widely patent circumflex and right coronary with luminal irregularities noted in the proximal mid and distal segments.  Mild global left ventricular dysfunction with an ejection fraction in the 45-50% range   Recommendations:   The patient's symptoms are mixed. With exertion he experiences dyspnea. At rest and randomly he has neck jaw and left elbow discomfort. When both the angiogram and the myocardial perfusion study were considered together, no  justification for percutaneous intervention on the LAD could be made.  Close clinical follow-up  Aggressive risk factor modification    Recent Labs: 07/29/2016: ALT 24 10/15/2016: BUN 14; Creatinine, Ser 1.00; Hemoglobin 14.2; Platelets 164; Potassium 4.1; Sodium  132    Lipid Panel    Component Value Date/Time   CHOL 121 07/29/2016 0822   TRIG 47 07/29/2016 0822   HDL 56 07/29/2016 0822   CHOLHDL 2.2 07/29/2016 0822   CHOLHDL 2 01/31/2016 0850   VLDL 12.4 01/31/2016 0850   LDLCALC 56 07/29/2016 0822      Wt Readings from Last 3 Encounters:  10/23/16 100.2 kg (221 lb)  10/15/16 99.8 kg (220 lb)  09/16/16 100.7 kg (222 lb)      ASSESSMENT AND PLAN:  # Paroxysmal atrial fibrillation: No recurrent episode since his ablation 12/2015. We will switch metoprolol to carvedilol for improved blood pressure control. Continue diltiazem and Xarelto.   This patients CHA2DS2-VASc Score and unadjusted Ischemic Stroke Rate (% per year) is equal to 7.2 % stroke rate/year from a score of 5  Above score calculated as 1 point each if present [CHF, HTN, DM, Vascular=MI/PAD/Aortic Plaque, Age if 65-74, or Male] Above score calculated as 2 points each if present [Age > 75, or Stroke/TIA/TE]   # CAD:  Mr. Barreiro has a known, 70% ostial LAD lesion. He is asymptomatic. He is not on aspirin or P2Y12 ihnibitor because he is on Xarelto. Continue atorvastatin, Zetia and start carvedilol as above.  # Chronic systolic and diastolic heart failure: # Hypertension:  LVEF on LHC was 35-45% and 39% on Lexiscan Myoview.  He remains euvolemic on exam.  BP is poorly-controlled.  We will switch metoprolol to carvedilol 12.5 mg twice daily. His blood pressure remains greater than 130/80 he can increase this to 25 mg twice daily.  He did not tolerate spironolactone due to hyperkalemia. Continue furosemide and losartan.  # Hyperlipidemia: LDL 56 on 07/2016.   Continue atorvastatin and Zetia.    Current medicines  are reviewed at length with the patient today.  The patient does not have concerns regarding medicines.  The following changes have been made:  None  Labs/ tests ordered today include:   No orders of the defined types were placed in this encounter.    Disposition:   FU with Reneka Nebergall C. [name], MD, [facility] in 4 months.  PharmD in 2 weeks.    This note was written with the assistance of speech recognition software.  Please excuse any transcriptional errors.  Signed, Kaleea Penner C. [name], MD, [facility]  10/23/2016 8:41 AM    [place]

## 2016-11-06 ENCOUNTER — Ambulatory Visit (INDEPENDENT_AMBULATORY_CARE_PROVIDER_SITE_OTHER): Payer: Medicare Other | Admitting: Pharmacist

## 2016-11-06 VITALS — BP 132/84 | HR 62

## 2016-11-06 DIAGNOSIS — I10 Essential (primary) hypertension: Secondary | ICD-10-CM

## 2016-11-06 MED ORDER — METOPROLOL SUCCINATE ER 50 MG PO TB24: 100.0000 mg | ORAL_TABLET | Freq: Two times a day (BID) | ORAL | 0 refills | Status: DC

## 2016-11-06 NOTE — Assessment & Plan Note (Signed)
Blood pressure is well controlled today and at home but he was unable to tolerate carvedilol after rapid titration. Will increase metoprolol succinate from 150mg  to 200mg  daily. Metoprolol dose will be divided in 100mg  in the morning and 100mg  in the evening.  Patient to continue blood pressure monitoring at home. Plan to follow up with patient in 1 week, then in 3 weeks ta the clinic.

## 2016-11-06 NOTE — Progress Notes (Signed)
Patient ID: Theodore [name]                 DOB: 07/31/1934                      MRN: [id number]     HPI: Theodore [name] is a 82 y.o. male referred by Dr. [name] to HTN clinic. PMH includes atrial fibrillation, CAD, hypertension, hyperlipidemia, asthma and rheumatic fever.  Patient had an episode of elevated BP with systolic BP ~201 and severe headache, after few hours the elevated BP resolved without additional therapy needed, but patient was very concerns about it. During recent OV with DR [name] his metoprolol succinate 150mg  was changed to carvedilol 12.5mg  twice daily to improve blood pressure control with additional recommendation to increase dose to 25mg  daily if needed. Patient self-adjusted carvedilol to 25mg  twice daily after only 4 days on carvedilol 12.5mg  and experienced aches, severe fatigue and sweats; therefore, he decreased carvedilol to12.5mg  twice daily after only 3 days on 25mg  dose. After 10 days on carvedilol he STOPPED carvedilol completely and resumed metoprolol succinate 150mg  every morning.  Patient presents today to HTN clinic for follow up.  He checks his blood pressure three times per day and is very anxious and frustrated about variable BP readings and the need to change metoprolol therapy after so many years of stability.  Best BP reading were obtained with carvedilol 25mg  but patient unwilling to re-try at this time.   Current HTN meds: Metoprolol succinate 150mg  every morning Diltiazem 180mg  daily Losartan 100mg  daily  BP goal: 130/80  Family History: Asthma in his sister; COPD in his brother and father; Heart attack in his father; Heart disease in his brother; Hypertension in his father.   Social History: The patient  reports that he has never smoked. He has never used smokeless tobacco. He reports that he drinks about 1.2 oz of alcohol per week . He reports that he does not use drugs.   Diet: low sodium  Home BP readings:  13 morning readings; average  134/70 (pulse 58 - 71 bpm) 13 afternoon reading; average 136/72 (pulse 56 - 68 bpm) 13 evening readings; average 137/74 (pulse 58 - 71 bpm)  Wt Readings from Last 3 Encounters:  10/23/16 221 lb (100.2 kg)  10/15/16 220 lb (99.8 kg)  09/16/16 222 lb (100.7 kg)   BP Readings from Last 3 Encounters:  11/06/16 132/84  10/23/16 140/78  10/16/16 (!) 143/92   Pulse Readings from Last 3 Encounters:  11/06/16 62  10/23/16 63  10/16/16 61    Renal function: CrCl cannot be calculated (Patient's most recent lab result is older than the maximum 21 days allowed.).  Past Medical History:  Diagnosis Date  . History of arthritis   . History of rheumatic fever   . Hypercholesterolemia   . Hypertension   . Nocturia   . PAF (paroxysmal atrial fibrillation) [facility]) January 2013   Placed on Pradaxa. Did not require cardioversion; spontaneously converted  . Peripheral edema   . Right bundle branch block   . SOB (shortness of breath)   . Tendonitis of elbow, left     Current Outpatient Prescriptions on File Prior to Visit  Medication Sig Dispense Refill  . Ascorbic Acid (VITAMIN C PO) Take 1 tablet by mouth daily.     [name] atorvastatin (LIPITOR) 40 MG tablet TAKE 1 TABLET DAILY 90 tablet 3  . Cholecalciferol (VITAMIN D3) 2000 units TABS Take 2,000 Units by  mouth daily.    [name] diltiazem (CARTIA XT) 180 MG 24 hr capsule Take 1 capsule (180 mg total) by mouth daily. 90 capsule 3  . ezetimibe (ZETIA) 10 MG tablet TAKE 1 TABLET DAILY 90 tablet 3  . furosemide (LASIX) 20 MG tablet Take 1 tablet (20 mg total) by mouth daily. 90 tablet 3  . HYDROcodone-homatropine (HYCODAN) 5-1.5 MG/5ML syrup Take 5 mLs by mouth at bedtime as needed for cough. 120 mL 0  . losartan (COZAAR) 100 MG tablet Take 1 tablet (100 mg total) by mouth daily. 90 tablet 3  . LYSINE PO Take 1 tablet by mouth daily.     . naproxen sodium (ANAPROX) 220 MG tablet Take 220 mg by mouth daily as needed (for aches/pain.).     [name] omeprazole  (PRILOSEC) 20 MG capsule Take 20 mg by mouth daily.      . potassium chloride (K-DUR,KLOR-CON) 10 MEQ tablet TAKE 1 TABLET TWICE A DAY 180 tablet 3  . predniSONE (DELTASONE) 20 MG tablet Take 2 tablets (40 mg total) by mouth daily. For 3 days, then 1 tab daily for 3 days 9 tablet 0  . Tiotropium Bromide Monohydrate (SPIRIVA RESPIMAT) 1.25 MCG/ACT AERS Inhale 2 puffs into the lungs daily. 3 Inhaler 3  . XARELTO 20 MG TABS tablet TAKE 1 TABLET DAILY WITH SUPPER 90 tablet 1   No current facility-administered medications on file prior to visit.     Allergies  Allergen Reactions  . Penicillins Rash    "Blistering rash" Has patient had a PCN reaction causing immediate rash, facial/tongue/throat swelling, SOB or lightheadedness with hypotension: Yes Has patient had a PCN reaction causing severe rash involving mucus membranes or skin necrosis: No Has patient had a PCN reaction that required hospitalization: No Has patient had a PCN reaction occurring within the last 10 years: No If all of the above answers are "NO", then may proceed with Cephalosporin use.   . Amlodipine     Peripheral edema  . Lisinopril Cough  . Zocor [Simvastatin]     LFT increase with simvastatin and lovastatin   . Tamiflu Rash    Rash,itching    Blood pressure 132/84, pulse 62.  Essential hypertension Blood pressure is well controlled today and at home but he was unable to tolerate carvedilol after rapid titration. Will increase metoprolol succinate from 150mg  to 200mg  daily. Metoprolol dose will be divided in 100mg  in the morning and 100mg  in the evening.  Patient to continue blood pressure monitoring at home. Plan to follow up with patient in 1 week, then in 3 weeks ta the clinic.   Vaani Morren Rodriguez-Guzman PharmD, BCPS, [place] [id number] 11/06/2016 8:33 PM

## 2016-11-06 NOTE — Patient Instructions (Signed)
Return for a a follow up appointment in 1 week by phone , then 3 week at the HTN clinic  Your blood pressure today is  132/84 pulse 62  Check your blood pressure at home daily (if able) and keep record of the readings.  Take your BP meds as follows: STOP taking carvedilol  INCREASE metoprolol to 100mg  in the morning and 100mg  in the evening  Continue all other medication as prescribed  Bring your BP cuff and your record of home blood pressures to your next appointment.  Exercise as you're able, try to walk approximately 30 minutes per day.  Keep salt intake to a minimum, especially watch canned and prepared boxed foods.  Eat more fresh fruits and vegetables and fewer canned items.  Avoid eating in fast food restaurants.    HOW TO TAKE YOUR BLOOD PRESSURE: . Rest 5 minutes before taking your blood pressure. .  Don't smoke or drink caffeinated beverages for at least 30 minutes before. . Take your blood pressure before (not after) you eat. . Sit comfortably with your back supported and both feet on the floor (don't cross your legs). . Elevate your arm to heart level on a table or a desk. . Use the proper sized cuff. It should fit smoothly and snugly around your bare upper arm. There should be enough room to slip a fingertip under the cuff. The bottom edge of the cuff should be 1 inch above the crease of the elbow. . Ideally, take 3 measurements at one sitting and record the average.

## 2016-11-12 ENCOUNTER — Telehealth: Payer: Self-pay | Admitting: Pharmacist

## 2016-11-12 MED ORDER — METOPROLOL SUCCINATE ER 100 MG PO TB24: 100.0000 mg | ORAL_TABLET | Freq: Two times a day (BID) | ORAL | 1 refills | Status: DC

## 2016-11-12 NOTE — Telephone Encounter (Signed)
BP and HR back to goal. Patient feeling well and had good energy level.   90 day supply for metoprolol 100mg  [facility]  F/U with DR [name] already scheduled.

## 2016-11-13 ENCOUNTER — Other Ambulatory Visit: Payer: Self-pay | Admitting: Cardiovascular Disease

## 2016-11-13 NOTE — Telephone Encounter (Signed)
Refill Request.  

## 2016-11-14 ENCOUNTER — Other Ambulatory Visit: Payer: Self-pay | Admitting: Cardiovascular Disease

## 2016-11-14 NOTE — Telephone Encounter (Signed)
Rx(s) sent to pharmacy electronically.  

## 2016-11-14 NOTE — Telephone Encounter (Signed)
Please review for refill, thanks ! 

## 2016-12-04 ENCOUNTER — Other Ambulatory Visit: Payer: Self-pay | Admitting: Cardiovascular Disease

## 2016-12-04 NOTE — Telephone Encounter (Signed)
Please review for refill. Thanks!  

## 2016-12-09 DIAGNOSIS — Z8679 Personal history of other diseases of the circulatory system: Secondary | ICD-10-CM | POA: Insufficient documentation

## 2016-12-09 DIAGNOSIS — Z8739 Personal history of other diseases of the musculoskeletal system and connective tissue: Secondary | ICD-10-CM | POA: Insufficient documentation

## 2016-12-09 DIAGNOSIS — M778 Other enthesopathies, not elsewhere classified: Secondary | ICD-10-CM | POA: Insufficient documentation

## 2016-12-09 DIAGNOSIS — R609 Edema, unspecified: Secondary | ICD-10-CM | POA: Insufficient documentation

## 2016-12-24 ENCOUNTER — Ambulatory Visit (INDEPENDENT_AMBULATORY_CARE_PROVIDER_SITE_OTHER): Payer: Medicare Other | Admitting: Cardiology

## 2016-12-24 ENCOUNTER — Encounter: Payer: Self-pay | Admitting: Cardiology

## 2016-12-24 VITALS — BP 134/80 | HR 64 | Ht 72.0 in | Wt 219.6 lb

## 2016-12-24 DIAGNOSIS — I77819 Aortic ectasia, unspecified site: Secondary | ICD-10-CM

## 2016-12-24 DIAGNOSIS — I10 Essential (primary) hypertension: Secondary | ICD-10-CM

## 2016-12-24 DIAGNOSIS — I481 Persistent atrial fibrillation: Secondary | ICD-10-CM | POA: Diagnosis not present

## 2016-12-24 DIAGNOSIS — E785 Hyperlipidemia, unspecified: Secondary | ICD-10-CM | POA: Diagnosis not present

## 2016-12-24 DIAGNOSIS — I2510 Atherosclerotic heart disease of native coronary artery without angina pectoris: Secondary | ICD-10-CM | POA: Diagnosis not present

## 2016-12-24 DIAGNOSIS — I428 Other cardiomyopathies: Secondary | ICD-10-CM | POA: Diagnosis not present

## 2016-12-24 DIAGNOSIS — I4819 Other persistent atrial fibrillation: Secondary | ICD-10-CM

## 2016-12-24 NOTE — Progress Notes (Signed)
Electrophysiology Office Note   Date:  12/24/2016   ID:  Theodore [name], DOB August 17, 1934, MRN [id number]  PCP:  Theodore Bush, MD  Cardiologist:  [name] Primary Electrophysiologist:  Lounell Schumacher Meredith Leeds, MD    Chief Complaint  Patient presents with  . Follow-up    Persistent Afib     History of Present Illness: Theodore [name] is a 82 y.o. male who presents today for electrophysiology evaluation.   He has a history of hypertension, hyperlipidemia, persistent atrial fibrillation, asthma, and rheumatic fever. [name] was diagnosed with atrial fibrillation in 2014.  He was initially on Pradaxa and switched to Xarelto.  He had an episode of chest pain 08/2014 and had a Lexiscan Myoview 08/31/14 that showed an old inferior scar with peri-infarct ischemia and LVEF 49%.  He then had a LHC that revealed 70% ostial LAD, 50% prox LAD, 30% OM1 and 35% RCA.    Status post atrial fibrillation ablation on 12/19/15.  Today, denies symptoms of palpitations, chest pain, shortness of breath, orthopnea, PND, lower extremity edema, claudication, dizziness, presyncope, syncope, bleeding, or neurologic sequela. The patient is tolerating medications without difficulties.  And feeling well since his ablation.  He has had no issues with chest pain or shortness of breath.  His level of fatigue has improved significantly.  He has not had any symptoms similar to his AF symptoms.  He is planned to get a knee replacement in January.   Past Medical History:  Diagnosis Date  . History of arthritis   . History of rheumatic fever   . Hypercholesterolemia   . Hypertension   . Nocturia   . PAF (paroxysmal atrial fibrillation) [facility]) January 2013   Placed on Pradaxa. Did not require cardioversion; spontaneously converted  . Peripheral edema   . Right bundle branch block   . SOB (shortness of breath)   . Tendonitis of elbow, left    Past Surgical History:  Procedure Laterality Date  . BACK SURGERY  12/24/09     fusion C3-C4  . BACK SURGERY  2010   fusion L4-L5  . CARDIAC CATHETERIZATION  2009   NONOBSTRUCTIVE ATHERSCLEROTIC CORONARY DISEASE AND NORMAL  LV FUNCTION  . COLONOSCOPY  2013   per patient, rpt 5 yrs  . KNEE SURGERY Right 2007  . KNEE SURGERY Left 03/2016   Dr. [name]  . PATELLAR TENDON REPAIR Left 2008  . TRICEPS TENDON REPAIR Left 2013     Current Outpatient Medications  Medication Sig Dispense Refill  . Ascorbic Acid (VITAMIN C PO) Take 1 tablet by mouth daily.     [name] atorvastatin (LIPITOR) 40 MG tablet TAKE 1 TABLET DAILY 90 tablet 3  . Cholecalciferol (VITAMIN D3) 2000 units TABS Take 2,000 Units by mouth daily.    [name] diltiazem (CARTIA XT) 180 MG 24 hr capsule Take 1 capsule (180 mg total) by mouth daily. 90 capsule 3  . ezetimibe (ZETIA) 10 MG tablet TAKE 1 TABLET DAILY 90 tablet 3  . furosemide (LASIX) 20 MG tablet TAKE 1 TABLET DAILY 90 tablet 0  . losartan (COZAAR) 100 MG tablet TAKE 1 TABLET DAILY 90 tablet 3  . LYSINE PO Take 1 tablet by mouth daily.     . metoprolol succinate (TOPROL-XL) 100 MG 24 hr tablet Take 1 tablet (100 mg total) by mouth 2 (two) times daily. Take with or immediately following a meal. 180 tablet 1  . naproxen sodium (ANAPROX) 220 MG tablet Take 220 mg by mouth daily as  needed (for aches/pain.).     [name] omeprazole (PRILOSEC) 20 MG capsule Take 20 mg by mouth daily.      . potassium chloride (K-DUR,KLOR-CON) 10 MEQ tablet TAKE 1 TABLET TWICE A DAY 180 tablet 3  . Tiotropium Bromide Monohydrate (SPIRIVA RESPIMAT) 1.25 MCG/ACT AERS Inhale 2 puffs into the lungs daily. 3 Inhaler 3  . XARELTO 20 MG TABS tablet TAKE 1 TABLET DAILY WITH SUPPER 90 tablet 1   No current facility-administered medications for this visit.     Allergies:   Penicillins; Amlodipine; Lisinopril; Zocor [simvastatin]; and Tamiflu   Social History:  The patient  reports that  has never smoked. he has never used smokeless tobacco. He reports that he drinks about 1.2 oz of alcohol per  week. He reports that he does not use drugs.   Family History:  The patient's family history includes Asthma in his sister; COPD in his brother and father; Heart attack in his father; Heart disease in his brother; Hypertension in his father.    ROS:  Please see the history of present illness.   Otherwise, review of systems is positive for none.   All other systems are reviewed and negative.   PHYSICAL EXAM: VS:  BP 134/80   Pulse 64   Ht 6' (1.829 m)   Wt 219 lb 9.6 oz (99.6 kg)   SpO2 96%   BMI 29.78 kg/m  , BMI Body mass index is 29.78 kg/m. GEN: Well nourished, well developed, in no acute distress  HEENT: normal  Neck: no JVD, carotid bruits, or masses Cardiac: RRR; no murmurs, rubs, or gallops,no edema  Respiratory:  clear to auscultation bilaterally, normal work of breathing GI: soft, nontender, nondistended, + BS MS: no deformity or atrophy  Skin: warm and dry Neuro:  Strength and sensation are intact Psych: euthymic mood, full affect  EKG:  EKG is not ordered today. Personal review of the ekg ordered 10/16/16 shows SR, RBBB, LAFB    Recent Labs: 07/29/2016: ALT 24 10/15/2016: BUN 14; Creatinine, Ser 1.00; Hemoglobin 14.2; Platelets 164; Potassium 4.1; Sodium 132    Lipid Panel     Component Value Date/Time   CHOL 121 07/29/2016 0822   TRIG 47 07/29/2016 0822   HDL 56 07/29/2016 0822   CHOLHDL 2.2 07/29/2016 0822   CHOLHDL 2 01/31/2016 0850   VLDL 12.4 01/31/2016 0850   LDLCALC 56 07/29/2016 0822     Wt Readings from Last 3 Encounters:  12/24/16 219 lb 9.6 oz (99.6 kg)  10/23/16 221 lb (100.2 kg)  10/15/16 220 lb (99.8 kg)      Other studies Reviewed: Additional studies/ records that were reviewed today include: SPECT 10/17/15, TTE 10/09/14  Review of the above records today demonstrates:   The left ventricular ejection fraction is moderately decreased (30-44%).  Nuclear stress EF: 39%.  There was no ST segment deviation noted during stress.  There is a  small defect of moderate severity present in the apical lateral and apex location and medium sized defect of moderate severity in the basal inferior, mild inferior and apical inferior location. These defects are fixed and consistent with scar. No ischemia noted.  This is an intermediate risk study due to LV dysfunction.   - Left ventricle: The cavity size was normal. Wall thickness was   normal. Systolic function was normal. The estimated ejection   fraction was in the range of 55% to 60%. Wall motion was normal;   there were no regional wall motion abnormalities.  Doppler   parameters are consistent with abnormal left ventricular   relaxation (grade 1 diastolic dysfunction). - Aortic valve: There was mild regurgitation. - Mitral valve: There was mild regurgitation. - Left atrium: The atrium was mildly dilated. - Right ventricle: The cavity size was mildly dilated. - Right atrium: The atrium was mildly dilated. - Pulmonary arteries: Systolic pressure was mildly increased. PA   peak pressure: 36 mm Hg (S).  Impressions:  - Normal LV systolic function; grade 1 diastolic dysfunction; mild   biatrial enlargement; mild RVE; calcified aortic valve with mild   AI; mild MR; trace TR with mildly elevated pulmonary pressure.   ASSESSMENT AND PLAN:  1.  Persistent atrial fibrillation: I have ablation 12/19/15.  Currently on metoprolol, Xarelto, and diltiazem.  Tolerating the medications well.  Has had no recurrences.    This patients CHA2DS2-VASc Score and unadjusted Ischemic Stroke Rate (% per year) is equal to 3.2 % stroke rate/year from a score of 3  Above score calculated as 1 point each if present [CHF, HTN, DM, Vascular=MI/PAD/Aortic Plaque, Age if 65-74, or Male] Above score calculated as 2 points each if present [Age > 75, or Stroke/TIA/TE]    2. CAD: No current chest pain.  Continue current management.  3. Chronic systolic heart failure: On Aldactone, metoprolol, and losartan.   Symptoms have significantly improved since ablation.  No changes today.  4. Hypertension: Controlled today  5. Hyperlipidemia: Continue Lipitor  6. Ectatic abdominal aorta: Found by ultrasound 09/30/13.  Per radiology recommendations, repeat ultrasound in 5 years.    Current medicines are reviewed at length with the patient today.   The patient does not have concerns regarding his medicines.  The following changes were made today: None  Labs/ tests ordered today include:  No orders of the defined types were placed in this encounter.    Disposition:   FU with Marisal Swarey 6 months  Signed, Tabathia Knoche Meredith Leeds, MD  12/24/2016 10:32 AM     [facility] [address] [place] [address] [phone] (office) [phone] (fax)

## 2016-12-24 NOTE — Patient Instructions (Signed)
Medication Instructions:    Your physician recommends that you continue on your current medications as directed. Please refer to the Current Medication list given to you today.  - If you need a refill on your cardiac medications before your next appointment, please call your pharmacy.   Labwork:  None ordered  Testing/Procedures:  None ordered  Follow-Up:  Your physician wants you to follow-up in:6 MONTHS with Dr. Camnitz.  You will receive a reminder letter in the mail two months in advance. If you don't receive a letter, please call our office to schedule the follow-up appointment.  Thank you for choosing [facility]!!   Henryk Ursin, RN [phone]        

## 2017-01-02 ENCOUNTER — Other Ambulatory Visit: Payer: Medicare Other

## 2017-01-08 ENCOUNTER — Other Ambulatory Visit: Payer: Self-pay | Admitting: Family Medicine

## 2017-01-08 DIAGNOSIS — D696 Thrombocytopenia, unspecified: Secondary | ICD-10-CM

## 2017-01-08 DIAGNOSIS — E7800 Pure hypercholesterolemia, unspecified: Secondary | ICD-10-CM

## 2017-01-09 ENCOUNTER — Other Ambulatory Visit (INDEPENDENT_AMBULATORY_CARE_PROVIDER_SITE_OTHER): Payer: Medicare Other

## 2017-01-09 DIAGNOSIS — E7800 Pure hypercholesterolemia, unspecified: Secondary | ICD-10-CM | POA: Diagnosis not present

## 2017-01-09 LAB — COMPREHENSIVE METABOLIC PANEL
ALBUMIN: 4.2 g/dL (ref 3.5–5.2)
ALK PHOS: 63 U/L (ref 39–117)
ALT: 21 U/L (ref 0–53)
AST: 19 U/L (ref 0–37)
BILIRUBIN TOTAL: 0.7 mg/dL (ref 0.2–1.2)
BUN: 15 mg/dL (ref 6–23)
CO2: 32 mEq/L (ref 19–32)
Calcium: 9.5 mg/dL (ref 8.4–10.5)
Chloride: 98 mEq/L (ref 96–112)
Creatinine, Ser: 1.04 mg/dL (ref 0.40–1.50)
GFR: 72.97 mL/min (ref >60.00)
GLUCOSE: 98 mg/dL (ref 70–99)
Potassium: 4.4 mEq/L (ref 3.5–5.1)
Sodium: 135 mEq/L (ref 135–145)
TOTAL PROTEIN: 7 g/dL (ref 6.0–8.3)

## 2017-01-09 LAB — LIPID PANEL
CHOLESTEROL: 125 mg/dL (ref 0–200)
HDL: 55.7 mg/dL (ref >39.00)
LDL Cholesterol: 60 mg/dL (ref 0–99)
NONHDL: 69.26
Total CHOL/HDL Ratio: 2
Triglycerides: 44 mg/dL (ref 0.0–149.0)
VLDL: 8.8 mg/dL (ref 0.0–40.0)

## 2017-01-13 ENCOUNTER — Encounter: Payer: Self-pay | Admitting: Family Medicine

## 2017-01-13 ENCOUNTER — Ambulatory Visit (INDEPENDENT_AMBULATORY_CARE_PROVIDER_SITE_OTHER): Payer: Medicare Other | Admitting: Family Medicine

## 2017-01-13 VITALS — BP 122/80 | HR 59 | Temp 97.9°F | Ht 70.0 in | Wt 219.0 lb

## 2017-01-13 DIAGNOSIS — I2510 Atherosclerotic heart disease of native coronary artery without angina pectoris: Secondary | ICD-10-CM | POA: Diagnosis not present

## 2017-01-13 DIAGNOSIS — E663 Overweight: Secondary | ICD-10-CM | POA: Insufficient documentation

## 2017-01-13 DIAGNOSIS — Z0000 Encounter for general adult medical examination without abnormal findings: Secondary | ICD-10-CM | POA: Diagnosis not present

## 2017-01-13 DIAGNOSIS — J449 Chronic obstructive pulmonary disease, unspecified: Secondary | ICD-10-CM

## 2017-01-13 DIAGNOSIS — I10 Essential (primary) hypertension: Secondary | ICD-10-CM

## 2017-01-13 DIAGNOSIS — E669 Obesity, unspecified: Secondary | ICD-10-CM

## 2017-01-13 DIAGNOSIS — M170 Bilateral primary osteoarthritis of knee: Secondary | ICD-10-CM | POA: Diagnosis not present

## 2017-01-13 DIAGNOSIS — Z7189 Other specified counseling: Secondary | ICD-10-CM | POA: Diagnosis not present

## 2017-01-13 DIAGNOSIS — K219 Gastro-esophageal reflux disease without esophagitis: Secondary | ICD-10-CM | POA: Diagnosis not present

## 2017-01-13 DIAGNOSIS — E7800 Pure hypercholesterolemia, unspecified: Secondary | ICD-10-CM

## 2017-01-13 DIAGNOSIS — I480 Paroxysmal atrial fibrillation: Secondary | ICD-10-CM

## 2017-01-13 DIAGNOSIS — Z01818 Encounter for other preprocedural examination: Secondary | ICD-10-CM

## 2017-01-13 NOTE — Assessment & Plan Note (Signed)
Chronic, stable. Continue zetia and lipitor.  The ASCVD Risk score (Goff DC Jr., et al., 2013) failed to calculate for the following reasons:   The 2013 ASCVD risk score is only valid for ages 40 to 79  

## 2017-01-13 NOTE — Assessment & Plan Note (Signed)
Chronic, stable on omeprazole 20mg daily.  

## 2017-01-13 NOTE — Assessment & Plan Note (Signed)
S/p cardioversion and ablation and symptoms well controlled since then. Pending cardiac clearance for upcoming surgery.

## 2017-01-13 NOTE — Assessment & Plan Note (Signed)
Encouraged healthy diet and lifestyle changes to affect sustainable weight loss. Activity limiting osteoarthritis.

## 2017-01-13 NOTE — Assessment & Plan Note (Addendum)
Will see cards for cardiac clearance. Medical clearance performed today. Adequately low risk to proceed with orthopedic surgery.  Has tolerated GETA well in the past.  Latest CXR reviewed - some question of fibrosis vs atx LLL which pt states is chronic and stable. Denies dyspnea, cough, demonstrates normal O2 sat today. COPD very well controlled.

## 2017-01-13 NOTE — Assessment & Plan Note (Signed)
Upcoming L knee replacement Theodore [name])

## 2017-01-13 NOTE — Assessment & Plan Note (Signed)
Followed by pulm, doing well with slow titration of spiriva - down to 1 puff at this time.

## 2017-01-13 NOTE — Assessment & Plan Note (Signed)
Preventative protocols reviewed and updated unless pt declined. Discussed healthy diet and lifestyle.  

## 2017-01-13 NOTE — Assessment & Plan Note (Signed)
Will see cards for cardiac clearance.

## 2017-01-13 NOTE — Progress Notes (Signed)
BP 122/80 (BP Location: Left Arm, Patient Position: Sitting, Cuff Size: Normal)   Pulse (!) 59   Temp 97.9 F (36.6 C) (Oral)   Ht 5\' 10"  (1.778 m)   Wt 219 lb (99.3 kg)   SpO2 97%   BMI 31.42 kg/m    CC: medicare wellness visit, preop evaluation Subjective:    Patient ID: Theodore [name], male    DOB: Jun 30, 1934, 82 y.o.   MRN: [id number]  HPI: Theodore [name] is a 81 y.o. male presenting on 01/13/2017 for Medicare Wellness and Pre-op Exam (Having knee replacement surgery 02/09/18. Has form to be completed)   Saw Katha Cabal 07/2016 for medicare wellness visit. Note reviewed.    H/o cardioversion and ablation for afib 12/2015.  Pending L knee replacement 02/09/2017 [name]), would like preop evaluation prior to surgery. Sees Dr Annamaria Boots annually (April) for COPD in non smoker, continues spiriva, but trying to taper off and doing well (down to 1 puff/day). Latest xray showed mild atx/fibrosis LLL and per patient this is stable.   S/p R knee replacement. Has had several prior surgeries and tolerated GETA. Knee pain is activity limiting. Able to walk up a flight of stairs without dyspnea.   Preventative: COLONOSCOPY 2013 per patient, rpt 5 yrs [name]) - seen GI 07/2016 and endorses normal iFOB  Prostate cancer screening - always normal saw urology. Age out Lung cancer screening - non smoker Flu shot yearly  Tetanus shot unsure  Pneumovax 2011, prevnar 2016  zostavax - ~2014 shingrix - discussed Advanced directive - Advanced directives: scanned and in chart 07/2016. Wants wife Antaeus Karel to be HCPOA then children. Grants HCPOA discretion for medical decisions Seat belt use discussed  Sunscreen use discussed. No changing moles on skin. Saw Dr Allyson Sabal derm  Non smoker  Alcohol - 1-2 beers/day   Lives with wife, no pets Retired Occ: Art gallery manager Edu: master's Activity: golf and walking - knee pian limits his Diet: good water, fruits/vegetables daily   Relevant past medical,  surgical, family and social history reviewed and updated as indicated. Interim medical history since our last visit reviewed. Allergies and medications reviewed and updated. Outpatient Medications Prior to Visit  Medication Sig Dispense Refill  . Ascorbic Acid (VITAMIN C PO) Take 1 tablet by mouth daily.     [name] atorvastatin (LIPITOR) 40 MG tablet TAKE 1 TABLET DAILY 90 tablet 3  . Cholecalciferol (VITAMIN D3) 2000 units TABS Take 2,000 Units by mouth daily.    [name] diltiazem (CARTIA XT) 180 MG 24 hr capsule Take 1 capsule (180 mg total) by mouth daily. 90 capsule 3  . ezetimibe (ZETIA) 10 MG tablet TAKE 1 TABLET DAILY 90 tablet 3  . furosemide (LASIX) 20 MG tablet TAKE 1 TABLET DAILY 90 tablet 0  . losartan (COZAAR) 100 MG tablet TAKE 1 TABLET DAILY 90 tablet 3  . LYSINE PO Take 1 tablet by mouth daily.     . metoprolol succinate (TOPROL-XL) 100 MG 24 hr tablet Take 1 tablet (100 mg total) by mouth 2 (two) times daily. Take with or immediately following a meal. 180 tablet 1  . naproxen sodium (ANAPROX) 220 MG tablet Take 220 mg by mouth daily as needed (for aches/pain.).     [name] omeprazole (PRILOSEC) 20 MG capsule Take 20 mg by mouth daily.      . potassium chloride (K-DUR,KLOR-CON) 10 MEQ tablet TAKE 1 TABLET TWICE A DAY 180 tablet 3  . Tiotropium Bromide Monohydrate (SPIRIVA RESPIMAT) 1.25 MCG/ACT AERS  Inhale 1 puff into the lungs daily.    Alveda Reasons 20 MG TABS tablet TAKE 1 TABLET DAILY WITH SUPPER 90 tablet 1  . Tiotropium Bromide Monohydrate (SPIRIVA RESPIMAT) 1.25 MCG/ACT AERS Inhale 2 puffs into the lungs daily. 3 Inhaler 3   No facility-administered medications prior to visit.      Per HPI unless specifically indicated in ROS section below Review of Systems  Constitutional: Negative for activity change, appetite change, chills, fatigue, fever and unexpected weight change.  HENT: Negative for hearing loss.   Eyes: Negative for visual disturbance.  Respiratory: Negative for cough, chest  tightness, shortness of breath and wheezing.   Cardiovascular: Negative for chest pain, palpitations and leg swelling.  Gastrointestinal: Negative for abdominal distention, abdominal pain, blood in stool, constipation, diarrhea, nausea and vomiting.  Genitourinary: Negative for difficulty urinating and hematuria.  Musculoskeletal: Negative for arthralgias, myalgias and neck pain.  Skin: Negative for rash.  Neurological: Negative for dizziness, seizures, syncope and headaches.  Hematological: Negative for adenopathy. Bruises/bleeds easily.  Psychiatric/Behavioral: Negative for dysphoric mood. The patient is not nervous/anxious.        Objective:    BP 122/80 (BP Location: Left Arm, Patient Position: Sitting, Cuff Size: Normal)   Pulse (!) 59   Temp 97.9 F (36.6 C) (Oral)   Ht 5\' 10"  (1.778 m)   Wt 219 lb (99.3 kg)   SpO2 97%   BMI 31.42 kg/m   Wt Readings from Last 3 Encounters:  01/13/17 219 lb (99.3 kg)  12/24/16 219 lb 9.6 oz (99.6 kg)  10/23/16 221 lb (100.2 kg)    Physical Exam  Constitutional: He is oriented to person, place, and time. He appears well-developed and well-nourished. No distress.  HENT:  Head: Normocephalic and atraumatic.  Right Ear: Hearing, tympanic membrane, external ear and ear canal normal.  Left Ear: Hearing, tympanic membrane, external ear and ear canal normal.  Nose: Nose normal.  Mouth/Throat: Uvula is midline, oropharynx is clear and moist and mucous membranes are normal. No oropharyngeal exudate, posterior oropharyngeal edema or posterior oropharyngeal erythema.  Eyes: Conjunctivae and EOM are normal. Pupils are equal, round, and reactive to light. No scleral icterus.  Neck: Normal range of motion. Neck supple. Carotid bruit is not present. No thyromegaly present.  Cardiovascular: Normal rate, regular rhythm, normal heart sounds and intact distal pulses.  No murmur heard. Pulses:      Radial pulses are 2+ on the right side, and 2+ on the left  side.  Pulmonary/Chest: Effort normal and breath sounds normal. No respiratory distress. He has no wheezes. He has no rales.  Abdominal: Soft. Bowel sounds are normal. He exhibits no distension and no mass. There is no tenderness. There is no rebound and no guarding.  Musculoskeletal: Normal range of motion. He exhibits no edema.  Lymphadenopathy:    He has no cervical adenopathy.  Neurological: He is alert and oriented to person, place, and time.  CN grossly intact, station and gait intact  Skin: Skin is warm and dry. No rash noted.  Psychiatric: He has a normal mood and affect. His behavior is normal. Judgment and thought content normal.  Nursing note and vitals reviewed.  Results for orders placed or performed in visit on 01/09/17  Comprehensive metabolic panel  Result Value Ref Range   Sodium 135 135 - 145 mEq/L   Potassium 4.4 3.5 - 5.1 mEq/L   Chloride 98 96 - 112 mEq/L   CO2 32 19 - 32 mEq/L  Glucose, Bld 98 70 - 99 mg/dL   BUN 15 6 - 23 mg/dL   Creatinine, Ser 1.04 0.40 - 1.50 mg/dL   Total Bilirubin 0.7 0.2 - 1.2 mg/dL   Alkaline Phosphatase 63 39 - 117 U/L   AST 19 0 - 37 U/L   ALT 21 0 - 53 U/L   Total Protein 7.0 6.0 - 8.3 g/dL   Albumin 4.2 3.5 - 5.2 g/dL   Calcium 9.5 8.4 - 10.5 mg/dL   GFR 72.97 >60.00 mL/min  Lipid panel  Result Value Ref Range   Cholesterol 125 0 - 200 mg/dL   Triglycerides 44.0 0.0 - 149.0 mg/dL   HDL 55.70 >39.00 mg/dL   VLDL 8.8 0.0 - 40.0 mg/dL   LDL Cholesterol 60 0 - 99 mg/dL   Total CHOL/HDL Ratio 2    NonHDL 69.26    Lab Results  Component Value Date   WBC 7.6 10/15/2016   HGB 14.2 10/15/2016   HCT 41.4 10/15/2016   MCV 93.9 10/15/2016   PLT 164 10/15/2016   DG Chest 2 View CLINICAL DATA:  Left-sided chest pain radiating to the back and jaw. History of heart ablation for atrial fibrillation last year. History of hypertension.  EXAM: CHEST  2 VIEW  COMPARISON:  09/16/2016  FINDINGS: Mild hyperinflation. Linear  atelectasis or fibrosis in the left lung base. This is similar to previous study. Focal segmental elevation of the right hemidiaphragm. No consolidation or airspace disease in the lungs. No blunting of costophrenic angles. No pneumothorax. Mediastinal contours appear intact. Postoperative changes in the cervical spine. Degenerative changes in the thoracic spine.  IMPRESSION: Atelectasis or fibrosis in the left lung base. No evidence of active pulmonary disease.  Electronically Signed   By: Lucienne Capers M.D.   On: 10/15/2016 23:09      Assessment & Plan:   Problem List Items Addressed This Visit    Advanced care planning/counseling discussion    Advanced directives: scanned and in chart 07/2016. Wants wife Simuel Stebner to be HCPOA then children. Grants HCPOA discretion for medical decisions      CAD (coronary artery disease), native coronary artery (Chronic)    Will see cards for cardiac clearance.      COPD mixed type ([place])    Followed by pulm, doing well with slow titration of spiriva - down to 1 puff at this time.       Relevant Medications   Tiotropium Bromide Monohydrate (SPIRIVA RESPIMAT) 1.25 MCG/ACT AERS   Essential hypertension (Chronic)    Chronic, stable. Continue current regimen.       GERD (gastroesophageal reflux disease)    Chronic, stable on omeprazole 20mg  daily.      Health maintenance examination - Primary    Preventative protocols reviewed and updated unless pt declined. Discussed healthy diet and lifestyle.       HLD (hyperlipidemia)    Chronic, stable. Continue zetia and lipitor.  The ASCVD Risk score Mikey Bussing DC Jr., et al., 2013) failed to calculate for the following reasons:   The 2013 ASCVD risk score is only valid for ages 20 to 33       Obesity, Class I, BMI 30.0-34.9 (see actual BMI)    Encouraged healthy diet and lifestyle changes to affect sustainable weight loss. Activity limiting osteoarthritis.       Osteoarthritis    Upcoming L  knee replacement [name])      Paroxysmal atrial fibrillation (HCC) (Chronic)    S/p cardioversion and ablation  and symptoms well controlled since then. Pending cardiac clearance for upcoming surgery.       Preoperative clearance    Will see cards for cardiac clearance. Medical clearance performed today. Adequately low risk to proceed with orthopedic surgery.  Has tolerated GETA well in the past.  Latest CXR reviewed - some question of fibrosis vs atx LLL which pt states is chronic and stable. Denies dyspnea, cough, demonstrates normal O2 sat today. COPD very well controlled.           Follow up plan: Return in about 1 year (around 01/13/2018) for annual exam, prior fasting for blood work.  [name], MD

## 2017-01-13 NOTE — Assessment & Plan Note (Signed)
Advanced directives: scanned and in chart 07/2016. Wants wife Theodore [name] to be HCPOA then children. Grants HCPOA discretion for medical decisions

## 2017-01-13 NOTE — Assessment & Plan Note (Signed)
Chronic, stable. Continue current regimen. 

## 2017-01-13 NOTE — Patient Instructions (Addendum)
Get cardiology clearance.  Medical clearance done today - we will fax this to Dr Aurea Graff office. If interested, check with pharmacy about new 2 shot shingles series (shingrix).  You are doing well today. Keep appointment with Katha Cabal later next summer.  Return to see me for physical in 1 year.   Health Maintenance, Male A healthy lifestyle and preventive care is important for your health and wellness. Ask your health care provider about what schedule of regular examinations is right for you. What should I know about weight and diet? Eat a Healthy Diet  Eat plenty of vegetables, fruits, whole grains, low-fat dairy products, and lean protein.  Do not eat a lot of foods high in solid fats, added sugars, or salt.  Maintain a Healthy Weight Regular exercise can help you achieve or maintain a healthy weight. You should:  Do at least 150 minutes of exercise each week. The exercise should increase your heart rate and make you sweat (moderate-intensity exercise).  Do strength-training exercises at least twice a week.  Watch Your Levels of Cholesterol and Blood Lipids  Have your blood tested for lipids and cholesterol every 5 years starting at 80 years of age. If you are at high risk for heart disease, you should start having your blood tested when you are 80 years old. You may need to have your cholesterol levels checked more often if: ? Your lipid or cholesterol levels are high. ? You are older than 80 years of age. ? You are at high risk for heart disease.  What should I know about cancer screening? Many types of cancers can be detected early and may often be prevented. Lung Cancer  You should be screened every year for lung cancer if: ? You are a current smoker who has smoked for at least 30 years. ? You are a former smoker who has quit within the past 15 years.  Talk to your health care provider about your screening options, when you should start screening, and how often you should be  screened.  Colorectal Cancer  Routine colorectal cancer screening usually begins at 80 years of age and should be repeated every 5-10 years until you are 80 years old. You may need to be screened more often if early forms of precancerous polyps or small growths are found. Your health care provider may recommend screening at an earlier age if you have risk factors for colon cancer.  Your health care provider may recommend using home test kits to check for hidden blood in the stool.  A small camera at the end of a tube can be used to examine your colon (sigmoidoscopy or colonoscopy). This checks for the earliest forms of colorectal cancer.  Prostate and Testicular Cancer  Depending on your age and overall health, your health care provider may do certain tests to screen for prostate and testicular cancer.  Talk to your health care provider about any symptoms or concerns you have about testicular or prostate cancer.  Skin Cancer  Check your skin from head to toe regularly.  Tell your health care provider about any new moles or changes in moles, especially if: ? There is a change in a mole's size, shape, or color. ? You have a mole that is larger than a pencil eraser.  Always use sunscreen. Apply sunscreen liberally and repeat throughout the day.  Protect yourself by wearing long sleeves, pants, a wide-brimmed hat, and sunglasses when outside.  What should I know about heart disease, diabetes, and  high blood pressure?  If you are 72-64 years of age, have your blood pressure checked every 3-5 years. If you are 57 years of age or older, have your blood pressure checked every year. You should have your blood pressure measured twice-once when you are at a hospital or clinic, and once when you are not at a hospital or clinic. Record the average of the two measurements. To check your blood pressure when you are not at a hospital or clinic, you can use: ? An automated blood pressure machine at a  pharmacy. ? A home blood pressure monitor.  Talk to your health care provider about your target blood pressure.  If you are between 69-27 years old, ask your health care provider if you should take aspirin to prevent heart disease.  Have regular diabetes screenings by checking your fasting blood sugar level. ? If you are at a normal weight and have a low risk for diabetes, have this test once every three years after the age of 51. ? If you are overweight and have a high risk for diabetes, consider being tested at a younger age or more often.  A one-time screening for abdominal aortic aneurysm (AAA) by ultrasound is recommended for men aged 10-75 years who are current or former smokers. What should I know about preventing infection? Hepatitis B If you have a higher risk for hepatitis B, you should be screened for this virus. Talk with your health care provider to find out if you are at risk for hepatitis B infection. Hepatitis C Blood testing is recommended for:  Everyone born from [birth year] through 1963.  Anyone with known risk factors for hepatitis C.  Sexually Transmitted Diseases (STDs)  You should be screened each year for STDs including gonorrhea and chlamydia if: ? You are sexually active and are younger than 80 years of age. ? You are older than 80 years of age and your health care provider tells you that you are at risk for this type of infection. ? Your sexual activity has changed since you were last screened and you are at an increased risk for chlamydia or gonorrhea. Ask your health care provider if you are at risk.  Talk with your health care provider about whether you are at high risk of being infected with HIV. Your health care provider may recommend a prescription medicine to help prevent HIV infection.  What else can I do?  Schedule regular health, dental, and eye exams.  Stay current with your vaccines (immunizations).  Do not use any tobacco products, such as  cigarettes, chewing tobacco, and e-cigarettes. If you need help quitting, ask your health care provider.  Limit alcohol intake to no more than 2 drinks per day. One drink equals 12 ounces of beer, 5 ounces of wine, or 1 ounces of hard liquor.  Do not use street drugs.  Do not share needles.  Ask your health care provider for help if you need support or information about quitting drugs.  Tell your health care provider if you often feel depressed.  Tell your health care provider if you have ever been abused or do not feel safe at home. This information is not intended to replace advice given to you by your health care provider. Make sure you discuss any questions you have with your health care provider. Document Released: 07/26/2007 Document Revised: 09/26/2015 Document Reviewed: 10/31/2014 Elsevier Interactive Patient Education  [employer].

## 2017-01-14 ENCOUNTER — Encounter: Payer: Self-pay | Admitting: Family Medicine

## 2017-01-20 ENCOUNTER — Other Ambulatory Visit: Payer: Self-pay | Admitting: Cardiovascular Disease

## 2017-01-21 NOTE — Telephone Encounter (Signed)
Please review for refill, Thanks !  

## 2017-01-23 ENCOUNTER — Telehealth: Payer: Self-pay | Admitting: *Deleted

## 2017-01-23 NOTE — Telephone Encounter (Signed)
   [facility] Pre-operative Risk Assessment    Request for surgical clearance:  1. What type of surgery is being performed? Left TKA, excision left distal thigh mass   2. When is this surgery scheduled? 02/09/17   3. Are there any medications that need to be held prior to surgery and how long? Xarelto   4. Practice name and name of physician performing surgery? [facility]-Dr. [name]   5. What is your office phone and fax number? [phone]   6. Anesthesia type (None, local, MAC, general) ?    Theodore [name] Theodore [name] 01/23/2017, 10:45 AM  _________________________________________________________________   (provider comments below)

## 2017-01-23 NOTE — Telephone Encounter (Signed)
Patient with diagnosis of atrial fibrillation on Xarelto for anticoagulation.    Procedure: left TKA, excision of thigh mass Date of procedure: 02/09/17  CHADS2-VASc score of  4 (HTN, AGE x 2, CAD)  CrCl 79.6 Platelet count 164  Per office protocol, patient can hold Xarelto for 2 days prior to procedure.   Patient will not need bridging with Lovenox (enoxaparin) around procedure.  Patient should restart Xarelto on the evening of procedure or day after, at discretion of procedure MD  For orthopedic procedures please be sure to resume Xarelto at therapeutic (not prophylactic) dosing.

## 2017-01-23 NOTE — Telephone Encounter (Signed)
   Primary Cardiologist: Dr. [name] Chart reviewed as part of pre-operative protocol coverage. Given past medical history and time since last visit, based on ACC/AHA guidelines, Theodore [name] would be at acceptable risk for the planned procedure without further cardiovascular testing.   For his Xarelto he is instructed to hold 2 days prior to surgery and resume afterwards as soon as Dr. [name] will allow.   I will route this recommendation to the requesting party via Epic fax function and remove from pre-op pool.  Please call with questions.  Cecilie Kicks, NP 01/23/2017, 4:50 PM

## 2017-01-27 ENCOUNTER — Other Ambulatory Visit (HOSPITAL_COMMUNITY): Payer: Self-pay | Admitting: Emergency Medicine

## 2017-01-27 NOTE — Progress Notes (Signed)
Cardiology clearance Cecilie Kicks NP 01-23-17 epic telephone note   EKG 10-16-16 Epic   CXR 10-15-16 Epic  STRESS TEST 10-17-15 Epic  ECHO 10-08-15 Epic

## 2017-01-27 NOTE — Patient Instructions (Signed)
Theodore [name]  01/27/2017   Your procedure is scheduled on: 02-09-17  Report to [facility] Main  Entrance    Report to admitting at 830AM   Call this number if you have problems the morning of surgery [phone]   Remember: ONLY 1 PERSON MAY GO WITH YOU TO SHORT STAY TO GET  READY MORNING OF YOUR SURGERY.    Do not eat food or drink liquids :After Midnight.     Take these medicines the morning of surgery with A SIP OF WATER: METOPROLOL, DILTIAZEM, EZETIMIBE, ATORVASTATIN                                 You may not have any metal on your body including hair pins and              piercings  Do not wear jewelry, make-up, lotions, powders or perfumes, deodorant                     Men may shave face and neck.   Do not bring valuables to the hospital. [place].  Contacts, dentures or bridgework may not be worn into surgery.  Leave suitcase in the car. After surgery it may be brought to your room.                Please read over the following fact sheets you were given: _____________________________________________________________________            [facility] - Preparing for Surgery Before surgery, you can play an important role.  Because skin is not sterile, your skin needs to be as free of germs as possible.  You can reduce the number of germs on your skin by washing with CHG (chlorahexidine gluconate) soap before surgery.  CHG is an antiseptic cleaner which kills germs and bonds with the skin to continue killing germs even after washing. Please DO NOT use if you have an allergy to CHG or antibacterial soaps.  If your skin becomes reddened/irritated stop using the CHG and inform your nurse when you arrive at Short Stay. Do not shave (including legs and underarms) for at least 48 hours prior to the first CHG shower.  You may shave your face/neck. Please follow these instructions carefully:  1.   Shower with CHG Soap the night before surgery and the  morning of Surgery.  2.  If you choose to wash your hair, wash your hair first as usual with your  normal  shampoo.  3.  After you shampoo, rinse your hair and body thoroughly to remove the  shampoo.                           4.  Use CHG as you would any other liquid soap.  You can apply chg directly  to the skin and wash                       Gently with a scrungie or clean washcloth.  5.  Apply the CHG Soap to your body ONLY FROM THE NECK DOWN.   Do not use on face/ open  Wound or open sores. Avoid contact with eyes, ears mouth and genitals (private parts).                       Wash face,  Genitals (private parts) with your normal soap.             6.  Wash thoroughly, paying special attention to the area where your surgery  will be performed.  7.  Thoroughly rinse your body with warm water from the neck down.  8.  DO NOT shower/wash with your normal soap after using and rinsing off  the CHG Soap.                9.  Pat yourself dry with a clean towel.            10.  Wear clean pajamas.            11.  Place clean sheets on your bed the night of your first shower and do not  sleep with pets. Day of Surgery : Do not apply any lotions/deodorants the morning of surgery.  Please wear clean clothes to the hospital/surgery center.  FAILURE TO FOLLOW THESE INSTRUCTIONS MAY RESULT IN THE CANCELLATION OF YOUR SURGERY PATIENT SIGNATURE_________________________________  NURSE SIGNATURE__________________________________  ________________________________________________________________________   Adam Phenix  An incentive spirometer is a tool that can help keep your lungs clear and active. This tool measures how well you are filling your lungs with each breath. Taking long deep breaths may help reverse or decrease the chance of developing breathing (pulmonary) problems (especially infection) following:  A long  period of time when you are unable to move or be active. BEFORE THE PROCEDURE   If the spirometer includes an indicator to show your best effort, your nurse or respiratory therapist will set it to a desired goal.  If possible, sit up straight or lean slightly forward. Try not to slouch.  Hold the incentive spirometer in an upright position. INSTRUCTIONS FOR USE  1. Sit on the edge of your bed if possible, or sit up as far as you can in bed or on a chair. 2. Hold the incentive spirometer in an upright position. 3. Breathe out normally. 4. Place the mouthpiece in your mouth and seal your lips tightly around it. 5. Breathe in slowly and as deeply as possible, raising the piston or the ball toward the top of the column. 6. Hold your breath for 3-5 seconds or for as long as possible. Allow the piston or ball to fall to the bottom of the column. 7. Remove the mouthpiece from your mouth and breathe out normally. 8. Rest for a few seconds and repeat Steps 1 through 7 at least 10 times every 1-2 hours when you are awake. Take your time and take a few normal breaths between deep breaths. 9. The spirometer may include an indicator to show your best effort. Use the indicator as a goal to work toward during each repetition. 10. After each set of 10 deep breaths, practice coughing to be sure your lungs are clear. If you have an incision (the cut made at the time of surgery), support your incision when coughing by placing a pillow or rolled up towels firmly against it. Once you are able to get out of bed, walk around indoors and cough well. You may stop using the incentive spirometer when instructed by your caregiver.  RISKS AND COMPLICATIONS  Take your time so you do not get  dizzy or light-headed.  If you are in pain, you may need to take or ask for pain medication before doing incentive spirometry. It is harder to take a deep breath if you are having pain. AFTER USE  Rest and breathe slowly and  easily.  It can be helpful to keep track of a log of your progress. Your caregiver can provide you with a simple table to help with this. If you are using the spirometer at home, follow these instructions: [place] IF:   You are having difficultly using the spirometer.  You have trouble using the spirometer as often as instructed.  Your pain medication is not giving enough relief while using the spirometer.  You develop fever of 100.5 F (38.1 C) or higher. SEEK IMMEDIATE MEDICAL CARE IF:   You cough up bloody sputum that had not been present before.  You develop fever of 102 F (38.9 C) or greater.  You develop worsening pain at or near the incision site. MAKE SURE YOU:   Understand these instructions.  Will watch your condition.  Will get help right away if you are not doing well or get worse. Document Released: 06/09/2006 Document Revised: 04/21/2011 Document Reviewed: 08/10/2006 Exit[employer] Patient Information 2014 Exit[employer], [place].   ________________________________________________________________________  WHAT IS A BLOOD TRANSFUSION? Blood Transfusion Information  A transfusion is the replacement of blood or some of its parts. Blood is made up of multiple cells which provide different functions.  Red blood cells carry oxygen and are used for blood loss replacement.  White blood cells fight against infection.  Platelets control bleeding.  Plasma helps clot blood.  Other blood products are available for specialized needs, such as hemophilia or other clotting disorders. BEFORE THE TRANSFUSION  Who gives blood for transfusions?   Healthy volunteers who are fully evaluated to make sure their blood is safe. This is blood bank blood. Transfusion therapy is the safest it has ever been in the practice of medicine. Before blood is taken from a donor, a complete history is taken to make sure that person has no history of diseases nor engages in risky social  behavior (examples are intravenous drug use or sexual activity with multiple partners). The donor's travel history is screened to minimize risk of transmitting infections, such as malaria. The donated blood is tested for signs of infectious diseases, such as HIV and hepatitis. The blood is then tested to be sure it is compatible with you in order to minimize the chance of a transfusion reaction. If you or a relative donates blood, this is often done in anticipation of surgery and is not appropriate for emergency situations. It takes many days to process the donated blood. RISKS AND COMPLICATIONS Although transfusion therapy is very safe and saves many lives, the main dangers of transfusion include:   Getting an infectious disease.  Developing a transfusion reaction. This is an allergic reaction to something in the blood you were given. Every precaution is taken to prevent this. The decision to have a blood transfusion has been considered carefully by your caregiver before blood is given. Blood is not given unless the benefits outweigh the risks. AFTER THE TRANSFUSION  Right after receiving a blood transfusion, you will usually feel much better and more energetic. This is especially true if your red blood cells have gotten low (anemic). The transfusion raises the level of the red blood cells which carry oxygen, and this usually causes an energy increase.  The nurse administering the transfusion will  monitor you carefully for complications. HOME CARE INSTRUCTIONS  No special instructions are needed after a transfusion. You may find your energy is better. Speak with your caregiver about any limitations on activity for underlying diseases you may have. SEEK MEDICAL CARE IF:   Your condition is not improving after your transfusion.  You develop redness or irritation at the intravenous (IV) site. SEEK IMMEDIATE MEDICAL CARE IF:  Any of the following symptoms occur over the next 12 hours:  Shaking  chills.  You have a temperature by mouth above 102 F (38.9 C), not controlled by medicine.  Chest, back, or muscle pain.  People around you feel you are not acting correctly or are confused.  Shortness of breath or difficulty breathing.  Dizziness and fainting.  You get a rash or develop hives.  You have a decrease in urine output.  Your urine turns a dark color or changes to pink, red, or brown. Any of the following symptoms occur over the next 10 days:  You have a temperature by mouth above 102 F (38.9 C), not controlled by medicine.  Shortness of breath.  Weakness after normal activity.  The white part of the eye turns yellow (jaundice).  You have a decrease in the amount of urine or are urinating less often.  Your urine turns a dark color or changes to pink, red, or brown. Document Released: 01/25/2000 Document Revised: 04/21/2011 Document Reviewed: 09/13/2007 [facility] Patient Information 2014 [place], [place].  _______________________________________________________________________

## 2017-01-28 ENCOUNTER — Encounter (HOSPITAL_COMMUNITY): Payer: Self-pay

## 2017-01-28 ENCOUNTER — Other Ambulatory Visit: Payer: Self-pay

## 2017-01-28 ENCOUNTER — Ambulatory Visit: Payer: Medicare Other | Admitting: Cardiology

## 2017-01-28 ENCOUNTER — Encounter (HOSPITAL_COMMUNITY)
Admission: RE | Admit: 2017-01-28 | Discharge: 2017-01-28 | Disposition: A | Discharge status: Home / Self Care | Payer: Medicare Other | Source: Ambulatory Visit | Attending: Orthopedic Surgery | Admitting: Orthopedic Surgery

## 2017-01-28 DIAGNOSIS — Z01812 Encounter for preprocedural laboratory examination: Secondary | ICD-10-CM | POA: Insufficient documentation

## 2017-01-28 DIAGNOSIS — M1712 Unilateral primary osteoarthritis, left knee: Secondary | ICD-10-CM | POA: Insufficient documentation

## 2017-01-28 DIAGNOSIS — D1724 Benign lipomatous neoplasm of skin and subcutaneous tissue of left leg: Secondary | ICD-10-CM | POA: Insufficient documentation

## 2017-01-28 LAB — BASIC METABOLIC PANEL
Anion gap: 7 (ref 5–15)
BUN: 22 mg/dL — AB (ref 6–20)
CALCIUM: 9.2 mg/dL (ref 8.9–10.3)
CO2: 27 mmol/L (ref 22–32)
CREATININE: 1.04 mg/dL (ref 0.61–1.24)
Chloride: 100 mmol/L — ABNORMAL LOW (ref 101–111)
GFR calc Af Amer: >60 mL/min (ref >60)
GLUCOSE: 124 mg/dL — AB (ref 65–99)
Potassium: 4.6 mmol/L (ref 3.5–5.1)
Sodium: 134 mmol/L — ABNORMAL LOW (ref 135–145)

## 2017-01-28 LAB — CBC
HCT: 43.8 % (ref 39.0–52.0)
Hemoglobin: 14.8 g/dL (ref 13.0–17.0)
MCH: 31.8 pg (ref 26.0–34.0)
MCHC: 33.8 g/dL (ref 30.0–36.0)
MCV: 94.2 fL (ref 78.0–100.0)
PLATELETS: 174 10*3/uL (ref 150–400)
RBC: 4.65 MIL/uL (ref 4.22–5.81)
RDW: 14.1 % (ref 11.5–15.5)
WBC: 7.8 10*3/uL (ref 4.0–10.5)

## 2017-01-28 LAB — SURGICAL PCR SCREEN
MRSA, PCR: NEGATIVE
STAPHYLOCOCCUS AUREUS: NEGATIVE

## 2017-02-04 NOTE — H&P (Signed)
TOTAL KNEE ADMISSION H&P  Patient is being admitted for left total knee arthroplasty and excision of distal thigh mass/lipoma.  Subjective:  Chief Complaint:   Left knee primary OA / pain  and distal thigh mass/lipoma  HPI: Theodore [name], 82 y.o. male, has a history of pain and functional disability in the left knee due to arthritis and has failed non-surgical conservative treatments for greater than 12 weeks to includeNSAID's and/or analgesics, corticosteriod injections and activity modification.  Onset of symptoms was gradual, starting 2+ years ago with gradually worsening course since that time. The patient noted prior procedures on the knee to include  arthroscopy on the left knee(s).  Patient currently rates pain in the left knee(s) at 7 out of 10 with activity. Patient has worsening of pain with activity and weight bearing, pain that interferes with activities of daily living, pain with passive range of motion, crepitus and joint swelling.  Patient has evidence of periarticular osteophytes and joint space narrowing by imaging studies. Patient also has present a distal thigh mass which has been present for year and gradually increasing in size.    There is no active infection.  Risks, benefits and expectations were discussed with the patient.  Risks including but not limited to the risk of anesthesia, blood clots, nerve damage, blood vessel damage, failure of the prosthesis, infection and up to and including death.  Patient understand the risks, benefits and expectations and wishes to proceed with surgery.   PCP: [name], MD  D/C Plans:       Home   Post-op Meds:       No Rx given  Tranexamic Acid:      To be given - IV    Decadron:      Is to be given  FYI:     Xarelto  Norco  DME:   Pt already has equipment   PT:   OPPT Rx given   Patient Active Problem List   Diagnosis Date Noted  . Preoperative clearance 01/13/2017  . Obesity, Class I, BMI 30.0-34.9 (see actual  BMI) 01/13/2017  . Peripheral edema   . History of rheumatic fever   . History of arthritis   . Tendonitis of elbow, left   . COPD exacerbation ([place]) 09/16/2016  . Health maintenance examination 02/06/2016  . Advanced care planning/counseling discussion 02/06/2016  . CAD (coronary artery disease), native coronary artery 09/28/2015  . Angina, class III ([place]) - with Dyspnea as major Sx. 09/28/2015  . Angina decubitus ([place])   . Abnormal nuclear stress test 09/06/2014  . Thrombocytopenia ([place]) 05/27/2013  . PVC's (premature ventricular contractions) 09/22/2012  . Insomnia 09/22/2012  . Osteoarthritis 06/18/2012  . Allergic-infective asthma 08/26/2011  . GERD (gastroesophageal reflux disease) 08/26/2011  . Paroxysmal atrial fibrillation ([place]) 01/21/2011  . Nocturia   . Right bundle branch block   . COPD mixed type ([place]) 12/28/2007  . HLD (hyperlipidemia) 11/27/2007  . Essential hypertension 11/27/2007  . Dyspnea on exertion 11/18/2007   Past Medical History:  Diagnosis Date  . History of arthritis   . History of rheumatic fever   . Hypercholesterolemia   . Hypertension   . Nocturia   . PAF (paroxysmal atrial fibrillation) [facility]) January 2013   Placed on Pradaxa. Did not require cardioversion; spontaneously converted  . Peripheral edema   . Right bundle branch block   . SOB (shortness of breath)   . Tendonitis of elbow, left     Past Surgical History:  Procedure  Laterality Date  . ATRIAL FIBRILLATION ABLATION  12/19/2015  . BACK SURGERY  12/24/09   fusion C3-C4  . BACK SURGERY  2010   fusion L4-L5  . CARDIAC CATHETERIZATION  2009   NONOBSTRUCTIVE ATHERSCLEROTIC CORONARY DISEASE AND NORMAL  LV FUNCTION  . CARDIAC CATHETERIZATION N/A 09/08/2014   Procedure: Left Heart Cath and Coronary Angiography;  Surgeon: Belva Crome, MD;  Location: [facility];  Service: Cardiovascular;  Laterality: N/A;  . CARDIAC CATHETERIZATION N/A 09/28/2015   Procedure: Left Heart Cath and  Coronary Angiography;  Surgeon: Leonie Man, MD;  Location: [facility] [facility];  Service: Cardiovascular;  Laterality: N/A;  . CARDIAC CATHETERIZATION N/A 09/28/2015   Procedure: Intravascular Pressure Wire/FFR Study;  Surgeon: Leonie Man, MD;  Location: [place] [facility];  Service: Cardiovascular;  Laterality: N/A;  . CARDIOVERSION N/A 10/23/2015   Procedure: CARDIOVERSION;  Surgeon: [name], MD;  Location: [facility];  Service: Cardiovascular;  Laterality: N/A;  . COLONOSCOPY  2013   per patient, rpt 5 yrs  . ELECTROPHYSIOLOGIC STUDY N/A 12/19/2015   Procedure: Atrial Fibrillation Ablation;  Surgeon: [name], MD;  Location: [place];  Service: Cardiovascular;  Laterality: N/A;  . KNEE ARTHROSCOPY Left 03/2016   Dr. [name]  . PATELLAR TENDON REPAIR Left 2008  . REPLACEMENT TOTAL KNEE Right 2006  . TRICEPS TENDON REPAIR Left 2013    No current facility-administered medications for this encounter.    Current Outpatient Medications  Medication Sig Dispense Refill Last Dose  . Ascorbic Acid (VITAMIN C PO) Take 1 tablet by mouth daily.    Taking  . atorvastatin (LIPITOR) 40 MG tablet TAKE 1 TABLET DAILY (Patient taking differently: Take 40 mg by mouth once daily) 90 tablet 3 Taking  . Cholecalciferol (VITAMIN D3) 2000 units TABS Take 2,000 Units by mouth daily.   Taking  . diclofenac sodium (VOLTAREN) 1 % GEL Apply 1 application topically daily.     [name] diltiazem (CARTIA XT) 180 MG 24 hr capsule Take 1 capsule (180 mg total) by mouth daily. 90 capsule 3 Taking  . ezetimibe (ZETIA) 10 MG tablet TAKE 1 TABLET DAILY (Patient taking differently: Take 10 mg by mouth once daily) 90 tablet 3 Taking  . furosemide (LASIX) 20 MG tablet TAKE 1 TABLET DAILY (Patient taking differently: Take 20 mg by mouth once daily) 90 tablet 0   . losartan (COZAAR) 100 MG tablet TAKE 1 TABLET DAILY (Patient taking differently: Take 100 mg by mouth once daily) 90 tablet 3 Taking  .  LYSINE PO Take 1 tablet by mouth daily.    Taking  . metoprolol succinate (TOPROL-XL) 100 MG 24 hr tablet Take 1 tablet (100 mg total) by mouth 2 (two) times daily. Take with or immediately following a meal. 180 tablet 1 Taking  . naproxen sodium (ALEVE) 220 MG tablet Take 220 mg by mouth 2 (two) times daily as needed (for pain or headache).     [name] omeprazole (PRILOSEC) 20 MG capsule Take 20 mg by mouth daily.     Taking  . potassium chloride (K-DUR,KLOR-CON) 10 MEQ tablet TAKE 1 TABLET TWICE A DAY (Patient taking differently: Take 10 meq by mouth twice daily) 180 tablet 3 Taking  . Tiotropium Bromide Monohydrate (SPIRIVA RESPIMAT) 1.25 MCG/ACT AERS Inhale 2 puffs into the lungs daily.      Alveda Reasons 20 MG TABS tablet TAKE 1 TABLET DAILY WITH SUPPER (Patient taking differently: Take 20 mg by mouth  daily with supper) 90 tablet 1 Taking   Allergies  Allergen Reactions  . Penicillins Rash and Other (See Comments)    "Blistering rash" Has patient had a PCN reaction causing immediate rash, facial/tongue/throat swelling, SOB or lightheadedness with hypotension: Yes Has patient had a PCN reaction causing severe rash involving mucus membranes or skin necrosis: No Has patient had a PCN reaction that required hospitalization: No Has patient had a PCN reaction occurring within the last 10 years: No If all of the above answers are "NO", then may proceed with Cephalosporin use.   . Amlodipine Other (See Comments)    Peripheral edema  . Lisinopril Cough  . Zocor [Simvastatin] Other (See Comments)    LFT increase with simvastatin and lovastatin   . Oseltamivir Phosphate Itching and Rash    Social History   Tobacco Use  . Smoking status: Never Smoker  . Smokeless tobacco: Never Used  Substance Use Topics  . Alcohol use: Yes    Alcohol/week: 1.2 oz    Types: 2 Cans of beer per week    Comment: 2 beers daily    Family History  Problem Relation Age of Onset  . Heart attack Father   . COPD Father    . Hypertension Father   . Heart disease Brother   . COPD Brother   . Asthma Sister   . Stroke Neg Hx      Review of Systems  Constitutional: Negative.   HENT: Negative.   Eyes: Negative.   Respiratory: Negative.   Cardiovascular: Negative.   Gastrointestinal: Negative.   Genitourinary: Negative.   Musculoskeletal: Positive for joint pain.  Skin: Negative.   Neurological: Negative.   Endo/Heme/Allergies: Negative.   Psychiatric/Behavioral: Negative.     Objective:  Physical Exam  Constitutional: He is oriented to person, place, and time. He appears well-developed.  HENT:  Head: Normocephalic.  Eyes: Pupils are equal, round, and reactive to light.  Neck: Neck supple. No JVD present. No tracheal deviation present. No thyromegaly present.  Cardiovascular: Normal rate, regular rhythm and intact distal pulses.  Respiratory: Effort normal and breath sounds normal. No respiratory distress. He has no wheezes.  GI: Soft. There is no tenderness. There is no guarding.  Musculoskeletal:       Left knee: He exhibits decreased range of motion, swelling and bony tenderness. He exhibits no ecchymosis, no deformity, no laceration and no erythema. Tenderness found.  Lymphadenopathy:    He has no cervical adenopathy.  Neurological: He is alert and oriented to person, place, and time.  Skin: Skin is warm and dry.  Psychiatric: He has a normal mood and affect.      Labs:  Estimated body mass index is 30.96 kg/m as calculated from the following:   Height as of 01/28/17: 5\' 11"  (1.803 m).   Weight as of 01/28/17: 100.7 kg (222 lb).   Imaging Review Plain radiographs demonstrate severe degenerative joint disease of the left knee(s). The overall alignment isneutral. The bone quality appears to be good for age and reported activity level.  Assessment/Plan:  End stage arthritis, left knee   The patient history, physical examination, clinical judgment of the provider and imaging  studies are consistent with end stage degenerative joint disease of the left knee(s) and total knee arthroplasty is deemed medically necessary. The treatment options including medical management, injection therapy arthroscopy and arthroplasty were discussed at length. The risks and benefits of total knee arthroplasty were presented and reviewed. The risks due to aseptic loosening,  infection, stiffness, patella tracking problems, thromboembolic complications and other imponderables were discussed. The patient acknowledged the explanation, agreed to proceed with the plan and consent was signed. Patient is being admitted for inpatient treatment for surgery, pain control, PT, OT, prophylactic antibiotics, VTE prophylaxis, progressive ambulation and ADL's and discharge planning. The patient is planning to be discharged home.     West Pugh Linetta Regner   PA-C  02/04/2017, 8:57 AM

## 2017-02-08 MED ORDER — VANCOMYCIN HCL 10 G IV SOLR
1500.0000 mg | INTRAVENOUS | Status: AC
  Administered 2017-02-09: 1500 mg via INTRAVENOUS
  Filled 2017-02-08: qty 1500

## 2017-02-09 ENCOUNTER — Encounter (HOSPITAL_COMMUNITY): Payer: Self-pay | Admitting: *Deleted

## 2017-02-09 ENCOUNTER — Inpatient Hospital Stay (HOSPITAL_COMMUNITY): Payer: Medicare Other | Admitting: Registered Nurse

## 2017-02-09 ENCOUNTER — Encounter (HOSPITAL_COMMUNITY)
Admission: RE | Disposition: A | Discharge status: Home / Self Care | Payer: Self-pay | Source: Ambulatory Visit | Attending: Orthopedic Surgery

## 2017-02-09 ENCOUNTER — Other Ambulatory Visit: Payer: Self-pay

## 2017-02-09 ENCOUNTER — Observation Stay (HOSPITAL_COMMUNITY)
Admission: RE | Admit: 2017-02-09 | Discharge: 2017-02-11 | Disposition: A | Discharge status: Home / Self Care | Payer: Medicare Other | Source: Ambulatory Visit | Attending: Orthopedic Surgery | Admitting: Orthopedic Surgery

## 2017-02-09 DIAGNOSIS — I10 Essential (primary) hypertension: Secondary | ICD-10-CM | POA: Insufficient documentation

## 2017-02-09 DIAGNOSIS — K219 Gastro-esophageal reflux disease without esophagitis: Secondary | ICD-10-CM | POA: Diagnosis not present

## 2017-02-09 DIAGNOSIS — J441 Chronic obstructive pulmonary disease with (acute) exacerbation: Secondary | ICD-10-CM | POA: Insufficient documentation

## 2017-02-09 DIAGNOSIS — M1712 Unilateral primary osteoarthritis, left knee: Principal | ICD-10-CM | POA: Insufficient documentation

## 2017-02-09 DIAGNOSIS — G4700 Insomnia, unspecified: Secondary | ICD-10-CM | POA: Diagnosis not present

## 2017-02-09 DIAGNOSIS — E7800 Pure hypercholesterolemia, unspecified: Secondary | ICD-10-CM | POA: Diagnosis not present

## 2017-02-09 DIAGNOSIS — I4510 Unspecified right bundle-branch block: Secondary | ICD-10-CM | POA: Insufficient documentation

## 2017-02-09 DIAGNOSIS — Z7901 Long term (current) use of anticoagulants: Secondary | ICD-10-CM | POA: Insufficient documentation

## 2017-02-09 DIAGNOSIS — I4891 Unspecified atrial fibrillation: Secondary | ICD-10-CM | POA: Insufficient documentation

## 2017-02-09 DIAGNOSIS — I480 Paroxysmal atrial fibrillation: Secondary | ICD-10-CM | POA: Insufficient documentation

## 2017-02-09 DIAGNOSIS — M1990 Unspecified osteoarthritis, unspecified site: Secondary | ICD-10-CM | POA: Diagnosis not present

## 2017-02-09 DIAGNOSIS — Z79899 Other long term (current) drug therapy: Secondary | ICD-10-CM | POA: Insufficient documentation

## 2017-02-09 DIAGNOSIS — Z880 Allergy status to penicillin: Secondary | ICD-10-CM | POA: Insufficient documentation

## 2017-02-09 DIAGNOSIS — Z96652 Presence of left artificial knee joint: Secondary | ICD-10-CM

## 2017-02-09 HISTORY — PX: TOTAL KNEE ARTHROPLASTY: SHX125

## 2017-02-09 LAB — TYPE AND SCREEN
ABO/RH(D): A POS
ANTIBODY SCREEN: NEGATIVE

## 2017-02-09 SURGERY — ARTHROPLASTY, KNEE, TOTAL
Anesthesia: Spinal | Site: Knee | Laterality: Left

## 2017-02-09 MED ORDER — FUROSEMIDE 20 MG PO TABS
20.0000 mg | ORAL_TABLET | Freq: Every day | ORAL | Status: DC
  Administered 2017-02-09 – 2017-02-10 (×2): 20 mg via ORAL
  Filled 2017-02-09 (×3): qty 1

## 2017-02-09 MED ORDER — BUPIVACAINE-EPINEPHRINE 0.25% -1:200000 IJ SOLN
INTRAMUSCULAR | Status: AC
  Filled 2017-02-09: qty 1

## 2017-02-09 MED ORDER — ONDANSETRON HCL 4 MG/2ML IJ SOLN
INTRAMUSCULAR | Status: DC | PRN
  Administered 2017-02-09: 4 mg via INTRAVENOUS

## 2017-02-09 MED ORDER — HYDROMORPHONE HCL 1 MG/ML IJ SOLN
0.5000 mg | INTRAMUSCULAR | Status: DC | PRN
  Administered 2017-02-09: 17:00:00 0.5 mg via INTRAVENOUS
  Filled 2017-02-09: qty 1

## 2017-02-09 MED ORDER — ONDANSETRON HCL 4 MG/2ML IJ SOLN
4.0000 mg | Freq: Four times a day (QID) | INTRAMUSCULAR | Status: DC | PRN
  Administered 2017-02-10: 11:00:00 4 mg via INTRAVENOUS
  Filled 2017-02-09: qty 2

## 2017-02-09 MED ORDER — ROPIVACAINE HCL 7.5 MG/ML IJ SOLN
INTRAMUSCULAR | Status: DC | PRN
  Administered 2017-02-09: 20 mL via PERINEURAL

## 2017-02-09 MED ORDER — LACTATED RINGERS IV SOLN
INTRAVENOUS | Status: DC
  Administered 2017-02-09 (×2): via INTRAVENOUS

## 2017-02-09 MED ORDER — MENTHOL 3 MG MT LOZG: 1.0000 | LOZENGE | OROMUCOSAL | Status: DC | PRN

## 2017-02-09 MED ORDER — ONDANSETRON HCL 4 MG PO TABS
4.0000 mg | ORAL_TABLET | Freq: Four times a day (QID) | ORAL | Status: DC | PRN
  Administered 2017-02-10: 4 mg via ORAL
  Filled 2017-02-09: qty 1

## 2017-02-09 MED ORDER — SODIUM CHLORIDE 0.9 % IJ SOLN
INTRAMUSCULAR | Status: AC
  Filled 2017-02-09: qty 50

## 2017-02-09 MED ORDER — BUPIVACAINE-EPINEPHRINE (PF) 0.25% -1:200000 IJ SOLN
INTRAMUSCULAR | Status: DC | PRN
  Administered 2017-02-09: 30 mL

## 2017-02-09 MED ORDER — DOCUSATE SODIUM 100 MG PO CAPS
100.0000 mg | ORAL_CAPSULE | Freq: Two times a day (BID) | ORAL | Status: DC
  Administered 2017-02-09 – 2017-02-11 (×4): 100 mg via ORAL
  Filled 2017-02-09 (×4): qty 1

## 2017-02-09 MED ORDER — ALUM & MAG HYDROXIDE-SIMETH 200-200-20 MG/5ML PO SUSP
30.0000 mL | ORAL | Status: DC | PRN
  Administered 2017-02-10: 30 mL via ORAL
  Filled 2017-02-09: qty 30

## 2017-02-09 MED ORDER — CHLORHEXIDINE GLUCONATE 4 % EX LIQD: 60.0000 mL | Freq: Once | CUTANEOUS | Status: DC

## 2017-02-09 MED ORDER — STERILE WATER FOR IRRIGATION IR SOLN
Status: DC | PRN
  Administered 2017-02-09: 2000 mL

## 2017-02-09 MED ORDER — PROPOFOL 10 MG/ML IV BOLUS
INTRAVENOUS | Status: AC
  Filled 2017-02-09: qty 20

## 2017-02-09 MED ORDER — ONDANSETRON HCL 4 MG/2ML IJ SOLN
INTRAMUSCULAR | Status: AC
  Filled 2017-02-09: qty 2

## 2017-02-09 MED ORDER — DOCUSATE SODIUM 100 MG PO CAPS: 100.0000 mg | ORAL_CAPSULE | Freq: Two times a day (BID) | ORAL | 0 refills | Status: DC

## 2017-02-09 MED ORDER — METOCLOPRAMIDE HCL 5 MG/ML IJ SOLN
5.0000 mg | Freq: Three times a day (TID) | INTRAMUSCULAR | Status: DC | PRN
  Administered 2017-02-10: 10 mg via INTRAVENOUS
  Filled 2017-02-09: qty 2

## 2017-02-09 MED ORDER — HYDROCODONE-ACETAMINOPHEN 7.5-325 MG PO TABS: 1.0000 | ORAL_TABLET | ORAL | 0 refills | Status: DC | PRN

## 2017-02-09 MED ORDER — DILTIAZEM HCL ER COATED BEADS 180 MG PO CP24
180.0000 mg | ORAL_CAPSULE | Freq: Every day | ORAL | Status: DC
  Administered 2017-02-10 – 2017-02-11 (×2): 180 mg via ORAL
  Filled 2017-02-09 (×2): qty 1

## 2017-02-09 MED ORDER — SODIUM CHLORIDE 0.9 % IJ SOLN
INTRAMUSCULAR | Status: DC | PRN
  Administered 2017-02-09: 30 mL

## 2017-02-09 MED ORDER — PROPOFOL 500 MG/50ML IV EMUL
INTRAVENOUS | Status: DC | PRN
  Administered 2017-02-09: 100 ug/kg/min via INTRAVENOUS

## 2017-02-09 MED ORDER — METOPROLOL SUCCINATE ER 100 MG PO TB24
100.0000 mg | ORAL_TABLET | Freq: Two times a day (BID) | ORAL | Status: DC
  Administered 2017-02-09 – 2017-02-11 (×4): 100 mg via ORAL
  Filled 2017-02-09 (×4): qty 1

## 2017-02-09 MED ORDER — KETOROLAC TROMETHAMINE 30 MG/ML IJ SOLN
INTRAMUSCULAR | Status: DC | PRN
  Administered 2017-02-09: 30 mg

## 2017-02-09 MED ORDER — MIDAZOLAM HCL 2 MG/2ML IJ SOLN
INTRAMUSCULAR | Status: AC
  Filled 2017-02-09: qty 2

## 2017-02-09 MED ORDER — SODIUM CHLORIDE 0.9 % IV SOLN
INTRAVENOUS | Status: DC
  Administered 2017-02-09: 14:00:00 75 mL/h via INTRAVENOUS
  Administered 2017-02-10 (×2): via INTRAVENOUS

## 2017-02-09 MED ORDER — RIVAROXABAN 10 MG PO TABS
10.0000 mg | ORAL_TABLET | Freq: Every day | ORAL | Status: DC
  Administered 2017-02-10 – 2017-02-11 (×2): 10 mg via ORAL
  Filled 2017-02-09 (×2): qty 1

## 2017-02-09 MED ORDER — PANTOPRAZOLE SODIUM 40 MG PO TBEC
80.0000 mg | DELAYED_RELEASE_TABLET | Freq: Every day | ORAL | Status: DC
  Administered 2017-02-09 – 2017-02-11 (×3): 80 mg via ORAL
  Filled 2017-02-09 (×3): qty 2

## 2017-02-09 MED ORDER — TRANEXAMIC ACID 1000 MG/10ML IV SOLN
1000.0000 mg | INTRAVENOUS | Status: AC
  Administered 2017-02-09: 1000 mg via INTRAVENOUS
  Filled 2017-02-09: qty 1100

## 2017-02-09 MED ORDER — POLYETHYLENE GLYCOL 3350 17 G PO PACK: 17.0000 g | PACK | Freq: Every day | ORAL | 0 refills | Status: DC | PRN

## 2017-02-09 MED ORDER — METOCLOPRAMIDE HCL 5 MG PO TABS: 5.0000 mg | ORAL_TABLET | Freq: Three times a day (TID) | ORAL | Status: DC | PRN

## 2017-02-09 MED ORDER — POLYETHYLENE GLYCOL 3350 17 G PO PACK: 17.0000 g | PACK | Freq: Every day | ORAL | Status: DC | PRN

## 2017-02-09 MED ORDER — VITAMIN C 500 MG PO TABS
1000.0000 mg | ORAL_TABLET | Freq: Every day | ORAL | Status: DC
  Administered 2017-02-09 – 2017-02-10 (×2): 1000 mg via ORAL
  Filled 2017-02-09 (×2): qty 2

## 2017-02-09 MED ORDER — PHENYLEPHRINE HCL 10 MG/ML IJ SOLN
INTRAVENOUS | Status: DC | PRN
  Administered 2017-02-09: 25 ug/min via INTRAVENOUS

## 2017-02-09 MED ORDER — HYDROCODONE-ACETAMINOPHEN 7.5-325 MG PO TABS
1.0000 | ORAL_TABLET | ORAL | Status: DC
  Administered 2017-02-09 (×2): 1 via ORAL
  Administered 2017-02-09 – 2017-02-10 (×5): 2 via ORAL
  Filled 2017-02-09 (×2): qty 1
  Filled 2017-02-09 (×5): qty 2
  Filled 2017-02-09: qty 1

## 2017-02-09 MED ORDER — PROPOFOL 10 MG/ML IV BOLUS
INTRAVENOUS | Status: DC | PRN
  Administered 2017-02-09 (×2): 20 mg via INTRAVENOUS

## 2017-02-09 MED ORDER — ACETAMINOPHEN 650 MG RE SUPP: 650.0000 mg | RECTAL | Status: DC | PRN

## 2017-02-09 MED ORDER — LYSINE 500 MG PO TABS: ORAL_TABLET | Freq: Every day | ORAL | Status: DC

## 2017-02-09 MED ORDER — NAPROXEN SODIUM 220 MG PO TABS: 220.0000 mg | ORAL_TABLET | Freq: Two times a day (BID) | ORAL | Status: DC | PRN

## 2017-02-09 MED ORDER — FENTANYL CITRATE (PF) 100 MCG/2ML IJ SOLN
INTRAMUSCULAR | Status: AC
  Filled 2017-02-09: qty 2

## 2017-02-09 MED ORDER — FENTANYL CITRATE (PF) 100 MCG/2ML IJ SOLN
25.0000 ug | INTRAMUSCULAR | Status: DC | PRN
  Administered 2017-02-09 (×2): 50 ug via INTRAVENOUS

## 2017-02-09 MED ORDER — FENTANYL CITRATE (PF) 100 MCG/2ML IJ SOLN
INTRAMUSCULAR | Status: AC
  Administered 2017-02-09: 100 ug via INTRAVENOUS
  Filled 2017-02-09: qty 2

## 2017-02-09 MED ORDER — FERROUS SULFATE 325 (65 FE) MG PO TABS: 325.0000 mg | ORAL_TABLET | Freq: Two times a day (BID) | ORAL | 3 refills | Status: DC

## 2017-02-09 MED ORDER — VANCOMYCIN HCL IN DEXTROSE 1-5 GM/200ML-% IV SOLN
1000.0000 mg | Freq: Two times a day (BID) | INTRAVENOUS | Status: AC
  Administered 2017-02-09: 23:00:00 1000 mg via INTRAVENOUS
  Filled 2017-02-09: qty 200

## 2017-02-09 MED ORDER — PHENOL 1.4 % MT LIQD
1.0000 | OROMUCOSAL | Status: DC | PRN
  Filled 2017-02-09: qty 177

## 2017-02-09 MED ORDER — TIOTROPIUM BROMIDE MONOHYDRATE 1.25 MCG/ACT IN AERS
2.0000 | INHALATION_SPRAY | Freq: Every day | RESPIRATORY_TRACT | Status: DC
  Filled 2017-02-09 (×2): qty 1

## 2017-02-09 MED ORDER — LOSARTAN POTASSIUM 50 MG PO TABS
100.0000 mg | ORAL_TABLET | Freq: Every day | ORAL | Status: DC
  Administered 2017-02-09 – 2017-02-11 (×3): 100 mg via ORAL
  Filled 2017-02-09 (×3): qty 2

## 2017-02-09 MED ORDER — FENTANYL CITRATE (PF) 100 MCG/2ML IJ SOLN
INTRAMUSCULAR | Status: DC | PRN
  Administered 2017-02-09: 50 ug via INTRAVENOUS

## 2017-02-09 MED ORDER — METHOCARBAMOL 1000 MG/10ML IJ SOLN
500.0000 mg | Freq: Four times a day (QID) | INTRAVENOUS | Status: DC | PRN
  Administered 2017-02-09: 500 mg via INTRAVENOUS
  Filled 2017-02-09: qty 550

## 2017-02-09 MED ORDER — METHOCARBAMOL 500 MG PO TABS
500.0000 mg | ORAL_TABLET | Freq: Four times a day (QID) | ORAL | Status: DC | PRN
  Administered 2017-02-09 – 2017-02-10 (×2): 500 mg via ORAL
  Filled 2017-02-09 (×3): qty 1

## 2017-02-09 MED ORDER — NAPROXEN 250 MG PO TABS
250.0000 mg | ORAL_TABLET | Freq: Two times a day (BID) | ORAL | Status: DC | PRN
  Filled 2017-02-09: qty 1

## 2017-02-09 MED ORDER — DEXAMETHASONE SODIUM PHOSPHATE 10 MG/ML IJ SOLN
10.0000 mg | Freq: Once | INTRAMUSCULAR | Status: AC
  Administered 2017-02-10: 10 mg via INTRAVENOUS
  Filled 2017-02-09: qty 1

## 2017-02-09 MED ORDER — METHOCARBAMOL 500 MG PO TABS: 500.0000 mg | ORAL_TABLET | Freq: Four times a day (QID) | ORAL | 0 refills | Status: DC | PRN

## 2017-02-09 MED ORDER — FERROUS SULFATE 325 (65 FE) MG PO TABS
325.0000 mg | ORAL_TABLET | Freq: Two times a day (BID) | ORAL | Status: DC
  Administered 2017-02-10: 325 mg via ORAL
  Filled 2017-02-09: qty 1

## 2017-02-09 MED ORDER — POTASSIUM CHLORIDE CRYS ER 10 MEQ PO TBCR
10.0000 meq | EXTENDED_RELEASE_TABLET | Freq: Two times a day (BID) | ORAL | Status: DC
  Administered 2017-02-09 – 2017-02-10 (×3): 10 meq via ORAL
  Filled 2017-02-09 (×3): qty 1

## 2017-02-09 MED ORDER — SODIUM CHLORIDE 0.9 % IR SOLN
Status: DC | PRN
  Administered 2017-02-09: 1000 mL

## 2017-02-09 MED ORDER — CELECOXIB 200 MG PO CAPS
200.0000 mg | ORAL_CAPSULE | Freq: Two times a day (BID) | ORAL | Status: DC
  Administered 2017-02-09 – 2017-02-11 (×4): 200 mg via ORAL
  Filled 2017-02-09 (×4): qty 1

## 2017-02-09 MED ORDER — DEXAMETHASONE SODIUM PHOSPHATE 10 MG/ML IJ SOLN
INTRAMUSCULAR | Status: AC
  Filled 2017-02-09: qty 1

## 2017-02-09 MED ORDER — ACETAMINOPHEN 325 MG PO TABS: 650.0000 mg | ORAL_TABLET | ORAL | Status: DC | PRN

## 2017-02-09 MED ORDER — KETOROLAC TROMETHAMINE 30 MG/ML IJ SOLN
INTRAMUSCULAR | Status: AC
  Filled 2017-02-09: qty 1

## 2017-02-09 MED ORDER — ATORVASTATIN CALCIUM 40 MG PO TABS
40.0000 mg | ORAL_TABLET | Freq: Every day | ORAL | Status: DC
  Administered 2017-02-10 – 2017-02-11 (×2): 40 mg via ORAL
  Filled 2017-02-09 (×2): qty 1

## 2017-02-09 MED ORDER — METOCLOPRAMIDE HCL 5 MG/ML IJ SOLN: 10.0000 mg | Freq: Once | INTRAMUSCULAR | Status: DC | PRN

## 2017-02-09 MED ORDER — 0.9 % SODIUM CHLORIDE (POUR BTL) OPTIME
TOPICAL | Status: DC | PRN
  Administered 2017-02-09: 1000 mL

## 2017-02-09 MED ORDER — PROPOFOL 10 MG/ML IV BOLUS
INTRAVENOUS | Status: AC
  Filled 2017-02-09: qty 60

## 2017-02-09 MED ORDER — EZETIMIBE 10 MG PO TABS
10.0000 mg | ORAL_TABLET | Freq: Every day | ORAL | Status: DC
  Administered 2017-02-10 – 2017-02-11 (×2): 10 mg via ORAL
  Filled 2017-02-09 (×2): qty 1

## 2017-02-09 MED ORDER — FENTANYL CITRATE (PF) 100 MCG/2ML IJ SOLN
50.0000 ug | INTRAMUSCULAR | Status: DC
  Administered 2017-02-09: 100 ug via INTRAVENOUS

## 2017-02-09 MED ORDER — DEXAMETHASONE SODIUM PHOSPHATE 10 MG/ML IJ SOLN
10.0000 mg | Freq: Once | INTRAMUSCULAR | Status: AC
  Administered 2017-02-09: 10 mg via INTRAVENOUS

## 2017-02-09 MED ORDER — VITAMIN D 1000 UNITS PO TABS
2000.0000 [IU] | ORAL_TABLET | Freq: Every day | ORAL | Status: DC
  Administered 2017-02-09 – 2017-02-10 (×2): 2000 [IU] via ORAL
  Filled 2017-02-09 (×2): qty 2

## 2017-02-09 MED ORDER — MEPERIDINE HCL 50 MG/ML IJ SOLN: 6.2500 mg | INTRAMUSCULAR | Status: DC | PRN

## 2017-02-09 MED ORDER — TRANEXAMIC ACID 1000 MG/10ML IV SOLN
1000.0000 mg | Freq: Once | INTRAVENOUS | Status: AC
  Administered 2017-02-09: 1000 mg via INTRAVENOUS
  Filled 2017-02-09: qty 1100

## 2017-02-09 SURGICAL SUPPLY — 42 items
BAG ZIPLOCK 12X15 (MISCELLANEOUS) IMPLANT
BANDAGE ACE 6X5 VEL STRL LF (GAUZE/BANDAGES/DRESSINGS) ×2 IMPLANT
BLADE SAW SGTL 11.0X1.19X90.0M (BLADE) IMPLANT
BLADE SAW SGTL 13.0X1.19X90.0M (BLADE) ×2 IMPLANT
BOWL SMART MIX CTS (DISPOSABLE) ×2 IMPLANT
CAPT KNEE TOTAL 3 ATTUNE ×2 IMPLANT
CEMENT HV SMART SET (Cement) ×4 IMPLANT
COVER SURGICAL LIGHT HANDLE (MISCELLANEOUS) ×2 IMPLANT
CUFF TOURN SGL QUICK 34 (TOURNIQUET CUFF) ×1
CUFF TRNQT CYL 34X4X40X1 (TOURNIQUET CUFF) ×1 IMPLANT
DECANTER SPIKE VIAL GLASS SM (MISCELLANEOUS) ×2 IMPLANT
DERMABOND ADVANCED (GAUZE/BANDAGES/DRESSINGS) ×1
DERMABOND ADVANCED .7 DNX12 (GAUZE/BANDAGES/DRESSINGS) ×1 IMPLANT
DRAPE U-SHAPE 47X51 STRL (DRAPES) ×2 IMPLANT
DRSG AQUACEL AG ADV 3.5X10 (GAUZE/BANDAGES/DRESSINGS) ×2 IMPLANT
DURAPREP 26ML APPLICATOR (WOUND CARE) ×4 IMPLANT
ELECT REM PT RETURN 15FT ADLT (MISCELLANEOUS) ×2 IMPLANT
GLOVE BIOGEL M 7.0 STRL (GLOVE) ×4 IMPLANT
GLOVE BIOGEL PI IND STRL 7.5 (GLOVE) ×4 IMPLANT
GLOVE BIOGEL PI IND STRL 8.5 (GLOVE) ×1 IMPLANT
GLOVE BIOGEL PI INDICATOR 7.5 (GLOVE) ×4
GLOVE BIOGEL PI INDICATOR 8.5 (GLOVE) ×1
GLOVE ECLIPSE 8.0 STRL XLNG CF (GLOVE) ×2 IMPLANT
GLOVE ORTHO TXT STRL SZ7.5 (GLOVE) ×4 IMPLANT
GOWN STRL REUS W/TWL LRG LVL3 (GOWN DISPOSABLE) ×8 IMPLANT
GOWN STRL REUS W/TWL XL LVL3 (GOWN DISPOSABLE) ×2 IMPLANT
HANDPIECE INTERPULSE COAX TIP (DISPOSABLE) ×1
MANIFOLD NEPTUNE II (INSTRUMENTS) ×2 IMPLANT
PACK TOTAL KNEE CUSTOM (KITS) ×2 IMPLANT
POSITIONER SURGICAL ARM (MISCELLANEOUS) ×2 IMPLANT
SET HNDPC FAN SPRY TIP SCT (DISPOSABLE) ×1 IMPLANT
SET PAD KNEE POSITIONER (MISCELLANEOUS) ×2 IMPLANT
SUT MNCRL AB 4-0 PS2 18 (SUTURE) ×2 IMPLANT
SUT STRATAFIX 0 PDS 27 VIOLET (SUTURE) ×2
SUT VIC AB 1 CT1 36 (SUTURE) ×6 IMPLANT
SUT VIC AB 2-0 CT1 27 (SUTURE) ×2
SUT VIC AB 2-0 CT1 TAPERPNT 27 (SUTURE) ×2 IMPLANT
SUTURE STRATFX 0 PDS 27 VIOLET (SUTURE) ×1 IMPLANT
SYR 50ML LL SCALE MARK (SYRINGE) ×2 IMPLANT
TRAY FOLEY W/METER SILVER 16FR (SET/KITS/TRAYS/PACK) ×2 IMPLANT
WRAP KNEE MAXI GEL POST OP (GAUZE/BANDAGES/DRESSINGS) ×2 IMPLANT
YANKAUER SUCT BULB TIP 10FT TU (MISCELLANEOUS) ×2 IMPLANT

## 2017-02-09 NOTE — Anesthesia Procedure Notes (Signed)
Anesthesia Regional Block: Adductor canal block   Pre-Anesthetic Checklist: ,, timeout performed, Correct Patient, Correct Site, Correct Laterality, Correct Procedure, Correct Position, site marked, Risks and benefits discussed,  Surgical consent,  Pre-op evaluation,  At surgeon's request and post-op pain management  Laterality: Left and Lower  Prep: Maximum Sterile Barrier Precautions used, chloraprep       Needles:  Injection technique: Single-shot  Needle Type: Echogenic Stimulator Needle     Needle Length: 10cm      Additional Needles:   Procedures:,,,, ultrasound used (permanent image in chart),,,,  Narrative:  Start time: 02/09/2017 10:12 AM End time: 02/09/2017 10:19 AM Injection made incrementally with aspirations every 5 mL.  Performed by: Personally  Anesthesiologist: [name], MD  Additional Notes: Risks, benefits and alternative to block explained extensively.  Patient tolerated procedure well, without complications.

## 2017-02-09 NOTE — Discharge Instructions (Signed)
Information on my medicine - XARELTO® (Rivaroxaban) ° ° °Why was Xarelto® prescribed for you? °Xarelto® was prescribed for you to reduce the risk of blood clots forming after orthopedic surgery. The medical term for these abnormal blood clots is venous thromboembolism (VTE). ° °What do you need to know about xarelto® ? °Take your Xarelto® ONCE DAILY at the same time every day. °You may take it either with or without food. ° °If you have difficulty swallowing the tablet whole, you may crush it and mix in applesauce just prior to taking your dose. ° °Take Xarelto® exactly as prescribed by your doctor and DO NOT stop taking Xarelto® without talking to the doctor who prescribed the medication.  Stopping without other VTE prevention medication to take the place of Xarelto® may increase your risk of developing a clot. ° °After discharge, you should have regular check-up appointments with your healthcare provider that is prescribing your Xarelto®.   ° °What do you do if you miss a dose? °If you miss a dose, take it as soon as you remember on the same day then continue your regularly scheduled once daily regimen the next day. Do not take two doses of Xarelto® on the same day.  ° °Important Safety Information °A possible side effect of Xarelto® is bleeding. You should call your healthcare provider right away if you experience any of the following: °? Bleeding from an injury or your nose that does not stop. °? Unusual colored urine (red or dark brown) or unusual colored stools (red or black). °? Unusual bruising for unknown reasons. °? A serious fall or if you hit your head (even if there is no bleeding). ° °Some medicines may interact with Xarelto® and might increase your risk of bleeding while on Xarelto®. To help avoid this, consult your healthcare provider or pharmacist prior to using any new prescription or non-prescription medications, including herbals, vitamins, non-steroidal anti-inflammatory drugs (NSAIDs) and  supplements. ° °This website has more information on Xarelto®: www.xarelto.com. ° °___________________________________________________________________________________ ° °INSTRUCTIONS AFTER JOINT REPLACEMENT  ° °o Remove items at home which could result in a fall. This includes throw rugs or furniture in walking pathways °o ICE to the affected joint every three hours while awake for 30 minutes at a time, for at least the first 3-5 days, and then as needed for pain and swelling.  Continue to use ice for pain and swelling. You may notice swelling that will progress down to the foot and ankle.  This is normal after surgery.  Elevate your leg when you are not up walking on it.   °o Continue to use the breathing machine you got in the hospital (incentive spirometer) which will help keep your temperature down.  It is common for your temperature to cycle up and down following surgery, especially at night when you are not up moving around and exerting yourself.  The breathing machine keeps your lungs expanded and your temperature down. ° ° °DIET:  As you were doing prior to hospitalization, we recommend a well-balanced diet. ° °DRESSING / WOUND CARE / SHOWERING ° °Keep the surgical dressing until follow up.  The dressing is water proof, so you can shower without any extra covering.  IF THE DRESSING FALLS OFF or the wound gets wet inside, change the dressing with sterile gauze.  Please use good hand washing techniques before changing the dressing.  Do not use any lotions or creams on the incision until instructed by your surgeon.   ° °ACTIVITY ° °o   Increase activity slowly as tolerated, but follow the weight bearing instructions below.   °o No driving for 6 weeks or until further direction given by your physician.  You cannot drive while taking narcotics.  °o No lifting or carrying greater than 10 lbs. until further directed by your surgeon. °o Avoid periods of inactivity such as sitting longer than an hour when not asleep.  This helps prevent blood clots.  °o You may return to work once you are authorized by your doctor.  ° ° ° °WEIGHT BEARING  ° °Weight bearing as tolerated with assist device (walker, cane, etc) as directed, use it as long as suggested by your surgeon or therapist, typically at least 4-6 weeks. ° ° °EXERCISES ° °Results after joint replacement surgery are often greatly improved when you follow the exercise, range of motion and muscle strengthening exercises prescribed by your doctor. Safety measures are also important to protect the joint from further injury. Any time any of these exercises cause you to have increased pain or swelling, decrease what you are doing until you are comfortable again and then slowly increase them. If you have problems or questions, call your caregiver or physical therapist for advice.  ° °Rehabilitation is important following a joint replacement. After just a few days of immobilization, the muscles of the leg can become weakened and shrink (atrophy).  These exercises are designed to build up the tone and strength of the thigh and leg muscles and to improve motion. Often times heat used for twenty to thirty minutes before working out will loosen up your tissues and help with improving the range of motion but do not use heat for the first two weeks following surgery (sometimes heat can increase post-operative swelling).  ° °These exercises can be done on a training (exercise) mat, on the floor, on a table or on a bed. Use whatever works the best and is most comfortable for you.    Use music or television while you are exercising so that the exercises are a pleasant break in your day. This will make your life better with the exercises acting as a break in your routine that you can look forward to.   Perform all exercises about fifteen times, three times per day or as directed.  You should exercise both the operative leg and the other leg as well. ° °Exercises include: °  °• Quad Sets - Tighten  up the muscle on the front of the thigh (Quad) and hold for 5-10 seconds.   °• Straight Leg Raises - With your knee straight (if you were given a brace, keep it on), lift the leg to 60 degrees, hold for 3 seconds, and slowly lower the leg.  Perform this exercise against resistance later as your leg gets stronger.  °• Leg Slides: Lying on your back, slowly slide your foot toward your buttocks, bending your knee up off the floor (only go as far as is comfortable). Then slowly slide your foot back down until your leg is flat on the floor again.  °• Angel Wings: Lying on your back spread your legs to the side as far apart as you can without causing discomfort.  °• Hamstring Strength:  Lying on your back, push your heel against the floor with your leg straight by tightening up the muscles of your buttocks.  Repeat, but this time bend your knee to a comfortable angle, and push your heel against the floor.  You may put a pillow under the heel   to make it more comfortable if necessary.  ° °A rehabilitation program following joint replacement surgery can speed recovery and prevent re-injury in the future due to weakened muscles. Contact your doctor or a physical therapist for more information on knee rehabilitation.  ° ° °CONSTIPATION ° °Constipation is defined medically as fewer than three stools per week and severe constipation as less than one stool per week.  Even if you have a regular bowel pattern at home, your normal regimen is likely to be disrupted due to multiple reasons following surgery.  Combination of anesthesia, postoperative narcotics, change in appetite and fluid intake all can affect your bowels.  ° °YOU MUST use at least one of the following options; they are listed in order of increasing strength to get the job done.  They are all available over the counter, and you may need to use some, POSSIBLY even all of these options:   ° °Drink plenty of fluids (prune juice may be helpful) and high fiber  foods °Colace 100 mg by mouth twice a day  °Senokot for constipation as directed and as needed Dulcolax (bisacodyl), take with full glass of water  °Miralax (polyethylene glycol) once or twice a day as needed. ° °If you have tried all these things and are unable to have a bowel movement in the first 3-4 days after surgery call either your surgeon or your primary doctor.   ° °If you experience loose stools or diarrhea, hold the medications until you stool forms back up.  If your symptoms do not get better within 1 week or if they get worse, check with your doctor.  If you experience "the worst abdominal pain ever" or develop nausea or vomiting, please contact the office immediately for further recommendations for treatment. ° ° °ITCHING:  If you experience itching with your medications, try taking only a single pain pill, or even half a pain pill at a time.  You can also use Benadryl over the counter for itching or also to help with sleep.  ° °TED HOSE STOCKINGS:  Use stockings on both legs until for at least 2 weeks or as directed by physician office. They may be removed at night for sleeping. ° °MEDICATIONS:  See your medication summary on the “After Visit Summary” that nursing will review with you.  You may have some home medications which will be placed on hold until you complete the course of blood thinner medication.  It is important for you to complete the blood thinner medication as prescribed. ° °PRECAUTIONS:  If you experience chest pain or shortness of breath - call 911 immediately for transfer to the hospital emergency department.  ° °If you develop a fever greater that 101 F, purulent drainage from wound, increased redness or drainage from wound, foul odor from the wound/dressing, or calf pain - CONTACT YOUR SURGEON.   °                                                °FOLLOW-UP APPOINTMENTS:  If you do not already have a post-op appointment, please call the office for an appointment to be seen by your  surgeon.  Guidelines for how soon to be seen are listed in your “After Visit Summary”, but are typically between 1-4 weeks after surgery. ° °OTHER INSTRUCTIONS:  ° °Knee Replacement:  Do not place pillow under   knee, focus on keeping the knee straight while resting.  ° °MAKE SURE YOU:  °• Understand these instructions.  °• Get help right away if you are not doing well or get worse.  ° ° °Thank you for letting us be a part of your medical care team.  It is a privilege we respect greatly.  We hope these instructions will help you stay on track for a fast and full recovery!  °  °

## 2017-02-09 NOTE — Anesthesia Procedure Notes (Signed)
Procedure Name: MAC Date/Time: 02/09/2017 10:28 AM Performed by: Lissa Morales, CRNA Pre-anesthesia Checklist: Patient identified, Emergency Drugs available, Suction available, Patient being monitored and Timeout performed Patient Re-evaluated:Patient Re-evaluated prior to induction Oxygen Delivery Method: Simple face mask Placement Confirmation: positive ETCO2 Dental Injury: Teeth and Oropharynx as per pre-operative assessment

## 2017-02-09 NOTE — Anesthesia Procedure Notes (Deleted)
Performed by: Cayson Kalb E, CRNA       

## 2017-02-09 NOTE — Anesthesia Preprocedure Evaluation (Signed)
Anesthesia Evaluation  Patient identified by MRN, date of birth, ID band Patient awake    Reviewed: Allergy & Precautions, NPO status , Patient's Chart, lab work & pertinent test results  Airway Mallampati: II  TM Distance: >3 FB Neck ROM: Full    Dental no notable dental hx.    Pulmonary COPD,  COPD inhaler,    Pulmonary exam normal breath sounds clear to auscultation       Cardiovascular hypertension, Normal cardiovascular exam+ dysrhythmias Atrial Fibrillation  Rhythm:Regular Rate:Normal     Neuro/Psych negative neurological ROS  negative psych ROS   GI/Hepatic Neg liver ROS, GERD  Medicated and Controlled,  Endo/Other  negative endocrine ROS  Renal/GU negative Renal ROS  negative genitourinary   Musculoskeletal negative musculoskeletal ROS (+)   Abdominal   Peds negative pediatric ROS (+)  Hematology negative hematology ROS (+)   Anesthesia Other Findings   Reproductive/Obstetrics negative OB ROS                             Anesthesia Physical Anesthesia Plan  ASA: II  Anesthesia Plan: Spinal   Post-op Pain Management:  Regional for Post-op pain   Induction: Intravenous  PONV Risk Score and Plan:   Airway Management Planned: Simple Face Mask  Additional Equipment:   Intra-op Plan:   Post-operative Plan:   Informed Consent: I have reviewed the patients History and Physical, chart, labs and discussed the procedure including the risks, benefits and alternatives for the proposed anesthesia with the patient or authorized representative who has indicated his/her understanding and acceptance.   Dental advisory given  Plan Discussed with: CRNA  Anesthesia Plan Comments:         Anesthesia Quick Evaluation

## 2017-02-09 NOTE — Evaluation (Signed)
Physical Therapy Evaluation Patient Details Name: Theodore [name] MRN: [id number] DOB: Sep 08, 1934 Today's Date: 02/09/2017   History of Present Illness  L TKA with excision of mass on thigh. H/O PAF, cardioversion, Left patella tendon rupture,  Clinical Impression  The patient reports numbness of the left heel,more so than right. Reports remainder of legs and buttocks are not numb. RN aware.  The patient expresses concern for Dc due to the wife will not be able to assist patient. Patient has 5 steps to enter. Patient will need to be very safety and mobile for Dc. Pt admitted with above diagnosis. Pt currently with functional limitations due to the deficits listed below (see PT Problem List).  Pt will benefit from skilled PT to increase their independence and safety with mobility to allow discharge to the venue listed below.       Follow Up Recommendations DC plan and follow up therapy as arranged by surgeon;Outpatient PT    Equipment Recommendations  Rolling walker with 5" wheels    Recommendations for Other Services       Precautions / Restrictions Precautions Precautions: Knee;Fall Restrictions Weight Bearing Restrictions: No      Mobility  Bed Mobility Overal bed mobility: Needs Assistance Bed Mobility: Supine to Sit;Sit to Supine     Supine to sit: Min assist;HOB elevated Sit to supine: Min assist;HOB elevated   General bed mobility comments: assist with left leg, use of rail,   Transfers Overall transfer level: Needs assistance Equipment used: Rolling walker (2 wheeled) Transfers: Sit to/from Stand Sit to Stand: Mod assist;+2 safety/equipment;From elevated surface         General transfer comment: Caution taken due to reports of left heel numbness > right. the patient did stand and take 3 side steps using RW.  Ambulation/Gait                Stairs            Wheelchair Mobility    Modified Rankin (Stroke Patients Only)       Balance                                              Pertinent Vitals/Pain Pain Assessment: 0-10 Pain Score: 2  Pain Location: left knee Pain Descriptors / Indicators: Discomfort;Aching;Throbbing Pain Intervention(s): Repositioned;Premedicated before session;Ice applied;Monitored during session;Limited activity within patient's tolerance    Home Living Family/patient expects to be discharged to:: Private residence Living Arrangements: Spouse/significant other Available Help at Discharge: Family Type of Home: House Home Access: Stairs to enter Entrance Stairs-Rails: Left Entrance Stairs-Number of Steps: 5 Home Layout: One level Home Equipment: None      Prior Function Level of Independence: Independent               Hand Dominance        Extremity/Trunk Assessment   Upper Extremity Assessment Upper Extremity Assessment: Defer to OT evaluation    Lower Extremity Assessment Lower Extremity Assessment: LLE deficits/detail LLE Deficits / Details: able to raise leg from bed, 50 degrees knee flexion, REPORTS LEFT HEEL IS NUMB COMPARED TO RIGHT.     Cervical / Trunk Assessment Cervical / Trunk Assessment: Normal  Communication   Communication: No difficulties  Cognition Arousal/Alertness: Awake/alert Behavior During Therapy: WFL for tasks assessed/performed Overall Cognitive Status: Within Functional Limits for tasks assessed  General Comments      Exercises     Assessment/Plan    PT Assessment Patient needs continued PT services  PT Problem List Decreased strength;Decreased range of motion;Decreased knowledge of use of DME;Decreased activity tolerance;Decreased safety awareness;Decreased knowledge of precautions;Decreased mobility;Impaired sensation       PT Treatment Interventions DME instruction;Gait training;Stair training;Functional mobility training;Therapeutic activities;Therapeutic  exercise;Patient/family education    PT Goals (Current goals can be found in the Care Plan section)  Acute Rehab PT Goals Patient Stated Goal: To go home but I am worried that my wife cannot assist me. PT Goal Formulation: With patient Time For Goal Achievement: 02/14/17 Potential to Achieve Goals: Good    Frequency 7X/week   Barriers to discharge Decreased caregiver support      Co-evaluation               AM-PAC PT "6 Clicks" Daily Activity  Outcome Measure Difficulty turning over in bed (including adjusting bedclothes, sheets and blankets)?: A Little Difficulty moving from lying on back to sitting on the side of the bed? : A Little Difficulty sitting down on and standing up from a chair with arms (e.g., wheelchair, bedside commode, etc,.)?: A Lot Help needed moving to and from a bed to chair (including a wheelchair)?: A Lot Help needed walking in hospital room?: A Lot Help needed climbing 3-5 steps with a railing? : Total 6 Click Score: 13    End of Session Equipment Utilized During Treatment: Gait belt Activity Tolerance: Patient tolerated treatment well Patient left: in bed;with call bell/phone within reach;with bed alarm set Nurse Communication: Mobility status PT Visit Diagnosis: Difficulty in walking, not elsewhere classified (R26.2);Pain Pain - Right/Left: Left Pain - part of body: Knee    Time: 4008-6761 PT Time Calculation (min) (ACUTE ONLY): 24 min   Charges:   PT Evaluation $PT Eval Low Complexity: 1 Low PT Treatments $Therapeutic Activity: 8-22 mins   PT G Codes:   PT G-Codes **NOT FOR INPATIENT CLASS** Functional Assessment Tool Used: AM-PAC 6 Clicks Basic Mobility;Clinical judgement Functional Limitation: Mobility: Walking and moving around Mobility: Walking and Moving Around Current Status (P5093): At least 40 percent but less than 60 percent impaired, limited or restricted Mobility: Walking and Moving Around Goal Status [phone]): At least 1  percent but less than 20 percent impaired, limited or restricted    G [facility] PT [phone]   Claretha Cooper 02/09/2017, 4:54 PM

## 2017-02-09 NOTE — Anesthesia Postprocedure Evaluation (Signed)
Anesthesia Post Note  Patient: Theodore [name]  Procedure(s) Performed: LEFT TOTAL KNEE ARTHROPLASTY, EXCISION LEFT DISTAL THIGH MASS (Left Knee)     Patient location during evaluation: PACU Anesthesia Type: Spinal Level of consciousness: awake and alert Pain management: pain level controlled Vital Signs Assessment: post-procedure vital signs reviewed and stable Respiratory status: spontaneous breathing and respiratory function stable Cardiovascular status: blood pressure returned to baseline and stable Postop Assessment: no headache, no backache, spinal receding and no apparent nausea or vomiting Anesthetic complications: no    Last Vitals:  Vitals:   02/09/17 1315 02/09/17 1330  BP: 137/75 137/81  Pulse: (!) 53 (!) 52  Resp: 17 16  Temp: 36.5 C 36.5 C  SpO2: 97% 98%    Last Pain:  Vitals:   02/09/17 1330  TempSrc: Oral  PainSc: 0-No pain                 [name]

## 2017-02-09 NOTE — Transfer of Care (Signed)
Immediate Anesthesia Transfer of Care Note  Patient: Theodore [name]  Procedure(s) Performed: LEFT TOTAL KNEE ARTHROPLASTY, EXCISION LEFT DISTAL THIGH MASS (Left Knee)  Patient Location: PACU  Anesthesia Type:Spinal  Level of Consciousness: awake, alert , oriented and patient cooperative  Airway & Oxygen Therapy: Patient Spontanous Breathing and Patient connected to face mask oxygen  Post-op Assessment: Report given to RN and Post -op Vital signs reviewed and stable  Post vital signs: stable  Last Vitals:  Vitals:   02/09/17 1017 02/09/17 1018  BP:    Pulse: (!) 54 (!) 55  Resp: 12 11  Temp:    SpO2: 100% 100%    Last Pain:  Vitals:   02/09/17 0843  TempSrc: Oral      Patients Stated Pain Goal: 4 (96/22/29 7989)  Complications: No apparent anesthesia complications

## 2017-02-09 NOTE — Op Note (Signed)
NAME:  Theodore [name]                      MEDICAL RECORD NO.:  [id number]                             FACILITY:  [facility]      PHYSICIAN:  Pietro Cassis. [name], M.D.  DATE OF BIRTH:  12/03/1934      DATE OF PROCEDURE:  02/09/2017                                     OPERATIVE REPORT         PREOPERATIVE DIAGNOSIS:  Left knee osteoarthritis.  #2.  Left distal thigh soft tissue mass, lipoma     POSTOPERATIVE DIAGNOSIS:  Left knee osteoarthritis.  #2.  Left distal thigh soft tissue mass, lipoma     FINDINGS:  The patient was noted to have complete loss of cartilage and   bone-on-bone arthritis with associated osteophytes in the medial and patellofemoral compartments of   the knee with a significant synovitis and associated effusion.      PROCEDURE:  Left total knee replacement.  #2.  Excision soft tissue mass     COMPONENTS USED:  DePuy Attune rotating platform posterior stabilized knee   system, a size 6 femur, 7 tibia, size 7 mm PS AOX insert, and 41 anatomic patellar   button.      SURGEON:  Pietro Cassis. [name], M.D.      ASSISTANT:  Danae Orleans, PA-C.      ANESTHESIA:  Regional and Spinal.      SPECIMENS:  None.      COMPLICATION:  None.      DRAINS:  None.  EBL: <100cc      TOURNIQUET TIME:   Total Tourniquet Time Documented: Thigh (Left) - 32 minutes Total: Thigh (Left) - 32 minutes  .      The patient was stable to the recovery room.      INDICATION FOR PROCEDURE:  Theodore [name] is a 82 y.o. male patient of   mine.  The patient had been seen, evaluated, and treated conservatively in the   office with medication, activity modification, and injections.  The patient had   radiographic changes of bone-on-bone arthritis with endplate sclerosis and osteophytes noted.      The patient failed conservative measures including medication, injections, and activity modification, and at this point was ready for more definitive measures.   Based on the radiographic changes and  failed conservative measures, the patient   decided to proceed with total knee replacement.  Risks of infection,   DVT, component failure, need for revision surgery, postop course, and   expectations were all   discussed and reviewed.  Consent was obtained for benefit of pain   relief.      PROCEDURE IN DETAIL:  The patient was brought to the operative theater.   Once adequate anesthesia, preoperative antibiotics, 1 gm of Vancomycin, 1 gm of Tranexamic Acid, and 10 mg of Decadron administered, the patient was positioned supine with the left thigh tourniquet placed.  The  left lower extremity was prepped and draped in sterile fashion.  A time-   out was performed identifying the patient, planned procedure, and   extremity.      The left lower extremity was  placed in the [facility] leg holder.  The leg was   exsanguinated, tourniquet elevated to 250 mmHg.  A midline incision was   made along the lines of his previous incision for quadricep tendon repair.  After incising the skin I performed a subcutaneous dissection medially where this mass was most palpable.  I dissected the subcutaneous fat from this skin layer down to the vastus medialis.  This accumulation of fat tissue was not significantly organized as I would expect in a lipoma.  Nonetheless the subcutaneous collection of fat was excised as a potential source is identified in the preoperative MRI.  It was identified that once this was removed that there was a significant thickening to the medial aspect of the quadricep tendon from history repair which also could correlate to what was palpable on exam clinically.  This was only minimally debrided throughout the case so as not to affect the integrity of the quadricep tendon.  After this portion of the case was carried out attention was now directed to the knee replacement.  A medial based arthrotomy was then made again identified with thickened and hypertrophied healed quadricep tendon particular the  medial side of the patella insertion.  Following initial   exposure, attention was first directed to the patella.  Precut   measurement was noted to be 25 mm.  I resected down to 14 mm and used a   41 patellar button to restore patellar height as well as cover the cut   surface.  Previously placed suture for the quadricept tendon repair was removed as necessary.     The lug holes were drilled and a metal shim was placed to protect the   patella from retractors and saw blades.      At this point, attention was now directed to the femur.  The femoral   canal was opened with a drill, irrigated to try to prevent fat emboli.  An   intramedullary rod was passed at 6 degrees valgus, 9 mm of bone was   resected off the distal femur.  Following this resection, the tibia was   subluxated anteriorly.  Using the extramedullary guide, 2 mm of bone was resected off   the proximal medial tibia.  We confirmed the gap would be   stable medially and laterally with a size 6 spacer block as well as confirmed   the cut was perpendicular in the coronal plane, checking with an alignment rod.      Once this was done, I sized the femur to be a size 6 in the anterior-   posterior dimension, chose a standard component based on medial and   lateral dimension.  The size 6 rotation block was then pinned in   position anterior referenced using the C-clamp to set rotation.  The   anterior, posterior, and  chamfer cuts were made without difficulty nor   notching making certain that I was along the anterior cortex to help   with flexion gap stability.      The final box cut was made off the lateral aspect of distal femur.      At this point, the tibia was sized to be a size 7, the size 7 tray was   then pinned in position through the medial third of the tubercle,   drilled, and keel punched.  Trial reduction was now carried with a 6 femur,  7 tibia, a size 6 then 7 mm PS insert, and the 41 anatomic patella botton.  The  knee was brought to   extension, full extension with good flexion stability with the patella   tracking through the trochlea without application of pressure.  Given   all these findings the femoral lug holes were drilled and then the trial components removed.  Final components were   opened and cement was mixed.  The knee was irrigated with normal saline   solution and pulse lavage.  The synovial lining was   then injected with 30 cc of 0.25% Marcaine with epinephrine and 1 cc of Toradol plus 30 cc of NS for a total of 61 cc.      The knee was irrigated.  Final implants were then cemented onto clean and   dried cut surfaces of bone with the knee brought to extension with a size 7 mm PS trial insert.      Once the cement had fully cured, the excess cement was removed   throughout the knee.  I confirmed I was satisfied with the range of   motion and stability, and the final size 7 mm PS AOX insert was chosen.  It was   placed into the knee.      The tourniquet had been let down at 32 minutes.  No significant   hemostasis required.  The   extensor mechanism was then reapproximated using #1 Vicryl and #0 Stratafix sutures with the knee   in flexion.  The   remaining wound was closed with 2-0 Vicryl and running 4-0 Monocryl.   The knee was cleaned, dried, dressed sterilely using Dermabond and   Aquacel dressing.  The patient was then   brought to recovery room in stable condition, tolerating the procedure   well.   Please note that Physician Assistant, Danae Orleans, PA-C, was present for the entirety of the case, and was utilized for pre-operative positioning, peri-operative retractor management, general facilitation of the procedure.  He was also utilized for primary wound closure at the end of the case.              Pietro Cassis [name], M.D.    02/09/2017 12:01 PM

## 2017-02-09 NOTE — Progress Notes (Signed)
AssistedDr. Carignan with left, ultrasound guided, adductor canal block. Side rails up, monitors on throughout procedure. See vital signs in flow sheet. Tolerated Procedure well.  

## 2017-02-09 NOTE — Anesthesia Procedure Notes (Signed)
Spinal  Patient location during procedure: OR End time: 02/09/2017 10:44 AM Staffing Anesthesiologist: [name], MD Performed: anesthesiologist  Preanesthetic Checklist Completed: patient identified, site marked, surgical consent, pre-op evaluation, timeout performed, IV checked, risks and benefits discussed and monitors and equipment checked Spinal Block Patient position: sitting Prep: DuraPrep Patient monitoring: heart rate, continuous pulse ox and blood pressure Approach: right paramedian Location: L3-4 Injection technique: single-shot Needle Needle type: Spinocan  Needle gauge: 22 G Needle length: 9 cm Additional Notes Expiration date of kit checked and confirmed. Patient tolerated procedure well, without complications.CRNA unable to  Place spinal . Procedure performed by  Dr. Marcell Barlow

## 2017-02-09 NOTE — Interval H&P Note (Signed)
History and Physical Interval Note:  02/09/2017 9:22 AM  Theodore [name]  has presented today for surgery, with the diagnosis of Left knee osteoarthritis, distal thigh mass  The various methods of treatment have been discussed with the patient and family. After consideration of risks, benefits and other options for treatment, the patient has consented to  Procedure(s) with comments: LEFT TOTAL KNEE ARTHROPLASTY, EXCISION LEFT DISTAL THIGH MASS (Left) - 90 mins as a surgical intervention .  The patient's history has been reviewed, patient examined, no change in status, stable for surgery.  I have reviewed the patient's chart and labs.  Questions were answered to the patient's satisfaction.     Mauri Pole

## 2017-02-09 NOTE — Progress Notes (Signed)
PHARMACIST - PHYSICIAN ORDER COMMUNICATION  CONCERNING: P&T Medication Policy on Herbal Medications  DESCRIPTION:  This patient's order for: Lysine  has been noted.  This product(s) is classified as an "herbal" or natural product. Due to a lack of definitive safety studies or FDA approval, nonstandard manufacturing practices, plus the potential risk of unknown drug-drug interactions while on inpatient medications, the Pharmacy and Therapeutics Committee does not permit the use of "herbal" or natural products of this type within [facility].   ACTION TAKEN: The pharmacy department is unable to verify this order at this time and your patient has been informed of this safety policy. Please reevaluate patient's clinical condition at discharge and address if the herbal or natural product(s) should be resumed at that time.  

## 2017-02-10 DIAGNOSIS — M1712 Unilateral primary osteoarthritis, left knee: Secondary | ICD-10-CM | POA: Diagnosis not present

## 2017-02-10 LAB — CBC
HEMATOCRIT: 35.6 % — AB (ref 39.0–52.0)
Hemoglobin: 12.1 g/dL — ABNORMAL LOW (ref 13.0–17.0)
MCH: 31.3 pg (ref 26.0–34.0)
MCHC: 34 g/dL (ref 30.0–36.0)
MCV: 92.2 fL (ref 78.0–100.0)
Platelets: 146 10*3/uL — ABNORMAL LOW (ref 150–400)
RBC: 3.86 MIL/uL — AB (ref 4.22–5.81)
RDW: 13.6 % (ref 11.5–15.5)
WBC: 15 10*3/uL — AB (ref 4.0–10.5)

## 2017-02-10 LAB — BASIC METABOLIC PANEL
ANION GAP: 7 (ref 5–15)
BUN: 16 mg/dL (ref 6–20)
CO2: 25 mmol/L (ref 22–32)
Calcium: 8.8 mg/dL — ABNORMAL LOW (ref 8.9–10.3)
Chloride: 100 mmol/L — ABNORMAL LOW (ref 101–111)
Creatinine, Ser: 1 mg/dL (ref 0.61–1.24)
GFR calc Af Amer: >60 mL/min (ref >60)
GFR calc non Af Amer: >60 mL/min (ref >60)
GLUCOSE: 143 mg/dL — AB (ref 65–99)
POTASSIUM: 4.3 mmol/L (ref 3.5–5.1)
Sodium: 132 mmol/L — ABNORMAL LOW (ref 135–145)

## 2017-02-10 LAB — ABO/RH: ABO/RH(D): A POS

## 2017-02-10 MED ORDER — PROMETHAZINE HCL 25 MG/ML IJ SOLN
6.2500 mg | Freq: Four times a day (QID) | INTRAMUSCULAR | Status: DC | PRN
Start: 2017-02-10 — End: 2017-02-11

## 2017-02-10 NOTE — Progress Notes (Signed)
Physical Therapy Treatment Patient Details Name: Theodore [name] MRN: [id number] DOB: 07/18/1934 Today's Date: 02/10/2017    History of Present Illness L TKA with excision of mass on thigh. H/O PAF, cardioversion, Left patella tendon rupture,    PT Comments    POD # 1 am session Pt dressed and eager to go home today.  Reports "no sleep last night".  Assisted with amb only 18 feet when pt became ill with nausea followed by vomit.  Assisted to recliner and reported to RN.   Follow Up Recommendations  DC plan and follow up therapy as arranged by surgeon;Outpatient PT     Equipment Recommendations  None recommended by PT    Recommendations for Other Services       Precautions / Restrictions Precautions Precautions: Knee;Fall Precaution Comments: instructed no pillow under knee Restrictions Weight Bearing Restrictions: No    Mobility  Bed Mobility Overal bed mobility: Needs Assistance Bed Mobility: Supine to Sit     Supine to sit: Min guard;Min assist     General bed mobility comments: assist L LE OOB  Transfers Overall transfer level: Needs assistance Equipment used: Rolling walker (2 wheeled) Transfers: Sit to/from Stand Sit to Stand: Min guard;Supervision         General transfer comment: 50% VC's on proper hand placement, safety with turns and turn completion prior to sit  Ambulation/Gait   Ambulation Distance (Feet): 18 Feet Assistive device: Rolling walker (2 wheeled) Gait Pattern/deviations: Step-to pattern;Step-through pattern;Decreased stance time - left Gait velocity: decreased   General Gait Details: limited distance due to nausea followed by vomiting   Stairs            Wheelchair Mobility    Modified Rankin (Stroke Patients Only)       Balance                                            Cognition Arousal/Alertness: Awake/alert Behavior During Therapy: WFL for tasks assessed/performed Overall Cognitive Status:  Within Functional Limits for tasks assessed                                        Exercises      General Comments        Pertinent Vitals/Pain Pain Assessment: 0-10 Pain Score: 2  Pain Location: left knee Pain Descriptors / Indicators: Operative site guarding;Discomfort Pain Intervention(s): Monitored during session;Repositioned;Premedicated before session;Ice applied    Home Living                      Prior Function            PT Goals (current goals can now be found in the care plan section) Progress towards PT goals: Progressing toward goals    Frequency    7X/week      PT Plan Current plan remains appropriate    Co-evaluation              AM-PAC PT "6 Clicks" Daily Activity  Outcome Measure  Difficulty turning over in bed (including adjusting bedclothes, sheets and blankets)?: A Little Difficulty moving from lying on back to sitting on the side of the bed? : A Little Difficulty sitting down on and standing up from a chair with arms (e.g., wheelchair,  bedside commode, etc,.)?: A Lot Help needed moving to and from a bed to chair (including a wheelchair)?: A Lot Help needed walking in hospital room?: A Lot Help needed climbing 3-5 steps with a railing? : Total 6 Click Score: 13    End of Session Equipment Utilized During Treatment: Gait belt Activity Tolerance: Patient limited by fatigue;Other (comment)(nausea followed by vomit) Patient left: in chair;with call bell/phone within reach Nurse Communication: Mobility status PT Visit Diagnosis: Difficulty in walking, not elsewhere classified (R26.2);Pain Pain - Right/Left: Left Pain - part of body: Knee     Time: 1610-9604 PT Time Calculation (min) (ACUTE ONLY): 16 min  Charges:  $Gait Training: 8-22 mins                    G Codes:       {Tamora Huneke  PTA  [facility]  Acute  Rehab Pager      [phone]

## 2017-02-10 NOTE — Progress Notes (Signed)
Patient ID: Theodore [name], male   DOB: Sep 20, 1934, 83 y.o.   MRN: [id number] Subjective: 1 Day Post-Op Procedure(s) (LRB): LEFT TOTAL KNEE ARTHROPLASTY, EXCISION LEFT DISTAL THIGH MASS (Left)    Patient reports pain as mild to moderate.  Not much sleep but ok. Ready to go home  Objective:   VITALS:   Vitals:   02/10/17 0213 02/10/17 0602  BP: 126/77 132/70  Pulse: 63 (!) 57  Resp: 15 16  Temp: 98.2 F (36.8 C) 98.2 F (36.8 C)  SpO2: 97% 98%    Neurovascular intact Incision: dressing C/D/I  LABS Recent Labs    02/10/17 0610  HGB 12.1*  HCT 35.6*  WBC 15.0*  PLT 146*    Recent Labs    02/10/17 0610  NA 132*  K 4.3  BUN 16  CREATININE 1.00  GLUCOSE 143*    No results for input(s): LABPT, INR in the last 72 hours.   Assessment/Plan: 1 Day Post-Op Procedure(s) (LRB): LEFT TOTAL KNEE ARTHROPLASTY, EXCISION LEFT DISTAL THIGH MASS (Left)   Advance diet Up with therapy  Home today outpt PT already set up Reviewed goals with therapy RTC in 2 weeks

## 2017-02-10 NOTE — Progress Notes (Signed)
Patient has now had several episodes of nausea and vomiting preceded by waves of weakness and lightheadedness. Patient reports feeling just extreme exhaustion. Patient is still DTV. Discussed POC with patient and family. All agree patient is not safe to d/c to home. Patient positioned for comfort in bed. IVF re-established to PIV. Will monitor for nausea, output, comfort and safety.

## 2017-02-10 NOTE — Evaluation (Signed)
Occupational Therapy Evaluation and Discharge Patient Details Name: Theodore [name] MRN: [id number] DOB: 1934/06/24 Today's Date: 02/10/2017    History of Present Illness L TKA with excision of mass on thigh. H/O PAF, cardioversion, Left patella tendon rupture,   Clinical Impression   Pt educated in compensatory strategies for LB bathing and dressing, safe footwear, transporting items safely using walker and shower transfer. Pt verbalizing understanding of all information. Pt experiencing nausea with standing at sink, RN aware.    Follow Up Recommendations  No OT follow up    Equipment Recommendations  None recommended by OT    Recommendations for Other Services       Precautions / Restrictions Precautions Precautions: Knee;Fall Restrictions Weight Bearing Restrictions: No      Mobility Bed Mobility               General bed mobility comments: pt in chair  Transfers Overall transfer level: Needs assistance Equipment used: Rolling walker (2 wheeled) Transfers: Sit to/from Stand Sit to Stand: Supervision         General transfer comment: increased time    Balance                                           ADL either performed or assessed with clinical judgement   ADL Overall ADL's : Needs assistance/impaired Eating/Feeding: Independent;Sitting   Grooming: Wash/dry hands;Standing;Supervision/safety   Upper Body Bathing: Set up;Sitting   Lower Body Bathing: Minimal assistance;Sitting/lateral leans Lower Body Bathing Details (indicate cue type and reason): recommended pt use long handled bath sponge Upper Body Dressing : Set up;Sitting   Lower Body Dressing: Minimal assistance;Sit to/from stand Lower Body Dressing Details (indicate cue type and reason): educated in compensatory strategies and safe footwear, pt will rely on family to assist with compression hose Toilet Transfer: Supervision/safety;Ambulation;RW Toilet Transfer Details  (indicate cue type and reason): pt is aware to use 3 in 1 to elevate toilet Toileting- Clothing Manipulation and Hygiene: Supervision/safety;Sit to/from Nurse, children's Details (indicate cue type and reason): educated in technique using walker, pt will use 3 in 1 as shower seat, son will assist with transfer   General ADL Comments: instructed pt in how to transport items safely with RW     Vision Baseline Vision/History: Wears glasses Wears Glasses: At all times Patient Visual Report: No change from baseline       Perception     Praxis      Pertinent Vitals/Pain Pain Assessment: Faces Faces Pain Scale: Hurts little more Pain Location: left knee Pain Descriptors / Indicators: Discomfort;Aching;Throbbing Pain Intervention(s): Monitored during session;Repositioned;Ice applied     Hand Dominance Right   Extremity/Trunk Assessment Upper Extremity Assessment Upper Extremity Assessment: Overall WFL for tasks assessed   Lower Extremity Assessment Lower Extremity Assessment: Defer to PT evaluation   Cervical / Trunk Assessment Cervical / Trunk Assessment: Normal   Communication Communication Communication: No difficulties   Cognition Arousal/Alertness: Awake/alert Behavior During Therapy: WFL for tasks assessed/performed Overall Cognitive Status: Within Functional Limits for tasks assessed                                     General Comments       Exercises     Shoulder Instructions  Home Living Family/patient expects to be discharged to:: Private residence Living Arrangements: Spouse/significant other Available Help at Discharge: Family Type of Home: House Home Access: Stairs to enter Technical brewer of Steps: 5 Entrance Stairs-Rails: Left Home Layout: One level     Bathroom Shower/Tub: Occupational psychologist: [place]: Bedside commode;Walker - 2 wheels          Prior  Functioning/Environment Level of Independence: Independent                 OT Problem List: Impaired balance (sitting and/or standing);Pain      OT Treatment/Interventions:      OT Goals(Current goals can be found in the care plan section) Acute Rehab OT Goals Patient Stated Goal: To go home but I am worried that my wife cannot assist me.  OT Frequency:     Barriers to D/C:            Co-evaluation              AM-PAC PT "6 Clicks" Daily Activity     Outcome Measure Help from another person eating meals?: None Help from another person taking care of personal grooming?: A Little Help from another person toileting, which includes using toliet, bedpan, or urinal?: A Little Help from another person bathing (including washing, rinsing, drying)?: A Little Help from another person to put on and taking off regular upper body clothing?: None Help from another person to put on and taking off regular lower body clothing?: A Little 6 Click Score: 20   End of Session Equipment Utilized During Treatment: Rolling walker;Gait belt  Activity Tolerance: Treatment limited secondary to medical complications (Comment)(pt limited by nausea) Patient left: in chair;with call bell/phone within reach  OT Visit Diagnosis: Unsteadiness on feet (R26.81);Other abnormalities of gait and mobility (R26.89);Pain                Time: 1057-1110 OT Time Calculation (min): 13 min Charges:  OT General Charges $OT Visit: 1 Visit OT Evaluation $OT Eval Low Complexity: 1 Low G-Codes: OT G-codes **NOT FOR INPATIENT CLASS** Functional Assessment Tool Used: Clinical judgement Functional Limitation: Self care Self Care Current Status (P4982): At least 20 percent but less than 40 percent impaired, limited or restricted Self Care Goal Status (M4158): At least 20 percent but less than 40 percent impaired, limited or restricted Self Care Discharge Status [id number]): At least 20 percent but less than 40 percent  impaired, limited or restricted   02/10/2017 Nestor Lewandowsky, OTR/L Pager: [place], [place]aze [place] 02/10/2017, 11:30 AM

## 2017-02-10 NOTE — Progress Notes (Signed)
Physical Therapy Treatment Patient Details Name: Theodore [name] MRN: [id number] DOB: 1934-10-05 Today's Date: 02/10/2017    History of Present Illness L TKA with excision of mass on thigh. H/O PAF, cardioversion, Left patella tendon rupture,    PT Comments    POD # 1 Pt still wants to go home today so assist from recliner to wc to carry him and son down to stairs.  Practice 4 steps using one rail and one crutch.  Son present for "hands on" instruction.  When pt attempts to walk part way back to his room, he starts to c/o ill feeling with nausea and again vomit.  Return to room and report to RN.   Pt has not met goals to safely D/C to home today.    Follow Up Recommendations  DC plan and follow up therapy as arranged by surgeon;Outpatient PT     Equipment Recommendations  None recommended by PT  Issued one crutch for the stairs  Recommendations for Other Services       Precautions / Restrictions Precautions Precautions: Knee;Fall Precaution Comments: instructed no pillow under knee Restrictions Weight Bearing Restrictions: No    Mobility  Bed Mobility   General bed mobility comments: OOB in recliner  Transfers Overall transfer level: Needs assistance Equipment used: Rolling walker (2 wheeled) Transfers: Sit to/from Stand Sit to Stand: Min guard;Supervision         General transfer comment: 50% VC's on proper hand placement, safety with turns and turn completion prior to sit    assisted from recliner to wheelchair  Ambulation/Gait   Ambulation Distance (Feet): 22 Feet Assistive device: Rolling walker (2 wheeled) Gait Pattern/deviations: Step-to pattern;Step-through pattern;Decreased stance time - left Gait velocity: decreased   General Gait Details: limited distance due to nausea followed by vomiting a second time     son present   RN notified   Stairs Stairs: Yes   Stair Management: One rail Left;Forwards;Step to pattern;With crutches Number of Stairs:  4 General stair comments: with sone present for "hands on" instruction, practiced 4 steps using one rail and one crutch and 50% VC's on proper sequencing and safety.  Wheelchair Mobility    Modified Rankin (Stroke Patients Only)       Balance                                            Cognition Arousal/Alertness: Awake/alert Behavior During Therapy: WFL for tasks assessed/performed Overall Cognitive Status: Within Functional Limits for tasks assessed                                        Exercises      General Comments        Pertinent Vitals/Pain Pain Assessment: 0-10 Pain Score: 2  Pain Location: left knee Pain Descriptors / Indicators: Operative site guarding;Discomfort Pain Intervention(s): Monitored during session;Repositioned;Premedicated before session;Ice applied    Home Living                      Prior Function            PT Goals (current goals can now be found in the care plan section) Progress towards PT goals: Progressing toward goals    Frequency    7X/week  PT Plan Current plan remains appropriate    Co-evaluation              AM-PAC PT "6 Clicks" Daily Activity  Outcome Measure  Difficulty turning over in bed (including adjusting bedclothes, sheets and blankets)?: A Little Difficulty moving from lying on back to sitting on the side of the bed? : A Little Difficulty sitting down on and standing up from a chair with arms (e.g., wheelchair, bedside commode, etc,.)?: A Lot Help needed moving to and from a bed to chair (including a wheelchair)?: A Lot Help needed walking in hospital room?: A Lot Help needed climbing 3-5 steps with a railing? : Total 6 Click Score: 13    End of Session Equipment Utilized During Treatment: Gait belt Activity Tolerance: Patient limited by fatigue;Other (comment)(nausea followed by vomit) Patient left: in chair;with call bell/phone within reach Nurse  Communication: Mobility status PT Visit Diagnosis: Difficulty in walking, not elsewhere classified (R26.2);Pain Pain - Right/Left: Left Pain - part of body: Knee     Time: 4665-9935 PT Time Calculation (min) (ACUTE ONLY): 23 min  Charges:  $Gait Training: 8-22 mins $Therapeutic Activity: 8-22 mins                    G Codes:       Rica Koyanagi  PTA [facility]  Acute  Rehab Pager      [phone]

## 2017-02-11 DIAGNOSIS — M1712 Unilateral primary osteoarthritis, left knee: Secondary | ICD-10-CM | POA: Diagnosis not present

## 2017-02-11 MED ORDER — TRAMADOL HCL 50 MG PO TABS: 50.0000 mg | ORAL_TABLET | Freq: Four times a day (QID) | ORAL | 0 refills | Status: DC | PRN

## 2017-02-11 MED ORDER — ACETAMINOPHEN 500 MG PO TABS
1000.0000 mg | ORAL_TABLET | Freq: Four times a day (QID) | ORAL | Status: DC
  Administered 2017-02-11: 08:00:00 1000 mg via ORAL
  Filled 2017-02-11: qty 2

## 2017-02-11 MED ORDER — ACETAMINOPHEN 500 MG PO TABS: 1000.0000 mg | ORAL_TABLET | Freq: Four times a day (QID) | ORAL | 0 refills | Status: DC

## 2017-02-11 MED ORDER — TRAMADOL HCL 50 MG PO TABS
50.0000 mg | ORAL_TABLET | Freq: Four times a day (QID) | ORAL | Status: DC
Start: 2017-02-11 — End: 2017-02-11
  Administered 2017-02-11: 08:00:00 100 mg via ORAL
  Filled 2017-02-11: qty 2

## 2017-02-11 MED ORDER — ONDANSETRON HCL 4 MG PO TABS: 4.0000 mg | ORAL_TABLET | Freq: Four times a day (QID) | ORAL | 0 refills | Status: DC | PRN

## 2017-02-11 MED ORDER — METHOCARBAMOL 500 MG PO TABS: 500.0000 mg | ORAL_TABLET | Freq: Four times a day (QID) | ORAL | 0 refills | Status: DC | PRN

## 2017-02-11 NOTE — Progress Notes (Signed)
Physical Therapy Treatment Patient Details Name: Theodore [name] MRN: [id number] DOB: Jul 05, 1934 Today's Date: 02/11/2017    History of Present Illness L TKA with excision of mass on thigh. H/O PAF, cardioversion, Left patella tendon rupture,    PT Comments    POD # 2 am session Pt feeling much better.  Pt got more sleep last night and pain meds have been changed.  Son present during session.  Assisted with amb a greater distance, practiced stairs and performed all TKR TE following handout HEP.  Instructed on proper tech, freq as well as use of ICE.    Follow Up Recommendations  DC plan and follow up therapy as arranged by surgeon;Outpatient PT     Equipment Recommendations  None recommended by PT    Recommendations for Other Services       Precautions / Restrictions Precautions Precautions: Knee;Fall Precaution Comments: instructed no pillow under knee Restrictions Weight Bearing Restrictions: No    Mobility  Bed Mobility Overal bed mobility: Needs Assistance Bed Mobility: Supine to Sit     Supine to sit: Min guard;Min assist     General bed mobility comments: assist L LE  Transfers Overall transfer level: Needs assistance Equipment used: Rolling walker (2 wheeled) Transfers: Sit to/from Stand Sit to Stand: Supervision;Min guard         General transfer comment: <25% Vc's on proper hand placement and safety with turns  Ambulation/Gait Ambulation/Gait assistance: Supervision;Min guard Ambulation Distance (Feet): 65 Feet Assistive device: Rolling walker (2 wheeled) Gait Pattern/deviations: Step-to pattern;Step-through pattern;Decreased stance time - left Gait velocity: decreased   General Gait Details: tolerated an increased distance with no c/o nausea "feels better"   son present and "hands on" assisted    Stairs Stairs: Yes   Stair Management: One rail Left;Forwards;Step to pattern;With crutches Number of Stairs: 4 General stair comments: with sone  present for "hands on" instruction, practiced 4 steps using one rail and one crutch and 50% VC's on proper sequencing and safety.  Wheelchair Mobility    Modified Rankin (Stroke Patients Only)       Balance                                            Cognition Arousal/Alertness: Awake/alert Behavior During Therapy: WFL for tasks assessed/performed Overall Cognitive Status: Within Functional Limits for tasks assessed                                        Exercises   Total Knee Replacement TE's 10 reps B LE ankle pumps 10 reps towel squeezes 10 reps knee presses 10 reps heel slides  10 reps SAQ's 10 reps SLR's 10 reps ABD Followed by ICE    General Comments        Pertinent Vitals/Pain Pain Assessment: 0-10 Pain Score: 7  Pain Location: left knee Pain Descriptors / Indicators: Operative site guarding;Discomfort;Grimacing Pain Intervention(s): Limited activity within patient's tolerance;Monitored during session;Repositioned;Ice applied    Home Living                      Prior Function            PT Goals (current goals can now be found in the care plan section) Progress towards PT goals: Progressing toward  goals    Frequency    7X/week      PT Plan Current plan remains appropriate    Co-evaluation              AM-PAC PT "6 Clicks" Daily Activity  Outcome Measure  Difficulty turning over in bed (including adjusting bedclothes, sheets and blankets)?: A Little Difficulty moving from lying on back to sitting on the side of the bed? : A Little Difficulty sitting down on and standing up from a chair with arms (e.g., wheelchair, bedside commode, etc,.)?: A Lot Help needed moving to and from a bed to chair (including a wheelchair)?: A Lot Help needed walking in hospital room?: A Lot Help needed climbing 3-5 steps with a railing? : Total 6 Click Score: 13    End of Session Equipment Utilized During  Treatment: Gait belt Activity Tolerance: Patient tolerated treatment well Patient left: in chair;with call bell/phone within reach Nurse Communication: Mobility status(pt ready to D/C to home) PT Visit Diagnosis: Difficulty in walking, not elsewhere classified (R26.2);Pain Pain - Right/Left: Left Pain - part of body: Knee     Time: 0926-1007 PT Time Calculation (min) (ACUTE ONLY): 41 min  Charges:  $Gait Training: 8-22 mins $Therapeutic Exercise: 8-22 mins $Therapeutic Activity: 8-22 mins                    G Codes:       Rica Koyanagi  PTA [facility]  Acute  Rehab Pager      [phone]

## 2017-02-11 NOTE — Plan of Care (Signed)
Plan of care reviewed and discussed with patient.   

## 2017-02-11 NOTE — Progress Notes (Signed)
Patient ID: Theodore [name], male   DOB: 02/21/1934, 83 y.o.   MRN: [id number] Subjective: 2 Days Post-Op Procedure(s) (LRB): LEFT TOTAL KNEE ARTHROPLASTY, EXCISION LEFT DISTAL THIGH MASS (Left)    Patient reports pain as mild to moderate. A lot of PONV yesterday maybe related to hydrocodone Up eating breakfast this am Feels better but didn't take narcotics last night  Objective:   VITALS:   Vitals:   02/10/17 2204 02/11/17 0627  BP: 124/63 134/69  Pulse: 67 60  Resp: 15 16  Temp: 98.5 F (36.9 C) 98.6 F (37 C)  SpO2: 94% 95%    Neurovascular intact Incision: dressing C/D/I  LABS Recent Labs    02/10/17 0610  HGB 12.1*  HCT 35.6*  WBC 15.0*  PLT 146*    Recent Labs    02/10/17 0610  NA 132*  K 4.3  BUN 16  CREATININE 1.00  GLUCOSE 143*    No results for input(s): LABPT, INR in the last 72 hours.   Assessment/Plan: 2 Days Post-Op Procedure(s) (LRB): LEFT TOTAL KNEE ARTHROPLASTY, EXCISION LEFT DISTAL THIGH MASS (Left)   Advance diet Up with therapy  Change to Tramadol to see how it is tolerated If tolerates new pain meds with less PONV then home this am after PT

## 2017-02-13 ENCOUNTER — Telehealth: Payer: Self-pay | Admitting: *Deleted

## 2017-02-13 ENCOUNTER — Emergency Department (HOSPITAL_COMMUNITY)
Admission: EM | Admit: 2017-02-13 | Discharge: 2017-02-13 | Disposition: A | Discharge status: Home / Self Care | Payer: Medicare Other | Attending: Emergency Medicine | Admitting: Emergency Medicine

## 2017-02-13 DIAGNOSIS — L7622 Postprocedural hemorrhage and hematoma of skin and subcutaneous tissue following other procedure: Secondary | ICD-10-CM | POA: Diagnosis present

## 2017-02-13 DIAGNOSIS — Z79899 Other long term (current) drug therapy: Secondary | ICD-10-CM | POA: Insufficient documentation

## 2017-02-13 DIAGNOSIS — J449 Chronic obstructive pulmonary disease, unspecified: Secondary | ICD-10-CM | POA: Diagnosis not present

## 2017-02-13 DIAGNOSIS — Z7901 Long term (current) use of anticoagulants: Secondary | ICD-10-CM

## 2017-02-13 DIAGNOSIS — I119 Hypertensive heart disease without heart failure: Secondary | ICD-10-CM | POA: Insufficient documentation

## 2017-02-13 DIAGNOSIS — Z96652 Presence of left artificial knee joint: Secondary | ICD-10-CM | POA: Insufficient documentation

## 2017-02-13 DIAGNOSIS — G8929 Other chronic pain: Secondary | ICD-10-CM | POA: Insufficient documentation

## 2017-02-13 DIAGNOSIS — M25562 Pain in left knee: Secondary | ICD-10-CM

## 2017-02-13 DIAGNOSIS — Z880 Allergy status to penicillin: Secondary | ICD-10-CM | POA: Insufficient documentation

## 2017-02-13 DIAGNOSIS — I480 Paroxysmal atrial fibrillation: Secondary | ICD-10-CM | POA: Insufficient documentation

## 2017-02-13 IMAGING — NM NM MISC PROCEDURE
6 series · 36 of 36 positions shown · non-contrast
Comparison: none

[Series 1: wbr rest · 6.40mm/px · 6 of 64 frames shown]
[frame 6/64]
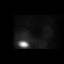
[frame 16/64]
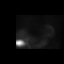
[frame 27/64]
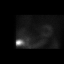
[frame 38/64]
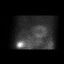
[frame 48/64]
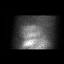
[frame 59/64]
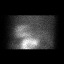

[Series 1: wbr_r-proj_st wbr rest · 6.40mm/px · 6 of 64 frames shown]
[frame 6/64]
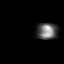
[frame 16/64]
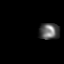
[frame 27/64]
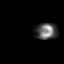
[frame 38/64]
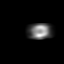
[frame 48/64]
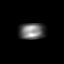
[frame 59/64]
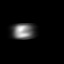

[Series 2: wbr stress-gsp · 6.40mm/px · 6 of 512 frames shown]
[frame 43/512]
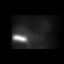
[frame 128/512]
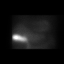
[frame 214/512]
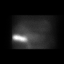
[frame 299/512]
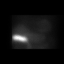
[frame 384/512]
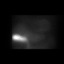
[frame 470/512]
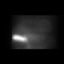

[Series 2: wbr_s-proj_st wbr stress-gsp · 6.40mm/px · 6 of 512 frames shown]
[frame 43/512]
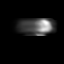
[frame 128/512]
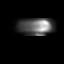
[frame 214/512]
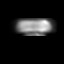
[frame 299/512]
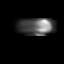
[frame 384/512]
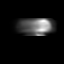
[frame 470/512]
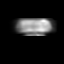

[Series 3: wbr_s-proj_st wbr stress-sum-em · 6.40mm/px · 6 of 64 frames shown]
[frame 6/64]
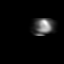
[frame 16/64]
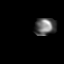
[frame 27/64]
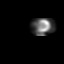
[frame 38/64]
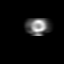
[frame 48/64]
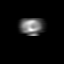
[frame 59/64]
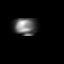

[Series 3: wbr stress-sum-em · 6.40mm/px · 6 of 64 frames shown]
[frame 6/64]
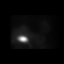
[frame 16/64]
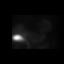
[frame 27/64]
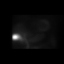
[frame 38/64]
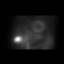
[frame 48/64]
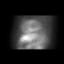
[frame 59/64]
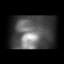

[36 of 36 positions shown; findings below may reference images not displayed]

Canned report from images found in remote index.

Refer to host system for actual result text.

## 2017-02-13 NOTE — Discharge Instructions (Signed)
Keep the dressing on until you can be seen by someone today at your orthopedic physician's office.

## 2017-02-13 NOTE — Progress Notes (Signed)
Spoke with patient and he saw Dr [name] today. Dr Aurea Graff recommendations were to hold Xarelto for 3 days and then start ASA 81 mg twice a day until follow up with him 02/23/17. Discussed with Dr [name] and she is agreeable with plan. Advised patient

## 2017-02-13 NOTE — ED Notes (Signed)
Bed: UL24 Expected date:  Expected time:  Means of arrival:  Comments: EMS 83 yo male on blood thinners SBP 113

## 2017-02-13 NOTE — Telephone Encounter (Signed)
-----   Message from Skeet Latch, MD sent at 02/13/2017  6:03 PM EST ----- Sorry.  OK to hold for 1 week. ----- Message ----- From: Delora Fuel, MD Sent: 02/13/2017   6:05 AM To: Skeet Latch, MD, [name], MD  Tiffany, Will;  Mr. Everard had a total knee arthroplasty Monday, and had some bleeding from his incision today. He resumed his Xarelto two days ago. He has not had any episodes of atrial fibrillation since his ablation a year ago. I am asking him to call your office for direction - would it be safe for him to stop his Xarelto for the next week?  Thank you for your help.  Delora Fuel

## 2017-02-13 NOTE — Telephone Encounter (Signed)
Spoke with patient and he saw Dr [name] today. Dr Aurea Graff recommendations were to hold Xarelto for 3 days and then start ASA 81 mg twice a day until follow up with him 02/23/17. Discussed with Dr [name] and she is agreeable with plan. Advised patient

## 2017-02-13 NOTE — ED Provider Notes (Signed)
[facility] DEPT Provider Note   CSN: [id number] Arrival date & time: 02/13/17  0447     History   Chief Complaint Chief Complaint  Patient presents with  . Laceration    HPI Theodore [name] is a 83 y.o. male.  The history is provided by the patient.  He has a history of paroxysmal atrial fibrillation, coronary artery disease, hypertension, hyperlipidemia and had a left total knee arthroplasty done 4 days ago.  He is chronically anticoagulated on rivaroxaban which she discontinued for 3 days prior to his surgery and which was resumed the day following his surgery.  This morning, he noted bleeding from his incision that has been leaking through the dressing.  Of note, he had a radiofrequency ablation for his atrial fibrillation in November 2017, and has not had any episodes of atrial fibrillation since then.  Past Medical History:  Diagnosis Date  . History of arthritis   . History of rheumatic fever   . Hypercholesterolemia   . Hypertension   . Nocturia   . PAF (paroxysmal atrial fibrillation) [facility]) January 2013   Placed on Pradaxa. Did not require cardioversion; spontaneously converted  . Peripheral edema   . Right bundle branch block   . SOB (shortness of breath)   . Tendonitis of elbow, left     Patient Active Problem List   Diagnosis Date Noted  . S/P left TKA, distal mass excision 02/09/2017  . Preoperative clearance 01/13/2017  . Obesity, Class I, BMI 30.0-34.9 (see actual BMI) 01/13/2017  . Peripheral edema   . History of rheumatic fever   . History of arthritis   . Tendonitis of elbow, left   . COPD exacerbation ([place]) 09/16/2016  . Health maintenance examination 02/06/2016  . Advanced care planning/counseling discussion 02/06/2016  . CAD (coronary artery disease), native coronary artery 09/28/2015  . Angina, class III ([place]) - with Dyspnea as major Sx. 09/28/2015  . Angina decubitus ([place])   . Abnormal nuclear stress test  09/06/2014  . Thrombocytopenia ([place]) 05/27/2013  . PVC's (premature ventricular contractions) 09/22/2012  . Insomnia 09/22/2012  . Osteoarthritis 06/18/2012  . Allergic-infective asthma 08/26/2011  . GERD (gastroesophageal reflux disease) 08/26/2011  . Paroxysmal atrial fibrillation ([place]) 01/21/2011  . Nocturia   . Right bundle branch block   . COPD mixed type ([place]) 12/28/2007  . HLD (hyperlipidemia) 11/27/2007  . Essential hypertension 11/27/2007  . Dyspnea on exertion 11/18/2007    Past Surgical History:  Procedure Laterality Date  . ATRIAL FIBRILLATION ABLATION  12/19/2015  . BACK SURGERY  12/24/09   fusion C3-C4  . BACK SURGERY  2010   fusion L4-L5  . CARDIAC CATHETERIZATION  2009   NONOBSTRUCTIVE ATHERSCLEROTIC CORONARY DISEASE AND NORMAL  LV FUNCTION  . CARDIAC CATHETERIZATION N/A 09/08/2014   Procedure: Left Heart Cath and Coronary Angiography;  Surgeon: Belva Crome, MD;  Location: [facility];  Service: Cardiovascular;  Laterality: N/A;  . CARDIAC CATHETERIZATION N/A 09/28/2015   Procedure: Left Heart Cath and Coronary Angiography;  Surgeon: Leonie Man, MD;  Location: [facility] LAB;  Service: Cardiovascular;  Laterality: N/A;  . CARDIAC CATHETERIZATION N/A 09/28/2015   Procedure: Intravascular Pressure Wire/FFR Study;  Surgeon: Leonie Man, MD;  Location: [facility] LAB;  Service: Cardiovascular;  Laterality: N/A;  . CARDIOVERSION N/A 10/23/2015   Procedure: CARDIOVERSION;  Surgeon: [name], MD;  Location: [facility] ENDOSCOPY;  Service: Cardiovascular;  Laterality: N/A;  . COLONOSCOPY  2013  per patient, rpt 5 yrs  . ELECTROPHYSIOLOGIC STUDY N/A 12/19/2015   Procedure: Atrial Fibrillation Ablation;  Surgeon: [name], MD;  Location: [facility] LAB;  Service: Cardiovascular;  Laterality: N/A;  . KNEE ARTHROSCOPY Left 03/2016   Dr. [name]  . PATELLAR TENDON REPAIR Left 2008  . REPLACEMENT TOTAL KNEE Right 2006  . TOTAL KNEE  ARTHROPLASTY Left 02/09/2017   Procedure: LEFT TOTAL KNEE ARTHROPLASTY, EXCISION LEFT DISTAL THIGH MASS;  Surgeon: [name], MD;  Location: [facility] ORS;  Service: Orthopedics;  Laterality: Left;  90 mins  . TRICEPS TENDON REPAIR Left 2013       Home Medications    Prior to Admission medications   Medication Sig Start Date End Date Taking? Authorizing Provider  acetaminophen (TYLENOL) 500 MG tablet Take 2 tablets (1,000 mg total) by mouth every 6 (six) hours. 02/11/17   Danae Orleans, PA-C  Ascorbic Acid (VITAMIN C PO) Take 1 tablet by mouth daily.     [provider]  atorvastatin (LIPITOR) 40 MG tablet TAKE 1 TABLET DAILY Patient taking differently: Take 40 mg by mouth once daily 06/30/16   Camnitz, Ocie Doyne, MD  Cholecalciferol (VITAMIN D3) 2000 units TABS Take 2,000 Units by mouth daily.    [provider]  diltiazem (CARTIA XT) 180 MG 24 hr capsule Take 1 capsule (180 mg total) by mouth daily. 11/14/16   Skeet Latch, MD  docusate sodium (COLACE) 100 MG capsule Take 1 capsule (100 mg total) by mouth 2 (two) times daily. 02/09/17   Danae Orleans, PA-C  ezetimibe (ZETIA) 10 MG tablet TAKE 1 TABLET DAILY Patient taking differently: Take 10 mg by mouth once daily 06/30/16   Camnitz, Ocie Doyne, MD  ferrous sulfate 325 (65 FE) MG tablet Take 1 tablet (325 mg total) by mouth 2 (two) times daily with a meal. 02/10/17   Babish, Rodman Key, PA-C  furosemide (LASIX) 20 MG tablet TAKE 1 TABLET DAILY Patient taking differently: Take 20 mg by mouth once daily 01/21/17   Skeet Latch, MD  losartan (COZAAR) 100 MG tablet TAKE 1 TABLET DAILY Patient taking differently: Take 100 mg by mouth once daily 12/04/16   Skeet Latch, MD  LYSINE PO Take 1 tablet by mouth daily.     [provider]  methocarbamol (ROBAXIN) 500 MG tablet Take 1 tablet (500 mg total) by mouth every 6 (six) hours as needed for muscle spasms. 02/11/17   Danae Orleans, PA-C  metoprolol  succinate (TOPROL-XL) 100 MG 24 hr tablet Take 1 tablet (100 mg total) by mouth 2 (two) times daily. Take with or immediately following a meal. 11/12/16   Skeet Latch, MD  omeprazole (PRILOSEC) 20 MG capsule Take 20 mg by mouth daily.      [provider]  ondansetron (ZOFRAN) 4 MG tablet Take 1 tablet (4 mg total) by mouth every 6 (six) hours as needed for nausea. 02/11/17   Danae Orleans, PA-C  polyethylene glycol (MIRALAX / GLYCOLAX) packet Take 17 g by mouth daily as needed for mild constipation. 02/09/17   Danae Orleans, PA-C  potassium chloride (K-DUR,KLOR-CON) 10 MEQ tablet TAKE 1 TABLET TWICE A DAY Patient taking differently: Take 10 meq by mouth twice daily 10/03/16   Skeet Latch, MD  Tiotropium Bromide Monohydrate (SPIRIVA RESPIMAT) 1.25 MCG/ACT AERS Inhale 2 puffs into the lungs daily.  01/13/17   [name], MD  traMADol (ULTRAM) 50 MG tablet Take 1-2 tablets (50-100 mg total) by mouth every 6 (six) hours as needed.  02/11/17   Danae Orleans, PA-C  XARELTO 20 MG TABS tablet TAKE 1 TABLET DAILY WITH SUPPER Patient taking differently: Take 20 mg by mouth daily with supper 08/27/16   Skeet Latch, MD    Family History Family History  Problem Relation Age of Onset  . Heart attack Father   . COPD Father   . Hypertension Father   . Heart disease Brother   . COPD Brother   . Asthma Sister   . Stroke Neg Hx     Social History Social History   Tobacco Use  . Smoking status: Never Smoker  . Smokeless tobacco: Never Used  Substance Use Topics  . Alcohol use: Yes    Alcohol/week: 1.2 oz    Types: 2 Cans of beer per week    Comment: 2 beers daily  . Drug use: No     Allergies   Penicillins; Amlodipine; Lisinopril; Zocor [simvastatin]; and Oseltamivir phosphate   Review of Systems Review of Systems  All other systems reviewed and are negative.    Physical Exam Updated Vital Signs BP 129/66   Pulse (!) 55   Temp 97.7 F (36.5 C) (Oral)    Resp 16   SpO2 94%   Physical Exam  Nursing note and vitals reviewed.  83 year old male, resting comfortably and in no acute distress. Vital signs are significant for bradycardia. Oxygen saturation is 94%, which is normal. Head is normocephalic and atraumatic. PERRLA, EOMI. Oropharynx is clear. Neck is nontender and supple without adenopathy or JVD. Back is nontender and there is no CVA tenderness. Lungs are clear without rales, wheezes, or rhonchi. Chest is nontender. Heart has regular rate and rhythm without murmur. Abdomen is soft, flat, nontender without masses or hepatosplenomegaly and peristalsis is normoactive. Extremities: Dressing is in place over anterior aspect of the left knee.  There is a pool of blood underneath the dressing with some areas were blood has trickled out.  There is no active bleeding.  There is 1-2+ pretibial edema bilaterally. Skin is warm and dry without rash. Neurologic: Mental status is normal, cranial nerves are intact, there are no motor or sensory deficits.  ED Treatments / Results   Procedures Procedures (including critical care time)  Medications Ordered in ED Medications - No data to display   Initial Impression / Assessment and Plan / ED Course  I have reviewed the triage vital signs and the nursing notes.  Postoperative bleeding in a patient who is anticoagulated on rivaroxaban.  Old records are reviewed confirming left total knee arthroplasty done December 31, and radiofrequency ablation for paroxysmal atrial fibrillation done December 19, 2015.  Amount of bleeding does not appear to be clinically significant.  However, I wonder if he could safely be taken off of his rivaroxaban for a brief period such as a week to allow the wound to get started healing and limit his risk of further bleeding.  He is not due for a dose of rivaroxaban until this evening.  I am requesting that he contact his cardiologist's office to see if he could safely hold his  rivaroxaban for the next week.  He is supposed to go to physical therapy at his orthopedic surgeon's office later this morning.  He will try to have 1 of the physicians assistants there come to evaluate his wound.  Final Clinical Impressions(s) / ED Diagnoses   Final diagnoses:  Postoperative hemorrhage of skin following non-dermatologic procedure  Chronic anticoagulation    ED Discharge Orders  None       Delora Fuel, MD 61/47/09 [phone]

## 2017-02-13 NOTE — ED Triage Notes (Signed)
Pt  Reports bleeding from total left knee surgrical site done 02/09/2017 Pt denies event or injury states takes xarelto for AFib that is controlled post ablation. VS BP: 135/70 HR: 67 Resp: Sat 98% RA, CBG: 139

## 2017-02-16 NOTE — Discharge Summary (Signed)
Physician Discharge Summary  Patient ID: Theodore [name] MRN: [id number] DOB/AGE: 09-16-1934 83 y.o.  Admit date: 02/09/2017 Discharge date: 02/11/2017   Procedures:  Procedure(s) (LRB): LEFT TOTAL KNEE ARTHROPLASTY, EXCISION LEFT DISTAL THIGH MASS (Left)  Attending Physician:  Dr. [name]   Admission Diagnoses:   Left knee primary OA / pain  and distal thigh mass/lipoma  Discharge Diagnoses:  Principal Problem:   S/P left TKA, distal mass excision  Past Medical History:  Diagnosis Date  . History of arthritis   . History of rheumatic fever   . Hypercholesterolemia   . Hypertension   . Nocturia   . PAF (paroxysmal atrial fibrillation) [facility]) January 2013   Placed on Pradaxa. Did not require cardioversion; spontaneously converted  . Peripheral edema   . Right bundle branch block   . SOB (shortness of breath)   . Tendonitis of elbow, left     HPI:    Theodore [name], 83 y.o. male, has a history of pain and functional disability in the left knee due to arthritis and has failed non-surgical conservative treatments for greater than 12 weeks to includeNSAID's and/or analgesics, corticosteriod injections and activity modification.  Onset of symptoms was gradual, starting 2+ years ago with gradually worsening course since that time. The patient noted prior procedures on the knee to include  arthroscopy on the left knee(s).  Patient currently rates pain in the left knee(s) at 7 out of 10 with activity. Patient has worsening of pain with activity and weight bearing, pain that interferes with activities of daily living, pain with passive range of motion, crepitus and joint swelling.  Patient has evidence of periarticular osteophytes and joint space narrowing by imaging studies. Patient also has present a distal thigh mass which has been present for year and gradually increasing in size.    There is no active infection.  Risks, benefits and expectations were discussed with the patient.   Risks including but not limited to the risk of anesthesia, blood clots, nerve damage, blood vessel damage, failure of the prosthesis, infection and up to and including death.  Patient understand the risks, benefits and expectations and wishes to proceed with surgery.  PCP: Theodore Bush, MD   Discharged Condition: good  Hospital Course:  Patient underwent the above stated procedure on 02/09/2017. Patient tolerated the procedure well and brought to the recovery room in good condition and subsequently to the floor.  POD #1 BP: 132/70 ; Pulse: 57 ; Temp: 98.2 F (36.8 C) ; Resp: 16 Patient reports pain as mild to moderate.  Not much sleep but ok.  Neurovascular intact and incision: dressing C/D/I.   LABS  Basename    HGB     12.1  HCT     35.6   POD #2  BP: 134/69 ; Pulse: 60 ; Temp: 98.6 F (37 C) ; Resp: 16 Patient reports pain as mild to moderate.  A lot of PONV yesterday maybe related to hydrocodone.  Up eating breakfast this am.  Feels better but didn't take narcotics last night.  Ready to be discharge home. Neurovascular intact and incision: dressing C/D/I.   LABS   No new labs  Discharge Exam: General appearance: alert, cooperative and no distress Extremities: Homans sign is negative, no sign of DVT, no edema, redness or tenderness in the calves or thighs and no ulcers, gangrene or trophic changes  Disposition: Home with follow up in 2 weeks   Follow-up Information    [name], MD.  Schedule an appointment as soon as possible for a visit in 2 week(s).   Specialty:  Orthopedic Surgery Contact information: [address] [place] [address] [phone]           Discharge Instructions    Call MD / Call 911   Complete by:  As directed    If you experience chest pain or shortness of breath, CALL 911 and be transported to the hospital emergency room.  If you develope a fever above 101 F, pus (white drainage) or increased drainage or redness  at the wound, or calf pain, call your surgeon's office.   Call MD / Call 911   Complete by:  As directed    If you experience chest pain or shortness of breath, CALL 911 and be transported to the hospital emergency room.  If you develope a fever above 101 F, pus (white drainage) or increased drainage or redness at the wound, or calf pain, call your surgeon's office.   Change dressing   Complete by:  As directed    Maintain surgical dressing until follow up in the clinic. If the edges start to pull up, may reinforce with tape. If the dressing is no longer working, may remove and cover with gauze and tape, but must keep the area dry and clean.  Call with any questions or concerns.   Constipation Prevention   Complete by:  As directed    Drink plenty of fluids.  Prune juice may be helpful.  You may use a stool softener, such as Colace (over the counter) 100 mg twice a day.  Use MiraLax (over the counter) for constipation as needed.   Constipation Prevention   Complete by:  As directed    Drink plenty of fluids.  Prune juice may be helpful.  You may use a stool softener, such as Colace (over the counter) 100 mg twice a day.  Use MiraLax (over the counter) for constipation as needed.   Diet - low sodium heart healthy   Complete by:  As directed    Diet - low sodium heart healthy   Complete by:  As directed    Discharge instructions   Complete by:  As directed    Maintain surgical dressing until follow up in the clinic. If the edges start to pull up, may reinforce with tape. If the dressing is no longer working, may remove and cover with gauze and tape, but must keep the area dry and clean.  Follow up in 2 weeks at [facility]. Call with any questions or concerns.   Increase activity slowly as tolerated   Complete by:  As directed    Weight bearing as tolerated with assist device (walker, cane, etc) as directed, use it as long as suggested by your surgeon or therapist, typically at least  4-6 weeks.   Increase activity slowly as tolerated   Complete by:  As directed    TED hose   Complete by:  As directed    Use stockings (TED hose) for 2 weeks on both leg(s).  You may remove them at night for sleeping.      Allergies as of 02/11/2017      Reactions   Penicillins Rash, Other (See Comments)   "Blistering rash" Has patient had a PCN reaction causing immediate rash, facial/tongue/throat swelling, SOB or lightheadedness with hypotension: Yes Has patient had a PCN reaction causing severe rash involving mucus membranes or skin necrosis: No Has patient had a PCN  reaction that required hospitalization: No Has patient had a PCN reaction occurring within the last 10 years: No If all of the above answers are "NO", then may proceed with Cephalosporin use.   Amlodipine Other (See Comments)   Peripheral edema   Lisinopril Cough   Zocor [simvastatin] Other (See Comments)   LFT increase with simvastatin and lovastatin   Oseltamivir Phosphate Itching, Rash      Medication List    STOP taking these medications   diclofenac sodium 1 % Gel Commonly known as:  VOLTAREN   naproxen sodium 220 MG tablet Commonly known as:  ALEVE     TAKE these medications   acetaminophen 500 MG tablet Commonly known as:  TYLENOL Take 2 tablets (1,000 mg total) by mouth every 6 (six) hours.   atorvastatin 40 MG tablet Commonly known as:  LIPITOR TAKE 1 TABLET DAILY What changed:    how much to take  how to take this  when to take this   diltiazem 180 MG 24 hr capsule Commonly known as:  CARTIA XT Take 1 capsule (180 mg total) by mouth daily.   docusate sodium 100 MG capsule Commonly known as:  COLACE Take 1 capsule (100 mg total) by mouth 2 (two) times daily.   ezetimibe 10 MG tablet Commonly known as:  ZETIA TAKE 1 TABLET DAILY What changed:    how much to take  how to take this  when to take this   ferrous sulfate 325 (65 FE) MG tablet Take 1 tablet (325 mg total) by  mouth 2 (two) times daily with a meal.   furosemide 20 MG tablet Commonly known as:  LASIX TAKE 1 TABLET DAILY What changed:    how much to take  how to take this  when to take this   losartan 100 MG tablet Commonly known as:  COZAAR TAKE 1 TABLET DAILY What changed:    how much to take  how to take this  when to take this   LYSINE PO Take 1 tablet by mouth daily.   methocarbamol 500 MG tablet Commonly known as:  ROBAXIN Take 1 tablet (500 mg total) by mouth every 6 (six) hours as needed for muscle spasms.   metoprolol succinate 100 MG 24 hr tablet Commonly known as:  TOPROL-XL Take 1 tablet (100 mg total) by mouth 2 (two) times daily. Take with or immediately following a meal.   omeprazole 20 MG capsule Commonly known as:  PRILOSEC Take 20 mg by mouth daily.   ondansetron 4 MG tablet Commonly known as:  ZOFRAN Take 1 tablet (4 mg total) by mouth every 6 (six) hours as needed for nausea.   polyethylene glycol packet Commonly known as:  MIRALAX / GLYCOLAX Take 17 g by mouth daily as needed for mild constipation.   potassium chloride 10 MEQ tablet Commonly known as:  K-DUR,KLOR-CON TAKE 1 TABLET TWICE A DAY What changed:    how much to take  how to take this  when to take this   SPIRIVA RESPIMAT 1.25 MCG/ACT Aers Generic drug:  Tiotropium Bromide Monohydrate Inhale 2 puffs into the lungs daily.   traMADol 50 MG tablet Commonly known as:  ULTRAM Take 1-2 tablets (50-100 mg total) by mouth every 6 (six) hours as needed.   VITAMIN C PO Take 1 tablet by mouth daily.   Vitamin D3 2000 units Tabs Take 2,000 Units by mouth daily.   XARELTO 20 MG Tabs tablet Generic drug:  rivaroxaban TAKE 1  TABLET DAILY WITH SUPPER What changed:  See the new instructions.            Discharge Care Instructions  (From admission, onward)        Start     Ordered   02/09/17 0000  Change dressing    Comments:  Maintain surgical dressing until follow up in  the clinic. If the edges start to pull up, may reinforce with tape. If the dressing is no longer working, may remove and cover with gauze and tape, but must keep the area dry and clean.  Call with any questions or concerns.   02/09/17 2151       Signed: [place]. Mileydi Milsap   PA-C  02/16/2017, 2:32 PM

## 2017-02-24 ENCOUNTER — Encounter: Payer: Self-pay | Admitting: Cardiovascular Disease

## 2017-03-02 ENCOUNTER — Ambulatory Visit (INDEPENDENT_AMBULATORY_CARE_PROVIDER_SITE_OTHER): Payer: Medicare Other | Admitting: Family Medicine

## 2017-03-02 ENCOUNTER — Encounter: Payer: Self-pay | Admitting: Family Medicine

## 2017-03-02 VITALS — BP 118/78 | HR 86 | Temp 97.9°F | Wt 216.2 lb

## 2017-03-02 DIAGNOSIS — I480 Paroxysmal atrial fibrillation: Secondary | ICD-10-CM

## 2017-03-02 DIAGNOSIS — R0609 Other forms of dyspnea: Secondary | ICD-10-CM

## 2017-03-02 DIAGNOSIS — I2510 Atherosclerotic heart disease of native coronary artery without angina pectoris: Secondary | ICD-10-CM

## 2017-03-02 DIAGNOSIS — I4510 Unspecified right bundle-branch block: Secondary | ICD-10-CM

## 2017-03-02 DIAGNOSIS — I493 Ventricular premature depolarization: Secondary | ICD-10-CM | POA: Diagnosis not present

## 2017-03-02 LAB — TSH: TSH: 1.43 u[IU]/mL (ref 0.35–4.50)

## 2017-03-02 LAB — BASIC METABOLIC PANEL
BUN: 21 mg/dL (ref 6–23)
CHLORIDE: 97 meq/L (ref 96–112)
CO2: 25 meq/L (ref 19–32)
Calcium: 9.5 mg/dL (ref 8.4–10.5)
Creatinine, Ser: 0.84 mg/dL (ref 0.40–1.50)
GFR: 93.34 mL/min (ref >60.00)
Glucose, Bld: 114 mg/dL — ABNORMAL HIGH (ref 70–99)
POTASSIUM: 4.3 meq/L (ref 3.5–5.1)
Sodium: 132 mEq/L — ABNORMAL LOW (ref 135–145)

## 2017-03-02 LAB — CBC WITH DIFFERENTIAL/PLATELET
BASOS PCT: 0.3 % (ref 0.0–3.0)
Basophils Absolute: 0 10*3/uL (ref 0.0–0.1)
EOS PCT: 0.8 % (ref 0.0–5.0)
Eosinophils Absolute: 0.1 10*3/uL (ref 0.0–0.7)
HCT: 38.6 % — ABNORMAL LOW (ref 39.0–52.0)
HEMOGLOBIN: 12.6 g/dL — AB (ref 13.0–17.0)
Lymphocytes Relative: 25.4 % (ref 12.0–46.0)
Lymphs Abs: 1.8 10*3/uL (ref 0.7–4.0)
MCHC: 32.6 g/dL (ref 30.0–36.0)
MCV: 100 fl (ref 78.0–100.0)
MONOS PCT: 13.1 % — AB (ref 3.0–12.0)
Monocytes Absolute: 0.9 10*3/uL (ref 0.1–1.0)
Neutro Abs: 4.4 10*3/uL (ref 1.4–7.7)
Neutrophils Relative %: 60.4 % (ref 43.0–77.0)
Platelets: 249 10*3/uL (ref 150.0–400.0)
RBC: 3.87 Mil/uL — AB (ref 4.22–5.81)
RDW: 19.2 % — ABNORMAL HIGH (ref 11.5–15.5)
WBC: 7.2 10*3/uL (ref 4.0–10.5)

## 2017-03-02 LAB — BRAIN NATRIURETIC PEPTIDE: Pro B Natriuretic peptide (BNP): 124 pg/mL — ABNORMAL HIGH (ref 0.0–100.0)

## 2017-03-02 MED ORDER — FERROUS SULFATE 325 (65 FE) MG PO TABS: 325.0000 mg | ORAL_TABLET | Freq: Every day | ORAL | 3 refills | Status: DC

## 2017-03-02 NOTE — Assessment & Plan Note (Addendum)
Stable after cardioversion/ablation 2017.  Currently on aspirin 81mg  BID in place of xarelto.

## 2017-03-02 NOTE — Patient Instructions (Addendum)
EKG today Labs today.  We will try to get you in sooner with cardiology for evaluation.  See Rosaria Ferries on your way out.

## 2017-03-02 NOTE — Progress Notes (Signed)
BP 118/78 (BP Location: Left Arm, Patient Position: Sitting, Cuff Size: Normal)   Pulse 86   Temp 97.9 F (36.6 C) (Oral)   Wt 216 lb 4 oz (98.1 kg)   SpO2 95%   BMI 30.16 kg/m    CC: fatigue Subjective:    Patient ID: Theodore [name], male    DOB: 1934/11/02, 83 y.o.   MRN: [id number]  HPI: Theodore [name] is a 83 y.o. male presenting on 03/02/2017 for Fatigue Theodore [name] to PT this morning. Was not able to complete due to extreme fatigue and SOB. Had same issue for about 2- 3 weeks since knee surgery. Was sent upstairs to see the doctor while at PT. Was given a note to show PCP.  Pt states has had thyroid issues in past. Concerned that may the issue)   Had L knee replacement 02/09/2017 Theodore [name]). 02/11/2017 developed wound dehiscence s/p ER eval. This was repaired. Upcoming ortho appt this Friday for stitches removal.   Trouble completing PT since then - marked fatigue, shortness of breath with any minimal exertion and general weakness. Some tachypalpitations. No chest pain with this. No cough or fevers or abd pain or blood in stool. No significant orthopnea  H/o cardioversion and successful ablation for afib 12/2015. Currently taking aspirin 81mg  bid in place of xarelto after recent knee surgery.  He is also taking diltiazem XT 180mg  daily, lasix 20mg  daily, losartan 100mg  daily, and toprol XL 100mg  BID.  H/o borderline thyroid problems in the past.  He is taking iron tablets once daily.   Has appt with cards next Tuesday.   Relevant past medical, surgical, family and social history reviewed and updated as indicated. Interim medical history since our last visit reviewed. Allergies and medications reviewed and updated. Outpatient Medications Prior to Visit  Medication Sig Dispense Refill  . acetaminophen (TYLENOL) 500 MG tablet Take 2 tablets (1,000 mg total) by mouth every 6 (six) hours. 30 tablet 0  . Ascorbic Acid (VITAMIN C PO) Take 1 tablet by mouth daily.     [name] aspirin 81 MG chewable  tablet Chew 81 mg by mouth 2 (two) times daily.    [name] atorvastatin (LIPITOR) 40 MG tablet TAKE 1 TABLET DAILY (Patient taking differently: Take 40 mg by mouth once daily) 90 tablet 3  . Cholecalciferol (VITAMIN D3) 2000 units TABS Take 2,000 Units by mouth daily.    [name] diltiazem (CARTIA XT) 180 MG 24 hr capsule Take 1 capsule (180 mg total) by mouth daily. 90 capsule 3  . ezetimibe (ZETIA) 10 MG tablet TAKE 1 TABLET DAILY (Patient taking differently: Take 10 mg by mouth once daily) 90 tablet 3  . furosemide (LASIX) 20 MG tablet TAKE 1 TABLET DAILY (Patient taking differently: Take 20 mg by mouth once daily) 90 tablet 0  . losartan (COZAAR) 100 MG tablet TAKE 1 TABLET DAILY (Patient taking differently: Take 100 mg by mouth once daily) 90 tablet 3  . LYSINE PO Take 1 tablet by mouth daily.     . methocarbamol (ROBAXIN) 500 MG tablet Take 1 tablet (500 mg total) by mouth every 6 (six) hours as needed for muscle spasms. 30 tablet 0  . metoprolol succinate (TOPROL-XL) 100 MG 24 hr tablet Take 1 tablet (100 mg total) by mouth 2 (two) times daily. Take with or immediately following a meal. 180 tablet 1  . omeprazole (PRILOSEC) 20 MG capsule Take 20 mg by mouth daily.      . potassium chloride (K-DUR,KLOR-CON) 10  MEQ tablet TAKE 1 TABLET TWICE A DAY (Patient taking differently: Take 10 meq by mouth twice daily) 180 tablet 3  . Tiotropium Bromide Monohydrate (SPIRIVA RESPIMAT) 1.25 MCG/ACT AERS Inhale 2 puffs into the lungs daily.     . ferrous sulfate 325 (65 FE) MG tablet Take 1 tablet (325 mg total) by mouth 2 (two) times daily with a meal.  3  . XARELTO 20 MG TABS tablet TAKE 1 TABLET DAILY WITH SUPPER (Patient not taking: Reported on 03/02/2017) 90 tablet 1  . docusate sodium (COLACE) 100 MG capsule Take 1 capsule (100 mg total) by mouth 2 (two) times daily. 10 capsule 0  . ondansetron (ZOFRAN) 4 MG tablet Take 1 tablet (4 mg total) by mouth every 6 (six) hours as needed for nausea. 30 tablet 0  .  polyethylene glycol (MIRALAX / GLYCOLAX) packet Take 17 g by mouth daily as needed for mild constipation. 14 each 0  . traMADol (ULTRAM) 50 MG tablet Take 1-2 tablets (50-100 mg total) by mouth every 6 (six) hours as needed. 40 tablet 0   No facility-administered medications prior to visit.      Per HPI unless specifically indicated in ROS section below Review of Systems     Objective:    BP 118/78 (BP Location: Left Arm, Patient Position: Sitting, Cuff Size: Normal)   Pulse 86   Temp 97.9 F (36.6 C) (Oral)   Wt 216 lb 4 oz (98.1 kg)   SpO2 95%   BMI 30.16 kg/m   Wt Readings from Last 3 Encounters:  03/02/17 216 lb 4 oz (98.1 kg)  02/09/17 222 lb (100.7 kg)  01/28/17 222 lb (100.7 kg)    Physical Exam  Constitutional: He appears well-developed and well-nourished. No distress.  HENT:  Mouth/Throat: Oropharynx is clear and moist. No oropharyngeal exudate.  Cardiovascular: Normal rate and intact distal pulses. An irregularly irregular rhythm present.  No murmur heard. Pulmonary/Chest: Effort normal and breath sounds normal. No respiratory distress. He has no wheezes. He has no rales.  Musculoskeletal: He exhibits edema (post-op LLE edema).  No palpable cords  Skin: Skin is warm and dry.  Bruising of LLE Dressing L knee c/d/i  Nursing note and vitals reviewed.  Results for orders placed or performed during the hospital encounter of 02/09/17  CBC  Result Value Ref Range   WBC 15.0 (H) 4.0 - 10.5 K/uL   RBC 3.86 (L) 4.22 - 5.81 MIL/uL   Hemoglobin 12.1 (L) 13.0 - 17.0 g/dL   HCT 35.6 (L) 39.0 - 52.0 %   MCV 92.2 78.0 - 100.0 fL   MCH 31.3 26.0 - 34.0 pg   MCHC 34.0 30.0 - 36.0 g/dL   RDW 13.6 11.5 - 15.5 %   Platelets 146 (L) 150 - 400 K/uL  Basic metabolic panel  Result Value Ref Range   Sodium 132 (L) 135 - 145 mmol/L   Potassium 4.3 3.5 - 5.1 mmol/L   Chloride 100 (L) 101 - 111 mmol/L   CO2 25 22 - 32 mmol/L   Glucose, Bld 143 (H) 65 - 99 mg/dL   BUN 16 6 - 20  mg/dL   Creatinine, Ser 1.00 0.61 - 1.24 mg/dL   Calcium 8.8 (L) 8.9 - 10.3 mg/dL   GFR calc non Af Amer >60 >60 mL/min   GFR calc Af Amer >60 >60 mL/min   Anion gap 7 5 - 15  Type and screen [place]  Result Value Ref Range  ABO/RH(D) A POS    Antibody Screen NEG    Sample Expiration 02/12/2017   ABO/Rh  Result Value Ref Range   ABO/RH(D) A POS    Lab Results  Component Value Date   TSH 1.64 08/07/2015    EKG - NSR rate 90s, LAD with LAFB, PVCs, diffuse ST changes overall unchanged from prior      Assessment & Plan:   Problem List Items Addressed This Visit    CAD (coronary artery disease), native coronary artery (Chronic)   Relevant Medications   aspirin 81 MG chewable tablet   Exertional dyspnea - Primary    New over the last few weeks, since knee surgery.  EKG without recurrent afib. Vitals stable. Exam overall benign.  Check labs today. Not quite consistent with DVT - check D dimer and likely CTA to r/o PE if abnormal.  Will try to expedite cards eval this week as well.       Relevant Orders   Basic metabolic panel   TSH   CBC with Differential/Platelet   Brain natriuretic peptide   EKG 12-Lead (Completed)   Ambulatory referral to Cardiology   D-dimer, quantitative (not at [facility])   Paroxysmal atrial fibrillation ([place]) (Chronic)    Stable after cardioversion/ablation 2017.  Currently on aspirin 81mg  BID in place of xarelto.       Relevant Medications   aspirin 81 MG chewable tablet   Other Relevant Orders   Ambulatory referral to Cardiology   PVC's (premature ventricular contractions)   Relevant Medications   aspirin 81 MG chewable tablet   Right bundle branch block (Chronic)   Relevant Medications   aspirin 81 MG chewable tablet       Follow up plan: No Follow-up on file.  [name], MD

## 2017-03-02 NOTE — Assessment & Plan Note (Signed)
New over the last few weeks, since knee surgery.  EKG without recurrent afib. Vitals stable. Exam overall benign.  Check labs today. Not quite consistent with DVT - check D dimer and likely CTA to r/o PE if abnormal.  Will try to expedite cards eval this week as well.

## 2017-03-03 ENCOUNTER — Ambulatory Visit (INDEPENDENT_AMBULATORY_CARE_PROVIDER_SITE_OTHER): Payer: Medicare Other | Admitting: Cardiology

## 2017-03-03 ENCOUNTER — Encounter (HOSPITAL_COMMUNITY): Payer: Self-pay

## 2017-03-03 ENCOUNTER — Ambulatory Visit (INDEPENDENT_AMBULATORY_CARE_PROVIDER_SITE_OTHER)
Admission: RE | Admit: 2017-03-03 | Discharge: 2017-03-03 | Disposition: A | Discharge status: Home / Self Care | Payer: Medicare Other | Source: Ambulatory Visit | Attending: Cardiology | Admitting: Cardiology

## 2017-03-03 ENCOUNTER — Encounter: Payer: Self-pay | Admitting: Cardiology

## 2017-03-03 ENCOUNTER — Telehealth: Payer: Self-pay | Admitting: *Deleted

## 2017-03-03 ENCOUNTER — Other Ambulatory Visit: Payer: Self-pay

## 2017-03-03 ENCOUNTER — Observation Stay (HOSPITAL_COMMUNITY)
Admission: EM | Admit: 2017-03-03 | Discharge: 2017-03-04 | Disposition: A | Discharge status: Home / Self Care | Payer: Medicare Other | Attending: Internal Medicine | Admitting: Internal Medicine

## 2017-03-03 VITALS — BP 118/68 | HR 77 | Ht 71.0 in | Wt 217.0 lb

## 2017-03-03 DIAGNOSIS — E785 Hyperlipidemia, unspecified: Secondary | ICD-10-CM | POA: Insufficient documentation

## 2017-03-03 DIAGNOSIS — R0609 Other forms of dyspnea: Secondary | ICD-10-CM

## 2017-03-03 DIAGNOSIS — R0600 Dyspnea, unspecified: Secondary | ICD-10-CM

## 2017-03-03 DIAGNOSIS — E669 Obesity, unspecified: Secondary | ICD-10-CM | POA: Diagnosis not present

## 2017-03-03 DIAGNOSIS — Z880 Allergy status to penicillin: Secondary | ICD-10-CM | POA: Insufficient documentation

## 2017-03-03 DIAGNOSIS — I428 Other cardiomyopathies: Secondary | ICD-10-CM | POA: Diagnosis not present

## 2017-03-03 DIAGNOSIS — I480 Paroxysmal atrial fibrillation: Secondary | ICD-10-CM

## 2017-03-03 DIAGNOSIS — I2690 Septic pulmonary embolism without acute cor pulmonale: Secondary | ICD-10-CM | POA: Diagnosis not present

## 2017-03-03 DIAGNOSIS — K219 Gastro-esophageal reflux disease without esophagitis: Secondary | ICD-10-CM | POA: Diagnosis not present

## 2017-03-03 DIAGNOSIS — J449 Chronic obstructive pulmonary disease, unspecified: Secondary | ICD-10-CM | POA: Diagnosis not present

## 2017-03-03 DIAGNOSIS — Z79899 Other long term (current) drug therapy: Secondary | ICD-10-CM | POA: Diagnosis not present

## 2017-03-03 DIAGNOSIS — D62 Acute posthemorrhagic anemia: Secondary | ICD-10-CM | POA: Diagnosis not present

## 2017-03-03 DIAGNOSIS — I2510 Atherosclerotic heart disease of native coronary artery without angina pectoris: Secondary | ICD-10-CM | POA: Diagnosis not present

## 2017-03-03 DIAGNOSIS — E7800 Pure hypercholesterolemia, unspecified: Secondary | ICD-10-CM | POA: Insufficient documentation

## 2017-03-03 DIAGNOSIS — I4891 Unspecified atrial fibrillation: Secondary | ICD-10-CM | POA: Diagnosis present

## 2017-03-03 DIAGNOSIS — I4510 Unspecified right bundle-branch block: Secondary | ICD-10-CM | POA: Diagnosis not present

## 2017-03-03 DIAGNOSIS — Z7901 Long term (current) use of anticoagulants: Secondary | ICD-10-CM

## 2017-03-03 DIAGNOSIS — R0602 Shortness of breath: Secondary | ICD-10-CM

## 2017-03-03 DIAGNOSIS — Z7982 Long term (current) use of aspirin: Secondary | ICD-10-CM | POA: Insufficient documentation

## 2017-03-03 DIAGNOSIS — Z6829 Body mass index (BMI) 29.0-29.9, adult: Secondary | ICD-10-CM | POA: Diagnosis not present

## 2017-03-03 DIAGNOSIS — I10 Essential (primary) hypertension: Secondary | ICD-10-CM | POA: Diagnosis not present

## 2017-03-03 DIAGNOSIS — Z96652 Presence of left artificial knee joint: Secondary | ICD-10-CM

## 2017-03-03 DIAGNOSIS — Z86711 Personal history of pulmonary embolism: Secondary | ICD-10-CM | POA: Diagnosis present

## 2017-03-03 DIAGNOSIS — I4819 Other persistent atrial fibrillation: Secondary | ICD-10-CM | POA: Diagnosis present

## 2017-03-03 DIAGNOSIS — I2699 Other pulmonary embolism without acute cor pulmonale: Secondary | ICD-10-CM | POA: Diagnosis not present

## 2017-03-03 DIAGNOSIS — Z9861 Coronary angioplasty status: Secondary | ICD-10-CM | POA: Diagnosis present

## 2017-03-03 LAB — CBC WITH DIFFERENTIAL/PLATELET
Basophils Absolute: 0 10*3/uL (ref 0.0–0.1)
Basophils Relative: 0 %
Eosinophils Absolute: 0.1 10*3/uL (ref 0.0–0.7)
Eosinophils Relative: 2 %
HCT: 37.5 % — ABNORMAL LOW (ref 39.0–52.0)
Hemoglobin: 11.8 g/dL — ABNORMAL LOW (ref 13.0–17.0)
Lymphocytes Relative: 25 %
Lymphs Abs: 1.7 10*3/uL (ref 0.7–4.0)
MCH: 32.2 pg (ref 26.0–34.0)
MCHC: 31.5 g/dL (ref 30.0–36.0)
MCV: 102.2 fL — ABNORMAL HIGH (ref 78.0–100.0)
Monocytes Absolute: 0.7 10*3/uL (ref 0.1–1.0)
Monocytes Relative: 10 %
Neutro Abs: 4.3 10*3/uL (ref 1.7–7.7)
Neutrophils Relative %: 63 %
Platelets: 211 10*3/uL (ref 150–400)
RBC: 3.67 MIL/uL — ABNORMAL LOW (ref 4.22–5.81)
RDW: 17.4 % — ABNORMAL HIGH (ref 11.5–15.5)
WBC: 6.8 10*3/uL (ref 4.0–10.5)

## 2017-03-03 LAB — I-STAT TROPONIN, ED: Troponin i, poc: 0.02 ng/mL (ref 0.00–0.08)

## 2017-03-03 LAB — PROTIME-INR
INR: 1.11
Prothrombin Time: 14.3 seconds (ref 11.4–15.2)

## 2017-03-03 LAB — BASIC METABOLIC PANEL
Anion gap: 12 (ref 5–15)
BUN: 17 mg/dL (ref 6–20)
CO2: 21 mmol/L — ABNORMAL LOW (ref 22–32)
Calcium: 9.1 mg/dL (ref 8.9–10.3)
Chloride: 99 mmol/L — ABNORMAL LOW (ref 101–111)
Creatinine, Ser: 0.94 mg/dL (ref 0.61–1.24)
GFR calc Af Amer: >60 mL/min (ref >60)
GFR calc non Af Amer: >60 mL/min (ref >60)
Glucose, Bld: 141 mg/dL — ABNORMAL HIGH (ref 65–99)
Potassium: 3.9 mmol/L (ref 3.5–5.1)
Sodium: 132 mmol/L — ABNORMAL LOW (ref 135–145)

## 2017-03-03 LAB — APTT: aPTT: 31 seconds (ref 24–36)

## 2017-03-03 LAB — D-DIMER, QUANTITATIVE

## 2017-03-03 MED ORDER — IOPAMIDOL (ISOVUE-370) INJECTION 76%
80.0000 mL | Freq: Once | INTRAVENOUS | Status: AC | PRN
  Administered 2017-03-03: 80 mL via INTRAVENOUS

## 2017-03-03 MED ORDER — SODIUM CHLORIDE 0.9 % IV SOLN: 250.0000 mL | INTRAVENOUS | Status: DC | PRN

## 2017-03-03 MED ORDER — TIOTROPIUM BROMIDE MONOHYDRATE 18 MCG IN CAPS
1.0000 | ORAL_CAPSULE | Freq: Every day | RESPIRATORY_TRACT | Status: DC
  Filled 2017-03-03: qty 5

## 2017-03-03 MED ORDER — EZETIMIBE 10 MG PO TABS
10.0000 mg | ORAL_TABLET | Freq: Every day | ORAL | Status: DC
  Administered 2017-03-04: 10 mg via ORAL
  Filled 2017-03-03: qty 1

## 2017-03-03 MED ORDER — METHOCARBAMOL 500 MG PO TABS
500.0000 mg | ORAL_TABLET | Freq: Four times a day (QID) | ORAL | Status: DC
  Administered 2017-03-03 – 2017-03-04 (×4): 500 mg via ORAL
  Filled 2017-03-03 (×4): qty 1

## 2017-03-03 MED ORDER — FUROSEMIDE 20 MG PO TABS
20.0000 mg | ORAL_TABLET | Freq: Every day | ORAL | Status: DC
  Administered 2017-03-04: 20 mg via ORAL
  Filled 2017-03-03: qty 1

## 2017-03-03 MED ORDER — ASPIRIN 81 MG PO CHEW: 81.0000 mg | CHEWABLE_TABLET | Freq: Two times a day (BID) | ORAL | Status: DC

## 2017-03-03 MED ORDER — HEPARIN BOLUS VIA INFUSION
5000.0000 [IU] | Freq: Once | INTRAVENOUS | Status: AC
  Administered 2017-03-03: 5000 [IU] via INTRAVENOUS
  Filled 2017-03-03: qty 5000

## 2017-03-03 MED ORDER — HEPARIN (PORCINE) IN NACL 100-0.45 UNIT/ML-% IJ SOLN
1200.0000 [IU]/h | INTRAMUSCULAR | Status: DC
  Administered 2017-03-03 – 2017-03-04 (×2): 1650 [IU]/h via INTRAVENOUS
  Filled 2017-03-03 (×2): qty 250

## 2017-03-03 MED ORDER — SODIUM CHLORIDE 0.9% FLUSH: 3.0000 mL | INTRAVENOUS | Status: DC | PRN

## 2017-03-03 MED ORDER — LYSINE 500 MG PO TABS: ORAL_TABLET | Freq: Every day | ORAL | Status: DC

## 2017-03-03 MED ORDER — LOSARTAN POTASSIUM 50 MG PO TABS
100.0000 mg | ORAL_TABLET | Freq: Every day | ORAL | Status: DC
  Administered 2017-03-04: 100 mg via ORAL
  Filled 2017-03-03: qty 2

## 2017-03-03 MED ORDER — ATORVASTATIN CALCIUM 40 MG PO TABS
40.0000 mg | ORAL_TABLET | Freq: Every day | ORAL | Status: DC
  Administered 2017-03-04: 40 mg via ORAL
  Filled 2017-03-03: qty 1

## 2017-03-03 MED ORDER — SODIUM CHLORIDE 0.9% FLUSH: 3.0000 mL | Freq: Two times a day (BID) | INTRAVENOUS | Status: DC

## 2017-03-03 MED ORDER — OXYCODONE HCL 5 MG PO TABS: 5.0000 mg | ORAL_TABLET | ORAL | Status: DC | PRN

## 2017-03-03 MED ORDER — VITAMIN D 1000 UNITS PO TABS
2000.0000 [IU] | ORAL_TABLET | Freq: Every day | ORAL | Status: DC
  Administered 2017-03-04: 2000 [IU] via ORAL
  Filled 2017-03-03 (×2): qty 2

## 2017-03-03 MED ORDER — METOPROLOL SUCCINATE ER 100 MG PO TB24
100.0000 mg | ORAL_TABLET | Freq: Two times a day (BID) | ORAL | Status: DC
  Administered 2017-03-03 – 2017-03-04 (×2): 100 mg via ORAL
  Filled 2017-03-03 (×2): qty 1

## 2017-03-03 MED ORDER — DOCUSATE SODIUM 100 MG PO CAPS
100.0000 mg | ORAL_CAPSULE | Freq: Two times a day (BID) | ORAL | Status: DC
  Administered 2017-03-04: 100 mg via ORAL
  Filled 2017-03-03: qty 1

## 2017-03-03 MED ORDER — VITAMIN C 500 MG PO TABS
500.0000 mg | ORAL_TABLET | Freq: Every day | ORAL | Status: DC
Start: 2017-03-04 — End: 2017-03-04
  Administered 2017-03-04: 500 mg via ORAL
  Filled 2017-03-03: qty 1

## 2017-03-03 MED ORDER — ONDANSETRON HCL 4 MG/2ML IJ SOLN: 4.0000 mg | Freq: Four times a day (QID) | INTRAMUSCULAR | Status: DC | PRN

## 2017-03-03 MED ORDER — FERROUS SULFATE 325 (65 FE) MG PO TABS
325.0000 mg | ORAL_TABLET | Freq: Every day | ORAL | Status: DC
  Administered 2017-03-04: 325 mg via ORAL
  Filled 2017-03-03: qty 1

## 2017-03-03 MED ORDER — DILTIAZEM HCL ER COATED BEADS 180 MG PO CP24
180.0000 mg | ORAL_CAPSULE | Freq: Every day | ORAL | Status: DC
  Administered 2017-03-04: 180 mg via ORAL
  Filled 2017-03-03: qty 1

## 2017-03-03 MED ORDER — POTASSIUM CHLORIDE CRYS ER 10 MEQ PO TBCR
10.0000 meq | EXTENDED_RELEASE_TABLET | Freq: Two times a day (BID) | ORAL | Status: DC
  Administered 2017-03-03 – 2017-03-04 (×2): 10 meq via ORAL
  Filled 2017-03-03 (×2): qty 1

## 2017-03-03 MED ORDER — PANTOPRAZOLE SODIUM 40 MG PO TBEC
40.0000 mg | DELAYED_RELEASE_TABLET | Freq: Every day | ORAL | Status: DC
  Administered 2017-03-04: 40 mg via ORAL
  Filled 2017-03-03: qty 1

## 2017-03-03 MED ORDER — ONDANSETRON HCL 4 MG PO TABS: 4.0000 mg | ORAL_TABLET | Freq: Four times a day (QID) | ORAL | Status: DC | PRN

## 2017-03-03 MED ORDER — ACETAMINOPHEN 500 MG PO TABS
500.0000 mg | ORAL_TABLET | Freq: Four times a day (QID) | ORAL | Status: DC
  Administered 2017-03-03 – 2017-03-04 (×4): 500 mg via ORAL
  Filled 2017-03-03 (×4): qty 1

## 2017-03-03 NOTE — ED Notes (Signed)
Attempted to call report; nurse in another patient's room. Will call me back

## 2017-03-03 NOTE — Telephone Encounter (Signed)
Copied from [place]. Topic: General - Other >> Mar 03, 2017 12:02 PM Valla Leaver wrote: Reason for CRM: [name] w/ [facility] needs a call back from [facility] or Dr. Darnell Level about the d-dimer test Dr. Darnell Level ordered for the patient yesterday. It's urgent they get the results. If not they need to order a chest CT asap. When the callback is made please let whomever answers know that [name] is expecting their call.

## 2017-03-03 NOTE — ED Provider Notes (Signed)
Patient placed in Quick Look pathway, seen and evaluated   Chief Complaint: Shortness of breath   HPI:   83 year old male presents today with bilateral pulmonary embolism.  [facility] showed bilateral PEs.  Patient status post left total knee on 02/09/2017.  He notes prior to that he was on Xarelto for atrial fibrillation.  He has stopped taking this medication as he had bleed from the surgical incision.   CT angios from today shows bilateral lobar pulmonary emboli positive for acute PE with CT evidence of right heart strain RV LV ratio 1.32  ROS:  SOB (one)  Physical Exam:   Gen: No distress  Neuro: Awake and Alert  Skin: Warm    Focused Exam:   Bruising noted to the left knee-bandaging in place   Initiation of care has begun. The patient has been counseled on the process, plan, and necessity for staying for the completion/evaluation, and the remainder of the medical screening examination    Okey Regal, Hershal Coria 03/03/17 1646

## 2017-03-03 NOTE — Assessment & Plan Note (Signed)
CHADS VASC=5 

## 2017-03-03 NOTE — Telephone Encounter (Signed)
I just see that D dimer was cancelled I don't know why and I was not notified - having my lab tech call the lab to find out. If pt still at heart care, please have them draw blood. If not, he will need to come in for repeat blood test stat.

## 2017-03-03 NOTE — Telephone Encounter (Signed)
Thank you. I see pt has been sent for CTA today.  Will await result.

## 2017-03-03 NOTE — Telephone Encounter (Signed)
Received notification patient positive for bilateral PE see below  IMPRESSION: Bilateral lobar pulmonary emboli. Positive for acute PE with CTevidence of right heart strain (RV/LV Ratio = 1.32) consistent with at least submassive (intermediate risk) PE. The presence of right heart strain has been associated with an increased risk of morbidity and mortality  Spoke with patient and he is going straight to [facility] from [address] location. Called and spoke with Janett Billow in triage at ED and advised patient needs to be seen ASAP per [facility]. Did notify Trish with [facility] in case cardiology called.

## 2017-03-03 NOTE — Progress Notes (Signed)
ANTICOAGULATION CONSULT NOTE - Initial Consult  Pharmacy Consult for Heparin Indication: pulmonary embolus  Allergies  Allergen Reactions  . Penicillins Rash and Other (See Comments)    "Blistering rash" Has patient had a PCN reaction causing immediate rash, facial/tongue/throat swelling, SOB or lightheadedness with hypotension: Yes Has patient had a PCN reaction causing severe rash involving mucus membranes or skin necrosis: No Has patient had a PCN reaction that required hospitalization: No Has patient had a PCN reaction occurring within the last 10 years: No If all of the above answers are "NO", then may proceed with Cephalosporin use.   . Amlodipine Other (See Comments)    Peripheral edema  . Lisinopril Cough  . Zocor [Simvastatin] Other (See Comments)    LFT increase with simvastatin and lovastatin   . Oseltamivir Phosphate Itching and Rash    Patient Measurements: Heparin Dosing Weight: 95.4kg  Vital Signs: Temp: 97.7 F (36.5 C) (01/22 1639) Temp Source: Oral (01/22 1639) BP: 152/78 (01/22 1700) Pulse Rate: 76 (01/22 1700)  Labs: Recent Labs    03/02/17 1355  HGB 12.6*  HCT 38.6*  PLT 249.0  CREATININE 0.84    Estimated Creatinine Clearance: 83.8 mL/min (by C-G formula based on SCr of 0.84 mg/dL).   Medical History: Past Medical History:  Diagnosis Date  . History of arthritis   . History of rheumatic fever   . Hypercholesterolemia   . Hypertension   . Nocturia   . PAF (paroxysmal atrial fibrillation) [facility]) January 2013   Placed on Pradaxa. Did not require cardioversion; spontaneously converted  . Peripheral edema   . Right bundle branch block   . SOB (shortness of breath)   . Tendonitis of elbow, left     Medications:  Xarelto PTA dose 20mg  daily (pt report stopped taking on 02/12/17 due to incision bleeding from knee replacement on 02/09/17)  Assessment: 82yom presented to ED with bilateral PE. Pt has hx of Afib on Xarelto PTA (see above). Low  Hgb at 12.6. Scr and Plt WNL.  Goal of Therapy:  Heparin level 0.3-0.7 units/ml Monitor platelets by anticoagulation protocol: Yes   Plan:  Give 5000 units bolus x 1 Start heparin infusion at 1650 units/hr Continue to monitor H&H and platelets  8 hour heparin level Watch incision bleeding closely  Azure Barrales 03/03/2017,5:07 PM

## 2017-03-03 NOTE — Addendum Note (Signed)
Addended by: Alvina Filbert B on: 03/03/2017 01:37 PM   Modules accepted: Orders

## 2017-03-03 NOTE — Assessment & Plan Note (Signed)
Known 70% LAD with negative Myoview and negative FFR

## 2017-03-03 NOTE — Telephone Encounter (Signed)
Spoke with [name] asking if pt was still in office.  Says he has been sent for chest CT to r/o PE.

## 2017-03-03 NOTE — Assessment & Plan Note (Signed)
Last EF 35-40% at cath 2017

## 2017-03-03 NOTE — Assessment & Plan Note (Signed)
NSR today but will check a 48 hour Holter to r/o PAF with exertion

## 2017-03-03 NOTE — Patient Instructions (Signed)
Medication Instructions:  Your physician recommends that you continue on your current medications as directed. Please refer to the Current Medication list given to you today.  Labwork: NONE  Testing/Procedures: Your physician has recommended that you wear a holter monitor. Holter monitors are medical devices that record the heart's electrical activity. Doctors most often use these monitors to diagnose arrhythmias. Arrhythmias are problems with the speed or rhythm of the heartbeat. The monitor is a small, portable device. You can wear one while you do your normal daily activities. This is usually used to diagnose what is causing palpitations/syncope (passing out). [place] ASAP  [address] OFFICE  Follow-Up: KEEP VISIT WITH DR [place] NEXT WEEK   If you need a refill on your cardiac medications before your next appointment, please call your pharmacy.

## 2017-03-03 NOTE — Assessment & Plan Note (Signed)
DOE and exertional tachycardia while off Xarelto x two weeks- R/O PE He does not appear to be in CHF on exam, rhythm stable

## 2017-03-03 NOTE — Progress Notes (Addendum)
03/03/2017 Theodore [name]   March 28, 1934  [id number]  Primary Physician [name], MD Primary Cardiologist: Dr [name]  HPI:  83 y.o. male followed by Dr [name] with chronic systolic and diastolic heart failure, hypertension, hyperlipidemia, paroxysmal atrial fibrillation, asthma, and a history Rheumatic fever who presents as an add on today with complaints of SOB and exertional tachycardia.    Theodore [name] was previously a patient of Dr. Mare Ferrari.  He was diagnosed with atrial fibrillation in 2014.  He was initially on Pradaxa and switched to Xarelto.  He had an episode of chest pain 08/2014. LHC that revealed 70% ostial LAD, 50% prox LAD, 30% OM1 and 35% RCA.  He was managed medically.  Echo 09/2014 that showed LVEF 55-60% with mild MR and AR and grade 1 diastolic dysfunction.     He was seen again 09/25/15 and reported significant shortness of breath with minimal exertion.  He had a LHC with Dr. Ellyn Hack on 09/28/15 that again revealed a 70% ostial-proximal LAD lesion.  Dr. Ellyn Hack performed FFR on that lesion and there was no significant change.  The left ventriculogram performed at that time revealed LVEF 35-45%.  Theodore [name] continued to have exertional dyspnea so he was referred for [facility] 10/17/15 that was negative for ischemia. He subsequently underwent DCCV on 10/23/15 and ultimatley underwent ablation on 12/19/15 with Dr. [name]. He has remained in NSR.  On 02/09/17 he had Lt TKR. A few days later he had bleeding and his Xarelto was stopped. He remains off Xarelto. Since his knee surgery he has has noted DOE and exertional tachycardia. He went ot his PCP yesterday who ordered labs including a D-dimer. The D-dimer is still pending. He denies hemoptysis or chest pain.      Current Outpatient Medications  Medication Sig Dispense Refill  . acetaminophen (TYLENOL) 500 MG tablet Take 2 tablets (1,000 mg total) by mouth every 6 (six) hours. 30 tablet 0  . Ascorbic Acid (VITAMIN C  PO) Take 1 tablet by mouth daily.     [name] aspirin 81 MG chewable tablet Chew 81 mg by mouth 2 (two) times daily.    [name] atorvastatin (LIPITOR) 40 MG tablet TAKE 1 TABLET DAILY (Patient taking differently: Take 40 mg by mouth once daily) 90 tablet 3  . Cholecalciferol (VITAMIN D3) 2000 units TABS Take 2,000 Units by mouth daily.    [name] diltiazem (CARTIA XT) 180 MG 24 hr capsule Take 1 capsule (180 mg total) by mouth daily. 90 capsule 3  . ezetimibe (ZETIA) 10 MG tablet TAKE 1 TABLET DAILY (Patient taking differently: Take 10 mg by mouth once daily) 90 tablet 3  . ferrous sulfate 325 (65 FE) MG tablet Take 1 tablet (325 mg total) by mouth daily with breakfast.  3  . furosemide (LASIX) 20 MG tablet TAKE 1 TABLET DAILY (Patient taking differently: Take 20 mg by mouth once daily) 90 tablet 0  . losartan (COZAAR) 100 MG tablet TAKE 1 TABLET DAILY (Patient taking differently: Take 100 mg by mouth once daily) 90 tablet 3  . LYSINE PO Take 1 tablet by mouth daily.     . methocarbamol (ROBAXIN) 500 MG tablet Take 1 tablet (500 mg total) by mouth every 6 (six) hours as needed for muscle spasms. 30 tablet 0  . metoprolol succinate (TOPROL-XL) 100 MG 24 hr tablet Take 1 tablet (100 mg total) by mouth 2 (two) times daily. Take with or immediately following a meal. 180 tablet 1  . omeprazole (  PRILOSEC) 20 MG capsule Take 20 mg by mouth daily.      . potassium chloride (K-DUR,KLOR-CON) 10 MEQ tablet TAKE 1 TABLET TWICE A DAY (Patient taking differently: Take 10 meq by mouth twice daily) 180 tablet 3  . Tiotropium Bromide Monohydrate (SPIRIVA RESPIMAT) 1.25 MCG/ACT AERS Inhale 2 puffs into the lungs daily.     Alveda Reasons 20 MG TABS tablet TAKE 1 TABLET DAILY WITH SUPPER 90 tablet 1   No current facility-administered medications for this visit.     Allergies  Allergen Reactions  . Penicillins Rash and Other (See Comments)    "Blistering rash" Has patient had a PCN reaction causing immediate rash, facial/tongue/throat  swelling, SOB or lightheadedness with hypotension: Yes Has patient had a PCN reaction causing severe rash involving mucus membranes or skin necrosis: No Has patient had a PCN reaction that required hospitalization: No Has patient had a PCN reaction occurring within the last 10 years: No If all of the above answers are "NO", then may proceed with Cephalosporin use.   . Amlodipine Other (See Comments)    Peripheral edema  . Lisinopril Cough  . Zocor [Simvastatin] Other (See Comments)    LFT increase with simvastatin and lovastatin   . Oseltamivir Phosphate Itching and Rash    Past Medical History:  Diagnosis Date  . History of arthritis   . History of rheumatic fever   . Hypercholesterolemia   . Hypertension   . Nocturia   . PAF (paroxysmal atrial fibrillation) [facility]) January 2013   Placed on Pradaxa. Did not require cardioversion; spontaneously converted  . Peripheral edema   . Right bundle branch block   . SOB (shortness of breath)   . Tendonitis of elbow, left     Social History   Socioeconomic History  . Marital status: Married    Spouse name: Not on file  . Number of children: Not on file  . Years of education: Not on file  . Highest education level: Not on file  Social Needs  . Financial resource strain: Not on file  . Food insecurity - worry: Not on file  . Food insecurity - inability: Not on file  . Transportation needs - medical: Not on file  . Transportation needs - non-medical: Not on file  Occupational History  . Occupation: retired    Fish farm manager: RETIRED    Comment: Art gallery manager  Tobacco Use  . Smoking status: Never Smoker  . Smokeless tobacco: Never Used  Substance and Sexual Activity  . Alcohol use: Yes    Alcohol/week: 1.2 oz    Types: 2 Cans of beer per week    Comment: 2 beers daily  . Drug use: No  . Sexual activity: No  Other Topics Concern  . Not on file  Social History Narrative   Lives with wife, no pets   Retired   Occ:  Art gallery manager   Edu: master's   Activity: golf   Diet: good water, fruits/vegetables daily     Family History  Problem Relation Age of Onset  . Heart attack Father   . COPD Father   . Hypertension Father   . Heart disease Brother   . COPD Brother   . Asthma Sister   . Stroke Neg Hx      Review of Systems: General: negative for chills, fever, night sweats or weight changes.  Cardiovascular: negative for chest pain, dyspnea on exertion, edema, orthopnea, palpitations, paroxysmal nocturnal dyspnea or shortness of breath Dermatological: negative for  rash Respiratory: negative for cough or wheezing Urologic: negative for hematuria Abdominal: negative for nausea, vomiting, diarrhea, bright red blood per rectum, melena, or hematemesis Neurologic: negative for visual changes, syncope, or dizziness All other systems reviewed and are otherwise negative except as noted above.    Blood pressure 118/68, pulse 77, height 5\' 11"  (1.803 m), weight 217 lb (98.4 kg), SpO2 97 %.  General appearance: alert, cooperative, no distress and pale Neck: no carotid bruit and no JVD Lungs: clear to auscultation bilaterally Heart: regular rate and rhythm Extremities: no significant ankle edema Skin: pale, cool, dry Neurologic: Grossly normal  EKG NSR, RBBB  ASSESSMENT AND PLAN:   DOE (dyspnea on exertion) DOE and exertional tachycardia while off Xarelto x two weeks- R/O PE He does not appear to be in CHF on exam, rhythm stable  Paroxysmal atrial fibrillation (HCC) NSR today but will check a 48 hour Holter to r/o PAF with exertion  Right bundle branch block Chronic  COPD mixed type (HCC) H/O moderate COPD  CAD (coronary artery disease), native coronary artery Known 70% LAD with negative Myoview and negative FFR  Nonischemic cardiomyopathy (HCC) Last EF 35-40% at cath 2017  S/P left TKA, distal mass excision S/P LTKR 02/09/17 with post op bleeding/. Xarelto has been on hold x 2  weeks  Chronic anticoagulation CHADS VASC=5   PLAN  I explained to the pt I thought there were two likely possibilities for his exertional dyspnea and tachycardia. One would be narcotic withdrawal symptoms, he stopped his pain medication 4 days ago., but the pt tell me he had the symptoms while taking pain medication. The other is a pulmonary embolism. We will try and track down the D-dimer ordered by Dr Eulah Pont yesterday. If we are unable to get this I will just send him to [facility] for a CTA.   Kerin Ransom PA-C 03/03/2017 12:00 PM   4:15 pm We were unable to obtain his D-dimer results and decided to sent the pt for CTA. This came back positive for bilateral pulmonary embolism with evidence of Rt heart strain. He has been instructed to go immediately to the ED at [facility] and he will comply, his son will drive him.   Kerin Ransom PA-C 03/03/2017 4:18 PM

## 2017-03-03 NOTE — Assessment & Plan Note (Signed)
H/O moderate COPD

## 2017-03-03 NOTE — Assessment & Plan Note (Signed)
Chronic. 

## 2017-03-03 NOTE — H&P (Signed)
History and Physical    Theodore [name] IWP:[id number] DOB: Oct 01, 1934 DOA: 03/03/2017  Referring MD/NP/PA: Tomi Bamberger  PCP: [name], MD   Outpatient Specialists: Dr [name] (orthopedics)   Patient coming from: Home  Chief Complaint: Shortness of breath and palpitation  HPI: Theodore [name] is a 83 y.o. male with medical history significant of atrial fibrillation on chronic Xarelto who recently had left knee surgery. He was taken off the Xarelto preoperatively but then restarted the day after surgery. Patient had significant bleed and was therefore taken off anticoagulation for the last 3 weeks. He started having significant exertional dyspnea with palpitation for the last 2-3 days. Patient was also feeling weak but denied any significant chest pain. He was brought into the ED where he was found to have bilateral pulmonary embolism. His left lower extremity where he had surgery in the knee is swollen and tender and warm. Patient has had decreased mobility because of the surgery. No prior DVTs or PE. No family history of thromboembolic phenomena. Patient has had ablation of his atrial fibrillation however his being careful of chronic anticoagulation due to high risk.  ED Course: Patient has CT angiogram of the chest showing bilateral pulmonary emboli. Patient initiated on heparin in the ER  Review of Systems: As per HPI otherwise 10 point review of systems negative.    Past Medical History:  Diagnosis Date  . History of arthritis   . History of rheumatic fever   . Hypercholesterolemia   . Hypertension   . Nocturia   . PAF (paroxysmal atrial fibrillation) [facility]) January 2013   Placed on Pradaxa. Did not require cardioversion; spontaneously converted  . Peripheral edema   . Right bundle branch block   . SOB (shortness of breath)   . Tendonitis of elbow, left     Past Surgical History:  Procedure Laterality Date  . ATRIAL FIBRILLATION ABLATION  12/19/2015  . BACK SURGERY  12/24/09     fusion C3-C4  . BACK SURGERY  2010   fusion L4-L5  . CARDIAC CATHETERIZATION  2009   NONOBSTRUCTIVE ATHERSCLEROTIC CORONARY DISEASE AND NORMAL  LV FUNCTION  . CARDIAC CATHETERIZATION N/A 09/08/2014   Procedure: Left Heart Cath and Coronary Angiography;  Surgeon: Belva Crome, MD;  Location: [place] [facility];  Service: Cardiovascular;  Laterality: N/A;  . CARDIAC CATHETERIZATION N/A 09/28/2015   Procedure: Left Heart Cath and Coronary Angiography;  Surgeon: Leonie Man, MD;  Location: [place] [facility];  Service: Cardiovascular;  Laterality: N/A;  . CARDIAC CATHETERIZATION N/A 09/28/2015   Procedure: Intravascular Pressure Wire/FFR Study;  Surgeon: Leonie Man, MD;  Location: [place] [facility];  Service: Cardiovascular;  Laterality: N/A;  . CARDIOVERSION N/A 10/23/2015   Procedure: CARDIOVERSION;  Surgeon: [name], MD;  Location: [facility] ENDOSCOPY;  Service: Cardiovascular;  Laterality: N/A;  . COLONOSCOPY  2013   per patient, rpt 5 yrs  . ELECTROPHYSIOLOGIC STUDY N/A 12/19/2015   Procedure: Atrial Fibrillation Ablation;  Surgeon: [name], MD;  Location: [place] [facility];  Service: Cardiovascular;  Laterality: N/A;  . KNEE ARTHROSCOPY Left 03/2016   Dr. [name]  . PATELLAR TENDON REPAIR Left 2008  . REPLACEMENT TOTAL KNEE Right 2006  . TOTAL KNEE ARTHROPLASTY Left 02/09/2017   Procedure: LEFT TOTAL KNEE ARTHROPLASTY, EXCISION LEFT DISTAL THIGH MASS;  Surgeon: [name], MD;  Location: [facility];  Service: Orthopedics;  Laterality: Left;  90 mins  . TRICEPS TENDON REPAIR Left 2013  reports that  has never smoked. he has never used smokeless tobacco. He reports that he drinks about 1.2 oz of alcohol per week. He reports that he does not use drugs.  Allergies  Allergen Reactions  . Penicillins Rash and Other (See Comments)    "Blistering rash" Has patient had a PCN reaction causing immediate rash, facial/tongue/throat swelling, SOB or lightheadedness with  hypotension: Yes Has patient had a PCN reaction causing severe rash involving mucus membranes or skin necrosis: No Has patient had a PCN reaction that required hospitalization: No Has patient had a PCN reaction occurring within the last 10 years: No If all of the above answers are "NO", then may proceed with Cephalosporin use.   . Amlodipine Swelling and Other (See Comments)    Peripheral edema  . Lisinopril Cough  . Mevacor [Lovastatin] Other (See Comments)    Caused cataracts and elevated liver enzymes  . Vicodin [Hydrocodone-Acetaminophen] Nausea And Vomiting  . Zocor [Simvastatin] Other (See Comments)    LFT increase with simvastatin and lovastatin   . Oseltamivir Phosphate Itching and Rash    Family History  Problem Relation Age of Onset  . Heart attack Father   . COPD Father   . Hypertension Father   . Heart disease Brother   . COPD Brother   . Asthma Sister   . Stroke Neg Hx     Prior to Admission medications   Medication Sig Start Date End Date Taking? Authorizing Provider  acetaminophen (TYLENOL) 500 MG tablet Take 2 tablets (1,000 mg total) by mouth every 6 (six) hours. Patient taking differently: Take 500 mg by mouth every 6 (six) hours.  02/11/17  Yes Babish, Rodman Key, PA-C  Ascorbic Acid (VITAMIN C PO) Take 1 tablet by mouth daily.    Yes [provider]  aspirin 81 MG chewable tablet Chew 81 mg by mouth 2 (two) times daily.   Yes [provider]  atorvastatin (LIPITOR) 40 MG tablet TAKE 1 TABLET DAILY Patient taking differently: Take 40 mg by mouth once daily 06/30/16  Yes Camnitz, Will Hassell Done, MD  Cholecalciferol (VITAMIN D3) 2000 units TABS Take 2,000 Units by mouth daily.   Yes [provider]  diltiazem (CARTIA XT) 180 MG 24 hr capsule Take 1 capsule (180 mg total) by mouth daily. 11/14/16  Yes Skeet Latch, MD  ezetimibe (ZETIA) 10 MG tablet TAKE 1 TABLET DAILY Patient taking differently: Take 10 mg by mouth once daily 06/30/16  Yes  Camnitz, Will Hassell Done, MD  ferrous sulfate 325 (65 FE) MG tablet Take 1 tablet (325 mg total) by mouth daily with breakfast. 03/02/17  Yes [name], MD  furosemide (LASIX) 20 MG tablet TAKE 1 TABLET DAILY Patient taking differently: Take 20 mg by mouth once daily 01/21/17  Yes Skeet Latch, MD  losartan (COZAAR) 100 MG tablet TAKE 1 TABLET DAILY Patient taking differently: Take 100 mg by mouth once daily 12/04/16  Yes Skeet Latch, MD  LYSINE PO Take 1 tablet by mouth daily.    Yes [provider]  methocarbamol (ROBAXIN) 500 MG tablet Take 1 tablet (500 mg total) by mouth every 6 (six) hours as needed for muscle spasms. Patient taking differently: Take 500 mg by mouth every 6 (six) hours.  02/11/17  Yes Babish, Rodman Key, PA-C  metoprolol succinate (TOPROL-XL) 100 MG 24 hr tablet Take 1 tablet (100 mg total) by mouth 2 (two) times daily. Take with or immediately following a meal. 11/12/16  Yes Skeet Latch, MD  omeprazole [facility])  20 MG capsule Take 20 mg by mouth daily.     Yes [provider]  potassium chloride (K-DUR,KLOR-CON) 10 MEQ tablet TAKE 1 TABLET TWICE A DAY Patient taking differently: Take 10 mEq by mouth two times a day 10/03/16  Yes Skeet Latch, MD  Tiotropium Bromide Monohydrate (SPIRIVA RESPIMAT) 1.25 MCG/ACT AERS Inhale 2 puffs into the lungs daily.  01/13/17  Yes [name], MD  XARELTO 20 MG TABS tablet TAKE 1 TABLET DAILY WITH SUPPER Patient taking differently: Take 20 mg by mouth once a day with supper 08/27/16   Skeet Latch, MD    Physical Exam: Vitals:   03/03/17 1730 03/03/17 1800 03/03/17 1830 03/03/17 1856  BP: 138/74 136/81 (!) 143/79 (!) 143/79  Pulse: 71 70 76 83  Resp: 16 16 14 19   Temp:      TempSrc:      SpO2: 99% 99% 100% 99%      Constitutional: NAD, calm, comfortable Vitals:   03/03/17 1730 03/03/17 1800 03/03/17 1830 03/03/17 1856  BP: 138/74 136/81 (!) 143/79 (!) 143/79  Pulse: 71 70 76 83    Resp: 16 16 14 19   Temp:      TempSrc:      SpO2: 99% 99% 100% 99%   Eyes: PERRL, lids and conjunctivae normal ENMT: Mucous membranes are moist. Posterior pharynx clear of any exudate or lesions.Normal dentition.  Neck: normal, supple, no masses, no thyromegaly Respiratory: clear to auscultation bilaterally, no wheezing, no crackles. Normal respiratory effort. No accessory muscle use.  Cardiovascular: Regular rate and rhythm, no murmurs / rubs / gallops. No extremity edema. 2+ pedal pulses. No carotid bruits.  Abdomen: no tenderness, no masses palpated. No hepatosplenomegaly. Bowel sounds positive.  Musculoskeletal: no clubbing / cyanosis. Left lower extremity swollen with the knee scar visible but clearly . Good ROM, no contractures. Normal muscle tone.  Skin: no rashes, lesions, ulcers. No induration Neurologic: CN 2-12 grossly intact. Sensation intact, DTR normal. Strength 5/5 in all 4.  Psychiatric: Normal judgment and insight. Alert and oriented x 3. Normal mood.   Labs on Admission: I have personally reviewed following labs and imaging studies  CBC: Recent Labs  Lab 03/02/17 1355 03/03/17 1645  WBC 7.2 6.8  NEUTROABS 4.4 4.3  HGB 12.6* 11.8*  HCT 38.6* 37.5*  MCV 100.0 102.2*  PLT 249.0 277   Basic Metabolic Panel: Recent Labs  Lab 03/02/17 1355 03/03/17 1645  NA 132* 132*  K 4.3 3.9  CL 97 99*  CO2 25 21*  GLUCOSE 114* 141*  BUN 21 17  CREATININE 0.84 0.94  CALCIUM 9.5 9.1   GFR: Estimated Creatinine Clearance: 74.9 mL/min (by C-G formula based on SCr of 0.94 mg/dL). Liver Function Tests: No results for input(s): AST, ALT, ALKPHOS, BILITOT, PROT, ALBUMIN in the last 168 hours. No results for input(s): LIPASE, AMYLASE in the last 168 hours. No results for input(s): AMMONIA in the last 168 hours. Coagulation Profile: Recent Labs  Lab 03/03/17 1645  INR 1.11   Cardiac Enzymes: No results for input(s): CKTOTAL, CKMB, CKMBINDEX, TROPONINI in the last 168  hours. BNP (last 3 results) Recent Labs    03/02/17 1355  PROBNP 124.0*   HbA1C: No results for input(s): HGBA1C in the last 72 hours. CBG: No results for input(s): GLUCAP in the last 168 hours. Lipid Profile: No results for input(s): CHOL, HDL, LDLCALC, TRIG, CHOLHDL, LDLDIRECT in the last 72 hours. Thyroid Function Tests: Recent Labs    03/02/17 1355  TSH  1.43   Anemia Panel: No results for input(s): VITAMINB12, FOLATE, FERRITIN, TIBC, IRON, RETICCTPCT in the last 72 hours. Urine analysis: No results found for: COLORURINE, APPEARANCEUR, LABSPEC, PHURINE, GLUCOSEU, HGBUR, BILIRUBINUR, KETONESUR, PROTEINUR, UROBILINOGEN, NITRITE, LEUKOCYTESUR Sepsis Labs: @LABRCNTIP (procalcitonin:4,lacticidven:4) )No results found for this or any previous visit (from the past 240 hour(s)).   Radiological Exams on Admission: Ct Angio Chest Pe W Or Wo Contrast  Addendum Date: 03/03/2017   ADDENDUM REPORT: 03/03/2017 16:32 ADDENDUM: Critical Value/emergent results were called by telephone at the time of interpretation on 03/03/2017 at 4:23 p.m. to Kerin Ransom, [place] who verbally acknowledged these results. Electronically Signed   By: Marin Olp M.D.   On: 03/03/2017 16:32   Result Date: 03/03/2017 CLINICAL DATA:  Knee replacement 3 weeks ago. Increased shortness of breath with exertion over the past 2 weeks with bilateral leg swelling. EXAM: CT ANGIOGRAPHY CHEST WITH CONTRAST TECHNIQUE: Multidetector CT imaging of the chest was performed using the standard protocol during bolus administration of intravenous contrast. Multiplanar CT image reconstructions and MIPs were obtained to evaluate the vascular anatomy. CONTRAST:  4mL ISOVUE-370 IOPAMIDOL (ISOVUE-370) INJECTION 76% COMPARISON:  None. FINDINGS: Cardiovascular: Mild cardiomegaly. There is calcified plaque over the 3 vessel coronary arteries. Thoracic aorta is otherwise within normal. Pulmonary arterial system is well opacified as there is emboli over  the distal right main pulmonary artery extending into the proximal upper, middle and lower lobar arteries. There is also emboli over the proximal left lower lobar arteries. RV/LV ratio is 66/50 = 1.32. Mediastinum/Nodes: No evidence of mediastinal or hilar adenopathy. Remaining mediastinal structures are within normal. Lungs/Pleura: Lungs are well inflated with minimal linear atelectasis versus scarring adjacent the left major fissure. No evidence of focal airspace consolidation or effusion. Airways are within normal. Upper Abdomen: No acute findings. Musculoskeletal: Mild degenerate change of the spine. Review of the MIP images confirms the above findings. IMPRESSION: Bilateral lobar pulmonary emboli. Positive for acute PE with CTevidence of right heart strain (RV/LV Ratio = 1.32) consistent with at least submassive (intermediate risk) PE. The presence of right heart strain has been associated with an increased risk of morbidity and mortality. Mild cardiomegaly. Three vessel atherosclerotic coronary artery disease. Electronically Signed: By: Marin Olp M.D. On: 03/03/2017 16:05    EKG: Independently reviewed.   Assessment/Plan Active Problems:   GERD (gastroesophageal reflux disease)   CAD (coronary artery disease), native coronary artery   Obesity, Class I, BMI 30.0-34.9 (see actual BMI)   Chronic anticoagulation   Pulmonary embolism (HCC)    #1 bilateral pulmonary emboli: This appears to be submassive. There is CT evidence of right heart strain. Patient be initiated on IV heparin and observe for possible bleeding especially in the right knee. According to patient he was supposed to resume his Xarelto this Friday anyway so if there is no episode of bleeding overnight he may be restarted on Xarelto since he tolerated well in the past.  #2 recent knee surgery: Dr. [name] is his orthopedic surgeon. He will need to be consulted in the morning for guidance regarding his postoperative care  #3  history of paroxysmal atrial fibrillation: His heart rate is controlled. Resume anticoagulation as above.  #4 coronary artery disease: Stable. Patient will be on telemetry monitoring. We may consider echocardiogram with his bilateral PE.  #5 GERD: Continue PPIs.   DVT prophylaxis: Heparin   Code Status: Full  Family Communication: Patient's 3 children who are at baseline   Disposition Plan: Home on anticoagulation  Consults called: None  Admission status: Inpatient   Severity of Illness: The appropriate patient status for this patient is INPATIENT. Inpatient status is judged to be reasonable and necessary in order to provide the required intensity of service to ensure the patient's safety. The patient's presenting symptoms, physical exam findings, and initial radiographic and laboratory data in the context of their chronic comorbidities is felt to place them at high risk for further clinical deterioration. Furthermore, it is not anticipated that the patient will be medically stable for discharge from the hospital within 2 midnights of admission. The following factors support the patient status of inpatient.   " The patient's presenting symptoms include palpitation. " The worrisome physical exam findings include tachycardia. " The initial radiographic and laboratory data are worrisome because of Bilateral PE. " The chronic co-morbidities include Paroxysmal A. fib.   * I certify that at the point of admission it is my clinical judgment that the patient will require inpatient hospital care spanning beyond 2 midnights from the point of admission due to high intensity of service, high risk for further deterioration and high frequency of surveillance required.Barbette Merino MD [facility] Pager [phone]  If 7PM-7AM, please contact night-coverage [email or web address] Password [facility]  03/03/2017, 7:06 PM

## 2017-03-03 NOTE — ED Provider Notes (Signed)
[facility] EMERGENCY DEPARTMENT Provider Note   CSN: [id number] Arrival date & time: 03/03/17  1630     History   Chief Complaint No chief complaint on file.   HPI Theodore [name] is a 83 y.o. male.  HPI Patient presents to the emergency room for evaluation of a pulmonary embolism.  Patient has history of paroxysmal atrial fibrillation.  He also has history of recent left knee surgery.  Patient was taken off his anticoagulants and switched to aspirin at the end of December because he was having bleeding from his surgical wound.  Patient was seen by his primary doctor because he started having trouble with shortness of breath.  This worsened with exertion.  Patient was not having any chest pain.  He had outpatient laboratory test performed by his primary doctor yesterday.  Patient had a d-dimer sent however it was clotted.  He was seen in his cardiologist office today for further evaluation of his shortness of breath.  Arrange for an outpatient CT scan that showed bilateral pulmonary embolisms.  Patient was sent into the emergency room to be admitted to the hospital for further treatment. Past Medical History:  Diagnosis Date  . History of arthritis   . History of rheumatic fever   . Hypercholesterolemia   . Hypertension   . Nocturia   . PAF (paroxysmal atrial fibrillation) [facility]) January 2013   Placed on Pradaxa. Did not require cardioversion; spontaneously converted  . Peripheral edema   . Right bundle branch block   . SOB (shortness of breath)   . Tendonitis of elbow, left     Patient Active Problem List   Diagnosis Date Noted  . Nonischemic cardiomyopathy ([place]) 03/03/2017  . Chronic anticoagulation 03/03/2017  . S/P left TKA, distal mass excision 02/09/2017  . Obesity, Class I, BMI 30.0-34.9 (see actual BMI) 01/13/2017  . Peripheral edema   . History of rheumatic fever   . History of arthritis   . Tendonitis of elbow, left   . COPD exacerbation ([place])  09/16/2016  . Health maintenance examination 02/06/2016  . Advanced care planning/counseling discussion 02/06/2016  . CAD (coronary artery disease), native coronary artery 09/28/2015  . Angina, class III ([place]) - with Dyspnea as major Sx. 09/28/2015  . Angina decubitus ([place])   . Abnormal nuclear stress test 09/06/2014  . Thrombocytopenia ([place]) 05/27/2013  . PVC's (premature ventricular contractions) 09/22/2012  . Insomnia 09/22/2012  . Osteoarthritis 06/18/2012  . Allergic-infective asthma 08/26/2011  . GERD (gastroesophageal reflux disease) 08/26/2011  . Paroxysmal atrial fibrillation ([place]) 01/21/2011  . Nocturia   . Right bundle branch block   . COPD mixed type ([place]) 12/28/2007  . HLD (hyperlipidemia) 11/27/2007  . Essential hypertension 11/27/2007  . DOE (dyspnea on exertion) 11/18/2007    Past Surgical History:  Procedure Laterality Date  . ATRIAL FIBRILLATION ABLATION  12/19/2015  . BACK SURGERY  12/24/09   fusion C3-C4  . BACK SURGERY  2010   fusion L4-L5  . CARDIAC CATHETERIZATION  2009   NONOBSTRUCTIVE ATHERSCLEROTIC CORONARY DISEASE AND NORMAL  LV FUNCTION  . CARDIAC CATHETERIZATION N/A 09/08/2014   Procedure: Left Heart Cath and Coronary Angiography;  Surgeon: Belva Crome, MD;  Location: [facility] CV LAB;  Service: Cardiovascular;  Laterality: N/A;  . CARDIAC CATHETERIZATION N/A 09/28/2015   Procedure: Left Heart Cath and Coronary Angiography;  Surgeon: Leonie Man, MD;  Location: [place] [place] [facility] LAB;  Service: Cardiovascular;  Laterality: N/A;  . CARDIAC CATHETERIZATION N/A 09/28/2015  Procedure: Intravascular Pressure Wire/FFR Study;  Surgeon: Leonie Man, MD;  Location: [facility] LAB;  Service: Cardiovascular;  Laterality: N/A;  . CARDIOVERSION N/A 10/23/2015   Procedure: CARDIOVERSION;  Surgeon: [name], MD;  Location: [facility] ENDOSCOPY;  Service: Cardiovascular;  Laterality: N/A;  . COLONOSCOPY  2013   per patient, rpt 5 yrs  .  ELECTROPHYSIOLOGIC STUDY N/A 12/19/2015   Procedure: Atrial Fibrillation Ablation;  Surgeon: [name], MD;  Location: [facility] CV LAB;  Service: Cardiovascular;  Laterality: N/A;  . KNEE ARTHROSCOPY Left 03/2016   Dr. [name]  . PATELLAR TENDON REPAIR Left 2008  . REPLACEMENT TOTAL KNEE Right 2006  . TOTAL KNEE ARTHROPLASTY Left 02/09/2017   Procedure: LEFT TOTAL KNEE ARTHROPLASTY, EXCISION LEFT DISTAL THIGH MASS;  Surgeon: [name], MD;  Location: [facility] ORS;  Service: Orthopedics;  Laterality: Left;  90 mins  . TRICEPS TENDON REPAIR Left 2013       Home Medications    Prior to Admission medications   Medication Sig Start Date End Date Taking? Authorizing Provider  acetaminophen (TYLENOL) 500 MG tablet Take 2 tablets (1,000 mg total) by mouth every 6 (six) hours. 02/11/17   Danae Orleans, PA-C  Ascorbic Acid (VITAMIN C PO) Take 1 tablet by mouth daily.     [provider]  aspirin 81 MG chewable tablet Chew 81 mg by mouth 2 (two) times daily.    [provider]  atorvastatin (LIPITOR) 40 MG tablet TAKE 1 TABLET DAILY Patient taking differently: Take 40 mg by mouth once daily 06/30/16   Camnitz, Ocie Doyne, MD  Cholecalciferol (VITAMIN D3) 2000 units TABS Take 2,000 Units by mouth daily.    [provider]  diltiazem (CARTIA XT) 180 MG 24 hr capsule Take 1 capsule (180 mg total) by mouth daily. 11/14/16   Skeet Latch, MD  ezetimibe (ZETIA) 10 MG tablet TAKE 1 TABLET DAILY Patient taking differently: Take 10 mg by mouth once daily 06/30/16   Camnitz, Ocie Doyne, MD  ferrous sulfate 325 (65 FE) MG tablet Take 1 tablet (325 mg total) by mouth daily with breakfast. 03/02/17   [name], MD  furosemide (LASIX) 20 MG tablet TAKE 1 TABLET DAILY Patient taking differently: Take 20 mg by mouth once daily 01/21/17   Skeet Latch, MD  losartan (COZAAR) 100 MG tablet TAKE 1 TABLET DAILY Patient taking differently: Take 100 mg by mouth once  daily 12/04/16   Skeet Latch, MD  LYSINE PO Take 1 tablet by mouth daily.     [provider]  methocarbamol (ROBAXIN) 500 MG tablet Take 1 tablet (500 mg total) by mouth every 6 (six) hours as needed for muscle spasms. 02/11/17   Danae Orleans, PA-C  metoprolol succinate (TOPROL-XL) 100 MG 24 hr tablet Take 1 tablet (100 mg total) by mouth 2 (two) times daily. Take with or immediately following a meal. 11/12/16   Skeet Latch, MD  omeprazole (PRILOSEC) 20 MG capsule Take 20 mg by mouth daily.      [provider]  potassium chloride (K-DUR,KLOR-CON) 10 MEQ tablet TAKE 1 TABLET TWICE A DAY Patient taking differently: Take 10 meq by mouth two times a day 10/03/16   Skeet Latch, MD  Tiotropium Bromide Monohydrate (SPIRIVA RESPIMAT) 1.25 MCG/ACT AERS Inhale 2 puffs into the lungs daily.  01/13/17   [name], MD  XARELTO 20 MG TABS tablet TAKE 1 TABLET DAILY WITH SUPPER 08/27/16   Skeet Latch, MD    Family History  Family History  Problem Relation Age of Onset  . Heart attack Father   . COPD Father   . Hypertension Father   . Heart disease Brother   . COPD Brother   . Asthma Sister   . Stroke Neg Hx     Social History Social History   Tobacco Use  . Smoking status: Never Smoker  . Smokeless tobacco: Never Used  Substance Use Topics  . Alcohol use: Yes    Alcohol/week: 1.2 oz    Types: 2 Cans of beer per week    Comment: 2 beers daily  . Drug use: No     Allergies   Penicillins; Amlodipine; Lisinopril; Mevacor [lovastatin]; Zocor [simvastatin]; and Oseltamivir phosphate   Review of Systems Review of Systems  All other systems reviewed and are negative.    Physical Exam Updated Vital Signs BP (!) 152/78   Pulse 76   Temp 97.7 F (36.5 C) (Oral)   Resp 18   SpO2 98%   Physical Exam  Constitutional: He appears well-developed and well-nourished. No distress.  HENT:  Head: Normocephalic and atraumatic.  Right Ear:  External ear normal.  Left Ear: External ear normal.  Eyes: Conjunctivae are normal. Right eye exhibits no discharge. Left eye exhibits no discharge. No scleral icterus.  Neck: Neck supple. No tracheal deviation present.  Cardiovascular: Normal rate, regular rhythm and intact distal pulses.  Pulmonary/Chest: Effort normal and breath sounds normal. No stridor. No respiratory distress. He has no wheezes. He has no rales.  Abdominal: Soft. Bowel sounds are normal. He exhibits no distension. There is no tenderness. There is no rebound and no guarding.  Musculoskeletal: He exhibits edema. He exhibits no tenderness.  Edema of the bilateral lower extremities, status post knee surgery on the left, no signs erythema or drainage  Neurological: He is alert. He has normal strength. No cranial nerve deficit (no facial droop, extraocular movements intact, no slurred speech) or sensory deficit. He exhibits normal muscle tone. He displays no seizure activity. Coordination normal.  Skin: Skin is warm and dry. No rash noted.  Psychiatric: He has a normal mood and affect.  Nursing note and vitals reviewed.    ED Treatments / Results  Labs (all labs ordered are listed, but only abnormal results are displayed) Labs Reviewed  CBC WITH DIFFERENTIAL/PLATELET  BASIC METABOLIC PANEL  PROTIME-INR  APTT  I-STAT TROPONIN, ED    EKG Patient had an EKG in the cardiologist office today as well as at the primary care doctor's office yesterday, not repeated in the ED Radiology Ct Angio Chest Pe W Or Wo Contrast  Addendum Date: 03/03/2017   ADDENDUM REPORT: 03/03/2017 16:32 ADDENDUM: Critical Value/emergent results were called by telephone at the time of interpretation on 03/03/2017 at 4:23 p.m. to Kerin Ransom, [place] who verbally acknowledged these results. Electronically Signed   By: Marin Olp M.D.   On: 03/03/2017 16:32   Result Date: 03/03/2017 CLINICAL DATA:  Knee replacement 3 weeks ago. Increased shortness of  breath with exertion over the past 2 weeks with bilateral leg swelling. EXAM: CT ANGIOGRAPHY CHEST WITH CONTRAST TECHNIQUE: Multidetector CT imaging of the chest was performed using the standard protocol during bolus administration of intravenous contrast. Multiplanar CT image reconstructions and MIPs were obtained to evaluate the vascular anatomy. CONTRAST:  73mL ISOVUE-370 IOPAMIDOL (ISOVUE-370) INJECTION 76% COMPARISON:  None. FINDINGS: Cardiovascular: Mild cardiomegaly. There is calcified plaque over the 3 vessel coronary arteries. Thoracic aorta is otherwise within normal. Pulmonary arterial system is  well opacified as there is emboli over the distal right main pulmonary artery extending into the proximal upper, middle and lower lobar arteries. There is also emboli over the proximal left lower lobar arteries. RV/LV ratio is 66/50 = 1.32. Mediastinum/Nodes: No evidence of mediastinal or hilar adenopathy. Remaining mediastinal structures are within normal. Lungs/Pleura: Lungs are well inflated with minimal linear atelectasis versus scarring adjacent the left major fissure. No evidence of focal airspace consolidation or effusion. Airways are within normal. Upper Abdomen: No acute findings. Musculoskeletal: Mild degenerate change of the spine. Review of the MIP images confirms the above findings. IMPRESSION: Bilateral lobar pulmonary emboli. Positive for acute PE with CTevidence of right heart strain (RV/LV Ratio = 1.32) consistent with at least submassive (intermediate risk) PE. The presence of right heart strain has been associated with an increased risk of morbidity and mortality. Mild cardiomegaly. Three vessel atherosclerotic coronary artery disease. Electronically Signed: By: Marin Olp M.D. On: 03/03/2017 16:05    Procedures .Critical Care Performed by: Dorie Rank, MD Authorized by: Dorie Rank, MD   Critical care provider statement:    Critical care time (minutes):  30   Critical care was time  spent personally by me on the following activities:  Discussions with consultants, evaluation of patient's response to treatment, examination of patient, ordering and performing treatments and interventions, ordering and review of laboratory studies, ordering and review of radiographic studies, pulse oximetry, re-evaluation of patient's condition, obtaining history from patient or surrogate and review of old charts   (including critical care time)  Medications Ordered in ED Medications - No data to display   Initial Impression / Assessment and Plan / ED Course  I have reviewed the triage vital signs and the nursing notes.  Pertinent labs & imaging results that were available during my care of the patient were reviewed by me and considered in my medical decision making (see chart for details).   Patient presents emergency room for further treatment of a pulmonary embolism.  CT scan does show the evidence of right heart strain.  Patient is hemodynamically stable in the emergency room.  He is not tachycardic.  He is not tachypneic or hypotensive.  I do not think there is any indication for TPA or other invasive measures at this time.  I have ordered IV heparin infusion.  I will consult with medical service for admission and further treatment.  Final Clinical Impressions(s) / ED Diagnoses   Final diagnoses:  PE (pulmonary thromboembolism) ([place])      Dorie Rank, MD 03/03/17 1721

## 2017-03-03 NOTE — Plan of Care (Signed)
Pt oriented to unit, plan of care, and method of reporting concerns. Verbalizes understanding of need to call for assistance prior to ambulation when necessary

## 2017-03-03 NOTE — ED Triage Notes (Signed)
PT arrives after outpt ct scan revealed bilateral PE's. PT endorses sob and weakness. Denies CP. Pt reports he had left knee replacement on 12/31 and xarelto was stopped and pt began taking 2 81mg  ASA a day.

## 2017-03-03 NOTE — Assessment & Plan Note (Signed)
S/P LTKR 02/09/17 with post op bleeding/. Xarelto has been on hold x 2 weeks

## 2017-03-04 ENCOUNTER — Inpatient Hospital Stay (HOSPITAL_COMMUNITY): Payer: Medicare Other

## 2017-03-04 DIAGNOSIS — I480 Paroxysmal atrial fibrillation: Secondary | ICD-10-CM | POA: Diagnosis not present

## 2017-03-04 DIAGNOSIS — I10 Essential (primary) hypertension: Secondary | ICD-10-CM

## 2017-03-04 DIAGNOSIS — Z7901 Long term (current) use of anticoagulants: Secondary | ICD-10-CM | POA: Diagnosis not present

## 2017-03-04 DIAGNOSIS — I2609 Other pulmonary embolism with acute cor pulmonale: Secondary | ICD-10-CM

## 2017-03-04 DIAGNOSIS — I361 Nonrheumatic tricuspid (valve) insufficiency: Secondary | ICD-10-CM | POA: Diagnosis not present

## 2017-03-04 DIAGNOSIS — I2510 Atherosclerotic heart disease of native coronary artery without angina pectoris: Secondary | ICD-10-CM | POA: Diagnosis not present

## 2017-03-04 DIAGNOSIS — J449 Chronic obstructive pulmonary disease, unspecified: Secondary | ICD-10-CM

## 2017-03-04 DIAGNOSIS — D62 Acute posthemorrhagic anemia: Secondary | ICD-10-CM | POA: Diagnosis not present

## 2017-03-04 DIAGNOSIS — K219 Gastro-esophageal reflux disease without esophagitis: Secondary | ICD-10-CM

## 2017-03-04 DIAGNOSIS — E7800 Pure hypercholesterolemia, unspecified: Secondary | ICD-10-CM

## 2017-03-04 LAB — ECHOCARDIOGRAM COMPLETE
HEIGHTINCHES: 71 in
WEIGHTICAEL: 3418.01 [oz_av]

## 2017-03-04 LAB — COMPREHENSIVE METABOLIC PANEL
ALBUMIN: 3.1 g/dL — AB (ref 3.5–5.0)
ALT: 16 U/L — ABNORMAL LOW (ref 17–63)
ANION GAP: 9 (ref 5–15)
AST: 21 U/L (ref 15–41)
Alkaline Phosphatase: 74 U/L (ref 38–126)
BUN: 13 mg/dL (ref 6–20)
CO2: 23 mmol/L (ref 22–32)
Calcium: 8.9 mg/dL (ref 8.9–10.3)
Chloride: 102 mmol/L (ref 101–111)
Creatinine, Ser: 0.83 mg/dL (ref 0.61–1.24)
GFR calc non Af Amer: >60 mL/min (ref >60)
Glucose, Bld: 118 mg/dL — ABNORMAL HIGH (ref 65–99)
POTASSIUM: 3.9 mmol/L (ref 3.5–5.1)
SODIUM: 134 mmol/L — AB (ref 135–145)
Total Bilirubin: 0.9 mg/dL (ref 0.3–1.2)
Total Protein: 5.4 g/dL — ABNORMAL LOW (ref 6.5–8.1)

## 2017-03-04 LAB — HEPARIN LEVEL (UNFRACTIONATED)
Heparin Unfractionated: 0.82 IU/mL — ABNORMAL HIGH (ref 0.30–0.70)
Heparin Unfractionated: 1.02 IU/mL — ABNORMAL HIGH (ref 0.30–0.70)

## 2017-03-04 LAB — CBC
HCT: 34.1 % — ABNORMAL LOW (ref 39.0–52.0)
HEMOGLOBIN: 10.8 g/dL — AB (ref 13.0–17.0)
MCH: 32 pg (ref 26.0–34.0)
MCHC: 31.7 g/dL (ref 30.0–36.0)
MCV: 100.9 fL — ABNORMAL HIGH (ref 78.0–100.0)
Platelets: 186 10*3/uL (ref 150–400)
RBC: 3.38 MIL/uL — AB (ref 4.22–5.81)
RDW: 17.2 % — ABNORMAL HIGH (ref 11.5–15.5)
WBC: 5.5 10*3/uL (ref 4.0–10.5)

## 2017-03-04 MED ORDER — HYDROCORTISONE 1 % EX CREA
1.0000 "application " | TOPICAL_CREAM | Freq: Three times a day (TID) | CUTANEOUS | Status: DC | PRN
  Administered 2017-03-04: 1 via TOPICAL
  Filled 2017-03-04: qty 28

## 2017-03-04 MED ORDER — RIVAROXABAN 15 MG PO TABS
15.0000 mg | ORAL_TABLET | Freq: Two times a day (BID) | ORAL | Status: DC
  Administered 2017-03-04: 15 mg via ORAL
  Filled 2017-03-04: qty 1

## 2017-03-04 MED ORDER — RIVAROXABAN 15 MG PO TABS: 15.0000 mg | ORAL_TABLET | Freq: Two times a day (BID) | ORAL | 0 refills | Status: DC

## 2017-03-04 MED ORDER — RIVAROXABAN 20 MG PO TABS: ORAL_TABLET | ORAL | 1 refills | Status: DC

## 2017-03-04 NOTE — Progress Notes (Signed)
  Echocardiogram 2D Echocardiogram has been performed.  Theodore [name] 03/04/2017, 10:56 AM

## 2017-03-04 NOTE — Discharge Summary (Signed)
Physician Discharge Summary  Theodore [name] QVZ:[id number] DOB: [birth year] DOA: 03/03/2017  PCP: [name], MD  Admit date: 03/03/2017 Discharge date: 03/04/2017  Admitted From: Home Discharge disposition: Home   Recommendations for Outpatient Follow-Up:   1. Recommend close F/U with PCP and orthopedic surgeon.   Discharge Diagnosis:   Principal Problem:   Pulmonary embolism (HCC) with right heart strain Active Problems:   HLD (hyperlipidemia)   Essential hypertension   COPD mixed type (HCC)   Paroxysmal atrial fibrillation (HCC)   GERD (gastroesophageal reflux disease)   CAD (coronary artery disease), native coronary artery   S/P left TKA, distal mass excision   Chronic anticoagulation   Pulmonary emboli (HCC)   Acute blood loss anemia  Discharge Condition: Improved.  Diet recommendation: Low sodium, heart healthy.    CODE STATUS: Full  History of Present Illness:   Theodore [name] is an 83 y.o. male with a PMH of atrial fibrillation on chronic Xarelto, status post recent left knee surgery with interruption in anticoagulation 3 weeks due to bleeding complications who was admitted 03/03/17 for evaluation of a 2-3 day history of exertional dyspnea associated with palpitations. Upon initial evaluation in the ED, he was found to have bilateral pulmonary emboli.  Hospital Course by Problem:   Principal Problem:   Pulmonary embolism ([place]) with evidence of right heart strain by CT Placed on IV heparin. High risk for bleeding given recent surgery. 2 g drop in hemoglobin noted, but no obvious signs of bleeding. No clinical signs of ongoing bleeding. 2-D echo completed which does show right heart strain. Patient counseled on avoiding exertion. Feels well enough for discharge and he is hemodynamically stable, so will discharge home on Xarelto.  Active Problems:   S/P TKR 02/09/17/acute blood loss anemia Patient's left leg is swollen with extensive ecchymosis and  tenderness along the lower anterior tibia. I evaluated by orthopedic surgeon prior to discharge. WBC WNL. Appearance of leg is consistent with postoperative blood loss/hematoma/ecchymosis.    Paroxysmal atrial fibrillation (HCC) on chronic anticoagulation Continue Cardizem. Heart rate currently controlled. Anticoagulated with heparin. Will transition to [place] prior to discharge home.    GERD (gastroesophageal reflux disease) Continue Protonix.    CAD (coronary artery disease), native coronary artery Continue aspirin, statin.    COPD mixed type Respiratory status currently stable with mild shortness of breath attributed to pulmonary emboli.    Essential hypertension Continue Cardizem and Lasix.    Hyperlipidemia Continue Lipitor and Zetia.   Medical Consultants:    Orthopedic Surgery   Discharge Exam:   Vitals:   03/04/17 0500 03/04/17 1426  BP: (!) 129/58 118/66  Pulse: 69 73  Resp: 16   Temp: 98.1 F (36.7 C) 98.2 F (36.8 C)  SpO2: 94% 96%   Vitals:   03/03/17 2052 03/03/17 2123 03/04/17 0500 03/04/17 1426  BP: 135/84 (!) 154/90 (!) 129/58 118/66  Pulse: 84 100 69 73  Resp: 15 16 16    Temp:  98.3 F (36.8 C) 98.1 F (36.7 C) 98.2 F (36.8 C)  TempSrc:  Oral Oral Oral  SpO2: 99% 99% 94% 96%  Weight:  97.9 kg (215 lb 14.4 oz) 96.9 kg (213 lb 10 oz)   Height:  5\' 11"  (1.803 m)     General: No acute distress. Appears younger than stated age. Cardiovascular: Heart sounds show a regular rate, and rhythm. No gallops or rubs. No murmurs. No JVD. Lungs: Diminished breath sounds bilaterally with fair air movement. No rales, rhonchi  or wheezes. Abdomen: Soft, nontender, nondistended with normal active bowel sounds. No masses. No hepatosplenomegaly. Neurological: Alert and oriented 3. Moves all extremities 4 with equal strength. Cranial nerves II through XII grossly intact. Skin: Warm and dry. Extensive ecchymosis right leg. Extremities: Right lower  extremity swollen with anterior tibial erythema, significant knee swelling, and diffuse ecchymosis. Psychiatric: Mood and affect are normal. Insight and judgment are normal.    The results of significant diagnostics from this hospitalization (including imaging, microbiology, ancillary and laboratory) are listed below for reference.     Procedures and Diagnostic Studies:   Ct Angio Chest Pe W Or Wo Contrast  Addendum Date: 03/03/2017   ADDENDUM REPORT: 03/03/2017 16:32 ADDENDUM: Critical Value/emergent results were called by telephone at the time of interpretation on 03/03/2017 at 4:23 p.m. to Kerin Ransom, [place] who verbally acknowledged these results. Electronically Signed   By: Marin Olp M.D.   On: 03/03/2017 16:32   Result Date: 03/03/2017 CLINICAL DATA:  Knee replacement 3 weeks ago. Increased shortness of breath with exertion over the past 2 weeks with bilateral leg swelling. EXAM: CT ANGIOGRAPHY CHEST WITH CONTRAST TECHNIQUE: Multidetector CT imaging of the chest was performed using the standard protocol during bolus administration of intravenous contrast. Multiplanar CT image reconstructions and MIPs were obtained to evaluate the vascular anatomy. CONTRAST:  86mL ISOVUE-370 IOPAMIDOL (ISOVUE-370) INJECTION 76% COMPARISON:  None. FINDINGS: Cardiovascular: Mild cardiomegaly. There is calcified plaque over the 3 vessel coronary arteries. Thoracic aorta is otherwise within normal. Pulmonary arterial system is well opacified as there is emboli over the distal right main pulmonary artery extending into the proximal upper, middle and lower lobar arteries. There is also emboli over the proximal left lower lobar arteries. RV/LV ratio is 66/50 = 1.32. Mediastinum/Nodes: No evidence of mediastinal or hilar adenopathy. Remaining mediastinal structures are within normal. Lungs/Pleura: Lungs are well inflated with minimal linear atelectasis versus scarring adjacent the left major fissure. No evidence of focal  airspace consolidation or effusion. Airways are within normal. Upper Abdomen: No acute findings. Musculoskeletal: Mild degenerate change of the spine. Review of the MIP images confirms the above findings. IMPRESSION: Bilateral lobar pulmonary emboli. Positive for acute PE with CTevidence of right heart strain (RV/LV Ratio = 1.32) consistent with at least submassive (intermediate risk) PE. The presence of right heart strain has been associated with an increased risk of morbidity and mortality. Mild cardiomegaly. Three vessel atherosclerotic coronary artery disease. Electronically Signed: By: Marin Olp M.D. On: 03/03/2017 16:05     Labs:   Basic Metabolic Panel: Recent Labs  Lab 03/02/17 1355 03/03/17 1645 03/04/17 0201  NA 132* 132* 134*  K 4.3 3.9 3.9  CL 97 99* 102  CO2 25 21* 23  GLUCOSE 114* 141* 118*  BUN 21 17 13   CREATININE 0.84 0.94 0.83  CALCIUM 9.5 9.1 8.9   GFR Estimated Creatinine Clearance: 84.2 mL/min (by C-G formula based on SCr of 0.83 mg/dL). Liver Function Tests: Recent Labs  Lab 03/04/17 0201  AST 21  ALT 16*  ALKPHOS 74  BILITOT 0.9  PROT 5.4*  ALBUMIN 3.1*   Coagulation profile Recent Labs  Lab 03/03/17 1645  INR 1.11    CBC: Recent Labs  Lab 03/02/17 1355 03/03/17 1645 03/04/17 0201  WBC 7.2 6.8 5.5  NEUTROABS 4.4 4.3  --   HGB 12.6* 11.8* 10.8*  HCT 38.6* 37.5* 34.1*  MCV 100.0 102.2* 100.9*  PLT 249.0 211 186   D-Dimer Recent Labs    03/02/17 1414  DDIMER CANCELED   Thyroid function studies Recent Labs    03/02/17 1355  TSH 1.43    Discharge Instructions:   Discharge Instructions    Call MD for:  difficulty breathing, headache or visual disturbances   Complete by:  As directed    Call MD for:  extreme fatigue   Complete by:  As directed    Call MD for:  persistant dizziness or light-headedness   Complete by:  As directed    Call MD for:  redness, tenderness, or signs of infection (pain, swelling, redness, odor or  green/yellow discharge around incision site)   Complete by:  As directed    Call MD for:  severe uncontrolled pain   Complete by:  As directed    Diet - low sodium heart healthy   Complete by:  As directed    Discharge instructions   Complete by:  As directed    You have been given 2 different prescriptions for Xarelto.  One is for 15 mg which you will take twice a day for 21 days.  On 03/25/17, switch to the 20 mg tablets once a day thereafter.   Increase activity slowly   Complete by:  As directed      Allergies as of 03/04/2017      Reactions   Penicillins Rash, Other (See Comments)   "Blistering rash" Has patient had a PCN reaction causing immediate rash, facial/tongue/throat swelling, SOB or lightheadedness with hypotension: Yes Has patient had a PCN reaction causing severe rash involving mucus membranes or skin necrosis: No Has patient had a PCN reaction that required hospitalization: No Has patient had a PCN reaction occurring within the last 10 years: No If all of the above answers are "NO", then may proceed with Cephalosporin use.   Amlodipine Swelling, Other (See Comments)   Peripheral edema   Lisinopril Cough   Mevacor [lovastatin] Other (See Comments)   Caused cataracts and elevated liver enzymes   Vicodin [hydrocodone-acetaminophen] Nausea And Vomiting   Zocor [simvastatin] Other (See Comments)   LFT increase with simvastatin and lovastatin   Oseltamivir Phosphate Itching, Rash      Medication List    TAKE these medications   acetaminophen 500 MG tablet Commonly known as:  TYLENOL Take 2 tablets (1,000 mg total) by mouth every 6 (six) hours. What changed:  how much to take   aspirin 81 MG chewable tablet Chew 81 mg by mouth 2 (two) times daily.   atorvastatin 40 MG tablet Commonly known as:  LIPITOR TAKE 1 TABLET DAILY What changed:    how much to take  how to take this  when to take this   diltiazem 180 MG 24 hr capsule Commonly known as:  CARTIA  XT Take 1 capsule (180 mg total) by mouth daily.   ezetimibe 10 MG tablet Commonly known as:  ZETIA TAKE 1 TABLET DAILY What changed:    how much to take  how to take this  when to take this   ferrous sulfate 325 (65 FE) MG tablet Take 1 tablet (325 mg total) by mouth daily with breakfast.   furosemide 20 MG tablet Commonly known as:  LASIX TAKE 1 TABLET DAILY What changed:    how much to take  how to take this  when to take this   losartan 100 MG tablet Commonly known as:  COZAAR TAKE 1 TABLET DAILY What changed:    how much to take  how to take this  when to  take this   LYSINE PO Take 1 tablet by mouth daily.   methocarbamol 500 MG tablet Commonly known as:  ROBAXIN Take 1 tablet (500 mg total) by mouth every 6 (six) hours as needed for muscle spasms. What changed:  when to take this   metoprolol succinate 100 MG 24 hr tablet Commonly known as:  TOPROL-XL Take 1 tablet (100 mg total) by mouth 2 (two) times daily. Take with or immediately following a meal.   omeprazole 20 MG capsule Commonly known as:  PRILOSEC Take 20 mg by mouth daily.   potassium chloride 10 MEQ tablet Commonly known as:  K-DUR,KLOR-CON TAKE 1 TABLET TWICE A DAY What changed:    how much to take  how to take this  when to take this   Rivaroxaban 15 MG Tabs tablet Commonly known as:  XARELTO Take 1 tablet (15 mg total) by mouth 2 (two) times daily with a meal. What changed:  You were already taking a medication with the same name, and this prescription was added. Make sure you understand how and when to take each.   rivaroxaban 20 MG Tabs tablet Commonly known as:  XARELTO Take 15 mg twice a day x 21 days then resume 20 mg daily. What changed:  See the new instructions.   SPIRIVA RESPIMAT 1.25 MCG/ACT Aers Generic drug:  Tiotropium Bromide Monohydrate Inhale 2 puffs into the lungs daily.   VITAMIN C PO Take 1 tablet by mouth daily.   Vitamin D3 2000 units  Tabs Take 2,000 Units by mouth daily.         Time coordinating discharge: 35 minutes.  SignedMargreta Journey Rama  Pager [phone] [facility] 03/04/2017, 2:57 PM

## 2017-03-04 NOTE — Progress Notes (Signed)
[place] for Heparin Indication: pulmonary embolus  Allergies  Allergen Reactions  . Penicillins Rash and Other (See Comments)    "Blistering rash" Has patient had a PCN reaction causing immediate rash, facial/tongue/throat swelling, SOB or lightheadedness with hypotension: Yes Has patient had a PCN reaction causing severe rash involving mucus membranes or skin necrosis: No Has patient had a PCN reaction that required hospitalization: No Has patient had a PCN reaction occurring within the last 10 years: No If all of the above answers are "NO", then may proceed with Cephalosporin use.   . Amlodipine Swelling and Other (See Comments)    Peripheral edema  . Lisinopril Cough  . Mevacor [Lovastatin] Other (See Comments)    Caused cataracts and elevated liver enzymes  . Vicodin [Hydrocodone-Acetaminophen] Nausea And Vomiting  . Zocor [Simvastatin] Other (See Comments)    LFT increase with simvastatin and lovastatin   . Oseltamivir Phosphate Itching and Rash    Patient Measurements: Heparin Dosing Weight: 95.4kg  Vital Signs: Temp: 98.3 F (36.8 C) (01/22 2123) Temp Source: Oral (01/22 2123) BP: 154/90 (01/22 2123) Pulse Rate: 100 (01/22 2123)  Labs: Recent Labs    03/02/17 1355 03/03/17 1645 03/04/17 0201  HGB 12.6* 11.8* 10.8*  HCT 38.6* 37.5* 34.1*  PLT 249.0 211 186  APTT  --  31  --   LABPROT  --  14.3  --   INR  --  1.11  --   HEPARINUNFRC  --   --  0.82*  CREATININE 0.84 0.94  --     Estimated Creatinine Clearance: 74.7 mL/min (by C-G formula based on SCr of 0.94 mg/dL).   Medical History: Past Medical History:  Diagnosis Date  . History of arthritis   . History of rheumatic fever   . Hypercholesterolemia   . Hypertension   . Nocturia   . PAF (paroxysmal atrial fibrillation) [name]) January 2013   Placed on Pradaxa. Did not require cardioversion; spontaneously converted  . Peripheral edema   . Right bundle branch  block   . SOB (shortness of breath)   . Tendonitis of elbow, left     Medications:  Xarelto PTA dose 20mg  daily (pt report stopped taking on 02/12/17 due to incision bleeding from knee replacement on 02/09/17)  Assessment: 82yom presented to ED with bilateral PE. Pt has hx of Afib on Xarelto PTA (see above). Low Hgb at 12.6. Scr and Plt WNL.  Initial heparin level is SUPRAtherapeutic, hgb down slightly 10.8.  Goal of Therapy:  Heparin level 0.3-0.7 units/ml Monitor platelets by anticoagulation protocol: Yes   Plan:  -Reduce heparin to 1500 units/hr -Check confirmatory level in 8 hours  Man Bonneau, Jake Church 03/04/2017,3:35 AM

## 2017-03-04 NOTE — Care Management Obs Status (Signed)
[place] NOTIFICATION   Patient Details  Name: Theodore [name] MRN: [id number] Date of Birth: [birth year]   Medicare Observation Status Notification Given:  Yes    Theodore Collet, RN 03/04/2017, 3:50 PM

## 2017-03-04 NOTE — Progress Notes (Deleted)
Progress Note    Theodore [name]  POE:[id number] DOB: 1934/06/07  DOA: 03/03/2017 PCP: [name], MD    Brief Narrative:   Chief complaint: Follow-up dyspnea associated with palpitations  Medical records reviewed and are as summarized below:  Theodore [name] is an 83 y.o. male with a PMH of atrial fibrillation on chronic Xarelto, status post recent left knee surgery with interruption in anticoagulation 3 weeks due to bleeding complications who was admitted 03/03/17 for evaluation of a 2-3 day history of exertional dyspnea associated with palpitations. Upon initial evaluation in the ED, he was found to have bilateral pulmonary emboli.  Assessment/Plan:   Principal Problem:   Pulmonary embolism ([place]) with evidence of right heart strain by CT Placed on IV heparin. High risk for bleeding given recent surgery. 2 g drop in hemoglobin noted, but no obvious signs of bleeding. If hemoglobin stable times another 24 hours, can possibly switch him to Xarelto. Given findings concerning for right heart strain, will obtain 2-D echocardiogram.  Active Problems:   S/P TKR 02/09/17/acute blood loss anemia Patient's left leg is swollen with extensive ecchymosis and tenderness along the lower anterior tibia. I have requested that his orthopedic surgeon come and examine his left leg. WBC WNL. I'm concerned about the extensive bruising and need for blood thinners.    Paroxysmal atrial fibrillation (HCC) on chronic anticoagulation Continue Cardizem. Heart rate currently controlled. Anticoagulated with heparin.    GERD (gastroesophageal reflux disease) Continue Protonix.    CAD (coronary artery disease), native coronary artery Continue aspirin, statin.    COPD mixed type Respiratory status currently stable with mild shortness of breath attributed to pulmonary emboli.    Essential hypertension Continue Cardizem and Lasix.    Hyperlipidemia Continue Lipitor and Zetia.    Family  Communication/Anticipated D/C date and plan/Code Status   DVT prophylaxis: Heparin ordered. Code Status: Full Code.  Family Communication: Son at the bedside. Disposition Plan: Home within 24 hours if hemoglobin remains stable.   Medical Consultants:    Orthopedic Surgery.   Anti-Infectives:    None  Subjective:   Patient reports mild shortness of breath associated with a dry cough. No chest pain. No nausea.  Objective:    Vitals:   03/03/17 1921 03/03/17 2052 03/03/17 2123 03/04/17 0500  BP: 131/76 135/84 (!) 154/90 (!) 129/58  Pulse: 74 84 100 69  Resp: 18 15 16 16   Temp:   98.3 F (36.8 C) 98.1 F (36.7 C)  TempSrc:   Oral Oral  SpO2: 100% 99% 99% 94%  Weight:   97.9 kg (215 lb 14.4 oz) 96.9 kg (213 lb 10 oz)  Height:   5\' 11"  (1.803 m)     Intake/Output Summary (Last 24 hours) at 03/04/2017 0755 Last data filed at 03/04/2017 0734 Gross per 24 hour  Intake 584.9 ml  Output 1000 ml  Net -415.1 ml   Filed Weights   03/03/17 2123 03/04/17 0500  Weight: 97.9 kg (215 lb 14.4 oz) 96.9 kg (213 lb 10 oz)    Exam: General: No acute distress. Appears younger than stated age. Cardiovascular: Heart sounds show a regular rate, and rhythm. No gallops or rubs. No murmurs. No JVD. Lungs: Diminished breath sounds bilaterally with fair air movement. No rales, rhonchi or wheezes. Abdomen: Soft, nontender, nondistended with normal active bowel sounds. No masses. No hepatosplenomegaly. Neurological: Alert and oriented 3. Moves all extremities 4 with equal strength. Cranial nerves II through XII grossly intact. Skin: Warm and  dry. Extensive ecchymosis right leg. Extremities: Right lower extremity swollen with anterior tibial erythema, significant knee swelling, and diffuse ecchymosis. Psychiatric: Mood and affect are normal. Insight and judgment are normal.   Data Reviewed:   I have personally reviewed following labs and imaging studies:  Labs: Labs show the  following:   Basic Metabolic Panel: Recent Labs  Lab 03/02/17 1355 03/03/17 1645 03/04/17 0201  NA 132* 132* 134*  K 4.3 3.9 3.9  CL 97 99* 102  CO2 25 21* 23  GLUCOSE 114* 141* 118*  BUN 21 17 13   CREATININE 0.84 0.94 0.83  CALCIUM 9.5 9.1 8.9   GFR Estimated Creatinine Clearance: 84.2 mL/min (by C-G formula based on SCr of 0.83 mg/dL). Liver Function Tests: Recent Labs  Lab 03/04/17 0201  AST 21  ALT 16*  ALKPHOS 74  BILITOT 0.9  PROT 5.4*  ALBUMIN 3.1*   Coagulation profile Recent Labs  Lab 03/03/17 1645  INR 1.11    CBC: Recent Labs  Lab 03/02/17 1355 03/03/17 1645 03/04/17 0201  WBC 7.2 6.8 5.5  NEUTROABS 4.4 4.3  --   HGB 12.6* 11.8* 10.8*  HCT 38.6* 37.5* 34.1*  MCV 100.0 102.2* 100.9*  PLT 249.0 211 186   BNP (last 3 results) Recent Labs    03/02/17 1355  PROBNP 124.0*   Thyroid function studies: Recent Labs    03/02/17 1355  TSH 1.43    Microbiology No results found for this or any previous visit (from the past 240 hour(s)).  Procedures and diagnostic studies:  Ct Angio Chest Pe W Or Wo Contrast: 03/03/2017: My independent review of the image shows: Bilateral pulmonary emboli as pictured below. Formal reading is noted below.     Formal radiology read: Bilateral lobar pulmonary emboli. Positive for acute PE with CTevidence of right heart strain (RV/LV Ratio = 1.32) consistent with at least submassive (intermediate risk) PE. The presence of right heart strain has been associated with an increased risk of morbidity and mortality. Mild cardiomegaly. Three vessel atherosclerotic coronary artery disease. Electronically Signed: By: Marin Olp M.D. On: 03/03/2017 16:05    Medications:   . acetaminophen  500 mg Oral Q6H  . aspirin  81 mg Oral BID  . atorvastatin  40 mg Oral Daily  . cholecalciferol  2,000 Units Oral Daily  . diltiazem  180 mg Oral Daily  . docusate sodium  100 mg Oral BID  . ezetimibe  10 mg Oral Daily  . ferrous  sulfate  325 mg Oral Q breakfast  . furosemide  20 mg Oral Daily  . losartan  100 mg Oral Daily  . methocarbamol  500 mg Oral Q6H WA  . metoprolol succinate  100 mg Oral BID  . pantoprazole  40 mg Oral Daily  . potassium chloride  10 mEq Oral BID  . sodium chloride flush  3 mL Intravenous Q12H  . tiotropium  1 capsule Inhalation Daily  . vitamin C  500 mg Oral Daily   Continuous Infusions: . sodium chloride    . heparin 1,500 Units/hr (03/04/17 0734)     LOS: 1 day   Jacquelynn Cree  [facility] Pager [phone]. If unable to reach me by pager, please call my cell phone at [phone].  *Please refer to [email or web address], password [email or web address] to get updated schedule on who will round on this patient, as hospitalists switch teams weekly. If 7PM-7AM, please contact night-coverage at [email or web address], password [email or web address] for any overnight needs.  03/04/2017, 7:55 AM

## 2017-03-04 NOTE — Care Management CC44 (Signed)
Condition Code 44 Documentation Completed  Patient Details  Name: Theodore [name] MRN: [id number] Date of Birth: 1934-05-25   Condition Code 44 given:  Yes Patient signature on Condition Code 44 notice:  Yes Documentation of 2 MD's agreement:  Yes Code 44 added to claim:  Yes    Carles Collet, RN 03/04/2017, 3:50 PM

## 2017-03-04 NOTE — Progress Notes (Signed)
ANTICOAGULATION CONSULT NOTE - Follow Up Consult  Pharmacy Consult for Heparin Indication: afib and new B PE  Allergies  Allergen Reactions  . Penicillins Rash and Other (See Comments)    "Blistering rash" Has patient had a PCN reaction causing immediate rash, facial/tongue/throat swelling, SOB or lightheadedness with hypotension: Yes Has patient had a PCN reaction causing severe rash involving mucus membranes or skin necrosis: No Has patient had a PCN reaction that required hospitalization: No Has patient had a PCN reaction occurring within the last 10 years: No If all of the above answers are "NO", then may proceed with Cephalosporin use.   . Amlodipine Swelling and Other (See Comments)    Peripheral edema  . Lisinopril Cough  . Mevacor [Lovastatin] Other (See Comments)    Caused cataracts and elevated liver enzymes  . Vicodin [Hydrocodone-Acetaminophen] Nausea And Vomiting  . Zocor [Simvastatin] Other (See Comments)    LFT increase with simvastatin and lovastatin   . Oseltamivir Phosphate Itching and Rash    Patient Measurements: Height: 5\' 11"  (180.3 cm) Weight: 213 lb 10 oz (96.9 kg) IBW/kg (Calculated) : 75.3 Heparin Dosing Weight:  95.3 kg  Vital Signs: Temp: 98.1 F (36.7 C) (01/23 0500) Temp Source: Oral (01/23 0500) BP: 129/58 (01/23 0500) Pulse Rate: 69 (01/23 0500)  Labs: Recent Labs    03/02/17 1355 03/03/17 1645 03/04/17 0201 03/04/17 1112  HGB 12.6* 11.8* 10.8*  --   HCT 38.6* 37.5* 34.1*  --   PLT 249.0 211 186  --   APTT  --  31  --   --   LABPROT  --  14.3  --   --   INR  --  1.11  --   --   HEPARINUNFRC  --   --  0.82* 1.02*  CREATININE 0.84 0.94 0.83  --     Estimated Creatinine Clearance: 84.2 mL/min (by C-G formula based on SCr of 0.83 mg/dL).   Assessment:  Anticoag: heparin for new bilateral submassive PE with R heart strain. Hx of Afib on Xarelto PTA dose 20mg  daily (stopped Xarelto PTA on 02/12/17 due to incision bleeding from knee  replacement on 02/09/17. Not restarted, now with B PE). Hgb 12.6>11.8>10.8 today, Plts 186. Heparin level 0.82 now up to 1.02? Note swelling to leg with extensive ecchymosis. DVT? Lab drew level from opposite arm as observed by RN. No problems with the infusion. - 1/23: MD notes If hemoglobin stable times another 24 hours, can possibly switch him to Xarelto.   Goal of Therapy:  Heparin level 0.3-0.7 units/ml Monitor platelets by anticoagulation protocol: Yes   Plan:  Decrease IV heparin to 1200 units/hr Recheck heparin level in 8 hrs. Daily HL and CBC  Elyas Villamor S. Alford Highland, PharmD, BCPS Clinical Staff Pharmacist Pager [phone]  [place], [place] 03/04/2017,1:02 PM

## 2017-03-05 ENCOUNTER — Telehealth: Payer: Self-pay | Admitting: Cardiovascular Disease

## 2017-03-05 ENCOUNTER — Telehealth: Payer: Self-pay | Admitting: *Deleted

## 2017-03-05 NOTE — Telephone Encounter (Signed)
Theodore [name] is calling to find out if he is still supposed to have the monitor placed . But Will Like to speak to [name] ( states he will feel better if he can talk with her ) . Please call   Thanks

## 2017-03-05 NOTE — Telephone Encounter (Signed)
Per Lurena Joiner PA ok to cancel the monitor for now. Advised patient who confirmed he did not feel that he has had any recurrent Afib and has always been able to tell in the past. He will keep follow up next week with Dr [name] as scheduled.

## 2017-03-05 NOTE — Telephone Encounter (Signed)
Lm requesting return call to complete TCM and confirm hosp f/u appt  

## 2017-03-06 NOTE — Telephone Encounter (Signed)
Patient is extremely thankful and complimentary for Dr. Bosie Clos care and vigilance regarding his condition earlier in the week.  He wants to be sure that the doctor is aware of the high regard and appreciation he has for him and looks forward to seeing him at his hospital follow up on 03/17/17.

## 2017-03-06 NOTE — Telephone Encounter (Addendum)
Transition Care Management Follow-up Telephone Call   Date discharged?03/04/2017   How have you been since you were released from the hospital?  I am feeling so much better now.     Do you understand why you were in the hospital? Yes   Do you understand the discharge instructions? Yes   Where were you discharged to? Home and referred to therapy   Items Reviewed:  Medications reviewed: Yes  Allergies reviewed: Yes  Dietary changes reviewed: Yes  Referrals reviewed: Yes has appointment with cardiology on 03/10/17.  Orthopedist on 03/06/17.      Functional Questionnaire:   Activities of Daily Living (ADLs):   He states they are independent in the following: Patient is independent with all ADL's; however, wife/son present if anything is needed.    States they require assistance with the following:  He does use a cane for ambulatory assistance.     Any transportation issues/concerns?: No. He and wife are both driving. Son also present to accompany as needed.  Patient is off of strong pain medication and is easing back into daily routine.   Any patient concerns? No   Confirmed importance and date/time of follow-up visits scheduled Yes  Provider Appointment booked with Dr. Danise Mina 03/17/17 at 9:00am.    Confirmed with patient if condition begins to worsen call PCP or go to the ER.  Patient was given the office number and encouraged to call back with question or concerns:  YES

## 2017-03-07 NOTE — Telephone Encounter (Signed)
Thank you :)

## 2017-03-10 ENCOUNTER — Encounter: Payer: Self-pay | Admitting: Cardiovascular Disease

## 2017-03-10 ENCOUNTER — Ambulatory Visit (INDEPENDENT_AMBULATORY_CARE_PROVIDER_SITE_OTHER): Payer: Medicare Other | Admitting: Cardiovascular Disease

## 2017-03-10 VITALS — BP 118/76 | HR 74 | Ht 71.0 in | Wt 217.6 lb

## 2017-03-10 DIAGNOSIS — I10 Essential (primary) hypertension: Secondary | ICD-10-CM

## 2017-03-10 DIAGNOSIS — R0602 Shortness of breath: Secondary | ICD-10-CM

## 2017-03-10 DIAGNOSIS — I428 Other cardiomyopathies: Secondary | ICD-10-CM | POA: Diagnosis not present

## 2017-03-10 DIAGNOSIS — I5042 Chronic combined systolic (congestive) and diastolic (congestive) heart failure: Secondary | ICD-10-CM

## 2017-03-10 DIAGNOSIS — Z7901 Long term (current) use of anticoagulants: Secondary | ICD-10-CM | POA: Diagnosis not present

## 2017-03-10 DIAGNOSIS — I480 Paroxysmal atrial fibrillation: Secondary | ICD-10-CM

## 2017-03-10 MED ORDER — BENZONATATE 100 MG PO CAPS: 100.0000 mg | ORAL_CAPSULE | Freq: Three times a day (TID) | ORAL | 0 refills | Status: DC

## 2017-03-10 NOTE — Progress Notes (Signed)
Cardiology Office Note   Date:  03/10/2017   ID:  DOMINIK YORDY, DOB [birth year], MRN [id number]  PCP:  [name], MD  Cardiologist:   Skeet Latch, MD   Chief Complaint  Patient presents with  . Follow-up     History of Present Illness: Theodore [name] is a 83 y.o. male with chronic systolic and diastolic heart failure, hypertension, hyperlipidemia, paroxysmal atrial fibrillation, asthma and Rheumatic fever who presents for follow up.  Mr. Kloepfer was previously a patient of Dr. Mare Ferrari.  Mr. Odonohue was diagnosed with atrial fibrillation in 2014.  He was initially on Pradaxa and switched to Xarelto.  He had an episode of chest pain 08/2014 and had a Lexiscan Myoview 08/31/14 that showed an old inferior scar with peri-infarct ischemia and LVEF 49%.  He then had a LHC that revealed 70% ostial LAD, 50% prox LAD, 30% OM1 and 35% RCA.  He was managed medically.  He subsequently had an echo 09/2014 that showed LVEF 55-60% with mild MR and AR and grade 1 diastolic dysfunction.  At that time atorvastatin was increased to 80 mg but he noted leg pain.  He was seen in the lipid clinic and started back on atorvastatin 40mg  and zetia 10 mg.    Mr. Howland was seen in clinic 09/25/15 and reported significant shortness of breath with minimal exertion.  He had a LHC with Dr. Ellyn Hack on 09/28/15 that again revealed a 70% ostial-proximal LAD lesion.  Dr. Ellyn Hack performed FFR on that lesion and there was no significant change.  The left ventriculogram performed at that time revealed LVEF 35-45%.  Mr. Spadafore continued to have exertional dyspnea so he was referred for [facility] 10/17/15 that was negative for ischemia. He subsequently underwent DCCV on 10/23/15.  He underwent ablation on 12/19/15 with Dr. [name] and has remained in sinus rhythm.    Mr. Dessert underwent L knee replacement on  02/09/17.  Xarelto was held for 2 days prior to surgery and resumed post-operatively.  He developed bleeding at the  incision site so Xarelto was again held and he was switched to aspirin.  He subsequently developed a left lower extremity DVT and pulmonary embolism.  Xarelto was resumed.  He denies shortness of breath but has reported significant cough.  The swelling in his leg has improved.  He is working with physical therapy and feeling better.  He had an echocardiogram 03/04/17 that revealed LVEF 50-55% with moderate to severe pulmonary arterial hypertension with a PASP of 60 mmHg.   Past Medical History:  Diagnosis Date  . History of arthritis   . History of rheumatic fever   . Hypercholesterolemia   . Hypertension   . Nocturia   . PAF (paroxysmal atrial fibrillation) [facility]) January 2013   Placed on Pradaxa. Did not require cardioversion; spontaneously converted  . Peripheral edema   . Right bundle branch block   . SOB (shortness of breath)   . Tendonitis of elbow, left     Past Surgical History:  Procedure Laterality Date  . ATRIAL FIBRILLATION ABLATION  12/19/2015  . BACK SURGERY  12/24/09   fusion C3-C4  . BACK SURGERY  2010   fusion L4-L5  . CARDIAC CATHETERIZATION  2009   NONOBSTRUCTIVE ATHERSCLEROTIC CORONARY DISEASE AND NORMAL  LV FUNCTION  . CARDIAC CATHETERIZATION N/A 09/08/2014   Procedure: Left Heart Cath and Coronary Angiography;  Surgeon: Belva Crome, MD;  Location: [place] CV LAB;  Service: Cardiovascular;  Laterality: N/A;  .  CARDIAC CATHETERIZATION N/A 09/28/2015   Procedure: Left Heart Cath and Coronary Angiography;  Surgeon: Leonie Man, MD;  Location: [place] [facility];  Service: Cardiovascular;  Laterality: N/A;  . CARDIAC CATHETERIZATION N/A 09/28/2015   Procedure: Intravascular Pressure Wire/FFR Study;  Surgeon: Leonie Man, MD;  Location: [place] [facility];  Service: Cardiovascular;  Laterality: N/A;  . CARDIOVERSION N/A 10/23/2015   Procedure: CARDIOVERSION;  Surgeon: [name], MD;  Location: [facility] ENDOSCOPY;  Service: Cardiovascular;  Laterality: N/A;    . COLONOSCOPY  2013   per patient, rpt 5 yrs  . ELECTROPHYSIOLOGIC STUDY N/A 12/19/2015   Procedure: Atrial Fibrillation Ablation;  Surgeon: [name], MD;  Location: [place] [facility];  Service: Cardiovascular;  Laterality: N/A;  . KNEE ARTHROSCOPY Left 03/2016   Dr. [name]  . PATELLAR TENDON REPAIR Left 2008  . REPLACEMENT TOTAL KNEE Right 2006  . TOTAL KNEE ARTHROPLASTY Left 02/09/2017   Procedure: LEFT TOTAL KNEE ARTHROPLASTY, EXCISION LEFT DISTAL THIGH MASS;  Surgeon: [name], MD;  Location: [facility] ORS;  Service: Orthopedics;  Laterality: Left;  90 mins  . TRICEPS TENDON REPAIR Left 2013     Current Outpatient Medications  Medication Sig Dispense Refill  . acetaminophen (TYLENOL) 500 MG tablet Take 2 tablets (1,000 mg total) by mouth every 6 (six) hours. (Patient taking differently: Take 500 mg by mouth every 6 (six) hours. ) 30 tablet 0  . Ascorbic Acid (VITAMIN C PO) Take 1 tablet by mouth daily.     [name] atorvastatin (LIPITOR) 40 MG tablet TAKE 1 TABLET DAILY (Patient taking differently: Take 40 mg by mouth once daily) 90 tablet 3  . Cholecalciferol (VITAMIN D3) 2000 units TABS Take 2,000 Units by mouth daily.    [name] diltiazem (CARTIA XT) 180 MG 24 hr capsule Take 1 capsule (180 mg total) by mouth daily. 90 capsule 3  . ezetimibe (ZETIA) 10 MG tablet TAKE 1 TABLET DAILY (Patient taking differently: Take 10 mg by mouth once daily) 90 tablet 3  . ferrous sulfate 325 (65 FE) MG tablet Take 1 tablet (325 mg total) by mouth daily with breakfast.  3  . furosemide (LASIX) 20 MG tablet TAKE 1 TABLET DAILY (Patient taking differently: Take 20 mg by mouth once daily) 90 tablet 0  . losartan (COZAAR) 100 MG tablet TAKE 1 TABLET DAILY (Patient taking differently: Take 100 mg by mouth once daily) 90 tablet 3  . LYSINE PO Take 1 tablet by mouth daily.     . methocarbamol (ROBAXIN) 500 MG tablet Take 1 tablet (500 mg total) by mouth every 6 (six) hours as needed for muscle spasms. (Patient  taking differently: Take 500 mg by mouth every 6 (six) hours. ) 30 tablet 0  . metoprolol succinate (TOPROL-XL) 100 MG 24 hr tablet Take 1 tablet (100 mg total) by mouth 2 (two) times daily. Take with or immediately following a meal. 180 tablet 1  . omeprazole (PRILOSEC) 20 MG capsule Take 20 mg by mouth daily.      . potassium chloride (K-DUR,KLOR-CON) 10 MEQ tablet TAKE 1 TABLET TWICE A DAY (Patient taking differently: Take 10 mEq by mouth two times a day) 180 tablet 3  . Rivaroxaban (XARELTO) 15 MG TABS tablet Take 1 tablet (15 mg total) by mouth 2 (two) times daily with a meal. 42 tablet 0  . Tiotropium Bromide Monohydrate (SPIRIVA RESPIMAT) 1.25 MCG/ACT AERS Inhale 2 puffs into the lungs daily.     . benzonatate (TESSALON  PERLES) 100 MG capsule Take 1 capsule (100 mg total) by mouth 3 (three) times daily. 30 capsule 0  . rivaroxaban (XARELTO) 20 MG TABS tablet Take 15 mg twice a day x 21 days then resume 20 mg daily. (Patient not taking: Reported on 03/10/2017) 90 tablet 1   No current facility-administered medications for this visit.     Allergies:   Penicillins; Amlodipine; Lisinopril; Mevacor [lovastatin]; Vicodin [hydrocodone-acetaminophen]; Zocor [simvastatin]; and Oseltamivir phosphate    Social History:  The patient  reports that  has never smoked. he has never used smokeless tobacco. He reports that he drinks about 1.2 oz of alcohol per week. He reports that he does not use drugs.   Family History:  The patient's family history includes Asthma in his sister; COPD in his brother and father; Heart attack in his father; Heart disease in his brother; Hypertension in his father.    ROS:  Please see the history of present illness.   Otherwise, review of systems are positive for none.   All other systems are reviewed and negative.    PHYSICAL EXAM: VS:  BP 118/76   Pulse 74   Ht 5\' 11"  (1.803 m)   Wt 217 lb 9.6 oz (98.7 kg)   BMI 30.35 kg/m  , BMI Body mass index is 30.35  kg/m. GENERAL:  Well appearing HEENT: Pupils equal round and reactive, fundi not visualized, oral mucosa unremarkable NECK:  No jugular venous distention, waveform within normal limits, carotid upstroke brisk and symmetric, no bruits, no thyromegaly LYMPHATICS:  No cervical adenopathy LUNGS:  Clear to auscultation bilaterally HEART:  RRR.  PMI not displaced or sustained,S1 and S2 within normal limits, no S3, no S4, no clicks, no rubs, no murmurs ABD:  Flat, positive bowel sounds normal in frequency in pitch, no bruits, no rebound, no guarding, no midline pulsatile mass, no hepatomegaly, no splenomegaly EXT:  2 plus pulses throughout, 1+ RLE edema, no cyanosis no clubbing SKIN:  No rashes no nodules NEURO:  Cranial nerves II through XII grossly intact, motor grossly intact throughout PSYCH:  Cognitively intact, oriented to person place and time   EKG:  EKG is not ordered today. 10/30/15: Sinus rhythm.  Rate 60 bpm.  RBBB.  LAD.  The ekg ordered 09/25/15 demonstrates atrial fibrillation.  Rate 84 bpm.   Echo 03/04/17: Study Conclusions  - Left ventricle: The cavity size was normal. There was mild   concentric hypertrophy. Systolic function was normal. The   estimated ejection fraction was in the range of 50% to 55%. Wall   motion was normal; there were no regional wall motion   abnormalities. - Ventricular septum: The contour showed diastolic flattening. - Aortic valve: There was trivial regurgitation. - Pulmonary arteries: Systolic pressure was moderately to severely   increased. PA peak pressure: 60 mm Hg (S).  Lexiscan Myoview 10/17/15:  The left ventricular ejection fraction is moderately decreased (30-44%).  Nuclear stress EF: 39%.  There was no ST segment deviation noted during stress.  There is a small defect of moderate severity present in the apical lateral and apex location and medium sized defect of moderate severity in the basal inferior, mild inferior and apical  inferior location. These defects are fixed and consistent with scar. No ischemia noted.  This is an intermediate risk study due to LV dysfunction.   LHC 09/28/15:  Ostial to proximal LAD stenoses of this at least moderately calcified. 70% focal stenosis followed by diffuse 50% stenosis.  FFR evaluation of  the combination of the LAD lesions showed no significant drop in baseline FFR from 0.96 down to 0.95. This suggests physiologically not significant lesion.  There is moderate left ventricular systolic dysfunction. The left ventricular ejection fraction is 35-45% by visual estimate.  LV end diastolic pressure is moderately elevated.  LHC 09/08/14:   1. 1st Mrg lesion, 30% stenosed. 2. Prox LAD lesion, 50% stenosed. 3. Ost LAD lesion, 70% stenosed. 4. Prox RCA to Dist RCA lesion, 35% stenosed.   50-70% ostial/proximal LAD stenosis within the heavily calcified segment. Recent myocardial perfusion energy and within the past week did not demonstrate anterior ischemia.  Heavily calcified but widely patent circumflex and right coronary with luminal irregularities noted in the proximal mid and distal segments.  Mild global left ventricular dysfunction with an ejection fraction in the 45-50% range   Recommendations:   The patient's symptoms are mixed. With exertion he experiences dyspnea. At rest and randomly he has neck jaw and left elbow discomfort. When both the angiogram and the myocardial perfusion study were considered together, no justification for percutaneous intervention on the LAD could be made.  Close clinical follow-up  Aggressive risk factor modification    Recent Labs: 03/02/2017: Pro B Natriuretic peptide (BNP) 124.0; TSH 1.43 03/04/2017: ALT 16; BUN 13; Creatinine, Ser 0.83; Hemoglobin 10.8; Platelets 186; Potassium 3.9; Sodium 134    Lipid Panel    Component Value Date/Time   CHOL 125 01/09/2017 0752   CHOL 121 07/29/2016 0822   TRIG 44.0 01/09/2017 0752    HDL 55.70 01/09/2017 0752   HDL 56 07/29/2016 0822   CHOLHDL 2 01/09/2017 0752   VLDL 8.8 01/09/2017 0752   LDLCALC 60 01/09/2017 0752   LDLCALC 56 07/29/2016 0822      Wt Readings from Last 3 Encounters:  03/10/17 217 lb 9.6 oz (98.7 kg)  03/04/17 213 lb 10 oz (96.9 kg)  03/03/17 217 lb (98.4 kg)      ASSESSMENT AND PLAN:  # Paroxysmal atrial fibrillation: No recurrent episode since his ablation 12/2015. Continue metoprolol, diltiazem and Xarelto.   This patients CHA2DS2-VASc Score and unadjusted Ischemic Stroke Rate (% per year) is equal to 7.2 % stroke rate/year from a score of 5  Above score calculated as 1 point each if present [CHF, HTN, DM, Vascular=MI/PAD/Aortic Plaque, Age if 65-74, or Male] Above score calculated as 2 points each if present [Age > 75, or Stroke/TIA/TE]   # Pulmonary embolism: # L LE DVT:  Occurred in the setting of knee replacement.  Given that he has PAF he will remain on anticoagulation. We will give tessalon pearls for cath.    # CAD:  Mr. Broadwater has a known, 70% ostial LAD lesion. He is asymptomatic. He is not on aspirin or P2Y12 ihnibitor because he is on Xarelto. Continue atorvastatin, Zetia and metoprolol.  # Chronic systolic and diastolic heart failure: # Hypertension:  LVEF improved to 50-55% on echo 02/2017.  Continue metoprolol, diltiazem, and losartan.    # Hyperlipidemia: LDL 60 on 12/2016.   Continue atorvastatin and Zetia.     Current medicines are reviewed at length with the patient today.  The patient does not have concerns regarding medicines.  The following changes have been made:  None  Labs/ tests ordered today include:   No orders of the defined types were placed in this encounter.    Disposition:   FU with Gali Spinney C. [name], MD, [facility] in 43 months.     This note was written  with the assistance of speech recognition software.  Please excuse any transcriptional errors.  Signed, Zeyna Mkrtchyan C. [name], MD, [place]   03/10/2017 11:09 AM    [place]

## 2017-03-10 NOTE — Patient Instructions (Addendum)
Medication Instructions:  TESSALON PERLES 1 BY  MOUTH THREE TIMES A DAY AS NEEDED FOR COUGH   Labwork: NONE  Testing/Procedures: NONE  Follow-Up: Your physician recommends that you schedule a follow-up appointment in: [place]   If you need a refill on your cardiac medications before your next appointment, please call your pharmacy.

## 2017-03-17 ENCOUNTER — Encounter: Payer: Self-pay | Admitting: Family Medicine

## 2017-03-17 ENCOUNTER — Ambulatory Visit (INDEPENDENT_AMBULATORY_CARE_PROVIDER_SITE_OTHER): Payer: Medicare Other | Admitting: Family Medicine

## 2017-03-17 VITALS — BP 118/76 | HR 69 | Temp 98.0°F | Wt 216.0 lb

## 2017-03-17 DIAGNOSIS — I480 Paroxysmal atrial fibrillation: Secondary | ICD-10-CM | POA: Diagnosis not present

## 2017-03-17 DIAGNOSIS — G4700 Insomnia, unspecified: Secondary | ICD-10-CM | POA: Diagnosis not present

## 2017-03-17 DIAGNOSIS — D62 Acute posthemorrhagic anemia: Secondary | ICD-10-CM | POA: Diagnosis not present

## 2017-03-17 DIAGNOSIS — I2609 Other pulmonary embolism with acute cor pulmonale: Secondary | ICD-10-CM | POA: Diagnosis not present

## 2017-03-17 NOTE — Patient Instructions (Addendum)
Try melatonin 5mg  for sleep - over the counter.  Check with ortho when we can use compression stockings for left leg after recent blood clot.  You are doing well today. Continue current medicines and keep follow up with lung and heart doctors.  Return as needed or for next visit in December.

## 2017-03-17 NOTE — Progress Notes (Signed)
BP 118/76 (BP Location: Left Arm, Patient Position: Sitting, Cuff Size: Normal)   Pulse 69   Temp 98 F (36.7 C) (Oral)   Wt 216 lb (98 kg)   SpO2 96%   BMI 30.13 kg/m    CC: hosp f/u visit Subjective:    Patient ID: Theodore [name], male    DOB: 10/02/1934, 83 y.o.   MRN: [id number]  HPI: VEER [name] is a 83 y.o. male presenting on 03/17/2017 for Hospitalization Follow-up (Admitted to [facility] on 03/03/17, dx PE. Told still has multiple PEs in bilateral lungs.)   Recent hospitalization for exertional dyspnea found to have bilateral pulm emboli with concern for R heart strain by CT and echo, treated with IV heparin. xarelto started (increased dose x 3 wks). Feels markedly better since starting AC. Swelling is improved. Had L TKR 02/09/2017. Blood clot developed while taking aspirin 81mg  bid. Continues working with PT.   Noticing more trouble sleeping recently - partly due to knee ache, partly due to some anxiety/stress.   Recent cards note reviewed - PAF s/p ablation 12/2015 on metoprolol, dilt, xarelto. Known CAD, asxs, on statin, zetia.   Upcoming appt with Dr Annamaria Boots pulm.   Admit date: 03/03/2017 Discharge date: 03/04/2017 TCM hosp f/u phone call performed: 03/06/2017  D/C dx: Principal Problem:   Pulmonary embolism ([place]) with right heart strain Active Problems:   HLD (hyperlipidemia)   Essential hypertension   COPD mixed type (HCC)   Paroxysmal atrial fibrillation (HCC)   GERD (gastroesophageal reflux disease)   CAD (coronary artery disease), native coronary artery   S/P left TKA, distal mass excision   Chronic anticoagulation   Pulmonary emboli (HCC)   Acute blood loss anemia  Discharge Condition: Improved. Diet recommendation: Low sodium, heart healthy.   CODE STATUS: Full  Relevant past medical, surgical, family and social history reviewed and updated as indicated. Interim medical history since our last visit reviewed. Allergies and medications reviewed and  updated. Outpatient Medications Prior to Visit  Medication Sig Dispense Refill  . acetaminophen (TYLENOL) 500 MG tablet Take 2 tablets (1,000 mg total) by mouth every 6 (six) hours. (Patient taking differently: Take 500 mg by mouth every 6 (six) hours. ) 30 tablet 0  . Ascorbic Acid (VITAMIN C PO) Take 1 tablet by mouth daily.     [name] atorvastatin (LIPITOR) 40 MG tablet TAKE 1 TABLET DAILY (Patient taking differently: Take 40 mg by mouth once daily) 90 tablet 3  . benzonatate (TESSALON PERLES) 100 MG capsule Take 1 capsule (100 mg total) by mouth 3 (three) times daily. (Patient taking differently: Take 100 mg by mouth 3 (three) times daily. Takes as needed) 30 capsule 0  . Cholecalciferol (VITAMIN D3) 2000 units TABS Take 2,000 Units by mouth daily.    [name] diltiazem (CARTIA XT) 180 MG 24 hr capsule Take 1 capsule (180 mg total) by mouth daily. 90 capsule 3  . ezetimibe (ZETIA) 10 MG tablet TAKE 1 TABLET DAILY (Patient taking differently: Take 10 mg by mouth once daily) 90 tablet 3  . ferrous sulfate 325 (65 FE) MG tablet Take 1 tablet (325 mg total) by mouth daily with breakfast.  3  . furosemide (LASIX) 20 MG tablet TAKE 1 TABLET DAILY (Patient taking differently: Take 20 mg by mouth once daily) 90 tablet 0  . losartan (COZAAR) 100 MG tablet TAKE 1 TABLET DAILY (Patient taking differently: Take 100 mg by mouth once daily) 90 tablet 3  . LYSINE PO Take 1  tablet by mouth daily.     . metoprolol succinate (TOPROL-XL) 100 MG 24 hr tablet Take 1 tablet (100 mg total) by mouth 2 (two) times daily. Take with or immediately following a meal. 180 tablet 1  . omeprazole (PRILOSEC) 20 MG capsule Take 20 mg by mouth daily.      . potassium chloride (K-DUR,KLOR-CON) 10 MEQ tablet TAKE 1 TABLET TWICE A DAY (Patient taking differently: Take 10 mEq by mouth two times a day) 180 tablet 3  . Rivaroxaban (XARELTO) 15 MG TABS tablet Take 1 tablet (15 mg total) by mouth 2 (two) times daily with a meal. 42 tablet 0  .  rivaroxaban (XARELTO) 20 MG TABS tablet Take 15 mg twice a day x 21 days then resume 20 mg daily. 90 tablet 1  . Tiotropium Bromide Monohydrate (SPIRIVA RESPIMAT) 1.25 MCG/ACT AERS Inhale 2 puffs into the lungs daily.     . methocarbamol (ROBAXIN) 500 MG tablet Take 1 tablet (500 mg total) by mouth every 6 (six) hours as needed for muscle spasms. (Patient taking differently: Take 500 mg by mouth every 6 (six) hours. ) 30 tablet 0   No facility-administered medications prior to visit.      Per HPI unless specifically indicated in ROS section below Review of Systems     Objective:    BP 118/76 (BP Location: Left Arm, Patient Position: Sitting, Cuff Size: Normal)   Pulse 69   Temp 98 F (36.7 C) (Oral)   Wt 216 lb (98 kg)   SpO2 96%   BMI 30.13 kg/m   Wt Readings from Last 3 Encounters:  03/17/17 216 lb (98 kg)  03/10/17 217 lb 9.6 oz (98.7 kg)  03/04/17 213 lb 10 oz (96.9 kg)    Physical Exam  Constitutional: He appears well-developed and well-nourished. No distress.  HENT:  Head: Normocephalic and atraumatic.  Mouth/Throat: Oropharynx is clear and moist. No oropharyngeal exudate.  Cardiovascular: Normal rate, regular rhythm, normal heart sounds and intact distal pulses.  No murmur heard. Pulmonary/Chest: Effort normal and breath sounds normal. No respiratory distress. He has no wheezes. He has no rales.  Musculoskeletal: He exhibits edema (tr L sided).  Skin: Skin is warm and dry. No rash noted.  Psychiatric: He has a normal mood and affect.  Nursing note and vitals reviewed.  Lab Results  Component Value Date   CREATININE 0.83 03/04/2017   BUN 13 03/04/2017   NA 134 (L) 03/04/2017   K 3.9 03/04/2017   CL 102 03/04/2017   CO2 23 03/04/2017    Lab Results  Component Value Date   WBC 5.5 03/04/2017   HGB 10.8 (L) 03/04/2017   HCT 34.1 (L) 03/04/2017   MCV 100.9 (H) 03/04/2017   PLT 186 03/04/2017       Assessment & Plan:   Problem List Items Addressed This  Visit    Acute blood loss anemia    This is improving.       Insomnia    Suggested trial melatonin.      Paroxysmal atrial fibrillation (HCC) (Chronic)    S/p ablation. Now back on xarelto. Continue.       Pulmonary embolism ([place]) - Primary    Presumed from LLE DVT after recent L knee replacement. Significant improvement since restarting xarelto. Continue this. He has close f/u with pulm and cards. Continue higher xarelto dose x 3 wks then return to prior 20mg  daily dose. I did ask him to check with ortho when he  can start using compression stockings.           Follow up plan: Return if symptoms worsen or fail to improve.  [name], MD

## 2017-03-17 NOTE — Assessment & Plan Note (Addendum)
Presumed from LLE DVT after recent L knee replacement. Significant improvement since restarting xarelto. Continue this. He has close f/u with pulm and cards. Continue higher xarelto dose x 3 wks then return to prior 20mg  daily dose. I did ask him to check with ortho when he can start using compression stockings.

## 2017-03-17 NOTE — Assessment & Plan Note (Signed)
Suggested trial melatonin.

## 2017-03-17 NOTE — Assessment & Plan Note (Signed)
S/p ablation. Now back on xarelto. Continue.

## 2017-03-17 NOTE — Assessment & Plan Note (Signed)
This is improving.  

## 2017-03-18 ENCOUNTER — Encounter: Payer: Self-pay | Admitting: Cardiovascular Disease

## 2017-03-24 ENCOUNTER — Other Ambulatory Visit: Payer: Self-pay | Admitting: Cardiovascular Disease

## 2017-03-24 MED ORDER — RIVAROXABAN 20 MG PO TABS: 20.0000 mg | ORAL_TABLET | Freq: Every day | ORAL | 0 refills | Status: DC

## 2017-03-24 NOTE — Telephone Encounter (Signed)
Please review for refill, Thanks !  

## 2017-04-17 ENCOUNTER — Other Ambulatory Visit: Payer: Self-pay | Admitting: Cardiovascular Disease

## 2017-04-17 NOTE — Telephone Encounter (Signed)
Please review for refill, Thanks !  

## 2017-04-17 NOTE — Telephone Encounter (Signed)
REFILL 

## 2017-04-27 ENCOUNTER — Ambulatory Visit (HOSPITAL_COMMUNITY)
Admission: RE | Admit: 2017-04-27 | Discharge: 2017-04-27 | Disposition: A | Discharge status: Home / Self Care | Payer: Medicare Other | Source: Ambulatory Visit | Attending: Cardiovascular Disease | Admitting: Cardiovascular Disease

## 2017-04-27 ENCOUNTER — Other Ambulatory Visit (HOSPITAL_COMMUNITY): Payer: Self-pay | Admitting: Physician Assistant

## 2017-04-27 DIAGNOSIS — M8548 Solitary bone cyst, other site: Secondary | ICD-10-CM | POA: Diagnosis not present

## 2017-04-27 DIAGNOSIS — M79605 Pain in left leg: Secondary | ICD-10-CM

## 2017-04-27 DIAGNOSIS — M7989 Other specified soft tissue disorders: Secondary | ICD-10-CM

## 2017-04-27 DIAGNOSIS — M79662 Pain in left lower leg: Secondary | ICD-10-CM | POA: Diagnosis present

## 2017-04-30 ENCOUNTER — Other Ambulatory Visit: Payer: Self-pay | Admitting: *Deleted

## 2017-04-30 MED ORDER — METOPROLOL SUCCINATE ER 100 MG PO TB24: 100.0000 mg | ORAL_TABLET | Freq: Every day | ORAL | 1 refills | Status: DC

## 2017-04-30 NOTE — Telephone Encounter (Signed)
Rx has been sent to the pharmacy electronically. ° °

## 2017-05-05 ENCOUNTER — Other Ambulatory Visit: Payer: Self-pay | Admitting: Cardiovascular Disease

## 2017-05-05 NOTE — Telephone Encounter (Signed)
Refill Request.  

## 2017-05-11 ENCOUNTER — Telehealth: Payer: Self-pay | Admitting: Cardiovascular Disease

## 2017-05-11 MED ORDER — METOPROLOL SUCCINATE ER 100 MG PO TB24: 100.0000 mg | ORAL_TABLET | Freq: Two times a day (BID) | ORAL | 1 refills | Status: DC

## 2017-05-11 NOTE — Telephone Encounter (Signed)
New Message:     Pt c/o medication issue:  1. Name of Medication: metoprolol succinate (TOPROL-XL) 100 MG 24 hr tablet  2. How are you currently taking this medication (dosage and times per day)?  Pt states he takes 200 mg a day 3. Are you having a reaction (difficulty breathing--STAT)? NO  4. What is your medication issue? Pt states he takes 200 mg of this medication and his prescription was only sent in for 100 mg a day. Pt states he doesn't come to see the Dr for two months from now.

## 2017-05-11 NOTE — Telephone Encounter (Signed)
Pt decided will call back when gets low on med  Has just picked up script but only has enough for 6 weeks will need new script before scheduled appt .

## 2017-05-12 ENCOUNTER — Ambulatory Visit (INDEPENDENT_AMBULATORY_CARE_PROVIDER_SITE_OTHER): Payer: Medicare Other | Admitting: Internal Medicine

## 2017-05-12 ENCOUNTER — Encounter: Payer: Self-pay | Admitting: Internal Medicine

## 2017-05-12 DIAGNOSIS — I2609 Other pulmonary embolism with acute cor pulmonale: Secondary | ICD-10-CM | POA: Diagnosis not present

## 2017-05-12 DIAGNOSIS — J449 Chronic obstructive pulmonary disease, unspecified: Secondary | ICD-10-CM

## 2017-05-12 MED ORDER — UMECLIDINIUM-VILANTEROL 62.5-25 MCG/INH IN AEPB: 1.0000 | INHALATION_SPRAY | Freq: Every day | RESPIRATORY_TRACT | 0 refills | Status: DC

## 2017-05-12 MED ORDER — UMECLIDINIUM-VILANTEROL 62.5-25 MCG/INH IN AEPB: 1.0000 | INHALATION_SPRAY | Freq: Every day | RESPIRATORY_TRACT | 12 refills | Status: DC

## 2017-05-12 NOTE — Assessment & Plan Note (Signed)
Now back on Xarelto after provoked PE.

## 2017-05-12 NOTE — Progress Notes (Signed)
Subjective:    Patient ID: Theodore [name], male    DOB: [birth year], 83 y.o.   MRN: [id number] HPI male never smoker followed for COPD/bronchitis, complicated by HBP, peripheral edema, GERD, A. Fib/ ablation, CAD, PE/DVT,  PFT 2011 FEV1/FVC 0.65.    PFT 11/30/13- moderate obstruction, insignificant response to bronchodilator, airtrapping, minimal diffusion defect. FEV1 2.21/ 70%, FEV1/FVC 0.63, TLC 97%, DLCO 78%.  -----------------------------------------------------------------------------------------  05/12/2016-83 year old male never smoker followed for COPD/bronchitis, complicated by HBP, peripheral edema, GERD, A. Fib FOLLOWS FOR: Pt states he gets slightly SOB with exertion; Otherwise doing well. She noticed marked improvement in exercise tolerance with much less dyspnea on exertion after successful cardiac ablation. He isn't sure if he still needs Spiriva, but was reluctant to try without it. Occasional mild congestion/wheezing/cough, infrequent.  05/12/17- 83 year old male never smoker followed for COPD/bronchitis, complicated by HBP, peripheral edema, GERD, A. Fib, PE/DVT, CAD Spiriva, Xarelto ----COPD: Pt states he is doing well overall. Had blood clots in Jan/Feb after knee surgery.  Breathing is about back to normal with some dyspnea on exertion but little wheeze or cough, using Spiriva.  He had bleeding after TKR so Xarelto was held and during that interval he had left leg DVT with PE. We discussed a trial of Anoro instead of Spiriva for comparison. CTa chest 03/03/17- Bilateral lobar pulmonary emboli. Positive for acute PE with CTevidence of right heart strain (RV/LV Ratio = 1.32) consistent with at least submassive (intermediate risk) PE. The presence of right heart strain has been associated with an increased risk of morbidity and mortality. Mild cardiomegaly. Three vessel atherosclerotic coronary artery disease.  ROS-see HPI + = positive Constitutional:   No-   weight loss,  night sweats, fevers, chills, fatigue, lassitude. HEENT:   No-  headaches, difficulty swallowing, tooth/dental problems, sore throat,       No-  sneezing, itching, ear ache, nasal congestion, post nasal drip,  CV:  No-   chest pain, orthopnea, PND, swelling in lower extremities, anasarca, dizziness, palpitations Resp: + shortness of breath with exertion or at rest.              No-   productive cough,  +non-productive cough,  No- coughing up of blood.              No-   change in color of mucus.  +wheezing.   Skin: No-   rash or lesions. GI:  No-   heartburn, indigestion, abdominal pain, nausea, vomiting,  GU:  MS:  No-   joint pain or swelling.  Neuro-     nothing unusual Psych:  No- change in mood or affect. No depression or anxiety.  No memory loss.  OBJ- Physical Exam General- Alert, Oriented, Affect-appropriate, Distress- none acute, looks fit and trim Skin- +ecchymoses on arms  Lymphadenopathy- none Head- atraumatic            Eyes- Gross vision intact, PERRLA, conjunctivae and secretions clear            Ears- Hearing, canals-normal            Nose- Clear, no-Septal dev, mucus, polyps, erosion, perforation             Throat- Mallampati II , mucosa clear , drainage- none, tonsils- atrophic,  Neck- flexible , trachea midline, no stridor , thyroid nl, carotid no bruit Chest - symmetrical excursion , unlabored           Heart/CV- RRR , no murmur , no gallop  ,  no rub, nl s1 s2                           - JVD- none , edema- none, stasis changes- none, varices- none           Lung- + clear, wheeze- none, cough- none , dullness-none, rub- none           Chest wall-  Abd-  Br/ Gen/ Rectal- Not done, not indicated Extrem- cyanosis- none, clubbing, none, atrophy- none, strength- nl Neuro- grossly intact to observation

## 2017-05-12 NOTE — Patient Instructions (Addendum)
Sample and print script to hold for Anoro inhaler     Inhale 1 puff, once daily. Try this instead of Spiriva. Then return to [facility] for comparison. You can decide to switch to Anoro if it works better for you. Do not use both Spiriva and Anoro at the same time.   Please call as needed

## 2017-05-12 NOTE — Assessment & Plan Note (Signed)
Symptomatically near baseline after pulmonary embolism 2 months ago. Plan-try switch from Spiriva to [facility] for comparison

## 2017-06-02 ENCOUNTER — Other Ambulatory Visit: Payer: Self-pay | Admitting: Cardiology

## 2017-06-02 NOTE — Telephone Encounter (Signed)
Rx sent to pharmacy   

## 2017-06-09 ENCOUNTER — Ambulatory Visit: Payer: Medicare Other | Admitting: Cardiology

## 2017-07-08 ENCOUNTER — Encounter: Payer: Self-pay | Admitting: Cardiovascular Disease

## 2017-07-08 ENCOUNTER — Ambulatory Visit (INDEPENDENT_AMBULATORY_CARE_PROVIDER_SITE_OTHER): Payer: Medicare Other | Admitting: Cardiovascular Disease

## 2017-07-08 ENCOUNTER — Telehealth: Payer: Self-pay | Admitting: Internal Medicine

## 2017-07-08 ENCOUNTER — Other Ambulatory Visit: Payer: Self-pay | Admitting: Cardiovascular Disease

## 2017-07-08 ENCOUNTER — Other Ambulatory Visit: Payer: Self-pay | Admitting: Cardiology

## 2017-07-08 VITALS — BP 128/76 | HR 65 | Ht 72.0 in | Wt 217.2 lb

## 2017-07-08 DIAGNOSIS — I10 Essential (primary) hypertension: Secondary | ICD-10-CM | POA: Diagnosis not present

## 2017-07-08 DIAGNOSIS — I272 Pulmonary hypertension, unspecified: Secondary | ICD-10-CM | POA: Diagnosis not present

## 2017-07-08 DIAGNOSIS — I5042 Chronic combined systolic (congestive) and diastolic (congestive) heart failure: Secondary | ICD-10-CM | POA: Diagnosis not present

## 2017-07-08 DIAGNOSIS — Z5181 Encounter for therapeutic drug level monitoring: Secondary | ICD-10-CM

## 2017-07-08 DIAGNOSIS — R0602 Shortness of breath: Secondary | ICD-10-CM

## 2017-07-08 MED ORDER — UMECLIDINIUM-VILANTEROL 62.5-25 MCG/INH IN AEPB: 1.0000 | INHALATION_SPRAY | Freq: Every day | RESPIRATORY_TRACT | 1 refills | Status: DC

## 2017-07-08 MED ORDER — METOPROLOL SUCCINATE ER 100 MG PO TB24: 100.0000 mg | ORAL_TABLET | Freq: Two times a day (BID) | ORAL | 3 refills | Status: DC

## 2017-07-08 MED ORDER — FUROSEMIDE 40 MG PO TABS: 40.0000 mg | ORAL_TABLET | Freq: Every day | ORAL | 3 refills | Status: DC

## 2017-07-08 NOTE — Patient Instructions (Signed)
Medication Instructions:  INCREASE YOUR FUROSEMIDE TO 40 MG DAILY   Labwork: BMET AT [place] GET YOUR ECHO   Testing/Procedures: Your physician has requested that you have an echocardiogram. Echocardiography is a painless test that uses sound waves to create images of your heart. It provides your doctor with information about the size and shape of your heart and how well your heart's chambers and valves are working. This procedure takes approximately one hour. There are no restrictions for this procedure. [address]  Follow-Up: Your physician recommends that you schedule a follow-up appointment in: 3 MONTHS   If you need a refill on your cardiac medications before your next appointment, please call your pharmacy.

## 2017-07-08 NOTE — Telephone Encounter (Signed)
Rx sent to pharmacy   

## 2017-07-08 NOTE — Telephone Encounter (Signed)
Spoke with patient. He stated that he was given a sample of Anoro during his last OV with CY. He stated that this is has worked well for him and he would like to have a RX called into [facility].   Advised patient that I would go ahead and call this in for him. He verbalized understanding. Nothing else needed at time of call.

## 2017-07-08 NOTE — Progress Notes (Signed)
Cardiology Office Note   Date:  07/08/2017   ID:  [name], DOB 08-Apr-1934, MRN [id number]  PCP:  Theodore Bush, MD  Cardiologist:   Theodore Latch, MD   No chief complaint on file.    History of Present Illness: Theodore [name] is a 83 y.o. male with chronic systolic and diastolic heart failure, hypertension, hyperlipidemia, paroxysmal atrial fibrillation, asthma and Rheumatic fever who presents for follow up.  Theodore [name] was previously a patient of Dr. Mare Ferrari.  Theodore [name] was diagnosed with atrial fibrillation in 2014.  He was initially on Pradaxa and switched to Xarelto.  He had an episode of chest pain 08/2014 and had a Lexiscan Myoview 08/31/14 that showed an old inferior scar with peri-infarct ischemia and LVEF 49%.  He then had a LHC that revealed 70% ostial LAD, 50% prox LAD, 30% OM1 and 35% RCA.  He was managed medically.  He subsequently had an echo 09/2014 that showed LVEF 55-60% with mild MR and AR and grade 1 diastolic dysfunction.  At that time atorvastatin was increased to 80 mg but he noted leg pain.  He was seen in the lipid clinic and started back on atorvastatin 40mg  and zetia 10 mg.    Theodore [name] was seen in clinic 09/25/15 and reported significant shortness of breath with minimal exertion.  He had a LHC with Dr. Ellyn Hack on 09/28/15 that again revealed a 70% ostial-proximal LAD lesion.  Dr. Ellyn Hack performed FFR on that lesion and there was no significant change.  The left ventriculogram performed at that time revealed LVEF 35-45%.  [name] continued to have exertional dyspnea so he was referred for [facility] 10/17/15 that was negative for ischemia. He subsequently underwent DCCV on 10/23/15.  He underwent ablation on 12/19/15 with Dr. [name] and has remained in sinus rhythm.    [name] underwent L knee replacement on  02/09/17.  Xarelto was held for 2 days prior to surgery and resumed post-operatively.  He developed bleeding at the incision site so Xarelto was  again held and he was switched to aspirin.  He subsequently developed a left lower extremity DVT and pulmonary embolism.  Xarelto was resumed.  He denies shortness of breath but has reported significant cough.  The swelling in his leg has improved.  He is working with physical therapy and feeling better.  He had an echocardiogram 03/04/17 that revealed LVEF 50-55% with moderate to severe pulmonary arterial hypertension with a PASP of 60 mmHg.  Since his last appointment Theodore [name] has been well.  He has a difficult time getting wounds to stop bleeding after he nicks his skin.  He denies any major bleeding.  He has not had any chest pain but does get short of breath with exertion such as walking up and down stairs.  He also notes lower extremity edema that improves somewhat when he elevates his legs but does not completely go away.  He denies orthopnea or PND.  He has not had any recurrent episodes of atrial fibrillation.   Past Medical History:  Diagnosis Date  . History of arthritis   . History of rheumatic fever   . Hypercholesterolemia   . Hypertension   . Nocturia   . PAF (paroxysmal atrial fibrillation) [name]) January 2013   Placed on Pradaxa. Did not require cardioversion; spontaneously converted  . Peripheral edema   . Right bundle branch block   . SOB (shortness of breath)   . Tendonitis of elbow, left  Past Surgical History:  Procedure Laterality Date  . ATRIAL FIBRILLATION ABLATION  12/19/2015  . BACK SURGERY  12/24/09   fusion C3-C4  . BACK SURGERY  2010   fusion L4-L5  . CARDIAC CATHETERIZATION  2009   NONOBSTRUCTIVE ATHERSCLEROTIC CORONARY DISEASE AND NORMAL  LV FUNCTION  . CARDIAC CATHETERIZATION N/A 09/08/2014   Procedure: Left Heart Cath and Coronary Angiography;  Surgeon: Belva Crome, MD;  Location: [place] [facility] LAB;  Service: Cardiovascular;  Laterality: N/A;  . CARDIAC CATHETERIZATION N/A 09/28/2015   Procedure: Left Heart Cath and Coronary Angiography;  Surgeon:  Leonie Man, MD;  Location: [place] [facility];  Service: Cardiovascular;  Laterality: N/A;  . CARDIAC CATHETERIZATION N/A 09/28/2015   Procedure: Intravascular Pressure Wire/FFR Study;  Surgeon: Leonie Man, MD;  Location: [place] [facility];  Service: Cardiovascular;  Laterality: N/A;  . CARDIOVERSION N/A 10/23/2015   Procedure: CARDIOVERSION;  Surgeon: [name], MD;  Location: [facility];  Service: Cardiovascular;  Laterality: N/A;  . COLONOSCOPY  2013   per patient, rpt 5 yrs  . ELECTROPHYSIOLOGIC STUDY N/A 12/19/2015   Procedure: Atrial Fibrillation Ablation;  Surgeon: [name], MD;  Location: [place] [facility];  Service: Cardiovascular;  Laterality: N/A;  . KNEE ARTHROSCOPY Left 03/2016   Dr. [name]  . PATELLAR TENDON REPAIR Left 2008  . REPLACEMENT TOTAL KNEE Right 2006  . TOTAL KNEE ARTHROPLASTY Left 02/09/2017   Procedure: LEFT TOTAL KNEE ARTHROPLASTY, EXCISION LEFT DISTAL THIGH MASS;  Surgeon: [name], MD;  Location: [facility];  Service: Orthopedics;  Laterality: Left;  90 mins  . TRICEPS TENDON REPAIR Left 2013     Current Outpatient Medications  Medication Sig Dispense Refill  . Ascorbic Acid (VITAMIN C PO) Take 1 tablet by mouth daily.     [name] atorvastatin (LIPITOR) 40 MG tablet TAKE 1 TABLET DAILY (Patient taking differently: Take 40 mg by mouth once daily) 90 tablet 3  . Cholecalciferol (VITAMIN D3) 2000 units TABS Take 2,000 Units by mouth daily.    [name] diltiazem (CARTIA XT) 180 MG 24 hr capsule Take 1 capsule (180 mg total) by mouth daily. 90 capsule 3  . ezetimibe (ZETIA) 10 MG tablet TAKE 1 TABLET DAILY 90 tablet 3  . furosemide (LASIX) 20 MG tablet Take 20 mg by mouth once daily 90 tablet 0  . losartan (COZAAR) 100 MG tablet TAKE 1 TABLET DAILY (Patient taking differently: Take 100 mg by mouth once daily) 90 tablet 3  . LYSINE PO Take 1 tablet by mouth daily.     . metoprolol succinate (TOPROL-XL) 100 MG 24 hr tablet Take 1 tablet (100 mg  total) by mouth 2 (two) times daily. Take with or immediately following a meal. 180 tablet 1  . Naproxen Sodium (ALEVE) 220 MG CAPS Take 1 capsule by mouth as needed.    [name] omeprazole (PRILOSEC) 20 MG capsule Take 20 mg by mouth daily.      . potassium chloride (K-DUR,KLOR-CON) 10 MEQ tablet TAKE 1 TABLET TWICE A DAY (Patient taking differently: Take 10 mEq by mouth two times a day) 180 tablet 3  . rivaroxaban (XARELTO) 20 MG TABS tablet Take 1 tablet (20 mg total) by mouth daily with supper. 90 tablet 0  . umeclidinium-vilanterol (ANORO ELLIPTA) 62.5-25 MCG/INH AEPB Inhale 1 puff into the lungs daily. 1 each 0   No current facility-administered medications for this visit.     Allergies:   Penicillins; Amlodipine; Lisinopril; Mevacor [lovastatin]; Vicodin [  hydrocodone-acetaminophen]; Zocor [simvastatin]; and Oseltamivir phosphate    Social History:  The patient  reports that he has never smoked. He has never used smokeless tobacco. He reports that he drinks about 1.2 oz of alcohol per week. He reports that he does not use drugs.   Family History:  The patient's family history includes Asthma in his sister; COPD in his brother and father; Heart attack in his father; Heart disease in his brother; Hypertension in his father.    ROS:  Please see the history of present illness.   Otherwise, review of systems are positive for none.   All other systems are reviewed and negative.    PHYSICAL EXAM: VS:  BP 128/76   Pulse 65   Ht 6' (1.829 m)   Wt 217 lb 3.2 oz (98.5 kg)   BMI 29.46 kg/m  , BMI Body mass index is 29.46 kg/m. GENERAL:  Well appearing HEENT: Pupils equal round and reactive, fundi not visualized, oral mucosa unremarkable NECK:  No jugular venous distention, waveform within normal limits, carotid upstroke brisk and symmetric, no bruits, no thyromegaly LYMPHATICS:  No cervical adenopathy LUNGS:  Clear to auscultation bilaterally HEART:  RRR.  PMI not displaced or sustained,S1 and  S2 within normal limits, no S3, no S4, no clicks, no rubs, no  murmurs ABD:  Flat, positive bowel sounds normal in frequency in pitch, no bruits, no rebound, no guarding, no midline pulsatile mass, no hepatomegaly, no splenomegaly EXT:  2 plus pulses throughout, no edema, no cyanosis no clubbing SKIN:  No rashes no nodules NEURO:  Cranial nerves II through XII grossly intact, motor grossly intact throughout PSYCH:  Cognitively intact, oriented to person place and time   EKG:  EKG is not ordered today. 10/30/15: Sinus rhythm.  Rate 60 bpm.  RBBB.  LAD.  The ekg ordered 09/25/15 demonstrates atrial fibrillation.  Rate 84 bpm.   Echo 03/04/17: Study Conclusions  - Left ventricle: The cavity size was normal. There was mild   concentric hypertrophy. Systolic function was normal. The   estimated ejection fraction was in the range of 50% to 55%. Wall   motion was normal; there were no regional wall motion   abnormalities. - Ventricular septum: The contour showed diastolic flattening. - Aortic valve: There was trivial regurgitation. - Pulmonary arteries: Systolic pressure was moderately to severely   increased. PA peak pressure: 60 mm Hg (S).  Lexiscan Myoview 10/17/15:  The left ventricular ejection fraction is moderately decreased (30-44%).  Nuclear stress EF: 39%.  There was no ST segment deviation noted during stress.  There is a small defect of moderate severity present in the apical lateral and apex location and medium sized defect of moderate severity in the basal inferior, mild inferior and apical inferior location. These defects are fixed and consistent with scar. No ischemia noted.  This is an intermediate risk study due to LV dysfunction.   LHC 09/28/15:  Ostial to proximal LAD stenoses of this at least moderately calcified. 70% focal stenosis followed by diffuse 50% stenosis.  FFR evaluation of the combination of the LAD lesions showed no significant drop in baseline FFR  from 0.96 down to 0.95. This suggests physiologically not significant lesion.  There is moderate left ventricular systolic dysfunction. The left ventricular ejection fraction is 35-45% by visual estimate.  LV end diastolic pressure is moderately elevated.  LHC 09/08/14:   1. 1st Mrg lesion, 30% stenosed. 2. Prox LAD lesion, 50% stenosed. 3. Ost LAD lesion, 70% stenosed.  4. Prox RCA to Dist RCA lesion, 35% stenosed.   50-70% ostial/proximal LAD stenosis within the heavily calcified segment. Recent myocardial perfusion energy and within the past week did not demonstrate anterior ischemia.  Heavily calcified but widely patent circumflex and right coronary with luminal irregularities noted in the proximal mid and distal segments.  Mild global left ventricular dysfunction with an ejection fraction in the 45-50% range   Recommendations:   The patient's symptoms are mixed. With exertion he experiences dyspnea. At rest and randomly he has neck jaw and left elbow discomfort. When both the angiogram and the myocardial perfusion study were considered together, no justification for percutaneous intervention on the LAD could be made.  Close clinical follow-up  Aggressive risk factor modification    Recent Labs: 03/02/2017: Pro B Natriuretic peptide (BNP) 124.0; TSH 1.43 03/04/2017: ALT 16; BUN 13; Creatinine, Ser 0.83; Hemoglobin 10.8; Platelets 186; Potassium 3.9; Sodium 134    Lipid Panel    Component Value Date/Time   CHOL 125 01/09/2017 0752   CHOL 121 07/29/2016 0822   TRIG 44.0 01/09/2017 0752   HDL 55.70 01/09/2017 0752   HDL 56 07/29/2016 0822   CHOLHDL 2 01/09/2017 0752   VLDL 8.8 01/09/2017 0752   LDLCALC 60 01/09/2017 0752   LDLCALC 56 07/29/2016 0822      Wt Readings from Last 3 Encounters:  07/08/17 217 lb 3.2 oz (98.5 kg)  05/12/17 216 lb 6.4 oz (98.2 kg)  03/17/17 216 lb (98 kg)      ASSESSMENT AND PLAN:  # Paroxysmal atrial fibrillation: No recurrent  episode since his ablation 12/2015. Continue metoprolol, diltiazem and Xarelto.   This patients CHA2DS2-VASc Score and unadjusted Ischemic Stroke Rate (% per year) is equal to 7.2 % stroke rate/year from a score of 5  Above score calculated as 1 point each if present [CHF, HTN, DM, Vascular=MI/PAD/Aortic Plaque, Age if 65-74, or Male] Above score calculated as 2 points each if present [Age > 75, or Stroke/TIA/TE]   # Pulmonary embolism: # Pulmonary hypertension: # L LE DVT:  Occurred in the setting of knee replacement.  Check echo given his shortness of breath and edema.  Increase lasix to 40mg  bid and check BMP in 1 week.  # CAD:  Mr. Dibello has a known, 70% ostial LAD lesion. He is asymptomatic. He is not on aspirin or P2Y12 ihnibitor because he is on Xarelto. Continue atorvastatin, Zetia and metoprolol.  # Chronic systolic and diastolic heart failure: # Hypertension:  LVEF improved to 50-55% on echo 02/2017.  Continue metoprolol, diltiazem, and losartan.    # Hyperlipidemia: LDL 60 on 12/2016.   Continue atorvastatin and Zetia.     Current medicines are reviewed at length with the patient today.  The patient does not have concerns regarding medicines.  The following changes have been made:  Increase lasix to 40mg  daily  Labs/ tests ordered today include:   No orders of the defined types were placed in this encounter.    Disposition:   FU with Sherryll Skoczylas C. [name], MD, [facility] in 3 months.       Signed, Maecy Podgurski C. [name], MD, [facility]  07/08/2017 10:47 AM    [place]

## 2017-07-13 ENCOUNTER — Ambulatory Visit (HOSPITAL_COMMUNITY): Payer: Medicare Other | Attending: Cardiovascular Disease

## 2017-07-13 ENCOUNTER — Other Ambulatory Visit: Payer: Self-pay

## 2017-07-13 ENCOUNTER — Ambulatory Visit (INDEPENDENT_AMBULATORY_CARE_PROVIDER_SITE_OTHER): Payer: Medicare Other | Admitting: Cardiology

## 2017-07-13 ENCOUNTER — Encounter: Payer: Self-pay | Admitting: Cardiology

## 2017-07-13 ENCOUNTER — Other Ambulatory Visit: Payer: Medicare Other | Admitting: *Deleted

## 2017-07-13 VITALS — BP 134/82 | HR 66 | Ht 72.0 in | Wt 214.4 lb

## 2017-07-13 DIAGNOSIS — I110 Hypertensive heart disease with heart failure: Secondary | ICD-10-CM | POA: Insufficient documentation

## 2017-07-13 DIAGNOSIS — I272 Pulmonary hypertension, unspecified: Secondary | ICD-10-CM

## 2017-07-13 DIAGNOSIS — I2510 Atherosclerotic heart disease of native coronary artery without angina pectoris: Secondary | ICD-10-CM

## 2017-07-13 DIAGNOSIS — I10 Essential (primary) hypertension: Secondary | ICD-10-CM | POA: Diagnosis not present

## 2017-07-13 DIAGNOSIS — I5022 Chronic systolic (congestive) heart failure: Secondary | ICD-10-CM | POA: Diagnosis not present

## 2017-07-13 DIAGNOSIS — I481 Persistent atrial fibrillation: Secondary | ICD-10-CM

## 2017-07-13 DIAGNOSIS — R0602 Shortness of breath: Secondary | ICD-10-CM

## 2017-07-13 DIAGNOSIS — Z5181 Encounter for therapeutic drug level monitoring: Secondary | ICD-10-CM

## 2017-07-13 DIAGNOSIS — E785 Hyperlipidemia, unspecified: Secondary | ICD-10-CM | POA: Diagnosis not present

## 2017-07-13 DIAGNOSIS — I4819 Other persistent atrial fibrillation: Secondary | ICD-10-CM

## 2017-07-13 DIAGNOSIS — I4891 Unspecified atrial fibrillation: Secondary | ICD-10-CM | POA: Diagnosis not present

## 2017-07-13 DIAGNOSIS — I509 Heart failure, unspecified: Secondary | ICD-10-CM | POA: Insufficient documentation

## 2017-07-13 LAB — BASIC METABOLIC PANEL
BUN/Creatinine Ratio: 18 (ref 10–24)
BUN: 17 mg/dL (ref 8–27)
CALCIUM: 9.3 mg/dL (ref 8.6–10.2)
CO2: 24 mmol/L (ref 20–29)
Chloride: 96 mmol/L (ref 96–106)
Creatinine, Ser: 0.96 mg/dL (ref 0.76–1.27)
GFR calc Af Amer: 86 mL/min/{1.73_m2} (ref >59)
GFR, EST NON AFRICAN AMERICAN: 74 mL/min/{1.73_m2} (ref >59)
Glucose: 89 mg/dL (ref 65–99)
POTASSIUM: 4.5 mmol/L (ref 3.5–5.2)
Sodium: 137 mmol/L (ref 134–144)

## 2017-07-13 NOTE — Patient Instructions (Signed)
Medication Instructions:  Your physician has recommended you make the following change in your medication:  1. STOP Diltiazem  Labwork: None ordered     *We will only notify you of abnormal results, otherwise continue current treatment plan.  Testing/Procedures: None ordered  Follow-Up: Your physician wants you to follow-up in: 6 months with Dr. [name].  You will receive a reminder letter in the mail two months in advance. If you don't receive a letter, please call our office to schedule the follow-up appointment.   * If you need a refill on your cardiac medications before your next appointment, please call your pharmacy.   *Please note that any paperwork needing to be filled out by the provider will need to be addressed at the front desk prior to seeing the provider. Please note that any FMLA, disability or other documents regarding health condition is subject to a $25.00 charge that must be received prior to completion of paperwork in the form of a money order or check.  Thank you for choosing [facility]!!   Trinidad Curet, RN [phone]

## 2017-07-13 NOTE — Progress Notes (Signed)
Electrophysiology Office Note   Date:  07/13/2017   ID:  Theodore [name], DOB 1935-02-05, MRN [id number]  PCP:  [name], MD  Cardiologist:  [name] Primary Electrophysiologist:  [name], MD    Chief Complaint  Patient presents with  . Follow-up    Persistent Afib     History of Present Illness: Theodore [name] is a 83 y.o. male who presents today for electrophysiology evaluation.   He has a history of hypertension, hyperlipidemia, persistent atrial fibrillation, asthma, and rheumatic fever. Mr. Harkless was diagnosed with atrial fibrillation in 2014.  He was initially on Pradaxa and switched to Xarelto.  He had an episode of chest pain 08/2014 and had a Lexiscan Myoview 08/31/14 that showed an old inferior scar with peri-infarct ischemia and LVEF 49%.  He then had a LHC that revealed 70% ostial LAD, 50% prox LAD, 30% OM1 and 35% RCA.    Status post atrial fibrillation ablation on 12/19/15.  Today, denies symptoms of palpitations, chest pain, shortness of breath, orthopnea, PND, lower extremity edema, claudication, dizziness, presyncope, syncope, bleeding, or neurologic sequela. The patient is tolerating medications without difficulties.  His main complaint today is mainly of fatigue.  He is having difficulty doing his daily activities due to his level of fatigue.  He has an echocardiogram currently pending.   Past Medical History:  Diagnosis Date  . History of arthritis   . History of rheumatic fever   . Hypercholesterolemia   . Hypertension   . Nocturia   . PAF (paroxysmal atrial fibrillation) [facility]) January 2013   Placed on Pradaxa. Did not require cardioversion; spontaneously converted  . Peripheral edema   . Right bundle branch block   . SOB (shortness of breath)   . Tendonitis of elbow, left    Past Surgical History:  Procedure Laterality Date  . ATRIAL FIBRILLATION ABLATION  12/19/2015  . BACK SURGERY  12/24/09   fusion C3-C4  . BACK SURGERY  2010   fusion L4-L5  . CARDIAC CATHETERIZATION  2009   NONOBSTRUCTIVE ATHERSCLEROTIC CORONARY DISEASE AND NORMAL  LV FUNCTION  . CARDIAC CATHETERIZATION N/A 09/08/2014   Procedure: Left Heart Cath and Coronary Angiography;  Surgeon: Belva Crome, MD;  Location: [place] [facility];  Service: Cardiovascular;  Laterality: N/A;  . CARDIAC CATHETERIZATION N/A 09/28/2015   Procedure: Left Heart Cath and Coronary Angiography;  Surgeon: Leonie Man, MD;  Location: [place] [facility];  Service: Cardiovascular;  Laterality: N/A;  . CARDIAC CATHETERIZATION N/A 09/28/2015   Procedure: Intravascular Pressure Wire/FFR Study;  Surgeon: Leonie Man, MD;  Location: [place] [facility];  Service: Cardiovascular;  Laterality: N/A;  . CARDIOVERSION N/A 10/23/2015   Procedure: CARDIOVERSION;  Surgeon: [name], MD;  Location: [facility];  Service: Cardiovascular;  Laterality: N/A;  . COLONOSCOPY  2013   per patient, rpt 5 yrs  . ELECTROPHYSIOLOGIC STUDY N/A 12/19/2015   Procedure: Atrial Fibrillation Ablation;  Surgeon: [name], MD;  Location: [place] [facility];  Service: Cardiovascular;  Laterality: N/A;  . KNEE ARTHROSCOPY Left 03/2016   Dr. [name]  . PATELLAR TENDON REPAIR Left 2008  . REPLACEMENT TOTAL KNEE Right 2006  . TOTAL KNEE ARTHROPLASTY Left 02/09/2017   Procedure: LEFT TOTAL KNEE ARTHROPLASTY, EXCISION LEFT DISTAL THIGH MASS;  Surgeon: [name], MD;  Location: [facility];  Service: Orthopedics;  Laterality: Left;  90 mins  . TRICEPS TENDON REPAIR Left 2013     Current Outpatient Medications  Medication Sig Dispense Refill  . Ascorbic Acid (VITAMIN C PO) Take 1 tablet by mouth daily.     [name] atorvastatin (LIPITOR) 40 MG tablet TAKE 1 TABLET DAILY 90 tablet 3  . Cholecalciferol (VITAMIN D3) 2000 units TABS Take 2,000 Units by mouth daily.    [name] diltiazem (CARTIA XT) 180 MG 24 hr capsule Take 1 capsule (180 mg total) by mouth daily. 90 capsule 3  . ezetimibe (ZETIA) 10 MG tablet  TAKE 1 TABLET DAILY 90 tablet 3  . furosemide (LASIX) 40 MG tablet Take 1 tablet (40 mg total) by mouth daily. 90 tablet 3  . losartan (COZAAR) 100 MG tablet TAKE 1 TABLET DAILY (Patient taking differently: Take 100 mg by mouth once daily) 90 tablet 3  . LYSINE PO Take 1 tablet by mouth daily.     . metoprolol succinate (TOPROL-XL) 100 MG 24 hr tablet Take 1 tablet (100 mg total) by mouth 2 (two) times daily. Take with or immediately following a meal. 180 tablet 3  . Naproxen Sodium (ALEVE) 220 MG CAPS Take 1 capsule by mouth as needed.    [name] omeprazole (PRILOSEC) 20 MG capsule Take 20 mg by mouth daily.      . potassium chloride (K-DUR,KLOR-CON) 10 MEQ tablet TAKE 1 TABLET TWICE A DAY (Patient taking differently: Take 10 mEq by mouth two times a day) 180 tablet 3  . umeclidinium-vilanterol (ANORO ELLIPTA) 62.5-25 MCG/INH AEPB Inhale 1 puff into the lungs daily. 180 each 1  . XARELTO 20 MG TABS tablet TAKE 1 TABLET DAILY WITH SUPPER 90 tablet 0   No current facility-administered medications for this visit.     Allergies:   Penicillins; Amlodipine; Lisinopril; Mevacor [lovastatin]; Vicodin [hydrocodone-acetaminophen]; Zocor [simvastatin]; and Oseltamivir phosphate   Social History:  The patient  reports that he has never smoked. He has never used smokeless tobacco. He reports that he drinks about 1.2 oz of alcohol per week. He reports that he does not use drugs.   Family History:  The patient's family history includes Asthma in his sister; COPD in his brother and father; Heart attack in his father; Heart disease in his brother; Hypertension in his father.    ROS:  Please see the history of present illness.   Otherwise, review of systems is positive for fatigue.   All other systems are reviewed and negative.   PHYSICAL EXAM: VS:  BP 134/82   Pulse 66   Ht 6' (1.829 m)   Wt 214 lb 6.4 oz (97.3 kg)   SpO2 96%   BMI 29.08 kg/m  , BMI Body mass index is 29.08 kg/m. GEN: Well nourished, well  developed, in no acute distress  HEENT: normal  Neck: no JVD, carotid bruits, or masses Cardiac: iRRR; no murmurs, rubs, or gallops,no edema  Respiratory:  clear to auscultation bilaterally, normal work of breathing GI: soft, nontender, nondistended, + BS MS: no deformity or atrophy  Skin: warm and dry Neuro:  Strength and sensation are intact Psych: euthymic mood, full affect  EKG:  EKG is ordered today. Personal review of the ekg ordered shows SR, RBBB, PVCs, left anterior fascicular block   Recent Labs: 03/02/2017: Pro B Natriuretic peptide (BNP) 124.0; TSH 1.43 03/04/2017: ALT 16; BUN 13; Creatinine, Ser 0.83; Hemoglobin 10.8; Platelets 186; Potassium 3.9; Sodium 134    Lipid Panel     Component Value Date/Time   CHOL 125 01/09/2017 0752   CHOL 121 07/29/2016 0822   TRIG 44.0 01/09/2017 0752  HDL 55.70 01/09/2017 0752   HDL 56 07/29/2016 0822   CHOLHDL 2 01/09/2017 0752   VLDL 8.8 01/09/2017 0752   LDLCALC 60 01/09/2017 0752   LDLCALC 56 07/29/2016 0822     Wt Readings from Last 3 Encounters:  07/13/17 214 lb 6.4 oz (97.3 kg)  07/08/17 217 lb 3.2 oz (98.5 kg)  05/12/17 216 lb 6.4 oz (98.2 kg)      Other studies Reviewed: Additional studies/ records that were reviewed today include: SPECT 10/17/15, TTE 10/09/14  Review of the above records today demonstrates:   The left ventricular ejection fraction is moderately decreased (30-44%).  Nuclear stress EF: 39%.  There was no ST segment deviation noted during stress.  There is a small defect of moderate severity present in the apical lateral and apex location and medium sized defect of moderate severity in the basal inferior, mild inferior and apical inferior location. These defects are fixed and consistent with scar. No ischemia noted.  This is an intermediate risk study due to LV dysfunction.   - Left ventricle: The cavity size was normal. Wall thickness was   normal. Systolic function was normal. The estimated  ejection   fraction was in the range of 55% to 60%. Wall motion was normal;   there were no regional wall motion abnormalities. Doppler   parameters are consistent with abnormal left ventricular   relaxation (grade 1 diastolic dysfunction). - Aortic valve: There was mild regurgitation. - Mitral valve: There was mild regurgitation. - Left atrium: The atrium was mildly dilated. - Right ventricle: The cavity size was mildly dilated. - Right atrium: The atrium was mildly dilated. - Pulmonary arteries: Systolic pressure was mildly increased. PA   peak pressure: 36 mm Hg (S).  Impressions:  - Normal LV systolic function; grade 1 diastolic dysfunction; mild   biatrial enlargement; mild RVE; calcified aortic valve with mild   AI; mild MR; trace TR with mildly elevated pulmonary pressure.   ASSESSMENT AND PLAN:  1.  Persistent atrial fibrillation: This post ablation 12/19/2015.  Currently on metoprolol, Xarelto, and diltiazem.  He is having some episodes of fatigue.  Will stop his diltiazem today.  This patients CHA2DS2-VASc Score and unadjusted Ischemic Stroke Rate (% per year) is equal to 3.2 % stroke rate/year from a score of 3  Above score calculated as 1 point each if present [CHF, HTN, DM, Vascular=MI/PAD/Aortic Plaque, Age if 65-74, or Male] Above score calculated as 2 points each if present [Age > 75, or Stroke/TIA/TE]   2. CAD: No chest pain.  Continue current management.  3. Chronic systolic heart failure: Aldactone, metoprolol, losartan.  Most recent echo results show a normal ejection fraction.  He is having some fatigue and there is currently an echo pending.  4. Hypertension: Controlled today.  5. Hyperlipidemia: Continue Lipitor  6. Ectatic abdominal aorta: Plan for repeat ultrasound in 1 year.    Current medicines are reviewed at length with the patient today.   The patient does not have concerns regarding his medicines.  The following changes were made today: Stop  diltiazem  Labs/ tests ordered today include:  Orders Placed This Encounter  Procedures  . EKG 12-Lead     Disposition:   FU with Will Camnitz 6 months  Signed, [name], MD  07/13/2017 9:52 AM     [facility] [address] [place] [address] [phone] (office) [phone] (fax)

## 2017-07-21 ENCOUNTER — Other Ambulatory Visit: Payer: Self-pay | Admitting: Family Medicine

## 2017-07-21 DIAGNOSIS — D696 Thrombocytopenia, unspecified: Secondary | ICD-10-CM

## 2017-07-21 DIAGNOSIS — E7800 Pure hypercholesterolemia, unspecified: Secondary | ICD-10-CM

## 2017-07-23 ENCOUNTER — Ambulatory Visit (INDEPENDENT_AMBULATORY_CARE_PROVIDER_SITE_OTHER): Payer: Medicare Other

## 2017-07-23 VITALS — BP 122/80 | HR 70 | Temp 97.9°F | Ht 70.0 in | Wt 214.5 lb

## 2017-07-23 DIAGNOSIS — Z0000 Encounter for general adult medical examination without abnormal findings: Secondary | ICD-10-CM

## 2017-07-23 DIAGNOSIS — E7800 Pure hypercholesterolemia, unspecified: Secondary | ICD-10-CM | POA: Diagnosis not present

## 2017-07-23 DIAGNOSIS — D696 Thrombocytopenia, unspecified: Secondary | ICD-10-CM | POA: Diagnosis not present

## 2017-07-23 LAB — CBC WITH DIFFERENTIAL/PLATELET
BASOS ABS: 0 10*3/uL (ref 0.0–0.1)
Basophils Relative: 0.2 % (ref 0.0–3.0)
EOS ABS: 0.1 10*3/uL (ref 0.0–0.7)
Eosinophils Relative: 1.1 % (ref 0.0–5.0)
HCT: 45 % (ref 39.0–52.0)
Hemoglobin: 15.9 g/dL (ref 13.0–17.0)
LYMPHS ABS: 2.3 10*3/uL (ref 0.7–4.0)
Lymphocytes Relative: 38.9 % (ref 12.0–46.0)
MCHC: 35.4 g/dL (ref 30.0–36.0)
MCV: 98.3 fl (ref 78.0–100.0)
MONOS PCT: 10.6 % (ref 3.0–12.0)
Monocytes Absolute: 0.6 10*3/uL (ref 0.1–1.0)
NEUTROS ABS: 2.9 10*3/uL (ref 1.4–7.7)
NEUTROS PCT: 49.2 % (ref 43.0–77.0)
PLATELETS: 149 10*3/uL — AB (ref 150.0–400.0)
RBC: 4.58 Mil/uL (ref 4.22–5.81)
RDW: 14.5 % (ref 11.5–15.5)
WBC: 5.9 10*3/uL (ref 4.0–10.5)

## 2017-07-23 LAB — COMPREHENSIVE METABOLIC PANEL
ALK PHOS: 64 U/L (ref 39–117)
ALT: 21 U/L (ref 0–53)
AST: 22 U/L (ref 0–37)
Albumin: 4.3 g/dL (ref 3.5–5.2)
BUN: 13 mg/dL (ref 6–23)
CO2: 31 meq/L (ref 19–32)
Calcium: 9.6 mg/dL (ref 8.4–10.5)
Chloride: 97 mEq/L (ref 96–112)
Creatinine, Ser: 0.93 mg/dL (ref 0.40–1.50)
GFR: 82.91 mL/min (ref >60.00)
GLUCOSE: 102 mg/dL — AB (ref 70–99)
POTASSIUM: 4.6 meq/L (ref 3.5–5.1)
SODIUM: 133 meq/L — AB (ref 135–145)
TOTAL PROTEIN: 6.9 g/dL (ref 6.0–8.3)
Total Bilirubin: 0.9 mg/dL (ref 0.2–1.2)

## 2017-07-23 LAB — LIPID PANEL
Cholesterol: 119 mg/dL (ref 0–200)
HDL: 53.3 mg/dL (ref >39.00)
LDL Cholesterol: 53 mg/dL (ref 0–99)
NONHDL: 65.48
Total CHOL/HDL Ratio: 2
Triglycerides: 62 mg/dL (ref 0.0–149.0)
VLDL: 12.4 mg/dL (ref 0.0–40.0)

## 2017-07-23 NOTE — Progress Notes (Signed)
Subjective:   Theodore [name] is a 83 y.o. male who presents for Medicare Annual/Subsequent preventive examination.  Review of Systems:  N/A Cardiac Risk Factors include: advanced age (>63men, >35 women);male gender;dyslipidemia;obesity (BMI >30kg/m2);hypertension     Objective:    Vitals: BP 122/80 (BP Location: Left Arm, Patient Position: Sitting, Cuff Size: Normal)   Pulse 70   Temp 97.9 F (36.6 C) (Oral)   Ht 5\' 10"  (1.778 m) Comment: no shoes  Wt 214 lb 8 oz (97.3 kg)   SpO2 98%   BMI 30.78 kg/m   Body mass index is 30.78 kg/m.  Advanced Directives 07/23/2017 03/03/2017 03/03/2017 02/13/2017 02/09/2017 01/28/2017 10/15/2016  Does Patient Have a Medical Advance Directive? Yes Yes Yes Yes Yes Yes Yes  Type of Advance Directive Living will;Healthcare Power of [place];Living will Healthcare Power of [place] will Living will [place];Living will  Does patient want to make changes to medical advance directive? - No - Patient declined - No - Patient declined No - Patient declined No - Patient declined -  Copy of [place] in Chart? Yes No - copy requested - No - copy requested - - No - copy requested    Tobacco Social History   Tobacco Use  Smoking Status Never Smoker  Smokeless Tobacco Never Used     Counseling given: No   Clinical Intake:  Pre-visit preparation completed: Yes  Pain : No/denies pain Pain Score: 0-No pain     Nutritional Status: BMI > 30  Obese Nutritional Risks: None Diabetes: No  How often do you need to have someone help you when you read instructions, pamphlets, or other written materials from your doctor or pharmacy?: 1 - Never What is the last grade level you completed in school?: Masters degree  Interpreter Needed?: No  Comments: pt lives with spouse Information entered by :: LPinson, LPN  Past Medical History:  Diagnosis Date  .  History of arthritis   . History of rheumatic fever   . Hypercholesterolemia   . Hypertension   . Nocturia   . PAF (paroxysmal atrial fibrillation) [facility]) January 2013   Placed on Pradaxa. Did not require cardioversion; spontaneously converted  . Peripheral edema   . Right bundle branch block   . SOB (shortness of breath)   . Tendonitis of elbow, left    Past Surgical History:  Procedure Laterality Date  . ATRIAL FIBRILLATION ABLATION  12/19/2015  . BACK SURGERY  12/24/09   fusion C3-C4  . BACK SURGERY  2010   fusion L4-L5  . CARDIAC CATHETERIZATION  2009   NONOBSTRUCTIVE ATHERSCLEROTIC CORONARY DISEASE AND NORMAL  LV FUNCTION  . CARDIAC CATHETERIZATION N/A 09/08/2014   Procedure: Left Heart Cath and Coronary Angiography;  Surgeon: Belva Crome, MD;  Location: [place] [facility];  Service: Cardiovascular;  Laterality: N/A;  . CARDIAC CATHETERIZATION N/A 09/28/2015   Procedure: Left Heart Cath and Coronary Angiography;  Surgeon: Leonie Man, MD;  Location: [place] [facility];  Service: Cardiovascular;  Laterality: N/A;  . CARDIAC CATHETERIZATION N/A 09/28/2015   Procedure: Intravascular Pressure Wire/FFR Study;  Surgeon: Leonie Man, MD;  Location: [place] [facility];  Service: Cardiovascular;  Laterality: N/A;  . CARDIOVERSION N/A 10/23/2015   Procedure: CARDIOVERSION;  Surgeon: [name], MD;  Location: [facility] ENDOSCOPY;  Service: Cardiovascular;  Laterality: N/A;  . COLONOSCOPY  2013   per patient, rpt 5 yrs  .  ELECTROPHYSIOLOGIC STUDY N/A 12/19/2015   Procedure: Atrial Fibrillation Ablation;  Surgeon: [name], MD;  Location: [place] [facility];  Service: Cardiovascular;  Laterality: N/A;  . KNEE ARTHROSCOPY Left 03/2016   Dr. [name]  . PATELLAR TENDON REPAIR Left 2008  . REPLACEMENT TOTAL KNEE Right 2006  . TOTAL KNEE ARTHROPLASTY Left 02/09/2017   Procedure: LEFT TOTAL KNEE ARTHROPLASTY, EXCISION LEFT DISTAL THIGH MASS;  Surgeon: [name], MD;  Location:  [facility];  Service: Orthopedics;  Laterality: Left;  90 mins  . TRICEPS TENDON REPAIR Left 2013   Family History  Problem Relation Age of Onset  . Heart attack Father   . COPD Father   . Hypertension Father   . Heart disease Brother   . COPD Brother   . Asthma Sister   . Stroke Neg Hx    Social History   Socioeconomic History  . Marital status: Married    Spouse name: Not on file  . Number of children: Not on file  . Years of education: Not on file  . Highest education level: Not on file  Occupational History  . 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 Financial resource strain: Not on file  . Food insecurity:    Worry: Not on file    Inability: Not on file  . Transportation needs:    Medical: Not on file    Non-medical: Not on file  Tobacco Use  . Smoking status: Never Smoker  . Smokeless tobacco: Never Used  Substance and Sexual Activity  . Alcohol use: Yes    Alcohol/week: 8.4 oz    Types: 14 Cans of beer per week    Comment: 2 beers daily  . Drug use: No  . Sexual activity: Not Currently  Lifestyle  . Physical activity:    Days per week: Not on file    Minutes per session: Not on file  . Stress: Not on file  Relationships  . Social connections:    Talks on phone: Not on file    Gets together: Not on file    Attends religious service: Not on file    Active member of club or organization: Not on file    Attends meetings of clubs or organizations: Not on file    Relationship status: Not on file  Other Topics Concern  . Not on file  Social History Narrative   Lives with wife, no pets   Retired   Occ: Art gallery manager   Edu: master's   Activity: golf   Diet: good water, fruits/vegetables daily    Outpatient Encounter Medications as of 07/23/2017  Medication Sig  . Ascorbic Acid (VITAMIN C PO) Take 1 tablet by mouth daily.   [name] atorvastatin (LIPITOR) 40 MG tablet TAKE 1 TABLET DAILY  . Cholecalciferol  (VITAMIN D3) 2000 units TABS Take 2,000 Units by mouth daily.  [name] ezetimibe (ZETIA) 10 MG tablet TAKE 1 TABLET DAILY  . furosemide (LASIX) 40 MG tablet Take 1 tablet (40 mg total) by mouth daily.  [name] losartan (COZAAR) 100 MG tablet TAKE 1 TABLET DAILY (Patient taking differently: Take 100 mg by mouth once daily)  . LYSINE PO Take 1 tablet by mouth daily.   . metoprolol succinate (TOPROL-XL) 100 MG 24 hr tablet Take 1 tablet (100 mg total) by mouth 2 (two) times daily. Take with or immediately following a meal.  . Naproxen Sodium (ALEVE) 220 MG CAPS Take  1 capsule by mouth as needed.  [name] omeprazole (PRILOSEC) 20 MG capsule Take 20 mg by mouth daily.    . potassium chloride (K-DUR,KLOR-CON) 10 MEQ tablet TAKE 1 TABLET TWICE A DAY (Patient taking differently: Take 10 mEq by mouth two times a day)  . umeclidinium-vilanterol (ANORO ELLIPTA) 62.5-25 MCG/INH AEPB Inhale 1 puff into the lungs daily.  Alveda Reasons 20 MG TABS tablet TAKE 1 TABLET DAILY WITH SUPPER  . [DISCONTINUED] diltiazem (CARTIA XT) 180 MG 24 hr capsule Take 1 capsule (180 mg total) by mouth daily.   No facility-administered encounter medications on file as of 07/23/2017.     Activities of Daily Living In your present state of health, do you have any difficulty performing the following activities: 07/23/2017 03/03/2017  Hearing? N N  Vision? N N  Difficulty concentrating or making decisions? N N  Walking or climbing stairs? N Y  Dressing or bathing? N N  Doing errands, shopping? N N  Preparing Food and eating ? N -  Using the Toilet? N -  In the past six months, have you accidently leaked urine? N -  Do you have problems with loss of bowel control? N -  Managing your Medications? N -  Managing your Finances? N -  Housekeeping or managing your Housekeeping? N -  Some recent data might be hidden    Patient Care Team: [name], MD as PCP - General (Family Medicine) Skeet Latch, MD as Attending Physician  (Cardiology) Druscilla Brownie, MD as Consulting Physician (Dermatology) Deneise Lever, MD as Consulting Physician (Pulmonary Disease) Estill Cotta, MD as Referring Physician (Ophthalmology)   Assessment:   This is a routine wellness examination for Theodore [name].   Hearing Screening   125Hz  250Hz  500Hz  1000Hz  2000Hz  3000Hz  4000Hz  6000Hz  8000Hz   Right ear:   40 40 40  0    Left ear:   40 40 40  0    Vision Screening Comments: Vision exam in Sept 2018 with Dr. Sandra Cockayne    Exercise Activities and Dietary recommendations Current Exercise Habits: Home exercise routine, Type of exercise: Other - see comments(stationary bike ), Time (Minutes): 30, Frequency (Times/Week): 2, Weekly Exercise (Minutes/Week): 60, Intensity: Mild, Exercise limited by: None identified  Goals    . Increase physical activity     Starting 07/23/2017, I will continue to use stationary bike for 20-30 minutes twice weekly and to resume golf and walking when tolerated.        Fall Risk Fall Risk  07/23/2017 01/13/2017 07/17/2016 02/06/2016 08/07/2015  Falls in the past year? No No No No No   Depression Screen PHQ 2/9 Scores 07/23/2017 01/13/2017 07/17/2016 02/06/2016  PHQ - 2 Score 0 0 0 0  PHQ- 9 Score 0 - - -    Cognitive Function MMSE - Mini Mental State Exam 07/23/2017 07/17/2016 08/07/2015  Orientation to time 5 5 5   Orientation to Place 5 5 5   Registration 3 3 3   Attention/ Calculation 0 0 0  Recall 3 3 3   Language- name 2 objects 0 0 0  Language- repeat 1 1 1   Language- follow 3 step command 3 3 3   Language- read & follow direction 0 0 0  Write a sentence 0 0 0  Copy design 0 0 0  Total score 20 20 20      PLEASE NOTE: A Mini-Cog screen was completed. Maximum score is 20. A value of 0 denotes this part of [place] MMSE was not completed or the patient failed  this part of the Mini-Cog screening.   Mini-Cog Screening Orientation to Time - Max 5 pts Orientation to Place - Max 5 pts Registration - Max 3  pts Recall - Max 3 pts Language Repeat - Max 1 pts Language Follow 3 Step Command - Max 3 pts   Immunization History  Administered Date(s) Administered  . Influenza Split 11/11/2010, 11/11/2011, 11/23/2012  . Influenza Whole 11/10/2009  . Influenza, High Dose Seasonal PF 11/17/2016  . Influenza,inj,Quad PF,6+ Mos 10/25/2015  . Influenza-Unspecified 11/10/2013, 10/23/2014  . Pneumococcal Conjugate-13 10/23/2014  . Pneumococcal Polysaccharide-23 11/10/2009  . Zoster 02/11/2012    Screening Tests Health Maintenance  Topic Date Due  . DTaP/Tdap/Td (1 - Tdap) 08/06/2017 (Originally 09/12/1953)  . TETANUS/TDAP  08/06/2017 (Originally 09/12/1953)  . INFLUENZA VACCINE  09/10/2017  . PNA vac Low Risk Adult  Completed       Plan:     I have personally reviewed, addressed, and noted the following in the patient's chart:  A. Medical and social history B. Use of alcohol, tobacco or illicit drugs  C. Current medications and supplements D. Functional ability and status E.  Nutritional status F.  Physical activity G. Advance directives H. List of other physicians I.  Hospitalizations, surgeries, and ER visits in previous 12 months J.  [place] to include hearing, vision, cognitive, depression L. Referrals and appointments - none  In addition, I have reviewed and discussed with patient certain preventive protocols, quality metrics, and best practice recommendations. A written personalized care plan for preventive services as well as general preventive health recommendations were provided to patient.  See attached scanned questionnaire for additional information.   Signed,   Lindell Noe, MHA, BS, LPN Health Coach

## 2017-07-23 NOTE — Progress Notes (Signed)
PCP notes:   Health maintenance:  No gaps identified.   Abnormal screenings:   Hearing - failed  Hearing Screening   125Hz  250Hz  500Hz  1000Hz  2000Hz  3000Hz  4000Hz  6000Hz  8000Hz   Right ear:   40 40 40  0    Left ear:   40 40 40  0     Patient concerns:   None  Nurse concerns:  None  Next PCP appt:   01/19/18 @ 0830

## 2017-07-23 NOTE — Patient Instructions (Signed)
[name] , Thank you for taking time to come for your Medicare Wellness Visit. I appreciate your ongoing commitment to your health goals. Please review the following plan we discussed and let me know if I can assist you in the future.   These are the goals we discussed: Goals    . Increase physical activity     Starting 07/23/2017, I will continue to use stationary bike for 20-30 minutes twice weekly and to resume golf and walking when tolerated.        This is a list of the screening recommended for you and due dates:  Health Maintenance  Topic Date Due  . DTaP/Tdap/Td vaccine (1 - Tdap) 08/06/2017*  . Tetanus Vaccine  08/06/2017*  . Flu Shot  09/10/2017  . Pneumonia vaccines  Completed  *Topic was postponed. The date shown is not the original due date.   Preventive Care for Adults  A healthy lifestyle and preventive care can promote health and wellness. Preventive health guidelines for adults include the following key practices.  . A routine yearly physical is a good way to check with your health care provider about your health and preventive screening. It is a chance to share any concerns and updates on your health and to receive a thorough exam.  . Visit your dentist for a routine exam and preventive care every 6 months. Brush your teeth twice a day and floss once a day. Good oral hygiene prevents tooth decay and gum disease.  . The frequency of eye exams is based on your age, health, family medical history, use  of contact lenses, and other factors. Follow your health care provider's recommendations for frequency of eye exams.  . Eat a healthy diet. Foods like vegetables, fruits, whole grains, low-fat dairy products, and lean protein foods contain the nutrients you need without too many calories. Decrease your intake of foods high in solid fats, added sugars, and salt. Eat the right amount of calories for you. Get information about a proper diet from your health care provider, if  necessary.  . Regular physical exercise is one of the most important things you can do for your health. Most adults should get at least 150 minutes of moderate-intensity exercise (any activity that increases your heart rate and causes you to sweat) each week. In addition, most adults need muscle-strengthening exercises on 2 or more days a week.  Silver Sneakers may be a benefit available to you. To determine eligibility, you may visit the website: www.silversneakers.com or contact program at (540)765-9075 Mon-Fri between 8AM-8PM.   . Maintain a healthy weight. The body mass index (BMI) is a screening tool to identify possible weight problems. It provides an estimate of body fat based on height and weight. Your health care provider can find your BMI and can help you achieve or maintain a healthy weight.   For adults 20 years and older: ? A BMI below 18.5 is considered underweight. ? A BMI of 18.5 to 24.9 is normal. ? A BMI of 25 to 29.9 is considered overweight. ? A BMI of 30 and above is considered obese.   . Maintain normal blood lipids and cholesterol levels by exercising and minimizing your intake of saturated fat. Eat a balanced diet with plenty of fruit and vegetables. Blood tests for lipids and cholesterol should begin at age 25 and be repeated every 5 years. If your lipid or cholesterol levels are high, you are over 50, or you are at high risk for heart  disease, you may need your cholesterol levels checked more frequently. Ongoing high lipid and cholesterol levels should be treated with medicines if diet and exercise are not working.  . If you smoke, find out from your health care provider how to quit. If you do not use tobacco, please do not start.  . If you choose to drink alcohol, please do not consume more than 2 drinks per day. One drink is considered to be 12 ounces (355 mL) of beer, 5 ounces (148 mL) of wine, or 1.5 ounces (44 mL) of liquor.  . If you are 75-52 years old, ask your  health care provider if you should take aspirin to prevent strokes.  . Use sunscreen. Apply sunscreen liberally and repeatedly throughout the day. You should seek shade when your shadow is shorter than you. Protect yourself by wearing long sleeves, pants, a wide-brimmed hat, and sunglasses year round, whenever you are outdoors.  . Once a month, do a whole body skin exam, using a mirror to look at the skin on your back. Tell your health care provider of new moles, moles that have irregular borders, moles that are larger than a pencil eraser, or moles that have changed in shape or color.

## 2017-07-27 NOTE — Progress Notes (Signed)
I reviewed health advisor's note, was available for consultation, and agree with documentation and plan.  

## 2017-08-31 ENCOUNTER — Encounter: Payer: Self-pay | Admitting: Family Medicine

## 2017-08-31 DIAGNOSIS — Z1211 Encounter for screening for malignant neoplasm of colon: Secondary | ICD-10-CM

## 2017-09-06 NOTE — Telephone Encounter (Signed)
I have asked patient to come to pick up iFOB.

## 2017-09-14 ENCOUNTER — Other Ambulatory Visit (INDEPENDENT_AMBULATORY_CARE_PROVIDER_SITE_OTHER): Payer: Medicare Other

## 2017-09-14 DIAGNOSIS — Z1211 Encounter for screening for malignant neoplasm of colon: Secondary | ICD-10-CM | POA: Diagnosis not present

## 2017-09-14 LAB — FECAL OCCULT BLOOD, IMMUNOCHEMICAL: Fecal Occult Bld: NEGATIVE

## 2017-09-14 LAB — FECAL OCCULT BLOOD, GUAIAC: Fecal Occult Blood: NEGATIVE

## 2017-09-16 ENCOUNTER — Encounter: Payer: Self-pay | Admitting: Family Medicine

## 2017-10-08 ENCOUNTER — Encounter: Payer: Self-pay | Admitting: Cardiovascular Disease

## 2017-10-08 ENCOUNTER — Ambulatory Visit (INDEPENDENT_AMBULATORY_CARE_PROVIDER_SITE_OTHER): Payer: Medicare Other | Admitting: Cardiovascular Disease

## 2017-10-08 VITALS — BP 122/76 | HR 73 | Ht 72.0 in | Wt 219.6 lb

## 2017-10-08 DIAGNOSIS — E785 Hyperlipidemia, unspecified: Secondary | ICD-10-CM

## 2017-10-08 DIAGNOSIS — I481 Persistent atrial fibrillation: Secondary | ICD-10-CM

## 2017-10-08 DIAGNOSIS — I10 Essential (primary) hypertension: Secondary | ICD-10-CM | POA: Diagnosis not present

## 2017-10-08 DIAGNOSIS — I4819 Other persistent atrial fibrillation: Secondary | ICD-10-CM

## 2017-10-08 DIAGNOSIS — I2510 Atherosclerotic heart disease of native coronary artery without angina pectoris: Secondary | ICD-10-CM | POA: Diagnosis not present

## 2017-10-08 NOTE — Progress Notes (Signed)
Cardiology Office Note   Date:  10/08/2017   ID:  Theodore [name], DOB 13-Oct-1934, MRN [id number]  PCP:  [name], MD  Cardiologist:   Skeet Latch, MD   No chief complaint on file.    History of Present Illness: Theodore [name] is a 83 y.o. male with chronic systolic and diastolic heart failure, hypertension, hyperlipidemia, paroxysmal atrial fibrillation, asthma and Rheumatic fever who presents for follow up.  Theodore [name] was previously a patient of Dr. Mare Ferrari.  Theodore [name] was diagnosed with atrial fibrillation in 2014.  He was initially on Pradaxa and switched to Xarelto.  He had an episode of chest pain 08/2014 and had a Lexiscan Myoview 08/31/14 that showed an old inferior scar with peri-infarct ischemia and LVEF 49%.  He then had a LHC that revealed 70% ostial LAD, 50% prox LAD, 30% OM1 and 35% RCA.  He was managed medically.  He subsequently had an echo 09/2014 that showed LVEF 55-60% with mild MR and AR and grade 1 diastolic dysfunction.  At that time atorvastatin was increased to 80 mg but he noted leg pain.  He was seen in the lipid clinic and started back on atorvastatin 40mg  and zetia 10 mg.    Theodore [name] was seen in clinic 09/25/15 and reported significant shortness of breath with minimal exertion.  He had a LHC with Dr. Ellyn [name] on 09/28/15 that again revealed a 70% ostial-proximal LAD lesion.  Dr. Ellyn [name] performed FFR on that lesion and there was no significant change.  The left ventriculogram performed at that time revealed LVEF 35-45%.  Theodore [name] continued to have exertional dyspnea so he was referred for [facility] 10/17/15 that was negative for ischemia. He subsequently underwent DCCV on 10/23/15.  He underwent ablation on 12/19/15 with Dr. Curt [name] and has remained in sinus rhythm.    Theodore [name] underwent L knee replacement on  02/09/17.  Xarelto was held for 2 days prior to surgery and resumed post-operatively.  He developed bleeding at the incision site so Xarelto was  again held and he was switched to aspirin.  He subsequently developed a left lower extremity DVT and pulmonary embolism.  Xarelto was resumed.  He denies shortness of breath but has reported significant cough.  The swelling in his leg has improved.  He is working with physical therapy and feeling better.  He had an echocardiogram 03/04/17 that revealed LVEF 50-55% with moderate to severe pulmonary arterial hypertension with a PASP of 60 mmHg.  Since his last appointment Theodore [name] has been doing well.  He is finally recovering from his knee surgery.  He has not experienced any chest pain and his breathing has been stable.  He is starting to get more active.  He started back to golfing.  He has no exertional symptoms.  He still has some mild lower extremity edema in the left leg but this is stable and is improved since we increase Lasix at his last visit.  He saw Dr. Curt [name] 07/2017 and was doing well.  Diltiazem was discontinued.  He has not had any known episodes of recurrent atrial fibrillation.    Past Medical History:  Diagnosis Date  . History of arthritis   . History of rheumatic fever   . Hypercholesterolemia   . Hypertension   . Nocturia   . PAF (paroxysmal atrial fibrillation) [facility]) January 2013   Placed on Pradaxa. Did not require cardioversion; spontaneously converted  . Peripheral edema   . Right bundle  branch block   . SOB (shortness of breath)   . Tendonitis of elbow, left     Past Surgical History:  Procedure Laterality Date  . ATRIAL FIBRILLATION ABLATION  12/19/2015  . BACK SURGERY  12/24/09   fusion C3-C4  . BACK SURGERY  2010   fusion L4-L5  . CARDIAC CATHETERIZATION  2009   NONOBSTRUCTIVE ATHERSCLEROTIC CORONARY DISEASE AND NORMAL  LV FUNCTION  . CARDIAC CATHETERIZATION N/A 09/08/2014   Procedure: Left Heart Cath and Coronary Angiography;  Surgeon: Belva Crome, MD;  Location: [place] [facility];  Service: Cardiovascular;  Laterality: N/A;  . CARDIAC CATHETERIZATION  N/A 09/28/2015   Procedure: Left Heart Cath and Coronary Angiography;  Surgeon: Leonie Man, MD;  Location: [place] [facility];  Service: Cardiovascular;  Laterality: N/A;  . CARDIAC CATHETERIZATION N/A 09/28/2015   Procedure: Intravascular Pressure Wire/FFR Study;  Surgeon: Leonie Man, MD;  Location: [place] [facility];  Service: Cardiovascular;  Laterality: N/A;  . CARDIOVERSION N/A 10/23/2015   Procedure: CARDIOVERSION;  Surgeon: [name], MD;  Location: [facility] ENDOSCOPY;  Service: Cardiovascular;  Laterality: N/A;  . COLONOSCOPY  2013   per patient, rpt 5 yrs  . ELECTROPHYSIOLOGIC STUDY N/A 12/19/2015   Procedure: Atrial Fibrillation Ablation;  Surgeon: [name], MD;  Location: [place] [facility];  Service: Cardiovascular;  Laterality: N/A;  . KNEE ARTHROSCOPY Left 03/2016   Dr. [name]  . PATELLAR TENDON REPAIR Left 2008  . REPLACEMENT TOTAL KNEE Right 2006  . TOTAL KNEE ARTHROPLASTY Left 02/09/2017   Procedure: LEFT TOTAL KNEE ARTHROPLASTY, EXCISION LEFT DISTAL THIGH MASS;  Surgeon: [name], MD;  Location: [facility] ORS;  Service: Orthopedics;  Laterality: Left;  90 mins  . TRICEPS TENDON REPAIR Left 2013     Current Outpatient Medications  Medication Sig Dispense Refill  . Ascorbic Acid (VITAMIN C PO) Take 1 tablet by mouth daily.     [name] atorvastatin (LIPITOR) 40 MG tablet TAKE 1 TABLET DAILY 90 tablet 3  . Cholecalciferol (VITAMIN D3) 2000 units TABS Take 2,000 Units by mouth daily.    [name] ezetimibe (ZETIA) 10 MG tablet TAKE 1 TABLET DAILY 90 tablet 3  . furosemide (LASIX) 40 MG tablet Take 1 tablet (40 mg total) by mouth daily. 90 tablet 3  . losartan (COZAAR) 100 MG tablet TAKE 1 TABLET DAILY (Patient taking differently: Take 100 mg by mouth once daily) 90 tablet 3  . LYSINE PO Take 1 tablet by mouth daily.     . metoprolol succinate (TOPROL-XL) 100 MG 24 hr tablet Take 1 tablet (100 mg total) by mouth 2 (two) times daily. Take with or immediately following a meal.  180 tablet 3  . Naproxen Sodium (ALEVE) 220 MG CAPS Take 1 capsule by mouth as needed.    [name] omeprazole (PRILOSEC) 20 MG capsule Take 20 mg by mouth daily.      . potassium chloride (K-DUR,KLOR-CON) 10 MEQ tablet TAKE 1 TABLET TWICE A DAY (Patient taking differently: Take 10 mEq by mouth two times a day) 180 tablet 3  . umeclidinium-vilanterol (ANORO ELLIPTA) 62.5-25 MCG/INH AEPB Inhale 1 puff into the lungs daily. 180 each 1  . XARELTO 20 MG TABS tablet TAKE 1 TABLET DAILY WITH SUPPER 90 tablet 0   No current facility-administered medications for this visit.     Allergies:   Penicillins; Amlodipine; Lisinopril; Mevacor [lovastatin]; Vicodin [hydrocodone-acetaminophen]; Zocor [simvastatin]; and Oseltamivir phosphate    Social History:  The patient  reports  that he has never smoked. He has never used smokeless tobacco. He reports that he drinks about 14.0 standard drinks of alcohol per week. He reports that he does not use drugs.   Family History:  The patient's family history includes Asthma in his sister; COPD in his brother and father; Heart attack in his father; Heart disease in his brother; Hypertension in his father.    ROS:  Please see the history of present illness.   Otherwise, review of systems are positive for none.   All other systems are reviewed and negative.    PHYSICAL EXAM: VS:  BP 122/76   Pulse 73   Ht 6' (1.829 m)   Wt 219 lb 9.6 oz (99.6 kg)   SpO2 96% Comment: on room air  BMI 29.78 kg/m  , BMI Body mass index is 29.78 kg/m. GENERAL:  Well appearing HEENT: Pupils equal round and reactive, fundi not visualized, oral mucosa unremarkable NECK:  No jugular venous distention, waveform within normal limits, carotid upstroke brisk and symmetric, no bruits LUNGS:  Clear to auscultation bilaterally HEART:  RRR.  PMI not displaced or sustained,S1 and S2 within normal limits, no S3, no S4, no clicks, no rubs, no murmurs ABD:  Flat, positive bowel sounds normal in frequency  in pitch, no bruits, no rebound, no guarding, no midline pulsatile mass, no hepatomegaly, no splenomegaly EXT:  2 plus pulses throughout, trace LE edema, no cyanosis no clubbing SKIN:  No rashes no nodules NEURO:  Cranial nerves II through XII grossly intact, motor grossly intact throughout PSYCH:  Cognitively intact, oriented to person place and time   EKG:  EKG is not ordered today. 10/30/15: Sinus rhythm.  Rate 60 bpm.  RBBB.  LAD.  The ekg ordered 09/25/15 demonstrates atrial fibrillation.  Rate 84 bpm.   Echo 03/04/17: Study Conclusions  - Left ventricle: The cavity size was normal. There was mild   concentric hypertrophy. Systolic function was normal. The   estimated ejection fraction was in the range of 50% to 55%. Wall   motion was normal; there were no regional wall motion   abnormalities. - Ventricular septum: The contour showed diastolic flattening. - Aortic valve: There was trivial regurgitation. - Pulmonary arteries: Systolic pressure was moderately to severely   increased. PA peak pressure: 60 mm Hg (S).  Lexiscan Myoview 10/17/15:  The left ventricular ejection fraction is moderately decreased (30-44%).  Nuclear stress EF: 39%.  There was no ST segment deviation noted during stress.  There is a small defect of moderate severity present in the apical lateral and apex location and medium sized defect of moderate severity in the basal inferior, mild inferior and apical inferior location. These defects are fixed and consistent with scar. No ischemia noted.  This is an intermediate risk study due to LV dysfunction.   LHC 09/28/15:  Ostial to proximal LAD stenoses of this at least moderately calcified. 70% focal stenosis followed by diffuse 50% stenosis.  FFR evaluation of the combination of the LAD lesions showed no significant drop in baseline FFR from 0.96 down to 0.95. This suggests physiologically not significant lesion.  There is moderate left ventricular  systolic dysfunction. The left ventricular ejection fraction is 35-45% by visual estimate.  LV end diastolic pressure is moderately elevated.  LHC 09/08/14:   1. 1st Mrg lesion, 30% stenosed. 2. Prox LAD lesion, 50% stenosed. 3. Ost LAD lesion, 70% stenosed. 4. Prox RCA to Dist RCA lesion, 35% stenosed.   50-70% ostial/proximal LAD stenosis  within the heavily calcified segment. Recent myocardial perfusion energy and within the past week did not demonstrate anterior ischemia.  Heavily calcified but widely patent circumflex and right coronary with luminal irregularities noted in the proximal mid and distal segments.  Mild global left ventricular dysfunction with an ejection fraction in the 45-50% range   Recommendations:   The patient's symptoms are mixed. With exertion he experiences dyspnea. At rest and randomly he has neck jaw and left elbow discomfort. When both the angiogram and the myocardial perfusion study were considered together, no justification for percutaneous intervention on the LAD could be made.  Close clinical follow-up  Aggressive risk factor modification    Recent Labs: 03/02/2017: Pro B Natriuretic peptide (BNP) 124.0; TSH 1.43 07/23/2017: ALT 21; BUN 13; Creatinine, Ser 0.93; Hemoglobin 15.9; Platelets 149.0; Potassium 4.6; Sodium 133    Lipid Panel    Component Value Date/Time   CHOL 119 07/23/2017 0912   CHOL 121 07/29/2016 0822   TRIG 62.0 07/23/2017 0912   HDL 53.30 07/23/2017 0912   HDL 56 07/29/2016 0822   CHOLHDL 2 07/23/2017 0912   VLDL 12.4 07/23/2017 0912   LDLCALC 53 07/23/2017 0912   LDLCALC 56 07/29/2016 0822      Wt Readings from Last 3 Encounters:  10/08/17 219 lb 9.6 oz (99.6 kg)  07/23/17 214 lb 8 oz (97.3 kg)  07/13/17 214 lb 6.4 oz (97.3 kg)      ASSESSMENT AND PLAN:  # Paroxysmal atrial fibrillation: No recurrent episode since his ablation 12/2015. Continue metoprolol and Xarelto.   This patients CHA2DS2-VASc Score  and unadjusted Ischemic Stroke Rate (% per year) is equal to 7.2 % stroke rate/year from a score of 5  Above score calculated as 1 point each if present [CHF, HTN, DM, Vascular=MI/PAD/Aortic Plaque, Age if 65-74, or Male] Above score calculated as 2 points each if present [Age > 75, or Stroke/TIA/TE]   # Pulmonary embolism: # Pulmonary hypertension: # L LE DVT:  Occurred in the setting of knee replacement.  Resolved.  He is on Xarelto for atrial fibrillation.    # CAD:  # Hyperlipidemia:   Mr. Billups has a known, 70% ostial LAD lesion. He is asymptomatic. He is not on aspirin or P2Y12 ihnibitor because he is on Xarelto. Continue atorvastatin, Zetia and metoprolol.  LDL 53 07/2017.   # Chronic systolic and diastolic heart failure: # Hypertension:  LVEF improved to 55-60% on echo 07/2017.  Continue metoprolol and losartan.        Current medicines are reviewed at length with the patient today.  The patient does not have concerns regarding medicines.  The following changes have been made: none  Labs/ tests ordered today include:   No orders of the defined types were placed in this encounter.    Disposition:   FU with Robbert Langlinais C. [name], MD, [facility] in 1 year.     Signed, Lavelle Akel C. [name], MD, [facility]  10/08/2017 8:21 AM    [place]

## 2017-10-08 NOTE — Patient Instructions (Signed)
Medication Instructions:  Your physician recommends that you continue on your current medications as directed. Please refer to the Current Medication list given to you today.  Labwork: NONE  Testing/Procedures: NONE  Follow-Up: Your physician wants you to follow-up in: 1 YEAR  You will receive a reminder letter in the mail two months in advance. If you don't receive a letter, please call our office to schedule the follow-up appointment.  If you need a refill on your cardiac medications before your next appointment, please call your pharmacy.  

## 2017-10-13 ENCOUNTER — Other Ambulatory Visit: Payer: Self-pay | Admitting: Cardiovascular Disease

## 2017-10-13 NOTE — Telephone Encounter (Signed)
Rx sent to pharmacy   

## 2017-12-08 ENCOUNTER — Other Ambulatory Visit: Payer: Self-pay | Admitting: Cardiovascular Disease

## 2017-12-29 ENCOUNTER — Ambulatory Visit: Payer: Medicare Other | Admitting: Nurse Practitioner

## 2017-12-29 ENCOUNTER — Encounter: Payer: Self-pay | Admitting: Nurse Practitioner

## 2017-12-29 ENCOUNTER — Ambulatory Visit (INDEPENDENT_AMBULATORY_CARE_PROVIDER_SITE_OTHER): Payer: Medicare Other

## 2017-12-29 ENCOUNTER — Ambulatory Visit: Payer: Self-pay

## 2017-12-29 ENCOUNTER — Ambulatory Visit: Payer: Medicare Other | Admitting: Internal Medicine

## 2017-12-29 VITALS — BP 118/78 | HR 77 | Temp 98.8°F | Ht 72.0 in | Wt 220.8 lb

## 2017-12-29 DIAGNOSIS — J441 Chronic obstructive pulmonary disease with (acute) exacerbation: Secondary | ICD-10-CM

## 2017-12-29 DIAGNOSIS — R5383 Other fatigue: Secondary | ICD-10-CM

## 2017-12-29 DIAGNOSIS — R5381 Other malaise: Secondary | ICD-10-CM | POA: Diagnosis not present

## 2017-12-29 DIAGNOSIS — J189 Pneumonia, unspecified organism: Secondary | ICD-10-CM

## 2017-12-29 DIAGNOSIS — J00 Acute nasopharyngitis [common cold]: Secondary | ICD-10-CM

## 2017-12-29 LAB — POC INFLUENZA A&B (BINAX/QUICKVUE)
INFLUENZA A, POC: NEGATIVE
INFLUENZA B, POC: NEGATIVE

## 2017-12-29 MED ORDER — SALINE SPRAY 0.65 % NA SOLN: 1.0000 | NASAL | 0 refills | Status: DC | PRN

## 2017-12-29 MED ORDER — BENZONATATE 100 MG PO CAPS: 100.0000 mg | ORAL_CAPSULE | Freq: Three times a day (TID) | ORAL | 0 refills | Status: DC | PRN

## 2017-12-29 MED ORDER — LEVOFLOXACIN 500 MG PO TABS: 500.0000 mg | ORAL_TABLET | Freq: Every day | ORAL | 0 refills | Status: DC

## 2017-12-29 MED ORDER — ALBUTEROL SULFATE HFA 108 (90 BASE) MCG/ACT IN AERS: 1.0000 | INHALATION_SPRAY | Freq: Four times a day (QID) | RESPIRATORY_TRACT | 0 refills | Status: DC | PRN

## 2017-12-29 MED ORDER — FLUTICASONE PROPIONATE 50 MCG/ACT NA SUSP: 2.0000 | Freq: Every day | NASAL | 0 refills | Status: DC

## 2017-12-29 MED ORDER — DM-GUAIFENESIN ER 30-600 MG PO TB12: 1.0000 | ORAL_TABLET | Freq: Two times a day (BID) | ORAL | 0 refills | Status: DC | PRN

## 2017-12-29 MED ORDER — METHYLPREDNISOLONE ACETATE 40 MG/ML IJ SUSP
40.0000 mg | Freq: Once | INTRAMUSCULAR | Status: AC
  Administered 2017-12-29: 40 mg via INTRAMUSCULAR

## 2017-12-29 NOTE — Patient Instructions (Addendum)
CXR indicates bronchitis and pneumonia. levaquin sent. F/up with pcp in 3weeks for repeat CXR and re eval of lung sounds  Flu swab was negative.   Upper Respiratory Infection, Adult Most upper respiratory infections (URIs) are caused by a virus. A URI affects the nose, throat, and upper air passages. The most common type of URI is often called "the common cold." Follow these instructions at home:  Take medicines only as told by your doctor.  Gargle warm saltwater or take cough drops to comfort your throat as told by your doctor.  Use a warm mist humidifier or inhale steam from a shower to increase air moisture. This may make it easier to breathe.  Drink enough fluid to keep your pee (urine) clear or pale yellow.  Eat soups and other clear broths.  Have a healthy diet.  Rest as needed.  Go back to work when your fever is gone or your doctor says it is okay. ? You may need to stay home longer to avoid giving your URI to others. ? You can also wear a face mask and wash your hands often to prevent spread of the virus.  Use your inhaler more if you have asthma.  Do not use any tobacco products, including cigarettes, chewing tobacco, or electronic cigarettes. If you need help quitting, ask your doctor. Contact a doctor if:  You are getting worse, not better.  Your symptoms are not helped by medicine.  You have chills.  You are getting more short of breath.  You have brown or red mucus.  You have yellow or brown discharge from your nose.  You have pain in your face, especially when you bend forward.  You have a fever.  You have puffy (swollen) neck glands.  You have pain while swallowing.  You have white areas in the back of your throat. Get help right away if:  You have very bad or constant: ? Headache. ? Ear pain. ? Pain in your forehead, behind your eyes, and over your cheekbones (sinus pain). ? Chest pain.  You have long-lasting (chronic) lung disease and  any of the following: ? Wheezing. ? Long-lasting cough. ? Coughing up blood. ? A change in your usual mucus.  You have a stiff neck.  You have changes in your: ? Vision. ? Hearing. ? Thinking. ? Mood. This information is not intended to replace advice given to you by your health care provider. Make sure you discuss any questions you have with your health care provider. Document Released: 07/16/2007 Document Revised: 09/30/2015 Document Reviewed: 05/04/2013 Elsevier Interactive Patient Education  2018 [employer].

## 2017-12-29 NOTE — Progress Notes (Signed)
Subjective:  Patient ID: Theodore [name], male    DOB: Mar 08, 1934  Age: 83 y.o. MRN: [id number]  CC: Generalized Body Aches (Patient is here today C/O body aches that started 3-4 days ago.  Then 1.5 days ago started with a nonproductive cough, congestion, and fatigue.  He has Tx with Mucinex, Advil OTC, and tried leftover cough syrup with codeine which kept him up last hs.  Stayed in chair all night.)  URI   This is a new problem. The current episode started in the past 7 days. The problem has been gradually worsening. There has been no fever. Associated symptoms include chest pain, congestion, coughing, headaches, rhinorrhea, sinus pain, sneezing, a sore throat, swollen glands and wheezing. He has tried acetaminophen for the symptoms. The treatment provided no relief.    Reviewed past Medical, Social and Family history today.  Outpatient Medications Prior to Visit  Medication Sig Dispense Refill  . Ascorbic Acid (VITAMIN C PO) Take 1 tablet by mouth daily.     [name] atorvastatin (LIPITOR) 40 MG tablet TAKE 1 TABLET DAILY 90 tablet 3  . Cholecalciferol (VITAMIN D3) 2000 units TABS Take 2,000 Units by mouth daily.    [name] ezetimibe (ZETIA) 10 MG tablet TAKE 1 TABLET DAILY 90 tablet 3  . furosemide (LASIX) 40 MG tablet Take 1 tablet (40 mg total) by mouth daily. 90 tablet 3  . losartan (COZAAR) 100 MG tablet Take 100 mg by mouth once daily 90 tablet 3  . LYSINE PO Take 1 tablet by mouth daily.     . metoprolol succinate (TOPROL-XL) 100 MG 24 hr tablet Take 1 tablet (100 mg total) by mouth 2 (two) times daily. Take with or immediately following a meal. 180 tablet 3  . Naproxen Sodium (ALEVE) 220 MG CAPS Take 1 capsule by mouth as needed.    [name] omeprazole (PRILOSEC) 20 MG capsule Take 20 mg by mouth daily.      . potassium chloride (K-DUR,KLOR-CON) 10 MEQ tablet TAKE 1 TABLET TWICE A DAY 180 tablet 4  . umeclidinium-vilanterol (ANORO ELLIPTA) 62.5-25 MCG/INH AEPB Inhale 1 puff into the lungs daily. 180  each 1  . XARELTO 20 MG TABS tablet TAKE 1 TABLET DAILY WITH SUPPER 90 tablet 1   No facility-administered medications prior to visit.     ROS See HPI  Objective:  BP 118/78 (BP Location: Left Arm, Patient Position: Sitting, Cuff Size: Normal)   Pulse 77   Temp 98.8 F (37.1 C) (Oral)   Ht 6' (1.829 m)   Wt 220 lb 12.8 oz (100.2 kg)   SpO2 95%   BMI 29.95 kg/m   BP Readings from Last 3 Encounters:  12/29/17 118/78  10/08/17 122/76  07/23/17 122/80    Wt Readings from Last 3 Encounters:  12/29/17 220 lb 12.8 oz (100.2 kg)  10/08/17 219 lb 9.6 oz (99.6 kg)  07/23/17 214 lb 8 oz (97.3 kg)    Physical Exam  Constitutional: He is oriented to person, place, and time.  HENT:  Right Ear: Tympanic membrane, external ear and ear canal normal.  Left Ear: Tympanic membrane, external ear and ear canal normal.  Nose: Mucosal edema and rhinorrhea present. Right sinus exhibits maxillary sinus tenderness and frontal sinus tenderness. Left sinus exhibits maxillary sinus tenderness and frontal sinus tenderness.  Mouth/Throat: Uvula is midline. Posterior oropharyngeal erythema present. Tonsils are 0 on the right. Tonsils are 0 on the left.  Cardiovascular: Normal rate.  Pulmonary/Chest: Effort normal. No respiratory distress.  He has wheezes. He has rales.  Musculoskeletal: He exhibits no edema.  Neurological: He is alert and oriented to person, place, and time.  Vitals reviewed.   Lab Results  Component Value Date   WBC 5.9 07/23/2017   HGB 15.9 07/23/2017   HCT 45.0 07/23/2017   PLT 149.0 (L) 07/23/2017   GLUCOSE 102 (H) 07/23/2017   CHOL 119 07/23/2017   TRIG 62.0 07/23/2017   HDL 53.30 07/23/2017   LDLCALC 53 07/23/2017   ALT 21 07/23/2017   AST 22 07/23/2017   NA 133 (L) 07/23/2017   K 4.6 07/23/2017   CL 97 07/23/2017   CREATININE 0.93 07/23/2017   BUN 13 07/23/2017   CO2 31 07/23/2017   TSH 1.43 03/02/2017   INR 1.11 03/03/2017    Vas [place] Lower Extremity Venous  (dvt)  Result Date: 04/27/2017  Lower Venous DVT Study Other Factors: Patient is s/p left TKA with interruption in anticoagulation 3 weeks due to bleeding complications who was admitted to the hospital on 03/03/17 for evaluation of a 2-3 day history of exertional dyspnea associated with palpitations. He was  found to have bilateral pulmonary emboli by CT on 03/03/2017. He is here today with complaints of left calf pain and swelling x 2 weeks. He denies any unusual SOB. Anticoagulation: Xarelto.  Examination Guidelines: A complete evaluation includes B-mode imaging, spectral doppler, color doppler, and power doppler as needed of all accessible portions of each vessel. Bilateral testing is considered an integral part of a complete examination. Limited examinations for reoccurring indications may be performed as noted. The reflux portion of the exam is performed with the patient in reverse Trendelenburg.  Right Venous Findings: +---+---------------+---------+-----------+----------+-------+    CompressibilityPhasicitySpontaneityPropertiesSummary +---+---------------+---------+-----------+----------+-------+ CFVFull           Yes      Yes                          +---+---------------+---------+-----------+----------+-------+  Left Venous Findings: +---------+---------------+---------+-----------+----------+-------+          CompressibilityPhasicitySpontaneityPropertiesSummary +---------+---------------+---------+-----------+----------+-------+ CFV      Full           Yes      Yes                          +---------+---------------+---------+-----------+----------+-------+ FV Prox  Full           Yes      Yes                          +---------+---------------+---------+-----------+----------+-------+ FV Mid   Full           Yes      Yes                          +---------+---------------+---------+-----------+----------+-------+ FV DistalFull           Yes      Yes                           +---------+---------------+---------+-----------+----------+-------+ PFV      Full           Yes      Yes                          +---------+---------------+---------+-----------+----------+-------+ POP  Full           Yes      Yes                          +---------+---------------+---------+-----------+----------+-------+ PTV      Full           Yes      Yes                          +---------+---------------+---------+-----------+----------+-------+ PERO     Full           Yes      Yes                          +---------+---------------+---------+-----------+----------+-------+ Gastroc  Full                                                 +---------+---------------+---------+-----------+----------+-------+ GSV      Full           Yes      Yes                          +---------+---------------+---------+-----------+----------+-------+  Left Technical Findings:  Area of pain in the lateral mid left calf. Cystic structure noted, measuring 2.2 x 1.0 x 1.6 cm. Findings reported to [place] at 2:09 pm.  Final Interpretation: Right: No evidence of common femoral vein obstruction. Left: No evidence of deep vein thrombosis in the lower extremity. No indirect evidence of obstruction proximal to the inguinal ligament. Cystic structure in the lateral mid left calf, measuring 2.2 x 1.0 x 1.6 cm.  *See table(s) above for measurements and observations. Electronically signed by Ida Rogue on 04/27/2017 at 5:20:34 PM.  Final   Assessment & Plan:   Javed was seen today for generalized body aches.  Diagnoses and all orders for this visit:  Acute nasopharyngitis -     DG Chest 2 View -     benzonatate (TESSALON) 100 MG capsule; Take 1 capsule (100 mg total) by mouth 3 (three) times daily as needed for cough. -     dextromethorphan-guaiFENesin (MUCINEX DM) 30-600 MG 12hr tablet; Take 1 tablet by mouth 2 (two) times daily as needed for cough. -      fluticasone (FLONASE) 50 MCG/ACT nasal spray; Place 2 sprays into both nostrils daily. -     sodium chloride (OCEAN) 0.65 % SOLN nasal spray; Place 1 spray into both nostrils as needed for congestion.  Community acquired pneumonia, unspecified laterality -     POC Influenza A&B (Binax test) -     DG Chest 2 View -     levofloxacin (LEVAQUIN) 500 MG tablet; Take 1 tablet (500 mg total) by mouth daily.  Chronic obstructive pulmonary disease with acute exacerbation (HCC) -     DG Chest 2 View -     methylPREDNISolone acetate (DEPO-MEDROL) injection 40 mg -     albuterol (PROVENTIL HFA;VENTOLIN HFA) 108 (90 Base) MCG/ACT inhaler; Inhale 1-2 puffs into the lungs every 6 (six) hours as needed. -     levofloxacin (LEVAQUIN) 500 MG tablet; Take 1 tablet (500 mg total) by mouth daily.   I am having Belenda Cruise A. Tamala Julian "Marya Amsler" start on  benzonatate, dextromethorphan-guaiFENesin, albuterol, fluticasone, sodium chloride, and levofloxacin. I am also having him maintain his omeprazole, LYSINE PO, Ascorbic Acid (VITAMIN C PO), Vitamin D3, ezetimibe, Naproxen Sodium, metoprolol succinate, furosemide, umeclidinium-vilanterol, atorvastatin, XARELTO, potassium chloride, and losartan. We administered methylPREDNISolone acetate.  Meds ordered this encounter  Medications  . benzonatate (TESSALON) 100 MG capsule    Sig: Take 1 capsule (100 mg total) by mouth 3 (three) times daily as needed for cough.    Dispense:  20 capsule    Refill:  0    Order Specific Question:   Supervising Provider    Answer:   MATTHEWS, CODY [[id number]]  . dextromethorphan-guaiFENesin (MUCINEX DM) 30-600 MG 12hr tablet    Sig: Take 1 tablet by mouth 2 (two) times daily as needed for cough.    Dispense:  14 tablet    Refill:  0    Order Specific Question:   Supervising Provider    Answer:   MATTHEWS, CODY [[id number]]  . methylPREDNISolone acetate (DEPO-MEDROL) injection 40 mg  . albuterol (PROVENTIL HFA;VENTOLIN HFA) 108 (90 Base) MCG/ACT inhaler      Sig: Inhale 1-2 puffs into the lungs every 6 (six) hours as needed.    Dispense:  1 Inhaler    Refill:  0    Order Specific Question:   Supervising Provider    Answer:   MATTHEWS, CODY [[id number]]  . fluticasone (FLONASE) 50 MCG/ACT nasal spray    Sig: Place 2 sprays into both nostrils daily.    Dispense:  16 g    Refill:  0    Order Specific Question:   Supervising Provider    Answer:   MATTHEWS, CODY [[id number]]  . sodium chloride (OCEAN) 0.65 % SOLN nasal spray    Sig: Place 1 spray into both nostrils as needed for congestion.    Dispense:  15 mL    Refill:  0    Order Specific Question:   Supervising Provider    Answer:   MATTHEWS, CODY [[id number]]  . levofloxacin (LEVAQUIN) 500 MG tablet    Sig: Take 1 tablet (500 mg total) by mouth daily.    Dispense:  7 tablet    Refill:  0    Order Specific Question:   Supervising Provider    Answer:   MATTHEWS, CODY [[id number]]    Problem List Items Addressed This Visit      Respiratory   COPD mixed type ([place])   Relevant Medications   benzonatate (TESSALON) 100 MG capsule   dextromethorphan-guaiFENesin (MUCINEX DM) 30-600 MG 12hr tablet   methylPREDNISolone acetate (DEPO-MEDROL) injection 40 mg (Completed)   albuterol (PROVENTIL HFA;VENTOLIN HFA) 108 (90 Base) MCG/ACT inhaler   fluticasone (FLONASE) 50 MCG/ACT nasal spray   sodium chloride (OCEAN) 0.65 % SOLN nasal spray   levofloxacin (LEVAQUIN) 500 MG tablet    Other Visit Diagnoses    Acute nasopharyngitis    -  Primary   Relevant Medications   benzonatate (TESSALON) 100 MG capsule   dextromethorphan-guaiFENesin (MUCINEX DM) 30-600 MG 12hr tablet   fluticasone (FLONASE) 50 MCG/ACT nasal spray   sodium chloride (OCEAN) 0.65 % SOLN nasal spray   Other Relevant Orders   DG Chest 2 View (Completed)   Community acquired pneumonia, unspecified laterality       Relevant Medications   benzonatate (TESSALON) 100 MG capsule   dextromethorphan-guaiFENesin (MUCINEX DM) 30-600 MG 12hr tablet    albuterol (PROVENTIL HFA;VENTOLIN HFA) 108 (90 Base) MCG/ACT inhaler   fluticasone (FLONASE) 50 MCG/ACT nasal spray  sodium chloride (OCEAN) 0.65 % SOLN nasal spray   levofloxacin (LEVAQUIN) 500 MG tablet   Other Relevant Orders   POC Influenza A&B (Binax test) (Completed)   DG Chest 2 View (Completed)       Follow-up: Return if symptoms worsen or fail to improve.  Wilfred Lacy, NP

## 2017-12-29 NOTE — Telephone Encounter (Signed)
Returned call to patient who had questions about pneumonia. He wanted to know how contagious he is and is he a danger to other family members. Pt was advised that he should be fever free and have taken antibiotic for at least 48 hours. He as instructed to rest and please call back if symptoms do not improve.  Reason for Disposition . General information question, no triage required and triager able to answer question  Answer Assessment - Initial Assessment Questions 1. REASON FOR CALL or QUESTION: "What is your reason for calling today?" or "How can I best help you?" or "What question do you have that I can help answer?"     How contagious am I  Protocols used: INFORMATION ONLY CALL-A-AH

## 2018-01-10 ENCOUNTER — Other Ambulatory Visit: Payer: Self-pay | Admitting: Family Medicine

## 2018-01-10 DIAGNOSIS — I480 Paroxysmal atrial fibrillation: Secondary | ICD-10-CM

## 2018-01-10 DIAGNOSIS — I10 Essential (primary) hypertension: Secondary | ICD-10-CM

## 2018-01-10 DIAGNOSIS — E7800 Pure hypercholesterolemia, unspecified: Secondary | ICD-10-CM

## 2018-01-12 ENCOUNTER — Other Ambulatory Visit (INDEPENDENT_AMBULATORY_CARE_PROVIDER_SITE_OTHER): Payer: Medicare Other

## 2018-01-12 DIAGNOSIS — E7800 Pure hypercholesterolemia, unspecified: Secondary | ICD-10-CM | POA: Diagnosis not present

## 2018-01-12 DIAGNOSIS — I480 Paroxysmal atrial fibrillation: Secondary | ICD-10-CM

## 2018-01-12 LAB — CBC WITH DIFFERENTIAL/PLATELET
Basophils Absolute: 0 10*3/uL (ref 0.0–0.1)
Basophils Relative: 0.4 % (ref 0.0–3.0)
Eosinophils Absolute: 0.1 10*3/uL (ref 0.0–0.7)
Eosinophils Relative: 1.9 % (ref 0.0–5.0)
HCT: 44.8 % (ref 39.0–52.0)
Hemoglobin: 15.3 g/dL (ref 13.0–17.0)
Lymphocytes Relative: 33.4 % (ref 12.0–46.0)
Lymphs Abs: 2.4 10*3/uL (ref 0.7–4.0)
MCHC: 34.2 g/dL (ref 30.0–36.0)
MCV: 95.6 fl (ref 78.0–100.0)
Monocytes Absolute: 0.7 10*3/uL (ref 0.1–1.0)
Monocytes Relative: 9.2 % (ref 3.0–12.0)
NEUTROS ABS: 3.9 10*3/uL (ref 1.4–7.7)
NEUTROS PCT: 55.1 % (ref 43.0–77.0)
PLATELETS: 178 10*3/uL (ref 150.0–400.0)
RBC: 4.68 Mil/uL (ref 4.22–5.81)
RDW: 13.5 % (ref 11.5–15.5)
WBC: 7.1 10*3/uL (ref 4.0–10.5)

## 2018-01-12 LAB — COMPREHENSIVE METABOLIC PANEL
ALT: 19 U/L (ref 0–53)
AST: 19 U/L (ref 0–37)
Albumin: 4.1 g/dL (ref 3.5–5.2)
Alkaline Phosphatase: 63 U/L (ref 39–117)
BUN: 21 mg/dL (ref 6–23)
CO2: 30 meq/L (ref 19–32)
CREATININE: 1.03 mg/dL (ref 0.40–1.50)
Calcium: 9.4 mg/dL (ref 8.4–10.5)
Chloride: 99 mEq/L (ref 96–112)
GFR: 73.61 mL/min (ref >60.00)
GLUCOSE: 98 mg/dL (ref 70–99)
Potassium: 4.6 mEq/L (ref 3.5–5.1)
Sodium: 135 mEq/L (ref 135–145)
Total Bilirubin: 0.8 mg/dL (ref 0.2–1.2)
Total Protein: 7 g/dL (ref 6.0–8.3)

## 2018-01-12 LAB — LIPID PANEL
CHOL/HDL RATIO: 3
Cholesterol: 121 mg/dL (ref 0–200)
HDL: 44.3 mg/dL (ref >39.00)
LDL Cholesterol: 61 mg/dL (ref 0–99)
NONHDL: 77.05
Triglycerides: 80 mg/dL (ref 0.0–149.0)
VLDL: 16 mg/dL (ref 0.0–40.0)

## 2018-01-18 ENCOUNTER — Encounter: Payer: Self-pay | Admitting: Family Medicine

## 2018-01-18 NOTE — Assessment & Plan Note (Signed)
Preventative protocols reviewed and updated unless pt declined. Discussed healthy diet and lifestyle.  

## 2018-01-18 NOTE — Progress Notes (Signed)
BP 122/70 (BP Location: Left Arm, Patient Position: Sitting, Cuff Size: Normal)   Pulse 71   Temp 98.2 F (36.8 C) (Oral)   Ht 5\' 10"  (1.778 m)   Wt 219 lb (99.3 kg)   SpO2 99%   BMI 31.42 kg/m    CC: CPE Subjective:    Patient ID: Theodore [name], male    DOB: August 26, 1934, 83 y.o.   MRN: [id number]  HPI: Theodore [name] is a 83 y.o. male presenting on 01/19/2018 for Annual Exam (Pt 2.) and Pneumonia (Here for 3 wk f/u for pnuemonia. Pt provided copy of cxr and wants to discuss some findings in it. )   Saw Katha Cabal 07/2017 for medicare wellness visit. Note reviewed.   Seen 3 wks ago by Wilfred Lacy for acute pneumonia by CXR (patchy interstitial lingular pneumonia) - treated with levaquin course - and possible L lung base nodule, due for rpt CXR to document clearing. CXR also noted thoracic aortic atherosclerosis. Flu swab was negative.  To see cardiology later this week.   Preventative: COLONOSCOPY 2013 per patient, rpt 5 yrs([facility]) - seen GI 07/2016. Normal iFOB 09/2017.  Prostate cancer screening -always normal saw urology. Age out Lung cancer screening -non smoker Flu shotyearly Tetanus shotunsure  Pneumovax 2011, prevnar 2016 zostavax -~2014 shingrix - discussed Advanced directive - Advanced directives: scanned and in chart 07/2016. Wants wife Mitch Arquette to be HCPOA then children. Grants HCPOA discretion for medical decisions Seat belt use discussed  Sunscreen use discussed. No changing moles on skin. Sees derm.  Non smoker  Alcohol - 1-2 beers/day  Dentist q6 mo  Eye exam yearly   Lives with wife, no pets Retired Occ: Art gallery manager Edu: master's Activity: golfand walking- knee pain limits his Diet: good water, fruits/vegetables daily   Relevant past medical, surgical, family and social history reviewed and updated as indicated. Interim medical history since our last visit reviewed. Allergies and medications reviewed and updated. Outpatient  Medications Prior to Visit  Medication Sig Dispense Refill  . albuterol (PROVENTIL HFA;VENTOLIN HFA) 108 (90 Base) MCG/ACT inhaler Inhale 1-2 puffs into the lungs every 6 (six) hours as needed. 1 Inhaler 0  . Ascorbic Acid (VITAMIN C PO) Take 1 tablet by mouth daily.     [name] atorvastatin (LIPITOR) 40 MG tablet TAKE 1 TABLET DAILY 90 tablet 3  . Cholecalciferol (VITAMIN D3) 2000 units TABS Take 2,000 Units by mouth daily.    [name] ezetimibe (ZETIA) 10 MG tablet TAKE 1 TABLET DAILY 90 tablet 3  . furosemide (LASIX) 40 MG tablet Take 1 tablet (40 mg total) by mouth daily. 90 tablet 3  . losartan (COZAAR) 100 MG tablet Take 100 mg by mouth once daily 90 tablet 3  . LYSINE PO Take 1 tablet by mouth daily.     . metoprolol succinate (TOPROL-XL) 100 MG 24 hr tablet Take 1 tablet (100 mg total) by mouth 2 (two) times daily. Take with or immediately following a meal. 180 tablet 3  . omeprazole (PRILOSEC) 20 MG capsule Take 20 mg by mouth daily.      . potassium chloride (K-DUR,KLOR-CON) 10 MEQ tablet TAKE 1 TABLET TWICE A DAY 180 tablet 4  . umeclidinium-vilanterol (ANORO ELLIPTA) 62.5-25 MCG/INH AEPB Inhale 1 puff into the lungs daily. 180 each 1  . XARELTO 20 MG TABS tablet TAKE 1 TABLET DAILY WITH SUPPER 90 tablet 1  . Naproxen Sodium (ALEVE) 220 MG CAPS Take 1 capsule by mouth as needed.    [name]  benzonatate (TESSALON) 100 MG capsule Take 1 capsule (100 mg total) by mouth 3 (three) times daily as needed for cough. 20 capsule 0  . dextromethorphan-guaiFENesin (MUCINEX DM) 30-600 MG 12hr tablet Take 1 tablet by mouth 2 (two) times daily as needed for cough. 14 tablet 0  . fluticasone (FLONASE) 50 MCG/ACT nasal spray Place 2 sprays into both nostrils daily. 16 g 0  . levofloxacin (LEVAQUIN) 500 MG tablet Take 1 tablet (500 mg total) by mouth daily. 7 tablet 0  . sodium chloride (OCEAN) 0.65 % SOLN nasal spray Place 1 spray into both nostrils as needed for congestion. 15 mL 0   No facility-administered  medications prior to visit.      Per HPI unless specifically indicated in ROS section below Review of Systems  Constitutional: Positive for fever. Negative for activity change, appetite change, chills, fatigue and unexpected weight change.  HENT: Negative for hearing loss.   Eyes: Negative for visual disturbance.  Respiratory: Positive for cough (recent PNA). Negative for chest tightness, shortness of breath and wheezing.   Cardiovascular: Negative for chest pain, palpitations and leg swelling.  Gastrointestinal: Negative for abdominal distention, abdominal pain, blood in stool, constipation, diarrhea, nausea and vomiting.  Genitourinary: Negative for difficulty urinating and hematuria.  Musculoskeletal: Positive for back pain (recent PNA). Negative for arthralgias, myalgias and neck pain.  Skin: Negative for rash.  Neurological: Negative for dizziness, seizures, syncope and headaches.  Hematological: Negative for adenopathy. Does not bruise/bleed easily.  Psychiatric/Behavioral: Negative for dysphoric mood. The patient is not nervous/anxious.        Objective:    BP 122/70 (BP Location: Left Arm, Patient Position: Sitting, Cuff Size: Normal)   Pulse 71   Temp 98.2 F (36.8 C) (Oral)   Ht 5\' 10"  (1.778 m)   Wt 219 lb (99.3 kg)   SpO2 99%   BMI 31.42 kg/m   Wt Readings from Last 3 Encounters:  01/19/18 219 lb (99.3 kg)  12/29/17 220 lb 12.8 oz (100.2 kg)  10/08/17 219 lb 9.6 oz (99.6 kg)    Physical Exam  Constitutional: He is oriented to person, place, and time. He appears well-developed and well-nourished. No distress.  HENT:  Head: Normocephalic and atraumatic.  Right Ear: Hearing, tympanic membrane, external ear and ear canal normal.  Left Ear: Hearing, tympanic membrane, external ear and ear canal normal.  Nose: Nose normal.  Mouth/Throat: Uvula is midline, oropharynx is clear and moist and mucous membranes are normal. No oropharyngeal exudate, posterior oropharyngeal  edema or posterior oropharyngeal erythema.  Eyes: Pupils are equal, round, and reactive to light. Conjunctivae and EOM are normal. No scleral icterus.  Neck: Normal range of motion. Neck supple. No thyromegaly present.  Cardiovascular: Normal rate, regular rhythm, normal heart sounds and intact distal pulses.  No murmur heard. Pulses:      Radial pulses are 2+ on the right side, and 2+ on the left side.  Pulmonary/Chest: Effort normal and breath sounds normal. No respiratory distress. He has no wheezes. He has no rales.  Abdominal: Soft. Bowel sounds are normal. He exhibits no distension and no mass. There is no tenderness. There is no rebound and no guarding.  Musculoskeletal: Normal range of motion. He exhibits no edema.  Lymphadenopathy:    He has no cervical adenopathy.  Neurological: He is alert and oriented to person, place, and time.  CN grossly intact, station and gait intact  Skin: Skin is warm and dry. No rash noted.  Psychiatric: He has  a normal mood and affect. His behavior is normal. Judgment and thought content normal.  Nursing note and vitals reviewed.  Results for orders placed or performed in visit on 01/12/18  Comprehensive metabolic panel  Result Value Ref Range   Sodium 135 135 - 145 mEq/L   Potassium 4.6 3.5 - 5.1 mEq/L   Chloride 99 96 - 112 mEq/L   CO2 30 19 - 32 mEq/L   Glucose, Bld 98 70 - 99 mg/dL   BUN 21 6 - 23 mg/dL   Creatinine, Ser 1.03 0.40 - 1.50 mg/dL   Total Bilirubin 0.8 0.2 - 1.2 mg/dL   Alkaline Phosphatase 63 39 - 117 U/L   AST 19 0 - 37 U/L   ALT 19 0 - 53 U/L   Total Protein 7.0 6.0 - 8.3 g/dL   Albumin 4.1 3.5 - 5.2 g/dL   Calcium 9.4 8.4 - 10.5 mg/dL   GFR 73.61 >60.00 mL/min  Lipid panel  Result Value Ref Range   Cholesterol 121 0 - 200 mg/dL   Triglycerides 80.0 0.0 - 149.0 mg/dL   HDL 44.30 >39.00 mg/dL   VLDL 16.0 0.0 - 40.0 mg/dL   LDL Cholesterol 61 0 - 99 mg/dL   Total CHOL/HDL Ratio 3    NonHDL 77.05   CBC with  Differential/Platelet  Result Value Ref Range   WBC 7.1 4.0 - 10.5 K/uL   RBC 4.68 4.22 - 5.81 Mil/uL   Hemoglobin 15.3 13.0 - 17.0 g/dL   HCT 44.8 39.0 - 52.0 %   MCV 95.6 78.0 - 100.0 fl   MCHC 34.2 30.0 - 36.0 g/dL   RDW 13.5 11.5 - 15.5 %   Platelets 178.0 150.0 - 400.0 K/uL   Neutrophils Relative % 55.1 43.0 - 77.0 %   Lymphocytes Relative 33.4 12.0 - 46.0 %   Monocytes Relative 9.2 3.0 - 12.0 %   Eosinophils Relative 1.9 0.0 - 5.0 %   Basophils Relative 0.4 0.0 - 3.0 %   Neutro Abs 3.9 1.4 - 7.7 K/uL   Lymphs Abs 2.4 0.7 - 4.0 K/uL   Monocytes Absolute 0.7 0.1 - 1.0 K/uL   Eosinophils Absolute 0.1 0.0 - 0.7 K/uL   Basophils Absolute 0.0 0.0 - 0.1 K/uL      Assessment & Plan:   Problem List Items Addressed This Visit    Thoracic aorta atherosclerosis ([place])    By CXR, however not noted on CTA 02/2017. Continue statin.       Paroxysmal atrial fibrillation (HCC) (Chronic)    S/p ablation. Continues xarelto.       Osteoarthritis   Relevant Medications   acetaminophen (TYLENOL) 500 MG tablet   Obesity, Class I, BMI 30.0-34.9 (see actual BMI)    Encouraged healthy diet and lifestyle changes to affect sustainable weight loss. Obesity contributes to comorbidities including HTN, HLD, CAD, and arthritis.       Nonischemic cardiomyopathy (HCC)   HLD (hyperlipidemia)    Chronic, stable. Continue zetia and lipitor.  The ASCVD Risk score Mikey Bussing DC Jr., et al., 2013) failed to calculate for the following reasons:   The 2013 ASCVD risk score is only valid for ages 37 to 20       Health maintenance examination - Primary    Preventative protocols reviewed and updated unless pt declined. Discussed healthy diet and lifestyle.       Essential hypertension (Chronic)    Chronic, stable. Continue current regimen.       COPD mixed  type ([place])    Continue anoro ellipta with albuterol PRN>       CAP (community acquired pneumonia)    Update CXR with nipple markers today.        Relevant Orders   DG Chest 2 View   Advanced care planning/counseling discussion    Advanced directive - Advanced directives: scanned and in chart 07/2016. Wants wife Montrez Marietta to be HCPOA then children. Grants HCPOA discretion for medical decisions          Meds ordered this encounter  Medications  . acetaminophen (TYLENOL) 500 MG tablet    Sig: Take 1 tablet (500 mg total) by mouth 3 (three) times daily as needed.    Dispense:  30 tablet    Refill:  0   Orders Placed This Encounter  Procedures  . DG Chest 2 View    Standing Status:   Future    Number of Occurrences:   1    Standing Expiration Date:   03/23/2019    Order Specific Question:   Reason for Exam (SYMPTOM  OR DIAGNOSIS REQUIRED)    Answer:   f/u CAP    Order Specific Question:   Preferred imaging location?    Answer:   [facility]    Order Specific Question:   Radiology Contrast Protocol - do NOT remove file path    Answer:   [email or web address]    Follow up plan: Return in about 6 months (around 07/21/2018) for follow up visit.  [name], MD

## 2018-01-19 ENCOUNTER — Ambulatory Visit (INDEPENDENT_AMBULATORY_CARE_PROVIDER_SITE_OTHER)
Admission: RE | Admit: 2018-01-19 | Discharge: 2018-01-19 | Disposition: A | Discharge status: Home / Self Care | Payer: Medicare Other | Source: Ambulatory Visit | Attending: Family Medicine | Admitting: Family Medicine

## 2018-01-19 ENCOUNTER — Encounter: Payer: Self-pay | Admitting: Family Medicine

## 2018-01-19 ENCOUNTER — Other Ambulatory Visit: Payer: Self-pay | Admitting: Nurse Practitioner

## 2018-01-19 ENCOUNTER — Ambulatory Visit (INDEPENDENT_AMBULATORY_CARE_PROVIDER_SITE_OTHER): Payer: Medicare Other | Admitting: Family Medicine

## 2018-01-19 VITALS — BP 122/70 | HR 71 | Temp 98.2°F | Ht 70.0 in | Wt 219.0 lb

## 2018-01-19 DIAGNOSIS — Z0000 Encounter for general adult medical examination without abnormal findings: Secondary | ICD-10-CM | POA: Diagnosis not present

## 2018-01-19 DIAGNOSIS — J189 Pneumonia, unspecified organism: Secondary | ICD-10-CM

## 2018-01-19 DIAGNOSIS — I480 Paroxysmal atrial fibrillation: Secondary | ICD-10-CM

## 2018-01-19 DIAGNOSIS — E7800 Pure hypercholesterolemia, unspecified: Secondary | ICD-10-CM

## 2018-01-19 DIAGNOSIS — I10 Essential (primary) hypertension: Secondary | ICD-10-CM

## 2018-01-19 DIAGNOSIS — J181 Lobar pneumonia, unspecified organism: Secondary | ICD-10-CM

## 2018-01-19 DIAGNOSIS — I700 Atherosclerosis of aorta: Secondary | ICD-10-CM

## 2018-01-19 DIAGNOSIS — E66811 Obesity, class 1: Secondary | ICD-10-CM

## 2018-01-19 DIAGNOSIS — Z7189 Other specified counseling: Secondary | ICD-10-CM | POA: Diagnosis not present

## 2018-01-19 DIAGNOSIS — E669 Obesity, unspecified: Secondary | ICD-10-CM

## 2018-01-19 DIAGNOSIS — M170 Bilateral primary osteoarthritis of knee: Secondary | ICD-10-CM

## 2018-01-19 DIAGNOSIS — I428 Other cardiomyopathies: Secondary | ICD-10-CM

## 2018-01-19 DIAGNOSIS — J00 Acute nasopharyngitis [common cold]: Secondary | ICD-10-CM

## 2018-01-19 DIAGNOSIS — J449 Chronic obstructive pulmonary disease, unspecified: Secondary | ICD-10-CM

## 2018-01-19 HISTORY — DX: Pneumonia, unspecified organism: J18.9

## 2018-01-19 MED ORDER — ACETAMINOPHEN 500 MG PO TABS: 500.0000 mg | ORAL_TABLET | Freq: Three times a day (TID) | ORAL | 0 refills | Status: AC | PRN

## 2018-01-19 NOTE — Assessment & Plan Note (Signed)
Continue anoro ellipta with albuterol PRN>

## 2018-01-19 NOTE — Assessment & Plan Note (Signed)
Chronic, stable. Continue zetia and lipitor.  The ASCVD Risk score Mikey Bussing DC Jr., et al., 2013) failed to calculate for the following reasons:   The 2013 ASCVD risk score is only valid for ages 47 to 74

## 2018-01-19 NOTE — Assessment & Plan Note (Signed)
Advanced directive - Advanced directives: scanned and in chart 07/2016. Wants wife Edan Juday to be HCPOA then children. Grants HCPOA discretion for medical decisions

## 2018-01-19 NOTE — Assessment & Plan Note (Signed)
S/p ablation. Continues xarelto.

## 2018-01-19 NOTE — Assessment & Plan Note (Signed)
By CXR, however not noted on CTA 02/2017. Continue statin.

## 2018-01-19 NOTE — Assessment & Plan Note (Signed)
Encouraged healthy diet and lifestyle changes to affect sustainable weight loss. Obesity contributes to comorbidities including HTN, HLD, CAD, and arthritis.

## 2018-01-19 NOTE — Assessment & Plan Note (Signed)
Chronic, stable. Continue current regimen. 

## 2018-01-19 NOTE — Patient Instructions (Addendum)
Repeat chest xray today.  If interested, check with pharmacy about new 2 shot shingles series (shingrix).  Recommend against taking advil. Tylenol is ok.  You are doing well today. Continue current medicines.  Return as needed or in 6 months for follow up visit.   Health Maintenance, Male A healthy lifestyle and preventive care is important for your health and wellness. Ask your health care provider about what schedule of regular examinations is right for you. What should I know about weight and diet? Eat a Healthy Diet  Eat plenty of vegetables, fruits, whole grains, low-fat dairy products, and lean protein.  Do not eat a lot of foods high in solid fats, added sugars, or salt.  Maintain a Healthy Weight Regular exercise can help you achieve or maintain a healthy weight. You should:  Do at least 150 minutes of exercise each week. The exercise should increase your heart rate and make you sweat (moderate-intensity exercise).  Do strength-training exercises at least twice a week.  Watch Your Levels of Cholesterol and Blood Lipids  Have your blood tested for lipids and cholesterol every 5 years starting at 81 years of age. If you are at high risk for heart disease, you should start having your blood tested when you are 81 years old. You may need to have your cholesterol levels checked more often if: ? Your lipid or cholesterol levels are high. ? You are older than 81 years of age. ? You are at high risk for heart disease.  What should I know about cancer screening? Many types of cancers can be detected early and may often be prevented. Lung Cancer  You should be screened every year for lung cancer if: ? You are a current smoker who has smoked for at least 30 years. ? You are a former smoker who has quit within the past 15 years.  Talk to your health care provider about your screening options, when you should start screening, and how often you should be screened.  Colorectal  Cancer  Routine colorectal cancer screening usually begins at 81 years of age and should be repeated every 5-10 years until you are 81 years old. You may need to be screened more often if early forms of precancerous polyps or small growths are found. Your health care provider may recommend screening at an earlier age if you have risk factors for colon cancer.  Your health care provider may recommend using home test kits to check for hidden blood in the stool.  A small camera at the end of a tube can be used to examine your colon (sigmoidoscopy or colonoscopy). This checks for the earliest forms of colorectal cancer.  Prostate and Testicular Cancer  Depending on your age and overall health, your health care provider may do certain tests to screen for prostate and testicular cancer.  Talk to your health care provider about any symptoms or concerns you have about testicular or prostate cancer.  Skin Cancer  Check your skin from head to toe regularly.  Tell your health care provider about any new moles or changes in moles, especially if: ? There is a change in a mole's size, shape, or color. ? You have a mole that is larger than a pencil eraser.  Always use sunscreen. Apply sunscreen liberally and repeat throughout the day.  Protect yourself by wearing long sleeves, pants, a wide-brimmed hat, and sunglasses when outside.  What should I know about heart disease, diabetes, and high blood pressure?  If you  are 13-51 years of age, have your blood pressure checked every 3-5 years. If you are 79 years of age or older, have your blood pressure checked every year. You should have your blood pressure measured twice-once when you are at a hospital or clinic, and once when you are not at a hospital or clinic. Record the average of the two measurements. To check your blood pressure when you are not at a hospital or clinic, you can use: ? An automated blood pressure machine at a pharmacy. ? A home blood  pressure monitor.  Talk to your health care provider about your target blood pressure.  If you are between 73-46 years old, ask your health care provider if you should take aspirin to prevent heart disease.  Have regular diabetes screenings by checking your fasting blood sugar level. ? If you are at a normal weight and have a low risk for diabetes, have this test once every three years after the age of 59. ? If you are overweight and have a high risk for diabetes, consider being tested at a younger age or more often.  A one-time screening for abdominal aortic aneurysm (AAA) by ultrasound is recommended for men aged 9-75 years who are current or former smokers. What should I know about preventing infection? Hepatitis B If you have a higher risk for hepatitis B, you should be screened for this virus. Talk with your health care provider to find out if you are at risk for hepatitis B infection. Hepatitis C Blood testing is recommended for:  Everyone born from [birth year] through 1963.  Anyone with known risk factors for hepatitis C.  Sexually Transmitted Diseases (STDs)  You should be screened each year for STDs including gonorrhea and chlamydia if: ? You are sexually active and are younger than 81 years of age. ? You are older than 81 years of age and your health care provider tells you that you are at risk for this type of infection. ? Your sexual activity has changed since you were last screened and you are at an increased risk for chlamydia or gonorrhea. Ask your health care provider if you are at risk.  Talk with your health care provider about whether you are at high risk of being infected with HIV. Your health care provider may recommend a prescription medicine to help prevent HIV infection.  What else can I do?  Schedule regular health, dental, and eye exams.  Stay current with your vaccines (immunizations).  Do not use any tobacco products, such as cigarettes, chewing tobacco, and  e-cigarettes. If you need help quitting, ask your health care provider.  Limit alcohol intake to no more than 2 drinks per day. One drink equals 12 ounces of beer, 5 ounces of wine, or 1 ounces of hard liquor.  Do not use street drugs.  Do not share needles.  Ask your health care provider for help if you need support or information about quitting drugs.  Tell your health care provider if you often feel depressed.  Tell your health care provider if you have ever been abused or do not feel safe at home. This information is not intended to replace advice given to you by your health care provider. Make sure you discuss any questions you have with your health care provider. Document Released: 07/26/2007 Document Revised: 09/26/2015 Document Reviewed: 10/31/2014 Elsevier Interactive Patient Education  [employer].

## 2018-01-19 NOTE — Assessment & Plan Note (Signed)
Update CXR with nipple markers today.

## 2018-01-22 ENCOUNTER — Encounter: Payer: Self-pay | Admitting: Cardiology

## 2018-01-22 ENCOUNTER — Ambulatory Visit (INDEPENDENT_AMBULATORY_CARE_PROVIDER_SITE_OTHER): Payer: Medicare Other | Admitting: Cardiology

## 2018-01-22 VITALS — BP 116/76 | HR 63 | Ht 71.0 in | Wt 217.2 lb

## 2018-01-22 DIAGNOSIS — E785 Hyperlipidemia, unspecified: Secondary | ICD-10-CM

## 2018-01-22 DIAGNOSIS — I5022 Chronic systolic (congestive) heart failure: Secondary | ICD-10-CM

## 2018-01-22 DIAGNOSIS — I10 Essential (primary) hypertension: Secondary | ICD-10-CM

## 2018-01-22 DIAGNOSIS — I4819 Other persistent atrial fibrillation: Secondary | ICD-10-CM

## 2018-01-22 DIAGNOSIS — I2510 Atherosclerotic heart disease of native coronary artery without angina pectoris: Secondary | ICD-10-CM | POA: Diagnosis not present

## 2018-01-22 DIAGNOSIS — I480 Paroxysmal atrial fibrillation: Secondary | ICD-10-CM | POA: Diagnosis not present

## 2018-01-22 DIAGNOSIS — I77819 Aortic ectasia, unspecified site: Secondary | ICD-10-CM

## 2018-01-22 NOTE — Patient Instructions (Addendum)
Medication Instructions:  Your physician recommends that you continue on your current medications as directed. Please refer to the Current Medication list given to you today.  * If you need a refill on your cardiac medications before your next appointment, please call your pharmacy.   Labwork: None ordered  Testing/Procedures: Your physician recommends that you have an abdominal ultrasound before you follow up with Dr. [name] in 6 months    Follow-Up: Your physician wants you to follow-up in: 6 months with Dr. [name].  You will receive a reminder letter in the mail two months in advance. If you don't receive a letter, please call our office to schedule the follow-up appointment.  Thank you for choosing [facility]!!   Trinidad Curet, RN [phone]

## 2018-01-22 NOTE — Progress Notes (Signed)
Electrophysiology Office Note   Date:  01/22/2018   ID:  ORMAND [name], DOB 11/27/1934, MRN [id number]  PCP:  Theodore Bush, MD  Cardiologist:  Theodore [name] Electrophysiologist:  Dr [name]    CC: Follow up for persistent atrial fibrillation   History of Present Illness: Theodore [name] is a 83 y.o. male who presents today for electrophysiology evaluation.   He has a history of hypertension, hyperlipidemia, persistent atrial fibrillation, asthma, and rheumatic fever. Mr. Shives was diagnosed with atrial fibrillation in 2014.  He was initially on Pradaxa and switched to Xarelto.  He had an episode of chest pain 08/2014 and had a Lexiscan Myoview 08/31/14 that showed an old inferior scar with peri-infarct ischemia and LVEF 49%.  He then had a LHC that revealed 70% ostial LAD, 50% prox LAD, 30% OM1 and 35% RCA.    Status post atrial fibrillation ablation on 12/19/15.  Today, denies symptoms of palpitations, chest pain, shortness of breath, orthopnea, PND, lower extremity edema, claudication, dizziness, presyncope, syncope, bleeding, or neurologic sequela. The patient is tolerating medications without difficulties. He reports that he has noted no episodes of atrial fibrillation. He is recovering from CAP and has completed his course of antibiotics.     Past Medical History:  Diagnosis Date  . History of arthritis   . History of rheumatic fever   . Hypercholesterolemia   . Hypertension   . Nocturia   . PAF (paroxysmal atrial fibrillation) [facility]) January 2013   Placed on Pradaxa. Did not require cardioversion; spontaneously converted  . Peripheral edema   . Right bundle branch block   . SOB (shortness of breath)   . Tendonitis of elbow, left    Past Surgical History:  Procedure Laterality Date  . ATRIAL FIBRILLATION ABLATION  12/19/2015  . BACK SURGERY  12/24/09   fusion C3-C4  . BACK SURGERY  2010   fusion L4-L5  . CARDIAC CATHETERIZATION  2009   NONOBSTRUCTIVE  ATHERSCLEROTIC CORONARY DISEASE AND NORMAL  LV FUNCTION  . CARDIAC CATHETERIZATION N/A 09/08/2014   Procedure: Left Heart Cath and Coronary Angiography;  Surgeon: Belva Crome, MD;  Location: [place] [facility];  Service: Cardiovascular;  Laterality: N/A;  . CARDIAC CATHETERIZATION N/A 09/28/2015   Procedure: Left Heart Cath and Coronary Angiography;  Surgeon: Leonie Man, MD;  Location: [place] [facility];  Service: Cardiovascular;  Laterality: N/A;  . CARDIAC CATHETERIZATION N/A 09/28/2015   Procedure: Intravascular Pressure Wire/FFR Study;  Surgeon: Leonie Man, MD;  Location: [place] [facility];  Service: Cardiovascular;  Laterality: N/A;  . CARDIOVERSION N/A 10/23/2015   Procedure: CARDIOVERSION;  Surgeon: [name], MD;  Location: [facility] ENDOSCOPY;  Service: Cardiovascular;  Laterality: N/A;  . COLONOSCOPY  2013   per patient, rpt 5 yrs  . ELECTROPHYSIOLOGIC STUDY N/A 12/19/2015   Procedure: Atrial Fibrillation Ablation;  Surgeon: Milley Vining Meredith Leeds, MD;  Location: [place] [facility];  Service: Cardiovascular;  Laterality: N/A;  . KNEE ARTHROSCOPY Left 03/2016   Dr. [name]  . PATELLAR TENDON REPAIR Left 2008  . REPLACEMENT TOTAL KNEE Right 2006  . TOTAL KNEE ARTHROPLASTY Left 02/09/2017   Procedure: LEFT TOTAL KNEE ARTHROPLASTY, EXCISION LEFT DISTAL THIGH MASS;  Surgeon: [name], MD;  Location: [facility];  Service: Orthopedics;  Laterality: Left;  90 mins  . TRICEPS TENDON REPAIR Left 2013     Current Outpatient Medications  Medication Sig Dispense Refill  . acetaminophen (TYLENOL) 500 MG tablet Take 1 tablet (500  mg total) by mouth 3 (three) times daily as needed. 30 tablet 0  . albuterol (PROVENTIL HFA;VENTOLIN HFA) 108 (90 Base) MCG/ACT inhaler Inhale 1-2 puffs into the lungs every 6 (six) hours as needed. 1 Inhaler 0  . Ascorbic Acid (VITAMIN C PO) Take 1 tablet by mouth daily.     [name] atorvastatin (LIPITOR) 40 MG tablet TAKE 1 TABLET DAILY 90 tablet 3  . Cholecalciferol  (VITAMIN D3) 2000 units TABS Take 2,000 Units by mouth daily.    [name] ezetimibe (ZETIA) 10 MG tablet TAKE 1 TABLET DAILY 90 tablet 3  . fluticasone (FLONASE) 50 MCG/ACT nasal spray SPRAY 2 SPRAYS INTO EACH NOSTRIL EVERY DAY 16 g 0  . furosemide (LASIX) 40 MG tablet Take 1 tablet (40 mg total) by mouth daily. 90 tablet 3  . losartan (COZAAR) 100 MG tablet Take 100 mg by mouth once daily 90 tablet 3  . LYSINE PO Take 1 tablet by mouth daily.     . metoprolol succinate (TOPROL-XL) 100 MG 24 hr tablet Take 1 tablet (100 mg total) by mouth 2 (two) times daily. Take with or immediately following a meal. 180 tablet 3  . omeprazole (PRILOSEC) 20 MG capsule Take 20 mg by mouth daily.      . potassium chloride (K-DUR,KLOR-CON) 10 MEQ tablet TAKE 1 TABLET TWICE A DAY 180 tablet 4  . umeclidinium-vilanterol (ANORO ELLIPTA) 62.5-25 MCG/INH AEPB Inhale 1 puff into the lungs daily. 180 each 1  . XARELTO 20 MG TABS tablet TAKE 1 TABLET DAILY WITH SUPPER 90 tablet 1   No current facility-administered medications for this visit.     Allergies:   Penicillins; Amlodipine; Lisinopril; Mevacor [lovastatin]; Vicodin [hydrocodone-acetaminophen]; Zocor [simvastatin]; and Oseltamivir phosphate   Social History:  The patient  reports that he has never smoked. He has never used smokeless tobacco. He reports current alcohol use of about 14.0 standard drinks of alcohol per week. He reports that he does not use drugs.   Family History:  The patient's family history includes Asthma in his sister; COPD in his brother and father; Heart attack in his father; Heart disease in his brother; Hypertension in his father.    ROS:  Please see the history of present illness.  All other systems are reviewed and negative.   PHYSICAL EXAM: VS:  BP 116/76   Pulse 63   Ht 5\' 11"  (1.803 m)   Wt 217 lb 3.2 oz (98.5 kg)   SpO2 98%   BMI 30.29 kg/m  , BMI Body mass index is 30.29 kg/m. GEN: Well nourished elderly male, well developed, in  no acute distress  HEENT: normal  Neck: no JVD, carotid bruits, or masses Cardiac: RRR; occasional ectopic beat noted, no murmurs, rubs, or gallops,no edema  Respiratory:  clear to auscultation bilaterally, normal work of breathing MS: no deformity or atrophy  Skin: warm and dry Neuro:  Strength and sensation are intact Psych: euthymic mood, full affect  EKG:  EKG is ordered today. Personal review of the ekg ordered shows sinus rhythm HR 63, PVCs, LAD, RBBB, PR 198, QRS 146, QTc 472    Recent Labs: 03/02/2017: Pro B Natriuretic peptide (BNP) 124.0; TSH 1.43 01/12/2018: ALT 19; BUN 21; Creatinine, Ser 1.03; Hemoglobin 15.3; Platelets 178.0; Potassium 4.6; Sodium 135    Lipid Panel     Component Value Date/Time   CHOL 121 01/12/2018 0804   CHOL 121 07/29/2016 0822   TRIG 80.0 01/12/2018 0804   HDL 44.30 01/12/2018 0804  HDL 56 07/29/2016 0822   CHOLHDL 3 01/12/2018 0804   VLDL 16.0 01/12/2018 0804   LDLCALC 61 01/12/2018 0804   LDLCALC 56 07/29/2016 0822     Wt Readings from Last 3 Encounters:  01/22/18 217 lb 3.2 oz (98.5 kg)  01/19/18 219 lb (99.3 kg)  12/29/17 220 lb 12.8 oz (100.2 kg)      Other studies Reviewed: Additional studies/ records that were reviewed today include: SPECT 10/17/15, TTE 10/09/14, Echo 07/13/17  Review of the above records today demonstrates:   The left ventricular ejection fraction is moderately decreased (30-44%).  Nuclear stress EF: 39%.  There was no ST segment deviation noted during stress.  There is a small defect of moderate severity present in the apical lateral and apex location and medium sized defect of moderate severity in the basal inferior, mild inferior and apical inferior location. These defects are fixed and consistent with scar. No ischemia noted.  This is an intermediate risk study due to LV dysfunction.   - Left ventricle: The cavity size was normal. Wall thickness was   normal. Systolic function was normal. The estimated  ejection   fraction was in the range of 55% to 60%. Wall motion was normal;   there were no regional wall motion abnormalities. Doppler   parameters are consistent with abnormal left ventricular   relaxation (grade 1 diastolic dysfunction). - Aortic valve: There was mild regurgitation. - Mitral valve: There was mild regurgitation. - Left atrium: The atrium was mildly dilated. - Right ventricle: The cavity size was mildly dilated. - Right atrium: The atrium was mildly dilated. - Pulmonary arteries: Systolic pressure was mildly increased. PA   peak pressure: 36 mm Hg (S).  Impressions:  - Normal LV systolic function; grade 1 diastolic dysfunction; mild   biatrial enlargement; mild RVE; calcified aortic valve with mild   AI; mild MR; trace TR with mildly elevated pulmonary pressure.  Echo 07/13/17 - Left ventricle: The cavity size was normal. Wall thickness was   increased in a pattern of mild LVH. Systolic function was normal.   The estimated ejection fraction was in the range of 55% to 60%.   Left ventricular diastolic function parameters were normal. - Aortic valve: There was mild regurgitation. - Left atrium: The atrium was moderately dilated. - Atrial septum: No defect or patent foramen ovale was identified. - Pulmonary arteries: PA peak pressure: 44 mm Hg (S).  ASSESSMENT AND PLAN:  1.  Persistent atrial fibrillation: This post ablation 12/19/2015.  Currently on metoprolol and Xarelto.  He does not feel much of a difference off the diltiazem. No episodes of afib noted. Continue present therapy  This patients CHA2DS2-VASc Score and unadjusted Ischemic Stroke Rate (% per year) is equal to 7.2 % stroke rate/year from a score of 5  Above score calculated as 1 point each if present [CHF, HTN, DM, Vascular=MI/PAD/Aortic Plaque, Age if 65-74, or Male] Above score calculated as 2 points each if present [Age > 75, or Stroke/TIA/TE]    2. CAD: No chest pain.  Continue current  management.  3. Chronic systolic heart failure: metoprolol, losartan and lasix.  Most recent echo results show a normal ejection fraction. No signs of fluid overload.  4. Hypertension: Well controlled today, no changes  5. Hyperlipidemia: Continue Lipitor  6. Ectatic abdominal aorta: We Tayron Hunnell need repeat ultrasound of his aorta prior to his next visit. Plan to repeat every 5 years.  Current medicines are reviewed at length with the patient today.  The patient does not have concerns regarding his medicines.  The following changes were made today: none  Labs/ tests ordered today include:  Orders Placed This Encounter  Procedures  . EKG 12-Lead     Disposition:   FU with Rilyn Scroggs 6 months  Signed, Lorrane Mccay Meredith Leeds, MD  01/22/2018 11:35 AM     [facility] [address] [place] [address] [phone] (office) [phone] (fax)  I have seen and examined this patient with Adline Peals.  Agree with above, note added to reflect my findings.  On exam, RRR, no murmurs, lungs clear.  Overall he is feeling well.  He is noted no further episodes of atrial fibrillation.  He did have pneumonia a few weeks ago.  He is off his antibiotics and noted no atrial fibrillation with his pneumonia.  No changes.  Yehia Mcbain M. Ellianah Cordy MD 01/22/2018 11:35 AM

## 2018-03-19 ENCOUNTER — Telehealth: Payer: Self-pay | Admitting: Internal Medicine

## 2018-03-19 ENCOUNTER — Encounter: Payer: Self-pay | Admitting: Internal Medicine

## 2018-03-19 ENCOUNTER — Inpatient Hospital Stay: Admission: RE | Admit: 2018-03-19 | Payer: Medicare Other | Source: Ambulatory Visit

## 2018-03-19 ENCOUNTER — Ambulatory Visit (INDEPENDENT_AMBULATORY_CARE_PROVIDER_SITE_OTHER): Payer: Medicare Other | Admitting: Internal Medicine

## 2018-03-19 ENCOUNTER — Ambulatory Visit (INDEPENDENT_AMBULATORY_CARE_PROVIDER_SITE_OTHER)
Admission: RE | Admit: 2018-03-19 | Discharge: 2018-03-19 | Disposition: A | Discharge status: Home / Self Care | Payer: Medicare Other | Source: Ambulatory Visit | Attending: Internal Medicine | Admitting: Internal Medicine

## 2018-03-19 VITALS — BP 110/60 | HR 48 | Ht 72.0 in | Wt 218.2 lb

## 2018-03-19 DIAGNOSIS — J449 Chronic obstructive pulmonary disease, unspecified: Secondary | ICD-10-CM | POA: Diagnosis not present

## 2018-03-19 DIAGNOSIS — I493 Ventricular premature depolarization: Secondary | ICD-10-CM | POA: Diagnosis not present

## 2018-03-19 DIAGNOSIS — I499 Cardiac arrhythmia, unspecified: Secondary | ICD-10-CM

## 2018-03-19 DIAGNOSIS — I509 Heart failure, unspecified: Secondary | ICD-10-CM

## 2018-03-19 LAB — EKG 12-LEAD

## 2018-03-19 LAB — BASIC METABOLIC PANEL
BUN: 24 mg/dL — ABNORMAL HIGH (ref 6–23)
CO2: 28 mEq/L (ref 19–32)
Calcium: 9.3 mg/dL (ref 8.4–10.5)
Chloride: 100 mEq/L (ref 96–112)
Creatinine, Ser: 1.12 mg/dL (ref 0.40–1.50)
GFR: 62.84 mL/min (ref >60.00)
Glucose, Bld: 100 mg/dL — ABNORMAL HIGH (ref 70–99)
Potassium: 4 mEq/L (ref 3.5–5.1)
Sodium: 137 mEq/L (ref 135–145)

## 2018-03-19 LAB — CBC WITH DIFFERENTIAL/PLATELET
Basophils Absolute: 0 10*3/uL (ref 0.0–0.1)
Basophils Relative: 0.3 % (ref 0.0–3.0)
EOS ABS: 0.2 10*3/uL (ref 0.0–0.7)
Eosinophils Relative: 2.2 % (ref 0.0–5.0)
HCT: 47.5 % (ref 39.0–52.0)
Hemoglobin: 16.3 g/dL (ref 13.0–17.0)
Lymphocytes Relative: 39.4 % (ref 12.0–46.0)
Lymphs Abs: 3.2 10*3/uL (ref 0.7–4.0)
MCHC: 34.3 g/dL (ref 30.0–36.0)
MCV: 96.1 fl (ref 78.0–100.0)
Monocytes Absolute: 0.8 10*3/uL (ref 0.1–1.0)
Monocytes Relative: 9.3 % (ref 3.0–12.0)
Neutro Abs: 4 10*3/uL (ref 1.4–7.7)
Neutrophils Relative %: 48.8 % (ref 43.0–77.0)
Platelets: 162 10*3/uL (ref 150.0–400.0)
RBC: 4.94 Mil/uL (ref 4.22–5.81)
RDW: 15.2 % (ref 11.5–15.5)
WBC: 8.2 10*3/uL (ref 4.0–10.5)

## 2018-03-19 LAB — BRAIN NATRIURETIC PEPTIDE: Pro B Natriuretic peptide (BNP): 61 pg/mL (ref 0.0–100.0)

## 2018-03-19 MED ORDER — LEVALBUTEROL HCL 0.63 MG/3ML IN NEBU
0.6300 mg | INHALATION_SOLUTION | Freq: Once | RESPIRATORY_TRACT | Status: AC
  Administered 2018-03-19: 0.63 mg via RESPIRATORY_TRACT

## 2018-03-19 NOTE — Progress Notes (Signed)
Subjective:    Patient ID: Theodore [name], male    DOB: 04-25-1934, 84 y.o.   MRN: [id number] HPI male never smoker followed for COPD/bronchitis, complicated by HBP, peripheral edema, GERD, A. Fib/ ablation, CAD, PE/DVT,  PFT 2011 FEV1/FVC 0.65.    PFT 11/30/13- moderate obstruction, insignificant response to bronchodilator, airtrapping, minimal diffusion defect. FEV1 2.21/ 70%, FEV1/FVC 0.63, TLC 97%, DLCO 78%.  -----------------------------------------------------------------------------------------  05/12/17- 84 year old male never smoker followed for COPD/bronchitis, complicated by HBP, peripheral edema, GERD, A. Fib, PE/DVT, CAD Spiriva, Xarelto ----COPD: Pt states he is doing well overall. Had blood clots in Jan/Feb after knee surgery.  Breathing is about back to normal with some dyspnea on exertion but little wheeze or cough, using Spiriva.  He had bleeding after TKR so Xarelto was held and during that interval he had left leg DVT with PE. We discussed a trial of Anoro instead of Spiriva for comparison. CTa chest 03/03/17- Bilateral lobar pulmonary emboli. Positive for acute PE with CTevidence of right heart strain (RV/LV Ratio = 1.32) consistent with at least submassive (intermediate risk) PE. The presence of right heart strain has been associated with an increased risk of morbidity and mortality. Mild cardiomegaly. Three vessel atherosclerotic coronary artery disease.  03/19/2018- 84 year old male never smoker followed for COPD/bronchitis, complicated by HBP, peripheral edema, GERD, A. Fib/ Xarelto, PE/DVT, CAD Spiriva, Xarelto -----pneumonia Nov 2019 - improved but last 6 weeks had start of congested cough again and heart feels like skipping CXR 01/19/2018- images reviewed. Persistent hazy LLL IMPRESSION: 1. No lung nodule is seen with nipple markers placed. 2. No change in hyper aeration and mild cardiomegaly  On arrival today HR 48/minute, BP 110/60, body weight 218 pounds,  room air saturation 98% He was diagnosed with pneumonia in November.  At his last visit with Dr. Camnitz/Cardiology in December he was in sinus rhythm.  Over the last 6 weeks he has had increased cough with scant clear sputum, rhinorrhea, and persistent irregular heartbeat which he recognizes as dropping every third beat.  He denies chest pain, syncope, ankle edema or increased nocturia. He has liked Anoro but does not feel he is getting a deep breath. EKG 03/19/2018-sinus bradycardia with frequent multifocal PVCs/ventricular trigeminy  ROS-see HPI + = positive Constitutional:   No-   weight loss, night sweats, fevers, chills, fatigue, lassitude. HEENT:   No-  headaches, difficulty swallowing, tooth/dental problems, sore throat,       No-  sneezing, itching, ear ache, nasal congestion, post nasal drip,  CV:  No-   chest pain, orthopnea, PND, swelling in lower extremities, anasarca, dizziness, +palpitations Resp: + shortness of breath with exertion or at rest.              No-   productive cough,  +non-productive cough,  No- coughing up of blood.              No-   change in color of mucus.  +wheezing.   Skin: No-   rash or lesions. GI:  No-   heartburn, indigestion, abdominal pain, nausea, vomiting,  GU:  MS:  No-   joint pain or swelling.  Neuro-     nothing unusual Psych:  No- change in mood or affect. No depression or anxiety.  No memory loss.  OBJ- Physical Exam General- Alert, Oriented, Affect-appropriate, Distress- none acute, looks fit and trim Skin- +ecchymoses on arms  Lymphadenopathy- none Head- atraumatic            Eyes- Gross  vision intact, PERRLA, conjunctivae and secretions clear            Ears- Hearing, canals-normal            Nose- Clear, no-Septal dev, mucus, polyps, erosion, perforation             Throat- Mallampati II , mucosa clear , drainage- none, tonsils- atrophic,  Neck- flexible , trachea midline, no stridor , thyroid nl, carotid no bruit Chest - symmetrical  excursion , unlabored           Heart/CV- RRR , no murmur , no gallop  , no rub, nl s1 s2                           - JVD- none , edema- none, stasis changes- none, varices- none           Lung-  wheeze- none, cough +deep/congested, dullness-none, rub- none           Chest wall-  Abd-  Br/ Gen/ Rectal- Not done, not indicated Extrem- cyanosis- none, clubbing, none, atrophy- none, strength- nl Neuro- grossly intact to observation

## 2018-03-19 NOTE — Patient Instructions (Addendum)
Order- EKG   Dx abnormal heart rhythm  Order- neb xop 0.63  Order- CXR    Dx congestive heart failure  Order- lab CBC w diff, BMET, BNP     Dx congestive heart failure  Referral to Cardiology for early f/u- has seen Drs. [place]/ [employer]

## 2018-03-19 NOTE — Telephone Encounter (Signed)
Patient seen at office today by CY and based on EKG that was performed and also with pt being in sinus brady, CY wanted pt to be seen by cards (either Dr. [name], Dr. [name], or PA asap).  Called [facility] and spoke with Avaletta stating to her the information about EKG that was performed and also due to sinus brady, we needed appt asap.  Per Avaletta, she could get pt scheduled with PA [facility] Tues. February 11 at 11:30. I stated to her to go ahead and book that appt for pt. appt has been scheduled.  Called and spoke with pt letting him know that I was able to schedule him the cards appt next week. Pt was currently driving so was unable to write appt info down but I stated to him that he should be able to see appt info in [place]. Stated to pt if he was unable to see appt info to call [facility] once he arrived home and to ask for Raquel Sarna since I was the one who scheduled his appt and I could give him the appt info.  Pt expressed understanding and was very thankful for the appt that was scheduled. Nothing further needed.

## 2018-03-19 NOTE — Assessment & Plan Note (Signed)
Significant chest congestion evident today suggest possibility of pulmonary edema.  We will see if a nebulizer treatment makes a difference.  He does not have right heart failure signs. Plan-CXR, BNP, neb xopenex 0.63

## 2018-03-19 NOTE — Addendum Note (Signed)
Addended by: Joella Prince on: 03/19/2018 12:48 PM   Modules accepted: Orders

## 2018-03-19 NOTE — Addendum Note (Signed)
Addended by: Suzzanne Cloud E on: 03/19/2018 12:34 PM   Modules accepted: Orders

## 2018-03-19 NOTE — Assessment & Plan Note (Signed)
Sinus bradycardia with multifocal PVCs suggests possibility that these are escape beats.  Current symptoms and awareness of dropped beats have been stable for at least the last few weeks.  Cardiology may want to back off metoprolol. Plan-labs for BNP, BMET, CBC,  will contact cardiology office for prompt f/u with Dr Ninfa Linden.

## 2018-03-20 ENCOUNTER — Telehealth: Payer: Self-pay | Admitting: Internal Medicine

## 2018-03-20 MED ORDER — BENZONATATE 200 MG PO CAPS: 200.0000 mg | ORAL_CAPSULE | Freq: Three times a day (TID) | ORAL | 1 refills | Status: DC | PRN

## 2018-03-20 NOTE — Telephone Encounter (Signed)
I called  to let him know labs look ok and not consistent with congestive heart failure, which I suspected yesterday. He will see cardiology on Tuesday about PVCs. Cough x 4 weeks does not sound viral. He had to stop lisinopril in past due to cough. Small possibility it is losartan ow. He will increase omeprazole to twice daily before meals in case reflux causing cough. I am sending in tessalon perles.

## 2018-03-22 ENCOUNTER — Telehealth: Payer: Self-pay | Admitting: Internal Medicine

## 2018-03-22 NOTE — Telephone Encounter (Signed)
Per CY ok to release the patients labs to mychart. Labs released. Called patient, he is aware of results being released. Nothing further needed.

## 2018-03-22 NOTE — Telephone Encounter (Signed)
Called and spoke to patient he is requesting his EKG, labs, and chest xray done at his last OV on 03/19/18 be posted to MyChart for him.  Dr. Annamaria Boots please advise.  Allergies  Allergen Reactions  . Penicillins Rash and Other (See Comments)    "Blistering rash" Has patient had a PCN reaction causing immediate rash, facial/tongue/throat swelling, SOB or lightheadedness with hypotension: Yes Has patient had a PCN reaction causing severe rash involving mucus membranes or skin necrosis: No Has patient had a PCN reaction that required hospitalization: No Has patient had a PCN reaction occurring within the last 10 years: No If all of the above answers are "NO", then may proceed with Cephalosporin use.   . Amlodipine Swelling and Other (See Comments)    Peripheral edema  . Lisinopril Cough  . Mevacor [Lovastatin] Other (See Comments)    Caused cataracts and elevated liver enzymes  . Vicodin [Hydrocodone-Acetaminophen] Nausea And Vomiting  . Zocor [Simvastatin] Other (See Comments)    LFT increase with simvastatin and lovastatin   . Oseltamivir Phosphate Itching and Rash   Current Outpatient Medications on File Prior to Visit  Medication Sig Dispense Refill  . acetaminophen (TYLENOL) 500 MG tablet Take 1 tablet (500 mg total) by mouth 3 (three) times daily as needed. 30 tablet 0  . albuterol (PROVENTIL HFA;VENTOLIN HFA) 108 (90 Base) MCG/ACT inhaler Inhale 1-2 puffs into the lungs every 6 (six) hours as needed. 1 Inhaler 0  . Ascorbic Acid (VITAMIN C PO) Take 1 tablet by mouth daily.     [name] atorvastatin (LIPITOR) 40 MG tablet TAKE 1 TABLET DAILY 90 tablet 3  . benzonatate (TESSALON) 200 MG capsule Take 1 capsule (200 mg total) by mouth 3 (three) times daily as needed for cough. 30 capsule 1  . Cholecalciferol (VITAMIN D3) 2000 units TABS Take 2,000 Units by mouth daily.    [name] ezetimibe (ZETIA) 10 MG tablet TAKE 1 TABLET DAILY 90 tablet 3  . furosemide (LASIX) 40 MG tablet Take 1 tablet (40 mg  total) by mouth daily. 90 tablet 3  . losartan (COZAAR) 100 MG tablet Take 100 mg by mouth once daily 90 tablet 3  . LYSINE PO Take 1 tablet by mouth daily.     . metoprolol succinate (TOPROL-XL) 100 MG 24 hr tablet Take 1 tablet (100 mg total) by mouth 2 (two) times daily. Take with or immediately following a meal. 180 tablet 3  . omeprazole (PRILOSEC) 20 MG capsule Take 20 mg by mouth daily.      . potassium chloride (K-DUR,KLOR-CON) 10 MEQ tablet TAKE 1 TABLET TWICE A DAY 180 tablet 4  . umeclidinium-vilanterol (ANORO ELLIPTA) 62.5-25 MCG/INH AEPB Inhale 1 puff into the lungs daily. 180 each 1  . XARELTO 20 MG TABS tablet TAKE 1 TABLET DAILY WITH SUPPER 90 tablet 1   No current facility-administered medications on file prior to visit.

## 2018-03-22 NOTE — Telephone Encounter (Signed)
Ok to post labs as requested

## 2018-03-23 ENCOUNTER — Other Ambulatory Visit: Payer: Self-pay

## 2018-03-23 ENCOUNTER — Ambulatory Visit (INDEPENDENT_AMBULATORY_CARE_PROVIDER_SITE_OTHER): Payer: Medicare Other | Admitting: Cardiology

## 2018-03-23 ENCOUNTER — Encounter: Payer: Self-pay | Admitting: Cardiology

## 2018-03-23 ENCOUNTER — Telehealth: Payer: Self-pay | Admitting: Internal Medicine

## 2018-03-23 VITALS — BP 118/62 | HR 62 | Ht 72.0 in | Wt 219.0 lb

## 2018-03-23 DIAGNOSIS — I2510 Atherosclerotic heart disease of native coronary artery without angina pectoris: Secondary | ICD-10-CM

## 2018-03-23 DIAGNOSIS — R05 Cough: Secondary | ICD-10-CM

## 2018-03-23 DIAGNOSIS — J449 Chronic obstructive pulmonary disease, unspecified: Secondary | ICD-10-CM | POA: Diagnosis not present

## 2018-03-23 DIAGNOSIS — R0602 Shortness of breath: Secondary | ICD-10-CM

## 2018-03-23 DIAGNOSIS — I428 Other cardiomyopathies: Secondary | ICD-10-CM

## 2018-03-23 DIAGNOSIS — Z86711 Personal history of pulmonary embolism: Secondary | ICD-10-CM

## 2018-03-23 DIAGNOSIS — Z7901 Long term (current) use of anticoagulants: Secondary | ICD-10-CM

## 2018-03-23 DIAGNOSIS — I493 Ventricular premature depolarization: Secondary | ICD-10-CM

## 2018-03-23 DIAGNOSIS — I480 Paroxysmal atrial fibrillation: Secondary | ICD-10-CM

## 2018-03-23 DIAGNOSIS — R059 Cough, unspecified: Secondary | ICD-10-CM

## 2018-03-23 MED ORDER — PREDNISONE 10 MG PO TABS: ORAL_TABLET | ORAL | 0 refills | Status: DC

## 2018-03-23 NOTE — Patient Instructions (Addendum)
Medication Instructions:  Your physician recommends that you continue on your current medications as directed. Please refer to the Current Medication list given to you today. If you need a refill on your cardiac medications before your next appointment, please call your pharmacy.   Lab work: NONE  If you have labs (blood work) drawn today and your tests are completely normal, you will receive your results only by: [name] MyChart Message (if you have MyChart) OR . A paper copy in the mail If you have any lab test that is abnormal or we need to change your treatment, we will call you to review the results.  Testing/Procedures: Your physician has requested that you have an echocardiogram. Echocardiography is a painless test that uses sound waves to create images of your heart. It provides your doctor with information about the size and shape of your heart and how well your heart's chambers and valves are working. This procedure takes approximately one hour. There are no restrictions for this procedure. [place] TEST IN 1-2 WEEKS   Follow-Up: At [facility], you and your health needs are our priority.  As part of our continuing mission to provide you with exceptional heart care, we have created designated Provider Care Teams.  These Care Teams include your primary Cardiologist (physician) and Advanced Practice Providers (APPs -  Physician Assistants and Nurse Practitioners) who all work together to provide you with the care you need, when you need it.  .  Your physician recommends that you schedule a follow-up appointment in: 3-4 weeks with Kerin Ransom, PA-C  Any Other Special Instructions Will Be Listed Below (If Applicable).

## 2018-03-23 NOTE — Progress Notes (Addendum)
03/23/2018 Theodore [name]   Nov 05, 1934  [id number]  Primary Physician [name], MD Primary Cardiologist: Dr [name]  HPI:  Theodore [name] is a 84 y.o. male with chronic systolic and diastolic heart failure, HTN, HLD, PAF, asthma and Rheumatic fever .  [name] was previously a patient of Dr. Mare Ferrari.  [name] was diagnosed with atrial fibrillation in 2014.  He was initially on Pradaxa and switched to Xarelto.  He had an episode of chest pain 08/2014 and had a Lexiscan Myoview 08/31/14 that showed an old inferior scar with peri-infarct ischemia and LVEF 49%.  He then had a LHC that revealed 70% ostial LAD, 50% prox LAD, 30% OM1 and 35% RCA.  He was managed medically.  He subsequently had an echo 09/2014 that showed LVEF 55-60% with mild MR and AR and grade 1 diastolic dysfunction.  At that time atorvastatin was increased to 80 mg but he noted leg pain.  He was seen in the lipid clinic and started back on atorvastatin 40mg  and zetia 10 mg.    [name] was seen in clinic 09/25/15 and reported significant shortness of breath with minimal exertion.  He had a LHC with Dr. Ellyn Hack on 09/28/15 that again revealed a 70% ostial-proximal LAD lesion.  Dr. Ellyn Hack performed FFR on that lesion and there was no significant change.  The left ventriculogram performed at that time revealed LVEF 35-45%.  Mr. Jou continued to have exertional dyspnea so he was referred for [facility] 10/17/15 that was negative for ischemia. He subsequently underwent DCCV on 10/23/15.  He underwent ablation on 12/19/15 with Dr. [name] and has remained in sinus rhythm, his LOV with Dr [name] was in Nov 2019 and he was in NSR.    [name] underwent L knee replacement on  02/09/17.  Xarelto was held for 2 days prior to surgery and resumed post-operatively.  He developed bleeding at the incision site so Xarelto was again held and he was switched to aspirin.  He subsequently developed a left lower extremity DVT and  pulmonary embolism.  Xarelto was resumed.  He denies shortness of breath but has reported significant cough.  He had an echocardiogram 03/04/17 that revealed LVEF 50-55% with moderate to severe pulmonary arterial hypertension with a PASP of 60 mmHg. He has been followed by Dr Annamaria Boots.  He recently developed a persistent cough and palpitations.  He saw Dr Annamaria Boots yesterday and was placed on Tessalon and labs obtained.  There was concern this cough may be secondary to CHF.  The patient was also noted to have multiple PVCs on EKG but he was in NSR.  The pt's BNP was normal- 61.  His BUN actually suggested he may be a little dry.      Current Outpatient Medications  Medication Sig Dispense Refill  . acetaminophen (TYLENOL) 500 MG tablet Take 1 tablet (500 mg total) by mouth 3 (three) times daily as needed. 30 tablet 0  . albuterol (PROVENTIL HFA;VENTOLIN HFA) 108 (90 Base) MCG/ACT inhaler Inhale 1-2 puffs into the lungs every 6 (six) hours as needed. 1 Inhaler 0  . Ascorbic Acid (VITAMIN C PO) Take 1 tablet by mouth daily.     [name] atorvastatin (LIPITOR) 40 MG tablet TAKE 1 TABLET DAILY 90 tablet 3  . benzonatate (TESSALON) 200 MG capsule Take 1 capsule (200 mg total) by mouth 3 (three) times daily as needed for cough. 30 capsule 1  . Cholecalciferol (VITAMIN D3) 2000 units TABS Take 2,000  Units by mouth daily.    [name] ezetimibe (ZETIA) 10 MG tablet TAKE 1 TABLET DAILY 90 tablet 3  . furosemide (LASIX) 40 MG tablet Take 1 tablet (40 mg total) by mouth daily. 90 tablet 3  . losartan (COZAAR) 100 MG tablet Take 100 mg by mouth once daily 90 tablet 3  . LYSINE PO Take 1 tablet by mouth daily.     . metoprolol succinate (TOPROL-XL) 100 MG 24 hr tablet Take 1 tablet (100 mg total) by mouth 2 (two) times daily. Take with or immediately following a meal. 180 tablet 3  . omeprazole (PRILOSEC) 20 MG capsule Take 20 mg by mouth daily.      . potassium chloride (K-DUR,KLOR-CON) 10 MEQ tablet TAKE 1 TABLET TWICE A DAY 180  tablet 4  . umeclidinium-vilanterol (ANORO ELLIPTA) 62.5-25 MCG/INH AEPB Inhale 1 puff into the lungs daily. 180 each 1  . XARELTO 20 MG TABS tablet TAKE 1 TABLET DAILY WITH SUPPER 90 tablet 1   No current facility-administered medications for this visit.     Allergies  Allergen Reactions  . Penicillins Rash and Other (See Comments)    "Blistering rash" Has patient had a PCN reaction causing immediate rash, facial/tongue/throat swelling, SOB or lightheadedness with hypotension: Yes Has patient had a PCN reaction causing severe rash involving mucus membranes or skin necrosis: No Has patient had a PCN reaction that required hospitalization: No Has patient had a PCN reaction occurring within the last 10 years: No If all of the above answers are "NO", then may proceed with Cephalosporin use.   . Amlodipine Swelling and Other (See Comments)    Peripheral edema  . Lisinopril Cough  . Mevacor [Lovastatin] Other (See Comments)    Caused cataracts and elevated liver enzymes  . Vicodin [Hydrocodone-Acetaminophen] Nausea And Vomiting  . Zocor [Simvastatin] Other (See Comments)    LFT increase with simvastatin and lovastatin   . Oseltamivir Phosphate Itching and Rash    Past Medical History:  Diagnosis Date  . History of arthritis   . History of rheumatic fever   . Hypercholesterolemia   . Hypertension   . Nocturia   . PAF (paroxysmal atrial fibrillation) [facility]) January 2013   Placed on Pradaxa. Did not require cardioversion; spontaneously converted  . Peripheral edema   . Right bundle branch block   . SOB (shortness of breath)   . Tendonitis of elbow, left     Social History   Socioeconomic History  . Marital status: Married    Spouse name: Not on file  . Number of children: Not on file  . Years of education: Not on file  . Highest education level: Not on file  Occupational History  . Occupation: retired    Fish farm manager: RETIRED    Comment: Art gallery manager  Social Needs    . Financial resource strain: Not on file  . Food insecurity:    Worry: Not on file    Inability: Not on file  . Transportation needs:    Medical: Not on file    Non-medical: Not on file  Tobacco Use  . Smoking status: Never Smoker  . Smokeless tobacco: Never Used  Substance and Sexual Activity  . Alcohol use: Yes    Alcohol/week: 14.0 standard drinks    Types: 14 Cans of beer per week    Comment: 2 beers daily  . Drug use: No  . Sexual activity: Not Currently  Lifestyle  . Physical activity:    Days per week:  Not on file    Minutes per session: Not on file  . Stress: Not on file  Relationships  . Social connections:    Talks on phone: Not on file    Gets together: Not on file    Attends religious service: Not on file    Active member of club or organization: Not on file    Attends meetings of clubs or organizations: Not on file    Relationship status: Not on file  . Intimate partner violence:    Fear of current or ex partner: Not on file    Emotionally abused: Not on file    Physically abused: Not on file    Forced sexual activity: Not on file  Other Topics Concern  . Not on file  Social History Narrative   Lives with wife, no pets   Retired   Occ: Art gallery manager   Edu: master's   Activity: golf   Diet: good water, fruits/vegetables daily     Family History  Problem Relation Age of Onset  . Heart attack Father   . COPD Father   . Hypertension Father   . Heart disease Brother   . COPD Brother   . Asthma Sister   . Stroke Neg Hx      Review of Systems: General: negative for chills, fever, night sweats or weight changes.  Cardiovascular: negative for chest pain,  edema, orthopnea, palpitations, paroxysmal nocturnal dyspnea or shortness of breath Dermatological: negative for rash Respiratory: positive for cough or wheezing Urologic: negative for hematuria Abdominal: negative for nausea, vomiting, diarrhea, bright red blood per rectum, melena, or  hematemesis Neurologic: negative for visual changes, syncope, or dizziness All other systems reviewed and are otherwise negative except as noted above.    Blood pressure 118/62, pulse 62, height 6' (1.829 m), weight 219 lb (99.3 kg), SpO2 96 %.  General appearance: alert, cooperative and no distress Neck: no carotid bruit and no JVD Lungs: bilateral expiratroy wheezing Heart: regular rate and rhythm and extar systole noted Extremities: no edema Skin: warm and dry Neurologic: Grossly normal   ASSESSMENT AND PLAN:   Cough Persistent cough- he does not appear to be in CHF on exam and BNP was normal  Paroxysmal atrial fibrillation (HCC) NSR today with PVCs  Right bundle branch block Chronic  COPD mixed type ([place]) H/O moderate COPD-wheezing on exam  CAD (coronary artery disease), native coronary artery Known 70% LAD with negative Myoview and negative FFR  Nonischemic cardiomyopathy (HCC) Last EF 55-60% by echo June 2019  S/P left TKA, distal mass excision S/P LTKR 02/09/17 with post op bleeding/. Xarelto held  Pulmonary embolism 03/03/17-post op knee surgery  Chronic anticoagulation CHADS VASC=5   PLAN  I'll review with Dr Annamaria Boots- consider steroid dose pack and Claritin.  We may have to consider stopping his ARB as well.  I did order an echo to confirm his LVF has not changed.   Kerin Ransom PA-C 03/23/2018 12:25 PM   2:33 PM Discussed with Dr Annamaria Boots- he will contact the patient and order steroid dose pack.  Kerin Ransom PA-C 03/23/2018 2:33 PM

## 2018-03-23 NOTE — Telephone Encounter (Signed)
Pa wanted to speak with Dr. Annamaria Boots will route massage as he speaks with the PA

## 2018-03-23 NOTE — Telephone Encounter (Signed)
I discussed with Cardiology. There is more wheezing now, and no apparent significant cardiac change, so I recommend we treat with a prednisone taper to calm down a bronchitis.  Plan- order prednisone 10 mg, # 20, 4 X 2 DAYS, 3 X 2 DAYS, 2 X 2 DAYS, 1 X 2 DAYS Please let Theodore [name] know.

## 2018-03-23 NOTE — Assessment & Plan Note (Signed)
Persistent cough- he does not appear to be in CHF on exam and BNP was normal

## 2018-03-23 NOTE — Telephone Encounter (Signed)
Called and spoke with Patient.  Dr Annamaria Boots recommendations given. Understanding given.  Prednisone taper sent to requested pharmacy, [facility]  Nothing further at this time.

## 2018-03-25 ENCOUNTER — Ambulatory Visit (HOSPITAL_COMMUNITY): Payer: Medicare Other | Attending: Cardiology

## 2018-03-25 DIAGNOSIS — R0602 Shortness of breath: Secondary | ICD-10-CM | POA: Diagnosis not present

## 2018-03-25 DIAGNOSIS — I2510 Atherosclerotic heart disease of native coronary artery without angina pectoris: Secondary | ICD-10-CM | POA: Insufficient documentation

## 2018-03-30 ENCOUNTER — Other Ambulatory Visit: Payer: Self-pay | Admitting: *Deleted

## 2018-03-30 DIAGNOSIS — I77819 Aortic ectasia, unspecified site: Secondary | ICD-10-CM

## 2018-04-08 ENCOUNTER — Other Ambulatory Visit (HOSPITAL_COMMUNITY): Payer: Medicare Other

## 2018-04-13 ENCOUNTER — Other Ambulatory Visit: Payer: Self-pay | Admitting: Cardiovascular Disease

## 2018-04-13 NOTE — Telephone Encounter (Signed)
Weight 99kg, age 84, SCr 1.12 from 03/19/18, CrCl 75mL/min.

## 2018-04-21 ENCOUNTER — Encounter: Payer: Self-pay | Admitting: Cardiology

## 2018-04-21 ENCOUNTER — Other Ambulatory Visit: Payer: Self-pay

## 2018-04-21 ENCOUNTER — Ambulatory Visit (INDEPENDENT_AMBULATORY_CARE_PROVIDER_SITE_OTHER): Payer: Medicare Other | Admitting: Cardiology

## 2018-04-21 VITALS — BP 130/80 | HR 67 | Ht 71.0 in | Wt 219.0 lb

## 2018-04-21 DIAGNOSIS — I2510 Atherosclerotic heart disease of native coronary artery without angina pectoris: Secondary | ICD-10-CM | POA: Diagnosis not present

## 2018-04-21 DIAGNOSIS — J449 Chronic obstructive pulmonary disease, unspecified: Secondary | ICD-10-CM

## 2018-04-21 DIAGNOSIS — Z86711 Personal history of pulmonary embolism: Secondary | ICD-10-CM

## 2018-04-21 DIAGNOSIS — I493 Ventricular premature depolarization: Secondary | ICD-10-CM

## 2018-04-21 DIAGNOSIS — I480 Paroxysmal atrial fibrillation: Secondary | ICD-10-CM | POA: Diagnosis not present

## 2018-04-21 DIAGNOSIS — Z7901 Long term (current) use of anticoagulants: Secondary | ICD-10-CM

## 2018-04-21 MED ORDER — METOPROLOL TARTRATE 25 MG PO TABS: 25.0000 mg | ORAL_TABLET | Freq: Two times a day (BID) | ORAL | 1 refills | Status: DC

## 2018-04-21 MED ORDER — METOPROLOL SUCCINATE ER 50 MG PO TB24: ORAL_TABLET | ORAL | Status: DC

## 2018-04-21 NOTE — Patient Instructions (Signed)
Medication Instructions:  TAKE Metoprolol Succinate half a tablet for three days THEN STOP THEN START Metoprolol Tartrate 25mg  take 1 tablet twice a day If you need a refill on your cardiac medications before your next appointment, please call your pharmacy.   Lab work: NONE If you have labs (blood work) drawn today and your tests are completely normal, you will receive your results only by: [name] MyChart Message (if you have MyChart) OR . A paper copy in the mail If you have any lab test that is abnormal or we need to change your treatment, we will call you to review the results.  Testing/Procedures: NONE  Follow-Up: At [facility], you and your health needs are our priority.  As part of our continuing mission to provide you with exceptional heart care, we have created designated Provider Care Teams.  These Care Teams include your primary Cardiologist (physician) and Advanced Practice Providers (APPs -  Physician Assistants and Nurse Practitioners) who all work together to provide you with the care you need, when you need it.  . Your physician recommends that you schedule a follow-up appointment in: [place], PA-C  Any Other Special Instructions Will Be Listed Below (If Applicable).

## 2018-04-21 NOTE — Progress Notes (Signed)
04/21/2018 Theodore [name]   1934/04/24  [id number]  Primary Physician [name], MD Primary Cardiologist: Dr [name]- Dr [name] EP  HPI:  The patient is an 84 y.o.malewith a history of HTN, HLD, PAF, asthma, and a history of Rheumatic fever.Theodore [name] was previously a patient of Dr. Mare Ferrari. He was diagnosed with atrial fibrillation in 2014. He was initially on Pradaxa and switched to Xarelto. He had an episode of chest pain 08/2014 and had a Lexiscan Myoview that showed an old inferior scar with peri-infarct ischemia and LVEF 49%. He then had a LHC Aug 2017 that revealed 70% ostial LAD, 50% prox LAD, 30% OM1 and 35% RCA. He was managed medically. He subsequently had an echo 09/2014 that showed LVEF 55-60% with mild MR and AR and grade 1 diastolic dysfunction.   Theodore [name] was seen in clinic 09/25/15 and reported significant shortness of breath with minimal exertion. He had a LHC with Dr. Ellyn Hack on 09/28/15 that again revealed a 70% ostial-proximal LAD lesion. Dr. Ellyn Hack performed FFR on that lesion and there was no significant change. The left ventriculogram performed at that time revealed LVEF 35-45%. Theodore [name] continued to have exertional dyspnea so he was referred for Lexiscan Myoview9/6/17 that was negative for ischemia. He subsequently underwent DCCV on 10/23/15. He underwent ablation on 12/19/15 with Dr. [name] and has remained in sinus rhythm, his LOV with Dr [name] was in Nov 2019 and he was in NSR.   Theodore [name] underwent L knee replacement on 02/09/17. Xarelto was held for 2 days prior to surgery and resumed post-operatively. He developed bleeding at the incision site so Xarelto was again held and he was switched to aspirin. He subsequently developed a left lower extremity DVT and pulmonary embolism. Xarelto was resumed. He denies shortness of breath but has reported significant cough. He had an echocardiogram 03/04/17 that revealed LVEF 50-55% with moderate to  severe pulmonary arterial hypertension with a PASP of 60 mmHg. He has been followed by Dr Annamaria Boots.  He was recently seen in the office 03/23/2018 after he developed a persistent cough and palpitations. There was concern this cough may be secondary to CHF.  The patient was also noted to have multiple PVCs on EKG but he was in NSR.  The pt's BNP was normal- 61.  His BUN actually suggested he may be a little dehydrated. He was referred back to Dr Annamaria Boots and placed on a steroid dose pack.  The patient tells me that his symptoms improved but have recurred. In the office today he complains of DOE, cough, and PVCs.  He denies chest pain. Echo done 03/25/2018 showed EF of 50%. The patient tells me his symptoms started after he had "pneumonia" this past December.    Current Outpatient Medications  Medication Sig Dispense Refill   acetaminophen (TYLENOL) 500 MG tablet Take 1 tablet (500 mg total) by mouth 3 (three) times daily as needed. 30 tablet 0   Ascorbic Acid (VITAMIN C PO) Take 1 tablet by mouth daily.      atorvastatin (LIPITOR) 40 MG tablet TAKE 1 TABLET DAILY 90 tablet 3   Cholecalciferol (VITAMIN D3) 2000 units TABS Take 2,000 Units by mouth daily.     ezetimibe (ZETIA) 10 MG tablet TAKE 1 TABLET DAILY 90 tablet 3   furosemide (LASIX) 40 MG tablet Take 1 tablet (40 mg total) by mouth daily. 90 tablet 3   losartan (COZAAR) 100 MG tablet Take 100 mg by mouth once daily 90 tablet  3   LYSINE PO Take 1 tablet by mouth daily.      metoprolol succinate (TOPROL-XL) 50 MG 24 hr tablet Take half tablet for 3 days. Take with or immediately following a meal.     omeprazole (PRILOSEC) 20 MG capsule Take 20 mg by mouth daily.       potassium chloride (K-DUR,KLOR-CON) 10 MEQ tablet TAKE 1 TABLET TWICE A DAY 180 tablet 4   umeclidinium-vilanterol (ANORO ELLIPTA) 62.5-25 MCG/INH AEPB Inhale 1 puff into the lungs daily. 180 each 1   XARELTO 20 MG TABS tablet TAKE 1 TABLET DAILY WITH SUPPER 90 tablet 1    metoprolol tartrate (LOPRESSOR) 25 MG tablet Take 1 tablet (25 mg total) by mouth 2 (two) times daily. 60 tablet 1   No current facility-administered medications for this visit.     Allergies  Allergen Reactions   Penicillins Rash and Other (See Comments)    "Blistering rash" Has patient had a PCN reaction causing immediate rash, facial/tongue/throat swelling, SOB or lightheadedness with hypotension: Yes Has patient had a PCN reaction causing severe rash involving mucus membranes or skin necrosis: No Has patient had a PCN reaction that required hospitalization: No Has patient had a PCN reaction occurring within the last 10 years: No If all of the above answers are "NO", then may proceed with Cephalosporin use.    Amlodipine Swelling and Other (See Comments)    Peripheral edema   Lisinopril Cough   Mevacor [Lovastatin] Other (See Comments)    Caused cataracts and elevated liver enzymes   Vicodin [Hydrocodone-Acetaminophen] Nausea And Vomiting   Zocor [Simvastatin] Other (See Comments)    LFT increase with simvastatin and lovastatin    Oseltamivir Phosphate Itching and Rash    Past Medical History:  Diagnosis Date   History of arthritis    History of rheumatic fever    Hypercholesterolemia    Hypertension    Nocturia    PAF (paroxysmal atrial fibrillation) [facility]) January 2013   Placed on Pradaxa. Did not require cardioversion; spontaneously converted   Peripheral edema    Right bundle branch block    SOB (shortness of breath)    Tendonitis of elbow, left     Social History   Socioeconomic History   Marital status: Married    Spouse name: Not on file   Number of children: Not on file   Years of education: Not on file   Highest education level: Not on file  Occupational History   Occupation: retired    Fish farm manager: RETIRED    Comment: Psychiatrist strain: Not on file   Food insecurity:    Worry: Not on  file    Inability: Not on file   Transportation needs:    Medical: Not on file    Non-medical: Not on file  Tobacco Use   Smoking status: Never Smoker   Smokeless tobacco: Never Used  Substance and Sexual Activity   Alcohol use: Yes    Alcohol/week: 14.0 standard drinks    Types: 14 Cans of beer per week    Comment: 2 beers daily   Drug use: No   Sexual activity: Not Currently  Lifestyle   Physical activity:    Days per week: Not on file    Minutes per session: Not on file   Stress: Not on file  Relationships   Social connections:    Talks on phone: Not on file    Gets together: Not on  file    Attends religious service: Not on file    Active member of club or organization: Not on file    Attends meetings of clubs or organizations: Not on file    Relationship status: Not on file   Intimate partner violence:    Fear of current or ex partner: Not on file    Emotionally abused: Not on file    Physically abused: Not on file    Forced sexual activity: Not on file  Other Topics Concern   Not on file  Social History Narrative   Lives with wife, no pets   Retired   Occ: Art gallery manager   Edu: master's   Activity: golf   Diet: good water, fruits/vegetables daily     Family History  Problem Relation Age of Onset   Heart attack Father    COPD Father    Hypertension Father    Heart disease Brother    COPD Brother    Asthma Sister    Stroke Neg Hx      Review of Systems: General: negative for chills, fever, night sweats or weight changes.  Cardiovascular: negative for chest pain, dyspnea on exertion, edema, orthopnea, palpitations, paroxysmal nocturnal dyspnea or shortness of breath Dermatological: negative for rash Respiratory: negative for cough or wheezing Urologic: negative for hematuria Abdominal: negative for nausea, vomiting, diarrhea, bright red blood per rectum, melena, or hematemesis Neurologic: negative for visual changes, syncope, or  dizziness All other systems reviewed and are otherwise negative except as noted above.    Blood pressure 130/80, pulse 67, height 5\' 11"  (1.803 m), weight 219 lb (99.3 kg), SpO2 96 %.  General appearance: alert, cooperative and no distress Neck: no carotid bruit and no JVD Lungs: expiratory wheezing bilaterally  Heart: regular rate and rhythm Extremities: no edema Skin: Skin color, texture, turgor normal. No rashes or lesions Neurologic: Grossly normal  EKG NSR, 68, RBBB, two PVCs  ASSESSMENT AND PLAN:   Cough Persistent cough- he does not appear to be in CHF on exam and BNP was normal and EF 50% by echo  COPD mixed type ([place]) H/O moderate COPD-wheezing on exam- I'm concerned high dose beta blocker may be contributing- I will ask him to taper Toprol 100 mg BID down to Metoprolol tartrate 25 mg BID  Paroxysmal atrial fibrillation (HCC) NSR today with PVCs  Right bundle branch block Chronic  CAD (coronary artery disease), native coronary artery Known 70% LAD (cath 2017 and 2018) with negative Myoview and negative FFR 2018.  Unable to do Lexiscan with ongoing wheezing.  He gives me no history of angina.   Nonischemic cardiomyopathy ([place]) Last EF 50% 03/25/2018  S/P left TKA, distal mass excision S/P LTKR 02/09/17 with post op bleeding/. Xarelto held-subsequent PE  Pulmonary embolism 03/03/17-post op knee surgery  Chronic anticoagulation CHADS VASC=5  PLAN  Will see if decreasing beta blocker helps. He is definitely wheezing on exam.  Hopefully he won't have increased PVCs or recurrent AF.  I suspect his underlying lung disease is more significant than he suspects.  He has a follow up with Dr Annamaria Boots in three weeks.   Kerin Ransom PA-C 04/21/2018 4:48 PM

## 2018-04-30 ENCOUNTER — Telehealth: Payer: Self-pay | Admitting: Cardiology

## 2018-04-30 ENCOUNTER — Ambulatory Visit: Payer: Medicare Other | Admitting: Cardiology

## 2018-04-30 NOTE — Telephone Encounter (Signed)
   Primary Cardiologist:  Skeet Latch, MD   Patient contacted.  History reviewed.  No symptoms to suggest any unstable cardiac conditions.  Based on discussion, with current pandemic situation, we will be postponing this appointment for Theodore [name].  If symptoms change, he has been instructed to contact our office.  I had a long discussion with Theodore [name] on the phone.  His cough and exertional shortness of breath did not change with reduction in his beta-blocker dose from Toprol-XL 100 mg twice daily to metoprolol tartrate 25 mg twice daily.  He has noted some increase in his blood pressure.  He is not short of breath at rest.  His heart rate is controlled, he feels like he has PVCs which has been documented on prior EKGs.  He gives me no history of exertional chest tightness or chest pain.  His main complaint is cough.  This got better after he took a course of steroids back in February.  He has an appointment with Dr. Annamaria Boots April 3.  Overall he says he feels no worse no better.  I offered for him to come into the office for an EKG and exam.  At this point with the current situation he feels like he should wait and see Dr. Annamaria Boots, I agreed with him.  He knows to contact us if he has shortness of breath at rest or increased tachycardia. I did ask him to resume Toprol XL 100 mg daily.  Routing to C19 CANCEL pool for tracking (P CV DIV CV19 CANCEL) and assigning priority (1 = 4-6 wks, 2 = 6-12 wks, 3 = >12 wks).  Kerin Ransom, [place]  04/30/2018 9:25 AM         .

## 2018-05-07 ENCOUNTER — Ambulatory Visit (INDEPENDENT_AMBULATORY_CARE_PROVIDER_SITE_OTHER): Payer: Medicare Other | Admitting: Nurse Practitioner

## 2018-05-07 ENCOUNTER — Encounter: Payer: Self-pay | Admitting: Nurse Practitioner

## 2018-05-07 ENCOUNTER — Other Ambulatory Visit: Payer: Self-pay

## 2018-05-07 DIAGNOSIS — J449 Chronic obstructive pulmonary disease, unspecified: Secondary | ICD-10-CM

## 2018-05-07 MED ORDER — PREDNISONE 10 MG PO TABS: 20.0000 mg | ORAL_TABLET | Freq: Every day | ORAL | 0 refills | Status: AC

## 2018-05-07 MED ORDER — ALBUTEROL SULFATE HFA 108 (90 BASE) MCG/ACT IN AERS: 2.0000 | INHALATION_SPRAY | Freq: Four times a day (QID) | RESPIRATORY_TRACT | 3 refills | Status: DC | PRN

## 2018-05-07 NOTE — Assessment & Plan Note (Signed)
Patient has a tele-visit today for follow-up visit.  He was seen by Dr. Annamaria Boots on 03/19/2018.  Patient had a abnormal heart rhythm at his last visit with Dr. Annamaria Boots.  He was ordered EKG, chest x-ray, and labs.  Dr. Annamaria Boots was concern for congestive heart failure.  He has followed up with his cardiologist since last visit cardiology tapered Toprol 100 mg twice daily down to metoprolol tartrate 25 mg twice daily.  Patient states that overall he has been stable.  He still complains of ongoing nonproductive cough.  He feels that he cannot get a deep enough breath.  Concerned that he may not be getting deep enough breath to get full effect from Anoro Ellipta inhaler.  He states that he has been wheezing occasionally. Patient's last PFT was in 2015.  We will reorder a PFT at next visit.  Patient is reluctant to change inhalers at this time would like to continue Anoro until we get the results of PFT.  Patient was wheezing over the phone today we will treat with short course of prednisone.  Patient does complain today also clear nasal drainage.  Will order Zyrtec.  Patient Instructions  Will order prednisone Will order Zyrtec daily Continue Anoro Ellipta We will reorder albuterol as needed  Follow-up with Dr. Annamaria Boots in 4 months with PFT before appointment. Follow-up sooner if needed.

## 2018-05-07 NOTE — Progress Notes (Signed)
Virtual Visit via Telephone Note  I connected with Theodore [name] on 05/07/18 at  9:30 AM EDT by telephone and verified that I am speaking with the correct person using two identifiers.   I discussed the limitations, risks, security and privacy concerns of performing an evaluation and management service by telephone and the availability of in person appointments. I also discussed with the patient that there may be a patient responsible charge related to this service. The patient expressed understanding and agreed to proceed.   History of Present Illness: 84 year old male never smoker followed for COPD/bronchitis, complicated by HBP, peripheral edema, GERD, A. Fib/ Xarelto, PE/DVT, CAD  Patient has a tele-visit today for follow-up visit.  He was seen by Dr. Annamaria Boots on 03/19/2018.  Patient had a abnormal heart rhythm at his last visit with Dr. Annamaria Boots.  He was ordered EKG, chest x-ray, and labs.  Dr. Annamaria Boots was concern for congestive heart failure.  He has followed up with his cardiologist since last visit cardiology tapered Toprol 100 mg twice daily down to metoprolol tartrate 25 mg twice daily.  Patient states that overall he has been stable.  He still complains of ongoing nonproductive cough.  He feels that he cannot get a deep enough breath.  Concerned that he may not be getting deep enough breath to get full effect from Anoro Ellipta inhaler.  He states that he has been wheezing occasionally.  He also complains of clear nasal drainage.  He is compliant with Anoro. Denies f/c/s, n/v/d, hemoptysis, PND, leg swelling.      Observations/Objective:  Note: Patient was adudibly wheezing over the phone.   CXR 03/19/18 - No active cardiopulmonary disease.  Recent Results (from the past 2160 hour(s))  EKG 12-Lead     Status: None   Collection Time: 03/19/18 11:53 AM  Result Value Ref Range   EKG 12 lead    B Nat Peptide     Status: None   Collection Time: 03/19/18 12:34 PM  Result Value Ref Range   Pro  B Natriuretic peptide (BNP) 61.0 0.0 - 100.0 pg/mL  CBC with Differential/Platelet     Status: None   Collection Time: 03/19/18 12:34 PM  Result Value Ref Range   WBC 8.2 4.0 - 10.5 K/uL   RBC 4.94 4.22 - 5.81 Mil/uL   Hemoglobin 16.3 13.0 - 17.0 g/dL   HCT 47.5 39.0 - 52.0 %   MCV 96.1 78.0 - 100.0 fl   MCHC 34.3 30.0 - 36.0 g/dL   RDW 15.2 11.5 - 15.5 %   Platelets 162.0 150.0 - 400.0 K/uL   Neutrophils Relative % 48.8 43.0 - 77.0 %   Lymphocytes Relative 39.4 12.0 - 46.0 %   Monocytes Relative 9.3 3.0 - 12.0 %   Eosinophils Relative 2.2 0.0 - 5.0 %   Basophils Relative 0.3 0.0 - 3.0 %   Neutro Abs 4.0 1.4 - 7.7 K/uL   Lymphs Abs 3.2 0.7 - 4.0 K/uL   Monocytes Absolute 0.8 0.1 - 1.0 K/uL   Eosinophils Absolute 0.2 0.0 - 0.7 K/uL   Basophils Absolute 0.0 0.0 - 0.1 K/uL  Basic metabolic panel     Status: Abnormal   Collection Time: 03/19/18 12:34 PM  Result Value Ref Range   Sodium 137 135 - 145 mEq/L   Potassium 4.0 3.5 - 5.1 mEq/L   Chloride 100 96 - 112 mEq/L   CO2 28 19 - 32 mEq/L   Glucose, Bld 100 (H) 70 - 99 mg/dL  BUN 24 (H) 6 - 23 mg/dL   Creatinine, Ser 1.12 0.40 - 1.50 mg/dL   Calcium 9.3 8.4 - 10.5 mg/dL   GFR 62.84 >60.00 mL/min    Assessment and Plan: Patient has a tele-visit today for follow-up visit.  He was seen by Dr. Annamaria Boots on 03/19/2018.  Patient had a abnormal heart rhythm at his last visit with Dr. Annamaria Boots.  He was ordered EKG, chest x-ray, and labs.  Dr. Annamaria Boots was concern for congestive heart failure.  He has followed up with his cardiologist since last visit cardiology tapered Toprol 100 mg twice daily down to metoprolol tartrate 25 mg twice daily.  Patient states that overall he has been stable.  He still complains of ongoing nonproductive cough.  He feels that he cannot get a deep enough breath.  Concerned that he may not be getting deep enough breath to get full effect from Anoro Ellipta inhaler.  He states that he has been wheezing occasionally. Patient's  last PFT was in 2015.  We will reorder a PFT at next visit.  Patient is reluctant to change inhalers at this time would like to continue Anoro until we get the results of PFT.  Patient was wheezing over the phone today we will treat with short course of prednisone.  Patient does complain today also clear nasal drainage.  Will order Zyrtec.  Patient Instructions  Will order prednisone Will order Zyrtec daily Continue Anoro Ellipta We will reorder albuterol as needed    Follow Up Instructions:  Follow-up with Dr. Annamaria Boots in 4 months with PFT before appointment. Follow-up sooner if needed.    I discussed the assessment and treatment plan with the patient. The patient was provided an opportunity to ask questions and all were answered. The patient agreed with the plan and demonstrated an understanding of the instructions.   The patient was advised to call back or seek an in-person evaluation if the symptoms worsen or if the condition fails to improve as anticipated.  I provided 22 minutes of non-face-to-face time during this encounter.   Fenton Foy, NP

## 2018-05-07 NOTE — Patient Instructions (Signed)
Will order prednisone Will order Zyrtec daily Continue Anoro Ellipta We will reorder albuterol as needed  Follow-up with Dr. Annamaria Boots in 4 months with PFT before appointment. Follow-up sooner if needed.

## 2018-05-14 ENCOUNTER — Other Ambulatory Visit: Payer: Self-pay | Admitting: Cardiology

## 2018-05-14 ENCOUNTER — Ambulatory Visit: Payer: Medicare Other | Admitting: Internal Medicine

## 2018-05-14 NOTE — Telephone Encounter (Signed)
Refilled lopressor

## 2018-05-18 ENCOUNTER — Other Ambulatory Visit: Payer: Self-pay | Admitting: Cardiovascular Disease

## 2018-05-21 ENCOUNTER — Other Ambulatory Visit: Payer: Self-pay | Admitting: Cardiovascular Disease

## 2018-05-24 NOTE — Telephone Encounter (Signed)
Rx refused. Toprol discontinued at last office visit with Kerin Ransom, [place].

## 2018-05-25 ENCOUNTER — Other Ambulatory Visit: Payer: Self-pay | Admitting: *Deleted

## 2018-05-25 MED ORDER — METOPROLOL SUCCINATE ER 100 MG PO TB24: ORAL_TABLET | ORAL | 3 refills | Status: DC

## 2018-05-26 ENCOUNTER — Telehealth: Payer: Self-pay

## 2018-05-26 ENCOUNTER — Telehealth: Payer: Self-pay | Admitting: Cardiology

## 2018-05-26 NOTE — Telephone Encounter (Signed)
   Cardiac Questionnaire:    Since your last visit or hospitalization:    1. Have you been having new or worsening chest pain? NO    2. Have you been having new or worsening shortness of breath? NO 3. Have you been having new or worsening leg swelling, wt gain, or increase in abdominal girth (pants fitting more tightly)? NO   4. Have you had any passing out spells? NO    *A YES to any of these questions would result in the appointment being kept. *If all the answers to these questions are NO, we should indicate that given the current situation regarding the worldwide coronarvirus pandemic, at the recommendation of the CDC, we are looking to limit gatherings in our waiting area, and thus will reschedule their appointment beyond four weeks from today.   _____________   [id number] Pre-Screening Questions:  . Do you currently have a fever? NO (yes = cancel and refer to pcp for e-visit) . Have you recently travelled on a cruise, internationally, or to [place], [place], [place], [place], [place], or [place], [place] [employer]) ? NO (yes = cancel, stay home, monitor symptoms, and contact pcp or initiate e-visit if symptoms develop) . Have you been in contact with someone that is currently pending confirmation of Covid19 testing or has been confirmed to have the [place] virus?  NO (yes = cancel, stay home, away from tested individual, monitor symptoms, and contact pcp or initiate e-visit if symptoms develop) . Are you currently experiencing fatigue or cough? YES-HAS WITH EXERTIONAL AND THIS IS NOTHING NEW (yes = pt should be prepared to have a mask placed at the time of their visit).

## 2018-05-26 NOTE — Telephone Encounter (Signed)
Mychart, pre reg complete 05/26/18 AF

## 2018-05-26 NOTE — Telephone Encounter (Signed)
Virtual Visit Pre-Appointment Phone Call  Steps For Call:  1. Confirm consent - "In the setting of the current Covid19 crisis, you are scheduled for a VIDEO visit with your provider on 05/31/2018 at 10:00AM.  Just as we do with many in-office visits, in order for you to participate in this visit, we must obtain consent.  If you'd like, I can send this to your mychart (if signed up) or email for you to review.  Otherwise, I can obtain your verbal consent now.  All virtual visits are billed to your insurance company just like a normal visit would be.  By agreeing to a virtual visit, we'd like you to understand that the technology does not allow for your provider to perform an examination, and thus may limit your provider's ability to fully assess your condition.  Finally, though the technology is pretty good, we cannot assure that it will always work on either your or our end, and in the setting of a video visit, we may have to convert it to a phone-only visit.  In either situation, we cannot ensure that we have a secure connection.  Are you willing to proceed?" STAFF: Did the patient verbally acknowledge consent to telehealth visit? Document YES/NO here: YES  2. Confirm the BEST phone number to call the day of the visit by including in appointment notes  3. Give patient instructions for WebEx/MyChart download to smartphone as below or Doximity/Doxy.me if video visit (depending on what platform provider is using)  4. Advise patient to be prepared with their blood pressure, heart rate, weight, any heart rhythm information, their current medicines, and a piece of paper and pen handy for any instructions they may receive the day of their visit  5. Inform patient they will receive a phone call 15 minutes prior to their appointment time (may be from unknown caller ID) so they should be prepared to answer  6. Confirm that appointment type is correct in Epic appointment notes (VIDEO vs PHONE)      TELEPHONE CALL NOTE  Theodore [name] has been deemed a candidate for a follow-up tele-health visit to limit community exposure during the Covid-19 pandemic. I spoke with the patient via phone to ensure availability of phone/video source, confirm preferred email & phone number, and discuss instructions and expectations.  I reminded Theodore [name] to be prepared with any vital sign and/or heart rhythm information that could potentially be obtained via home monitoring, at the time of his visit. I reminded Theodore [name] to expect a phone call at the time of his visit if his visit.  Theodore [name], CMA 05/26/2018 3:21 PM   INSTRUCTIONS FOR DOWNLOADING THE WEBEX APP TO SMARTPHONE  - If Apple, ask patient to go to [employer] and type in WebEx in the search bar. [place] [employer], the blue/green circle. If Android, go to [employer] and type in [employer] in the search bar. The app is free but as with any other app downloads, their phone may require them to verify saved payment information or Apple/Android password.  - The patient does NOT have to create an account. - On the day of the visit, the assist will walk the patient through joining the meeting with the meeting number/password.  INSTRUCTIONS FOR DOWNLOADING THE MYCHART APP TO SMARTPHONE  - The patient must first make sure to have activated MyChart and know their login information - If Apple, go to [employer] and type in [employer]  in the search bar and download the app. If Android, ask patient to go to [employer] and type in [place] in the search bar and download the app. The app is free but as with any other app downloads, their phone may require them to verify saved payment information or Apple/Android password.  - The patient will need to then log into the app with their MyChart username and password, and select [facility] as their healthcare provider to link the account. When it is time for your visit, go to the MyChart  app, find appointments, and click Begin Video Visit. Be sure to Select Allow for your device to access the Microphone and Camera for your visit. You will then be connected, and your provider will be with you shortly.  **If they have any issues connecting, or need assistance please contact MyChart service desk [phone])**  **If using a computer, in order to ensure the best quality for their visit they will need to use either of the following Internet Browsers: [employer], or Google Chrome**  IF USING DOXIMITY or DOXY.ME - The patient will receive a link just prior to their visit, either by text or email (to be determined day of appointment depending on if it's doxy.me or Doximity).     FULL LENGTH CONSENT FOR TELE-HEALTH VISIT   I hereby voluntarily request, consent and authorize [facility] and its employed or contracted physicians, physician assistants, nurse practitioners or other licensed health care professionals (the Practitioner), to provide me with telemedicine health care services (the "Services") as deemed necessary by the treating Practitioner. I acknowledge and consent to receive the Services by the Practitioner via telemedicine. I understand that the telemedicine visit will involve communicating with the Practitioner through live audiovisual communication technology and the disclosure of certain medical information by electronic transmission. I acknowledge that I have been given the opportunity to request an in-person assessment or other available alternative prior to the telemedicine visit and am voluntarily participating in the telemedicine visit.  I understand that I have the right to withhold or withdraw my consent to the use of telemedicine in the course of my care at any time, without affecting my right to future care or treatment, and that the Practitioner or I may terminate the telemedicine visit at any time. I understand that I have the right to inspect all  information obtained and/or recorded in the course of the telemedicine visit and may receive copies of available information for a reasonable fee.  I understand that some of the potential risks of receiving the Services via telemedicine include:  [name] Delay or interruption in medical evaluation due to technological equipment failure or disruption; . Information transmitted may not be sufficient (e.g. poor resolution of images) to allow for appropriate medical decision making by the Practitioner; and/or  . In rare instances, security protocols could fail, causing a breach of personal health information.  Furthermore, I acknowledge that it is my responsibility to provide information about my medical history, conditions and care that is complete and accurate to the best of my ability. I acknowledge that Practitioner's advice, recommendations, and/or decision may be based on factors not within their control, such as incomplete or inaccurate data provided by me or distortions of diagnostic images or specimens that may result from electronic transmissions. I understand that the practice of medicine is not an exact science and that Practitioner makes no warranties or guarantees regarding treatment outcomes. I acknowledge that I will receive a copy of  this consent concurrently upon execution via email to the email address I last provided but may also request a printed copy by calling the office of [facility].    I understand that my insurance will be billed for this visit.   I have read or had this consent read to me. . I understand the contents of this consent, which adequately explains the benefits and risks of the Services being provided via telemedicine.  . I have been provided ample opportunity to ask questions regarding this consent and the Services and have had my questions answered to my satisfaction. . I give my informed consent for the services to be provided through the use of telemedicine in my  medical care  By participating in this telemedicine visit I agree to the above.

## 2018-05-27 ENCOUNTER — Other Ambulatory Visit (HOSPITAL_COMMUNITY): Payer: Medicare Other

## 2018-05-31 ENCOUNTER — Encounter: Payer: Self-pay | Admitting: Cardiology

## 2018-05-31 ENCOUNTER — Telehealth (INDEPENDENT_AMBULATORY_CARE_PROVIDER_SITE_OTHER): Payer: Medicare Other | Admitting: Cardiology

## 2018-05-31 ENCOUNTER — Other Ambulatory Visit: Payer: Self-pay | Admitting: Cardiovascular Disease

## 2018-05-31 ENCOUNTER — Telehealth: Payer: Self-pay

## 2018-05-31 VITALS — BP 130/70 | HR 62 | Ht 71.0 in | Wt 219.0 lb

## 2018-05-31 DIAGNOSIS — R059 Cough, unspecified: Secondary | ICD-10-CM

## 2018-05-31 DIAGNOSIS — I2510 Atherosclerotic heart disease of native coronary artery without angina pectoris: Secondary | ICD-10-CM | POA: Diagnosis not present

## 2018-05-31 DIAGNOSIS — J449 Chronic obstructive pulmonary disease, unspecified: Secondary | ICD-10-CM

## 2018-05-31 DIAGNOSIS — Z86711 Personal history of pulmonary embolism: Secondary | ICD-10-CM

## 2018-05-31 DIAGNOSIS — K219 Gastro-esophageal reflux disease without esophagitis: Secondary | ICD-10-CM

## 2018-05-31 DIAGNOSIS — R05 Cough: Secondary | ICD-10-CM

## 2018-05-31 DIAGNOSIS — I480 Paroxysmal atrial fibrillation: Secondary | ICD-10-CM

## 2018-05-31 DIAGNOSIS — I10 Essential (primary) hypertension: Secondary | ICD-10-CM | POA: Diagnosis not present

## 2018-05-31 DIAGNOSIS — I493 Ventricular premature depolarization: Secondary | ICD-10-CM

## 2018-05-31 NOTE — Telephone Encounter (Signed)
Zetia 10 mg refilled. 

## 2018-05-31 NOTE — Progress Notes (Signed)
Virtual Visit via Video Note   This visit type was conducted due to national recommendations for restrictions regarding the COVID-19 Pandemic (e.g. social distancing) in an effort to limit this patient's exposure and mitigate transmission in our community.  Due to his co-morbid illnesses, this patient is at least at moderate risk for complications without adequate follow up.  This format is felt to be most appropriate for this patient at this time.  All issues noted in this document were discussed and addressed.  A limited physical exam was performed with this format.  Please refer to the patient's chart for his consent to telehealth for [facility].  Evaluation Performed:  Follow-up visit  This visit type was conducted due to national recommendations for restrictions regarding the COVID-19 Pandemic (e.g. social distancing).  This format is felt to be most appropriate for this patient at this time.  All issues noted in this document were discussed and addressed.  No physical exam was performed (except for noted visual exam findings with Video Visits).  Please refer to the patient's chart (MyChart message for video visits and phone note for telephone visits) for the patient's consent to telehealth for [facility].  Date:  05/31/2018   ID:  Theodore [name], DOB 1935/01/11, MRN [id number]  Patient Location: home [address] [place] [address]   Provider location:   [place]  PCP:  [name], MD  Cardiologist:  Skeet Latch, MD  Electrophysiologist:  Constance Haw, MD   Chief Complaint:  cough  History of Present Illness:    Theodore [name] is a 84 y.o. male who presents via audio/video conferencing for a telehealth visit today.    Theodore [name] is a pleasant 84 year old male with a complicated medical history.  He has a history of coronary disease that we have treated medically.  Catheterization in 2016 showed a 70% LAD.  Catheterization again in 2018  showed a 70% LAD with a negative FFR.  His ejection fraction in 2016 by echo was 55 to 60%.  In August 2017 he had atrial fibrillation and his ejection fraction dropped to 35 to 45%.  He underwent DC cardioversion in September 2017 and ultimately an ablation in November 2017.  He has been holding sinus rhythm since though he has frequent PVCs.  In December 2018 he had a knee replacement.  By this time the patient had been on Xarelto.  This was held for couple days post op because of some bleeding issues and he developed a pulmonary embolism.  His ejection fraction in January 2019 had improved to 50 to 55%.  Theodore [name] says he developed "pneumonia" in November 2019.  Since then he has had a problem with cough.  He denies any orthopnea.  He says his cough is not worse at night and he denies any reflux symptoms.  He is on a PPI.  He says his cough is mainly during the day with exertion, like taking a shower or going for a walk.  He denies any lower extremity edema.  He had been seen in the office in March.  He had active wheezing on exam.  He does see Dr. Annamaria Boots for asthmatic COPD.  The patient was put on a steroid Dosepak but he tells me it did not really help much.  He was again seen as a virtual follow-up a few weeks ago and he says he was put on another short course of steroids.  He tells me once again he does  not think the steroids helped him.  He denies any exertional chest pain or tightness.  He is quite concerned about his symptoms, he says up until his pneumonia in November that he was out playing golf and being active.  He feels like physically cannot do any of that now.  He is staying at home and observing strict social distancing and [place] VID precautions.  He denies any fever or chills.  He was set up to have PFTs but apparently this was put off indefinitely because of the current pandemic.  I suggested to Theodore [name] that we will try and contact the pulmonary department and get his PFTs done.  If his PFTs do  not reveal a significant cause for his symptoms I think we may need to look again for a possible ischemic cause.  I would discuss possible right and left heart cath with his primary cardiologist if his PFTs are unrevealing.  I will be in contact with Theodore [name] once we have his PFT results.  The patient does not symptoms concerning for COVID-19 infection (fever, chills, cough, or new SHORTNESS OF BREATH).    Prior CV studies:   The following studies were reviewed today:  Past Medical History:  Diagnosis Date   History of arthritis    History of rheumatic fever    Hypercholesterolemia    Hypertension    Nocturia    PAF (paroxysmal atrial fibrillation) [facility]) January 2013   Placed on Pradaxa. Did not require cardioversion; spontaneously converted   Peripheral edema    Right bundle branch block    SOB (shortness of breath)    Tendonitis of elbow, left    Past Surgical History:  Procedure Laterality Date   ATRIAL FIBRILLATION ABLATION  12/19/2015   BACK SURGERY  12/24/09   fusion C3-C4   BACK SURGERY  2010   fusion L4-L5   CARDIAC CATHETERIZATION  2009   NONOBSTRUCTIVE ATHERSCLEROTIC CORONARY DISEASE AND NORMAL  LV FUNCTION   CARDIAC CATHETERIZATION N/A 09/08/2014   Procedure: Left Heart Cath and Coronary Angiography;  Surgeon: Belva Crome, MD;  Location: [place] CV LAB;  Service: Cardiovascular;  Laterality: N/A;   CARDIAC CATHETERIZATION N/A 09/28/2015   Procedure: Left Heart Cath and Coronary Angiography;  Surgeon: Leonie Man, MD;  Location: [place] CV LAB;  Service: Cardiovascular;  Laterality: N/A;   CARDIAC CATHETERIZATION N/A 09/28/2015   Procedure: Intravascular Pressure Wire/FFR Study;  Surgeon: Leonie Man, MD;  Location: [place] CV LAB;  Service: Cardiovascular;  Laterality: N/A;   CARDIOVERSION N/A 10/23/2015   Procedure: CARDIOVERSION;  Surgeon: [name], MD;  Location[facility];  Service: Cardiovascular;  Laterality: N/A;     COLONOSCOPY  2013   per patient, rpt 5 yrs   ELECTROPHYSIOLOGIC STUDY N/A 12/19/2015   Procedure: Atrial Fibrillation Ablation;  Surgeon: [name], MD;  Location: [facility] CV LAB;  Service: Cardiovascular;  Laterality: N/A;   KNEE ARTHROSCOPY Left 03/2016   Dr. [name]   PATELLAR TENDON REPAIR Left 2008   REPLACEMENT TOTAL KNEE Right 2006   TOTAL KNEE ARTHROPLASTY Left 02/09/2017   Procedure: LEFT TOTAL KNEE ARTHROPLASTY, EXCISION LEFT DISTAL THIGH MASS;  Surgeon: [name], MD;  Location: [facility] ORS;  Service: Orthopedics;  Laterality: Left;  90 mins   TRICEPS TENDON REPAIR Left 2013     Current Meds  Medication Sig   acetaminophen (TYLENOL) 500 MG tablet Take 1 tablet (500 mg total) by mouth 3 (three) times daily as  needed.   albuterol (PROAIR HFA) 108 (90 Base) MCG/ACT inhaler Inhale 2 puffs into the lungs every 6 (six) hours as needed for up to 30 days for wheezing or shortness of breath.   Ascorbic Acid (VITAMIN C PO) Take 1 tablet by mouth daily.    atorvastatin (LIPITOR) 40 MG tablet TAKE 1 TABLET DAILY   Cholecalciferol (VITAMIN D3) 2000 units TABS Take 2,000 Units by mouth daily.   ezetimibe (ZETIA) 10 MG tablet TAKE 1 TABLET DAILY   furosemide (LASIX) 40 MG tablet TAKE 1 TABLET DAILY (NEW DOSE)   losartan (COZAAR) 100 MG tablet Take 100 mg by mouth once daily   LYSINE PO Take 1 tablet by mouth daily.    metoprolol succinate (TOPROL-XL) 100 MG 24 hr tablet 1 daily   omeprazole (PRILOSEC) 20 MG capsule Take 20 mg by mouth daily.     potassium chloride (K-DUR,KLOR-CON) 10 MEQ tablet TAKE 1 TABLET TWICE A DAY   umeclidinium-vilanterol (ANORO ELLIPTA) 62.5-25 MCG/INH AEPB Inhale 1 puff into the lungs daily.   XARELTO 20 MG TABS tablet TAKE 1 TABLET DAILY WITH SUPPER     Allergies:   Penicillins; Amlodipine; Lisinopril; Mevacor [lovastatin]; Vicodin [hydrocodone-acetaminophen]; Zocor [simvastatin]; and Oseltamivir phosphate   Social History    Tobacco Use   Smoking status: Never Smoker   Smokeless tobacco: Never Used  Substance Use Topics   Alcohol use: Yes    Alcohol/week: 14.0 standard drinks    Types: 14 Cans of beer per week    Comment: 2 beers daily   Drug use: No     Family Hx: The patient's family history includes Asthma in his sister; COPD in his brother and father; Heart attack in his father; Heart disease in his brother; Hypertension in his father. There is no history of Stroke.  ROS:   Please see the history of present illness.    All other systems reviewed and are negative.   Labs/Other Tests and Data Reviewed:    Recent Labs: 01/12/2018: ALT 19 03/19/2018: BUN 24; Creatinine, Ser 1.12; Hemoglobin 16.3; Platelets 162.0; Potassium 4.0; Pro B Natriuretic peptide (BNP) 61.0; Sodium 137   Recent Lipid Panel Lab Results  Component Value Date/Time   CHOL 121 01/12/2018 08:04 AM   CHOL 121 07/29/2016 08:22 AM   TRIG 80.0 01/12/2018 08:04 AM   HDL 44.30 01/12/2018 08:04 AM   HDL 56 07/29/2016 08:22 AM   CHOLHDL 3 01/12/2018 08:04 AM   LDLCALC 61 01/12/2018 08:04 AM   LDLCALC 56 07/29/2016 08:22 AM    Wt Readings from Last 3 Encounters:  05/31/18 219 lb (99.3 kg)  04/21/18 219 lb (99.3 kg)  03/23/18 219 lb (99.3 kg)     Exam:    Vital Signs:  BP 130/70    Pulse 62    Ht 5\' 11"  (1.803 m)    Wt 219 lb (99.3 kg)    BMI 30.54 kg/m    overweight, well developed male in no acute distress. No obvious cyanosis   ASSESSMENT & PLAN:    Cough Persistent cough- he does not appear to be in CHF on exam and BNP was normal and EF 50% by echo  COPD mixed type ([place]) H/O moderate COPD-beta blocker tapered down last OV  Paroxysmal atrial fibrillation (HCC) NSR todaywith PVCs  Right bundle branch block Chronic  CAD (coronary artery disease), native coronary artery Known 70% LAD (cath 2017 and 2018) with negative Myoview and negative FFR 2018.     Nonischemic cardiomyopathy ([place])  Last EF50%  03/25/2018  S/P left TKA- S/P LTKR 02/09/17 with post op bleeding/. Xareltoheld-subsequent PE  Pulmonary embolism 03/03/17-post op knee surgery  Chronic anticoagulation CHADS VASC=5  COVID-19 Education: The signs and symptoms of COVID-19 were discussed with the patient and how to seek care for testing (follow up with PCP or arrange E-visit).  The importance of social distancing was discussed today.  Patient Risk:   After full review of this patients clinical status, I feel that they are at least moderate risk at this time.  Time:   Today, I have spent 25 minutes with the patient with telehealth technology discussing cough and treatment.     Medication Adjustments/Labs and Tests Ordered: Current medicines are reviewed at length with the patient today.  Concerns regarding medicines are outlined above.  Tests Ordered: No orders of the defined types were placed in this encounter.  Medication Changes: No orders of the defined types were placed in this encounter.   Disposition:  Dr [name] or myself after his PFTs are done  Signed, Kerin Ransom, Hershal Coria  05/31/2018 10:56 AM    [place]

## 2018-05-31 NOTE — Progress Notes (Signed)
Re your note on [place], no spirometry or full PFTs are available now, due to the risk of spreading virus with forced exhalation.

## 2018-05-31 NOTE — Telephone Encounter (Signed)
Contacted patient to give AVS Instructions.-advised patient that I would reach out to Dr Janee Morn office and see how soon the could the patient have his PFT's Lurena Joiner does not want the patient to have to wait until August.

## 2018-05-31 NOTE — Patient Instructions (Signed)
Medication Instructions:  Your physician recommends that you continue on your current medications as directed. Please refer to the Current Medication list given to you today.  If you need a refill on your cardiac medications before your next appointment, please call your pharmacy.   Lab work: None  If you have labs (blood work) drawn today and your tests are completely normal, you will receive your results only by: [name] MyChart Message (if you have MyChart) OR . A paper copy in the mail If you have any lab test that is abnormal or we need to change your treatment, we will call you to review the results.  Testing/Procedures: Needs PFT scheduled  Follow-Up: At [facility], you and your health needs are our priority.  As part of our continuing mission to provide you with exceptional heart care, we have created designated Provider Care Teams.  These Care Teams include your primary Cardiologist (physician) and Advanced Practice Providers (APPs -  Physician Assistants and Nurse Practitioners) who all work together to provide you with the care you need, when you need it. You will need a follow up appointment AFTER PFT's HAVE BEEN COMPLETED.  You may see Skeet Latch, MD or one of the following Advanced Practice Providers on your designated Care Team:   Kerin Ransom, PA-C Roby Lofts, [place] . Sande Rives, PA-C  Any Other Special Instructions Will Be Listed Below (If Applicable).

## 2018-05-31 NOTE — Telephone Encounter (Signed)
Spoke with Dr Janee Morn office and the receptionist and she stated their office is not seeing patients this unless its a urgent need. She stated they will not be scheduling patients in the office until July.

## 2018-06-01 NOTE — Telephone Encounter (Signed)
I spoke with Dr Annamaria Boots- I'll call Mr Jaleen Finch [facility] PA-C 06/01/2018 8:09 AM

## 2018-06-01 NOTE — Telephone Encounter (Signed)
Ok [place]

## 2018-06-01 NOTE — Progress Notes (Signed)
Thanks Dr Annamaria Boots- I'll speak to Mr Theodore [name]

## 2018-06-02 ENCOUNTER — Telehealth: Payer: Self-pay | Admitting: Cardiology

## 2018-06-02 NOTE — Telephone Encounter (Signed)
I spoke with Theodore [name] on the phone and explained that currently spirometry and PFT testing is not available secondary to the concern for spreading COVID-19.  His symptoms have been stable, no real change for the past two months.  He knows to contact us if he develops increasing dyspnea and I encouraged him to call, or if needed, go to the ED if he symptoms become severe.  Kerin Ransom PA-C 06/02/2018 9:57 AM

## 2018-06-24 ENCOUNTER — Other Ambulatory Visit (HOSPITAL_COMMUNITY): Payer: Medicare Other

## 2018-06-24 ENCOUNTER — Other Ambulatory Visit: Payer: Self-pay | Admitting: Cardiology

## 2018-06-24 DIAGNOSIS — I77819 Aortic ectasia, unspecified site: Secondary | ICD-10-CM

## 2018-06-29 ENCOUNTER — Other Ambulatory Visit: Payer: Self-pay | Admitting: Internal Medicine

## 2018-07-01 NOTE — Telephone Encounter (Signed)
Dr Annamaria Boots, I had received this message in My Chart and wanted to get your recommendations.

## 2018-08-06 ENCOUNTER — Ambulatory Visit (INDEPENDENT_AMBULATORY_CARE_PROVIDER_SITE_OTHER): Payer: Medicare Other

## 2018-08-06 DIAGNOSIS — Z0000 Encounter for general adult medical examination without abnormal findings: Secondary | ICD-10-CM | POA: Diagnosis not present

## 2018-08-06 NOTE — Patient Instructions (Signed)
Theodore [name] , Thank you for taking time to come for your Medicare Wellness Visit. I appreciate your ongoing commitment to your health goals. Please review the following plan we discussed and let me know if I can assist you in the future.   These are the goals we discussed: Goals    . Patient Stated     Starting 08/06/18, I will continue to take medications as prescribed.        This is a list of the screening recommended for you and due dates:  Health Maintenance  Topic Date Due  . DTaP/Tdap/Td vaccine (1 - Tdap) 02/10/2020*  . Tetanus Vaccine  02/10/2020*  . Flu Shot  09/11/2018  . Pneumonia vaccines  Completed  *Topic was postponed. The date shown is not the original due date.   Preventive Care for Adults  A healthy lifestyle and preventive care can promote health and wellness. Preventive health guidelines for adults include the following key practices.  . A routine yearly physical is a good way to check with your health care provider about your health and preventive screening. It is a chance to share any concerns and updates on your health and to receive a thorough exam.  . Visit your dentist for a routine exam and preventive care every 6 months. Brush your teeth twice a day and floss once a day. Good oral hygiene prevents tooth decay and gum disease.  . The frequency of eye exams is based on your age, health, family medical history, use  of contact lenses, and other factors. Follow your health care provider's recommendations for frequency of eye exams.  . Eat a healthy diet. Foods like vegetables, fruits, whole grains, low-fat dairy products, and lean protein foods contain the nutrients you need without too many calories. Decrease your intake of foods high in solid fats, added sugars, and salt. Eat the right amount of calories for you. Get information about a proper diet from your health care provider, if necessary.  . Regular physical exercise is one of the most important things  you can do for your health. Most adults should get at least 150 minutes of moderate-intensity exercise (any activity that increases your heart rate and causes you to sweat) each week. In addition, most adults need muscle-strengthening exercises on 2 or more days a week.  Silver Sneakers may be a benefit available to you. To determine eligibility, you may visit the website: www.silversneakers.com or contact program at (419)724-9110 Mon-Fri between 8AM-8PM.   . Maintain a healthy weight. The body mass index (BMI) is a screening tool to identify possible weight problems. It provides an estimate of body fat based on height and weight. Your health care provider can find your BMI and can help you achieve or maintain a healthy weight.   For adults 20 years and older: ? A BMI below 18.5 is considered underweight. ? A BMI of 18.5 to 24.9 is normal. ? A BMI of 25 to 29.9 is considered overweight. ? A BMI of 30 and above is considered obese.   . Maintain normal blood lipids and cholesterol levels by exercising and minimizing your intake of saturated fat. Eat a balanced diet with plenty of fruit and vegetables. Blood tests for lipids and cholesterol should begin at age 68 and be repeated every 5 years. If your lipid or cholesterol levels are high, you are over 50, or you are at high risk for heart disease, you may need your cholesterol levels checked more frequently. Ongoing high lipid  and cholesterol levels should be treated with medicines if diet and exercise are not working.  . If you smoke, find out from your health care provider how to quit. If you do not use tobacco, please do not start.  . If you choose to drink alcohol, please do not consume more than 2 drinks per day. One drink is considered to be 12 ounces (355 mL) of beer, 5 ounces (148 mL) of wine, or 1.5 ounces (44 mL) of liquor.  . If you are 62-24 years old, ask your health care provider if you should take aspirin to prevent strokes.  . Use  sunscreen. Apply sunscreen liberally and repeatedly throughout the day. You should seek shade when your shadow is shorter than you. Protect yourself by wearing long sleeves, pants, a wide-brimmed hat, and sunglasses year round, whenever you are outdoors.  . Once a month, do a whole body skin exam, using a mirror to look at the skin on your back. Tell your health care provider of new moles, moles that have irregular borders, moles that are larger than a pencil eraser, or moles that have changed in shape or color.

## 2018-08-06 NOTE — Progress Notes (Signed)
Subjective:   Theodore [name] is a 84 y.o. male who presents for Medicare Annual/Subsequent preventive examination.  Review of Systems:  N/A Cardiac Risk Factors include: advanced age (>6men, >35 women);dyslipidemia;hypertension;male gender;obesity (BMI >30kg/m2)     Objective:    Vitals: There were no vitals taken for this visit.  There is no height or weight on file to calculate BMI.  Advanced Directives 08/06/2018 07/23/2017 03/03/2017 03/03/2017 02/13/2017 02/09/2017 01/28/2017  Does Patient Have a Medical Advance Directive? Yes Yes Yes Yes Yes Yes Yes  Type of Paramedic of [place];Living will Living will;Healthcare Power of [place];Living will Healthcare Power of [place] will Living will  Does patient want to make changes to medical advance directive? No - Patient declined - No - Patient declined - No - Patient declined No - Patient declined No - Patient declined  Copy of [place] in Chart? Yes - validated most recent copy scanned in chart (See row information) Yes No - copy requested - No - copy requested - -    Tobacco Social History   Tobacco Use  Smoking Status Never Smoker  Smokeless Tobacco Never Used     Counseling given: No   Clinical Intake:  Pre-visit preparation completed: Yes  Pain : No/denies pain Pain Score: 0-No pain     Nutritional Status: BMI > 30  Obese Nutritional Risks: None  How often do you need to have someone help you when you read instructions, pamphlets, or other written materials from your doctor or pharmacy?: 1 - Never What is the last grade level you completed in school?: Master degree  Interpreter Needed?: No  Comments: pt lives with spouse Information entered by :: LPinson, RN  Past Medical History:  Diagnosis Date  . History of arthritis   . History of rheumatic fever   . Hypercholesterolemia   . Hypertension   .  Nocturia   . PAF (paroxysmal atrial fibrillation) [facility]) January 2013   Placed on Pradaxa. Did not require cardioversion; spontaneously converted  . Peripheral edema   . Right bundle branch block   . SOB (shortness of breath)   . Tendonitis of elbow, left    Past Surgical History:  Procedure Laterality Date  . ATRIAL FIBRILLATION ABLATION  12/19/2015  . BACK SURGERY  12/24/09   fusion C3-C4  . BACK SURGERY  2010   fusion L4-L5  . CARDIAC CATHETERIZATION  2009   NONOBSTRUCTIVE ATHERSCLEROTIC CORONARY DISEASE AND NORMAL  LV FUNCTION  . CARDIAC CATHETERIZATION N/A 09/08/2014   Procedure: Left Heart Cath and Coronary Angiography;  Surgeon: Belva Crome, MD;  Location: [place] [facility];  Service: Cardiovascular;  Laterality: N/A;  . CARDIAC CATHETERIZATION N/A 09/28/2015   Procedure: Left Heart Cath and Coronary Angiography;  Surgeon: Leonie Man, MD;  Location: [place] [facility];  Service: Cardiovascular;  Laterality: N/A;  . CARDIAC CATHETERIZATION N/A 09/28/2015   Procedure: Intravascular Pressure Wire/FFR Study;  Surgeon: Leonie Man, MD;  Location: [place] [facility];  Service: Cardiovascular;  Laterality: N/A;  . CARDIOVERSION N/A 10/23/2015   Procedure: CARDIOVERSION;  Surgeon: [name], MD;  Location: [facility];  Service: Cardiovascular;  Laterality: N/A;  . COLONOSCOPY  2013   per patient, rpt 5 yrs  . ELECTROPHYSIOLOGIC STUDY N/A 12/19/2015   Procedure: Atrial Fibrillation Ablation;  Surgeon: [name], MD;  Location: [place] [facility];  Service: Cardiovascular;  Laterality: N/A;  .  KNEE ARTHROSCOPY Left 03/2016   Dr. [name]  . PATELLAR TENDON REPAIR Left 2008  . REPLACEMENT TOTAL KNEE Right 2006  . TOTAL KNEE ARTHROPLASTY Left 02/09/2017   Procedure: LEFT TOTAL KNEE ARTHROPLASTY, EXCISION LEFT DISTAL THIGH MASS;  Surgeon: [name], MD;  Location: [facility];  Service: Orthopedics;  Laterality: Left;  90 mins  . TRICEPS TENDON REPAIR Left 2013    Family History  Problem Relation Age of Onset  . Heart attack Father   . COPD Father   . Hypertension Father   . Heart disease Brother   . COPD Brother   . Asthma Sister   . Stroke Neg Hx    Social History   Socioeconomic History  . Marital status: Married    Spouse name: Not on file  . Number of children: Not on file  . Years of education: Not on file  . Highest education level: Not on file  Occupational History  . 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 Financial resource strain: Not on file  . Food insecurity    Worry: Not on file    Inability: Not on file  . Transportation needs    Medical: Not on file    Non-medical: Not on file  Tobacco Use  . Smoking status: Never Smoker  . Smokeless tobacco: Never Used  Substance and Sexual Activity  . Alcohol use: Yes    Alcohol/week: 14.0 standard drinks    Types: 14 Cans of beer per week    Comment: 2 beers daily  . Drug use: No  . Sexual activity: Not Currently  Lifestyle  . Physical activity    Days per week: Not on file    Minutes per session: Not on file  . Stress: Not on file  Relationships  . Social Herbalist on phone: Not on file    Gets together: Not on file    Attends religious service: Not on file    Active member of club or organization: Not on file    Attends meetings of clubs or organizations: Not on file    Relationship status: Not on file  Other Topics Concern  . Not on file  Social History Narrative   Lives with wife, no pets   Retired   Occ: Art gallery manager   Edu: master's   Activity: golf   Diet: good water, fruits/vegetables daily    Outpatient Encounter Medications as of 08/06/2018  Medication Sig  . acetaminophen (TYLENOL) 500 MG tablet Take 1 tablet (500 mg total) by mouth 3 (three) times daily as needed.  Jearl Klinefelter ELLIPTA 62.5-25 MCG/INH AEPB USE 1 INHALATION DAILY  . Ascorbic Acid (VITAMIN C PO) Take 1 tablet by mouth  daily.   [name] atorvastatin (LIPITOR) 40 MG tablet TAKE 1 TABLET DAILY  . Cholecalciferol (VITAMIN D3) 2000 units TABS Take 2,000 Units by mouth daily.  [name] ezetimibe (ZETIA) 10 MG tablet TAKE 1 TABLET DAILY  . furosemide (LASIX) 40 MG tablet TAKE 1 TABLET DAILY (NEW DOSE)  . losartan (COZAAR) 100 MG tablet Take 100 mg by mouth once daily  . LYSINE PO Take 1 tablet by mouth daily.   [name] omeprazole (PRILOSEC) 20 MG capsule Take 20 mg by mouth daily.    . potassium chloride (K-DUR,KLOR-CON) 10 MEQ tablet TAKE 1 TABLET TWICE A DAY  . XARELTO 20 MG TABS tablet TAKE 1 TABLET DAILY WITH SUPPER  . albuterol (  PROAIR HFA) 108 (90 Base) MCG/ACT inhaler Inhale 2 puffs into the lungs every 6 (six) hours as needed for up to 30 days for wheezing or shortness of breath.  . metoprolol succinate (TOPROL-XL) 100 MG 24 hr tablet 1 daily   No facility-administered encounter medications on file as of 08/06/2018.     Activities of Daily Living In your present state of health, do you have any difficulty performing the following activities: 08/06/2018  Hearing? N  Vision? N  Difficulty concentrating or making decisions? N  Walking or climbing stairs? Y  Dressing or bathing? N  Doing errands, shopping? N  Preparing Food and eating ? N  Using the Toilet? N  Managing your Medications? N  Managing your Finances? N  Housekeeping or managing your Housekeeping? N  Some recent data might be hidden    Patient Care Team: [name], MD as PCP - General (Family Medicine) Constance Haw, MD as PCP - Electrophysiology (Cardiology) Skeet Latch, MD as PCP - Cardiology (Cardiology) Skeet Latch, MD as Attending Physician (Cardiology) Druscilla Brownie, MD as Consulting Physician (Dermatology) Deneise Lever, MD as Consulting Physician (Pulmonary Disease) Estill Cotta, MD as Referring Physician (Ophthalmology)   Assessment:   This is a routine wellness examination for Pacer.   Hearing  Screening   125Hz  250Hz  500Hz  1000Hz  2000Hz  3000Hz  4000Hz  6000Hz  8000Hz   Right ear:           Left ear:           Vision Screening Comments: Vision exam in Feb 2020 with Dr. Sandra Cockayne   Exercise Activities and Dietary recommendations Current Exercise Habits: Home exercise routine, Type of exercise: walking(golf), Time (Minutes): 30, Frequency (Times/Week): 2, Weekly Exercise (Minutes/Week): 60, Intensity: Mild, Exercise limited by: None identified  Goals    . Patient Stated     Starting 08/06/18, I will continue to take medications as prescribed.        Fall Risk Fall Risk  08/06/2018 07/23/2017 01/13/2017 07/17/2016 02/06/2016  Falls in the past year? 0 No No No No   Depression Screen PHQ 2/9 Scores 08/06/2018 07/23/2017 01/13/2017 07/17/2016  PHQ - 2 Score 0 0 0 0  PHQ- 9 Score 0 0 - -    Cognitive Function MMSE - Mini Mental State Exam 08/06/2018 07/23/2017 07/17/2016 08/07/2015  Orientation to time 5 5 5 5   Orientation to Place 5 5 5 5   Registration 3 3 3 3   Attention/ Calculation 0 0 0 0  Recall 3 3 3 3   Language- name 2 objects 0 0 0 0  Language- repeat 1 1 1 1   Language- follow 3 step command 0 3 3 3   Language- read & follow direction 0 0 0 0  Write a sentence 0 0 0 0  Copy design 0 0 0 0  Total score 17 20 20 20      PLEASE NOTE: A Mini-Cog screen was completed. Maximum score is 17. A value of 0 denotes this part of [place] MMSE was not completed or the patient failed this part of the Mini-Cog screening.   Mini-Cog Screening Orientation to Time - Max 5 pts Orientation to Place - Max 5 pts Registration - Max 3 pts Recall - Max 3 pts Language Repeat - Max 1 pts      Immunization History  Administered Date(s) Administered  . Influenza Split 11/11/2010, 11/11/2011, 11/23/2012  . Influenza Whole 11/10/2009  . Influenza, High Dose Seasonal PF 11/17/2016, 10/22/2017  . Influenza,inj,Quad PF,6+ Mos 10/25/2015  .  Influenza,inj,quad, With Preservative 10/13/2016  .  Influenza-Unspecified 11/10/2013, 10/23/2014  . Pneumococcal Conjugate-13 10/23/2014  . Pneumococcal Polysaccharide-23 11/10/2009  . Zoster 02/11/2012    Screening Tests Health Maintenance  Topic Date Due  . DTaP/Tdap/Td (1 - Tdap) 02/10/2020 (Originally 09/12/1953)  . TETANUS/TDAP  02/10/2020 (Originally 09/12/1953)  . INFLUENZA VACCINE  09/11/2018  . PNA vac Low Risk Adult  Completed     Plan:     I have personally reviewed, addressed, and noted the following in the patient's chart:  A. Medical and social history B. Use of alcohol, tobacco or illicit drugs  C. Current medications and supplements D. Functional ability and status E.  Nutritional status F.  Physical activity G. Advance directives H. List of other physicians I.  Hospitalizations, surgeries, and ER visits in previous 12 months J.  Vitals (unless it is a telemedicine encounter) K. Screenings to include cognitive, depression, hearing, vision (NOTE: hearing and vision screenings not completed in telemedicine encounter) L. Referrals and appointments   In addition, I have reviewed and discussed with patient certain preventive protocols, quality metrics, and best practice recommendations. A written personalized care plan for preventive services and recommendations were provided to patient.  With patient's permission, we connected on 08/06/18 at  8:30 AM EDT. Interactive audio and video telecommunications were attempted with patient. This attempt was unsuccessful due to patient having technical difficulties OR patient did not have access to video capability.  Encounter was completed with audio only.  Two patient identifiers were used to ensure the encounter occurred with the correct person. Patient was in home and writer was in office.     Signed,   Lindell Noe, MHA, BS, RN Health Coach

## 2018-08-06 NOTE — Progress Notes (Signed)
PCP notes:   Health maintenance:  Tetanus vaccine - postponed/insurance  Abnormal screenings:   None  Patient concerns:   None  Nurse concerns:  None  Next PCP appt:   08/10/18 @ 0800

## 2018-08-09 NOTE — Progress Notes (Signed)
I reviewed health advisor's note, was available for consultation, and agree with documentation and plan.  

## 2018-08-10 ENCOUNTER — Encounter: Payer: Self-pay | Admitting: Family Medicine

## 2018-08-10 ENCOUNTER — Ambulatory Visit: Payer: Medicare Other | Admitting: Family Medicine

## 2018-08-10 ENCOUNTER — Ambulatory Visit: Payer: Medicare Other

## 2018-08-10 ENCOUNTER — Other Ambulatory Visit: Payer: Self-pay

## 2018-08-10 VITALS — BP 134/72 | HR 74 | Temp 97.6°F | Ht 70.0 in | Wt 220.0 lb

## 2018-08-10 DIAGNOSIS — R05 Cough: Secondary | ICD-10-CM | POA: Diagnosis not present

## 2018-08-10 DIAGNOSIS — R5383 Other fatigue: Secondary | ICD-10-CM

## 2018-08-10 DIAGNOSIS — R053 Chronic cough: Secondary | ICD-10-CM

## 2018-08-10 LAB — CBC WITH DIFFERENTIAL/PLATELET
Basophils Absolute: 0 10*3/uL (ref 0.0–0.1)
Basophils Relative: 0.3 % (ref 0.0–3.0)
Eosinophils Absolute: 0.1 10*3/uL (ref 0.0–0.7)
Eosinophils Relative: 1.1 % (ref 0.0–5.0)
HCT: 46.4 % (ref 39.0–52.0)
Hemoglobin: 15.9 g/dL (ref 13.0–17.0)
Lymphocytes Relative: 32 % (ref 12.0–46.0)
Lymphs Abs: 2.3 10*3/uL (ref 0.7–4.0)
MCHC: 34.2 g/dL (ref 30.0–36.0)
MCV: 99.6 fl (ref 78.0–100.0)
Monocytes Absolute: 0.7 10*3/uL (ref 0.1–1.0)
Monocytes Relative: 9.8 % (ref 3.0–12.0)
Neutro Abs: 4.1 10*3/uL (ref 1.4–7.7)
Neutrophils Relative %: 56.8 % (ref 43.0–77.0)
Platelets: 144 10*3/uL — ABNORMAL LOW (ref 150.0–400.0)
RBC: 4.66 Mil/uL (ref 4.22–5.81)
RDW: 13.6 % (ref 11.5–15.5)
WBC: 7.3 10*3/uL (ref 4.0–10.5)

## 2018-08-10 LAB — COMPREHENSIVE METABOLIC PANEL
ALT: 18 U/L (ref 0–53)
AST: 19 U/L (ref 0–37)
Albumin: 4.4 g/dL (ref 3.5–5.2)
Alkaline Phosphatase: 66 U/L (ref 39–117)
BUN: 17 mg/dL (ref 6–23)
CO2: 29 mEq/L (ref 19–32)
Calcium: 9.4 mg/dL (ref 8.4–10.5)
Chloride: 99 mEq/L (ref 96–112)
Creatinine, Ser: 0.98 mg/dL (ref 0.40–1.50)
GFR: 73.24 mL/min (ref >60.00)
Glucose, Bld: 110 mg/dL — ABNORMAL HIGH (ref 70–99)
Potassium: 4.7 mEq/L (ref 3.5–5.1)
Sodium: 136 mEq/L (ref 135–145)
Total Bilirubin: 0.7 mg/dL (ref 0.2–1.2)
Total Protein: 6.7 g/dL (ref 6.0–8.3)

## 2018-08-10 LAB — VITAMIN B12: Vitamin B-12: 680 pg/mL (ref 211–911)

## 2018-08-10 LAB — TSH: TSH: 1.63 u[IU]/mL (ref 0.35–4.50)

## 2018-08-10 NOTE — Progress Notes (Signed)
This visit was conducted in person.  BP 134/72 (BP Location: Right Arm, Patient Position: Sitting, Cuff Size: Normal)   Pulse 74   Temp 97.6 F (36.4 C) (Temporal)   Ht 5\' 10"  (1.778 m)   Wt 220 lb (99.8 kg)   SpO2 97%   BMI 31.57 kg/m    CC: 6 mo f/u visit Subjective:    Patient ID: Theodore [name], male    DOB: January 17, 1935, 84 y.o.   MRN: [id number]  HPI: Theodore [name] is a 84 y.o. male presenting on 08/10/2018 for Follow-up (Here for 6 mo f/u.)   Saw Theodore [name] last week for medicare wellness visit. Note reviewed.   Gets CPE and AMW split up every 6 months.   New great grandbaby!  Tough year - knee replacement then PE then PNA. Progressive productive cough of white sputum since PNA, thought to be CHF related by pulm Annamaria Boots), but cards thought it was pulmonary issue. No cough at night, notes cough more present during the day. Noticing nagging upper back ache as well as ribcage discomfort. Upcoming AAA duplex this Thursday. Ongoing fatigue over this past year.   Denies dyspnea, orthopnea. Cough is worse as day progresses, better in the mornings.  He continues daily anoro ellipta.  + rhinorrhea consistently.  Regularly takes tylenol 1000mg  bid.      Relevant past medical, surgical, family and social history reviewed and updated as indicated. Interim medical history since our last visit reviewed. Allergies and medications reviewed and updated. Outpatient Medications Prior to Visit  Medication Sig Dispense Refill  . acetaminophen (TYLENOL) 500 MG tablet Take 1 tablet (500 mg total) by mouth 3 (three) times daily as needed. 30 tablet 0  . ANORO ELLIPTA 62.5-25 MCG/INH AEPB USE 1 INHALATION DAILY 360 each 1  . Ascorbic Acid (VITAMIN C PO) Take 1 tablet by mouth daily.     [name] atorvastatin (LIPITOR) 40 MG tablet TAKE 1 TABLET DAILY 90 tablet 0  . Cholecalciferol (VITAMIN D3) 2000 units TABS Take 2,000 Units by mouth daily.    [name] ezetimibe (ZETIA) 10 MG tablet TAKE 1 TABLET DAILY 90  tablet 3  . furosemide (LASIX) 40 MG tablet TAKE 1 TABLET DAILY (NEW DOSE) 90 tablet 0  . losartan (COZAAR) 100 MG tablet Take 100 mg by mouth once daily 90 tablet 3  . LYSINE PO Take 1 tablet by mouth daily.     [name] omeprazole (PRILOSEC) 20 MG capsule Take 20 mg by mouth daily.      . potassium chloride (K-DUR,KLOR-CON) 10 MEQ tablet TAKE 1 TABLET TWICE A DAY 180 tablet 4  . XARELTO 20 MG TABS tablet TAKE 1 TABLET DAILY WITH SUPPER 90 tablet 1  . albuterol (PROAIR HFA) 108 (90 Base) MCG/ACT inhaler Inhale 2 puffs into the lungs every 6 (six) hours as needed for up to 30 days for wheezing or shortness of breath. 1 Inhaler 3  . metoprolol succinate (TOPROL-XL) 100 MG 24 hr tablet 1 daily 90 tablet 3   No facility-administered medications prior to visit.      Per HPI unless specifically indicated in ROS section below Review of Systems Objective:    BP 134/72 (BP Location: Right Arm, Patient Position: Sitting, Cuff Size: Normal)   Pulse 74   Temp 97.6 F (36.4 C) (Temporal)   Ht 5\' 10"  (1.778 m)   Wt 220 lb (99.8 kg)   SpO2 97%   BMI 31.57 kg/m   Wt Readings from Last 3 Encounters:  08/10/18 220 lb (99.8 kg)  05/31/18 219 lb (99.3 kg)  04/21/18 219 lb (99.3 kg)    Physical Exam Vitals signs and nursing note reviewed.  Constitutional:      Appearance: Normal appearance. He is not ill-appearing.  HENT:     Mouth/Throat:     Mouth: Mucous membranes are moist.     Pharynx: No posterior oropharyngeal erythema.  Neck:     Musculoskeletal: Normal range of motion and neck supple.  Cardiovascular:     Rate and Rhythm: Normal rate and regular rhythm.     Pulses: Normal pulses.     Heart sounds: Normal heart sounds. No murmur.  Pulmonary:     Effort: Pulmonary effort is normal. No respiratory distress.     Breath sounds: Normal breath sounds. No wheezing, rhonchi or rales.  Musculoskeletal: Normal range of motion.     Right lower leg: No edema.     Left lower leg: No edema.      Comments: No midline cervical or thoracic spine tenderness, no paraspinous or rhomboid mm tenderness  Skin:    General: Skin is warm and dry.     Findings: No erythema or rash.  Neurological:     Mental Status: He is alert.  Psychiatric:        Mood and Affect: Mood normal.       Results for orders placed or performed in visit on 03/19/18  B Nat Peptide  Result Value Ref Range   Pro B Natriuretic peptide (BNP) 61.0 0.0 - 100.0 pg/mL  CBC with Differential/Platelet  Result Value Ref Range   WBC 8.2 4.0 - 10.5 K/uL   RBC 4.94 4.22 - 5.81 Mil/uL   Hemoglobin 16.3 13.0 - 17.0 g/dL   HCT 47.5 39.0 - 52.0 %   MCV 96.1 78.0 - 100.0 fl   MCHC 34.3 30.0 - 36.0 g/dL   RDW 15.2 11.5 - 15.5 %   Platelets 162.0 150.0 - 400.0 K/uL   Neutrophils Relative % 48.8 43.0 - 77.0 %   Lymphocytes Relative 39.4 12.0 - 46.0 %   Monocytes Relative 9.3 3.0 - 12.0 %   Eosinophils Relative 2.2 0.0 - 5.0 %   Basophils Relative 0.3 0.0 - 3.0 %   Neutro Abs 4.0 1.4 - 7.7 K/uL   Lymphs Abs 3.2 0.7 - 4.0 K/uL   Monocytes Absolute 0.8 0.1 - 1.0 K/uL   Eosinophils Absolute 0.2 0.0 - 0.7 K/uL   Basophils Absolute 0.0 0.0 - 0.1 K/uL  Basic metabolic panel  Result Value Ref Range   Sodium 137 135 - 145 mEq/L   Potassium 4.0 3.5 - 5.1 mEq/L   Chloride 100 96 - 112 mEq/L   CO2 28 19 - 32 mEq/L   Glucose, Bld 100 (H) 70 - 99 mg/dL   BUN 24 (H) 6 - 23 mg/dL   Creatinine, Ser 1.12 0.40 - 1.50 mg/dL   Calcium 9.3 8.4 - 10.5 mg/dL   GFR 62.84 >60.00 mL/min  EKG 12-Lead  Result Value Ref Range   EKG 12 lead     Assessment & Plan:   Problem List Items Addressed This Visit    Fatigue    Check for reversible causes.       Relevant Orders   Comprehensive metabolic panel   TSH   CBC with Differential/Platelet   Vitamin B12   Chronic cough - Primary    Persistent cough since PNA. ?allergic component given ongoing rhinorrhea despite regular flonase use. rec  start claritin while he gets in to see pulm.  Discussed delsym use as well. States considering PFTs.           No orders of the defined types were placed in this encounter.  Orders Placed This Encounter  Procedures  . Comprehensive metabolic panel  . TSH  . CBC with Differential/Platelet  . Vitamin B12    Patient Instructions  Trial claritin 10mg  daily in the mornings until you see Dr Annamaria Boots. Pick up plain mucinex or robitussin or delsym for cough.  Continue tylenol for back.    Follow up plan: Return in about 6 months (around 02/09/2019) for annual exam, prior fasting for blood work.  [name], MD

## 2018-08-10 NOTE — Assessment & Plan Note (Signed)
Persistent cough since PNA. ?allergic component given ongoing rhinorrhea despite regular flonase use. rec start claritin while he gets in to see pulm. Discussed delsym use as well. States considering PFTs.

## 2018-08-10 NOTE — Patient Instructions (Addendum)
Trial claritin 10mg  daily in the mornings until you see Dr Annamaria Boots. Pick up plain mucinex or robitussin or delsym for cough.  Continue tylenol for back.

## 2018-08-10 NOTE — Assessment & Plan Note (Signed)
Check for reversible causes.  

## 2018-08-12 ENCOUNTER — Other Ambulatory Visit: Payer: Self-pay

## 2018-08-12 ENCOUNTER — Ambulatory Visit (HOSPITAL_COMMUNITY)
Admission: RE | Admit: 2018-08-12 | Discharge: 2018-08-12 | Disposition: A | Discharge status: Home / Self Care | Payer: Medicare Other | Source: Ambulatory Visit | Attending: Cardiology | Admitting: Cardiology

## 2018-08-12 DIAGNOSIS — I77819 Aortic ectasia, unspecified site: Secondary | ICD-10-CM | POA: Insufficient documentation

## 2018-08-23 ENCOUNTER — Other Ambulatory Visit: Payer: Self-pay

## 2018-08-23 ENCOUNTER — Encounter: Payer: Self-pay | Admitting: Internal Medicine

## 2018-08-23 ENCOUNTER — Ambulatory Visit (INDEPENDENT_AMBULATORY_CARE_PROVIDER_SITE_OTHER): Payer: Medicare Other | Admitting: Internal Medicine

## 2018-08-23 DIAGNOSIS — J449 Chronic obstructive pulmonary disease, unspecified: Secondary | ICD-10-CM

## 2018-08-23 DIAGNOSIS — I428 Other cardiomyopathies: Secondary | ICD-10-CM | POA: Diagnosis not present

## 2018-08-23 MED ORDER — AEROCHAMBER MV MISC: 0 refills | Status: DC

## 2018-08-23 MED ORDER — BUDESONIDE-FORMOTEROL FUMARATE 160-4.5 MCG/ACT IN AERO: 2.0000 | INHALATION_SPRAY | Freq: Two times a day (BID) | RESPIRATORY_TRACT | 0 refills | Status: DC

## 2018-08-23 NOTE — Progress Notes (Signed)
Subjective:    Patient ID: Theodore [name], male    DOB: Jul 18, 1934, 84 y.o.   MRN: [id number] HPI male never smoker followed for COPD/bronchitis, complicated by HBP, peripheral edema, GERD, A. Fib/ ablation, CAD, PE/DVT,  PFT 2011 FEV1/FVC 0.65.    PFT 11/30/13- moderate obstruction, insignificant response to bronchodilator, airtrapping, minimal diffusion defect. FEV1 2.21/ 70%, FEV1/FVC 0.63, TLC 97%, DLCO 78%.  -----------------------------------------------------------------------------------------  03/19/2018- 83 year old male never smoker followed for COPD/bronchitis, complicated by HBP, peripheral edema, GERD, A. Fib/ Xarelto, PE/DVT (2019), CAD Spiriva, Xarelto -----pneumonia Nov 2019 - improved but last 6 weeks had start of congested cough again and heart feels like skipping CXR 01/19/2018- images reviewed. Persistent hazy LLL IMPRESSION: 1. No lung nodule is seen with nipple markers placed. 2. No change in hyper aeration and mild cardiomegaly  On arrival today HR 48/minute, BP 110/60, body weight 218 pounds, room air saturation 98% He was diagnosed with pneumonia in November.  At his last visit with Dr. Camnitz/Cardiology in December he was in sinus rhythm.  Over the last 6 weeks he has had increased cough with scant clear sputum, rhinorrhea, and persistent irregular heartbeat which he recognizes as dropping every third beat.  He denies chest pain, syncope, ankle edema or increased nocturia. He has liked Anoro but does not feel he is getting a deep breath. EKG 03/19/2018-sinus bradycardia with frequent multifocal PVCs/ventricular trigeminy  08/23/2018-  84 year old male never smoker followed for COPD/bronchitis, complicated by HBP, peripheral edema, GERD, A. Fib/ Xarelto, PE/DVT (2019), CAD Spiriva, Xarelto Saw PCP for persistent cough after pneumonia and was to try daily claritin, mucinex. Seen here in March by NP who gave prednisone, Zyrtec. PFT was ordered by blocked by Covid  restriction at that time. Anoro not helping- feels he can't inhale it deeply. ProAir not helping. He describes minimal cough- dry or scant white. Note metoprolol, losartan not high risk for this. Notes easier dyspnea on exertion, avoiding stairs. Hard to walk and play golf outdoors esp with current hot, humid weather. No acute events.    Persistent deep tight/ ache mid back. CXR 03/19/2018-  No active cardiopulmonary disease.  ROS-see HPI + = positive Constitutional:   No-   weight loss, night sweats, fevers, chills, fatigue, lassitude. HEENT:   No-  headaches, difficulty swallowing, tooth/dental problems, sore throat,       No-  sneezing, itching, ear ache, nasal congestion, post nasal drip,  CV:  No-   chest pain, orthopnea, PND, swelling in lower extremities, anasarca, dizziness, +palpitations Resp: + shortness of breath with exertion or at rest.              No-   productive cough,  +non-productive cough,  No- coughing up of blood.              No-   change in color of mucus.  +wheezing.   Skin: No-   rash or lesions. GI:  No-   heartburn, indigestion, abdominal pain, nausea, vomiting,  GU:  MS:  No-   joint pain or swelling.  Neuro-     nothing unusual Psych:  No- change in mood or affect. No depression or anxiety.  No memory loss.  OBJ- Physical Exam General- Alert, Oriented, Affect-appropriate, Distress- none acute, looks fit and trim Skin- +ecchymoses on arms  Lymphadenopathy- none Head- atraumatic            Eyes- Gross vision intact, PERRLA, conjunctivae and secretions clear  Ears- Hearing, canals-normal            Nose- Clear, no-Septal dev, mucus, polyps, erosion, perforation             Throat- Mallampati II , mucosa clear , drainage- none, tonsils- atrophic,  Neck- flexible , trachea midline, no stridor , thyroid nl, carotid no bruit Chest - symmetrical excursion , unlabored           Heart/CV- RRR , no murmur , no gallop  , no rub, nl s1 s2                            - JVD- none , edema- none, stasis changes- none, varices- none           Lung-  Coarse in bases+, wheeze- none, cough- none here, dullness-none, rub- none           Chest wall-  Abd-  Br/ Gen/ Rectal- Not done, not indicated Extrem- cyanosis- none, clubbing, none, atrophy- none, strength- nl Neuro- grossly intact to observation

## 2018-08-23 NOTE — Addendum Note (Signed)
Addended by: Annie Paras D on: 08/23/2018 11:11 AM   Modules accepted: Orders

## 2018-08-23 NOTE — Assessment & Plan Note (Signed)
We will try inhaled steroid, using spacer to improve delivery- Symbicort instead of Anoro. Rescheduling PFT that was delayed by Covid. If Symbicort doesn't make a difference, he may need further imaging and walk test for oxygen status.

## 2018-08-23 NOTE — Assessment & Plan Note (Signed)
Cardiac status being followed closely by cardiology

## 2018-08-23 NOTE — Patient Instructions (Signed)
Sample x 2 Symbicort 160    With aerochamber spacer tube     Inhale 2 puffs through spacer, then rinse mouth, twice daily maintenance, instead of Anoro.   Order- please reschedule PFT   Dx COPD mixed type  Please call as needed

## 2018-08-26 ENCOUNTER — Other Ambulatory Visit: Payer: Self-pay | Admitting: Cardiovascular Disease

## 2018-09-27 ENCOUNTER — Ambulatory Visit (INDEPENDENT_AMBULATORY_CARE_PROVIDER_SITE_OTHER): Payer: Medicare Other | Admitting: Cardiology

## 2018-09-27 ENCOUNTER — Encounter: Payer: Self-pay | Admitting: Cardiology

## 2018-09-27 ENCOUNTER — Other Ambulatory Visit: Payer: Self-pay

## 2018-09-27 VITALS — BP 120/76 | HR 70 | Ht 70.0 in | Wt 220.2 lb

## 2018-09-27 DIAGNOSIS — I480 Paroxysmal atrial fibrillation: Secondary | ICD-10-CM | POA: Diagnosis not present

## 2018-09-27 MED ORDER — BUDESONIDE-FORMOTEROL FUMARATE 160-4.5 MCG/ACT IN AERO: 2.0000 | INHALATION_SPRAY | Freq: Two times a day (BID) | RESPIRATORY_TRACT | 1 refills | Status: DC

## 2018-09-27 NOTE — Progress Notes (Signed)
Electrophysiology Office Note   Date:  09/27/2018   ID:  Theodore [name], DOB 12/19/1934, MRN [id number]  PCP:  Theodore Bush, MD  Cardiologist:  Theodore [name] Electrophysiologist:  Dr Theodore [name]    CC: Follow up for persistent atrial fibrillation   History of Present Illness: Theodore [name] is a 84 y.o. male who presents today for electrophysiology evaluation.   He has a history of hypertension, hyperlipidemia, persistent atrial fibrillation, asthma, and rheumatic fever. Theodore [name] was diagnosed with atrial fibrillation in 2014.  He was initially on Pradaxa and switched to Xarelto.  He had an episode of chest pain 08/2014 and had a Lexiscan Myoview 08/31/14 that showed an old inferior scar with peri-infarct ischemia and LVEF 49%.  He then had a LHC that revealed 70% ostial LAD, 50% prox LAD, 30% OM1 and 35% RCA.    Status post atrial fibrillation ablation on 12/19/15.  Today, denies symptoms of palpitations, chest pain,  orthopnea, PND, lower extremity edema, claudication, dizziness, presyncope, syncope, bleeding, or neurologic sequela. The patient is tolerating medications without difficulties.  Unfortunately, last spring he developed shortness of breath and a cough.  He is currently being worked up by pulmonology.  He is awaiting PFTs.  His cough is continued despite adjustments in medications, though he has been recently started on Spiriva which has helped.  Past Medical History:  Diagnosis Date  . History of arthritis   . History of rheumatic fever   . Hypercholesterolemia   . Hypertension   . Nocturia   . PAF (paroxysmal atrial fibrillation) [facility]) January 2013   Placed on Pradaxa. Did not require cardioversion; spontaneously converted  . Peripheral edema   . Right bundle branch block   . SOB (shortness of breath)   . Tendonitis of elbow, left    Past Surgical History:  Procedure Laterality Date  . ATRIAL FIBRILLATION ABLATION  12/19/2015  . BACK SURGERY  12/24/09   fusion  C3-C4  . BACK SURGERY  2010   fusion L4-L5  . CARDIAC CATHETERIZATION  2009   NONOBSTRUCTIVE ATHERSCLEROTIC CORONARY DISEASE AND NORMAL  LV FUNCTION  . CARDIAC CATHETERIZATION N/A 09/08/2014   Procedure: Left Heart Cath and Coronary Angiography;  Surgeon: Theodore Crome, MD;  Location: [place] [facility];  Service: Cardiovascular;  Laterality: N/A;  . CARDIAC CATHETERIZATION N/A 09/28/2015   Procedure: Left Heart Cath and Coronary Angiography;  Surgeon: Theodore Man, MD;  Location: [place] [facility];  Service: Cardiovascular;  Laterality: N/A;  . CARDIAC CATHETERIZATION N/A 09/28/2015   Procedure: Intravascular Pressure Wire/FFR Study;  Surgeon: Theodore Man, MD;  Location: [place] [facility];  Service: Cardiovascular;  Laterality: N/A;  . CARDIOVERSION N/A 10/23/2015   Procedure: CARDIOVERSION;  Surgeon: Theodore Perla, MD;  Location: [facility];  Service: Cardiovascular;  Laterality: N/A;  . COLONOSCOPY  2013   per patient, rpt 5 yrs  . ELECTROPHYSIOLOGIC STUDY N/A 12/19/2015   Procedure: Atrial Fibrillation Ablation;  Surgeon: Theodore Sporn Meredith Leeds, MD;  Location: [place] [facility];  Service: Cardiovascular;  Laterality: N/A;  . KNEE ARTHROSCOPY Left 03/2016   Dr. Alvan [name]  . PATELLAR TENDON REPAIR Left 2008  . REPLACEMENT TOTAL KNEE Right 2006  . TOTAL KNEE ARTHROPLASTY Left 02/09/2017   Procedure: LEFT TOTAL KNEE ARTHROPLASTY, EXCISION LEFT DISTAL THIGH MASS;  Surgeon: Theodore Cancel, MD;  Location: [facility];  Service: Orthopedics;  Laterality: Left;  90 mins  . TRICEPS TENDON REPAIR Left 2013     Current  Outpatient Medications  Medication Sig Dispense Refill  . acetaminophen (TYLENOL) 500 MG tablet Take 1 tablet (500 mg total) by mouth 3 (three) times daily as needed. 30 tablet 0  . Ascorbic Acid (VITAMIN C PO) Take 1 tablet by mouth daily.     [name] atorvastatin (LIPITOR) 40 MG tablet TAKE 1 TABLET DAILY 90 tablet 3  . budesonide-formoterol (SYMBICORT) 160-4.5 MCG/ACT inhaler Inhale 2  puffs into the lungs 2 (two) times daily. 2 Inhaler 0  . Cholecalciferol (VITAMIN D3) 2000 units TABS Take 2,000 Units by mouth daily.    [name] ezetimibe (ZETIA) 10 MG tablet TAKE 1 TABLET DAILY 90 tablet 3  . furosemide (LASIX) 40 MG tablet TAKE 1 TABLET DAILY (NEW DOSE) 90 tablet 3  . losartan (COZAAR) 100 MG tablet Take 100 mg by mouth once daily 90 tablet 3  . LYSINE PO Take 1 tablet by mouth daily.     . metoprolol succinate (TOPROL-XL) 100 MG 24 hr tablet 1 daily 90 tablet 3  . omeprazole (PRILOSEC) 20 MG capsule Take 20 mg by mouth daily.      . potassium chloride (K-DUR,KLOR-CON) 10 MEQ tablet TAKE 1 TABLET TWICE A DAY 180 tablet 4  . Spacer/Aero-Holding Chambers (AEROCHAMBER MV) inhaler Use as instructed 1 each 0  . XARELTO 20 MG TABS tablet TAKE 1 TABLET DAILY WITH SUPPER 90 tablet 1  . albuterol (PROAIR HFA) 108 (90 Base) MCG/ACT inhaler Inhale 2 puffs into the lungs every 6 (six) hours as needed for up to 30 days for wheezing or shortness of breath. 1 Inhaler 3   No current facility-administered medications for this visit.     Allergies:   Penicillins, Amlodipine, Lisinopril, Mevacor [lovastatin], Vicodin [hydrocodone-acetaminophen], Zocor [simvastatin], and Oseltamivir phosphate   Social History:  The patient  reports that he has never smoked. He has never used smokeless tobacco. He reports current alcohol use of about 14.0 standard drinks of alcohol per week. He reports that he does not use drugs.   Family History:  The patient's family history includes Asthma in his sister; COPD in his brother and father; Heart attack in his father; Heart disease in his brother; Hypertension in his father.    ROS:  Please see the history of present illness.   Otherwise, review of systems is positive for none.   All other systems are reviewed and negative.   PHYSICAL EXAM: VS:  BP 120/76   Pulse 70   Ht 5\' 10"  (1.778 m)   Wt 220 lb 3.2 oz (99.9 kg)   SpO2 97%   BMI 31.60 kg/m  , BMI Body  mass index is 31.6 kg/m. GEN: Well nourished, well developed, in no acute distress  HEENT: normal  Neck: no JVD, carotid bruits, or masses Cardiac: RRR; no murmurs, rubs, or gallops,no edema  Respiratory:  clear to auscultation bilaterally, normal work of breathing GI: soft, nontender, nondistended, + BS MS: no deformity or atrophy  Skin: warm and dry Neuro:  Strength and sensation are intact Psych: euthymic mood, full affect  EKG:  EKG is ordered today. Personal review of the ekg ordered shows sinus rhythm, right bundle branch block   Recent Labs: 03/19/2018: Pro B Natriuretic peptide (BNP) 61.0 08/10/2018: ALT 18; BUN 17; Creatinine, Ser 0.98; Hemoglobin 15.9; Platelets 144.0; Potassium 4.7; Sodium 136; TSH 1.63    Lipid Panel     Component Value Date/Time   CHOL 121 01/12/2018 0804   CHOL 121 07/29/2016 0822   TRIG 80.0 01/12/2018 0804  HDL 44.30 01/12/2018 0804   HDL 56 07/29/2016 0822   CHOLHDL 3 01/12/2018 0804   VLDL 16.0 01/12/2018 0804   LDLCALC 61 01/12/2018 0804   LDLCALC 56 07/29/2016 0822     Wt Readings from Last 3 Encounters:  09/27/18 220 lb 3.2 oz (99.9 kg)  08/23/18 220 lb (99.8 kg)  08/10/18 220 lb (99.8 kg)      Other studies Reviewed: Additional studies/ records that were reviewed today include: SPECT 10/17/15, TTE 10/09/14, Echo 07/13/17  Review of the above records today demonstrates:   The left ventricular ejection fraction is moderately decreased (30-44%).  Nuclear stress EF: 39%.  There was no ST segment deviation noted during stress.  There is a small defect of moderate severity present in the apical lateral and apex location and medium sized defect of moderate severity in the basal inferior, mild inferior and apical inferior location. These defects are fixed and consistent with scar. No ischemia noted.  This is an intermediate risk study due to LV dysfunction.   - Left ventricle: The cavity size was normal. Wall thickness was   normal.  Systolic function was normal. The estimated ejection   fraction was in the range of 55% to 60%. Wall motion was normal;   there were no regional wall motion abnormalities. Doppler   parameters are consistent with abnormal left ventricular   relaxation (grade 1 diastolic dysfunction). - Aortic valve: There was mild regurgitation. - Mitral valve: There was mild regurgitation. - Left atrium: The atrium was mildly dilated. - Right ventricle: The cavity size was mildly dilated. - Right atrium: The atrium was mildly dilated. - Pulmonary arteries: Systolic pressure was mildly increased. PA   peak pressure: 36 mm Hg (S).  Impressions:  - Normal LV systolic function; grade 1 diastolic dysfunction; mild   biatrial enlargement; mild RVE; calcified aortic valve with mild   AI; mild MR; trace TR with mildly elevated pulmonary pressure.  Echo 07/13/17 - Left ventricle: The cavity size was normal. Wall thickness was   increased in a pattern of mild LVH. Systolic function was normal.   The estimated ejection fraction was in the range of 55% to 60%.   Left ventricular diastolic function parameters were normal. - Aortic valve: There was mild regurgitation. - Left atrium: The atrium was moderately dilated. - Atrial septum: No defect or patent foramen ovale was identified. - Pulmonary arteries: PA peak pressure: 44 mm Hg (S).  ASSESSMENT AND PLAN:  1.  Persistent atrial fibrillation: Status post ablation 12/19/2015.  On metoprolol and Xarelto.  He is in sinus rhythm.  No changes.  This patients CHA2DS2-VASc Score and unadjusted Ischemic Stroke Rate (% per year) is equal to 7.2 % stroke rate/year from a score of 5  Above score calculated as 1 point each if present [CHF, HTN, DM, Vascular=MI/PAD/Aortic Plaque, Age if 65-74, or Male] Above score calculated as 2 points each if present [Age > 75, or Stroke/TIA/TE]   2. CAD: No current chest pain.  Continue current management.  3. Chronic systolic  heart failure: On metoprolol, losartan, Lasix.  Normalized ejection fraction.  No signs of volume overload.  He is having cough.  His most recent echo when his symptoms started as well as a BNP showed no evidence of volume overload with a stable ejection fraction.  4. Hypertension: Blood pressure well controlled.  5. Hyperlipidemia: Continue Lipitor  6. Ectatic abdominal aorta: Repeat ultrasound every 5 years.  Current medicines are reviewed at length with the  patient today.   The patient does not have concerns regarding his medicines.  The following changes were made today: None  Labs/ tests ordered today include:  Orders Placed This Encounter  Procedures  . EKG 12-Lead     Disposition:   FU with Orion Vandervort 6 months  Signed, Valeska Haislip Meredith Leeds, MD  09/27/2018 9:36 AM     [facility] [address] [place] [address] [phone] (office) [phone] (fax)

## 2018-09-27 NOTE — Telephone Encounter (Signed)
Patient sent an email letting us know that he hasn't received a call to schedule his PFT.  Sent response that we would make sure that he is on the list to be scheduled.  Patient also asked for Symbicort Rx to be sent in to mail order pharmacy.  Rx sent.  Nothing further needed at this time.    Will route to [facility] to follow up on PFT.

## 2018-09-27 NOTE — Patient Instructions (Addendum)
Medication Instructions:  Your physician recommends that you continue on your current medications as directed. Please refer to the Current Medication list given to you today.  If you need a refill on your cardiac medications before your next appointment, please call your pharmacy.   Labwork: None ordered  Testing/Procedures: None ordered  Follow-Up: Your physician wants you to follow-up in: 6 months with Dr. Camnitz.  You will receive a reminder letter in the mail two months in advance. If you don't receive a letter, please call our office to schedule the follow-up appointment.  Thank you for choosing [facility]!!   Celester Morgan, RN [phone]         

## 2018-10-04 ENCOUNTER — Other Ambulatory Visit: Payer: Self-pay | Admitting: Internal Medicine

## 2018-10-12 ENCOUNTER — Other Ambulatory Visit: Payer: Self-pay | Admitting: Cardiovascular Disease

## 2018-10-12 ENCOUNTER — Other Ambulatory Visit (HOSPITAL_COMMUNITY)
Admission: RE | Admit: 2018-10-12 | Discharge: 2018-10-12 | Disposition: A | Discharge status: Home / Self Care | Payer: Medicare Other | Source: Ambulatory Visit | Attending: Internal Medicine | Admitting: Internal Medicine

## 2018-10-12 DIAGNOSIS — Z20828 Contact with and (suspected) exposure to other viral communicable diseases: Secondary | ICD-10-CM | POA: Diagnosis not present

## 2018-10-12 DIAGNOSIS — Z01812 Encounter for preprocedural laboratory examination: Secondary | ICD-10-CM | POA: Insufficient documentation

## 2018-10-12 LAB — SARS CORONAVIRUS 2 (TAT 6-24 HRS): SARS Coronavirus 2: NEGATIVE

## 2018-10-12 NOTE — Telephone Encounter (Signed)
82m 99.9kg Scr 0.98 08/10/18 ccr 76mlmin Lovw/camnitz 09/27/18

## 2018-10-15 ENCOUNTER — Ambulatory Visit (INDEPENDENT_AMBULATORY_CARE_PROVIDER_SITE_OTHER): Payer: Medicare Other | Admitting: Internal Medicine

## 2018-10-15 ENCOUNTER — Other Ambulatory Visit: Payer: Self-pay

## 2018-10-15 DIAGNOSIS — J449 Chronic obstructive pulmonary disease, unspecified: Secondary | ICD-10-CM

## 2018-10-15 LAB — PULMONARY FUNCTION TEST
DL/VA % pred: 110 %
DL/VA: 4.25 ml/min/mmHg/L
DLCO unc % pred: 99 %
DLCO unc: 24.62 ml/min/mmHg
FEF 25-75 Post: 1.52 L/sec
FEF 25-75 Pre: 0.94 L/sec
FEF2575-%Change-Post: 60 %
FEF2575-%Pred-Post: 77 %
FEF2575-%Pred-Pre: 47 %
FEV1-%Change-Post: 16 %
FEV1-%Pred-Post: 75 %
FEV1-%Pred-Pre: 65 %
FEV1-Post: 2.21 L
FEV1-Pre: 1.91 L
FEV1FVC-%Change-Post: 9 %
FEV1FVC-%Pred-Pre: 85 %
FEV6-%Change-Post: 8 %
FEV6-%Pred-Post: 85 %
FEV6-%Pred-Pre: 79 %
FEV6-Post: 3.28 L
FEV6-Pre: 3.04 L
FEV6FVC-%Change-Post: 1 %
FEV6FVC-%Pred-Post: 106 %
FEV6FVC-%Pred-Pre: 104 %
FVC-%Change-Post: 6 %
FVC-%Pred-Post: 80 %
FVC-%Pred-Pre: 75 %
FVC-Post: 3.32 L
FVC-Pre: 3.12 L
Post FEV1/FVC ratio: 67 %
Post FEV6/FVC ratio: 99 %
Pre FEV1/FVC ratio: 61 %
Pre FEV6/FVC Ratio: 97 %
RV % pred: 136 %
RV: 3.76 L
TLC % pred: 97 %
TLC: 7.08 L

## 2018-10-15 NOTE — Progress Notes (Signed)
PFT done today. 

## 2018-10-19 ENCOUNTER — Ambulatory Visit (INDEPENDENT_AMBULATORY_CARE_PROVIDER_SITE_OTHER): Payer: Medicare Other | Admitting: Cardiovascular Disease

## 2018-10-19 ENCOUNTER — Other Ambulatory Visit: Payer: Self-pay

## 2018-10-19 ENCOUNTER — Encounter: Payer: Self-pay | Admitting: Cardiovascular Disease

## 2018-10-19 VITALS — BP 129/78 | HR 71 | Ht 71.0 in | Wt 218.0 lb

## 2018-10-19 DIAGNOSIS — I2510 Atherosclerotic heart disease of native coronary artery without angina pectoris: Secondary | ICD-10-CM

## 2018-10-19 DIAGNOSIS — I480 Paroxysmal atrial fibrillation: Secondary | ICD-10-CM

## 2018-10-19 DIAGNOSIS — I10 Essential (primary) hypertension: Secondary | ICD-10-CM

## 2018-10-19 DIAGNOSIS — I493 Ventricular premature depolarization: Secondary | ICD-10-CM | POA: Diagnosis not present

## 2018-10-19 DIAGNOSIS — J449 Chronic obstructive pulmonary disease, unspecified: Secondary | ICD-10-CM

## 2018-10-19 DIAGNOSIS — R0602 Shortness of breath: Secondary | ICD-10-CM

## 2018-10-19 NOTE — Patient Instructions (Signed)
Medication Instructions:  Your physician recommends that you continue on your current medications as directed. Please refer to the Current Medication list given to you today.  If you need a refill on your cardiac medications before your next appointment, please call your pharmacy.   Lab work: NONE  Testing/Procedures: NONE  Follow-Up: At [facility], you and your health needs are our priority.  As part of our continuing mission to provide you with exceptional heart care, we have created designated Provider Care Teams.  These Care Teams include your primary Cardiologist (physician) and Advanced Practice Providers (APPs -  Physician Assistants and Nurse Practitioners) who all work together to provide you with the care you need, when you need it. You will need a follow up appointment in 2 months.  You may see Skeet Latch, MD or one of the following Advanced Practice Providers on your designated Care Team:   Kerin Ransom, PA-C Roby Lofts, [place] . Sande Rives, PA-C

## 2018-10-19 NOTE — Progress Notes (Signed)
Cardiology Office Note   Date:  10/19/2018   ID:  Theodore [name], DOB August 26, 1934, MRN [id number]  PCP:  [name], MD  Cardiologist:   Skeet Latch, MD   No chief complaint on file.    History of Present Illness: Theodore [name] is a 84 y.o. male with chronic systolic and diastolic heart failure, hypertension, hyperlipidemia, paroxysmal atrial fibrillation, asthma and Rheumatic fever who presents for follow up.  Theodore [name] was previously a patient of Dr. Mare Ferrari.  Theodore [name] was diagnosed with atrial fibrillation in 2014.  He was initially on Pradaxa and switched to Xarelto.  He had an episode of chest pain 08/2014 and had a Lexiscan Myoview 08/31/14 that showed an old inferior scar with peri-infarct ischemia and LVEF 49%.  He then had a LHC that revealed 70% ostial LAD, 50% prox LAD, 30% OM1 and 35% RCA.  He was managed medically.  He subsequently had an echo 09/2014 that showed LVEF 55-60% with mild MR and AR and grade 1 diastolic dysfunction.  At that time atorvastatin was increased to 80 mg but he noted leg pain.  He was seen in the lipid clinic and started back on atorvastatin 40mg  and zetia 10 mg.    Theodore [name] was seen in clinic 09/25/15 and reported significant shortness of breath with minimal exertion.  He had a LHC with Dr. Ellyn Hack on 09/28/15 that again revealed a 70% ostial-proximal LAD lesion.  Dr. Ellyn Hack performed FFR on that lesion and there was no significant change.  The left ventriculogram performed at that time revealed LVEF 35-45%.  Theodore [name] continued to have exertional dyspnea so he was referred for [facility] 10/17/15 that was negative for ischemia. He subsequently underwent DCCV on 10/23/15.  He underwent ablation on 12/19/15 with Dr. [name] and has remained in sinus rhythm.    Theodore [name] underwent L knee replacement on  02/09/17.  Xarelto was held for 2 days prior to surgery and resumed post-operatively.  He developed bleeding at the incision site so Xarelto was  again held and he was switched to aspirin.  He subsequently developed a left lower extremity DVT and pulmonary embolism.  Xarelto was resumed.  He had an echocardiogram 03/04/17 that revealed LVEF 50-55% with moderate to severe pulmonary arterial hypertension with a PASP of 60 mmHg. He had pneumonia 12/2017 and subsequently developed a cough.  He developed shortness of breath and saw both Kerin Ransom, PA-C and Dr. Annamaria Boots in pulmonary.  BNP was 61 and his symptoms were not felt to be due to heart failure.  He ultimately started on Symbicort and thinks that in the last month his breathing is started to improve.  He had PFTs obtained this week that showed moderate obstructive lung disease.  He is scheduled to follow-up with Dr. Annamaria Boots in 2 months.  He continues to struggle with some cough and fatigue.  The cough is much better than it was but still persist somewhat.  He is frustrated that he does not have the energy to do things like golfing anymore.  He gets short of breath with exertion.  He has minor back pain and occasional sharp pain in his chest but never with exertion.  He ambulated in our office today without a drop in his oxygen saturation.   Past Medical History:  Diagnosis Date  . History of arthritis   . History of rheumatic fever   . Hypercholesterolemia   . Hypertension   . Nocturia   .  PAF (paroxysmal atrial fibrillation) [facility]) January 2013   Placed on Pradaxa. Did not require cardioversion; spontaneously converted  . Peripheral edema   . Right bundle branch block   . SOB (shortness of breath)   . Tendonitis of elbow, left     Past Surgical History:  Procedure Laterality Date  . ATRIAL FIBRILLATION ABLATION  12/19/2015  . BACK SURGERY  12/24/09   fusion C3-C4  . BACK SURGERY  2010   fusion L4-L5  . CARDIAC CATHETERIZATION  2009   NONOBSTRUCTIVE ATHERSCLEROTIC CORONARY DISEASE AND NORMAL  LV FUNCTION  . CARDIAC CATHETERIZATION N/A 09/08/2014   Procedure: Left Heart Cath and  Coronary Angiography;  Surgeon: Belva Crome, MD;  Location: [place] [facility];  Service: Cardiovascular;  Laterality: N/A;  . CARDIAC CATHETERIZATION N/A 09/28/2015   Procedure: Left Heart Cath and Coronary Angiography;  Surgeon: Leonie Man, MD;  [place] [facility];  Service: Cardiovascular;  Laterality: N/A;  . CARDIAC CATHETERIZATION N/A 09/28/2015   Procedure: Intravascular Pressure Wire/FFR Study;  Surgeon: Leonie Man, MD;  Location: [place] [facility];  Service: Cardiovascular;  Laterality: N/A;  . CARDIOVERSION N/A 10/23/2015   Procedure: CARDIOVERSION;  Surgeon: [name], MD;  Location: [facility] ENDOSCOPY;  Service: Cardiovascular;  Laterality: N/A;  . COLONOSCOPY  2013   per patient, rpt 5 yrs  . ELECTROPHYSIOLOGIC STUDY N/A 12/19/2015   Procedure: Atrial Fibrillation Ablation;  Surgeon: [name], MD;  Location: [place] [facility];  Service: Cardiovascular;  Laterality: N/A;  . KNEE ARTHROSCOPY Left 03/2016   Dr. [name]  . PATELLAR TENDON REPAIR Left 2008  . REPLACEMENT TOTAL KNEE Right 2006  . TOTAL KNEE ARTHROPLASTY Left 02/09/2017   Procedure: LEFT TOTAL KNEE ARTHROPLASTY, EXCISION LEFT DISTAL THIGH MASS;  Surgeon: [name], MD;  Location: [facility];  Service: Orthopedics;  Laterality: Left;  90 mins  . TRICEPS TENDON REPAIR Left 2013     Current Outpatient Medications  Medication Sig Dispense Refill  . acetaminophen (TYLENOL) 500 MG tablet Take 1 tablet (500 mg total) by mouth 3 (three) times daily as needed. 30 tablet 0  . Ascorbic Acid (VITAMIN C PO) Take 1 tablet by mouth daily.     [name] atorvastatin (LIPITOR) 40 MG tablet TAKE 1 TABLET DAILY 90 tablet 3  . budesonide-formoterol (SYMBICORT) 160-4.5 MCG/ACT inhaler Inhale 2 puffs into the lungs 2 (two) times daily. 3 Inhaler 1  . Cholecalciferol (VITAMIN D3) 2000 units TABS Take 2,000 Units by mouth daily.    [name] ezetimibe (ZETIA) 10 MG tablet TAKE 1 TABLET DAILY 90 tablet 3  . furosemide (LASIX)  40 MG tablet TAKE 1 TABLET DAILY (NEW DOSE) 90 tablet 3  . losartan (COZAAR) 100 MG tablet Take 100 mg by mouth once daily 90 tablet 3  . LYSINE PO Take 1 tablet by mouth daily.     [name] omeprazole (PRILOSEC) 20 MG capsule Take 20 mg by mouth daily.      . potassium chloride (K-DUR,KLOR-CON) 10 MEQ tablet TAKE 1 TABLET TWICE A DAY 180 tablet 4  . Spacer/Aero-Holding Chambers (AEROCHAMBER MV) inhaler Use as instructed 1 each 0  . XARELTO 20 MG TABS tablet TAKE 1 TABLET DAILY WITH SUPPER 90 tablet 1  . metoprolol succinate (TOPROL-XL) 100 MG 24 hr tablet 1 daily 90 tablet 3   No current facility-administered medications for this visit.     Allergies:   Penicillins, Amlodipine, Lisinopril, Mevacor [lovastatin], Vicodin [hydrocodone-acetaminophen], Zocor [simvastatin], and Oseltamivir phosphate  Social History:  The patient  reports that he has never smoked. He has never used smokeless tobacco. He reports current alcohol use of about 14.0 standard drinks of alcohol per week. He reports that he does not use drugs.   Family History:  The patient's family history includes Asthma in his sister; COPD in his brother and father; Heart attack in his father; Heart disease in his brother; Hypertension in his father.    ROS:  Please see the history of present illness.   Otherwise, review of systems are positive for none.   All other systems are reviewed and negative.    PHYSICAL EXAM: VS:  BP 129/78   Pulse 71   Ht 5\' 11"  (1.803 m)   Wt 218 lb (98.9 kg)   SpO2 96%   BMI 30.40 kg/m  , BMI Body mass index is 30.4 kg/m. GENERAL:  Well appearing HEENT: Pupils equal round and reactive, fundi not visualized, oral mucosa unremarkable NECK:  No jugular venous distention, waveform within normal limits, carotid upstroke brisk and symmetric, no bruits LUNGS:  Clear to auscultation bilaterally HEART:  RRR.  PMI not displaced or sustained,S1 and S2 within normal limits, no S3, no S4, no clicks, no rubs, no  murmurs ABD:  Flat, positive bowel sounds normal in frequency in pitch, no bruits, no rebound, no guarding, no midline pulsatile mass, no hepatomegaly, no splenomegaly EXT:  2 plus pulses throughout, trace LE edema, no cyanosis no clubbing SKIN:  No rashes no nodules NEURO:  Cranial nerves II through XII grossly intact, motor grossly intact throughout PSYCH:  Cognitively intact, oriented to person place and time   EKG:  EKG is not ordered today. 10/30/15: Sinus rhythm.  Rate 60 bpm.  RBBB.  LAD.  The ekg ordered 09/25/15 demonstrates atrial fibrillation.  Rate 84 bpm.   Echo 03/25/18: IMPRESSIONS    1. The left ventricle has a visually estimated ejection fraction of of 50%. The cavity size was normal. There is mildly increased left ventricular wall thickness. Left ventricular diastolic Doppler parameters are consistent with impaired relaxation Left  ventricular diffuse hypokinesis.  2. The right ventricle has mildly reduced systolic function. The cavity was mildly enlarged. There is no increase in right ventricular wall thickness.  3. Left atrial size was mildly dilated.  4. Right atrial size was mildly dilated.  5. The mitral valve is normal in structure. No evidence of mitral valve stenosis. Trivial regurgitation.  6. The tricuspid valve is normal in structure.  7. The aortic valve is tricuspid There is mild calcification of the aortic valve. Aortic valve regurgitation is trivial by color flow Doppler. No stenosis.  8. The pulmonic valve was normal in structure.  9. There is mild dilatation of the aortic root measuring 38 mm. 10. Right atrial pressure is estimated at 3 mmHg. 11. No complete TR doppler jet so unable to estimate PA systolic pressure.  Lexiscan Myoview 10/17/15:  The left ventricular ejection fraction is moderately decreased (30-44%).  Nuclear stress EF: 39%.  There was no ST segment deviation noted during stress.  There is a small defect of moderate severity  present in the apical lateral and apex location and medium sized defect of moderate severity in the basal inferior, mild inferior and apical inferior location. These defects are fixed and consistent with scar. No ischemia noted.  This is an intermediate risk study due to LV dysfunction.   LHC 09/28/15:  Ostial to proximal LAD stenoses of this at least moderately calcified.  70% focal stenosis followed by diffuse 50% stenosis.  FFR evaluation of the combination of the LAD lesions showed no significant drop in baseline FFR from 0.96 down to 0.95. This suggests physiologically not significant lesion.  There is moderate left ventricular systolic dysfunction. The left ventricular ejection fraction is 35-45% by visual estimate.  LV end diastolic pressure is moderately elevated.  LHC 09/08/14:   1. 1st Mrg lesion, 30% stenosed. 2. Prox LAD lesion, 50% stenosed. 3. Ost LAD lesion, 70% stenosed. 4. Prox RCA to Dist RCA lesion, 35% stenosed.   50-70% ostial/proximal LAD stenosis within the heavily calcified segment. Recent myocardial perfusion energy and within the past week did not demonstrate anterior ischemia.  Heavily calcified but widely patent circumflex and right coronary with luminal irregularities noted in the proximal mid and distal segments.  Mild global left ventricular dysfunction with an ejection fraction in the 45-50% range   Recommendations:   The patient's symptoms are mixed. With exertion he experiences dyspnea. At rest and randomly he has neck jaw and left elbow discomfort. When both the angiogram and the myocardial perfusion study were considered together, no justification for percutaneous intervention on the LAD could be made.  Close clinical follow-up  Aggressive risk factor modification    Recent Labs: 03/19/2018: Pro B Natriuretic peptide (BNP) 61.0 08/10/2018: ALT 18; BUN 17; Creatinine, Ser 0.98; Hemoglobin 15.9; Platelets 144.0; Potassium 4.7; Sodium 136;  TSH 1.63    Lipid Panel    Component Value Date/Time   CHOL 121 01/12/2018 0804   CHOL 121 07/29/2016 0822   TRIG 80.0 01/12/2018 0804   HDL 44.30 01/12/2018 0804   HDL 56 07/29/2016 0822   CHOLHDL 3 01/12/2018 0804   VLDL 16.0 01/12/2018 0804   LDLCALC 61 01/12/2018 0804   LDLCALC 56 07/29/2016 0822      Wt Readings from Last 3 Encounters:  10/19/18 218 lb (98.9 kg)  09/27/18 220 lb 3.2 oz (99.9 kg)  08/23/18 220 lb (99.8 kg)      ASSESSMENT AND PLAN:  # Paroxysmal atrial fibrillation: No recurrent episode since his ablation 12/2015. Continue metoprolol and Xarelto.   This patients CHA2DS2-VASc Score and unadjusted Ischemic Stroke Rate (% per year) is equal to 7.2 % stroke rate/year from a score of 5  Above score calculated as 1 point each if present [CHF, HTN, DM, Vascular=MI/PAD/Aortic Plaque, Age if 65-74, or Male] Above score calculated as 2 points each if present [Age > 75, or Stroke/TIA/TE]   # PVCs: Continue metoprolol.  # Pulmonary embolism: # Pulmonary hypertension: # L LE DVT:  Occurred in the setting of knee replacement.  Resolved.  He is on Xarelto for atrial fibrillation.    # CAD:  # Hyperlipidemia:   Theodore [name] has a known, 70% ostial LAD lesion. He does have exertional shortness of breath.  However it seems that this is more related to his asthma/COPD.  Symptoms seem to be improving with the addition of Symbicort.  He plans to see Dr. Annamaria Boots in 2 months.  If he has persistent symptoms despite treatment we will consider cardiac catheterization at that time.  He is not on aspirin or P2Y12 ihnibitor because he is on Xarelto. Continue atorvastatin, Zetia and metoprolol.  LDL 53 07/2017.   # Chronic systolic and diastolic heart failure: # Hypertension:  LVEF improved to 50% on 03/2018.  Continue metoprolol and losartan.        Current medicines are reviewed at length with the patient today.  The patient  does not have concerns regarding medicines.  The  following changes have been made: none  Labs/ tests ordered today include:   No orders of the defined types were placed in this encounter.    Disposition:   FU with Lindsee Labarre C. [name], MD, [facility] in 1 year.     Signed, Corisa Montini C. [name], MD, [facility]  10/19/2018 5:57 PM    [place]

## 2018-10-30 ENCOUNTER — Encounter: Payer: Self-pay | Admitting: Family Medicine

## 2018-10-30 DIAGNOSIS — R195 Other fecal abnormalities: Secondary | ICD-10-CM

## 2018-11-01 NOTE — Telephone Encounter (Addendum)
Noted.  Notified pt by phone.  Pt requests test be mailed.  Kit mailed to pt.

## 2018-11-01 NOTE — Telephone Encounter (Signed)
Spoke with pt asking about dark stools.  Says he noticed about 1 week ago.  Denies any other sxs (N/V/D, blood in stool, pain, etc.).   Offered OV but pt declines stating he was just seen in 07/2018 and did not have iFOB test ordered like he usually does.  He prefers to do stool test first and go from there.

## 2018-11-01 NOTE — Telephone Encounter (Signed)
Ok to pick up stool kit - ordered.

## 2018-11-01 NOTE — Addendum Note (Signed)
Addended by: [name] on: 11/01/2018 02:00 PM   Modules accepted: Orders

## 2018-11-11 ENCOUNTER — Telehealth: Payer: Self-pay

## 2018-11-11 ENCOUNTER — Other Ambulatory Visit (INDEPENDENT_AMBULATORY_CARE_PROVIDER_SITE_OTHER): Payer: Medicare Other

## 2018-11-11 DIAGNOSIS — R195 Other fecal abnormalities: Secondary | ICD-10-CM

## 2018-11-11 LAB — FECAL OCCULT BLOOD, IMMUNOCHEMICAL: Fecal Occult Bld: POSITIVE — AB

## 2018-11-11 NOTE — Telephone Encounter (Signed)
Lori from [employer] called with Positive iFOB for this patient.   Forwarding to Dr Damita Dunnings in Dr Gutierrez's absence this afternoon.

## 2018-11-12 NOTE — Telephone Encounter (Signed)
Noted! Routed to PCP 

## 2018-11-12 NOTE — Telephone Encounter (Addendum)
Spoke with patient. He states stools are not as dark as they were, firm normal consistency stools. No abd pain or unexpected weight loss.  Prior saw [facility] Dr - will re refer

## 2018-11-12 NOTE — Telephone Encounter (Signed)
Noted.  See result note.  

## 2018-11-12 NOTE — Addendum Note (Signed)
Addended by: [name] on: 11/12/2018 06:15 PM   Modules accepted: Orders

## 2018-11-15 ENCOUNTER — Other Ambulatory Visit (INDEPENDENT_AMBULATORY_CARE_PROVIDER_SITE_OTHER): Payer: Medicare Other

## 2018-11-15 DIAGNOSIS — R195 Other fecal abnormalities: Secondary | ICD-10-CM

## 2018-11-15 LAB — CBC WITH DIFFERENTIAL/PLATELET
Basophils Absolute: 0 10*3/uL (ref 0.0–0.1)
Basophils Relative: 0.7 % (ref 0.0–3.0)
Eosinophils Absolute: 0.1 10*3/uL (ref 0.0–0.7)
Eosinophils Relative: 0.8 % (ref 0.0–5.0)
HCT: 43.1 % (ref 39.0–52.0)
Hemoglobin: 14.7 g/dL (ref 13.0–17.0)
Lymphocytes Relative: 33.1 % (ref 12.0–46.0)
Lymphs Abs: 2.5 10*3/uL (ref 0.7–4.0)
MCHC: 34 g/dL (ref 30.0–36.0)
MCV: 100.3 fl — ABNORMAL HIGH (ref 78.0–100.0)
Monocytes Absolute: 0.7 10*3/uL (ref 0.1–1.0)
Monocytes Relative: 9.8 % (ref 3.0–12.0)
Neutro Abs: 4.2 10*3/uL (ref 1.4–7.7)
Neutrophils Relative %: 55.6 % (ref 43.0–77.0)
Platelets: 170 10*3/uL (ref 150.0–400.0)
RBC: 4.29 Mil/uL (ref 4.22–5.81)
RDW: 13.3 % (ref 11.5–15.5)
WBC: 7.6 10*3/uL (ref 4.0–10.5)

## 2018-11-18 ENCOUNTER — Other Ambulatory Visit: Payer: Self-pay | Admitting: Gastroenterology

## 2018-11-18 ENCOUNTER — Telehealth: Payer: Self-pay | Admitting: *Deleted

## 2018-11-18 NOTE — Telephone Encounter (Signed)
   [facility] Pre-operative Risk Assessment    Request for surgical clearance:  1. What type of surgery is being performed? COLONOSCOPY/ENDOSCOPY   2. When is this surgery scheduled? 12/02/18   3. What type of clearance is required (medical clearance vs. Pharmacy clearance to hold med vs. Both)? BOTH  4. Are there any medications that need to be held prior to surgery and how long? XARELTO HOLD 2 DAYS PRIOR    5. Practice name and name of physician performing surgery? [facility]; DR. [name]   6. What is your office phone number [phone]    7.   What is your office fax number [phone]  8.   Anesthesia type (None, local, MAC, general) ? PROPOFOL    Julaine Hua 11/18/2018, 5:38 PM  _________________________________________________________________   (provider comments below)

## 2018-11-19 NOTE — Telephone Encounter (Signed)
Pt takes Xarelto for afib with CHADS2VASc score of 5 (age x2, CHF, HTN, CAD). Pt previously held [place] for 2 days prior to a knee replacement in December 2018. He resumed Xarelto afterwards, then held and changed to aspirin due to bleeding at the incision site. Then he developed a left lower extremity DVT and PE so he was resumed on Xarelto. Renal function is normal. Recommend only holding Xarelto for 1 day prior to procedure due to previous DVT/PE in the setting of anticoagulation hold.

## 2018-11-22 NOTE — Telephone Encounter (Signed)
   Primary Cardiologist: Skeet Latch, MD  Chart reviewed as part of pre-operative protocol coverage. Patient was contacted 11/22/2018 in reference to pre-operative risk assessment for pending surgery as outlined below.  Theodore [name] was last seen on 10/19/2018 by Dr. [name].  Since that day, Theodore [name] has done well. He gets SOB with exertion, but no chest pain.   Therefore, based on ACC/AHA guidelines, the patient would be at acceptable risk for the planned procedure without further cardiovascular testing.   I will route this recommendation to the requesting party via Epic fax function and remove from pre-op pool.  Please call with questions. See our clinical pharmacist's recommendation regarding how long to hold Xarelto.   [place], [place] 11/22/2018, 2:23 PM

## 2018-11-29 ENCOUNTER — Other Ambulatory Visit (HOSPITAL_COMMUNITY)
Admission: RE | Admit: 2018-11-29 | Discharge: 2018-11-29 | Disposition: A | Discharge status: Home / Self Care | Payer: Medicare Other | Source: Ambulatory Visit | Attending: Gastroenterology | Admitting: Gastroenterology

## 2018-11-29 DIAGNOSIS — Z01812 Encounter for preprocedural laboratory examination: Secondary | ICD-10-CM | POA: Insufficient documentation

## 2018-11-29 DIAGNOSIS — Z20828 Contact with and (suspected) exposure to other viral communicable diseases: Secondary | ICD-10-CM | POA: Insufficient documentation

## 2018-11-30 ENCOUNTER — Other Ambulatory Visit: Payer: Self-pay

## 2018-11-30 ENCOUNTER — Encounter (HOSPITAL_COMMUNITY): Payer: Self-pay | Admitting: Emergency Medicine

## 2018-11-30 ENCOUNTER — Other Ambulatory Visit: Payer: Self-pay | Admitting: Cardiovascular Disease

## 2018-11-30 LAB — NOVEL CORONAVIRUS, NAA (HOSP ORDER, SEND-OUT TO REF LAB; TAT 18-24 HRS): SARS-CoV-2, NAA: NOT DETECTED

## 2018-11-30 NOTE — Progress Notes (Signed)
PRE-OP ENDO CALL COMPLETED.  PATIENT STATES HE WILL BE HOLDING HIS XARELTO 24 HOURS BEFORE PROCEDURE PER HIS CARDIOLOGISTS INSTRUCTION  CARDIAC CLEARANCE 10-8 Epic

## 2018-12-01 NOTE — Progress Notes (Signed)
Pre op call complete. Patient stats they have remained quarantined since COVID test and have not felt sick nor been around anyone sick. All questions addressed.

## 2018-12-02 ENCOUNTER — Other Ambulatory Visit: Payer: Self-pay

## 2018-12-02 ENCOUNTER — Encounter (HOSPITAL_COMMUNITY): Payer: Self-pay | Admitting: *Deleted

## 2018-12-02 ENCOUNTER — Encounter (HOSPITAL_COMMUNITY)
Admission: RE | Disposition: A | Discharge status: Home / Self Care | Payer: Self-pay | Source: Home / Self Care | Attending: Gastroenterology

## 2018-12-02 ENCOUNTER — Ambulatory Visit (HOSPITAL_COMMUNITY): Payer: Medicare Other | Admitting: Certified Registered Nurse Anesthetist

## 2018-12-02 ENCOUNTER — Ambulatory Visit (HOSPITAL_COMMUNITY)
Admission: RE | Admit: 2018-12-02 | Discharge: 2018-12-02 | Disposition: A | Discharge status: Home / Self Care | Payer: Medicare Other | Attending: Gastroenterology | Admitting: Gastroenterology

## 2018-12-02 DIAGNOSIS — D122 Benign neoplasm of ascending colon: Secondary | ICD-10-CM | POA: Insufficient documentation

## 2018-12-02 DIAGNOSIS — K5730 Diverticulosis of large intestine without perforation or abscess without bleeding: Secondary | ICD-10-CM | POA: Insufficient documentation

## 2018-12-02 DIAGNOSIS — Z86711 Personal history of pulmonary embolism: Secondary | ICD-10-CM | POA: Diagnosis not present

## 2018-12-02 DIAGNOSIS — K449 Diaphragmatic hernia without obstruction or gangrene: Secondary | ICD-10-CM | POA: Insufficient documentation

## 2018-12-02 DIAGNOSIS — J449 Chronic obstructive pulmonary disease, unspecified: Secondary | ICD-10-CM | POA: Insufficient documentation

## 2018-12-02 DIAGNOSIS — I480 Paroxysmal atrial fibrillation: Secondary | ICD-10-CM | POA: Insufficient documentation

## 2018-12-02 DIAGNOSIS — Z79899 Other long term (current) drug therapy: Secondary | ICD-10-CM | POA: Insufficient documentation

## 2018-12-02 DIAGNOSIS — Z96653 Presence of artificial knee joint, bilateral: Secondary | ICD-10-CM | POA: Insufficient documentation

## 2018-12-02 DIAGNOSIS — Z7901 Long term (current) use of anticoagulants: Secondary | ICD-10-CM | POA: Insufficient documentation

## 2018-12-02 DIAGNOSIS — I2510 Atherosclerotic heart disease of native coronary artery without angina pectoris: Secondary | ICD-10-CM | POA: Diagnosis not present

## 2018-12-02 DIAGNOSIS — K921 Melena: Secondary | ICD-10-CM | POA: Diagnosis not present

## 2018-12-02 DIAGNOSIS — K635 Polyp of colon: Secondary | ICD-10-CM | POA: Insufficient documentation

## 2018-12-02 DIAGNOSIS — D124 Benign neoplasm of descending colon: Secondary | ICD-10-CM | POA: Diagnosis not present

## 2018-12-02 DIAGNOSIS — R195 Other fecal abnormalities: Secondary | ICD-10-CM | POA: Diagnosis present

## 2018-12-02 DIAGNOSIS — Z7951 Long term (current) use of inhaled steroids: Secondary | ICD-10-CM | POA: Insufficient documentation

## 2018-12-02 DIAGNOSIS — K219 Gastro-esophageal reflux disease without esophagitis: Secondary | ICD-10-CM | POA: Diagnosis not present

## 2018-12-02 DIAGNOSIS — E785 Hyperlipidemia, unspecified: Secondary | ICD-10-CM | POA: Insufficient documentation

## 2018-12-02 DIAGNOSIS — E7800 Pure hypercholesterolemia, unspecified: Secondary | ICD-10-CM | POA: Insufficient documentation

## 2018-12-02 DIAGNOSIS — I10 Essential (primary) hypertension: Secondary | ICD-10-CM | POA: Insufficient documentation

## 2018-12-02 HISTORY — PX: ESOPHAGOGASTRODUODENOSCOPY (EGD) WITH PROPOFOL: SHX5813

## 2018-12-02 HISTORY — PX: COLONOSCOPY WITH PROPOFOL: SHX5780

## 2018-12-02 HISTORY — PX: POLYPECTOMY: SHX5525

## 2018-12-02 SURGERY — ESOPHAGOGASTRODUODENOSCOPY (EGD) WITH PROPOFOL
Anesthesia: Monitor Anesthesia Care

## 2018-12-02 MED ORDER — SODIUM CHLORIDE 0.9 % IV SOLN: INTRAVENOUS | Status: DC

## 2018-12-02 MED ORDER — PROPOFOL 10 MG/ML IV BOLUS
INTRAVENOUS | Status: DC | PRN
  Administered 2018-12-02: 30 mg via INTRAVENOUS

## 2018-12-02 MED ORDER — EPHEDRINE SULFATE-NACL 50-0.9 MG/10ML-% IV SOSY
PREFILLED_SYRINGE | INTRAVENOUS | Status: DC | PRN
  Administered 2018-12-02: 10 mg via INTRAVENOUS

## 2018-12-02 MED ORDER — LACTATED RINGERS IV SOLN
INTRAVENOUS | Status: DC
  Administered 2018-12-02: 11:00:00 via INTRAVENOUS

## 2018-12-02 MED ORDER — PROPOFOL 10 MG/ML IV BOLUS
INTRAVENOUS | Status: AC
  Filled 2018-12-02: qty 20

## 2018-12-02 MED ORDER — PROPOFOL 500 MG/50ML IV EMUL
INTRAVENOUS | Status: DC | PRN
  Administered 2018-12-02: 125 ug/kg/min via INTRAVENOUS

## 2018-12-02 MED ORDER — PROPOFOL 500 MG/50ML IV EMUL
INTRAVENOUS | Status: AC
  Filled 2018-12-02: qty 50

## 2018-12-02 SURGICAL SUPPLY — 24 items

## 2018-12-02 NOTE — Op Note (Signed)
[facility] Patient Name: Theodore [name] Procedure Date: 12/02/2018 MRN: [id number] Attending MD: Nancy Fetter Dr., MD Date of Birth: [birth year] CSN: [id number] Age: 84 Admit Type: Outpatient Procedure:                Colonoscopy Indications:              Heme positive stool Providers:                [name] L. Rudy Luhmann Dr., MD, Ashley Jacobs, RN, Marguerita Merles, Technician Referring MD:              Medicines:                Monitored Anesthesia Care Complications:            No immediate complications. Estimated Blood Loss:     Estimated blood loss: none. Procedure:                Pre-Anesthesia Assessment:                           - Prior to the procedure, a History and Physical                            was performed, and patient medications and                            allergies were reviewed. The patient's tolerance of                            previous anesthesia was also reviewed. The risks                            and benefits of the procedure and the sedation                            options and risks were discussed with the patient.                            All questions were answered, and informed consent                            was obtained. Prior Anticoagulants: The patient has                            taken Xarelto (rivaroxaban), last dose was 3 days                            prior to procedure. ASA Grade Assessment: III - A                            patient with severe systemic disease. After  reviewing the risks and benefits, the patient was                            deemed in satisfactory condition to undergo the                            procedure.                           - Prior to the procedure, a History and Physical                            was performed, and patient medications and                            allergies were reviewed. The patient's tolerance of                     previous anesthesia was also reviewed. The risks                            and benefits of the procedure and the sedation                            options and risks were discussed with the patient.                            All questions were answered, and informed consent                            was obtained. Prior Anticoagulants: The patient has                            taken Xarelto (rivaroxaban), last dose was 3 days                            prior to procedure. ASA Grade Assessment: III - A                            patient with severe systemic disease. After                            reviewing the risks and benefits, the patient was                            deemed in satisfactory condition to undergo the                            procedure.                           After obtaining informed consent, the colonoscope                            was passed under  direct vision. Throughout the                            procedure, the patient's blood pressure, pulse, and                            oxygen saturations were monitored continuously. The                            CF-HQ190L ([id number] Olympus colonoscope was                            introduced through the anus and advanced to the the                            cecum, identified by appendiceal orifice and                            ileocecal valve. The colonoscopy was performed                            without difficulty. The patient tolerated the                            procedure well. The quality of the bowel                            preparation was good. The ileocecal valve,                            appendiceal orifice, and rectum were photographed. Scope In: 11:04:44 AM Scope Out: 11:43:10 AM Scope Withdrawal Time: 0 hours 27 minutes 16 seconds  Total Procedure Duration: 0 hours 38 minutes 26 seconds  Findings:      The perianal and digital rectal examinations were normal.       Multiple small and large-mouthed diverticula were found in the sigmoid       colon and descending colon.      A 5 mm polyp was found in the proximal ascending colon. The polyp was       sessile. The polyp was removed with a cold snare. Resection and       retrieval were complete. The pathology specimen was placed into Bottle       Number 1.      A 3 mm polyp was found in the proximal descending colon. The polyp was       sessile. The polyp was removed with a cold snare. Resection and       retrieval were complete. The pathology specimen was placed into Bottle       Number 2.      A 5 mm polyp was found in the sigmoid colon. The polyp was sessile. The       polyp was removed with a hot snare. Resection and retrieval were       complete. The pathology specimen was placed into Bottle Number 3.      A 3 mm polyp was found in the sigmoid colon. The polyp was sessile. The  polyp was removed with a cold snare. Resection and retrieval were       complete. The pathology specimen was placed into Bottle Number 3.      The retroflexed view of the distal rectum and anal verge was normal and       showed no anal or rectal abnormalities. Impression:               - Diverticulosis in the sigmoid colon and in the                            descending colon.                           - One 5 mm polyp in the proximal ascending colon,                            removed with a cold snare. Resected and retrieved.                           - One 3 mm polyp in the proximal descending colon,                            removed with a cold snare. Resected and retrieved.                           - One 5 mm polyp in the sigmoid colon, removed with                            a hot snare. Resected and retrieved.                           - One 3 mm polyp in the sigmoid colon, removed with                            a cold snare. Resected and retrieved.                           - The distal rectum and anal verge  are normal on                            retroflexion view. Moderate Sedation:      MAC by anesthesia Recommendation:           - Patient has a contact number available for                            emergencies. The signs and symptoms of potential                            delayed complications were discussed with the                            patient. Return to normal activities tomorrow.  Written discharge instructions were provided to the                            patient.                           - Resume previous diet.                           - Continue present medications.                           - Resume Xarelto (rivaroxaban) at prior dose in 4                            days.                           - Repeat colonoscopy for surveillance based on                            pathology results. Procedure Code(s):        --- Professional ---                           [phone], Colonoscopy, flexible; with removal of                            tumor(s), polyp(s), or other lesion(s) by snare                            technique Diagnosis Code(s):        --- Professional ---                           K63.5, Polyp of colon                           R19.5, Other fecal abnormalities                           K57.30, Diverticulosis of large intestine without                            perforation or abscess without bleeding CPT copyright 2019 [employer] Association. All rights reserved. The codes documented in this report are preliminary and upon coder review may  be revised to meet current compliance requirements. Nancy Fetter Dr., MD 12/02/2018 11:58:22 AM This report has been signed electronically. Number of Addenda: 0

## 2018-12-02 NOTE — Discharge Instructions (Signed)
YOU HAD AN ENDOSCOPIC PROCEDURE TODAY: Refer to the procedure report and other information in the discharge instructions given to you for any specific questions about what was found during the examination. If this information does not answer your questions, please call [facility] office at [phone] to clarify.   YOU SHOULD EXPECT: Some feelings of bloating in the abdomen. Passage of more gas than usual. Walking can help get rid of the air that was put into your GI tract during the procedure and reduce the bloating. If you had a lower endoscopy (such as a colonoscopy or flexible sigmoidoscopy) you may notice spotting of blood in your stool or on the toilet paper. Some abdominal soreness may be present for a day or two, also.  DIET: Your first meal following the procedure should be a light meal and then it is ok to progress to your normal diet. A half-sandwich or bowl of soup is an example of a good first meal. Heavy or fried foods are harder to digest and may make you feel nauseous or bloated. Drink plenty of fluids but you should avoid alcoholic beverages for 24 hours. If you had a esophageal dilation, please see attached instructions for diet.   ACTIVITY: Your care partner should take you home directly after the procedure. You should plan to take it easy, moving slowly for the rest of the day. You can resume normal activity the day after the procedure however YOU SHOULD NOT DRIVE, use power tools, machinery or perform tasks that involve climbing or major physical exertion for 24 hours (because of the sedation medicines used during the test).   SYMPTOMS TO REPORT IMMEDIATELY: A gastroenterologist can be reached at any hour. Please call [phone]  for any of the following symptoms:   Following lower endoscopy (colonoscopy, flexible sigmoidoscopy) Excessive amounts of blood in the stool  Significant tenderness, worsening of abdominal pains  Swelling of the abdomen that is new, acute  Fever of 100  or higher   Following upper endoscopy (EGD, EUS, ERCP, esophageal dilation) Vomiting of blood or coffee ground material  New, significant abdominal pain  New, significant chest pain or pain under the shoulder blades  Painful or persistently difficult swallowing  New shortness of breath  Black, tarry-looking or red, bloody stools  FOLLOW UP:  If any biopsies were taken you will be contacted by phone or by letter within the next 1-3 weeks. Call [phone]  if you have not heard about the biopsies in 3 weeks.  Please also call with any specific questions about appointments or follow up tests.   No aspirin, ibuprofen or other NSAID medications for 5 days. Office will send note or call when pathology results are obtained. Colonoscopy will be repeated based on the pathology results.  Resume Xarelto in 4 days

## 2018-12-02 NOTE — H&P (Signed)
Subjective:   Patient is a 84 y.o. male presents with need for colonoscopy and endoscopy for evaluation of Hemoccult positive stool.  He has had previous A. fib and undergone ablation but has been on Xarelto.  He has not seen any visible blood in the stool.  Xarelto has been on hold for 3 days.. Procedure including risks and benefits discussed in office.  Patient Active Problem List   Diagnosis Date Noted  . CAP (community acquired pneumonia) 01/19/2018  . Thoracic aorta atherosclerosis ([place]) 01/19/2018  . Nonischemic cardiomyopathy ([place]) 03/03/2017  . Chronic anticoagulation 03/03/2017  . History of pulmonary embolism 03/03/2017  . Chronic knee pain after total replacement of left knee joint 02/13/2017  . S/P left TKA, distal mass excision 02/09/2017  . Obesity, Class I, BMI 30.0-34.9 (see actual BMI) 01/13/2017  . Peripheral edema   . History of rheumatic fever   . History of arthritis   . Tendonitis of elbow, left   . Health maintenance examination 02/06/2016  . Advanced care planning/counseling discussion 02/06/2016  . CAD (coronary artery disease), native coronary artery 09/28/2015  . Fatigue 08/07/2015  . Abnormal nuclear stress test 09/06/2014  . Chronic cough 08/24/2014  . Thrombocytopenia ([place]) 05/27/2013  . PVC's (premature ventricular contractions) 09/22/2012  . Osteoarthritis 06/18/2012  . ED (erectile dysfunction) of organic origin 12/29/2011  . Enlarged prostate with lower urinary tract symptoms (LUTS) 12/29/2011  . Reduced libido 12/29/2011  . Testicular hypofunction 12/29/2011  . Allergic-infective asthma 08/26/2011  . GERD (gastroesophageal reflux disease) 08/26/2011  . Paroxysmal atrial fibrillation ([place]) 01/21/2011  . Nocturia   . Right bundle branch block   . COPD mixed type ([place]) 12/28/2007  . HLD (hyperlipidemia) 11/27/2007  . Essential hypertension 11/27/2007   Past Medical History:  Diagnosis Date  . History of arthritis   . History of rheumatic  fever   . Hypercholesterolemia   . Hypertension   . Nocturia   . PAF (paroxysmal atrial fibrillation) [facility]) January 2013   Placed on Pradaxa. Did not require cardioversion; spontaneously converted  . Peripheral edema   . Right bundle branch block   . SOB (shortness of breath)   . Tendonitis of elbow, left     Past Surgical History:  Procedure Laterality Date  . ATRIAL FIBRILLATION ABLATION  12/19/2015  . BACK SURGERY  12/24/09   fusion C3-C4  . BACK SURGERY  2010   fusion L4-L5  . CARDIAC CATHETERIZATION  2009   NONOBSTRUCTIVE ATHERSCLEROTIC CORONARY DISEASE AND NORMAL  LV FUNCTION  . CARDIAC CATHETERIZATION N/A 09/08/2014   Procedure: Left Heart Cath and Coronary Angiography;  Surgeon: Belva Crome, MD;  Location: [place] [facility];  Service: Cardiovascular;  Laterality: N/A;  . CARDIAC CATHETERIZATION N/A 09/28/2015   Procedure: Left Heart Cath and Coronary Angiography;  Surgeon: Leonie Man, MD;  Location: [place] [facility];  Service: Cardiovascular;  Laterality: N/A;  . CARDIAC CATHETERIZATION N/A 09/28/2015   Procedure: Intravascular Pressure Wire/FFR Study;  Surgeon: Leonie Man, MD;  Location: [place] [facility];  Service: Cardiovascular;  Laterality: N/A;  . CARDIOVERSION N/A 10/23/2015   Procedure: CARDIOVERSION;  Surgeon: [name], MD;  Location: [name] END[facility];  Service: Cardiovascular;  Laterality: N/A;  . COLONOSCOPY  2013   per patient, rpt 5 yrs  . ELECTROPHYSIOLOGIC STUDY N/A 12/19/2015   Procedure: Atrial Fibrillation Ablation;  Surgeon: [name], MD;  Location: [place] [facility];  Service: Cardiovascular;  Laterality: N/A;  . KNEE ARTHROSCOPY Left  03/2016   Dr. [name]  . PATELLAR TENDON REPAIR Left 2008  . REPLACEMENT TOTAL KNEE Right 2006  . TOTAL KNEE ARTHROPLASTY Left 02/09/2017   Procedure: LEFT TOTAL KNEE ARTHROPLASTY, EXCISION LEFT DISTAL THIGH MASS;  Surgeon: [name], MD;  Location: [facility] OR[facility];  Service: Orthopedics;  Laterality:  Left;  90 mins  . TRICEPS TENDON REPAIR Left 2013    Medications Prior to Admission  Medication Sig Dispense Refill Last Dose  . acetaminophen (TYLENOL) 500 MG tablet Take 1 tablet (500 mg total) by mouth 3 (three) times daily as needed. 30 tablet 0 Past Week at Unknown time  . Ascorbic Acid (VITAMIN C PO) Take 500 mg by mouth daily.    12/01/2018 at Unknown time  . atorvastatin (LIPITOR) 40 MG tablet TAKE 1 TABLET DAILY (Patient taking differently: Take 40 mg by mouth daily. ) 90 tablet 3 12/02/2018 at Unknown time  . budesonide-formoterol (SYMBICORT) 160-4.5 MCG/ACT inhaler Inhale 2 puffs into the lungs 2 (two) times daily. 3 Inhaler 1 12/02/2018 at Unknown time  . Cholecalciferol (VITAMIN D3) 2000 units TABS Take 2,000 Units by mouth daily.   12/01/2018 at Unknown time  . ezetimibe (ZETIA) 10 MG tablet TAKE 1 TABLET DAILY (Patient taking differently: Take 10 mg by mouth daily. ) 90 tablet 3 12/02/2018 at Unknown time  . furosemide (LASIX) 40 MG tablet TAKE 1 TABLET DAILY (NEW DOSE) (Patient taking differently: Take 40 mg by mouth daily. ) 90 tablet 3 12/01/2018 at Unknown time  . losartan (COZAAR) 100 MG tablet TAKE 1 TABLET ONCE DAILY 90 tablet 3 12/01/2018 at Unknown time  . Lysine 500 MG TABS Take 500 tablets by mouth daily.    12/01/2018 at Unknown time  . metoprolol succinate (TOPROL-XL) 100 MG 24 hr tablet 1 daily (Patient taking differently: Take 100 mg by mouth daily. ) 90 tablet 3   . omeprazole (PRILOSEC) 20 MG capsule Take 20 mg by mouth daily.     12/01/2018 at Unknown time  . potassium chloride (K-DUR,KLOR-CON) 10 MEQ tablet TAKE 1 TABLET TWICE A DAY (Patient taking differently: Take 10 mEq by mouth 2 (two) times daily. ) 180 tablet 4 12/01/2018 at Unknown time  . XARELTO 20 MG TABS tablet TAKE 1 TABLET DAILY WITH SUPPER (Patient taking differently: Take 20 mg by mouth at bedtime. ) 90 tablet 1 11/29/2018  . Spacer/Aero-Holding Chambers (AEROCHAMBER MV) inhaler Use as instructed 1  each 0    Allergies  Allergen Reactions  . Penicillins Rash and Other (See Comments)    "Blistering rash" Has patient had a PCN reaction causing immediate rash, facial/tongue/throat swelling, SOB or lightheadedness with hypotension: Yes Has patient had a PCN reaction causing severe rash involving mucus membranes or skin necrosis: No Has patient had a PCN reaction that required hospitalization: No Has patient had a PCN reaction occurring within the last 10 years: No If all of the above answers are "NO", then may proceed with Cephalosporin use.   . Amlodipine Swelling and Other (See Comments)    Peripheral edema  . Lisinopril Cough  . Mevacor [Lovastatin] Other (See Comments)    Caused cataracts and elevated liver enzymes  . Vicodin [Hydrocodone-Acetaminophen] Nausea And Vomiting  . Zocor [Simvastatin] Other (See Comments)    LFT increase with simvastatin and lovastatin   . Oseltamivir Phosphate Itching and Rash    Social History   Tobacco Use  . Smoking status: Never Smoker  . Smokeless tobacco: Never Used  Substance Use Topics  .  Alcohol use: Yes    Alcohol/week: 14.0 standard drinks    Types: 14 Cans of beer per week    Comment: 2 beers daily    Family History  Problem Relation Age of Onset  . Heart attack Father   . COPD Father   . Hypertension Father   . Heart disease Brother   . COPD Brother   . Asthma Sister   . Stroke Neg Hx      Objective:   Patient Vitals for the past 8 hrs:  BP Temp Temp src Pulse Resp Height Weight  12/02/18 0957 (!) 161/69 97.6 F (36.4 C) Oral 61 20 5\' 11"  (1.803 m) 99.8 kg   No intake/output data recorded. No intake/output data recorded.   See MD Preop evaluation      Assessment:   1.  Blood in stool 2.  Paroxysmal atrial fib.  In sinus rhythm ever since ablation 3.  Anticoagulated Xarelto been on hold now for 3 days  Plan:   We will proceed with EGD and colonoscopy.  This is been discussed in detail with the patient  in the office.

## 2018-12-02 NOTE — Op Note (Signed)
[facility] Patient Name: Theodore [name] Procedure Date: 12/02/2018 MRN: [id number] Attending MD: Nancy Fetter Dr., MD Date of Birth: 25-Jun-1934 CSN: [id number] Age: 84 Admit Type: Outpatient Procedure:                Upper GI endoscopy Indications:              Heme positive stool Providers:                [name] L. Filicia Scogin Dr., MD, Ashley Jacobs, RN, Marguerita Merles, Technician, Lazaro Arms, Technician Referring MD:              Medicines:                Monitored Anesthesia Care Complications:            No immediate complications. Estimated Blood Loss:      Procedure:                Pre-Anesthesia Assessment:                           - Prior to the procedure, a History and Physical                            was performed, and patient medications and                            allergies were reviewed. The patient's tolerance of                            previous anesthesia was also reviewed. The risks                            and benefits of the procedure and the sedation                            options and risks were discussed with the patient.                            All questions were answered, and informed consent                            was obtained. Prior Anticoagulants: The patient has                            taken Xarelto (rivaroxaban), last dose was 3 days                            prior to procedure. ASA Grade Assessment: III - A                            patient with severe systemic disease. After  reviewing the risks and benefits, the patient was                            deemed in satisfactory condition to undergo the                            procedure.                           After obtaining informed consent, the endoscope was                            passed under direct vision. Throughout the                            procedure, the patient's blood pressure, pulse, and                             oxygen saturations were monitored continuously. The                            [id number]-[id number] ([id number]) Olympus gastroscope was                            introduced through the mouth, and advanced to the                            second part of duodenum. The upper GI endoscopy was                            accomplished without difficulty. The patient                            tolerated the procedure well. Scope In: Scope Out: Findings:      A medium-sized hiatal hernia was present.      The stomach was normal.      The examined duodenum was normal.      The examined duodenum was normal. Impression:               - Medium-sized hiatal hernia.                           - Normal stomach.                           - Normal examined duodenum.                           - Normal examined duodenum.                           - No specimens collected.                           - Blood in stool without cause found on endoscopic  exam Moderate Sedation:      MAC by anesthesia Recommendation:           - Patient has a contact number available for                            emergencies. The signs and symptoms of potential                            delayed complications were discussed with the                            patient. Return to normal activities tomorrow.                            Written discharge instructions were provided to the                            patient.                           - Resume previous diet.                           - See the other procedure note for documentation of                            additional recommendations. Procedure Code(s):        --- Professional ---                           [phone], Esophagogastroduodenoscopy, flexible,                            transoral; diagnostic, including collection of                            specimen(s) by brushing or washing, when performed                             (separate procedure) Diagnosis Code(s):        --- Professional ---                           R19.5, Other fecal abnormalities                           K44.9, Diaphragmatic hernia without obstruction or                            gangrene CPT copyright 2019 American Medical Association. All rights reserved. The codes documented in this report are preliminary and upon coder review may  be revised to meet current compliance requirements. Nancy Fetter Dr., MD 12/02/2018 11:53:30 AM This report has been signed electronically. Number of Addenda: 0

## 2018-12-02 NOTE — Transfer of Care (Signed)
Immediate Anesthesia Transfer of Care Note  Patient: Theodore [name]  Procedure(s) Performed: ESOPHAGOGASTRODUODENOSCOPY (EGD) WITH PROPOFOL (N/A ) COLONOSCOPY WITH PROPOFOL (N/A ) POLYPECTOMY  Patient Location: PACU  Anesthesia Type:MAC  Level of Consciousness: awake, drowsy and patient cooperative  Airway & Oxygen Therapy: Patient Spontanous Breathing and Patient connected to face mask oxygen  Post-op Assessment: Report given to RN and Post -op Vital signs reviewed and stable  Post vital signs: Reviewed and stable  Last Vitals:  Vitals Value Taken Time  BP    Temp    Pulse    Resp 13 12/02/18 1149  SpO2    Vitals shown include unvalidated device data.  Last Pain:  Vitals:   12/02/18 0957  TempSrc: Oral  PainSc: 0-No pain         Complications: No apparent anesthesia complications

## 2018-12-02 NOTE — Anesthesia Preprocedure Evaluation (Signed)
Anesthesia Evaluation  Patient identified by MRN, date of birth, ID band Patient awake    Reviewed: Allergy & Precautions, NPO status , Patient's Chart, lab work & pertinent test results, reviewed documented beta blocker date and time   Airway Mallampati: I       Dental no notable dental hx. (+) Teeth Intact   Pulmonary asthma ,    Pulmonary exam normal breath sounds clear to auscultation       Cardiovascular hypertension, Pt. on home beta blockers + CAD  Normal cardiovascular exam Rhythm:Regular Rate:Normal     Neuro/Psych    GI/Hepatic Neg liver ROS, GERD  Medicated and Controlled,  Endo/Other  negative endocrine ROS  Renal/GU negative Renal ROS     Musculoskeletal   Abdominal Normal abdominal exam  (+)   Peds  Hematology   Anesthesia Other Findings    1. The left ventricle has a visually estimated ejection fraction of of 50%. The cavity size was normal. There is mildly increased left ventricular wall thickness. Left ventricular diastolic Doppler parameters are consistent with impaired relaxation Left  ventricular diffuse hypokinesis.  2. The right ventricle has mildly reduced systolic function. The cavity was mildly enlarged. There is no increase in right ventricular wall thickness.  3. Left atrial size was mildly dilated.  4. Right atrial size was mildly dilated.  5. The mitral valve is normal in structure. No evidence of mitral valve stenosis. Trivial regurgitation.  6. The tricuspid valve is normal in structure.  7. The aortic valve is tricuspid There is mild calcification of the aortic valve. Aortic valve regurgitation is trivial by color flow Doppler. No stenosis.  8. The pulmonic valve was normal in structure.  9. There is mild dilatation of the aortic root measuring 38 mm. 10. Right atrial pressure is estimated at 3 mmHg. 11. No complete TR doppler jet so unable to estimate PA systolic pressure.   Reproductive/Obstetrics                             Anesthesia Physical Anesthesia Plan  ASA: III  Anesthesia Plan: MAC   Post-op Pain Management:    Induction:   PONV Risk Score and Plan:   Airway Management Planned: Nasal Cannula, Mask and Natural Airway  Additional Equipment: None  Intra-op Plan:   Post-operative Plan:   Informed Consent: I have reviewed the patients History and Physical, chart, labs and discussed the procedure including the risks, benefits and alternatives for the proposed anesthesia with the patient or authorized representative who has indicated his/her understanding and acceptance.     Dental advisory given  Plan Discussed with: CRNA  Anesthesia Plan Comments:         Anesthesia Quick Evaluation

## 2018-12-03 ENCOUNTER — Encounter (HOSPITAL_COMMUNITY): Payer: Self-pay | Admitting: Gastroenterology

## 2018-12-03 LAB — SURGICAL PATHOLOGY

## 2018-12-03 NOTE — Anesthesia Postprocedure Evaluation (Signed)
Anesthesia Post Note  Patient: Theodore [name]  Procedure(s) Performed: ESOPHAGOGASTRODUODENOSCOPY (EGD) WITH PROPOFOL (N/A ) COLONOSCOPY WITH PROPOFOL (N/A ) POLYPECTOMY     Patient location during evaluation: Endoscopy Anesthesia Type: MAC Level of consciousness: awake Pain management: pain level controlled Vital Signs Assessment: post-procedure vital signs reviewed and stable Respiratory status: spontaneous breathing Cardiovascular status: stable Postop Assessment: no apparent nausea or vomiting Anesthetic complications: no    Last Vitals:  Vitals:   12/02/18 1150 12/02/18 1200  BP: 111/62 129/71  Pulse:  (!) 57  Resp: 13 15  Temp: (!) 36.4 C   SpO2:  95%    Last Pain:  Vitals:   12/02/18 1200  TempSrc:   PainSc: 0-No pain   Pain Goal:                   Huston Foley

## 2018-12-04 ENCOUNTER — Encounter: Payer: Self-pay | Admitting: Family Medicine

## 2018-12-04 DIAGNOSIS — R195 Other fecal abnormalities: Secondary | ICD-10-CM | POA: Insufficient documentation

## 2018-12-13 ENCOUNTER — Encounter: Payer: Self-pay | Admitting: Internal Medicine

## 2018-12-13 ENCOUNTER — Other Ambulatory Visit: Payer: Self-pay

## 2018-12-13 ENCOUNTER — Ambulatory Visit (INDEPENDENT_AMBULATORY_CARE_PROVIDER_SITE_OTHER): Payer: Medicare Other | Admitting: Internal Medicine

## 2018-12-13 DIAGNOSIS — J449 Chronic obstructive pulmonary disease, unspecified: Secondary | ICD-10-CM | POA: Diagnosis not present

## 2018-12-13 DIAGNOSIS — I480 Paroxysmal atrial fibrillation: Secondary | ICD-10-CM

## 2018-12-13 MED ORDER — BREZTRI AEROSPHERE 160-9-4.8 MCG/ACT IN AERO: 2.0000 | INHALATION_SPRAY | Freq: Two times a day (BID) | RESPIRATORY_TRACT | 1 refills | Status: DC

## 2018-12-13 MED ORDER — BREZTRI AEROSPHERE 160-9-4.8 MCG/ACT IN AERO: 2.0000 | INHALATION_SPRAY | Freq: Two times a day (BID) | RESPIRATORY_TRACT | 0 refills | Status: DC

## 2018-12-13 NOTE — Assessment & Plan Note (Signed)
DLCO is well preserved, so this is mostly a chronic bronchitis pattern. He didn't like the Anoro dispenser. We will see if triple therapy makes a difference for him using the [facility] device, which works like is Symbicort. He will have some shortness of breath based on his COPD, but I can't tell him there isn't acardiac component as well.

## 2018-12-13 NOTE — Progress Notes (Signed)
Subjective:    Patient ID: Theodore [name], male    DOB: 05/28/1934, 84 y.o.   MRN: [id number] HPI male never smoker followed for COPD/bronchitis, complicated by HBP, peripheral edema, GERD, A. Fib/ ablation, CAD, PE/DVT,  PFT 2011 FEV1/FVC 0.65.    PFT 11/30/13- moderate obstruction, insignificant response to bronchodilator, airtrapping, minimal diffusion defect. FEV1 2.21/ 70%, FEV1/FVC 0.63, TLC 97%, DLCO 78%. EKG 03/19/2018-sinus bradycardia with frequent multifocal PVCs/ventricular trigeminy -----------------------------------------------------------------------------------------  08/23/2018-  84 year old male never smoker followed for COPD/bronchitis, complicated by HBP, peripheral edema, GERD, A. Fib/ Xarelto, PE/DVT (2019), CAD Spiriva, Xarelto Saw PCP for persistent cough after pneumonia and was to try daily claritin, mucinex. Seen here in March by NP who gave prednisone, Zyrtec. PFT was ordered by blocked by Covid restriction at that time. Anoro not helping- feels he can't inhale it deeply. ProAir not helping. He describes minimal cough- dry or scant white. Note metoprolol, losartan not high risk for this. Notes easier dyspnea on exertion, avoiding stairs. Hard to walk and play golf outdoors esp with current hot, humid weather. No acute events.    Persistent deep tight/ ache mid back. CXR 03/19/2018-  No active cardiopulmonary disease.  12/12/1008- 84 year old male never smoker followed for COPD/bronchitis, complicated by HBP, peripheral edema, GERD, A. Fib/ Xarelto, PE/DVT (2019), CAD Spiriva, Xarelto Symbicort 160/ spacer,  -----2 month f/u Symbicort device works better than Anoro- coughs productively clear phelgm after morning use especially. Stable DOE, stairs are hard. Little change. Covid discussion. PFT 10/15/2018- Moderate obstruction with response to BD, Airtrapping, Normal Diffusion (Chronic bronchitis pattern)  ROS-see HPI + = positive Constitutional:   No-   weight loss,  night sweats, fevers, chills, fatigue, lassitude. HEENT:   No-  headaches, difficulty swallowing, tooth/dental problems, sore throat,       No-  sneezing, itching, ear ache, nasal congestion, post nasal drip,  CV:  No-   chest pain, orthopnea, PND, swelling in lower extremities, anasarca, dizziness, +palpitations Resp: + shortness of breath with exertion or at rest.              +  productive cough,  +non-productive cough,  No- coughing up of blood.              No-   change in color of mucus.  +wheezing.   Skin: No-   rash or lesions. GI:  No-   heartburn, indigestion, abdominal pain, nausea, vomiting,  GU:  MS:  No-   joint pain or swelling.  Neuro-     nothing unusual Psych:  No- change in mood or affect. No depression or anxiety.  No memory loss.  OBJ- Physical Exam General- Alert, Oriented, Affect-appropriate, Distress- none acute, looks fit and trim Skin- +ecchymoses on arms  Lymphadenopathy- none Head- atraumatic            Eyes- Gross vision intact, PERRLA, conjunctivae and secretions clear            Ears- Hearing, canals-normal            Nose- Clear, no-Septal dev, mucus, polyps, erosion, perforation             Throat- Mallampati II , mucosa clear , drainage- none, tonsils- atrophic,  Neck- flexible , trachea midline, no stridor , thyroid nl, carotid no bruit Chest - symmetrical excursion , unlabored           Heart/CV- RRR , no murmur , no gallop  , no rub, nl s1 s2                           -  JVD- none , edema- none, stasis changes- none, varices- none           Lung-  Clear, wheeze- none, cough- none here, dullness-none, rub- none           Chest wall-  Abd-  Br/ Gen/ Rectal- Not done, not indicated Extrem- cyanosis- none, clubbing, none, atrophy- none, strength- nl Neuro- grossly intact to observation

## 2018-12-13 NOTE — Patient Instructions (Signed)
Sample  And printed script for Breztri inhaler    Inhale 2 puffs then rinse mouth, twice daily  Try this instead of Symbicort. If you like it better, take the printed script to your drug store to see what it would cost.  Please call if we can help

## 2018-12-13 NOTE — Assessment & Plan Note (Signed)
He continues to work with cardiology, Rhythm c/w NSR on exam today.

## 2018-12-29 ENCOUNTER — Other Ambulatory Visit: Payer: Self-pay

## 2018-12-29 ENCOUNTER — Encounter: Payer: Self-pay | Admitting: Cardiovascular Disease

## 2018-12-29 ENCOUNTER — Ambulatory Visit (INDEPENDENT_AMBULATORY_CARE_PROVIDER_SITE_OTHER): Payer: Medicare Other | Admitting: Cardiovascular Disease

## 2018-12-29 VITALS — BP 120/79 | HR 76 | Temp 96.3°F | Ht 71.0 in | Wt 218.4 lb

## 2018-12-29 DIAGNOSIS — I10 Essential (primary) hypertension: Secondary | ICD-10-CM

## 2018-12-29 DIAGNOSIS — I480 Paroxysmal atrial fibrillation: Secondary | ICD-10-CM | POA: Diagnosis not present

## 2018-12-29 DIAGNOSIS — Z01812 Encounter for preprocedural laboratory examination: Secondary | ICD-10-CM | POA: Diagnosis not present

## 2018-12-29 DIAGNOSIS — I208 Other forms of angina pectoris: Secondary | ICD-10-CM | POA: Diagnosis not present

## 2018-12-29 DIAGNOSIS — R0602 Shortness of breath: Secondary | ICD-10-CM

## 2018-12-29 DIAGNOSIS — Z7901 Long term (current) use of anticoagulants: Secondary | ICD-10-CM

## 2018-12-29 NOTE — Patient Instructions (Addendum)
Medication Instructions:  [place] 2 DAYS PRIOR TO YOUR CATH  *If you need a refill on your cardiac medications before your next appointment, please call your pharmacy*  Lab Work: CBC/BMET AT [facility] OFFICE Wednesday 11/25  COVID SCREENING AT [facility] 11/27 AT 8:25 AM   If you have labs (blood work) drawn today and your tests are completely normal, you will receive your results only by: [name] MyChart Message (if you have MyChart) OR . A paper copy in the mail If you have any lab test that is abnormal or we need to change your treatment, we will call you to review the results.  Testing/Procedures: Your physician has requested that you have a cardiac catheterization. Cardiac catheterization is used to diagnose and/or treat various heart conditions. Doctors may recommend this procedure for a number of different reasons. The most common reason is to evaluate chest pain. Chest pain can be a symptom of coronary artery disease (CAD), and cardiac catheterization can show whether plaque is narrowing or blocking your heart's arteries. This procedure is also used to evaluate the valves, as well as measure the blood flow and oxygen levels in different parts of your heart. For further information please visit [email or web address]. Please follow instruction sheet, as given.  Follow-Up: At [facility], you and your health needs are our priority.  As part of our continuing mission to provide you with exceptional heart care, we have created designated Provider Care Teams.  These Care Teams include your primary Cardiologist (physician) and Advanced Practice Providers (APPs -  Physician Assistants and Nurse Practitioners) who all work together to provide you with the care you need, when you need it.  Your next appointment:   6 month(s) You will receive a reminder letter in the mail two months in advance. If you don't receive a letter, please call our office to schedule the follow-up  appointment.   The format for your next appointment:   Either In Person or Virtual  Provider:   You may see Skeet Latch, MD or one of the following Advanced Practice Providers on your designated Care Team:    Kerin Ransom, PA-C  [place], [place]  Coletta Memos, [place]   Other Instructions     [place] [address] Dept: [phone] Loc: [place]  12/29/2018  You are scheduled for a Cardiac Catheterization on Monday, November 30 with Dr. [name].  1. Please arrive at the [facility] (Main Entrance A) at [facility]: [address] [place], [place] [address] at 7:00 AM (This time is two hours before your procedure to ensure your preparation). Free valet parking service is available.   Special note: Every effort is made to have your procedure done on time. Please understand that emergencies sometimes delay scheduled procedures.  2. Diet: Do not eat solid foods after midnight.  The patient may have clear liquids until 5am upon the day of the procedure.  3. Labs: You will need to have blood drawn on Wednesday, November 25 at [place]  Open: 8am - 5pm (Lunch 12:30 - 1:30)   Phone: [phone]. You do not need to be fasting.  GO 11/27 FOR YOUR COVID SCREENING   4. Medication instructions in preparation for your procedure:   Contrast Allergy: No  HOLD XARELTO 2 DAYS PRIOR TO CATH   NO LASIX (FUROSEMIDE) MORNING OF  CATH   On the morning of your procedure, take your morning medicines NOT listed above.  You may use sips of water.  5. Plan for one night stay--bring personal belongings. 6. Bring a current list of your medications and current insurance cards. 7. You MUST have a responsible person to drive you home. 8. Someone MUST be with you the first 24 hours after you arrive home  or your discharge will be delayed. 9. Please wear clothes that are easy to get on and off and wear slip-on shoes.  Thank you for allowing [place] to care for you!   -- [facility]

## 2018-12-29 NOTE — Progress Notes (Signed)
Cardiology Office Note   Date:  12/29/2018   ID:  Theodore [name], DOB [birth year], MRN [id number]  PCP:  [name], MD  Cardiologist:   Skeet Latch, MD   No chief complaint on file.    History of Present Illness: Theodore [name] is a 84 y.o. male with CAD, chronic systolic and diastolic heart failure with improved LVEF, hypertension, hyperlipidemia, paroxysmal atrial fibrillation, asthma and Rheumatic fever who presents for follow up.  [name] was previously a patient of Dr. Mare Ferrari.  [name] was diagnosed with atrial fibrillation in 2014.  He was initially on Pradaxa and switched to Xarelto.  He had an episode of chest pain 08/2014 and had a Lexiscan Myoview 08/31/14 that showed an old inferior scar with peri-infarct ischemia and LVEF 49%.  He then had a LHC that revealed 70% ostial LAD, 50% prox LAD, 30% OM1 and 35% RCA.  He was managed medically.  He subsequently had an echo 09/2014 that showed LVEF 55-60% with mild MR and AR and grade 1 diastolic dysfunction.  At that time atorvastatin was increased to 80 mg but he noted leg pain.  He was seen in the lipid clinic and started back on atorvastatin 40mg  and zetia 10 mg.    [name] was seen in clinic 09/25/15 and reported significant shortness of breath with minimal exertion.  He had a LHC with Dr. Ellyn Hack on 09/28/15 that again revealed a 70% ostial-proximal LAD lesion.  Dr. Ellyn Hack performed FFR on that lesion and there was no significant change.  The left ventriculogram performed at that time revealed LVEF 35-45%.  Mr. Cockayne continued to have exertional dyspnea so he was referred for [facility] 10/17/15 that was negative for ischemia. He subsequently underwent DCCV on 10/23/15.  He underwent ablation on 12/19/15 with Dr. [name] and has remained in sinus rhythm.    [name] underwent L knee replacement on  02/09/17.  Xarelto was held for 2 days prior to surgery and resumed post-operatively.  He developed bleeding at the  incision site so Xarelto was again held and he was switched to aspirin.  He subsequently developed a left lower extremity DVT and pulmonary embolism.  Xarelto was resumed.  He had an echocardiogram 03/04/17 that revealed LVEF 50-55% with moderate to severe pulmonary arterial hypertension with a PASP of 60 mmHg. He had pneumonia 12/2017 and subsequently developed a cough.  He developed shortness of breath and saw both Kerin Ransom, PA-C and Dr. Annamaria Boots in pulmonary.  BNP was 61 and his symptoms were not felt to be due to heart failure.  He ultimately started on Symbicort and thinks that in the last month his breathing is started to improve.  He had PFTs obtained 10/2018 that showed moderate obstructive lung disease consistent with chronic bronchitis.  He followed up with Dr. Annamaria Boots 12/2018 at which time he was started on [facility].  He does think this helped somewhat.  However he is unable to get it through his insurance.  It seemed to clear the phlegm and cough.  However he continues to be very short of breath and tired.  He gets short of breath with minimal exertion such as walking up just a few stairs.  He is very frustrated that he is unable to play golf.  He has no exertional chest pain.  Lately he has not noticed any lower extremity edema, orthopnea, or PND.  He wonders if there could be progression of his coronary disease.   Past  Medical History:  Diagnosis Date  . History of arthritis   . History of rheumatic fever   . Hypercholesterolemia   . Hypertension   . Nocturia   . PAF (paroxysmal atrial fibrillation) [facility]) January 2013   Placed on Pradaxa. Did not require cardioversion; spontaneously converted  . Peripheral edema   . Right bundle branch block   . SOB (shortness of breath)   . Tendonitis of elbow, left     Past Surgical History:  Procedure Laterality Date  . ATRIAL FIBRILLATION ABLATION  12/19/2015  . BACK SURGERY  12/24/09   fusion C3-C4  . BACK SURGERY  2010   fusion L4-L5  .  CARDIAC CATHETERIZATION  2009   NONOBSTRUCTIVE ATHERSCLEROTIC CORONARY DISEASE AND NORMAL  LV FUNCTION  . CARDIAC CATHETERIZATION N/A 09/08/2014   Procedure: Left Heart Cath and Coronary Angiography;  Surgeon: Belva Crome, MD;  Location: [place] CV LAB;  Service: Cardiovascular;  Laterality: N/A;  . CARDIAC CATHETERIZATION N/A 09/28/2015   Procedure: Left Heart Cath and Coronary Angiography;  Surgeon: Leonie Man, MD;  Location: [place] CV LAB;  Service: Cardiovascular;  Laterality: N/A;  . CARDIAC CATHETERIZATION N/A 09/28/2015   Procedure: Intravascular Pressure Wire/FFR Study;  Surgeon: Leonie Man, MD;  Location: [place] CV LAB;  Service: Cardiovascular;  Laterality: N/A;  . CARDIOVERSION N/A 10/23/2015   Procedure: CARDIOVERSION;  Surgeon: [name], MD;  Location: [facility] ENDOSCOPY;  Service: Cardiovascular;  Laterality: N/A;  . COLONOSCOPY  2013   per patient, rpt 5 yrs  . COLONOSCOPY WITH PROPOFOL N/A 12/02/2018   TAx2, HP, diverticulosis [name], MD)  . ELECTROPHYSIOLOGIC STUDY N/A 12/19/2015   Procedure: Atrial Fibrillation Ablation;  Surgeon: [name], MD;  Location: [place] CV LAB;  Service: Cardiovascular;  Laterality: N/A;  . ESOPHAGOGASTRODUODENOSCOPY (EGD) WITH PROPOFOL N/A 12/02/2018   medium HH [name], MD)  . KNEE ARTHROSCOPY Left 03/2016   Dr. [name]  . PATELLAR TENDON REPAIR Left 2008  . POLYPECTOMY  12/02/2018   Procedure: POLYPECTOMY;  Surgeon: [name], MD;  Location: [facility] ENDOSCOPY;  Service: Endoscopy;;  . REPLACEMENT TOTAL KNEE Right 2006  . TOTAL KNEE ARTHROPLASTY Left 02/09/2017   Procedure: LEFT TOTAL KNEE ARTHROPLASTY, EXCISION LEFT DISTAL THIGH MASS;  Surgeon: [name], MD;  Location: [facility] ORS;  Service: Orthopedics;  Laterality: Left;  90 mins  . TRICEPS TENDON REPAIR Left 2013     Current Outpatient Medications  Medication Sig Dispense Refill  . acetaminophen (TYLENOL) 500 MG tablet Take 1 tablet  (500 mg total) by mouth 3 (three) times daily as needed. 30 tablet 0  . Ascorbic Acid (VITAMIN C PO) Take 500 mg by mouth daily.     [name] atorvastatin (LIPITOR) 40 MG tablet TAKE 1 TABLET DAILY (Patient taking differently: Take 40 mg by mouth daily. ) 90 tablet 3  . budesonide-formoterol (SYMBICORT) 160-4.5 MCG/ACT inhaler Inhale 2 puffs into the lungs 2 (two) times daily. 3 Inhaler 1  . Cholecalciferol (VITAMIN D3) 2000 units TABS Take 2,000 Units by mouth daily.    [name] ezetimibe (ZETIA) 10 MG tablet TAKE 1 TABLET DAILY (Patient taking differently: Take 10 mg by mouth daily. ) 90 tablet 3  . furosemide (LASIX) 40 MG tablet TAKE 1 TABLET DAILY (NEW DOSE) (Patient taking differently: Take 40 mg by mouth daily. ) 90 tablet 3  . losartan (COZAAR) 100 MG tablet TAKE 1 TABLET ONCE DAILY (Patient taking differently: Take 100 mg by mouth daily. ) 90  tablet 3  . Lysine 500 MG TABS Take 500 tablets by mouth daily.     . metoprolol succinate (TOPROL-XL) 100 MG 24 hr tablet 1 daily (Patient taking differently: Take 100 mg by mouth daily. ) 90 tablet 3  . omeprazole (PRILOSEC) 20 MG capsule Take 20 mg by mouth daily.      . potassium chloride (K-DUR,KLOR-CON) 10 MEQ tablet TAKE 1 TABLET TWICE A DAY (Patient taking differently: Take 10 mEq by mouth 2 (two) times daily. ) 180 tablet 4  . Spacer/Aero-Holding Chambers (AEROCHAMBER MV) inhaler Use as instructed 1 each 0  . XARELTO 20 MG TABS tablet TAKE 1 TABLET DAILY WITH SUPPER (Patient taking differently: Take 20 mg by mouth at bedtime. ) 90 tablet 1   No current facility-administered medications for this visit.     Allergies:   Penicillins, Amlodipine, Lisinopril, Mevacor [lovastatin], Vicodin [hydrocodone-acetaminophen], Zocor [simvastatin], and Oseltamivir phosphate    Social History:  The patient  reports that he has never smoked. He has never used smokeless tobacco. He reports current alcohol use of about 14.0 standard drinks of alcohol per week. He reports  that he does not use drugs.   Family History:  The patient's family history includes Asthma in his sister; COPD in his brother and father; Heart attack in his father; Heart disease in his brother; Hypertension in his father.    ROS:  Please see the history of present illness.   Otherwise, review of systems are positive for none.   All other systems are reviewed and negative.    PHYSICAL EXAM: VS:  BP 120/79   Pulse 76   Temp (!) 96.3 F (35.7 C)   Ht 5\' 11"  (1.803 m)   Wt 218 lb 6.4 oz (99.1 kg)   SpO2 98%   BMI 30.46 kg/m  , BMI Body mass index is 30.46 kg/m. GENERAL:  Well appearing HEENT: Pupils equal round and reactive, fundi not visualized, oral mucosa unremarkable NECK:  No jugular venous distention, waveform within normal limits, carotid upstroke brisk and symmetric, no bruits LUNGS:  Clear to auscultation bilaterally.  Prolonged expiratory phase. HEART:  RRR.  PMI not displaced or sustained,S1 and S2 within normal limits, no S3, no S4, no clicks, no rubs, no murmurs ABD:  Flat, positive bowel sounds normal in frequency in pitch, no bruits, no rebound, no guarding, no midline pulsatile mass, no hepatomegaly, no splenomegaly EXT:  2 plus pulses throughout, trace LE edema, no cyanosis no clubbing SKIN:  No rashes no nodules NEURO:  Cranial nerves II through XII grossly intact, motor grossly intact throughout PSYCH:  Cognitively intact, oriented to person place and time   EKG:  EKG is not ordered today. 10/30/15: Sinus rhythm.  Rate 60 bpm.  RBBB.  LAD.  The ekg ordered 09/25/15 demonstrates atrial fibrillation.  Rate 84 bpm.   Echo 03/25/18: IMPRESSIONS    1. The left ventricle has a visually estimated ejection fraction of of 50%. The cavity size was normal. There is mildly increased left ventricular wall thickness. Left ventricular diastolic Doppler parameters are consistent with impaired relaxation Left  ventricular diffuse hypokinesis.  2. The right ventricle has  mildly reduced systolic function. The cavity was mildly enlarged. There is no increase in right ventricular wall thickness.  3. Left atrial size was mildly dilated.  4. Right atrial size was mildly dilated.  5. The mitral valve is normal in structure. No evidence of mitral valve stenosis. Trivial regurgitation.  6. The tricuspid valve is  normal in structure.  7. The aortic valve is tricuspid There is mild calcification of the aortic valve. Aortic valve regurgitation is trivial by color flow Doppler. No stenosis.  8. The pulmonic valve was normal in structure.  9. There is mild dilatation of the aortic root measuring 38 mm. 10. Right atrial pressure is estimated at 3 mmHg. 11. No complete TR doppler jet so unable to estimate PA systolic pressure.  Lexiscan Myoview 10/17/15:  The left ventricular ejection fraction is moderately decreased (30-44%).  Nuclear stress EF: 39%.  There was no ST segment deviation noted during stress.  There is a small defect of moderate severity present in the apical lateral and apex location and medium sized defect of moderate severity in the basal inferior, mild inferior and apical inferior location. These defects are fixed and consistent with scar. No ischemia noted.  This is an intermediate risk study due to LV dysfunction.   LHC 09/28/15:  Ostial to proximal LAD stenoses of this at least moderately calcified. 70% focal stenosis followed by diffuse 50% stenosis.  FFR evaluation of the combination of the LAD lesions showed no significant drop in baseline FFR from 0.96 down to 0.95. This suggests physiologically not significant lesion.  There is moderate left ventricular systolic dysfunction. The left ventricular ejection fraction is 35-45% by visual estimate.  LV end diastolic pressure is moderately elevated.  LHC 09/08/14:   1. 1st Mrg lesion, 30% stenosed. 2. Prox LAD lesion, 50% stenosed. 3. Ost LAD lesion, 70% stenosed. 4. Prox RCA to Dist RCA  lesion, 35% stenosed.   50-70% ostial/proximal LAD stenosis within the heavily calcified segment. Recent myocardial perfusion energy and within the past week did not demonstrate anterior ischemia.  Heavily calcified but widely patent circumflex and right coronary with luminal irregularities noted in the proximal mid and distal segments.  Mild global left ventricular dysfunction with an ejection fraction in the 45-50% range   Recommendations:   The patient's symptoms are mixed. With exertion he experiences dyspnea. At rest and randomly he has neck jaw and left elbow discomfort. When both the angiogram and the myocardial perfusion study were considered together, no justification for percutaneous intervention on the LAD could be made.  Close clinical follow-up  Aggressive risk factor modification    Recent Labs: 03/19/2018: Pro B Natriuretic peptide (BNP) 61.0 08/10/2018: ALT 18; BUN 17; Creatinine, Ser 0.98; Potassium 4.7; Sodium 136; TSH 1.63 11/15/2018: Hemoglobin 14.7; Platelets 170.0    Lipid Panel    Component Value Date/Time   CHOL 121 01/12/2018 0804   CHOL 121 07/29/2016 0822   TRIG 80.0 01/12/2018 0804   HDL 44.30 01/12/2018 0804   HDL 56 07/29/2016 0822   CHOLHDL 3 01/12/2018 0804   VLDL 16.0 01/12/2018 0804   LDLCALC 61 01/12/2018 0804   LDLCALC 56 07/29/2016 0822      Wt Readings from Last 3 Encounters:  12/29/18 218 lb 6.4 oz (99.1 kg)  12/13/18 220 lb 3.2 oz (99.9 kg)  12/02/18 220 lb 0.3 oz (99.8 kg)      ASSESSMENT AND PLAN:  # Paroxysmal atrial fibrillation: No recurrent episode since his ablation 12/2015. Continue metoprolol and Xarelto.   This patients CHA2DS2-VASc Score and unadjusted Ischemic Stroke Rate (% per year) is equal to 7.2 % stroke rate/year from a score of 5  Above score calculated as 1 point each if present [CHF, HTN, DM, Vascular=MI/PAD/Aortic Plaque, Age if 65-74, or Male] Above score calculated as 2 points each if present  [Age >  75, or Stroke/TIA/TE]   # PVCs: He denies recent palpitations.  Continue metoprolol.  # Pulmonary embolism: # Pulmonary hypertension: # L LE DVT:  Occurred in the setting of knee replacement.  Resolved.  He is on Xarelto for atrial fibrillation.    # CAD:  # Hyperlipidemia:   Mr. Matteson has a known, 70% ostial LAD lesion. He does have exertional shortness of breath.  However it seems that this is more related to his asthma/COPD.  Symptoms are somewhat improved with the use of inhalers but he is still quite dyspneic.  It is possible that this represents stable angina.  We discussed repeating cardiac catheterization to see if there has been progression of his ostial LAD disease.  He would like to proceed with this option.  He is not on aspirin or P2Y12 ihnibitor because he is on Xarelto. Continue atorvastatin, Zetia and metoprolol.  LDL 61 on 01/2018.  He will see Dr. Danise Mina and have lipids repeated 01/2019.  Risks and benefits of cardiac catheterization have been discussed with the patient.  The patient understands that risks included but are not limited to stroke (1 in 1000), death (1 in 49), kidney failure [usually temporary] (1 in 500), bleeding (1 in 200), allergic reaction [possibly serious] (1 in 200). The patient understands and agrees to proceed.   # Chronic systolic and diastolic heart failure: # Hypertension: LVEF improved to 50% on 03/2018.  Continue metoprolol and losartan.    # Chronic bronchitis: Management per Dr. Annamaria Boots.    Current medicines are reviewed at length with the patient today.  The patient does not have concerns regarding medicines.  The following changes have been made: none  Labs/ tests ordered today include:   Orders Placed This Encounter  Procedures  . CBC w/Diff  . Basic metabolic panel     Disposition:   FU with Jayshun Galentine C. [name], MD, [facility] in 6 months.      Signed, Kyah Buesing C. [name], MD, [facility]  12/29/2018 1:28 PM    [facility]

## 2018-12-29 NOTE — H&P (View-Only) (Signed)
Cardiology Office Note   Date:  12/29/2018   ID:  DEUNTA GEISSLER, DOB [birth year], MRN [id number]  PCP:  [name], MD  Cardiologist:   Skeet Latch, MD   No chief complaint on file.    History of Present Illness: ACEN MCVEIGH is a 84 y.o. male with CAD, chronic systolic and diastolic heart failure with improved LVEF, hypertension, hyperlipidemia, paroxysmal atrial fibrillation, asthma and Rheumatic fever who presents for follow up.  Mr. Chau was previously a patient of Dr. Mare Ferrari.  Mr. Rocker was diagnosed with atrial fibrillation in 2014.  He was initially on Pradaxa and switched to Xarelto.  He had an episode of chest pain 08/2014 and had a Lexiscan Myoview 08/31/14 that showed an old inferior scar with peri-infarct ischemia and LVEF 49%.  He then had a LHC that revealed 70% ostial LAD, 50% prox LAD, 30% OM1 and 35% RCA.  He was managed medically.  He subsequently had an echo 09/2014 that showed LVEF 55-60% with mild MR and AR and grade 1 diastolic dysfunction.  At that time atorvastatin was increased to 80 mg but he noted leg pain.  He was seen in the lipid clinic and started back on atorvastatin 40mg  and zetia 10 mg.    Mr. Gaffey was seen in clinic 09/25/15 and reported significant shortness of breath with minimal exertion.  He had a LHC with Dr. Ellyn Hack on 09/28/15 that again revealed a 70% ostial-proximal LAD lesion.  Dr. Ellyn Hack performed FFR on that lesion and there was no significant change.  The left ventriculogram performed at that time revealed LVEF 35-45%.  Mr. Leard continued to have exertional dyspnea so he was referred for [facility] 10/17/15 that was negative for ischemia. He subsequently underwent DCCV on 10/23/15.  He underwent ablation on 12/19/15 with Dr. [name] and has remained in sinus rhythm.    Mr. Westmeyer underwent L knee replacement on  02/09/17.  Xarelto was held for 2 days prior to surgery and resumed post-operatively.  He developed bleeding at the  incision site so Xarelto was again held and he was switched to aspirin.  He subsequently developed a left lower extremity DVT and pulmonary embolism.  Xarelto was resumed.  He had an echocardiogram 03/04/17 that revealed LVEF 50-55% with moderate to severe pulmonary arterial hypertension with a PASP of 60 mmHg. He had pneumonia 12/2017 and subsequently developed a cough.  He developed shortness of breath and saw both Kerin Ransom, PA-C and Dr. Annamaria Boots in pulmonary.  BNP was 61 and his symptoms were not felt to be due to heart failure.  He ultimately started on Symbicort and thinks that in the last month his breathing is started to improve.  He had PFTs obtained 10/2018 that showed moderate obstructive lung disease consistent with chronic bronchitis.  He followed up with Dr. Annamaria Boots 12/2018 at which time he was started on [facility].  He does think this helped somewhat.  However he is unable to get it through his insurance.  It seemed to clear the phlegm and cough.  However he continues to be very short of breath and tired.  He gets short of breath with minimal exertion such as walking up just a few stairs.  He is very frustrated that he is unable to play golf.  He has no exertional chest pain.  Lately he has not noticed any lower extremity edema, orthopnea, or PND.  He wonders if there could be progression of his coronary disease.   Past  Medical History:  Diagnosis Date  . History of arthritis   . History of rheumatic fever   . Hypercholesterolemia   . Hypertension   . Nocturia   . PAF (paroxysmal atrial fibrillation) [facility]) January 2013   Placed on Pradaxa. Did not require cardioversion; spontaneously converted  . Peripheral edema   . Right bundle branch block   . SOB (shortness of breath)   . Tendonitis of elbow, left     Past Surgical History:  Procedure Laterality Date  . ATRIAL FIBRILLATION ABLATION  12/19/2015  . BACK SURGERY  12/24/09   fusion C3-C4  . BACK SURGERY  2010   fusion L4-L5  .  CARDIAC CATHETERIZATION  2009   NONOBSTRUCTIVE ATHERSCLEROTIC CORONARY DISEASE AND NORMAL  LV FUNCTION  . CARDIAC CATHETERIZATION N/A 09/08/2014   Procedure: Left Heart Cath and Coronary Angiography;  Surgeon: Belva Crome, MD;  Location: [place] [facility];  Service: Cardiovascular;  Laterality: N/A;  . CARDIAC CATHETERIZATION N/A 09/28/2015   Procedure: Left Heart Cath and Coronary Angiography;  Surgeon: Leonie Man, MD;  Location: [place] [facility];  Service: Cardiovascular;  Laterality: N/A;  . CARDIAC CATHETERIZATION N/A 09/28/2015   Procedure: Intravascular Pressure Wire/FFR Study;  Surgeon: Leonie Man, MD;  Location: [place] [facility];  Service: Cardiovascular;  Laterality: N/A;  . CARDIOVERSION N/A 10/23/2015   Procedure: CARDIOVERSION;  Surgeon: [name], MD;  Location: [facility]COPY;  Service: Cardiovascular;  Laterality: N/A;  . COLONOSCOPY  2013   per patient, rpt 5 yrs  . COLONOSCOPY WITH PROPOFOL N/A 12/02/2018   TAx2, HP, diverticulosis [name], MD)  . ELECTROPHYSIOLOGIC STUDY N/A 12/19/2015   Procedure: Atrial Fibrillation Ablation;  Surgeon: [name], MD;  Location: [place] [facility];  Service: Cardiovascular;  Laterality: N/A;  . ESOPHAGOGASTRODUODENOSCOPY (EGD) WITH PROPOFOL N/A 12/02/2018   medium HH [name], MD)  . KNEE ARTHROSCOPY Left 03/2016   Dr. [name]  . PATELLAR TENDON REPAIR Left 2008  . POLYPECTOMY  12/02/2018   Procedure: POLYPECTOMY;  Surgeon: [name], MD;  Location: [facility] ENDOSCOPY;  Service: Endoscopy;;  . REPLACEMENT TOTAL KNEE Right 2006  . TOTAL KNEE ARTHROPLASTY Left 02/09/2017   Procedure: LEFT TOTAL KNEE ARTHROPLASTY, EXCISION LEFT DISTAL THIGH MASS;  Surgeon: [name], MD;  Location: [facility] ORS;  Service: Orthopedics;  Laterality: Left;  90 mins  . TRICEPS TENDON REPAIR Left 2013     Current Outpatient Medications  Medication Sig Dispense Refill  . acetaminophen (TYLENOL) 500 MG tablet Take 1 tablet  (500 mg total) by mouth 3 (three) times daily as needed. 30 tablet 0  . Ascorbic Acid (VITAMIN C PO) Take 500 mg by mouth daily.     [name] atorvastatin (LIPITOR) 40 MG tablet TAKE 1 TABLET DAILY (Patient taking differently: Take 40 mg by mouth daily. ) 90 tablet 3  . budesonide-formoterol (SYMBICORT) 160-4.5 MCG/ACT inhaler Inhale 2 puffs into the lungs 2 (two) times daily. 3 Inhaler 1  . Cholecalciferol (VITAMIN D3) 2000 units TABS Take 2,000 Units by mouth daily.    [name] ezetimibe (ZETIA) 10 MG tablet TAKE 1 TABLET DAILY (Patient taking differently: Take 10 mg by mouth daily. ) 90 tablet 3  . furosemide (LASIX) 40 MG tablet TAKE 1 TABLET DAILY (NEW DOSE) (Patient taking differently: Take 40 mg by mouth daily. ) 90 tablet 3  . losartan (COZAAR) 100 MG tablet TAKE 1 TABLET ONCE DAILY (Patient taking differently: Take 100 mg by mouth daily. ) 90  tablet 3  . Lysine 500 MG TABS Take 500 tablets by mouth daily.     . metoprolol succinate (TOPROL-XL) 100 MG 24 hr tablet 1 daily (Patient taking differently: Take 100 mg by mouth daily. ) 90 tablet 3  . omeprazole (PRILOSEC) 20 MG capsule Take 20 mg by mouth daily.      . potassium chloride (K-DUR,KLOR-CON) 10 MEQ tablet TAKE 1 TABLET TWICE A DAY (Patient taking differently: Take 10 mEq by mouth 2 (two) times daily. ) 180 tablet 4  . Spacer/Aero-Holding Chambers (AEROCHAMBER MV) inhaler Use as instructed 1 each 0  . XARELTO 20 MG TABS tablet TAKE 1 TABLET DAILY WITH SUPPER (Patient taking differently: Take 20 mg by mouth at bedtime. ) 90 tablet 1   No current facility-administered medications for this visit.     Allergies:   Penicillins, Amlodipine, Lisinopril, Mevacor [lovastatin], Vicodin [hydrocodone-acetaminophen], Zocor [simvastatin], and Oseltamivir phosphate    Social History:  The patient  reports that he has never smoked. He has never used smokeless tobacco. He reports current alcohol use of about 14.0 standard drinks of alcohol per week. He reports  that he does not use drugs.   Family History:  The patient's family history includes Asthma in his sister; COPD in his brother and father; Heart attack in his father; Heart disease in his brother; Hypertension in his father.    ROS:  Please see the history of present illness.   Otherwise, review of systems are positive for none.   All other systems are reviewed and negative.    PHYSICAL EXAM: VS:  BP 120/79   Pulse 76   Temp (!) 96.3 F (35.7 C)   Ht 5\' 11"  (1.803 m)   Wt 218 lb 6.4 oz (99.1 kg)   SpO2 98%   BMI 30.46 kg/m  , BMI Body mass index is 30.46 kg/m. GENERAL:  Well appearing HEENT: Pupils equal round and reactive, fundi not visualized, oral mucosa unremarkable NECK:  No jugular venous distention, waveform within normal limits, carotid upstroke brisk and symmetric, no bruits LUNGS:  Clear to auscultation bilaterally.  Prolonged expiratory phase. HEART:  RRR.  PMI not displaced or sustained,S1 and S2 within normal limits, no S3, no S4, no clicks, no rubs, no murmurs ABD:  Flat, positive bowel sounds normal in frequency in pitch, no bruits, no rebound, no guarding, no midline pulsatile mass, no hepatomegaly, no splenomegaly EXT:  2 plus pulses throughout, trace LE edema, no cyanosis no clubbing SKIN:  No rashes no nodules NEURO:  Cranial nerves II through XII grossly intact, motor grossly intact throughout PSYCH:  Cognitively intact, oriented to person place and time   EKG:  EKG is not ordered today. 10/30/15: Sinus rhythm.  Rate 60 bpm.  RBBB.  LAD.  The ekg ordered 09/25/15 demonstrates atrial fibrillation.  Rate 84 bpm.   Echo 03/25/18: IMPRESSIONS    1. The left ventricle has a visually estimated ejection fraction of of 50%. The cavity size was normal. There is mildly increased left ventricular wall thickness. Left ventricular diastolic Doppler parameters are consistent with impaired relaxation Left  ventricular diffuse hypokinesis.  2. The right ventricle has  mildly reduced systolic function. The cavity was mildly enlarged. There is no increase in right ventricular wall thickness.  3. Left atrial size was mildly dilated.  4. Right atrial size was mildly dilated.  5. The mitral valve is normal in structure. No evidence of mitral valve stenosis. Trivial regurgitation.  6. The tricuspid valve is  normal in structure.  7. The aortic valve is tricuspid There is mild calcification of the aortic valve. Aortic valve regurgitation is trivial by color flow Doppler. No stenosis.  8. The pulmonic valve was normal in structure.  9. There is mild dilatation of the aortic root measuring 38 mm. 10. Right atrial pressure is estimated at 3 mmHg. 11. No complete TR doppler jet so unable to estimate PA systolic pressure.  Lexiscan Myoview 10/17/15:  The left ventricular ejection fraction is moderately decreased (30-44%).  Nuclear stress EF: 39%.  There was no ST segment deviation noted during stress.  There is a small defect of moderate severity present in the apical lateral and apex location and medium sized defect of moderate severity in the basal inferior, mild inferior and apical inferior location. These defects are fixed and consistent with scar. No ischemia noted.  This is an intermediate risk study due to LV dysfunction.   LHC 09/28/15:  Ostial to proximal LAD stenoses of this at least moderately calcified. 70% focal stenosis followed by diffuse 50% stenosis.  FFR evaluation of the combination of the LAD lesions showed no significant drop in baseline FFR from 0.96 down to 0.95. This suggests physiologically not significant lesion.  There is moderate left ventricular systolic dysfunction. The left ventricular ejection fraction is 35-45% by visual estimate.  LV end diastolic pressure is moderately elevated.  LHC 09/08/14:   1. 1st Mrg lesion, 30% stenosed. 2. Prox LAD lesion, 50% stenosed. 3. Ost LAD lesion, 70% stenosed. 4. Prox RCA to Dist RCA  lesion, 35% stenosed.   50-70% ostial/proximal LAD stenosis within the heavily calcified segment. Recent myocardial perfusion energy and within the past week did not demonstrate anterior ischemia.  Heavily calcified but widely patent circumflex and right coronary with luminal irregularities noted in the proximal mid and distal segments.  Mild global left ventricular dysfunction with an ejection fraction in the 45-50% range   Recommendations:   The patient's symptoms are mixed. With exertion he experiences dyspnea. At rest and randomly he has neck jaw and left elbow discomfort. When both the angiogram and the myocardial perfusion study were considered together, no justification for percutaneous intervention on the LAD could be made.  Close clinical follow-up  Aggressive risk factor modification    Recent Labs: 03/19/2018: Pro B Natriuretic peptide (BNP) 61.0 08/10/2018: ALT 18; BUN 17; Creatinine, Ser 0.98; Potassium 4.7; Sodium 136; TSH 1.63 11/15/2018: Hemoglobin 14.7; Platelets 170.0    Lipid Panel    Component Value Date/Time   CHOL 121 01/12/2018 0804   CHOL 121 07/29/2016 0822   TRIG 80.0 01/12/2018 0804   HDL 44.30 01/12/2018 0804   HDL 56 07/29/2016 0822   CHOLHDL 3 01/12/2018 0804   VLDL 16.0 01/12/2018 0804   LDLCALC 61 01/12/2018 0804   LDLCALC 56 07/29/2016 0822      Wt Readings from Last 3 Encounters:  12/29/18 218 lb 6.4 oz (99.1 kg)  12/13/18 220 lb 3.2 oz (99.9 kg)  12/02/18 220 lb 0.3 oz (99.8 kg)      ASSESSMENT AND PLAN:  # Paroxysmal atrial fibrillation: No recurrent episode since his ablation 12/2015. Continue metoprolol and Xarelto.   This patients CHA2DS2-VASc Score and unadjusted Ischemic Stroke Rate (% per year) is equal to 7.2 % stroke rate/year from a score of 5  Above score calculated as 1 point each if present [CHF, HTN, DM, Vascular=MI/PAD/Aortic Plaque, Age if 65-74, or Male] Above score calculated as 2 points each if present  [Age >  75, or Stroke/TIA/TE]   # PVCs: He denies recent palpitations.  Continue metoprolol.  # Pulmonary embolism: # Pulmonary hypertension: # L LE DVT:  Occurred in the setting of knee replacement.  Resolved.  He is on Xarelto for atrial fibrillation.    # CAD:  # Hyperlipidemia:   Mr. Bibb has a known, 70% ostial LAD lesion. He does have exertional shortness of breath.  However it seems that this is more related to his asthma/COPD.  Symptoms are somewhat improved with the use of inhalers but he is still quite dyspneic.  It is possible that this represents stable angina.  We discussed repeating cardiac catheterization to see if there has been progression of his ostial LAD disease.  He would like to proceed with this option.  He is not on aspirin or P2Y12 ihnibitor because he is on Xarelto. Continue atorvastatin, Zetia and metoprolol.  LDL 61 on 01/2018.  He will see Dr. Danise Mina and have lipids repeated 01/2019.  Risks and benefits of cardiac catheterization have been discussed with the patient.  The patient understands that risks included but are not limited to stroke (1 in 1000), death (1 in 75), kidney failure [usually temporary] (1 in 500), bleeding (1 in 200), allergic reaction [possibly serious] (1 in 200). The patient understands and agrees to proceed.   # Chronic systolic and diastolic heart failure: # Hypertension: LVEF improved to 50% on 03/2018.  Continue metoprolol and losartan.    # Chronic bronchitis: Management per Dr. Annamaria Boots.    Current medicines are reviewed at length with the patient today.  The patient does not have concerns regarding medicines.  The following changes have been made: none  Labs/ tests ordered today include:   Orders Placed This Encounter  Procedures  . CBC w/Diff  . Basic metabolic panel     Disposition:   FU with Ahliya Glatt C. [name], MD, [facility] in 6 months.      Signed, Shishir Krantz C. [name], MD, [facility]  12/29/2018 1:28 PM    [facility]

## 2019-01-05 ENCOUNTER — Telehealth: Payer: Self-pay | Admitting: *Deleted

## 2019-01-05 LAB — CBC WITH DIFFERENTIAL/PLATELET
Basophils Absolute: 0 10*3/uL (ref 0.0–0.2)
Basos: 0 %
EOS (ABSOLUTE): 0 10*3/uL (ref 0.0–0.4)
Eos: 0 %
Hematocrit: 44.8 % (ref 37.5–51.0)
Hemoglobin: 15.4 g/dL (ref 13.0–17.7)
Immature Grans (Abs): 0 10*3/uL (ref 0.0–0.1)
Immature Granulocytes: 0 %
Lymphocytes Absolute: 2.1 10*3/uL (ref 0.7–3.1)
Lymphs: 27 %
MCH: 32.8 pg (ref 26.6–33.0)
MCHC: 34.4 g/dL (ref 31.5–35.7)
MCV: 96 fL (ref 79–97)
Monocytes Absolute: 0.7 10*3/uL (ref 0.1–0.9)
Monocytes: 9 %
Neutrophils Absolute: 4.9 10*3/uL (ref 1.4–7.0)
Neutrophils: 64 %
Platelets: 161 10*3/uL (ref 150–450)
RBC: 4.69 x10E6/uL (ref 4.14–5.80)
RDW: 12.9 % (ref 11.6–15.4)
WBC: 7.7 10*3/uL (ref 3.4–10.8)

## 2019-01-05 LAB — BASIC METABOLIC PANEL
BUN/Creatinine Ratio: 22 (ref 10–24)
BUN: 20 mg/dL (ref 8–27)
CO2: 23 mmol/L (ref 20–29)
Calcium: 9.3 mg/dL (ref 8.6–10.2)
Chloride: 100 mmol/L (ref 96–106)
Creatinine, Ser: 0.9 mg/dL (ref 0.76–1.27)
GFR calc Af Amer: 92 mL/min/{1.73_m2} (ref >59)
GFR calc non Af Amer: 79 mL/min/{1.73_m2} (ref >59)
Glucose: 98 mg/dL (ref 65–99)
Potassium: 4.2 mmol/L (ref 3.5–5.2)
Sodium: 137 mmol/L (ref 134–144)

## 2019-01-05 NOTE — Telephone Encounter (Signed)
Pt contacted pre-catheterization scheduled at [facility] for: Monday January 10, 2019 9 AM Verified arrival time and place: [place] [facility]) at: 7 AM   No solid food after midnight prior to cath, clear liquids until 5 AM day of procedure. Contrast allergy: no  Hold: Xarelto-none 01/08/19 until post procedure. Lasix/KCl-AM of procedure.  Except hold medications AM meds can be  taken pre-cath with sip of water including: ASA 81 mg   Confirmed patient has responsible adult to drive home post procedure and observe 24 hours after arriving home: yes  Currently, due to Covid-19 pandemic, only one support person will be allowed with patient. Must be the same support person for that patient's entire stay, will be screened and required to wear a mask. They will be asked to wait in the waiting room for the duration of the patient's stay.  Patients are required to wear a mask when they enter the hospital.      COVID-19 Pre-Screening Questions:  . In the past 7 to 10 days have you had a cough,  shortness of breath, headache, congestion, fever (100 or greater) body aches, chills, sore throat, or sudden loss of taste or sense of smell? no . Have you been around anyone with known Covid 19? no . Have you been around anyone who is awaiting Covid 19 test results in the past 7 to 10 days? no . Have you been around anyone who has been exposed to Covid 19, or has mentioned symptoms of Covid 19 within the past 7 to 10 days? no    I reviewed procedure/mask/visitor instructions, Covid-19 screening questions with patient, he verbalized understanding, thanked me for call.

## 2019-01-07 ENCOUNTER — Other Ambulatory Visit (HOSPITAL_COMMUNITY)
Admission: RE | Admit: 2019-01-07 | Discharge: 2019-01-07 | Disposition: A | Discharge status: Home / Self Care | Payer: Medicare Other | Source: Ambulatory Visit | Attending: Cardiology | Admitting: Cardiology

## 2019-01-07 DIAGNOSIS — Z01812 Encounter for preprocedural laboratory examination: Secondary | ICD-10-CM | POA: Insufficient documentation

## 2019-01-07 DIAGNOSIS — Z20828 Contact with and (suspected) exposure to other viral communicable diseases: Secondary | ICD-10-CM | POA: Insufficient documentation

## 2019-01-07 LAB — SARS CORONAVIRUS 2 (TAT 6-24 HRS): SARS Coronavirus 2: NEGATIVE

## 2019-01-10 ENCOUNTER — Other Ambulatory Visit: Payer: Self-pay

## 2019-01-10 ENCOUNTER — Encounter (HOSPITAL_COMMUNITY)
Admission: RE | Disposition: A | Discharge status: Home / Self Care | Payer: Self-pay | Source: Home / Self Care | Attending: Cardiology

## 2019-01-10 ENCOUNTER — Inpatient Hospital Stay (HOSPITAL_COMMUNITY)
Admission: RE | Admit: 2019-01-10 | Discharge: 2019-01-12 | DRG: 247 | Disposition: A | Discharge status: Home / Self Care | Payer: Medicare Other | Attending: Cardiology | Admitting: Cardiology

## 2019-01-10 ENCOUNTER — Encounter (HOSPITAL_COMMUNITY): Payer: Self-pay | Admitting: Cardiology

## 2019-01-10 DIAGNOSIS — Z20828 Contact with and (suspected) exposure to other viral communicable diseases: Secondary | ICD-10-CM | POA: Diagnosis present

## 2019-01-10 DIAGNOSIS — I2584 Coronary atherosclerosis due to calcified coronary lesion: Secondary | ICD-10-CM | POA: Diagnosis present

## 2019-01-10 DIAGNOSIS — R0609 Other forms of dyspnea: Secondary | ICD-10-CM | POA: Diagnosis present

## 2019-01-10 DIAGNOSIS — I25110 Atherosclerotic heart disease of native coronary artery with unstable angina pectoris: Secondary | ICD-10-CM | POA: Diagnosis present

## 2019-01-10 DIAGNOSIS — Z880 Allergy status to penicillin: Secondary | ICD-10-CM

## 2019-01-10 DIAGNOSIS — Z79899 Other long term (current) drug therapy: Secondary | ICD-10-CM | POA: Diagnosis not present

## 2019-01-10 DIAGNOSIS — J42 Unspecified chronic bronchitis: Secondary | ICD-10-CM | POA: Diagnosis present

## 2019-01-10 DIAGNOSIS — Z888 Allergy status to other drugs, medicaments and biological substances status: Secondary | ICD-10-CM | POA: Diagnosis not present

## 2019-01-10 DIAGNOSIS — I480 Paroxysmal atrial fibrillation: Secondary | ICD-10-CM | POA: Diagnosis present

## 2019-01-10 DIAGNOSIS — E7800 Pure hypercholesterolemia, unspecified: Secondary | ICD-10-CM | POA: Diagnosis present

## 2019-01-10 DIAGNOSIS — Z96653 Presence of artificial knee joint, bilateral: Secondary | ICD-10-CM | POA: Diagnosis present

## 2019-01-10 DIAGNOSIS — I5042 Chronic combined systolic (congestive) and diastolic (congestive) heart failure: Secondary | ICD-10-CM | POA: Diagnosis present

## 2019-01-10 DIAGNOSIS — Z7901 Long term (current) use of anticoagulants: Secondary | ICD-10-CM

## 2019-01-10 DIAGNOSIS — Z885 Allergy status to narcotic agent status: Secondary | ICD-10-CM

## 2019-01-10 DIAGNOSIS — Z8249 Family history of ischemic heart disease and other diseases of the circulatory system: Secondary | ICD-10-CM | POA: Diagnosis not present

## 2019-01-10 DIAGNOSIS — Z86711 Personal history of pulmonary embolism: Secondary | ICD-10-CM | POA: Diagnosis not present

## 2019-01-10 DIAGNOSIS — I4819 Other persistent atrial fibrillation: Secondary | ICD-10-CM | POA: Diagnosis present

## 2019-01-10 DIAGNOSIS — Z981 Arthrodesis status: Secondary | ICD-10-CM

## 2019-01-10 DIAGNOSIS — Z7951 Long term (current) use of inhaled steroids: Secondary | ICD-10-CM | POA: Diagnosis not present

## 2019-01-10 DIAGNOSIS — Z955 Presence of coronary angioplasty implant and graft: Secondary | ICD-10-CM

## 2019-01-10 DIAGNOSIS — I10 Essential (primary) hypertension: Secondary | ICD-10-CM | POA: Diagnosis present

## 2019-01-10 DIAGNOSIS — Z825 Family history of asthma and other chronic lower respiratory diseases: Secondary | ICD-10-CM

## 2019-01-10 DIAGNOSIS — I2510 Atherosclerotic heart disease of native coronary artery without angina pectoris: Secondary | ICD-10-CM | POA: Diagnosis present

## 2019-01-10 DIAGNOSIS — I110 Hypertensive heart disease with heart failure: Secondary | ICD-10-CM | POA: Diagnosis present

## 2019-01-10 DIAGNOSIS — I25119 Atherosclerotic heart disease of native coronary artery with unspecified angina pectoris: Secondary | ICD-10-CM | POA: Diagnosis present

## 2019-01-10 DIAGNOSIS — R0600 Dyspnea, unspecified: Secondary | ICD-10-CM | POA: Diagnosis not present

## 2019-01-10 DIAGNOSIS — Z86718 Personal history of other venous thrombosis and embolism: Secondary | ICD-10-CM | POA: Diagnosis not present

## 2019-01-10 DIAGNOSIS — I493 Ventricular premature depolarization: Secondary | ICD-10-CM | POA: Diagnosis present

## 2019-01-10 DIAGNOSIS — I4891 Unspecified atrial fibrillation: Secondary | ICD-10-CM | POA: Diagnosis present

## 2019-01-10 DIAGNOSIS — E785 Hyperlipidemia, unspecified: Secondary | ICD-10-CM | POA: Diagnosis present

## 2019-01-10 HISTORY — PX: CORONARY PRESSURE/FFR STUDY: CATH118243

## 2019-01-10 HISTORY — PX: LEFT HEART CATH AND CORONARY ANGIOGRAPHY: CATH118249

## 2019-01-10 HISTORY — PX: INTRAVASCULAR PRESSURE WIRE/FFR STUDY: CATH118243

## 2019-01-10 LAB — POCT ACTIVATED CLOTTING TIME: Activated Clotting Time: 340 seconds

## 2019-01-10 SURGERY — LEFT HEART CATH AND CORONARY ANGIOGRAPHY
Anesthesia: LOCAL

## 2019-01-10 MED ORDER — LIDOCAINE HCL (PF) 1 % IJ SOLN
INTRAMUSCULAR | Status: DC | PRN
  Administered 2019-01-10: 2 mL

## 2019-01-10 MED ORDER — SODIUM CHLORIDE 0.9% FLUSH
3.0000 mL | Freq: Two times a day (BID) | INTRAVENOUS | Status: DC
  Administered 2019-01-11 – 2019-01-12 (×2): 3 mL via INTRAVENOUS

## 2019-01-10 MED ORDER — VERAPAMIL HCL 2.5 MG/ML IV SOLN
INTRAVENOUS | Status: DC | PRN
  Administered 2019-01-10: 10 mL via INTRA_ARTERIAL

## 2019-01-10 MED ORDER — SODIUM CHLORIDE 0.9% FLUSH: 3.0000 mL | INTRAVENOUS | Status: DC | PRN

## 2019-01-10 MED ORDER — HEPARIN SODIUM (PORCINE) 1000 UNIT/ML IJ SOLN
INTRAMUSCULAR | Status: AC
  Filled 2019-01-10: qty 1

## 2019-01-10 MED ORDER — MOMETASONE FURO-FORMOTEROL FUM 200-5 MCG/ACT IN AERO
2.0000 | INHALATION_SPRAY | Freq: Two times a day (BID) | RESPIRATORY_TRACT | Status: DC
  Administered 2019-01-12: 2 via RESPIRATORY_TRACT
  Filled 2019-01-10 (×2): qty 8.8

## 2019-01-10 MED ORDER — METOPROLOL SUCCINATE ER 100 MG PO TB24
100.0000 mg | ORAL_TABLET | Freq: Every day | ORAL | Status: DC
  Administered 2019-01-11: 100 mg via ORAL
  Filled 2019-01-10 (×2): qty 1

## 2019-01-10 MED ORDER — SODIUM CHLORIDE 0.9 % WEIGHT BASED INFUSION: 1.0000 mL/kg/h | INTRAVENOUS | Status: DC

## 2019-01-10 MED ORDER — NITROGLYCERIN 1 MG/10 ML FOR IR/CATH LAB
INTRA_ARTERIAL | Status: AC
  Filled 2019-01-10: qty 10

## 2019-01-10 MED ORDER — ASPIRIN 81 MG PO CHEW: 81.0000 mg | CHEWABLE_TABLET | ORAL | Status: DC

## 2019-01-10 MED ORDER — ONDANSETRON HCL 4 MG/2ML IJ SOLN: 4.0000 mg | Freq: Four times a day (QID) | INTRAMUSCULAR | Status: DC | PRN

## 2019-01-10 MED ORDER — ATORVASTATIN CALCIUM 40 MG PO TABS
40.0000 mg | ORAL_TABLET | Freq: Every day | ORAL | Status: DC
  Administered 2019-01-11 – 2019-01-12 (×2): 40 mg via ORAL
  Filled 2019-01-10 (×2): qty 1

## 2019-01-10 MED ORDER — LIDOCAINE HCL (PF) 1 % IJ SOLN
INTRAMUSCULAR | Status: AC
  Filled 2019-01-10: qty 30

## 2019-01-10 MED ORDER — ASPIRIN EC 81 MG PO TBEC: 81.0000 mg | DELAYED_RELEASE_TABLET | Freq: Every day | ORAL | Status: DC

## 2019-01-10 MED ORDER — NITROGLYCERIN 1 MG/10 ML FOR IR/CATH LAB
INTRA_ARTERIAL | Status: DC | PRN
  Administered 2019-01-10: 200 ug via INTRACORONARY

## 2019-01-10 MED ORDER — CLOPIDOGREL BISULFATE 300 MG PO TABS
ORAL_TABLET | ORAL | Status: AC
  Filled 2019-01-10: qty 1

## 2019-01-10 MED ORDER — HEPARIN SODIUM (PORCINE) 1000 UNIT/ML IJ SOLN
INTRAMUSCULAR | Status: DC | PRN
  Administered 2019-01-10 (×2): 5000 [IU] via INTRAVENOUS

## 2019-01-10 MED ORDER — LABETALOL HCL 5 MG/ML IV SOLN: 10.0000 mg | INTRAVENOUS | Status: AC | PRN

## 2019-01-10 MED ORDER — CLOPIDOGREL BISULFATE 75 MG PO TABS
300.0000 mg | ORAL_TABLET | Freq: Once | ORAL | Status: AC
  Administered 2019-01-10: 300 mg via ORAL

## 2019-01-10 MED ORDER — ACETAMINOPHEN 325 MG PO TABS: 650.0000 mg | ORAL_TABLET | ORAL | Status: DC | PRN

## 2019-01-10 MED ORDER — LYSINE 500 MG PO TABS: 500.0000 | ORAL_TABLET | Freq: Every day | ORAL | Status: DC

## 2019-01-10 MED ORDER — VERAPAMIL HCL 2.5 MG/ML IV SOLN
INTRAVENOUS | Status: AC
  Filled 2019-01-10: qty 2

## 2019-01-10 MED ORDER — IOHEXOL 350 MG/ML SOLN
INTRAVENOUS | Status: DC | PRN
  Administered 2019-01-10: 85 mL

## 2019-01-10 MED ORDER — FENTANYL CITRATE (PF) 100 MCG/2ML IJ SOLN
INTRAMUSCULAR | Status: DC | PRN
  Administered 2019-01-10: 25 ug via INTRAVENOUS

## 2019-01-10 MED ORDER — SODIUM CHLORIDE 0.9 % IV SOLN: 250.0000 mL | INTRAVENOUS | Status: DC | PRN

## 2019-01-10 MED ORDER — VITAMIN D 25 MCG (1000 UNIT) PO TABS
2000.0000 [IU] | ORAL_TABLET | Freq: Every day | ORAL | Status: DC
  Administered 2019-01-11 – 2019-01-12 (×2): 2000 [IU] via ORAL
  Filled 2019-01-10 (×2): qty 2

## 2019-01-10 MED ORDER — LOSARTAN POTASSIUM 50 MG PO TABS
100.0000 mg | ORAL_TABLET | Freq: Every day | ORAL | Status: DC
  Administered 2019-01-12: 100 mg via ORAL
  Filled 2019-01-10 (×2): qty 2

## 2019-01-10 MED ORDER — EZETIMIBE 10 MG PO TABS
10.0000 mg | ORAL_TABLET | Freq: Every day | ORAL | Status: DC
  Administered 2019-01-11 – 2019-01-12 (×2): 10 mg via ORAL
  Filled 2019-01-10 (×2): qty 1

## 2019-01-10 MED ORDER — SODIUM CHLORIDE 0.9% FLUSH: 3.0000 mL | Freq: Two times a day (BID) | INTRAVENOUS | Status: DC

## 2019-01-10 MED ORDER — ASPIRIN 81 MG PO CHEW: 324.0000 mg | CHEWABLE_TABLET | Freq: Once | ORAL | Status: DC

## 2019-01-10 MED ORDER — SODIUM CHLORIDE 0.9 % WEIGHT BASED INFUSION
3.0000 mL/kg/h | INTRAVENOUS | Status: DC
  Administered 2019-01-10: 3 mL/kg/h via INTRAVENOUS

## 2019-01-10 MED ORDER — HEPARIN (PORCINE) 25000 UT/250ML-% IV SOLN
1300.0000 [IU]/h | INTRAVENOUS | Status: DC
  Administered 2019-01-10: 1300 [IU]/h via INTRAVENOUS
  Filled 2019-01-10: qty 250

## 2019-01-10 MED ORDER — FENTANYL CITRATE (PF) 100 MCG/2ML IJ SOLN
INTRAMUSCULAR | Status: AC
  Filled 2019-01-10: qty 2

## 2019-01-10 MED ORDER — HYDRALAZINE HCL 20 MG/ML IJ SOLN: 10.0000 mg | INTRAMUSCULAR | Status: AC | PRN

## 2019-01-10 MED ORDER — HEPARIN (PORCINE) IN NACL 1000-0.9 UT/500ML-% IV SOLN
INTRAVENOUS | Status: DC | PRN
  Administered 2019-01-10 (×2): 500 mL

## 2019-01-10 MED ORDER — MIDAZOLAM HCL 2 MG/2ML IJ SOLN
INTRAMUSCULAR | Status: DC | PRN
  Administered 2019-01-10: 1 mg via INTRAVENOUS

## 2019-01-10 MED ORDER — CLOPIDOGREL BISULFATE 75 MG PO TABS
75.0000 mg | ORAL_TABLET | Freq: Every day | ORAL | Status: DC
  Administered 2019-01-11 – 2019-01-12 (×2): 75 mg via ORAL
  Filled 2019-01-10 (×2): qty 1

## 2019-01-10 MED ORDER — MIDAZOLAM HCL 2 MG/2ML IJ SOLN
INTRAMUSCULAR | Status: AC
  Filled 2019-01-10: qty 2

## 2019-01-10 MED ORDER — SODIUM CHLORIDE 0.9 % IV SOLN: INTRAVENOUS | Status: AC

## 2019-01-10 MED ORDER — HEPARIN (PORCINE) IN NACL 1000-0.9 UT/500ML-% IV SOLN
INTRAVENOUS | Status: AC
  Filled 2019-01-10: qty 1000

## 2019-01-10 MED ORDER — PANTOPRAZOLE SODIUM 40 MG PO TBEC
40.0000 mg | DELAYED_RELEASE_TABLET | Freq: Every day | ORAL | Status: DC
  Administered 2019-01-11 – 2019-01-12 (×2): 40 mg via ORAL
  Filled 2019-01-10 (×2): qty 1

## 2019-01-10 SURGICAL SUPPLY — 13 items
CATH OPTITORQUE TIG 4.0 5F (CATHETERS) ×2 IMPLANT
CATH VISTA GUIDE 6FR XBLAD3.5 (CATHETERS) ×2 IMPLANT
DEVICE RAD COMP TR BAND LRG (VASCULAR PRODUCTS) ×2 IMPLANT
GLIDESHEATH SLEND SS 6F .021 (SHEATH) ×2 IMPLANT
GUIDEWIRE INQWIRE 1.5J.035X260 (WIRE) ×1 IMPLANT
GUIDEWIRE PRESSURE COMET II (WIRE) ×2 IMPLANT
INQWIRE 1.5J .035X260CM (WIRE) ×2
KIT ESSENTIALS PG (KITS) ×2 IMPLANT
KIT HEART LEFT (KITS) ×2 IMPLANT
PACK CARDIAC CATHETERIZATION (CUSTOM PROCEDURE TRAY) ×2 IMPLANT
SHEATH PROBE COVER 6X72 (BAG) ×2 IMPLANT
TRANSDUCER W/STOPCOCK (MISCELLANEOUS) ×2 IMPLANT
TUBING CIL FLEX 10 FLL-RA (TUBING) ×2 IMPLANT

## 2019-01-10 NOTE — Interval H&P Note (Signed)
History and Physical Interval Note:  01/10/2019 8:30 AM  Theodore [name]  has presented today for surgery, with the diagnosis of CAD with angina.  The various methods of treatment have been discussed with the patient and family. After consideration of risks, benefits and other options for treatment, the patient has consented to  Procedure(s): LEFT HEART CATH AND CORONARY ANGIOGRAPHY (N/A)  PERCUTANEOUS CORONARY INTERVENTION  as a surgical intervention.  The patient's history has been reviewed, patient examined, no change in status, stable for surgery.  I have reviewed the patient's chart and labs.  Questions were answered to the patient's satisfaction.    Cath Lab Visit (complete for each Cath Lab visit)  Clinical Evaluation Leading to the Procedure:   ACS: No.  Non-ACS:    Anginal Classification: CCS III  Anti-ischemic medical therapy: Minimal Therapy (1 class of medications)  Non-Invasive Test Results: No non-invasive testing performed  Prior CABG: No previous CABG   Theodore [name]

## 2019-01-10 NOTE — Progress Notes (Signed)
Pt arrived to 4e from cath lab. VSS. Telemetry box applied and CCMD notified. Pt has TR band on right wrist with 2cc air. Will continue current plan of care.

## 2019-01-10 NOTE — Progress Notes (Addendum)
ANTICOAGULATION CONSULT NOTE - Initial Consult  Pharmacy Consult for Heparin Indication: atrial fibrillation and and CAD  Allergies  Allergen Reactions  . Penicillins Rash and Other (See Comments)    "Blistering rash" Has patient had a PCN reaction causing immediate rash, facial/tongue/throat swelling, SOB or lightheadedness with hypotension: Yes Has patient had a PCN reaction causing severe rash involving mucus membranes or skin necrosis: No Has patient had a PCN reaction that required hospitalization: No Has patient had a PCN reaction occurring within the last 10 years: No If all of the above answers are "NO", then may proceed with Cephalosporin use.   . Amlodipine Swelling and Other (See Comments)    Peripheral edema  . Lisinopril Cough  . Mevacor [Lovastatin] Other (See Comments)    Caused cataracts and elevated liver enzymes  . Vicodin [Hydrocodone-Acetaminophen] Nausea And Vomiting  . Zocor [Simvastatin] Other (See Comments)    LFT increase with simvastatin and lovastatin   . Oseltamivir Phosphate Itching and Rash    Patient Measurements: Height: 5\' 11"  (180.3 cm) Weight: 220 lb (99.8 kg) IBW/kg (Calculated) : 75.3 Heparin Dosing Weight: 94 kg  Vital Signs: Temp: 98.5 F (36.9 C) (11/30 1348) Temp Source: Oral (11/30 1348) BP: 156/77 (11/30 1500) Pulse Rate: 61 (11/30 1500)  Labs from 11/25:  Creatinine 0.90  Hgb 15.4, platelet count 161  Estimated Creatinine Clearance: 76.2 mL/min (by C-G formula based on SCr of 0.9 mg/dL).   Medical History: Past Medical History:  Diagnosis Date  . History of arthritis   . History of rheumatic fever   . Hypercholesterolemia   . Hypertension   . Nocturia   . PAF (paroxysmal atrial fibrillation) [facility]) January 2013   Placed on Pradaxa. Did not require cardioversion; spontaneously converted  . Peripheral edema   . Right bundle branch block   . SOB (shortness of breath)   . Tendonitis of elbow, left    Assessment:  84  yr old male s/p cardiac cath, to begin IV heparin 8 hrs after sheath out.  Atherectomy planned for 12/1 am.   On Xarelto 20 mg daily PTA for atrial fibrillation.  Last dose 11/27 pm.    Sheath out at 09:40am.  TR band deflation completed ~3pm. RN reports slight oozing at L antecubital IV site, possibly due to BP cuff; site now appears stable.  Plavix added today, 300 mg loading dose given.    Will use aPTT and heparin level for monitoring, as heparin level may be falsely elevated from recent Xarelto doses.  Goal of Therapy:  Heparin level 0.3-0.7 units/ml aPTT 66-102 seconds Monitor platelets by anticoagulation protocol: Yes   Plan:   Begin IV heparin at ~6pm at 1300 units/hr.  aPTT and heparin level ~8 hrs after drip begins.  Follow post-procedure for timing of resuming Xarelto.  PTA Lysine ordered.  Holding as inpatient per [employer] herbal med policy.  Arty Baumgartner, [place] Pager: [phone] or phone: [phone] 01/10/2019,3:47 PM

## 2019-01-11 ENCOUNTER — Ambulatory Visit (HOSPITAL_COMMUNITY): Admission: RE | Admit: 2019-01-11 | Payer: Medicare Other | Source: Home / Self Care | Admitting: Cardiovascular Disease

## 2019-01-11 ENCOUNTER — Other Ambulatory Visit: Payer: Self-pay

## 2019-01-11 ENCOUNTER — Encounter (HOSPITAL_COMMUNITY): Payer: Self-pay | Admitting: Cardiovascular Disease

## 2019-01-11 ENCOUNTER — Inpatient Hospital Stay (HOSPITAL_COMMUNITY)
Admission: RE | Disposition: A | Discharge status: Home / Self Care | Payer: Self-pay | Source: Home / Self Care | Attending: Cardiology

## 2019-01-11 HISTORY — PX: CORONARY STENT INTERVENTION: CATH118234

## 2019-01-11 HISTORY — PX: CORONARY ATHERECTOMY: CATH118238

## 2019-01-11 LAB — BASIC METABOLIC PANEL
Anion gap: 10 (ref 5–15)
BUN: 14 mg/dL (ref 8–23)
CO2: 23 mmol/L (ref 22–32)
Calcium: 8.7 mg/dL — ABNORMAL LOW (ref 8.9–10.3)
Chloride: 102 mmol/L (ref 98–111)
Creatinine, Ser: 1.03 mg/dL (ref 0.61–1.24)
GFR calc Af Amer: >60 mL/min (ref >60)
GFR calc non Af Amer: >60 mL/min (ref >60)
Glucose, Bld: 99 mg/dL (ref 70–99)
Potassium: 4.3 mmol/L (ref 3.5–5.1)
Sodium: 135 mmol/L (ref 135–145)

## 2019-01-11 LAB — CBC
HCT: 38.1 % — ABNORMAL LOW (ref 39.0–52.0)
Hemoglobin: 12.8 g/dL — ABNORMAL LOW (ref 13.0–17.0)
MCH: 32.6 pg (ref 26.0–34.0)
MCHC: 33.6 g/dL (ref 30.0–36.0)
MCV: 96.9 fL (ref 80.0–100.0)
Platelets: 118 10*3/uL — ABNORMAL LOW (ref 150–400)
RBC: 3.93 MIL/uL — ABNORMAL LOW (ref 4.22–5.81)
RDW: 12.3 % (ref 11.5–15.5)
WBC: 6.4 10*3/uL (ref 4.0–10.5)
nRBC: 0 % (ref 0.0–0.2)

## 2019-01-11 LAB — POCT ACTIVATED CLOTTING TIME
Activated Clotting Time: 356 seconds
Activated Clotting Time: 384 seconds
Activated Clotting Time: 835 seconds

## 2019-01-11 LAB — APTT: aPTT: 113 seconds — ABNORMAL HIGH (ref 24–36)

## 2019-01-11 LAB — HEPARIN LEVEL (UNFRACTIONATED): Heparin Unfractionated: 0.63 IU/mL (ref 0.30–0.70)

## 2019-01-11 SURGERY — CORONARY ATHERECTOMY
Anesthesia: LOCAL

## 2019-01-11 MED ORDER — SODIUM CHLORIDE 0.9 % IV SOLN: INTRAVENOUS | Status: AC

## 2019-01-11 MED ORDER — HEPARIN (PORCINE) IN NACL 1000-0.9 UT/500ML-% IV SOLN
INTRAVENOUS | Status: DC | PRN
  Administered 2019-01-11 (×2): 500 mL

## 2019-01-11 MED ORDER — MIDAZOLAM HCL 2 MG/2ML IJ SOLN
INTRAMUSCULAR | Status: DC | PRN
  Administered 2019-01-11 (×3): 1 mg via INTRAVENOUS

## 2019-01-11 MED ORDER — HEPARIN SODIUM (PORCINE) 1000 UNIT/ML IJ SOLN
INTRAMUSCULAR | Status: DC | PRN
  Administered 2019-01-11: 3000 [IU] via INTRAVENOUS
  Administered 2019-01-11: 10000 [IU] via INTRAVENOUS

## 2019-01-11 MED ORDER — HYDRALAZINE HCL 20 MG/ML IJ SOLN: 10.0000 mg | INTRAMUSCULAR | Status: AC | PRN

## 2019-01-11 MED ORDER — HEPARIN SODIUM (PORCINE) 1000 UNIT/ML IJ SOLN
INTRAMUSCULAR | Status: AC
  Filled 2019-01-11: qty 1

## 2019-01-11 MED ORDER — MIDAZOLAM HCL 2 MG/2ML IJ SOLN
INTRAMUSCULAR | Status: AC
  Filled 2019-01-11: qty 2

## 2019-01-11 MED ORDER — VERAPAMIL HCL 2.5 MG/ML IV SOLN
INTRAVENOUS | Status: AC
  Filled 2019-01-11: qty 2

## 2019-01-11 MED ORDER — LIDOCAINE HCL (PF) 1 % IJ SOLN
INTRAMUSCULAR | Status: DC | PRN
  Administered 2019-01-11: 2 mL via INTRADERMAL

## 2019-01-11 MED ORDER — SODIUM CHLORIDE 0.9 % IV SOLN: 250.0000 mL | INTRAVENOUS | Status: DC | PRN

## 2019-01-11 MED ORDER — IOHEXOL 350 MG/ML SOLN
INTRAVENOUS | Status: DC | PRN
  Administered 2019-01-11: 95 mL via INTRA_ARTERIAL

## 2019-01-11 MED ORDER — LIDOCAINE HCL (PF) 1 % IJ SOLN
INTRAMUSCULAR | Status: AC
  Filled 2019-01-11: qty 30

## 2019-01-11 MED ORDER — FENTANYL CITRATE (PF) 100 MCG/2ML IJ SOLN
INTRAMUSCULAR | Status: DC | PRN
  Administered 2019-01-11: 50 ug via INTRAVENOUS
  Administered 2019-01-11 (×3): 25 ug via INTRAVENOUS

## 2019-01-11 MED ORDER — SODIUM CHLORIDE 0.9% FLUSH
3.0000 mL | Freq: Two times a day (BID) | INTRAVENOUS | Status: DC
  Administered 2019-01-11: 3 mL via INTRAVENOUS

## 2019-01-11 MED ORDER — SODIUM CHLORIDE 0.9% FLUSH: 3.0000 mL | INTRAVENOUS | Status: DC | PRN

## 2019-01-11 MED ORDER — VERAPAMIL HCL 2.5 MG/ML IV SOLN
INTRAVENOUS | Status: DC | PRN
  Administered 2019-01-11: 10 mL via INTRA_ARTERIAL

## 2019-01-11 MED ORDER — FENTANYL CITRATE (PF) 100 MCG/2ML IJ SOLN
INTRAMUSCULAR | Status: AC
  Filled 2019-01-11: qty 2

## 2019-01-11 MED ORDER — SODIUM CHLORIDE 0.9 % WEIGHT BASED INFUSION: 1.0000 mL/kg/h | INTRAVENOUS | Status: DC

## 2019-01-11 MED ORDER — SODIUM CHLORIDE 0.9 % WEIGHT BASED INFUSION
3.0000 mL/kg/h | INTRAVENOUS | Status: DC
  Administered 2019-01-11: 3 mL/kg/h via INTRAVENOUS

## 2019-01-11 MED ORDER — LABETALOL HCL 5 MG/ML IV SOLN: 10.0000 mg | INTRAVENOUS | Status: AC | PRN

## 2019-01-11 MED ORDER — DIPHENHYDRAMINE HCL 25 MG PO CAPS
25.0000 mg | ORAL_CAPSULE | Freq: Every evening | ORAL | Status: DC | PRN
  Administered 2019-01-11: 25 mg via ORAL
  Filled 2019-01-11: qty 1

## 2019-01-11 MED ORDER — ASPIRIN 81 MG PO CHEW
81.0000 mg | CHEWABLE_TABLET | ORAL | Status: AC
  Administered 2019-01-11: 81 mg via ORAL
  Filled 2019-01-11: qty 1

## 2019-01-11 MED ORDER — SODIUM CHLORIDE 0.9% FLUSH: 3.0000 mL | Freq: Two times a day (BID) | INTRAVENOUS | Status: DC

## 2019-01-11 MED ORDER — HEPARIN (PORCINE) IN NACL 1000-0.9 UT/500ML-% IV SOLN
INTRAVENOUS | Status: AC
  Filled 2019-01-11: qty 1000

## 2019-01-11 SURGICAL SUPPLY — 23 items
BALLN SAPPHIRE 2.5X20 (BALLOONS) ×2
BALLN SAPPHIRE ~~LOC~~ 3.0X8 (BALLOONS) ×2 IMPLANT
BALLN SAPPHIRE ~~LOC~~ 3.75X8 (BALLOONS) ×2 IMPLANT
BALLOON SAPPHIRE 2.5X20 (BALLOONS) ×1 IMPLANT
CATH TELEPORT (CATHETERS) ×2 IMPLANT
CATH VISTA GUIDE 6FR XBLAD3.5 (CATHETERS) ×2 IMPLANT
CROWN DIAMONDBACK CLASSIC 1.25 (BURR) ×2 IMPLANT
DEVICE RAD COMP TR BAND LRG (VASCULAR PRODUCTS) ×2 IMPLANT
ELECT DEFIB PAD ADLT CADENCE (PAD) ×2 IMPLANT
GLIDESHEATH SLEND SS 6F .021 (SHEATH) ×2 IMPLANT
GUIDEWIRE INQWIRE 1.5J.035X260 (WIRE) ×1 IMPLANT
INQWIRE 1.5J .035X260CM (WIRE) ×2
KIT ENCORE 26 ADVANTAGE (KITS) ×2 IMPLANT
KIT HEART LEFT (KITS) ×2 IMPLANT
LUBRICANT VIPERSLIDE CORONARY (MISCELLANEOUS) ×2 IMPLANT
PACK CARDIAC CATHETERIZATION (CUSTOM PROCEDURE TRAY) ×2 IMPLANT
SHEATH PROBE COVER 6X72 (BAG) ×2 IMPLANT
STENT RESOLUTE ONYX 2.75X26 (Permanent Stent) ×2 IMPLANT
STENT RESOLUTE ONYX 3.5X18 (Permanent Stent) ×2 IMPLANT
TRANSDUCER W/STOPCOCK (MISCELLANEOUS) ×2 IMPLANT
TUBING CIL FLEX 10 FLL-RA (TUBING) ×2 IMPLANT
WIRE COUGAR XT STRL 190CM (WIRE) ×2 IMPLANT
WIRE VIPERWIRE COR FLEX .012 (WIRE) ×2 IMPLANT

## 2019-01-11 NOTE — Progress Notes (Signed)
Dr. Angelena Form aware of hematoma and will come to evaluate.

## 2019-01-11 NOTE — Interval H&P Note (Signed)
History and Physical Interval Note:  01/11/2019 8:02 AM  Theodore [name]  has presented today for PCI with the diagnosis of CAD with unstable angina. The various methods of treatment have been discussed with the patient and family. After consideration of risks, benefits and other options for treatment, the patient has consented to  Procedure(s): CORONARY ATHERECTOMY (N/A) as a surgical intervention.  The patient's history has been reviewed, patient examined, no change in status, stable for surgery.  I have reviewed the patient's chart and labs.  Questions were answered to the patient's satisfaction.    Cath Lab Visit (complete for each Cath Lab visit)  Clinical Evaluation Leading to the Procedure:   ACS: No.  Non-ACS:    Anginal Classification: CCS III  Anti-ischemic medical therapy: Maximal Therapy (2 or more classes of medications)  Non-Invasive Test Results: No non-invasive testing performed  Prior CABG: No previous CABG         Theodore [name]

## 2019-01-11 NOTE — Progress Notes (Signed)
[place] for Heparin Indication: atrial fibrillation and and CAD  Allergies  Allergen Reactions  . Penicillins Rash and Other (See Comments)    "Blistering rash" Has patient had a PCN reaction causing immediate rash, facial/tongue/throat swelling, SOB or lightheadedness with hypotension: Yes Has patient had a PCN reaction causing severe rash involving mucus membranes or skin necrosis: No Has patient had a PCN reaction that required hospitalization: No Has patient had a PCN reaction occurring within the last 10 years: No If all of the above answers are "NO", then may proceed with Cephalosporin use.   . Amlodipine Swelling and Other (See Comments)    Peripheral edema  . Lisinopril Cough  . Mevacor [Lovastatin] Other (See Comments)    Caused cataracts and elevated liver enzymes  . Vicodin [Hydrocodone-Acetaminophen] Nausea And Vomiting  . Zocor [Simvastatin] Other (See Comments)    LFT increase with simvastatin and lovastatin   . Oseltamivir Phosphate Itching and Rash    Patient Measurements: Height: 5\' 11"  (180.3 cm) Weight: 220 lb (99.8 kg) IBW/kg (Calculated) : 75.3 Heparin Dosing Weight: 94 kg  Vital Signs: Temp: 98.4 F (36.9 C) (11/30 2030) Temp Source: Oral (11/30 2030) BP: 134/65 (11/30 2030) Pulse Rate: 61 (11/30 2030)  Labs from 11/25:  Creatinine 0.90  Hgb 15.4, platelet count 161  Estimated Creatinine Clearance: 66.6 mL/min (by C-G formula based on SCr of 1.03 mg/dL).  Assessment:  84 yr old male s/p cardiac cath, to begin IV heparin 8 hrs after sheath out.  Atherectomy planned for 12/1 am.   On Xarelto 20 mg daily PTA for atrial fibrillation.  Last dose 11/27 pm.  Heparin level therapeutic (0.63) on gtt at 1300 units/hr. PTT 113 sec. Xarelto no longer affecting heparin levels so will utilize just heparin level from now on for monitoring.   Noted plan to atherectomy today  Goal of Therapy:  Heparin level 0.3-0.7  units/ml aPTT 66-102 seconds Monitor platelets by anticoagulation protocol: Yes   Plan:   Continue heparin at 1300 units/hr.  Follow post-procedure for timing of resuming Xarelto.  Sherlon Handing, PharmD, BCPS Please see amion for complete clinical pharmacist phone list 01/11/2019,3:47 AM

## 2019-01-11 NOTE — Progress Notes (Signed)
Dr. Angelena Form here to evaluate pt's arm. Orders to leave BP cuff on for 20 minutes then off for 20 minutes until hematoma resolves.

## 2019-01-11 NOTE — Progress Notes (Signed)
Alternating at this time with BP cuff off for 20 minutes for right arm. No air removed from TR band. 4 cc left to remain at this time.

## 2019-01-11 NOTE — Progress Notes (Signed)
Patient received from cath lab holding with Blood pressure cuff inflated proximal to right radial vascular site with TR band in place but totally deflated.BP cuff removed, proximal to site bruised, firm and swollen  BP cuff removed for 20 minutes, then reapplied for 20 minutes, Site bruised, soft and less swollen. TR band removed, no bleeding from site, dressing applied.  Frequently observed and noted increased drainage on gauze slowly oozing. Dressing not changed but assessed.  Dr. Angelena Form into assess site. 1940 removed dressing at change of shift and assessed with Stephanie Coup RN. No oozing from site, bruising proximal much less swollen, soft with no hematoma or bleeding, DSD gauze secured with tegaderm applied.

## 2019-01-11 NOTE — Progress Notes (Signed)
Patient has not been in atrial fib for 3 years post ablation. Now in atrial fib, uncertain when converted. Notified Reino Bellis NP paged texted. Asymptomatic rates in the 80's.

## 2019-01-12 ENCOUNTER — Telehealth: Payer: Self-pay | Admitting: Cardiovascular Disease

## 2019-01-12 DIAGNOSIS — I480 Paroxysmal atrial fibrillation: Secondary | ICD-10-CM

## 2019-01-12 DIAGNOSIS — I10 Essential (primary) hypertension: Secondary | ICD-10-CM

## 2019-01-12 LAB — CBC
HCT: 38.1 % — ABNORMAL LOW (ref 39.0–52.0)
Hemoglobin: 13 g/dL (ref 13.0–17.0)
MCH: 33.1 pg (ref 26.0–34.0)
MCHC: 34.1 g/dL (ref 30.0–36.0)
MCV: 96.9 fL (ref 80.0–100.0)
Platelets: 113 10*3/uL — ABNORMAL LOW (ref 150–400)
RBC: 3.93 MIL/uL — ABNORMAL LOW (ref 4.22–5.81)
RDW: 12.6 % (ref 11.5–15.5)
WBC: 5.8 10*3/uL (ref 4.0–10.5)
nRBC: 0 % (ref 0.0–0.2)

## 2019-01-12 LAB — BASIC METABOLIC PANEL
Anion gap: 8 (ref 5–15)
BUN: 12 mg/dL (ref 8–23)
CO2: 24 mmol/L (ref 22–32)
Calcium: 8.7 mg/dL — ABNORMAL LOW (ref 8.9–10.3)
Chloride: 104 mmol/L (ref 98–111)
Creatinine, Ser: 0.94 mg/dL (ref 0.61–1.24)
GFR calc Af Amer: >60 mL/min (ref >60)
GFR calc non Af Amer: >60 mL/min (ref >60)
Glucose, Bld: 103 mg/dL — ABNORMAL HIGH (ref 70–99)
Potassium: 4.3 mmol/L (ref 3.5–5.1)
Sodium: 136 mmol/L (ref 135–145)

## 2019-01-12 MED ORDER — RIVAROXABAN 20 MG PO TABS: 20.0000 mg | ORAL_TABLET | Freq: Every day | ORAL | Status: DC

## 2019-01-12 MED ORDER — NITROGLYCERIN 0.4 MG SL SUBL: 0.4000 mg | SUBLINGUAL_TABLET | SUBLINGUAL | 2 refills | Status: DC | PRN

## 2019-01-12 MED ORDER — ANGIOPLASTY BOOK
Freq: Once | Status: AC
  Administered 2019-01-12: 06:00:00
  Filled 2019-01-12: qty 1

## 2019-01-12 MED ORDER — CLOPIDOGREL BISULFATE 75 MG PO TABS: 75.0000 mg | ORAL_TABLET | Freq: Every day | ORAL | 1 refills | Status: DC

## 2019-01-12 MED ORDER — PANTOPRAZOLE SODIUM 40 MG PO TBEC: 40.0000 mg | DELAYED_RELEASE_TABLET | Freq: Every day | ORAL | 1 refills | Status: DC

## 2019-01-12 MED ORDER — METOPROLOL SUCCINATE ER 50 MG PO TB24
150.0000 mg | ORAL_TABLET | Freq: Every day | ORAL | Status: DC
  Administered 2019-01-12: 150 mg via ORAL
  Filled 2019-01-12: qty 1

## 2019-01-12 MED ORDER — METOPROLOL SUCCINATE ER 50 MG PO TB24: 150.0000 mg | ORAL_TABLET | Freq: Every day | ORAL | 1 refills | Status: DC

## 2019-01-12 MED FILL — Nitroglycerin IV Soln 100 MCG/ML in D5W: INTRA_ARTERIAL | Qty: 10 | Status: AC

## 2019-01-12 MED FILL — PANTOPRAZOLE SOD DR 40 MG T: 40 | 30 days supply | Qty: 30 | Fill #0

## 2019-01-12 MED FILL — METOPROLOL SUCCINATE ER 50: 50 | 30 days supply | Qty: 90 | Fill #0

## 2019-01-12 MED FILL — CLOPIDOGREL 75 MG TABLET: 75 | 90 days supply | Qty: 90 | Fill #0

## 2019-01-12 MED FILL — NITROGLYCERIN 0.4 MG TAB SL: 0.4 | 8 days supply | Qty: 25 | Fill #0

## 2019-01-12 NOTE — Plan of Care (Signed)
  Problem: Education: Goal: Knowledge of General Education information will improve Description: Including pain rating scale, medication(s)/side effects and non-pharmacologic comfort measures Outcome: Completed/Met   Problem: Health Behavior/Discharge Planning: Goal: Ability to manage health-related needs will improve Outcome: Completed/Met   Problem: Clinical Measurements: Goal: Ability to maintain clinical measurements within normal limits will improve Outcome: Completed/Met Goal: Will remain free from infection Outcome: Completed/Met Goal: Diagnostic test results will improve Outcome: Completed/Met Goal: Respiratory complications will improve Outcome: Completed/Met Goal: Cardiovascular complication will be avoided Outcome: Completed/Met   Problem: Activity: Goal: Risk for activity intolerance will decrease Outcome: Completed/Met   Problem: Nutrition: Goal: Adequate nutrition will be maintained Outcome: Completed/Met   Problem: Coping: Goal: Level of anxiety will decrease Outcome: Completed/Met   Problem: Elimination: Goal: Will not experience complications related to bowel motility Outcome: Completed/Met Goal: Will not experience complications related to urinary retention Outcome: Completed/Met   Problem: Pain Managment: Goal: General experience of comfort will improve Outcome: Completed/Met  Pt has been pain free.   Problem: Safety: Goal: Ability to remain free from injury will improve Outcome: Completed/Met   Problem: Skin Integrity: Goal: Risk for impaired skin integrity will decrease Outcome: Completed/Met   Problem: Education: Goal: Understanding of CV disease, CV risk reduction, and recovery process will improve Outcome: Completed/Met Goal: Individualized Educational Video(s) Outcome: Completed/Met   Problem: Activity: Goal: Ability to return to baseline activity level will improve Outcome: Completed/Met   Problem: Cardiovascular: Goal: Ability  to achieve and maintain adequate cardiovascular perfusion will improve Outcome: Completed/Met Goal: Vascular access site(s) Level 0-1 will be maintained Outcome: Completed/Met  Pt's radial site was a level 1. Pt educated on when the bruising should subside. Pt educated on site care & s/s of infection.   Problem: Health Behavior/Discharge Planning: Goal: Ability to safely manage health-related needs after discharge will improve Outcome: Completed/Met

## 2019-01-12 NOTE — Progress Notes (Signed)
Pt given discharge instructions including but not limited to site care, when to call MD, f/u appointments & meds. All questions were answered. Education verified via teachback method. All belongings gathered (including meds) & sent with pt. All questions answered. Hoover Brunette, RN

## 2019-01-12 NOTE — Progress Notes (Signed)
Pt's hr up  Sustaining in the 130s. Pt has been in a-fib but was in the 70s-80s. PO lopressor recently given. Will continue to monitor pt. Hoover Brunette, RN

## 2019-01-12 NOTE — Discharge Summary (Signed)
Discharge Summary    Patient ID: [name],  MRN: [id number], DOB/AGE: Aug 11, 1934 84 y.o.  Admit date: 01/10/2019 Discharge date: 01/12/2019  Primary Care Provider: [name] Primary Cardiologist: Skeet Latch, MD  Discharge Diagnoses    Principal Problem:   Coronary artery disease involving native coronary artery of native heart with angina pectoris [facility]) Active Problems:   HLD (hyperlipidemia)   Essential hypertension   DOE (dyspnea on exertion)   Paroxysmal atrial fibrillation (HCC)   Allergies Allergies  Allergen Reactions  . Penicillins Rash and Other (See Comments)    "Blistering rash" Has patient had a PCN reaction causing immediate rash, facial/tongue/throat swelling, SOB or lightheadedness with hypotension: Yes Has patient had a PCN reaction causing severe rash involving mucus membranes or skin necrosis: No Has patient had a PCN reaction that required hospitalization: No Has patient had a PCN reaction occurring within the last 10 years: No If all of the above answers are "NO", then may proceed with Cephalosporin use.   . Amlodipine Swelling and Other (See Comments)    Peripheral edema  . Lisinopril Cough  . Mevacor [Lovastatin] Other (See Comments)    Caused cataracts and elevated liver enzymes  . Vicodin [Hydrocodone-Acetaminophen] Nausea And Vomiting  . Zocor [Simvastatin] Other (See Comments)    LFT increase with simvastatin and lovastatin   . Oseltamivir Phosphate Itching and Rash    Diagnostic Studies/Procedures    Cath: 01/10/19   Prox LAD lesion is 80% stenosed. Prox LAD to Mid LAD lesion is 60% stenosed with 50% stenosed side branch in 2nd Diag.  Dist LAD lesion is 45% stenosed.  Ost Cx to Prox Cx lesion is 10% stenosed. 1st Mrg lesion is 30% stenosed. Prox Cx to Mid Cx lesion is 30% stenosed.  -----------  Dist RCA lesion is 30% stenosed with 30% stenosed side branch in RPDA.  -----------  The left ventricular  systolic function is normal. The left ventricular ejection fraction is 55-65% by visual estimate.  LV end diastolic pressure is moderately elevated.   SUMMARY  Diffuse calcified coronary disease with severe single-vessel disease involving the proximal LAD -> 75 to 80% followed by extensive 50% stenosis that is DFR positive at 0.82. ? This represents significant progression of disease from 2017  Otherwise mild to moderate disease.  Normal LVEF with moderately elevated LVEDP.   RECOMMENDATIONS  With proximal LAD disease, heavily calcified, this will require atherectomy PCI.  Given the issues with holding Xarelto, and COVID-19 screening test, I feel the best course of action is to place the patient in the hospital tonight and plan for atherectomy tomorrow with Dr. [name]. ->   ? We will load Plavix today 300 mg daily, along with aspirin 324 mg.   ? Would likely be L to restart Xarelto tomorrow night  Continue other home medications.  [name], M.D., M.S. Interventional Cardiologist   Cath: 01/11/19   Prox LAD to Mid LAD lesion is 80% stenosed.  A drug-eluting stent was successfully placed using a STENT RESOLUTE ONYX 3.5X18.  Post intervention, there is a 0% residual stenosis.  Mid LAD lesion is 70% stenosed.  A drug-eluting stent was successfully placed using a STENT RESOLUTE ONYX 2.75X26.  Post intervention, there is a 0% residual stenosis.   1. Severe, heavily calcified stenoses in the proximal and mid LAD 2. Successful orbital atherectomy of the proximal and mid LAD 3. Successful placement of 2 drug eluting stents in the proximal and mid LAD (overlapping)  Recommendations: Continue ASA and Plavix for at least six months.   Diagnostic Dominance: Right  Intervention    _____________   History of Present Illness     Theodore [name] is a 84 y.o. 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  Mr. Rohn was previously a patient of Dr. Mare Ferrari.  Mr. Angelopoulos was diagnosed with atrial fibrillation in 2014.  He was initially on Pradaxa and switched to Xarelto.  He had an episode of chest pain 08/2014 and had a Lexiscan Myoview 08/31/14 that showed an old inferior scar with peri-infarct ischemia and LVEF 49%.  He then had a LHC that revealed 70% ostial LAD, 50% prox LAD, 30% OM1 and 35% RCA.  He was managed medically.  He subsequently had an echo 09/2014 that showed LVEF 55-60% with mild MR and AR and grade 1 diastolic dysfunction.  At that time atorvastatin was increased to 80 mg but he noted leg pain.  He was seen in the lipid clinic and started back on atorvastatin 40mg  and zetia 10 mg.    Mr. Lasanta was seen in clinic 09/25/15 and reported significant shortness of breath with minimal exertion.  He had a LHC with Dr. Ellyn Hack on 09/28/15 that again revealed a 70% ostial-proximal LAD lesion.  Dr. Ellyn Hack performed FFR on that lesion and there was no significant change.  The left ventriculogram performed at that time revealed LVEF 35-45%.  Mr. Zumalt continued to have exertional dyspnea so he was referred for [facility] 10/17/15 that was negative for ischemia. He subsequently underwent DCCV on 10/23/15.  He underwent ablation on 12/19/15 with Dr. [name] and has remained in sinus rhythm.    Mr. Alder underwent L knee replacement on 02/09/17.  Xarelto was held for 2 days prior to surgery and resumed post-operatively.  He developed bleeding at the incision site so Xarelto was again held and he was switched to aspirin.  He subsequently developed a left lower extremity DVT and pulmonary embolism.  Xarelto was resumed.  He had an echocardiogram 03/04/17 that revealed LVEF 50-55% with moderate to severe pulmonary arterial hypertension with a PASP of 60 mmHg. He had pneumonia 12/2017 and subsequently developed a  cough.  He developed shortness of breath and saw both Kerin Ransom, PA-C and Dr. Annamaria Boots in pulmonary.  BNP was 61 and his symptoms were not felt to be due to heart failure.  He ultimately started on Symbicort and felt that in the last month his breathing is started to improve.  He had PFTs obtained 10/2018 that showed moderate obstructive lung disease consistent with chronic bronchitis. However he continued to be very short of breath and tired.  He would get short of breath with minimal exertion such as walking up just a few stairs.  He is very frustrated that he is unable to play golf.  He has no exertional chest pain.  Lately he has not noticed any lower extremity edema, orthopnea, or PND.  He wonders if there could be progression of his coronary disease. Given his symptoms options were discussed and he was set up for outpatient cardiac cath.   Hospital Course     Underwent cardiac cath noted above Dr. Ellyn Hack on 11/30 noted above with heavily calcified plaque in the pLAD that would require atherectomy. He was loaded with plavix 300mg  and 324mg  ASA. Presented back to the cath lab with Dr. Angelena Form  with successful orbital atherectomy of the p/mLAD with overlapping DES x2. Plan will be for plavix and Xarelto for at least 6 months. No ASA. Apparently developed Afib after returning from second cath but was asymptomatic. Rates were elevated with ambulation and metoprolol was increased to 150mg  daily. He was resumed on Xarelto the following day as radial site was bruised but stable without hematoma. Will plan for follow up with EP regarding management of his Afib if he remains in Afib at the time of follow up appt.   General: Well developed, well nourished, male appearing in no acute distress. Head: Normocephalic, atraumatic.  Neck: Supple without bruits, JVD. Lungs:  Resp regular and unlabored, CTA. Heart: Irreg Irreg, S1, S2, no S3, S4, or murmur; no rub. Abdomen: Soft, non-tender, non-distended with  normoactive bowel sounds. No hepatomegaly. No rebound/guarding. No obvious abdominal masses. Extremities: No clubbing, cyanosis, edema. Distal pedal pulses are 2+ bilaterally. Right radial cath site stable with bruising but no hematoma Neuro: Alert and oriented X 3. Moves all extremities spontaneously. Psych: Normal affect.  KALIM PICONE was seen by Dr. Martinique and determined stable for discharge home. Follow up in the office has been arranged. Medications are listed below.   _____________  Discharge Vitals Blood pressure 136/76, pulse 77, temperature 97.8 F (36.6 C), temperature source Oral, resp. rate 18, height 5\' 11"  (1.803 m), weight 100.2 kg, SpO2 98 %.  Filed Weights   01/10/19 0705 01/11/19 1749 01/12/19 0656  Weight: 99.8 kg 102.2 kg 100.2 kg    Labs & Radiologic Studies    CBC Recent Labs    01/11/19 0239 01/12/19 0358  WBC 6.4 5.8  HGB 12.8* 13.0  HCT 38.1* 38.1*  MCV 96.9 96.9  PLT 118* [id number]*   Basic Metabolic Panel Recent Labs    01/11/19 0239 01/12/19 0358  NA 135 136  K 4.3 4.3  CL 102 104  CO2 23 24  GLUCOSE 99 103*  BUN 14 12  CREATININE 1.03 0.94  CALCIUM 8.7* 8.7*   Liver Function Tests No results for input(s): AST, ALT, ALKPHOS, BILITOT, PROT, ALBUMIN in the last 72 hours. No results for input(s): LIPASE, AMYLASE in the last 72 hours. Cardiac Enzymes No results for input(s): CKTOTAL, CKMB, CKMBINDEX, TROPONINI in the last 72 hours. BNP Invalid input(s): POCBNP D-Dimer No results for input(s): DDIMER in the last 72 hours. Hemoglobin A1C No results for input(s): HGBA1C in the last 72 hours. Fasting Lipid Panel No results for input(s): CHOL, HDL, LDLCALC, TRIG, CHOLHDL, LDLDIRECT in the last 72 hours. Thyroid Function Tests No results for input(s): TSH, T4TOTAL, T3FREE, THYROIDAB in the last 72 hours.  Invalid input(s): FREET3 _____________  No results found. Disposition   Pt is being discharged home today in good condition.   Follow-up Plans & Appointments    Follow-up Information    Erlene Quan, PA-C Follow up on 01/26/2019.   Specialties: Cardiology, Radiology Why: at 8:30am for your follow up appt.  Contact information: [address] [place] [address] [phone]          Discharge Instructions    Amb Referral to Cardiac Rehabilitation   Complete by: As directed    Diagnosis: Coronary Stents   After initial evaluation and assessments completed: Virtual Based Care may be provided alone or in conjunction with Phase 2 Cardiac Rehab based on patient barriers.: Yes   Call MD for:  persistant dizziness or light-headedness   Complete by: As directed    Call  MD for:  redness, tenderness, or signs of infection (pain, swelling, redness, odor or green/yellow discharge around incision site)   Complete by: As directed    Diet - low sodium heart healthy   Complete by: As directed    Discharge instructions   Complete by: As directed    Radial Site Care Refer to this sheet in the next few weeks. These instructions provide you with information on caring for yourself after your procedure. Your caregiver may also give you more specific instructions. Your treatment has been planned according to current medical practices, but problems sometimes occur. Call your caregiver if you have any problems or questions after your procedure. HOME CARE INSTRUCTIONS You may shower the day after the procedure.Remove the bandage (dressing) and gently wash the site with plain soap and water.Gently pat the site dry.  Do not apply powder or lotion to the site.  Do not submerge the affected site in water for 3 to 5 days.  Inspect the site at least twice daily.  Do not flex or bend the affected arm for 24 hours.  No lifting over 5 pounds (2.3 kg) for 5 days after your procedure.  Do not drive home if you are discharged the same day of the procedure. Have someone else drive you.  You may drive 24 hours after the  procedure unless otherwise instructed by your caregiver.  What to expect: Any bruising will usually fade within 1 to 2 weeks.  Blood that collects in the tissue (hematoma) may be painful to the touch. It should usually decrease in size and tenderness within 1 to 2 weeks.  SEEK IMMEDIATE MEDICAL CARE IF: You have unusual pain at the radial site.  You have redness, warmth, swelling, or pain at the radial site.  You have drainage (other than a small amount of blood on the dressing).  You have chills.  You have a fever or persistent symptoms for more than 72 hours.  You have a fever and your symptoms suddenly get worse.  Your arm becomes pale, cool, tingly, or numb.  You have heavy bleeding from the site. Hold pressure on the site.   PLEASE DO NOT MISS ANY DOSES OF YOUR PLAVIX!!!!! Also keep a log of you blood pressures and bring back to your follow up appt. Please call the office with any questions.   Patients taking blood thinners should generally stay away from medicines like ibuprofen, Advil, Motrin, naproxen, and Aleve due to risk of stomach bleeding. You may take Tylenol as directed or talk to your primary doctor about alternatives.  Some studies suggest Prilosec/Omeprazole interacts with Plavix. We changed your Prilosec/Omeprazole to the equivalent dose of Protonix for less chance of interaction.   Increase activity slowly   Complete by: As directed        Discharge Medications     Medication List    STOP taking these medications   omeprazole 20 MG capsule Commonly known as: PRILOSEC Replaced by: pantoprazole 40 MG tablet     TAKE these medications   acetaminophen 500 MG tablet Commonly known as: TYLENOL Take 1 tablet (500 mg total) by mouth 3 (three) times daily as needed.   AeroChamber MV inhaler Use as instructed   atorvastatin 40 MG tablet Commonly known as: LIPITOR TAKE 1 TABLET DAILY   budesonide-formoterol 160-4.5 MCG/ACT inhaler Commonly known as:  Symbicort Inhale 2 puffs into the lungs 2 (two) times daily.   clopidogrel 75 MG tablet Commonly known as: PLAVIX Take 1 tablet (  75 mg total) by mouth daily with breakfast. Start taking on: January 13, 2019   ezetimibe 10 MG tablet Commonly known as: ZETIA TAKE 1 TABLET DAILY   furosemide 40 MG tablet Commonly known as: LASIX TAKE 1 TABLET DAILY (NEW DOSE) What changed: See the new instructions.   losartan 100 MG tablet Commonly known as: COZAAR TAKE 1 TABLET ONCE DAILY What changed:   how much to take  how to take this  when to take this  additional instructions   Lysine 500 MG Tabs Take 500 tablets by mouth daily.   metoprolol succinate 50 MG 24 hr tablet Commonly known as: TOPROL-XL Take 3 tablets (150 mg total) by mouth daily. Take with or immediately following a meal. What changed:   medication strength  how much to take  how to take this  when to take this  additional instructions   nitroGLYCERIN 0.4 MG SL tablet Commonly known as: Nitrostat Place 1 tablet (0.4 mg total) under the tongue every 5 (five) minutes as needed.   pantoprazole 40 MG tablet Commonly known as: PROTONIX Take 1 tablet (40 mg total) by mouth daily. Replaces: omeprazole 20 MG capsule   potassium chloride 10 MEQ tablet Commonly known as: KLOR-CON TAKE 1 TABLET TWICE A DAY   VITAMIN C PO Take 500 mg by mouth daily.   Vitamin D3 50 MCG (2000 UT) Tabs Take 2,000 Units by mouth daily.   Xarelto 20 MG Tabs tablet Generic drug: rivaroxaban TAKE 1 TABLET DAILY WITH SUPPER What changed: See the new instructions.        No                               Did the patient have a percutaneous coronary intervention (stent / angioplasty)?:  Yes.    Cath/PCI Registry Performance & Quality Measures: 1. Aspirin prescribed? - No - Plan for Xarelto and plavix 2. ADP Receptor Inhibitor (Plavix/Clopidogrel, Brilinta/Ticagrelor or Effient/Prasugrel) prescribed (includes medically  managed patients)? - Yes 3. High Intensity Statin (Lipitor 40-80mg  or Crestor 20-40mg ) prescribed? - Yes 4. For EF <40%, was ACEI/ARB prescribed? - Yes 5. For EF <40%, Aldosterone Antagonist (Spironolactone or Eplerenone) prescribed? - Not Applicable (EF >/= [id number]) 6. Cardiac Rehab Phase II ordered (Included Medically managed Patients)? - Yes   Outstanding Labs/Studies   N/a  Duration of Discharge Encounter   Greater than 30 minutes including physician time.  Signed, Reino Bellis NP-C 01/12/2019, 10:28 AM

## 2019-01-12 NOTE — TOC Benefit Eligibility Note (Signed)
Transition of Care [facility]) Benefit Eligibility Note    Patient Details  Name: Theodore [name] MRN: [id number] Date of Birth: [birth year]   Medication/Dose: Alveda Reasons 20 MG DAILY  Covered?: Yes     Prescription Coverage Preferred Pharmacy: [place] with Person/Company/Phone Number:: [facility]  @  [employer] [facility] # [id number]  Co-Pay: $ 127.68  Prior Approval: No  Deductible: Met(COVERAGE GAP)  Additional Notes: 90 DAY SUPPLY  FOR M/O $251.96    Memory Argue Phone Number: 01/12/2019, 11:24 AM

## 2019-01-12 NOTE — Telephone Encounter (Signed)
Per Ria Comment NP at [facility], patient has TOC appt on 01/26/19 at 8:30 with [facility]

## 2019-01-12 NOTE — Discharge Instructions (Signed)
DRINK PLENTY OF FLUIDS FOR THE NEXT 2-3 DAYS.  KEEP AFFECTED ARM ELEVATED FOR THE REMAINDER OF THE DAY.  Radial Site Care  This sheet gives you information about how to care for yourself after your procedure. Your health care provider may also give you more specific instructions. If you have problems or questions, contact your health care provider. What can I expect after the procedure? After the procedure, it is common to have:  Bruising and tenderness at the catheter insertion area. Follow these instructions at home: Medicines  Take over-the-counter and prescription medicines only as told by your health care provider. Insertion site care  Follow instructions from your health care provider about how to take care of your insertion site. Make sure you: ? Wash your hands with soap and water before you change your bandage (dressing). If soap and water are not available, use hand sanitizer. ? Change your dressing as told by your health care provider.  Check your insertion site every day for signs of infection. Check for: ? Redness, swelling, or pain. ? Fluid or blood. ? Pus or a bad smell. ? Warmth.  Do not take baths, swim, or use a hot tub until your health care provider approves.  You may shower 24-48 hours after the procedure. ? Remove the dressing and gently wash the site with plain soap and water. ? Pat the area dry with a clean towel. ? Do not rub the site. That could cause bleeding.  Do not apply powder or lotion to the site. Activity   For 24 hours after the procedure, or as directed by your health care provider: ? Do not flex or bend the affected arm. ? Do not push or pull heavy objects with the affected arm. ? Do not drive yourself home from the hospital or clinic. You may drive 24 hours after the procedure unless your health care provider tells you not to. ? Do not operate machinery or power tools.  Do not lift anything that is heavier than 10 lb for 5  days.  Ask your health care provider when it is okay to: ? Return to work or school. ? Resume usual physical activities or sports. ? Resume sexual activity. General instructions  If the catheter site starts to bleed, raise your arm and put firm pressure on the site. If the bleeding does not stop, get help right away. This is a medical emergency.  If you went home on the same day as your procedure, a responsible adult should be with you for the first 24 hours after you arrive home.  Keep all follow-up visits as told by your health care provider. This is important. Contact a health care provider if:  You have a fever.  You have redness, swelling, or yellow drainage around your insertion site. Get help right away if:  You have unusual pain at the radial site.  The catheter insertion area swells very fast.  The insertion area is bleeding, and the bleeding does not stop when you hold steady pressure on the area.  Your arm or hand becomes pale, cool, tingly, or numb. These symptoms may represent a serious problem that is an emergency. Do not wait to see if the symptoms will go away. Get medical help right away. Call your local emergency services (911 in the [place] Do not drive yourself to the hospital. Summary  After the procedure, it is common to have bruising and tenderness at the site.  Follow instructions from your health care provider  about how to take care of your radial site wound. Check the wound every day for signs of infection.  Do not lift anything that is heavier than 10 lb for 5 days.  This information is not intended to replace advice given to you by your health care provider. Make sure you discuss any questions you have with your health care provider. Document Released: 03/01/2010 Document Revised: 03/04/2017 Document Reviewed: 03/04/2017 Elsevier Patient Education  2020 [place] about your medication: Plavix (anti-platelet agent)  Generic Name  (Brand): clopidogrel (Plavix), once daily medication  PURPOSE: You are taking this medication along with aspirin to lower your chance of having a heart attack, stroke, or blood clots in your heart stent. These can be fatal. Brilinta and aspirin help prevent platelets from sticking together and forming a clot that can block an artery or your stent.   Common SIDE EFFECTS you may experience include: bruising or bleeding more easily, shortness of breath  Do not stop taking PLAVIX without talking to the doctor who prescribes it for you. People who are treated with a stent and stop taking Plavix too soon, have a higher risk of getting a blood clot in the stent, having a heart attack, or dying. If you stop Plavix because of bleeding, or for other reasons, your risk of a heart attack or stroke may increase.   Tell all of your doctors and dentists that you are taking Plavix. They should talk to the doctor who prescribed plavix for you before you have any surgery or invasive procedure.   Contact your health care provider if you experience: severe or uncontrollable bleeding, pink/red/brown urine, vomiting blood or vomit that looks like "coffee grounds", red or black stools (looks like tar), coughing up blood or blood clots ----------------------------------------------------------------------------------------------------------------------  Information on my medicine - XARELTO (Rivaroxaban)  Why was Xarelto prescribed for you? Xarelto was prescribed for you to reduce the risk of a blood clot forming that can cause a stroke if you have a medical condition called atrial fibrillation (a type of irregular heartbeat).  What do you need to know about xarelto ? Take your Xarelto ONCE DAILY at the same time every day with your evening meal. If you have difficulty swallowing the tablet whole, you may crush it and mix in applesauce just prior to taking your dose.  Take Xarelto exactly as prescribed by your  doctor and DO NOT stop taking Xarelto without talking to the doctor who prescribed the medication.  Stopping without other stroke prevention medication to take the place of Xarelto may increase your risk of developing a clot that causes a stroke.  Refill your prescription before you run out.  After discharge, you should have regular check-up appointments with your healthcare provider that is prescribing your Xarelto.  In the future your dose may need to be changed if your kidney function or weight changes by a significant amount.  What do you do if you miss a dose? If you are taking Xarelto ONCE DAILY and you miss a dose, take it as soon as you remember on the same day then continue your regularly scheduled once daily regimen the next day. Do not take two doses of Xarelto at the same time or on the same day.   Important Safety Information A possible side effect of Xarelto is bleeding. You should call your healthcare provider right away if you experience any of the following: ? Bleeding from an injury or your nose that does not stop. ?  Unusual colored urine (red or dark brown) or unusual colored stools (red or black). ? Unusual bruising for unknown reasons. ? A serious fall or if you hit your head (even if there is no bleeding).  Some medicines may interact with Xarelto and might increase your risk of bleeding while on Xarelto. To help avoid this, consult your healthcare provider or pharmacist prior to using any new prescription or non-prescription medications, including herbals, vitamins, non-steroidal anti-inflammatory drugs (NSAIDs) and supplements.  This website has more information on Xarelto: [email or web address]

## 2019-01-12 NOTE — Progress Notes (Signed)
CARDIAC REHAB PHASE I   PRE:  Rate/Rhythm: 87 afib  BP:  Supine:   Sitting: 136/96  Standing:    SaO2: 98%RA  MODE:  Ambulation: 470 ft   POST:  Rate/Rhythm: 130 afib  90s with rest  BP:  Supine:   Sitting: 168/83  Standing:    SaO2: 97%RA 0935-1026 Pt walked 470 ft on RA with steady gait. Tolerated well except for elevated HR. No CP. Discussed importance of plavix with stent. Reviewed NTG use, heart healthy food choices, walking for ex, and CRP 2. Referring to [facility] program. Pt will consider but will take in to consideration COVID and his comfort level coming to hospital to ex. . Pt is interested in participating in Virtual Cardiac and Pulmonary Rehab. Pt advised that Virtual Cardiac and Pulmonary Rehab is provided at no cost to the patient.  Checklist:  1. Pt has smart device  ie smartphone and/or ipad for downloading an app  Yes 2. Reliable internet/wifi service    Yes 3. Understands how to use their smartphone and navigate within an app.  Yes   Pt verbalized understanding and is in agreement.    Graylon Good, RN BSN  01/12/2019 10:21 AM

## 2019-01-17 ENCOUNTER — Other Ambulatory Visit: Payer: Self-pay | Admitting: Family Medicine

## 2019-01-17 DIAGNOSIS — E7800 Pure hypercholesterolemia, unspecified: Secondary | ICD-10-CM

## 2019-01-17 DIAGNOSIS — D696 Thrombocytopenia, unspecified: Secondary | ICD-10-CM

## 2019-01-17 NOTE — Telephone Encounter (Signed)
LM2CB 

## 2019-01-18 ENCOUNTER — Other Ambulatory Visit (INDEPENDENT_AMBULATORY_CARE_PROVIDER_SITE_OTHER): Payer: Medicare Other

## 2019-01-18 ENCOUNTER — Other Ambulatory Visit: Payer: Self-pay

## 2019-01-18 DIAGNOSIS — D696 Thrombocytopenia, unspecified: Secondary | ICD-10-CM | POA: Diagnosis not present

## 2019-01-18 DIAGNOSIS — E7800 Pure hypercholesterolemia, unspecified: Secondary | ICD-10-CM | POA: Diagnosis not present

## 2019-01-18 LAB — CBC WITH DIFFERENTIAL/PLATELET
Basophils Absolute: 0 10*3/uL (ref 0.0–0.1)
Basophils Relative: 0.3 % (ref 0.0–3.0)
Eosinophils Absolute: 0 10*3/uL (ref 0.0–0.7)
Eosinophils Relative: 0.6 % (ref 0.0–5.0)
HCT: 45.5 % (ref 39.0–52.0)
Hemoglobin: 15.5 g/dL (ref 13.0–17.0)
Lymphocytes Relative: 32.8 % (ref 12.0–46.0)
Lymphs Abs: 2.1 10*3/uL (ref 0.7–4.0)
MCHC: 34.1 g/dL (ref 30.0–36.0)
MCV: 97.7 fl (ref 78.0–100.0)
Monocytes Absolute: 0.6 10*3/uL (ref 0.1–1.0)
Monocytes Relative: 9.3 % (ref 3.0–12.0)
Neutro Abs: 3.7 10*3/uL (ref 1.4–7.7)
Neutrophils Relative %: 57 % (ref 43.0–77.0)
Platelets: 171 10*3/uL (ref 150.0–400.0)
RBC: 4.66 Mil/uL (ref 4.22–5.81)
RDW: 13.7 % (ref 11.5–15.5)
WBC: 6.5 10*3/uL (ref 4.0–10.5)

## 2019-01-18 LAB — LIPID PANEL
Cholesterol: 141 mg/dL (ref 0–200)
HDL: 59.1 mg/dL (ref >39.00)
LDL Cholesterol: 68 mg/dL (ref 0–99)
NonHDL: 81.59
Total CHOL/HDL Ratio: 2
Triglycerides: 70 mg/dL (ref 0.0–149.0)
VLDL: 14 mg/dL (ref 0.0–40.0)

## 2019-01-18 LAB — COMPREHENSIVE METABOLIC PANEL
ALT: 20 U/L (ref 0–53)
AST: 19 U/L (ref 0–37)
Albumin: 4.2 g/dL (ref 3.5–5.2)
Alkaline Phosphatase: 66 U/L (ref 39–117)
BUN: 17 mg/dL (ref 6–23)
CO2: 31 mEq/L (ref 19–32)
Calcium: 9.6 mg/dL (ref 8.4–10.5)
Chloride: 96 mEq/L (ref 96–112)
Creatinine, Ser: 1.13 mg/dL (ref 0.40–1.50)
GFR: 62.07 mL/min (ref >60.00)
Glucose, Bld: 102 mg/dL — ABNORMAL HIGH (ref 70–99)
Potassium: 4.2 mEq/L (ref 3.5–5.1)
Sodium: 133 mEq/L — ABNORMAL LOW (ref 135–145)
Total Bilirubin: 1 mg/dL (ref 0.2–1.2)
Total Protein: 6.9 g/dL (ref 6.0–8.3)

## 2019-01-18 NOTE — Telephone Encounter (Signed)
Patient contacted regarding discharge from [place] on 01/12/19.  Patient understands to follow up with provider Kerin Ransom  PA on 01/26/19 at 8:30 AM  at [facility]. Patient understands discharge instructions? YES Patient understands medications and regiment? YES Patient understands to bring all medications to this visit? YES  Only complaint is in and out of afib and that occurred while in hospital ./cy   P

## 2019-01-18 NOTE — Addendum Note (Signed)
Addended by: Cloyd Stagers on: 01/18/2019 08:09 AM   Modules accepted: Orders

## 2019-01-25 ENCOUNTER — Encounter: Payer: Self-pay | Admitting: Family Medicine

## 2019-01-25 ENCOUNTER — Other Ambulatory Visit: Payer: Self-pay

## 2019-01-25 ENCOUNTER — Ambulatory Visit (INDEPENDENT_AMBULATORY_CARE_PROVIDER_SITE_OTHER): Payer: Medicare Other | Admitting: Family Medicine

## 2019-01-25 VITALS — BP 124/70 | HR 66 | Temp 98.2°F | Ht 70.0 in | Wt 214.6 lb

## 2019-01-25 DIAGNOSIS — I10 Essential (primary) hypertension: Secondary | ICD-10-CM

## 2019-01-25 DIAGNOSIS — R05 Cough: Secondary | ICD-10-CM | POA: Diagnosis not present

## 2019-01-25 DIAGNOSIS — I480 Paroxysmal atrial fibrillation: Secondary | ICD-10-CM

## 2019-01-25 DIAGNOSIS — I428 Other cardiomyopathies: Secondary | ICD-10-CM

## 2019-01-25 DIAGNOSIS — Z0001 Encounter for general adult medical examination with abnormal findings: Secondary | ICD-10-CM | POA: Diagnosis not present

## 2019-01-25 DIAGNOSIS — R5383 Other fatigue: Secondary | ICD-10-CM

## 2019-01-25 DIAGNOSIS — R053 Chronic cough: Secondary | ICD-10-CM

## 2019-01-25 DIAGNOSIS — J449 Chronic obstructive pulmonary disease, unspecified: Secondary | ICD-10-CM

## 2019-01-25 DIAGNOSIS — K219 Gastro-esophageal reflux disease without esophagitis: Secondary | ICD-10-CM

## 2019-01-25 DIAGNOSIS — D696 Thrombocytopenia, unspecified: Secondary | ICD-10-CM

## 2019-01-25 DIAGNOSIS — E66811 Obesity, class 1: Secondary | ICD-10-CM

## 2019-01-25 DIAGNOSIS — E669 Obesity, unspecified: Secondary | ICD-10-CM

## 2019-01-25 DIAGNOSIS — Z7901 Long term (current) use of anticoagulants: Secondary | ICD-10-CM

## 2019-01-25 DIAGNOSIS — R0609 Other forms of dyspnea: Secondary | ICD-10-CM

## 2019-01-25 DIAGNOSIS — R0600 Dyspnea, unspecified: Secondary | ICD-10-CM

## 2019-01-25 DIAGNOSIS — E7800 Pure hypercholesterolemia, unspecified: Secondary | ICD-10-CM

## 2019-01-25 DIAGNOSIS — I700 Atherosclerosis of aorta: Secondary | ICD-10-CM

## 2019-01-25 NOTE — Assessment & Plan Note (Signed)
Chronic, stable on pantoprazole 40mg  daily.

## 2019-01-25 NOTE — Assessment & Plan Note (Signed)
Sounds regular today. Continues xarelto.  

## 2019-01-25 NOTE — Progress Notes (Signed)
This visit was conducted in person.  BP 124/70 (BP Location: Left Arm, Patient Position: Sitting, Cuff Size: Normal)   Pulse 66   Temp 98.2 F (36.8 C) (Temporal)   Ht 5\' 10"  (1.778 m)   Wt 214 lb 9 oz (97.3 kg)   SpO2 96%   BMI 30.79 kg/m    CC: CPE Subjective:    Patient ID: Theodore [name], male    DOB: 05/31/1934, 84 y.o.   MRN: [id number]  HPI: Theodore [name] is a 84 y.o. male presenting on 01/25/2019 for Annual Exam (Prt 2.  Requests [place] Disablity Parking Placard. )   Saw health advisor 07/2018 for medicare wellness visit. Note reviewed.   No exam data present    Clinical Support from 08/06/2018 in [place] at [facility] Total Score  0      Fall Risk  08/06/2018 07/23/2017 01/13/2017 07/17/2016 02/06/2016  Falls in the past year? 0 No No No No     Tough 2 years with knee replacement followed by pulm embolism then pneumonia. Chronic cough since then. Heart catheterization 2 wks ago showing significant blockages. S/p coronary atherectomy and coronary stents (DES x2 to LAD) placed 01/11/2019. Complicated by afib recurrence. Large bruising to R forearm after procedures. Upcoming f/u appt tomorrow with cards. Saw pulm ([place]) - on symbicort (breztri was not covered by insurance) planned f/u 6 months. PFTs 10/2018 - moderate airway obstruction is present suggesting small airway disease  Requests handicap placard application for dyspnea on exertion. Ongoing struggle with fatigue/low energy. Struggling with isolation due to Covid pandemic.   Preventative: COLONOSCOPY WITH PROPOFOL 12/02/2018 - TAx2, HP, diverticulosis [name], [name], MD) Also had EGD 11/2018 - medium HH [name]) Prostate cancer screening -always normal saw urology. Age out Lung cancer screening -non smoker Flu shotyearly Tetanus shotunsure  Pneumovax 2011, prevnar 2016 zostavax-~2014 shingrix - discussed Advanced directive-Advanced directives: scanned and in chart 07/2016. Wants wife  Bacari Sherrin to be HCPOA then children. Grants HCPOA discretion for medical decisions Seat belt use discussed  Sunscreen usediscussed. No changing moles on skin. Sees derm.  Non smoker  Alcohol - 1-2 beers/day  Dentist q6 mo  Eye exam yearly  Bowel - no constipation  Bladder - no incontinence   Lives with wife, no pets Retired Probation officer: Art gallery manager Edu: master's Activity: golfand walking- limited due to dyspnea Diet: good water, fruits/vegetables daily     Relevant past medical, surgical, family and social history reviewed and updated as indicated. Interim medical history since our last visit reviewed. Allergies and medications reviewed and updated. Outpatient Medications Prior to Visit  Medication Sig Dispense Refill  . acetaminophen (TYLENOL) 500 MG tablet Take 1 tablet (500 mg total) by mouth 3 (three) times daily as needed. 30 tablet 0  . Ascorbic Acid (VITAMIN C PO) Take 500 mg by mouth daily.     [name] atorvastatin (LIPITOR) 40 MG tablet TAKE 1 TABLET DAILY (Patient taking differently: Take 40 mg by mouth daily. ) 90 tablet 3  . budesonide-formoterol (SYMBICORT) 160-4.5 MCG/ACT inhaler Inhale 2 puffs into the lungs 2 (two) times daily. 3 Inhaler 1  . Cholecalciferol (VITAMIN D3) 2000 units TABS Take 2,000 Units by mouth daily.    . clopidogrel (PLAVIX) 75 MG tablet Take 1 tablet (75 mg total) by mouth daily with breakfast. 90 tablet 1  . ezetimibe (ZETIA) 10 MG tablet TAKE 1 TABLET DAILY (Patient taking differently: Take 10 mg by mouth daily. ) 90 tablet 3  .  furosemide (LASIX) 40 MG tablet TAKE 1 TABLET DAILY (NEW DOSE) (Patient taking differently: Take 40 mg by mouth daily. ) 90 tablet 3  . losartan (COZAAR) 100 MG tablet TAKE 1 TABLET ONCE DAILY (Patient taking differently: Take 100 mg by mouth daily. ) 90 tablet 3  . Lysine 500 MG TABS Take 500 tablets by mouth daily.     . metoprolol succinate (TOPROL-XL) 50 MG 24 hr tablet Take 3 tablets (150 mg total) by mouth daily.  Take with or immediately following a meal. 90 tablet 1  . nitroGLYCERIN (NITROSTAT) 0.4 MG SL tablet Place 1 tablet (0.4 mg total) under the tongue every 5 (five) minutes as needed. 25 tablet 2  . pantoprazole (PROTONIX) 40 MG tablet Take 1 tablet (40 mg total) by mouth daily. 30 tablet 1  . potassium chloride (K-DUR,KLOR-CON) 10 MEQ tablet TAKE 1 TABLET TWICE A DAY (Patient taking differently: Take 10 mEq by mouth 2 (two) times daily. ) 180 tablet 4  . Spacer/Aero-Holding Chambers (AEROCHAMBER MV) inhaler Use as instructed 1 each 0  . XARELTO 20 MG TABS tablet TAKE 1 TABLET DAILY WITH SUPPER (Patient taking differently: Take 20 mg by mouth at bedtime. ) 90 tablet 1   No facility-administered medications prior to visit.     Per HPI unless specifically indicated in ROS section below Review of Systems  Constitutional: Negative for activity change, appetite change, chills, fatigue, fever and unexpected weight change.  HENT: Negative for hearing loss.   Eyes: Negative for visual disturbance.  Respiratory: Positive for shortness of breath (chronic with exertion). Negative for cough, chest tightness and wheezing.   Cardiovascular: Positive for palpitations. Negative for chest pain and leg swelling.  Gastrointestinal: Negative for abdominal distention, abdominal pain, blood in stool, constipation, diarrhea, nausea and vomiting.  Genitourinary: Negative for difficulty urinating and hematuria.  Musculoskeletal: Negative for arthralgias, myalgias and neck pain.  Skin: Negative for rash.  Neurological: Negative for dizziness, seizures, syncope and headaches.  Hematological: Negative for adenopathy. Bruises/bleeds easily.  Psychiatric/Behavioral: Negative for dysphoric mood. The patient is not nervous/anxious.    Objective:    BP 124/70 (BP Location: Left Arm, Patient Position: Sitting, Cuff Size: Normal)   Pulse 66   Temp 98.2 F (36.8 C) (Temporal)   Ht 5\' 10"  (1.778 m)   Wt 214 lb 9 oz (97.3  kg)   SpO2 96%   BMI 30.79 kg/m   Wt Readings from Last 3 Encounters:  01/25/19 214 lb 9 oz (97.3 kg)  01/12/19 221 lb (100.2 kg)  12/29/18 218 lb 6.4 oz (99.1 kg)    Physical Exam Vitals and nursing note reviewed.  Constitutional:      General: He is not in acute distress.    Appearance: Normal appearance. He is well-developed. He is not ill-appearing.  HENT:     Head: Normocephalic and atraumatic.     Right Ear: Hearing, tympanic membrane, ear canal and external ear normal.     Left Ear: Hearing, tympanic membrane, ear canal and external ear normal.     Nose: Nose normal.     Mouth/Throat:     Pharynx: Uvula midline. No oropharyngeal exudate or posterior oropharyngeal erythema.  Eyes:     General: No scleral icterus.    Conjunctiva/sclera: Conjunctivae normal.     Pupils: Pupils are equal, round, and reactive to light.  Cardiovascular:     Rate and Rhythm: Normal rate and regular rhythm.     Pulses: Normal pulses.  Radial pulses are 2+ on the right side and 2+ on the left side.     Heart sounds: Normal heart sounds. No murmur.  Pulmonary:     Effort: Pulmonary effort is normal. No respiratory distress.     Breath sounds: Normal breath sounds. No wheezing, rhonchi or rales.  Abdominal:     General: Abdomen is flat. Bowel sounds are normal. There is no distension.     Palpations: Abdomen is soft. There is no mass.     Tenderness: There is no abdominal tenderness. There is no guarding or rebound.     Hernia: No hernia is present.  Musculoskeletal:        General: Normal range of motion.     Cervical back: Normal range of motion and neck supple.  Lymphadenopathy:     Cervical: No cervical adenopathy.  Skin:    General: Skin is warm and dry.     Findings: No rash.  Neurological:     Mental Status: He is alert and oriented to person, place, and time.     Comments: CN grossly intact, station and gait intact  Psychiatric:        Behavior: Behavior normal.         Thought Content: Thought content normal.        Judgment: Judgment normal.       Results for orders placed or performed in visit on 01/18/19  Comprehensive metabolic panel  Result Value Ref Range   Sodium 133 (L) 135 - 145 mEq/L   Potassium 4.2 3.5 - 5.1 mEq/L   Chloride 96 96 - 112 mEq/L   CO2 31 19 - 32 mEq/L   Glucose, Bld 102 (H) 70 - 99 mg/dL   BUN 17 6 - 23 mg/dL   Creatinine, Ser 1.13 0.40 - 1.50 mg/dL   Total Bilirubin 1.0 0.2 - 1.2 mg/dL   Alkaline Phosphatase 66 39 - 117 U/L   AST 19 0 - 37 U/L   ALT 20 0 - 53 U/L   Total Protein 6.9 6.0 - 8.3 g/dL   Albumin 4.2 3.5 - 5.2 g/dL   GFR 62.07 >60.00 mL/min   Calcium 9.6 8.4 - 10.5 mg/dL  Lipid panel  Result Value Ref Range   Cholesterol 141 0 - 200 mg/dL   Triglycerides 70.0 0.0 - 149.0 mg/dL   HDL 59.10 >39.00 mg/dL   VLDL 14.0 0.0 - 40.0 mg/dL   LDL Cholesterol 68 0 - 99 mg/dL   Total CHOL/HDL Ratio 2    NonHDL 81.59   CBC with Differential/Platelet  Result Value Ref Range   WBC 6.5 4.0 - 10.5 K/uL   RBC 4.66 4.22 - 5.81 Mil/uL   Hemoglobin 15.5 13.0 - 17.0 g/dL   HCT 45.5 39.0 - 52.0 %   MCV 97.7 78.0 - 100.0 fl   MCHC 34.1 30.0 - 36.0 g/dL   RDW 13.7 11.5 - 15.5 %   Platelets 171.0 150.0 - 400.0 K/uL   Neutrophils Relative % 57.0 43.0 - 77.0 %   Lymphocytes Relative 32.8 12.0 - 46.0 %   Monocytes Relative 9.3 3.0 - 12.0 %   Eosinophils Relative 0.6 0.0 - 5.0 %   Basophils Relative 0.3 0.0 - 3.0 %   Neutro Abs 3.7 1.4 - 7.7 K/uL   Lymphs Abs 2.1 0.7 - 4.0 K/uL   Monocytes Absolute 0.6 0.1 - 1.0 K/uL   Eosinophils Absolute 0.0 0.0 - 0.7 K/uL   Basophils Absolute 0.0 0.0 - 0.1  K/uL   Lab Results  Component Value Date   TSH 1.63 08/10/2018   Lab Results  Component Value Date   VITAMINB12 680 08/10/2018    Assessment & Plan:  This visit occurred during the SARS-CoV-2 public health emergency.  Safety protocols were in place, including screening questions prior to the visit, additional usage of staff  PPE, and extensive cleaning of exam room while observing appropriate contact time as indicated for disinfecting solutions.   Problem List Items Addressed This Visit    Thrombocytopenia ([place])    Improved on latest check.       Thoracic aorta atherosclerosis (HCC)    Continue statin, plavix.       Paroxysmal atrial fibrillation (HCC) (Chronic)    Sounds regular today. Continues xarelto.       Obesity, Class I, BMI 30.0-34.9 (see actual BMI)   Nonischemic cardiomyopathy (HCC)   HLD (hyperlipidemia)    Chronic, stable on lipitor and zetia.  The ASCVD Risk score Mikey Bussing DC Jr., et al., 2013) failed to calculate for the following reasons:   The 2013 ASCVD risk score is only valid for ages 51 to 61       GERD (gastroesophageal reflux disease)    Chronic, stable on pantoprazole 40mg  daily.       Fatigue    Ongoing, affecting daily activities. Anticipate pulm/cards contributions. He had normal TSH, B12 07/2018. He is not anemic.       Essential hypertension (Chronic)    Chronic, stable. Continue current regimen.       Encounter for general adult medical examination with abnormal findings - Primary    Preventative protocols reviewed and updated unless pt declined. Discussed healthy diet and lifestyle.       DOE (dyspnea on exertion)    Ongoing, affecting daily activities. Filled out handicap placard for patient.       COPD mixed type ([place])    Appreciate pulm - thought chronic bronchitis contributing to dyspnea + cardiac component. On symbicort.      Chronic cough    Some improvement since stent placements      Chronic anticoagulation    Continues xarelto for parox afib.           No orders of the defined types were placed in this encounter.  No orders of the defined types were placed in this encounter.  Patient instructions: If interested, check with pharmacy about new 2 shot shingles series (shingrix).  Handicap placard filled out today.  Good to see you  today. Return as needed or in 6 months for wellness visit.   Follow up plan: Return in about 6 months (around 07/26/2019), or if symptoms worsen or fail to improve, for medicare wellness visit.  [name], MD

## 2019-01-25 NOTE — Assessment & Plan Note (Signed)
Appreciate pulm - thought chronic bronchitis contributing to dyspnea + cardiac component. On symbicort.

## 2019-01-25 NOTE — Assessment & Plan Note (Signed)
Ongoing, affecting daily activities. Filled out handicap placard for patient.

## 2019-01-25 NOTE — Assessment & Plan Note (Signed)
Preventative protocols reviewed and updated unless pt declined. Discussed healthy diet and lifestyle.  

## 2019-01-25 NOTE — Assessment & Plan Note (Signed)
Continue statin, plavix.  

## 2019-01-25 NOTE — Assessment & Plan Note (Signed)
Continues xarelto for parox afib.

## 2019-01-25 NOTE — Assessment & Plan Note (Signed)
Ongoing, affecting daily activities. Anticipate pulm/cards contributions. He had normal TSH, B12 07/2018. He is not anemic.

## 2019-01-25 NOTE — Assessment & Plan Note (Signed)
Chronic, stable. Continue current regimen. 

## 2019-01-25 NOTE — Assessment & Plan Note (Signed)
Some improvement since stent placements

## 2019-01-25 NOTE — Assessment & Plan Note (Signed)
Improved on latest check.  °

## 2019-01-25 NOTE — Assessment & Plan Note (Signed)
Chronic, stable on lipitor and zetia.  The ASCVD Risk score Mikey Bussing DC Jr., et al., 2013) failed to calculate for the following reasons:   The 2013 ASCVD risk score is only valid for ages 56 to 30

## 2019-01-25 NOTE — Patient Instructions (Addendum)
If interested, check with pharmacy about new 2 shot shingles series (shingrix).  Handicap placard filled out today.  Good to see you today. Return as needed or in 6 months for wellness visit.   Health Maintenance After Age 82 After age 20, you are at a higher risk for certain long-term diseases and infections as well as injuries from falls. Falls are a major cause of broken bones and head injuries in people who are older than age 28. Getting regular preventive care can help to keep you healthy and well. Preventive care includes getting regular testing and making lifestyle changes as recommended by your health care provider. Talk with your health care provider about:  Which screenings and tests you should have. A screening is a test that checks for a disease when you have no symptoms.  A diet and exercise plan that is right for you. What should I know about screenings and tests to prevent falls? Screening and testing are the best ways to find a health problem early. Early diagnosis and treatment give you the best chance of managing medical conditions that are common after age 28. Certain conditions and lifestyle choices may make you more likely to have a fall. Your health care provider may recommend:  Regular vision checks. Poor vision and conditions such as cataracts can make you more likely to have a fall. If you wear glasses, make sure to get your prescription updated if your vision changes.  Medicine review. Work with your health care provider to regularly review all of the medicines you are taking, including over-the-counter medicines. Ask your health care provider about any side effects that may make you more likely to have a fall. Tell your health care provider if any medicines that you take make you feel dizzy or sleepy.  Osteoporosis screening. Osteoporosis is a condition that causes the bones to get weaker. This can make the bones weak and cause them to break more easily.  Blood pressure  screening. Blood pressure changes and medicines to control blood pressure can make you feel dizzy.  Strength and balance checks. Your health care provider may recommend certain tests to check your strength and balance while standing, walking, or changing positions.  Foot health exam. Foot pain and numbness, as well as not wearing proper footwear, can make you more likely to have a fall.  Depression screening. You may be more likely to have a fall if you have a fear of falling, feel emotionally low, or feel unable to do activities that you used to do.  Alcohol use screening. Using too much alcohol can affect your balance and may make you more likely to have a fall. What actions can I take to lower my risk of falls? General instructions  Talk with your health care provider about your risks for falling. Tell your health care provider if: ? You fall. Be sure to tell your health care provider about all falls, even ones that seem minor. ? You feel dizzy, sleepy, or off-balance.  Take over-the-counter and prescription medicines only as told by your health care provider. These include any supplements.  Eat a healthy diet and maintain a healthy weight. A healthy diet includes low-fat dairy products, low-fat (lean) meats, and fiber from whole grains, beans, and lots of fruits and vegetables. Home safety  Remove any tripping hazards, such as rugs, cords, and clutter.  Install safety equipment such as grab bars in bathrooms and safety rails on stairs.  Keep rooms and walkways well-lit. Activity  Follow a regular exercise program to stay fit. This will help you maintain your balance. Ask your health care provider what types of exercise are appropriate for you.  If you need a cane or walker, use it as recommended by your health care provider.  Wear supportive shoes that have nonskid soles. Lifestyle  Do not drink alcohol if your health care provider tells you not to drink.  If you drink  alcohol, limit how much you have: ? 0-1 drink a day for women. ? 0-2 drinks a day for men.  Be aware of how much alcohol is in your drink. In the [place] one drink equals one typical bottle of beer (12 oz), one-half glass of wine (5 oz), or one shot of hard liquor (1 oz).  Do not use any products that contain nicotine or tobacco, such as cigarettes and e-cigarettes. If you need help quitting, ask your health care provider. Summary  Having a healthy lifestyle and getting preventive care can help to protect your health and wellness after age 55.  Screening and testing are the best way to find a health problem early and help you avoid having a fall. Early diagnosis and treatment give you the best chance for managing medical conditions that are more common for people who are older than age 70.  Falls are a major cause of broken bones and head injuries in people who are older than age 66. Take precautions to prevent a fall at home.  Work with your health care provider to learn what changes you can make to improve your health and wellness and to prevent falls. This information is not intended to replace advice given to you by your health care provider. Make sure you discuss any questions you have with your health care provider. Document Released: 12/10/2016 Document Revised: 05/20/2018 Document Reviewed: 12/10/2016 Elsevier Patient Education  2020 [employer].

## 2019-01-26 ENCOUNTER — Ambulatory Visit (INDEPENDENT_AMBULATORY_CARE_PROVIDER_SITE_OTHER): Payer: Medicare Other | Admitting: Cardiology

## 2019-01-26 ENCOUNTER — Other Ambulatory Visit: Payer: Self-pay | Admitting: Cardiovascular Disease

## 2019-01-26 ENCOUNTER — Other Ambulatory Visit: Payer: Self-pay

## 2019-01-26 ENCOUNTER — Encounter: Payer: Self-pay | Admitting: Cardiology

## 2019-01-26 VITALS — BP 125/80 | HR 80 | Temp 96.1°F | Ht 71.0 in | Wt 216.0 lb

## 2019-01-26 DIAGNOSIS — I480 Paroxysmal atrial fibrillation: Secondary | ICD-10-CM

## 2019-01-26 DIAGNOSIS — I2510 Atherosclerotic heart disease of native coronary artery without angina pectoris: Secondary | ICD-10-CM | POA: Diagnosis not present

## 2019-01-26 DIAGNOSIS — J449 Chronic obstructive pulmonary disease, unspecified: Secondary | ICD-10-CM

## 2019-01-26 DIAGNOSIS — Z9861 Coronary angioplasty status: Secondary | ICD-10-CM

## 2019-01-26 DIAGNOSIS — R0609 Other forms of dyspnea: Secondary | ICD-10-CM

## 2019-01-26 DIAGNOSIS — I4510 Unspecified right bundle-branch block: Secondary | ICD-10-CM

## 2019-01-26 DIAGNOSIS — R0600 Dyspnea, unspecified: Secondary | ICD-10-CM

## 2019-01-26 DIAGNOSIS — Z7901 Long term (current) use of anticoagulants: Secondary | ICD-10-CM

## 2019-01-26 DIAGNOSIS — Z86711 Personal history of pulmonary embolism: Secondary | ICD-10-CM

## 2019-01-26 MED ORDER — METOPROLOL SUCCINATE ER 50 MG PO TB24: 150.0000 mg | ORAL_TABLET | Freq: Every day | ORAL | 2 refills | Status: DC

## 2019-01-26 MED ORDER — FUROSEMIDE 20 MG PO TABS: 20.0000 mg | ORAL_TABLET | Freq: Every day | ORAL | 3 refills | Status: DC

## 2019-01-26 MED ORDER — PANTOPRAZOLE SODIUM 40 MG PO TBEC: 40.0000 mg | DELAYED_RELEASE_TABLET | Freq: Every day | ORAL | 3 refills | Status: DC

## 2019-01-26 MED ORDER — CLOPIDOGREL BISULFATE 75 MG PO TABS: 75.0000 mg | ORAL_TABLET | Freq: Every day | ORAL | 3 refills | Status: DC

## 2019-01-26 NOTE — Assessment & Plan Note (Signed)
Jan 2019 after Xarelto held for knee surgery

## 2019-01-26 NOTE — Assessment & Plan Note (Signed)
S/p ablation 12/2015 NSR up until his PCI Dec 2020 when he went into AF post PCI

## 2019-01-26 NOTE — Assessment & Plan Note (Signed)
CHADS VASC=5 

## 2019-01-26 NOTE — Assessment & Plan Note (Addendum)
Known 70% LAD by cath 2016 and 2017with negative Myoview and negative FFR-Nov 2017 Cath 01/11/2019 for DOE-  80% pLAD treated with PCI/DES

## 2019-01-26 NOTE — Progress Notes (Signed)
Cardiology Office Note:    Date:  01/26/2019   ID:  Theodore [name], DOB Aug 28, 1934, MRN [id number]  PCP:  [name], MD  Cardiologist:  Skeet Latch, MD  Electrophysiologist:  Constance Haw, MD   Referring MD: [name], MD   No chief complaint on file. Post PCI follow up  History of Present Illness:    Theodore [name] is a 84 y.o. male with a hx of CAD.  He had a 70% LAD lesion at catheterization in 2016 and 2017.  Myoview was low risk.  He was treated medically.  He has had problems for some time now with dyspnea on exertion.  He is being followed by Dr. Annamaria Boots, the patient does have moderate COPD.  Eventually this patient's symptoms persisted despite treatment of his COPD.  He was admitted for diagnostic catheterization 01/10/2019.  This revealed high-grade proximal LAD disease.  His LV function was normal, he did have an elevated LVEDP.  On 01/11/2019 he underwent intervention to the LAD with a DES.  He had good results and there was no other significant coronary disease.    The patient has been on Xarelto for PAF and a history of pulmonary embolism in 2018 after knee surgery.  He was in normal sinus rhythm when he was admitted for his PCI in Nov 2020 but post PCI he went into atrial fibrillation again.  His beta-blocker was increased.  He is seen in the office today in follow-up.  Since discharge he says he has felt "weak".  His shortness of breath and dyspnea on exertion he feels is improved.  EKG in the office today showed that he is back in normal sinus rhythm.  Past Medical History:  Diagnosis Date  . CAP (community acquired pneumonia) 01/19/2018  . History of arthritis   . History of rheumatic fever   . Hypercholesterolemia   . Hypertension   . Nocturia   . PAF (paroxysmal atrial fibrillation) [facility]) January 2013   Placed on Pradaxa. Did not require cardioversion; spontaneously converted  . Peripheral edema   . Right bundle branch block   . SOB  (shortness of breath)   . Tendonitis of elbow, left     Past Surgical History:  Procedure Laterality Date  . ATRIAL FIBRILLATION ABLATION  12/19/2015  . BACK SURGERY  12/24/09   fusion C3-C4  . BACK SURGERY  2010   fusion L4-L5  . CARDIAC CATHETERIZATION  2009   NONOBSTRUCTIVE ATHERSCLEROTIC CORONARY DISEASE AND NORMAL  LV FUNCTION  . CARDIAC CATHETERIZATION N/A 09/08/2014   Procedure: Left Heart Cath and Coronary Angiography;  Surgeon: Belva Crome, MD;  Location: [place] CV LAB;  Service: Cardiovascular;  Laterality: N/A;  . CARDIAC CATHETERIZATION N/A 09/28/2015   Procedure: Left Heart Cath and Coronary Angiography;  Surgeon: Leonie Man, MD;  Location: [place] [facility];  Service: Cardiovascular;  Laterality: N/A;  . CARDIAC CATHETERIZATION N/A 09/28/2015   Procedure: Intravascular Pressure Wire/FFR Study;  Surgeon: Leonie Man, MD;  Location: [place] [facility];  Service: Cardiovascular;  Laterality: N/A;  . CARDIOVERSION N/A 10/23/2015   Procedure: CARDIOVERSION;  Surgeon: [name], MD;  Location: [facility] ENDOSCOPY;  Service: Cardiovascular;  Laterality: N/A;  . COLONOSCOPY  2013   per patient, rpt 5 yrs  . COLONOSCOPY WITH PROPOFOL N/A 12/02/2018   TAx2, HP, diverticulosis [name], MD)  . CORONARY ATHERECTOMY N/A 01/11/2019   Procedure: CORONARY ATHERECTOMY;  Surgeon: Burnell [name], MD;  Location: [facility]  INVASIVE CV LAB;  Service: Cardiovascular;  Laterality: N/A;  . CORONARY STENT INTERVENTION N/A 01/11/2019   Procedure: CORONARY STENT INTERVENTION;  Surgeon: Burnell [name], MD;  Location: [place] [facility] LAB;  Service: Cardiovascular;  Laterality: N/A;  . ELECTROPHYSIOLOGIC STUDY N/A 12/19/2015   Procedure: Atrial Fibrillation Ablation;  Surgeon: [name], MD;  Location: [place] [facility] LAB;  Service: Cardiovascular;  Laterality: N/A;  . ESOPHAGOGASTRODUODENOSCOPY (EGD) WITH PROPOFOL N/A 12/02/2018   medium HH [name], MD)  .  INTRAVASCULAR PRESSURE WIRE/FFR STUDY N/A 01/10/2019   Procedure: INTRAVASCULAR PRESSURE WIRE/FFR STUDY;  Surgeon: Leonie Man, MD;  Location: [place] CV LAB;  Service: Cardiovascular;  Laterality: N/A;  . KNEE ARTHROSCOPY Left 03/2016   Dr. [name]  . LEFT HEART CATH AND CORONARY ANGIOGRAPHY N/A 01/10/2019   Procedure: LEFT HEART CATH AND CORONARY ANGIOGRAPHY;  Surgeon: Leonie Man, MD;  Location: [place] [facility] LAB;  Service: Cardiovascular;  Laterality: N/A;  . PATELLAR TENDON REPAIR Left 2008  . POLYPECTOMY  12/02/2018   Procedure: POLYPECTOMY;  Surgeon: [name], MD;  Location: [facility] ENDOSCOPY;  Service: Endoscopy;;  . REPLACEMENT TOTAL KNEE Right 2006  . TOTAL KNEE ARTHROPLASTY Left 02/09/2017   Procedure: LEFT TOTAL KNEE ARTHROPLASTY, EXCISION LEFT DISTAL THIGH MASS;  Surgeon: [name], MD;  Location: [facility] ORS;  Service: Orthopedics;  Laterality: Left;  90 mins  . TRICEPS TENDON REPAIR Left 2013    Current Medications: Current Meds  Medication Sig  . acetaminophen (TYLENOL) 500 MG tablet Take 1 tablet (500 mg total) by mouth 3 (three) times daily as needed.  . Ascorbic Acid (VITAMIN C PO) Take 500 mg by mouth daily.   [name] atorvastatin (LIPITOR) 40 MG tablet TAKE 1 TABLET DAILY (Patient taking differently: Take 40 mg by mouth daily. )  . budesonide-formoterol (SYMBICORT) 160-4.5 MCG/ACT inhaler Inhale 2 puffs into the lungs 2 (two) times daily.  . Cholecalciferol (VITAMIN D3) 2000 units TABS Take 2,000 Units by mouth daily.  . clopidogrel (PLAVIX) 75 MG tablet Take 1 tablet (75 mg total) by mouth daily with breakfast.  . ezetimibe (ZETIA) 10 MG tablet TAKE 1 TABLET DAILY (Patient taking differently: Take 10 mg by mouth daily. )  . furosemide (LASIX) 20 MG tablet Take 1 tablet (20 mg total) by mouth daily.  [name] losartan (COZAAR) 100 MG tablet TAKE 1 TABLET ONCE DAILY (Patient taking differently: Take 100 mg by mouth daily. )  . Lysine 500 MG TABS Take 500 tablets by mouth  daily.   . metoprolol succinate (TOPROL-XL) 50 MG 24 hr tablet Take 3 tablets (150 mg total) by mouth daily. Take with or immediately following a meal.  . nitroGLYCERIN (NITROSTAT) 0.4 MG SL tablet Place 1 tablet (0.4 mg total) under the tongue every 5 (five) minutes as needed.  . pantoprazole (PROTONIX) 40 MG tablet Take 1 tablet (40 mg total) by mouth daily.  . potassium chloride (K-DUR,KLOR-CON) 10 MEQ tablet TAKE 1 TABLET TWICE A DAY (Patient taking differently: Take 10 mEq by mouth 2 (two) times daily. )  . Spacer/Aero-Holding Chambers (AEROCHAMBER MV) inhaler Use as instructed  . XARELTO 20 MG TABS tablet TAKE 1 TABLET DAILY WITH SUPPER (Patient taking differently: Take 20 mg by mouth at bedtime. )  . [DISCONTINUED] clopidogrel (PLAVIX) 75 MG tablet Take 1 tablet (75 mg total) by mouth daily with breakfast.  . [DISCONTINUED] furosemide (LASIX) 40 MG tablet TAKE 1 TABLET DAILY (NEW DOSE) (Patient taking differently: Take 40 mg by mouth  daily. )  . [DISCONTINUED] metoprolol succinate (TOPROL-XL) 50 MG 24 hr tablet Take 3 tablets (150 mg total) by mouth daily. Take with or immediately following a meal.  . [DISCONTINUED] pantoprazole (PROTONIX) 40 MG tablet Take 1 tablet (40 mg total) by mouth daily.     Allergies:   Penicillins, Amlodipine, Lisinopril, Mevacor [lovastatin], Vicodin [hydrocodone-acetaminophen], Zocor [simvastatin], and Oseltamivir phosphate   Social History   Socioeconomic History  . Marital status: Married    Spouse name: Not on file  . Number of children: Not on file  . Years of education: Not on file  . Highest education level: Not on file  Occupational History  . Occupation: retired    Fish farm manager: RETIRED    Comment: Art gallery manager  Tobacco Use  . Smoking status: Never Smoker  . Smokeless tobacco: Never Used  Substance and Sexual Activity  . Alcohol use: Yes    Alcohol/week: 14.0 standard drinks    Types: 14 Cans of beer per week    Comment: 2 beers daily   . Drug use: No  . Sexual activity: Not Currently  Other Topics Concern  . Not on file  Social History Narrative   Lives with wife, no pets   Retired   Occ: Art gallery manager   Edu: master's   Activity: golf   Diet: good water, fruits/vegetables daily   Social Determinants of Health   Financial Resource Strain:   . Difficulty of Paying Living Expenses: Not on file  Food Insecurity:   . Worried About Charity fundraiser in the Last Year: Not on file  . Ran Out of Food in the Last Year: Not on file  Transportation Needs:   . Lack of Transportation (Medical): Not on file  . Lack of Transportation (Non-Medical): Not on file  Physical Activity:   . Days of Exercise per Week: Not on file  . Minutes of Exercise per Session: Not on file  Stress:   . Feeling of Stress : Not on file  Social Connections:   . Frequency of Communication with Friends and Family: Not on file  . Frequency of Social Gatherings with Friends and Family: Not on file  . Attends Religious Services: Not on file  . Active Member of Clubs or Organizations: Not on file  . Attends Archivist Meetings: Not on file  . Marital Status: Not on file     Family History: The patient's family history includes Asthma in his sister; COPD in his brother and father; Heart attack in his father; Heart disease in his brother; Hypertension in his father. There is no history of Stroke.  ROS:   Please see the history of present illness.     All other systems reviewed and are negative.  EKGs/Labs/Other Studies Reviewed:    The following studies were reviewed today: Cath 01/10/2019 PCI 01/11/2019  EKG:  EKG is ordered today.  The ekg ordered today demonstrates NSR, HR 69, RBBB  Recent Labs: 03/19/2018: Pro B Natriuretic peptide (BNP) 61.0 08/10/2018: TSH 1.63 01/18/2019: ALT 20; BUN 17; Creatinine, Ser 1.13; Hemoglobin 15.5; Platelets 171.0; Potassium 4.2; Sodium 133  Recent Lipid Panel    Component Value Date/Time    CHOL 141 01/18/2019 0756   CHOL 121 07/29/2016 0822   TRIG 70.0 01/18/2019 0756   HDL 59.10 01/18/2019 0756   HDL 56 07/29/2016 0822   CHOLHDL 2 01/18/2019 0756   VLDL 14.0 01/18/2019 0756   LDLCALC 68 01/18/2019 0756   LDLCALC 56 07/29/2016 [id number]  Physical Exam:    VS:  BP 125/80   Pulse 80   Temp (!) 96.1 F (35.6 C) (Temporal)   Ht 5\' 11"  (1.803 m)   Wt 216 lb (98 kg)   SpO2 99%   BMI 30.13 kg/m     Wt Readings from Last 3 Encounters:  01/26/19 216 lb (98 kg)  01/25/19 214 lb 9 oz (97.3 kg)  01/12/19 221 lb (100.2 kg)     GEN:  Well nourished, well developed in no acute distress HEENT: Normal NECK: No JVD; No carotid bruits LYMPHATICS: No lymphadenopathy CARDIAC: RRR, no murmurs, rubs, gallops RESPIRATORY:  Clear to auscultation few rhonchi Rt base ABDOMEN: Soft, non-tender, non-distended MUSCULOSKELETAL:  No edema; No deformity- ecchymosis rt forearm post PCI SKIN: Warm and dry NEUROLOGIC:  Alert and oriented x 3 PSYCHIATRIC:  Normal affect   ASSESSMENT:    DOE (dyspnea on exertion) Admitted for diagnostic cath 01/11/2019 to r/o anginal equivalent   CAD S/P percutaneous coronary angioplasty Known 70% LAD by cath 2016 and 2017with negative Myoview and negative FFR-Nov 2017 Cath 01/11/2019 for DOE-  80% pLAD treated with PCI/DES   Paroxysmal atrial fibrillation ([place]) S/p ablation 12/2015 NSR up until his PCI Dec 2020 when he went into AF post PCI  Chronic anticoagulation CHADS VASC=5  History of pulmonary embolism Jan 2019 after Xarelto held for knee surgery  COPD mixed type ([place]) Moderate COPD- Dr Annamaria Boots follows  Right bundle branch block Chronic  PLAN:    The patient requested his Lasix be decreased secondary to frequent urination and cramping.  I suggested he could see how he does with 20 mg daily as his LVF was normal.  He knows to continue Plavix for a year.  I'll see him back in 3 months.    Medication Adjustments/Labs and Tests  Ordered: Current medicines are reviewed at length with the patient today.  Concerns regarding medicines are outlined above.  Orders Placed This Encounter  Procedures  . EKG 12-Lead   Meds ordered this encounter  Medications  . clopidogrel (PLAVIX) 75 MG tablet    Sig: Take 1 tablet (75 mg total) by mouth daily with breakfast.    Dispense:  90 tablet    Refill:  3  . pantoprazole (PROTONIX) 40 MG tablet    Sig: Take 1 tablet (40 mg total) by mouth daily.    Dispense:  90 tablet    Refill:  3  . metoprolol succinate (TOPROL-XL) 50 MG 24 hr tablet    Sig: Take 3 tablets (150 mg total) by mouth daily. Take with or immediately following a meal.    Dispense:  270 tablet    Refill:  2  . furosemide (LASIX) 20 MG tablet    Sig: Take 1 tablet (20 mg total) by mouth daily.    Dispense:  90 tablet    Refill:  3    There are no Patient Instructions on file for this visit.   Angelena Form, PA-C  01/26/2019 9:13 AM    [facility]

## 2019-01-26 NOTE — Assessment & Plan Note (Signed)
Admitted for diagnostic cath 01/11/2019 to r/o anginal equivalent

## 2019-01-26 NOTE — Patient Instructions (Signed)
Medication Instructions:  DECREASE Lasix to 20mg  Take 1 tablet once a day  *If you need a refill on your cardiac medications before your next appointment, please call your pharmacy*  Lab Work: None  If you have labs (blood work) drawn today and your tests are completely normal, you will receive your results only by: [name] MyChart Message (if you have MyChart) OR . A paper copy in the mail If you have any lab test that is abnormal or we need to change your treatment, we will call you to review the results.  Testing/Procedures: None   Follow-Up: At [facility], you and your health needs are our priority.  As part of our continuing mission to provide you with exceptional heart care, we have created designated Provider Care Teams.  These Care Teams include your primary Cardiologist (physician) and Advanced Practice Providers (APPs -  Physician Assistants and Nurse Practitioners) who all work together to provide you with the care you need, when you need it.  Your next appointment:   3 month(s)  The format for your next appointment:   Either In Person or Virtual  Provider:   Kerin Ransom, PA-C  Other Instructions

## 2019-01-26 NOTE — Assessment & Plan Note (Signed)
Chronic. 

## 2019-01-26 NOTE — Assessment & Plan Note (Signed)
Moderate COPD- Dr Annamaria Boots follows

## 2019-01-27 ENCOUNTER — Telehealth: Payer: Self-pay | Admitting: Family Medicine

## 2019-01-27 DIAGNOSIS — R040 Epistaxis: Secondary | ICD-10-CM

## 2019-01-27 NOTE — Telephone Encounter (Signed)
Patient stated that he is needing a  Referral sent over to an ENT office He has been battling a nose bleed today and it has been going on and off for 4 hours.  Patient stated he wanted to get into their office hopefully tomorrow if at all possible

## 2019-01-28 ENCOUNTER — Encounter: Payer: Self-pay | Admitting: Family Medicine

## 2019-01-28 NOTE — Telephone Encounter (Signed)
Noted thank you

## 2019-01-28 NOTE — Telephone Encounter (Signed)
Rosaria Ferries [facility] is talking with [facility] and will call pt with appt info. ED precautions given to pt and pt voiced understanding. Since !1 AM 01/27/19 pt has had nose bleed on and off; more on than off; not actively bleeding now but if touches nose he will bleed. Pt did not take Xarelto last night. Pt is aware Rosaria Ferries will call him as soon as she gets off phone with [facility].

## 2019-01-28 NOTE — Telephone Encounter (Signed)
Per [facility] referral note pt has appt with ENT Dr Blenda Nicely 01/28/19 at 9:30. FYI to Dr Darnell Level.

## 2019-01-28 NOTE — Telephone Encounter (Signed)
[place] Night - Client TELEPHONE ADVICE RECORD [facility] Patient Name: Theodore [name] Gender: Male DOB: August 02, 1934 Age: 84 Y 70 M 16 D Return Phone Number: [phone] (Primary), [phone] (Secondary) Address: [name] [address] Client [name] [place] Night - Client Client Site [place] Physician Theodore [name] - MD Contact Type Call Who Is Calling Patient / Member / Family / Caregiver Call Type Triage / Clinical Relationship To Patient Self Return Phone Number [phone] (Primary) Chief Complaint BLEEDING - Uncontrollable (not vaginal) Reason for Call Symptomatic / Request for [name] states he had a cardio procedure 2 weeks ago and his nose has been bleeding for almost 24 hours. He needs a referral to an ENT. He takes blood thinner and thinks he needs to get it cauterized. Translation No Nurse Assessment Nurse: Vallery Sa, RN, Cathy Date/Time (Eastern Time): 01/28/2019 7:57:29 AM Confirm and document reason for call. If symptomatic, describe symptoms. ---Belenda Cruise states he has had about 8-10 nosebleeds since yesterday. He is on blood thinners and requests a referral to ENT. No fever. Alert and responsive. Has the patient had close contact with a person known or suspected to have the novel coronavirus illness OR traveled / lives in area with major community spread (including international travel) in the last 14 days from the onset of symptoms? * If Asymptomatic, screen for exposure and travel within the last 14 days. ---No Does the patient have any new or worsening symptoms? ---Yes Will a triage be completed? ---Yes Related visit to physician within the last 2 weeks? ---Yes Does the PT have any chronic conditions? (i.e. diabetes, asthma, this includes High risk factors for pregnancy, etc.) ---Yes List chronic conditions. ---Cardiac  procedure to have cardiac stents two weeks ago and on blood thinners, COPD Is this a behavioral health or substance abuse call? ---No Guidelines Guideline Title Affirmed Question Affirmed Notes Nurse Date/Time (Eastern Time) Nosebleed [1] Skin bruises or bleeding gums AND [2] not caused by an injury Trumbull, RN, Cathy 01/28/2019 7:59:51 AM PLEASE NOTE: All timestamps contained within this report are represented as [place] Standard Time. CONFIDENTIALTY NOTICE: This fax transmission is intended only for the addressee. It contains information that is legally privileged, confidential or otherwise protected from use or disclosure. If you are not the intended recipient, you are strictly prohibited from reviewing, disclosing, copying using or disseminating any of this information or taking any action in reliance on or regarding this information. If you have received this fax in error, please notify us immediately by telephone so that we can arrange for its return to [place]. Phone: [phone], Toll-Free: [phone], Fax: [phone] Page: 2 of 2 Call Id: [id number] [place]. Time Eilene Ghazi Time) Disposition Final User 01/28/2019 7:55:26 AM Send to Urgent Sallyanne Havers, [place] 01/28/2019 8:02:20 AM See PCP within 24 Hours Yes Trumbull, RN, T[place] Caller Disagree/Comply Comply Caller Understands Yes PreDisposition Call Doctor Care Advice Given Per Guideline SEE PCP WITHIN 24 HOURS: * IF OFFICE WILL BE OPEN: You need to be seen within the next 24 hours. Call your doctor (or NP/PA) when the office opens and make an appointment. TREATING A NOSEBLEED - [place] THE NOSTRILS: * First blow the nose to clear out any large clots. * Sit down and lean forward. (Reason: blood makes people choke if they lean backwards). * Gently squeeze the soft parts of the lower nose (nostrils) together. Use your thumb and your index finger in a pinching manner. Do this  for 15 minutes. Use a clock or watch to measure the time.  (Goal: apply continuous pressure to the bleeding point.) * If the bleeding continues after 15 minutes of squeezing, move your point of pressure and repeat again for another 15 minutes. CALL BACK IF: * Nosebleeding lasts longer than 30 minutes with using direct pressure * Lightheadedness or weakness occurs * Nosebleeds become worse * You become worse. CARE ADVICE given per Nosebleed (Adult) guideline. Comments User: Berton Mount, RN Date/Time Eilene Ghazi Time): 01/28/2019 8:06:29 AM Transferred to [facility] via the office backline for further assistance. Referrals REFERRED TO PCP OFFICE

## 2019-01-28 NOTE — Telephone Encounter (Signed)
I was not in office yesterday. Just getting message today rec eval asap - placed ENT eval. If unable to see today, rec UCC or ER.

## 2019-01-31 NOTE — Telephone Encounter (Signed)
lmtcb

## 2019-01-31 NOTE — Telephone Encounter (Signed)
Pt reports being seen by ENT emergently end of last week d/t length of nose bleed. The pt reports that the ENT stated there is a "breakdown in the skin", so pt is not sure this is anticoagulated related.   I go back to see the ENT in 3 weeks and would like to wait and see before switching to a different blood thinner. Pt aware to call back if reoccurs or if need to switch to a different medication. Dr. [name] aware and agreeable to plan.

## 2019-02-08 ENCOUNTER — Other Ambulatory Visit: Payer: Self-pay | Admitting: *Deleted

## 2019-02-10 ENCOUNTER — Telehealth (HOSPITAL_COMMUNITY): Payer: Self-pay | Admitting: *Deleted

## 2019-02-10 ENCOUNTER — Other Ambulatory Visit: Payer: Self-pay

## 2019-02-10 MED ORDER — ATORVASTATIN CALCIUM 40 MG PO TABS: 40.0000 mg | ORAL_TABLET | Freq: Every day | ORAL | 3 refills | Status: DC

## 2019-02-10 NOTE — Telephone Encounter (Signed)
-----   Message from Skeet Latch, MD sent at 02/10/2019  7:51 AM EST ----- Regarding: RE: Theodore [name] to complete initial assessment onsite for cardiac rehab Hi Theodore [name]!  He is OK to come for the initial assessment.  He needs to wear a mask while exercising.  Then the virtual program would be better.  Thank you, my Christmas was great.  I hope yours was too!  ~TCR ----- Message ----- From: Rowe Pavy, RN Sent: 02/07/2019   3:35 PM EST To: Skeet Latch, MD Subject: Ok to complete initial assessment onsite for#  Dr. [name],  I hope that you had a wonderful Christmas with your family!!!   Your patient Theodore [name] 01/08/1935 s/p 01/11/19 DES LAD expressed interest in participating in facility cardiac rehab. Patient has a High COVID-19 risk score of 9. Do you feel this patient is appropriate exercise in person cardiac rehab? Any additional restrictions you feel are appropriate specifically due to Covid Risk for this patient?   Pt seen in follow up by Kerin Ransom PA on 12/16     As you may already know, senior leadership has asked [place] to halt onsite Cardiac Rehab and transition all of our current and new patients to virtual.  This will allow staff to deploy to inpatient units in need due to the surge of covid patients within the health system. All patients will come in for an initial assessment which will include 6 minute walk test and Nutrition consult.  We will interact with pt either via app and/or telephone visits.  Anticipate this will be for 30-60 days.  Pt seen in follow up by Kerin Ransom PA on 12/16   Thank you and we appreciate your input  Maurice Small RN, BSN Cardiac and Pulmonary Rehab Nurse Navigator  Cardiac Rehab Staff

## 2019-02-14 ENCOUNTER — Encounter: Payer: Self-pay | Admitting: Family Medicine

## 2019-02-14 ENCOUNTER — Ambulatory Visit (INDEPENDENT_AMBULATORY_CARE_PROVIDER_SITE_OTHER): Payer: Medicare Other | Admitting: Family Medicine

## 2019-02-14 ENCOUNTER — Other Ambulatory Visit: Payer: Self-pay

## 2019-02-14 DIAGNOSIS — R52 Pain, unspecified: Secondary | ICD-10-CM | POA: Insufficient documentation

## 2019-02-14 NOTE — Assessment & Plan Note (Addendum)
Associated with sore throat that las largely resolved, anticipate viral URI related. Recommend supportive care for now. Update if worsening.  Not consistent with COVID illness. Patient worried about plavix reaction - doubt this. Discussed if persistent, would need to go through cardiology to discuss possible alternatives.

## 2019-02-14 NOTE — Progress Notes (Signed)
Virtual visit completed through [employer]. Due to national recommendations of social distancing due to COVID-19, a virtual visit is felt to be most appropriate for this patient at this time. Reviewed limitations of a virtual visit.   Patient location: home Provider location: [place] at [facility], office If any vitals were documented, they were collected by patient at home unless specified below.    BP 120/70   Ht 5\' 11"  (1.803 m)   Wt 218 lb (98.9 kg)   BMI 30.40 kg/m    CC: viral URI sxs Subjective:    Patient ID: Theodore [name], male    DOB: Oct 31, 1934, 85 y.o.   MRN: [id number]  HPI: Theodore [name] is a 85 y.o. male presenting on 02/14/2019 for Cough, Headache, and Sore Throat   1 wk h/o ST with pain to swallow, body aches (shoulders, hands, shoulderblade), swollen hands. Sore throat has largely resolved.   Today feeling better but persistent malaise.  Has recently started plavix. Worried symptoms are related to medication.   Never fevers/chills, no headache, cough, dyspnea, loss of taste/smell, abd pain, nausea, diarrhea.  No know Covid-19 exposure.  He took some amoxicillin pills he had on hand.   No hip or lower back pain.       Relevant past medical, surgical, family and social history reviewed and updated as indicated. Interim medical history since our last visit reviewed. Allergies and medications reviewed and updated. Outpatient Medications Prior to Visit  Medication Sig Dispense Refill  . acetaminophen (TYLENOL) 500 MG tablet Take 1 tablet (500 mg total) by mouth 3 (three) times daily as needed. 30 tablet 0  . Ascorbic Acid (VITAMIN C PO) Take 500 mg by mouth daily.     [name] atorvastatin (LIPITOR) 40 MG tablet Take 1 tablet (40 mg total) by mouth daily. 90 tablet 3  . budesonide-formoterol (SYMBICORT) 160-4.5 MCG/ACT inhaler Inhale 2 puffs into the lungs 2 (two) times daily. 3 Inhaler 1  . Cholecalciferol (VITAMIN D3) 2000 units TABS Take 2,000 Units by mouth  daily.    . clopidogrel (PLAVIX) 75 MG tablet Take 1 tablet (75 mg total) by mouth daily with breakfast. 90 tablet 3  . ezetimibe (ZETIA) 10 MG tablet TAKE 1 TABLET DAILY (Patient taking differently: Take 10 mg by mouth daily. ) 90 tablet 3  . furosemide (LASIX) 20 MG tablet Take 1 tablet (20 mg total) by mouth daily. 90 tablet 3  . losartan (COZAAR) 100 MG tablet TAKE 1 TABLET ONCE DAILY (Patient taking differently: Take 100 mg by mouth daily. ) 90 tablet 3  . Lysine 500 MG TABS Take 500 tablets by mouth daily.     . metoprolol succinate (TOPROL-XL) 50 MG 24 hr tablet Take 3 tablets (150 mg total) by mouth daily. Take with or immediately following a meal. 270 tablet 2  . nitroGLYCERIN (NITROSTAT) 0.4 MG SL tablet Place 1 tablet (0.4 mg total) under the tongue every 5 (five) minutes as needed. 25 tablet 2  . pantoprazole (PROTONIX) 40 MG tablet Take 1 tablet (40 mg total) by mouth daily. 90 tablet 3  . potassium chloride (KLOR-CON) 10 MEQ tablet TAKE 1 TABLET TWICE A DAY 180 tablet 3  . Spacer/Aero-Holding Chambers (AEROCHAMBER MV) inhaler Use as instructed 1 each 0  . XARELTO 20 MG TABS tablet TAKE 1 TABLET DAILY WITH SUPPER (Patient taking differently: Take 20 mg by mouth at bedtime. ) 90 tablet 1   No facility-administered medications prior to visit.  Per HPI unless specifically indicated in ROS section below Review of Systems Objective:    BP 120/70   Ht 5\' 11"  (1.803 m)   Wt 218 lb (98.9 kg)   BMI 30.40 kg/m   Wt Readings from Last 3 Encounters:  02/14/19 218 lb (98.9 kg)  01/26/19 216 lb (98 kg)  01/25/19 214 lb 9 oz (97.3 kg)     Physical exam: Gen: alert, NAD, not ill appearing Pulm: speaks in complete sentences without increased work of breathing Psych: normal mood, normal thought content      Assessment & Plan:   Problem List Items Addressed This Visit    Body aches    Associated with sore throat that las largely resolved, anticipate viral URI related. Recommend  supportive care for now. Update if worsening.  Not consistent with COVID illness. Patient worried about plavix reaction - doubt this. Discussed if persistent, would need to go through cardiology to discuss possible alternatives.           No orders of the defined types were placed in this encounter.  No orders of the defined types were placed in this encounter.   I discussed the assessment and treatment plan with the patient. The patient was provided an opportunity to ask questions and all were answered. The patient agreed with the plan and demonstrated an understanding of the instructions. The patient was advised to call back or seek an in-person evaluation if the symptoms worsen or if the condition fails to improve as anticipated.  Follow up plan: Return if symptoms worsen or fail to improve.  Theodore Bush, MD

## 2019-02-16 ENCOUNTER — Other Ambulatory Visit: Payer: Self-pay

## 2019-02-16 ENCOUNTER — Other Ambulatory Visit: Payer: Self-pay | Admitting: Internal Medicine

## 2019-02-16 MED ORDER — BUDESONIDE-FORMOTEROL FUMARATE 160-4.5 MCG/ACT IN AERO: 2.0000 | INHALATION_SPRAY | Freq: Two times a day (BID) | RESPIRATORY_TRACT | 3 refills | Status: DC

## 2019-02-16 MED ORDER — RIVAROXABAN 20 MG PO TABS: ORAL_TABLET | ORAL | 1 refills | Status: DC

## 2019-02-17 ENCOUNTER — Other Ambulatory Visit: Payer: Self-pay | Admitting: *Deleted

## 2019-02-17 MED ORDER — LOSARTAN POTASSIUM 100 MG PO TABS: ORAL_TABLET | ORAL | 3 refills | Status: DC

## 2019-02-27 ENCOUNTER — Ambulatory Visit: Payer: Medicare Other | Attending: Internal Medicine

## 2019-02-27 DIAGNOSIS — Z23 Encounter for immunization: Secondary | ICD-10-CM | POA: Insufficient documentation

## 2019-02-27 NOTE — Progress Notes (Signed)
   Covid-19 Vaccination Clinic  Name:  Theodore [name]    MRN: [id number] DOB: 12-30-1934  02/27/2019  [name] was observed post Covid-19 immunization for 30 minutes without incidence. He was provided with Vaccine Information Sheet and instruction to access the V-Safe system.   [name] was instructed to call 911 with any severe reactions post vaccine: [name] Difficulty breathing  . Swelling of your face and throat  . A fast heartbeat  . A bad rash all over your body  . Dizziness and weakness

## 2019-03-07 ENCOUNTER — Telehealth: Payer: Self-pay | Admitting: Cardiovascular Disease

## 2019-03-07 NOTE — Telephone Encounter (Signed)
Patient moved to in office visit with Keenan Bachelor NP, Dr [name] has virtual clinic only 1/28

## 2019-03-07 NOTE — Telephone Encounter (Signed)
Returned call to pt he states that for about the last about 3 weeks that he has been experiencing when lying down, the soles and toes of feet turn red and have a burning, numbness, tingling and pain sensation. After around an hour, he will have to get up due to get up due to pain. Only happens when lying down, standing up and moving around its fine. It's an ongoing thing. He states that he will sit in a chair for about an hour and it will go away and then he will go back to bed and it will return when lying down. I asked if he told his PCP he adamantly says kn this is a "heart thing" and has had heart issues his entire life and he knows when it is his heart. He states that his BP is "fine" 130's/80's with HR 65 O2 95%-97%. This happens bilateral feet. He is getting no sleep and is exhausted and need to get some sleep. I scheduled an appt for review. Anything before then to advise?

## 2019-03-07 NOTE — Telephone Encounter (Signed)
Pt called and stated that when lying down, the soles and toes of feet turn red and have a burning sensation. After around an hour, have to get up due to pain. Only happens when lying down, standing up and moving around its fine. It's an ongoing thing  Please call to discuss

## 2019-03-08 NOTE — Telephone Encounter (Signed)
Advised patient, verbalized understanding  

## 2019-03-08 NOTE — Telephone Encounter (Signed)
Cardiovascular symptoms don't occur when lying down.  Numbness, burning, tingling pain is typically neuropathic pain.  Vascular symptoms due to blockage would present when walking, b/c that is when the legs ask for more blood flow and can't get it.  He is welcome to keep the appointment, but this really should be addressed with his PCP.

## 2019-03-09 ENCOUNTER — Ambulatory Visit (INDEPENDENT_AMBULATORY_CARE_PROVIDER_SITE_OTHER): Payer: Medicare Other | Admitting: Family Medicine

## 2019-03-09 ENCOUNTER — Other Ambulatory Visit: Payer: Self-pay

## 2019-03-09 ENCOUNTER — Encounter: Payer: Self-pay | Admitting: Family Medicine

## 2019-03-09 VITALS — BP 132/74 | HR 67 | Temp 97.9°F | Ht 71.0 in | Wt 216.2 lb

## 2019-03-09 DIAGNOSIS — R739 Hyperglycemia, unspecified: Secondary | ICD-10-CM

## 2019-03-09 DIAGNOSIS — R208 Other disturbances of skin sensation: Secondary | ICD-10-CM

## 2019-03-09 LAB — CBC WITH DIFFERENTIAL/PLATELET
Basophils Absolute: 0 10*3/uL (ref 0.0–0.1)
Basophils Relative: 0.2 % (ref 0.0–3.0)
Eosinophils Absolute: 0.1 10*3/uL (ref 0.0–0.7)
Eosinophils Relative: 1 % (ref 0.0–5.0)
HCT: 42 % (ref 39.0–52.0)
Hemoglobin: 14 g/dL (ref 13.0–17.0)
Lymphocytes Relative: 27.8 % (ref 12.0–46.0)
Lymphs Abs: 2.2 10*3/uL (ref 0.7–4.0)
MCHC: 33.3 g/dL (ref 30.0–36.0)
MCV: 95 fl (ref 78.0–100.0)
Monocytes Absolute: 0.7 10*3/uL (ref 0.1–1.0)
Monocytes Relative: 8.5 % (ref 3.0–12.0)
Neutro Abs: 5 10*3/uL (ref 1.4–7.7)
Neutrophils Relative %: 62.5 % (ref 43.0–77.0)
Platelets: 168 10*3/uL (ref 150.0–400.0)
RBC: 4.41 Mil/uL (ref 4.22–5.81)
RDW: 14.1 % (ref 11.5–15.5)
WBC: 8.1 10*3/uL (ref 4.0–10.5)

## 2019-03-09 LAB — HEMOGLOBIN A1C: Hgb A1c MFr Bld: 5.9 % (ref 4.6–6.5)

## 2019-03-09 LAB — FOLATE: Folate: 11.5 ng/mL (ref >5.9)

## 2019-03-09 LAB — TSH: TSH: 1.76 u[IU]/mL (ref 0.35–4.50)

## 2019-03-09 LAB — VITAMIN B12: Vitamin B-12: 689 pg/mL (ref 211–911)

## 2019-03-09 MED ORDER — VITAMIN B-12 1000 MCG PO TABS: 1000.0000 ug | ORAL_TABLET | Freq: Every day | ORAL | Status: DC

## 2019-03-09 MED ORDER — GABAPENTIN 100 MG PO CAPS: 100.0000 mg | ORAL_CAPSULE | Freq: Every day | ORAL | 3 refills | Status: DC

## 2019-03-09 NOTE — Assessment & Plan Note (Signed)
Symptoms most consistent with bilateral peripheral neuropathy, worse since starting plavix/protonix. Will check labwork (folate, B12, cbc and thyroid) for further evaluation. Rx gabapentin at night. Consider NCS pending results/response. Pt agrees with plan.

## 2019-03-09 NOTE — Progress Notes (Signed)
This visit was conducted in person.  BP 132/74 (BP Location: Left Arm, Patient Position: Sitting, Cuff Size: Normal)   Pulse 67   Temp 97.9 F (36.6 C) (Temporal)   Ht 5\' 11"  (1.803 m)   Wt 216 lb 4 oz (98.1 kg)   SpO2 95%   BMI 30.16 kg/m    CC: foot pain Subjective:    Patient ID: Theodore [name], male    DOB: December 08, 1934, 85 y.o.   MRN: [id number]  HPI: Theodore [name] is a 85 y.o. male presenting on 03/09/2019 for Foot Pain (C/o bilateral foot pain only when lying down and redness.  Started 2-3 wks ago.  Has OV with cards 03/11/19. )   Upcoming cards appt on Friday.   3 wk h/o bilateral burning foot pain and redness at soles developing 2 hours after falling asleep that progresses to toes - very uncomfortable, wakes him up and he has to go sit in his chair. No paresthesias or numbness. This happens every night and is very bothersome, affecting sleep.   No trouble with standing or sitting or ambulation.  No calf pain with ambulation.  No back pain, shooting pain down legs.   Recent DES stent x2 to LAD 01/11/2019.  On protonix since 01/11/2019. Prior on prilosec OTC 20mg  daily for 10 + yrs.  Drinking 2 beers/day  No DM history He states he's been taking vitamin B12 daily longterm.      Relevant past medical, surgical, family and social history reviewed and updated as indicated. Interim medical history since our last visit reviewed. Allergies and medications reviewed and updated. Outpatient Medications Prior to Visit  Medication Sig Dispense Refill  . acetaminophen (TYLENOL) 500 MG tablet Take 1 tablet (500 mg total) by mouth 3 (three) times daily as needed. 30 tablet 0  . Ascorbic Acid (VITAMIN C PO) Take 500 mg by mouth daily.     [name] atorvastatin (LIPITOR) 40 MG tablet Take 1 tablet (40 mg total) by mouth daily. 90 tablet 3  . budesonide-formoterol (SYMBICORT) 160-4.5 MCG/ACT inhaler Inhale 2 puffs into the lungs 2 (two) times daily. 3 Inhaler 3  . Cholecalciferol (VITAMIN  D3) 2000 units TABS Take 2,000 Units by mouth daily.    . clopidogrel (PLAVIX) 75 MG tablet Take 1 tablet (75 mg total) by mouth daily with breakfast. 90 tablet 3  . ezetimibe (ZETIA) 10 MG tablet TAKE 1 TABLET DAILY (Patient taking differently: Take 10 mg by mouth daily. ) 90 tablet 3  . furosemide (LASIX) 20 MG tablet Take 1 tablet (20 mg total) by mouth daily. 90 tablet 3  . losartan (COZAAR) 100 MG tablet TAKE 1 TABLET ONCE DAILY 90 tablet 3  . metoprolol succinate (TOPROL-XL) 50 MG 24 hr tablet Take 3 tablets (150 mg total) by mouth daily. Take with or immediately following a meal. 270 tablet 2  . nitroGLYCERIN (NITROSTAT) 0.4 MG SL tablet Place 1 tablet (0.4 mg total) under the tongue every 5 (five) minutes as needed. 25 tablet 2  . pantoprazole (PROTONIX) 40 MG tablet Take 1 tablet (40 mg total) by mouth daily. 90 tablet 3  . potassium chloride (KLOR-CON) 10 MEQ tablet TAKE 1 TABLET TWICE A DAY 180 tablet 3  . rivaroxaban (XARELTO) 20 MG TABS tablet TAKE 1 TABLET DAILY WITH SUPPER 90 tablet 1  . Spacer/Aero-Holding Chambers (AEROCHAMBER MV) inhaler Use as instructed 1 each 0  . Lysine 500 MG TABS Take 500 tablets by mouth daily.  No facility-administered medications prior to visit.     Per HPI unless specifically indicated in ROS section below Review of Systems Objective:    BP 132/74 (BP Location: Left Arm, Patient Position: Sitting, Cuff Size: Normal)   Pulse 67   Temp 97.9 F (36.6 C) (Temporal)   Ht 5\' 11"  (1.803 m)   Wt 216 lb 4 oz (98.1 kg)   SpO2 95%   BMI 30.16 kg/m   Wt Readings from Last 3 Encounters:  03/09/19 216 lb 4 oz (98.1 kg)  02/14/19 218 lb (98.9 kg)  01/26/19 216 lb (98 kg)    Physical Exam Vitals and nursing note reviewed.  Constitutional:      Appearance: Normal appearance.  Musculoskeletal:        General: No tenderness. Normal range of motion.     Right lower leg: No edema.     Left lower leg: No edema.     Comments:  1+ DP/PT  bilaterally   Skin:    General: Skin is warm.     Capillary Refill: Capillary refill takes more than 3 seconds.     Findings: No rash.     Comments:  Dry skin to bilateral soles Purplish hue to bilateral feet distal to midfoot  Neurological:     Mental Status: He is alert.       Results for orders placed or performed in visit on 01/18/19  Comprehensive metabolic panel  Result Value Ref Range   Sodium 133 (L) 135 - 145 mEq/L   Potassium 4.2 3.5 - 5.1 mEq/L   Chloride 96 96 - 112 mEq/L   CO2 31 19 - 32 mEq/L   Glucose, Bld 102 (H) 70 - 99 mg/dL   BUN 17 6 - 23 mg/dL   Creatinine, Ser 1.13 0.40 - 1.50 mg/dL   Total Bilirubin 1.0 0.2 - 1.2 mg/dL   Alkaline Phosphatase 66 39 - 117 U/L   AST 19 0 - 37 U/L   ALT 20 0 - 53 U/L   Total Protein 6.9 6.0 - 8.3 g/dL   Albumin 4.2 3.5 - 5.2 g/dL   GFR 62.07 >60.00 mL/min   Calcium 9.6 8.4 - 10.5 mg/dL  Lipid panel  Result Value Ref Range   Cholesterol 141 0 - 200 mg/dL   Triglycerides 70.0 0.0 - 149.0 mg/dL   HDL 59.10 >39.00 mg/dL   VLDL 14.0 0.0 - 40.0 mg/dL   LDL Cholesterol 68 0 - 99 mg/dL   Total CHOL/HDL Ratio 2    NonHDL 81.59   CBC with Differential/Platelet  Result Value Ref Range   WBC 6.5 4.0 - 10.5 K/uL   RBC 4.66 4.22 - 5.81 Mil/uL   Hemoglobin 15.5 13.0 - 17.0 g/dL   HCT 45.5 39.0 - 52.0 %   MCV 97.7 78.0 - 100.0 fl   MCHC 34.1 30.0 - 36.0 g/dL   RDW 13.7 11.5 - 15.5 %   Platelets 171.0 150.0 - 400.0 K/uL   Neutrophils Relative % 57.0 43.0 - 77.0 %   Lymphocytes Relative 32.8 12.0 - 46.0 %   Monocytes Relative 9.3 3.0 - 12.0 %   Eosinophils Relative 0.6 0.0 - 5.0 %   Basophils Relative 0.3 0.0 - 3.0 %   Neutro Abs 3.7 1.4 - 7.7 K/uL   Lymphs Abs 2.1 0.7 - 4.0 K/uL   Monocytes Absolute 0.6 0.1 - 1.0 K/uL   Eosinophils Absolute 0.0 0.0 - 0.7 K/uL   Basophils Absolute 0.0 0.0 - 0.1 K/uL  Assessment & Plan:  This visit occurred during the SARS-CoV-2 public health emergency.  Safety protocols were in place,  including screening questions prior to the visit, additional usage of staff PPE, and extensive cleaning of exam room while observing appropriate contact time as indicated for disinfecting solutions.   Problem List Items Addressed This Visit    Burning sensation of foot - Primary    Symptoms most consistent with bilateral peripheral neuropathy, worse since starting plavix/protonix. Will check labwork (folate, B12, cbc and thyroid) for further evaluation. Rx gabapentin at night. Consider NCS pending results/response. Pt agrees with plan.       Relevant Orders   Vitamin B12   Folate   TSH   Hemoglobin A1c   CBC with Differential/Platelet    Other Visit Diagnoses    Hyperglycemia       Relevant Orders   Hemoglobin A1c       Meds ordered this encounter  Medications  . vitamin B-12 (CYANOCOBALAMIN) 1000 MCG tablet    Sig: Take 1 tablet (1,000 mcg total) by mouth daily.  [name] gabapentin (NEURONTIN) 100 MG capsule    Sig: Take 1 capsule (100 mg total) by mouth at bedtime. May increase to 200mg  after 3 days then again 300mg  if lower dose ineffective    Dispense:  90 capsule    Refill:  3   Orders Placed This Encounter  Procedures  . Vitamin B12  . Folate  . TSH  . Hemoglobin A1c  . CBC with Differential/Platelet    Patient instructions: I think pain is coming from neuropathy or nerve irritation.  We will check further labwork today.  Start gabapentin 100mg  nightly for nerve pain, may increase after 3 days to 200mg  and then 300mg  if not effective.  Back down on alcohol.   Follow up plan: No follow-ups on file.  [name], MD

## 2019-03-09 NOTE — Patient Instructions (Addendum)
I think pain is coming from neuropathy or nerve irritation.  We will check further labwork today.  Start gabapentin 100mg  nightly for nerve pain, may increase after 3 days to 200mg  and then 300mg  if not effective.  Back down on alcohol.   Neuropathic Pain Neuropathic pain is pain caused by damage to the nerves that are responsible for certain sensations in your body (sensory nerves). The pain can be caused by:  Damage to the sensory nerves that send signals to your spinal cord and brain (peripheral nervous system).  Damage to the sensory nerves in your brain or spinal cord (central nervous system). Neuropathic pain can make you more sensitive to pain. Even a minor sensation can feel very painful. This is usually a long-term condition that can be difficult to treat. The type of pain differs from person to person. It may:  Start suddenly (acute), or it may develop slowly and last for a long time (chronic).  Come and go as damaged nerves heal, or it may stay at the same level for years.  Cause emotional distress, loss of sleep, and a lower quality of life. What are the causes? The most common cause of this condition is diabetes. Many other diseases and conditions can also cause neuropathic pain. Causes of neuropathic pain can be classified as:  Toxic. This is caused by medicines and chemicals. The most common cause of toxic neuropathic pain is damage from cancer treatments (chemotherapy).  Metabolic. This can be caused by: ? Diabetes. This is the most common disease that damages the nerves. ? Lack of vitamin B from long-term alcohol abuse.  Traumatic. Any injury that cuts, crushes, or stretches a nerve can cause damage and pain. A common example is feeling pain after losing an arm or leg (phantom limb pain).  Compression-related. If a sensory nerve gets trapped or compressed for a long period of time, the blood supply to the nerve can be cut off.  Vascular. Many blood vessel diseases can  cause neuropathic pain by decreasing blood supply and oxygen to nerves.  Autoimmune. This type of pain results from diseases in which the body's defense system (immune system) mistakenly attacks sensory nerves. Examples of autoimmune diseases that can cause neuropathic pain include lupus and multiple sclerosis.  Infectious. Many types of viral infections can damage sensory nerves and cause pain. Shingles infection is a common cause of this type of pain.  Inherited. Neuropathic pain can be a symptom of many diseases that are passed down through families (genetic). What increases the risk? You are more likely to develop this condition if:  You have diabetes.  You smoke.  You drink too much alcohol.  You are taking certain medicines, including medicines that kill cancer cells (chemotherapy) or that treat immune system disorders. What are the signs or symptoms? The main symptom is pain. Neuropathic pain is often described as:  Burning.  Shock-like.  Stinging.  Hot or cold.  Itching. How is this diagnosed? No single test can diagnose neuropathic pain. It is diagnosed based on:  Physical exam and your symptoms. Your health care provider will ask you about your pain. You may be asked to use a pain scale to describe how bad your pain is.  Tests. These may be done to see if you have a high sensitivity to pain and to help find the cause and location of any sensory nerve damage. They include: ? Nerve conduction studies to test how well nerve signals travel through your sensory nerves (electrodiagnostic testing). ?  Stimulating your sensory nerves through electrodes on your skin and measuring the response in your spinal cord and brain (somatosensory evoked potential).  Imaging studies, such as: ? X-rays. ? CT scan. ? MRI. How is this treated? Treatment for neuropathic pain may change over time. You may need to try different treatment options or a combination of treatments. Some options  include:  Treating the underlying cause of the neuropathy, such as diabetes, kidney disease, or vitamin deficiencies.  Stopping medicines that can cause neuropathy, such as chemotherapy.  Medicine to relieve pain. Medicines may include: ? Prescription or over-the-counter pain medicine. ? Anti-seizure medicine. ? Antidepressant medicines. ? Pain-relieving patches that are applied to painful areas of skin. ? A medicine to numb the area (local anesthetic), which can be injected as a nerve block.  Transcutaneous nerve stimulation. This uses electrical currents to block painful nerve signals. The treatment is painless.  Alternative treatments, such as: ? Acupuncture. ? Meditation. ? Massage. ? Physical therapy. ? Pain management programs. ? Counseling. Follow these instructions at home: Medicines   Take over-the-counter and prescription medicines only as told by your health care provider.  Do not drive or use heavy machinery while taking prescription pain medicine.  If you are taking prescription pain medicine, take actions to prevent or treat constipation. Your health care provider may recommend that you: ? Drink enough fluid to keep your urine pale yellow. ? Eat foods that are high in fiber, such as fresh fruits and vegetables, whole grains, and beans. ? Limit foods that are high in fat and processed sugars, such as fried or sweet foods. ? Take an over-the-counter or prescription medicine for constipation. Lifestyle   Have a good support system at home.  Consider joining a chronic pain support group.  Do not use any products that contain nicotine or tobacco, such as cigarettes and e-cigarettes. If you need help quitting, ask your health care provider.  Do not drink alcohol. General instructions  Learn as much as you can about your condition.  Work closely with all your health care providers to find the treatment plan that works best for you.  Ask your health care  provider what activities are safe for you.  Keep all follow-up visits as told by your health care provider. This is important. Contact a health care provider if:  Your pain treatments are not working.  You are having side effects from your medicines.  You are struggling with tiredness (fatigue), mood changes, depression, or anxiety. Summary  Neuropathic pain is pain caused by damage to the nerves that are responsible for certain sensations in your body (sensory nerves).  Neuropathic pain may come and go as damaged nerves heal, or it may stay at the same level for years.  Neuropathic pain is usually a long-term condition that can be difficult to treat. Consider joining a chronic pain support group. This information is not intended to replace advice given to you by your health care provider. Make sure you discuss any questions you have with your health care provider. Document Revised: 05/20/2018 Document Reviewed: 02/13/2017 Elsevier Patient Education  [place].

## 2019-03-10 ENCOUNTER — Ambulatory Visit: Payer: Medicare Other | Admitting: Cardiovascular Disease

## 2019-03-10 MED ORDER — TICAGRELOR 90 MG PO TABS: 90.0000 mg | ORAL_TABLET | Freq: Two times a day (BID) | ORAL | 1 refills | Status: DC

## 2019-03-10 NOTE — Progress Notes (Signed)
Cardiology Clinic Note   Patient Name: Theodore [name] Date of Encounter: 03/11/2019  Primary Care Provider:  [name], MD Primary Cardiologist:  Skeet Latch, MD  Patient Profile    Theodore [name] 85 year old male presents today for an evaluation of his lower extremity edema.  Past Medical History    Past Medical History:  Diagnosis Date  . CAP (community acquired pneumonia) 01/19/2018  . History of arthritis   . History of rheumatic fever   . Hypercholesterolemia   . Hypertension   . Nocturia   . PAF (paroxysmal atrial fibrillation) [facility]) January 2013   Placed on Pradaxa. Did not require cardioversion; spontaneously converted  . Peripheral edema   . Right bundle branch block   . SOB (shortness of breath)   . Tendonitis of elbow, left    Past Surgical History:  Procedure Laterality Date  . ATRIAL FIBRILLATION ABLATION  12/19/2015  . BACK SURGERY  12/24/09   fusion C3-C4  . BACK SURGERY  2010   fusion L4-L5  . CARDIAC CATHETERIZATION  2009   NONOBSTRUCTIVE ATHERSCLEROTIC CORONARY DISEASE AND NORMAL  LV FUNCTION  . CARDIAC CATHETERIZATION N/A 09/08/2014   Procedure: Left Heart Cath and Coronary Angiography;  Surgeon: Belva Crome, MD;  Location: [place] [facility];  Service: Cardiovascular;  Laterality: N/A;  . CARDIAC CATHETERIZATION N/A 09/28/2015   Procedure: Left Heart Cath and Coronary Angiography;  Surgeon: Leonie Man, MD;  Location: [place] [facility];  Service: Cardiovascular;  Laterality: N/A;  . CARDIAC CATHETERIZATION N/A 09/28/2015   Procedure: Intravascular Pressure Wire/FFR Study;  Surgeon: Leonie Man, MD;  Location: [place] [facility];  Service: Cardiovascular;  Laterality: N/A;  . CARDIOVERSION N/A 10/23/2015   Procedure: CARDIOVERSION;  Surgeon: [name], MD;  Location: [facility] END[facility];  Service: Cardiovascular;  Laterality: N/A;  . COLONOSCOPY  2013   per patient, rpt 5 yrs  . COLONOSCOPY WITH PROPOFOL N/A 12/02/2018   TAx2, HP, diverticulosis [name], MD)  . CORONARY ATHERECTOMY N/A 01/11/2019   Procedure: CORONARY ATHERECTOMY;  Surgeon: Burnell [name], MD;  Location: [place] [facility];  Service: Cardiovascular;  Laterality: N/A;  . CORONARY STENT INTERVENTION N/A 01/11/2019   Procedure: CORONARY STENT INTERVENTION;  Surgeon: Burnell [name], MD;  Location: [place] [facility];  Service: Cardiovascular;  Laterality: N/A;  . ELECTROPHYSIOLOGIC STUDY N/A 12/19/2015   Procedure: Atrial Fibrillation Ablation;  Surgeon: [name], MD;  Location: [place] [facility];  Service: Cardiovascular;  Laterality: N/A;  . ESOPHAGOGASTRODUODENOSCOPY (EGD) WITH PROPOFOL N/A 12/02/2018   medium HH [name], MD)  . INTRAVASCULAR PRESSURE WIRE/FFR STUDY N/A 01/10/2019   Procedure: INTRAVASCULAR PRESSURE WIRE/FFR STUDY;  Surgeon: Leonie Man, MD;  Location: [place] [facility];  Service: Cardiovascular;  Laterality: N/A;  . KNEE ARTHROSCOPY Left 03/2016   Dr. [name]  . LEFT HEART CATH AND CORONARY ANGIOGRAPHY N/A 01/10/2019   Procedure: LEFT HEART CATH AND CORONARY ANGIOGRAPHY;  Surgeon: Leonie Man, MD;  Location: [place] [facility];  Service: Cardiovascular;  Laterality: N/A;  . PATELLAR TENDON REPAIR Left 2008  . POLYPECTOMY  12/02/2018   Procedure: POLYPECTOMY;  Surgeon: [name], MD;  Location: [facility] ENDOSCOPY;  Service: Endoscopy;;  . REPLACEMENT TOTAL KNEE Right 2006  . TOTAL KNEE ARTHROPLASTY Left 02/09/2017   Procedure: LEFT TOTAL KNEE ARTHROPLASTY, EXCISION LEFT DISTAL THIGH MASS;  Surgeon: [name], MD;  Location: [facility] OR[facility];  Service: Orthopedics;  Laterality: Left;  90 mins  .  TRICEPS TENDON REPAIR Left 2013    Allergies  Allergies  Allergen Reactions  . Penicillins Rash and Other (See Comments)    "Blistering rash" Has patient had a PCN reaction causing immediate rash, facial/tongue/throat swelling, SOB or lightheadedness with hypotension: Yes Has patient  had a PCN reaction causing severe rash involving mucus membranes or skin necrosis: No Has patient had a PCN reaction that required hospitalization: No Has patient had a PCN reaction occurring within the last 10 years: No If all of the above answers are "NO", then may proceed with Cephalosporin use.   . Amlodipine Swelling and Other (See Comments)    Peripheral edema  . Lisinopril Cough  . Mevacor [Lovastatin] Other (See Comments)    Caused cataracts and elevated liver enzymes  . Plavix [Clopidogrel]     Muscle aches   . Vicodin [Hydrocodone-Acetaminophen] Nausea And Vomiting  . Zocor [Simvastatin] Other (See Comments)    LFT increase with simvastatin and lovastatin   . Oseltamivir Phosphate Itching and Rash    History of Present Illness  Mr. Quebedeaux has a past medical history of coronary artery disease.  He had a 70% LAD stenosis with his 2016 and 2017 cardiac catheterizations.  His Myoview showed low risk.  Medical management was recommended.  He has a long history of DOE.  He has been followed by Dr. Annamaria Boots for his moderate COPD.  His symptoms have persisted despite treatment for his COPD.  He was admitted for a diagnostic cardiac catheterization 01/10/2019.  The cath revealed a high-grade proximal LAD lesion.  His LV function at that time was normal but he did have elevated LVEDP.  On 01/11/2019 he underwent PCI to his LAD with DES x1.  No other significant disease was noted and this produced good results.  He has been on Xarelto for his PAF and a history of PE in 2018 after knee surgery.  He was shown to be in in normal sinus rhythm when he was admitted for his PCI in November 2020.  However, he was noted to be in atrial fibrillation again post PCI.  At that time his beta-blocker was increased.  He was last seen by Kerin Ransom, PA-C 01/26/2019.  During that time he stated he felt weak although, his dyspnea on exertion and increased work of breathing had improved.  His EKG at that time showed  normal sinus rhythm.  He presents the clinic today for an evaluation of his lower extremity edema and states over the last 3 weeks at night bottoms of his feet have been very painful after he lays stationary for about an hour.  He states that the bottoms of his feet become red as well.  The pain dissipates with sitting up and changing positions.  He states that he was prescribed gabapentin from his PCP but does not like the way that it makes him feel.  He is worried that his circulation has been affected not allowing enough blood flow to his feet at night.  He has not had any lower extremity edema.  He also states that his PCP has assessed his A1c and his B12 which are normal.  He denies chest pain, shortness of breath, lower extremity edema, fatigue, palpitations, melena, hematuria, hemoptysis, diaphoresis, weakness, presyncope, syncope, orthopnea, and PND.    Home Medications    Prior to Admission medications   Medication Sig Start Date End Date Taking? Authorizing Provider  acetaminophen (TYLENOL) 500 MG tablet Take 1 tablet (500 mg total) by mouth 3 (  three) times daily as needed. 01/19/18   [name], MD  Ascorbic Acid (VITAMIN C PO) Take 500 mg by mouth daily.     [provider]  atorvastatin (LIPITOR) 40 MG tablet Take 1 tablet (40 mg total) by mouth daily. 02/10/19   Erlene Quan, PA-C  budesonide-formoterol (SYMBICORT) 160-4.5 MCG/ACT inhaler Inhale 2 puffs into the lungs 2 (two) times daily. 02/16/19   Baird Lyons D, MD  Cholecalciferol (VITAMIN D3) 2000 units TABS Take 2,000 Units by mouth daily.    [provider]  clopidogrel (PLAVIX) 75 MG tablet Take 1 tablet (75 mg total) by mouth daily with breakfast. 01/26/19   Kilroy, Doreene Burke, PA-C  ezetimibe (ZETIA) 10 MG tablet TAKE 1 TABLET DAILY Patient taking differently: Take 10 mg by mouth daily.  05/31/18   Skeet Latch, MD  furosemide (LASIX) 20 MG tablet Take 1 tablet (20 mg total) by mouth daily.  01/26/19   Erlene Quan, PA-C  gabapentin (NEURONTIN) 100 MG capsule Take 1 capsule (100 mg total) by mouth at bedtime. May increase to 200mg  after 3 days then again 300mg  if lower dose ineffective 03/09/19   [name], MD  losartan (COZAAR) 100 MG tablet TAKE 1 TABLET ONCE DAILY 02/17/19   Skeet Latch, MD  metoprolol succinate (TOPROL-XL) 50 MG 24 hr tablet Take 3 tablets (150 mg total) by mouth daily. Take with or immediately following a meal. 01/26/19   Kilroy, Doreene Burke, PA-C  nitroGLYCERIN (NITROSTAT) 0.4 MG SL tablet Place 1 tablet (0.4 mg total) under the tongue every 5 (five) minutes as needed. 01/12/19   Cheryln Manly, NP  pantoprazole (PROTONIX) 40 MG tablet Take 1 tablet (40 mg total) by mouth daily. 01/26/19   Erlene Quan, PA-C  potassium chloride (KLOR-CON) 10 MEQ tablet TAKE 1 TABLET TWICE A DAY 01/27/19   Kilroy, Doreene Burke, PA-C  rivaroxaban (XARELTO) 20 MG TABS tablet TAKE 1 TABLET DAILY WITH SUPPER 02/16/19   Erlene Quan, PA-C  Spacer/Aero-Holding Chambers (AEROCHAMBER MV) inhaler Use as instructed 08/23/18   Deneise Lever, MD  vitamin B-12 (CYANOCOBALAMIN) 1000 MCG tablet Take 1 tablet (1,000 mcg total) by mouth daily. 03/09/19   [name], MD    Family History    Family History  Problem Relation Age of Onset  . Heart attack Father   . COPD Father   . Hypertension Father   . Heart disease Brother   . COPD Brother   . Asthma Sister   . Stroke Neg Hx    He indicated that his mother is deceased. He indicated that his father is deceased. He indicated that his sister is alive. He indicated that only one of his two brothers is alive. He indicated that his maternal grandmother is deceased. He indicated that his maternal grandfather is deceased. He indicated that his paternal grandmother is deceased. He indicated that his paternal grandfather is deceased. He indicated that the status of his neg hx is unknown.  Social History    Social History    Socioeconomic History  . Marital status: Married    Spouse name: Not on file  . Number of children: Not on file  . Years of education: Not on file  . Highest education level: Not on file  Occupational History  . Occupation: retired    Fish farm manager: RETIRED    Comment: Art gallery manager  Tobacco Use  . Smoking status: Never Smoker  . Smokeless tobacco: Never Used  Substance and Sexual Activity  .  Alcohol use: Yes    Alcohol/week: 14.0 standard drinks    Types: 14 Cans of beer per week    Comment: 2 beers daily  . Drug use: No  . Sexual activity: Not Currently  Other Topics Concern  . Not on file  Social History Narrative   Lives with wife, no pets   Retired   Occ: Art gallery manager   Edu: master's   Activity: golf   Diet: good water, fruits/vegetables daily   Social Determinants of Health   Financial Resource Strain:   . Difficulty of Paying Living Expenses: Not on file  Food Insecurity:   . Worried About Charity fundraiser in the Last Year: Not on file  . Ran Out of Food in the Last Year: Not on file  Transportation Needs:   . Lack of Transportation (Medical): Not on file  . Lack of Transportation (Non-Medical): Not on file  Physical Activity:   . Days of Exercise per Week: Not on file  . Minutes of Exercise per Session: Not on file  Stress:   . Feeling of Stress : Not on file  Social Connections:   . Frequency of Communication with Friends and Family: Not on file  . Frequency of Social Gatherings with Friends and Family: Not on file  . Attends Religious Services: Not on file  . Active Member of Clubs or Organizations: Not on file  . Attends Archivist Meetings: Not on file  . Marital Status: Not on file  Intimate Partner Violence:   . Fear of Current or Ex-Partner: Not on file  . Emotionally Abused: Not on file  . Physically Abused: Not on file  . Sexually Abused: Not on file     Review of Systems    General:  No chills, fever, night sweats  or weight changes.  Cardiovascular:  No chest pain, dyspnea on exertion, edema, orthopnea, palpitations, paroxysmal nocturnal dyspnea. Dermatological: No rash, lesions/masses Respiratory: No cough, dyspnea Urologic: No hematuria, dysuria Abdominal:   No nausea, vomiting, diarrhea, bright red blood per rectum, melena, or hematemesis Neurologic:  No visual changes, wkns, changes in mental status. All other systems reviewed and are otherwise negative except as noted above.  Physical Exam    VS:  BP (!) 150/82   Pulse 76   Temp (!) 97.5 F (36.4 C)   Ht 5\' 11"  (1.803 m)   Wt 215 lb 9.6 oz (97.8 kg)   SpO2 94%   BMI 30.07 kg/m  , BMI Body mass index is 30.07 kg/m. GEN: Well nourished, well developed, in no acute distress. HEENT: normal. Neck: Supple, no JVD, carotid bruits, or masses. Cardiac: RRR, no murmurs, rubs, or gallops. No clubbing, cyanosis, edema.  Radials/DP/PT 2+ and equal bilaterally.  Respiratory:  Respirations regular and unlabored, clear to auscultation bilaterally. GI: Soft, nontender, nondistended, BS + x 4. MS: no deformity or atrophy. Skin: warm and dry, no rash. Neuro:  Strength and sensation are intact. Psych: Normal affect.  Accessory Clinical Findings    ECG personally reviewed by me today-none today  EKG 01/26/2019 Sinus rhythm with first-degree AV block right bundle branch block left axis deviation 69 bpm  Echocardiogram 03/25/2018 IMPRESSIONS    1. The left ventricle has a visually estimated ejection fraction of of 50%. The cavity size was normal. There is mildly increased left ventricular wall thickness. Left ventricular diastolic Doppler parameters are consistent with impaired relaxation Left  ventricular diffuse hypokinesis.  2. The right ventricle has mildly reduced  systolic function. The cavity was mildly enlarged. There is no increase in right ventricular wall thickness.  3. Left atrial size was mildly dilated.  4. Right atrial size was  mildly dilated.  5. The mitral valve is normal in structure. No evidence of mitral valve stenosis. Trivial regurgitation.  6. The tricuspid valve is normal in structure.  7. The aortic valve is tricuspid There is mild calcification of the aortic valve. Aortic valve regurgitation is trivial by color flow Doppler. No stenosis.  8. The pulmonic valve was normal in structure.  9. There is mild dilatation of the aortic root measuring 38 mm. 10. Right atrial pressure is estimated at 3 mmHg. 11. No complete TR doppler jet so unable to estimate PA systolic pressure.  Cardiac catheterization 01/11/2019  Prox LAD to Mid LAD lesion is 80% stenosed.  A drug-eluting stent was successfully placed using a STENT RESOLUTE ONYX 3.5X18.  Post intervention, there is a 0% residual stenosis.  Mid LAD lesion is 70% stenosed.  A drug-eluting stent was successfully placed using a STENT RESOLUTE ONYX 2.75X26.  Post intervention, there is a 0% residual stenosis.   1. Severe, heavily calcified stenoses in the proximal and mid LAD 2. Successful orbital atherectomy of the proximal and mid LAD 3. Successful placement of 2 drug eluting stents in the proximal and mid LAD (overlapping)  Recommendations: Continue ASA and Plavix for at least six months.   Diagnostic Dominance: Right    Intervention      Assessment & Plan   1.  Peripheral neuropathy -absent.  Is having lower extremity burning pain at night that appears to be nerve related.  Patient states he does not like the way that the gabapentin makes him feel at night. Continue furosemide 20 mg daily Heart healthy low-sodium diet Return to PCP for further evaluation and treatment  Coronary artery disease-no chest pain today.  Cardiac catheterization 01/11/2019, PCI with DES x1 to pLAD Continue aspirin 81 mg tablet daily Continue clopidogrel 75 mg tablet daily Continue atorvastatin 40 mg daily Continue metoprolol succinate 150 mg daily Continue  losartan 100 mg tablet daily Continue 0.4 mg sublingual nitroglycerin as needed Heart healthy low-sodium diet Increase physical activity as tolerated  Paroxysmal atrial fibrillation-status post ablation 12/2015.  Noted to be in normal sinus rhythm until PCI 12/20 and went into atrial fibrillation post PCI.  Normal sinus rhythm follow-up PCI visit 69 bpm with RBBB.  Heart rate today 76 bpm.  CHA2DS2-VASc score 5 and history of PE 1/19 status post knee surgery. Continue metoprolol succinate 150 mg daily Continue Xarelto 20 mg daily  COPD-moderate COPD.  No increased work of breathing today.  Continue current medication regimen Followed by Dr. Annamaria Boots  Disposition: Follow-up with Kerin Ransom and Dr. [name] as scheduled   Jossie Ng. [facility] [address] Office [phone] Fax [phone]

## 2019-03-11 ENCOUNTER — Encounter: Payer: Self-pay | Admitting: General Practice

## 2019-03-11 ENCOUNTER — Ambulatory Visit (INDEPENDENT_AMBULATORY_CARE_PROVIDER_SITE_OTHER): Payer: Medicare Other | Admitting: General Practice

## 2019-03-11 ENCOUNTER — Other Ambulatory Visit: Payer: Self-pay

## 2019-03-11 VITALS — BP 150/82 | HR 76 | Temp 97.5°F | Ht 71.0 in | Wt 215.6 lb

## 2019-03-11 DIAGNOSIS — I480 Paroxysmal atrial fibrillation: Secondary | ICD-10-CM | POA: Diagnosis not present

## 2019-03-11 DIAGNOSIS — J449 Chronic obstructive pulmonary disease, unspecified: Secondary | ICD-10-CM | POA: Diagnosis not present

## 2019-03-11 DIAGNOSIS — I2510 Atherosclerotic heart disease of native coronary artery without angina pectoris: Secondary | ICD-10-CM | POA: Diagnosis not present

## 2019-03-11 DIAGNOSIS — G629 Polyneuropathy, unspecified: Secondary | ICD-10-CM

## 2019-03-11 DIAGNOSIS — Z9861 Coronary angioplasty status: Secondary | ICD-10-CM

## 2019-03-11 NOTE — Patient Instructions (Addendum)
MAKE SURE TO DISCUSS THE PERIPHERAL NEUROPATHY WITH [facility]  Follow-Up: PLEASE KEEP SCHEDULED APPOINTMENTS  In Person [place], [place] AND DR CAMNITZ.    Reduce your risk of getting COVID-19 With your heart disease it is especially important for people at increased risk of severe illness from COVID-19, and those who live with them, to protect themselves from getting COVID-19. The best way to protect yourself and to help reduce the spread of the virus that causes COVID-19 is to: [name] Limit your interactions with other people as much as possible. . Take precautions to prevent getting COVID-19 when you do interact with others. If you start feeling sick and think you may have COVID-19, get in touch with your healthcare provider within 24 hours.  At [facility], you and your health needs are our priority.  As part of our continuing mission to provide you with exceptional heart care, we have created designated Provider Care Teams.  These Care Teams include your primary Cardiologist (physician) and Advanced Practice Providers (APPs -  Physician Assistants and Nurse Practitioners) who all work together to provide you with the care you need, when you need it.  Thank you for choosing [facility] at [facility]    Peripheral Neuropathy Peripheral neuropathy is a type of nerve damage. It affects nerves that carry signals between the spinal cord and the arms, legs, and the rest of the body (peripheral nerves). It does not affect nerves in the spinal cord or brain. In peripheral neuropathy, one nerve or a group of nerves may be damaged. Peripheral neuropathy is a broad category that includes many specific nerve disorders, like diabetic neuropathy, hereditary neuropathy, and carpal tunnel syndrome. What are the causes? This condition may be caused by:  Diabetes. This is the most common cause of peripheral neuropathy.  Nerve injury.  Pressure or stress on a nerve that lasts a long  time.  Lack (deficiency) of B vitamins. This can result from alcoholism, poor diet, or a restricted diet.  Infections.  Autoimmune diseases, such as rheumatoid arthritis and systemic lupus erythematosus.  Nerve diseases that are passed from parent to child (inherited).  Some medicines, such as cancer medicines (chemotherapy).  Poisonous (toxic) substances, such as lead and mercury.  Too little blood flowing to the legs.  Kidney disease.  Thyroid disease. In some cases, the cause of this condition is not known. What are the signs or symptoms? Symptoms of this condition depend on which of your nerves is damaged. Common symptoms include:  Loss of feeling (numbness) in the feet, hands, or both.  Tingling in the feet, hands, or both.  Burning pain.  Very sensitive skin.  Weakness.  Not being able to move a part of the body (paralysis).  Muscle twitching.  Clumsiness or poor coordination.  Loss of balance.  Not being able to control your bladder.  Feeling dizzy.  Sexual problems. How is this diagnosed? Diagnosing and finding the cause of peripheral neuropathy can be difficult. Your health care provider will take your medical history and do a physical exam. A neurological exam will also be done. This involves checking things that are affected by your brain, spinal cord, and nerves (nervous system). For example, your health care provider will check your reflexes, how you move, and what you can feel. You may have other tests, such as:  Blood tests.  Electromyogram (EMG) and nerve conduction tests. These tests check nerve function and how well the nerves are controlling the muscles.  Imaging tests,  such as CT scans or MRI to rule out other causes of your symptoms.  Removing a small piece of nerve to be examined in a lab (nerve biopsy). This is rare.  Removing and examining a small amount of the fluid that surrounds the brain and spinal cord (lumbar puncture). This is  rare. How is this treated? Treatment for this condition may involve:  Treating the underlying cause of the neuropathy, such as diabetes, kidney disease, or vitamin deficiencies.  Stopping medicines that can cause neuropathy, such as chemotherapy.  Medicine to relieve pain. Medicines may include: ? Prescription or over-the-counter pain medicine. ? Antiseizure medicine. ? Antidepressants. ? Pain-relieving patches that are applied to painful areas of skin.  Surgery to relieve pressure on a nerve or to destroy a nerve that is causing pain.  Physical therapy to help improve movement and balance.  Devices to help you move around (assistive devices). Follow these instructions at home: Medicines  Take over-the-counter and prescription medicines only as told by your health care provider. Do not take any other medicines without first asking your health care provider.  Do not drive or use heavy machinery while taking prescription pain medicine. Lifestyle   Do not use any products that contain nicotine or tobacco, such as cigarettes and e-cigarettes. Smoking keeps blood from reaching damaged nerves. If you need help quitting, ask your health care provider.  Avoid or limit alcohol. Too much alcohol can cause a vitamin B deficiency, and vitamin B is needed for healthy nerves.  Eat a healthy diet. This includes: ? Eating foods that are high in fiber, such as fresh fruits and vegetables, whole grains, and beans. ? Limiting foods that are high in fat and processed sugars, such as fried or sweet foods. General instructions   If you have diabetes, work closely with your health care provider to keep your blood sugar under control.  If you have numbness in your feet: ? Check every day for signs of injury or infection. Watch for redness, warmth, and swelling. ? Wear padded socks and comfortable shoes. These help protect your feet.  Develop a good support system. Living with peripheral  neuropathy can be stressful. Consider talking with a mental health specialist or joining a support group.  Use assistive devices and attend physical therapy as told by your health care provider. This may include using a walker or a cane.  Keep all follow-up visits as told by your health care provider. This is important. Contact a health care provider if:  You have new signs or symptoms of peripheral neuropathy.  You are struggling emotionally from dealing with peripheral neuropathy.  Your pain is not well-controlled. Get help right away if:  You have an injury or infection that is not healing normally.  You develop new weakness in an arm or leg.  You fall frequently. Summary  Peripheral neuropathy is when the nerves in the arms, or legs are damaged, resulting in numbness, weakness, or pain.  There are many causes of peripheral neuropathy, including diabetes, pinched nerves, vitamin deficiencies, autoimmune disease, and hereditary conditions.  Diagnosing and finding the cause of peripheral neuropathy can be difficult. Your health care provider will take your medical history, do a physical exam, and do tests, including blood tests and nerve function tests.  Treatment involves treating the underlying cause of the neuropathy and taking medicines to help control pain. Physical therapy and assistive devices may also help. This information is not intended to replace advice given to you by your  health care provider. Make sure you discuss any questions you have with your health care provider. Document Revised: 01/09/2017 Document Reviewed: 04/07/2016 Elsevier Patient Education  2020 [employer].

## 2019-03-14 ENCOUNTER — Encounter: Payer: Self-pay | Admitting: Family Medicine

## 2019-03-18 ENCOUNTER — Ambulatory Visit: Payer: Medicare Other | Attending: Internal Medicine

## 2019-03-18 DIAGNOSIS — Z23 Encounter for immunization: Secondary | ICD-10-CM | POA: Insufficient documentation

## 2019-03-18 NOTE — Progress Notes (Signed)
   Covid-19 Vaccination Clinic  Name:  Theodore [name]    MRN: [id number] DOB: 1934-04-10  03/18/2019  [name] was observed post Covid-19 immunization for 15 minutes without incidence. He was provided with Vaccine Information Sheet and instruction to access the V-Safe system.   [name] was instructed to call 911 with any severe reactions post vaccine: [name] Difficulty breathing  . Swelling of your face and throat  . A fast heartbeat  . A bad rash all over your body  . Dizziness and weakness    Immunizations Administered    Name Date Dose VIS Date Route   Pfizer COVID-19 Vaccine 03/18/2019 12:23 PM 0.3 mL 01/21/2019 Intramuscular   Manufacturer: [place]   Lot: [id number]   [name]: [id number]

## 2019-04-26 ENCOUNTER — Ambulatory Visit (INDEPENDENT_AMBULATORY_CARE_PROVIDER_SITE_OTHER): Payer: Medicare Other | Admitting: Cardiology

## 2019-04-26 ENCOUNTER — Other Ambulatory Visit: Payer: Self-pay

## 2019-04-26 ENCOUNTER — Encounter: Payer: Self-pay | Admitting: Cardiology

## 2019-04-26 VITALS — BP 136/72 | HR 67 | Ht 71.0 in | Wt 217.8 lb

## 2019-04-26 DIAGNOSIS — I480 Paroxysmal atrial fibrillation: Secondary | ICD-10-CM

## 2019-04-26 NOTE — Patient Instructions (Signed)
Medication Instructions:  Your physician recommends that you continue on your current medications as directed. Please refer to the Current Medication list given to you today.  *If you need a refill on your cardiac medications before your next appointment, please call your pharmacy*   Lab Work: None ordered If you have labs (blood work) drawn today and your tests are completely normal, you will receive your results only by: . MyChart Message (if you have MyChart) OR . A paper copy in the mail If you have any lab test that is abnormal or we need to change your treatment, we will call you to review the results.   Testing/Procedures: None ordered   Follow-Up: At [facility], you and your health needs are our priority.  As part of our continuing mission to provide you with exceptional heart care, we have created designated Provider Care Teams.  These Care Teams include your primary Cardiologist (physician) and Advanced Practice Providers (APPs -  Physician Assistants and Nurse Practitioners) who all work together to provide you with the care you need, when you need it.  We recommend signing up for the patient portal called "MyChart".  Sign up information is provided on this After Visit Summary.  MyChart is used to connect with patients for Virtual Visits (Telemedicine).  Patients are able to view lab/test results, encounter notes, upcoming appointments, etc.  Non-urgent messages can be sent to your provider as well.   To learn more about what you can do with MyChart, go to [email or web address].    Your next appointment:   6 month(s)  The format for your next appointment:   In Person  Provider:   Will Camnitz, MD   Thank you for choosing [facility]!!   Miracle Criado, RN [phone]    Other Instructions    

## 2019-04-26 NOTE — Progress Notes (Signed)
Electrophysiology Office Note   Date:  04/26/2019   ID:  Theodore [name], DOB 1934/05/27, MRN [id number]  PCP:  [name], MD  Cardiologist:  Orlinda Blalock Electrophysiologist:  Dr [name]    CC: Follow up for persistent atrial fibrillation   History of Present Illness: Theodore [name] is a 85 y.o. male who presents today for electrophysiology evaluation.   He has a history of hypertension, hyperlipidemia, persistent atrial fibrillation, asthma, and rheumatic fever. Theodore [name] was diagnosed with atrial fibrillation in 2014.  He was initially on Pradaxa and switched to Xarelto.  He had an episode of chest pain 08/2014 and had a Lexiscan Myoview 08/31/14 that showed an old inferior scar with peri-infarct ischemia and LVEF 49%.  He then had a LHC that revealed 70% ostial LAD, 50% prox LAD, 30% OM1 and 35% RCA.    Status post atrial fibrillation ablation on 12/19/15.  He had a recent left heart catheterization that showed an 80% LAD lesion and is now status post stenting of the LAD.  He was put on Plavix.  Ever since that, he has been having some muscle aches as well as neuropathy in his feet.  His neuropathy bothers him mainly at night.  He does take gabapentin which has somewhat relieved his discomfort.  Today, denies symptoms of palpitations, chest pain, shortness of breath, orthopnea, PND, lower extremity edema, claudication, dizziness, presyncope, syncope, bleeding, or neurologic sequela. The patient is tolerating medications without difficulties.    Past Medical History:  Diagnosis Date  . CAP (community acquired pneumonia) 01/19/2018  . History of arthritis   . History of rheumatic fever   . Hypercholesterolemia   . Hypertension   . Nocturia   . PAF (paroxysmal atrial fibrillation) [facility]) January 2013   Placed on Pradaxa. Did not require cardioversion; spontaneously converted  . Peripheral edema   . Right bundle branch block   . SOB (shortness of breath)   . Tendonitis of  elbow, left    Past Surgical History:  Procedure Laterality Date  . ATRIAL FIBRILLATION ABLATION  12/19/2015  . BACK SURGERY  12/24/09   fusion C3-C4  . BACK SURGERY  2010   fusion L4-L5  . CARDIAC CATHETERIZATION  2009   NONOBSTRUCTIVE ATHERSCLEROTIC CORONARY DISEASE AND NORMAL  LV FUNCTION  . CARDIAC CATHETERIZATION N/A 09/08/2014   Procedure: Left Heart Cath and Coronary Angiography;  Surgeon: Belva Crome, MD;  Location: [place] CV LAB;  Service: Cardiovascular;  Laterality: N/A;  . CARDIAC CATHETERIZATION N/A 09/28/2015   Procedure: Left Heart Cath and Coronary Angiography;  Surgeon: Leonie Man, MD;  Location: [place] [facility] LAB;  Service: Cardiovascular;  Laterality: N/A;  . CARDIAC CATHETERIZATION N/A 09/28/2015   Procedure: Intravascular Pressure Wire/FFR Study;  Surgeon: Leonie Man, MD;  Location: [place] [facility] LAB;  Service: Cardiovascular;  Laterality: N/A;  . CARDIOVERSION N/A 10/23/2015   Procedure: CARDIOVERSION;  Surgeon: [name], MD;  Location: [facility] ENDOSCOPY;  Service: Cardiovascular;  Laterality: N/A;  . COLONOSCOPY  2013   per patient, rpt 5 yrs  . COLONOSCOPY WITH PROPOFOL N/A 12/02/2018   TAx2, HP, diverticulosis [name], MD)  . CORONARY ATHERECTOMY N/A 01/11/2019   Procedure: CORONARY ATHERECTOMY;  Surgeon: Burnell [name], MD;  Location: [place] CV LAB;  Service: Cardiovascular;  Laterality: N/A;  . CORONARY STENT INTERVENTION N/A 01/11/2019   Procedure: CORONARY STENT INTERVENTION;  Surgeon: Burnell [name], MD;  Location: [place] CV LAB;  Service: Cardiovascular;  Laterality: N/A;  . ELECTROPHYSIOLOGIC STUDY N/A 12/19/2015   Procedure: Atrial Fibrillation Ablation;  Surgeon: Andron Marrazzo Meredith Leeds, MD;  Location: [place] [facility] LAB;  Service: Cardiovascular;  Laterality: N/A;  . ESOPHAGOGASTRODUODENOSCOPY (EGD) WITH PROPOFOL N/A 12/02/2018   medium HH [name], MD)  . INTRAVASCULAR PRESSURE WIRE/FFR STUDY N/A  01/10/2019   Procedure: INTRAVASCULAR PRESSURE WIRE/FFR STUDY;  Surgeon: Leonie Man, MD;  Location: [place] CV LAB;  Service: Cardiovascular;  Laterality: N/A;  . KNEE ARTHROSCOPY Left 03/2016   Dr. [name]  . LEFT HEART CATH AND CORONARY ANGIOGRAPHY N/A 01/10/2019   Procedure: LEFT HEART CATH AND CORONARY ANGIOGRAPHY;  Surgeon: Leonie Man, MD;  Location: [place] CV LAB;  Service: Cardiovascular;  Laterality: N/A;  . PATELLAR TENDON REPAIR Left 2008  . POLYPECTOMY  12/02/2018   Procedure: POLYPECTOMY;  Surgeon: [name], MD;  Location: [facility] ENDOSCOPY;  Service: Endoscopy;;  . REPLACEMENT TOTAL KNEE Right 2006  . TOTAL KNEE ARTHROPLASTY Left 02/09/2017   Procedure: LEFT TOTAL KNEE ARTHROPLASTY, EXCISION LEFT DISTAL THIGH MASS;  Surgeon: [name], MD;  Location: [facility] ORS;  Service: Orthopedics;  Laterality: Left;  90 mins  . TRICEPS TENDON REPAIR Left 2013     Current Outpatient Medications  Medication Sig Dispense Refill  . acetaminophen (TYLENOL) 500 MG tablet Take 1 tablet (500 mg total) by mouth 3 (three) times daily as needed. 30 tablet 0  . Ascorbic Acid (VITAMIN C PO) Take 500 mg by mouth daily.     [name] atorvastatin (LIPITOR) 40 MG tablet Take 1 tablet (40 mg total) by mouth daily. 90 tablet 3  . budesonide-formoterol (SYMBICORT) 160-4.5 MCG/ACT inhaler Inhale 2 puffs into the lungs 2 (two) times daily. 3 Inhaler 3  . Cholecalciferol (VITAMIN D3) 2000 units TABS Take 2,000 Units by mouth daily.    [name] ezetimibe (ZETIA) 10 MG tablet TAKE 1 TABLET DAILY 90 tablet 3  . furosemide (LASIX) 20 MG tablet Take 1 tablet (20 mg total) by mouth daily. 90 tablet 3  . gabapentin (NEURONTIN) 100 MG capsule Take 1 capsule (100 mg total) by mouth at bedtime. May increase to 200mg  after 3 days then again 300mg  if lower dose ineffective 90 capsule 3  . losartan (COZAAR) 100 MG tablet TAKE 1 TABLET ONCE DAILY 90 tablet 3  . metoprolol succinate (TOPROL-XL) 50 MG 24 hr tablet Take 3  tablets (150 mg total) by mouth daily. Take with or immediately following a meal. 270 tablet 2  . nitroGLYCERIN (NITROSTAT) 0.4 MG SL tablet Place 1 tablet (0.4 mg total) under the tongue every 5 (five) minutes as needed. 25 tablet 2  . pantoprazole (PROTONIX) 40 MG tablet Take 1 tablet (40 mg total) by mouth daily. 90 tablet 3  . potassium chloride (KLOR-CON) 10 MEQ tablet TAKE 1 TABLET TWICE A DAY 180 tablet 3  . rivaroxaban (XARELTO) 20 MG TABS tablet TAKE 1 TABLET DAILY WITH SUPPER 90 tablet 1  . Spacer/Aero-Holding Chambers (AEROCHAMBER MV) inhaler Use as instructed 1 each 0  . ticagrelor (BRILINTA) 90 MG TABS tablet Take 1 tablet (90 mg total) by mouth 2 (two) times daily. 180 tablet 1  . vitamin B-12 (CYANOCOBALAMIN) 1000 MCG tablet Take 1 tablet (1,000 mcg total) by mouth daily.     No current facility-administered medications for this visit.    Allergies:   Penicillins, Amlodipine, Lisinopril, Mevacor [lovastatin], Plavix [clopidogrel], Vicodin [hydrocodone-acetaminophen], Zocor [simvastatin], and Oseltamivir phosphate   Social History:  The patient  reports that he  has never smoked. He has never used smokeless tobacco. He reports current alcohol use of about 14.0 standard drinks of alcohol per week. He reports that he does not use drugs.   Family History:  The patient's family history includes Asthma in his sister; COPD in his brother and father; Heart attack in his father; Heart disease in his brother; Hypertension in his father.    ROS:  Please see the history of present illness.   Otherwise, review of systems is positive for none.   All other systems are reviewed and negative.   PHYSICAL EXAM: VS:  BP 136/72   Pulse 67   Ht 5\' 11"  (1.803 m)   Wt 217 lb 12.8 oz (98.8 kg)   SpO2 95%   BMI 30.38 kg/m  , BMI Body mass index is 30.38 kg/m. GEN: Well nourished, well developed, in no acute distress  HEENT: normal  Neck: no JVD, carotid bruits, or masses Cardiac: RRR; no  murmurs, rubs, or gallops,no edema  Respiratory:  clear to auscultation bilaterally, normal work of breathing GI: soft, nontender, nondistended, + BS MS: no deformity or atrophy  Skin: warm and dry Neuro:  Strength and sensation are intact Psych: euthymic mood, full affect  EKG:  EKG is ordered today. Personal review of the ekg ordered shows sinus rhythm, right bundle branch block   Recent Labs: 01/18/2019: ALT 20; BUN 17; Creatinine, Ser 1.13; Potassium 4.2; Sodium 133 03/09/2019: Hemoglobin 14.0; Platelets 168.0; TSH 1.76    Lipid Panel     Component Value Date/Time   CHOL 141 01/18/2019 0756   CHOL 121 07/29/2016 0822   TRIG 70.0 01/18/2019 0756   HDL 59.10 01/18/2019 0756   HDL 56 07/29/2016 0822   CHOLHDL 2 01/18/2019 0756   VLDL 14.0 01/18/2019 0756   LDLCALC 68 01/18/2019 0756   LDLCALC 56 07/29/2016 0822     Wt Readings from Last 3 Encounters:  04/26/19 217 lb 12.8 oz (98.8 kg)  03/11/19 215 lb 9.6 oz (97.8 kg)  03/09/19 216 lb 4 oz (98.1 kg)      Other studies Reviewed: Additional studies/ records that were reviewed today include: SPECT 10/17/15, TTE 10/09/14, Echo 07/13/17  Review of the above records today demonstrates:   The left ventricular ejection fraction is moderately decreased (30-44%).  Nuclear stress EF: 39%.  There was no ST segment deviation noted during stress.  There is a small defect of moderate severity present in the apical lateral and apex location and medium sized defect of moderate severity in the basal inferior, mild inferior and apical inferior location. These defects are fixed and consistent with scar. No ischemia noted.  This is an intermediate risk study due to LV dysfunction.   - Left ventricle: The cavity size was normal. Wall thickness was   normal. Systolic function was normal. The estimated ejection   fraction was in the range of 55% to 60%. Wall motion was normal;   there were no regional wall motion abnormalities. Doppler    parameters are consistent with abnormal left ventricular   relaxation (grade 1 diastolic dysfunction). - Aortic valve: There was mild regurgitation. - Mitral valve: There was mild regurgitation. - Left atrium: The atrium was mildly dilated. - Right ventricle: The cavity size was mildly dilated. - Right atrium: The atrium was mildly dilated. - Pulmonary arteries: Systolic pressure was mildly increased. PA   peak pressure: 36 mm Hg (S).  Impressions:  - Normal LV systolic function; grade 1 diastolic dysfunction; mild  biatrial enlargement; mild RVE; calcified aortic valve with mild   AI; mild MR; trace TR with mildly elevated pulmonary pressure.  Echo 07/13/17 - Left ventricle: The cavity size was normal. Wall thickness was   increased in a pattern of mild LVH. Systolic function was normal.   The estimated ejection fraction was in the range of 55% to 60%.   Left ventricular diastolic function parameters were normal. - Aortic valve: There was mild regurgitation. - Left atrium: The atrium was moderately dilated. - Atrial septum: No defect or patent foramen ovale was identified. - Pulmonary arteries: PA peak pressure: 44 mm Hg (S).  ASSESSMENT AND PLAN:  1.  Persistent atrial fibrillation: Status post ablation 12/19/2015.  Currently on metoprolol and Xarelto.  CHA2DS2-VASc of 5.  He has had no further episodes of atrial fibrillation.   2. CAD: Status post LAD stent.  No current chest pain.  Continue current management.  He is unfortunately having a neuropathy that he feels is somewhat related to his Plavix.  I discussed this with our pharmacist who has not seen this side effect with Plavix.  He wishes to switch to Brilinta.  We Fleur Audino discuss this with his primary cardiologist.  3. Chronic systolic heart failure: Currently on metoprolol, losartan, Lasix.  Ejection fraction has since normalized.    4. Hypertension: Currently well controlled  5. Hyperlipidemia: Continue Lipitor  6.  Ectatic abdominal aorta: Needs repeat abdominal ultrasound for evaluation.  Current medicines are reviewed at length with the patient today.   The patient does not have concerns regarding his medicines.  The following changes were made today: None  Labs/ tests ordered today include:  Orders Placed This Encounter  Procedures  . EKG 12-Lead     Disposition:   FU with Aycen Porreca 6 months  Signed, Ansen Sayegh Meredith Leeds, MD  04/26/2019 9:09 AM     [facility] [address] [place] [address] [phone] (office) [phone] (fax)

## 2019-04-28 ENCOUNTER — Ambulatory Visit: Payer: Medicare Other | Admitting: Cardiology

## 2019-05-03 NOTE — Telephone Encounter (Signed)
Left message asking pt to call me back to discuss.  Informed that Dr. [name] has given me some instruction/advisement but I have not been able to call him to discuss yet.  Asked pt to call me back to discuss further.

## 2019-05-04 ENCOUNTER — Telehealth: Payer: Self-pay | Admitting: Cardiology

## 2019-05-04 NOTE — Telephone Encounter (Signed)
Left message asking pt to call me back to discuss.  Informed that Dr. [name] has given me some instruction/advisement but I have not been able to call him to discuss yet.  Asked pt to call me back to discuss further.       Documentation   You  Sammer, Scafidi A "Marya Amsler" 17 hours ago (4:54 PM)  Frederik Schmidt, RN routed conversation to You 22 hours ago (11:53 AM)  [name] A "Greg"  Constance Haw, MD 22 hours ago (11:30 AM)   Dr. [name],  I sent a message to Dr. [name] regarding my potential usage of [place]. You expressed some question about that since I am also taking Xarelto. You indicated that you would discuss this with Dr. [name].  I currently take Plavix, but am concerned about some issues I have had since starting it on January 11, 2019. The most serious is Peripheral Neuropathy in my feet while trying to sleep. Dr. Danise Mina prescribed Gabapentin which is only helping a little.   I would like to try Brilinta if it can be taken with Xarelto. Also, since I have not experienced afib for about 2 years since my ablation, is it possible to try Brilinta and aspirin instead of Xarelto for a three month trial? I can go back to Plavix and Xarelto if my PN problem persists after taking Brilinta.  I will certainly do whatever you and Dr. [name] decide.  Thank you, Sherren Kerns

## 2019-05-04 NOTE — Telephone Encounter (Signed)
See telephone encounter from today for further documentation on this

## 2019-05-04 NOTE — Telephone Encounter (Addendum)
Pt advised Dr. [name] and Dr. [name] spoke. Informed recommendation is to stop Plavix, start Brilinita; stop Xarelto, start Eliquis; stop Lipitor, start Crestor. Advised to keep follow up in May with Dr. [name] and call if issues arise after med changes.  Pt not sure why they want to switch his Lipitor, states he has been taking this for years and it has been working.  Aware I will clarify recommendation and discuss when I call him back Friday.  Will forward to pharmD to advise on Brilinta dosing. Pt aware I will follow up on Friday w/ advisement.

## 2019-05-04 NOTE — Telephone Encounter (Signed)
   Pt is returning call regarding Dr. [name] recommendation  Please call

## 2019-05-05 NOTE — Telephone Encounter (Signed)
He is > 1 month out from stenting, so he can start Brilinta 90mg  twice daily with first dose 24 hours after his last Plavix dose.   Lipitor is being changed to Crestor because he had mentioned muscle aching at his last visit. He attributed cramping to his Plavix, which is an unlikely side effect and more likely to be caused by his Lipitor (Crestor is hydrophilic and typically associated with fewer myalgias).  His Xarelto is being changed to Eliquis because Brilinta is a more potent antiplatelet agent than Plavix, and Eliquis has shown about a 50% lower relative risk of bleeding than Xarelto according to phase 4 real world data (ARISTOPHANES trial).

## 2019-05-10 ENCOUNTER — Ambulatory Visit: Payer: Medicare Other | Admitting: Cardiology

## 2019-05-11 MED ORDER — APIXABAN 5 MG PO TABS: 5.0000 mg | ORAL_TABLET | Freq: Two times a day (BID) | ORAL | 1 refills | Status: DC

## 2019-05-11 MED ORDER — TICAGRELOR 90 MG PO TABS: 90.0000 mg | ORAL_TABLET | Freq: Two times a day (BID) | ORAL | 1 refills | Status: DC

## 2019-05-11 NOTE — Telephone Encounter (Signed)
Advised of recommendations and clarification given as to each change. Pt agreeable to all changes except Lipitor.  He refuses to switch this medication (although it was stressed that we don't feel that Plavix is the culprit).   He would like to try the other 2 medication changes and give it 3 months to see how things go. He will address changing Lipitor, if needed, when he follows up w/ Dr. [name] in May.   Clarified w/ pharmacist ok to remain on Lipitor while on Brilinta. Rx sent to [facility] per pt request. He understands to start the Eliquis 24 hr after his last dose of Xarelto. He understands to start Brilinta 24 hours after his last dose of Plavix. Instructions sent to pt via mychart along with new medication information.  Will forward to Dr. [name] & [facility] for their [place].

## 2019-05-19 IMAGING — DX DG CHEST 2V
2 series · 2 of 2 positions shown · non-contrast
Comparison: CT chest abdomen pelvis 166614 and chest x-ray of
10/16/1998

CLINICAL DATA: Follow-up CT of chest, abdomen and pelvis, placement
of nipple markers

EXAM:
CHEST - 2 VIEW

[chest pa]
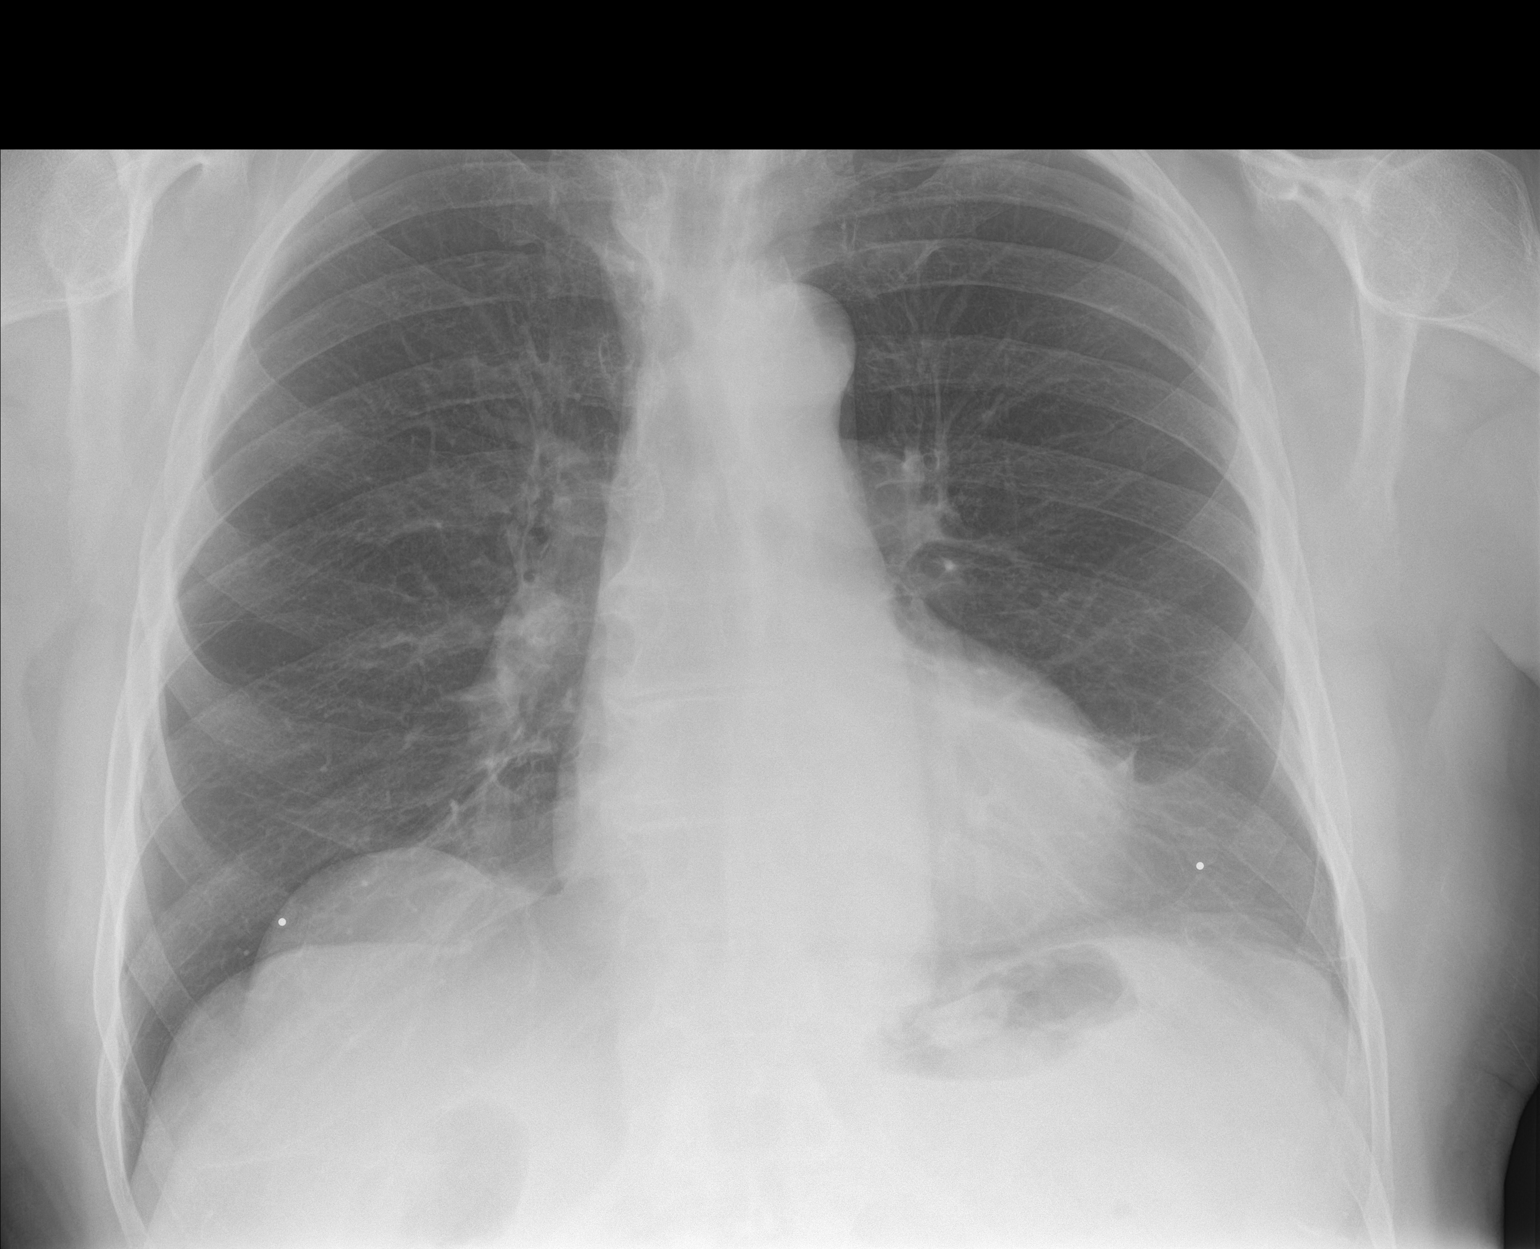

[chest lat]
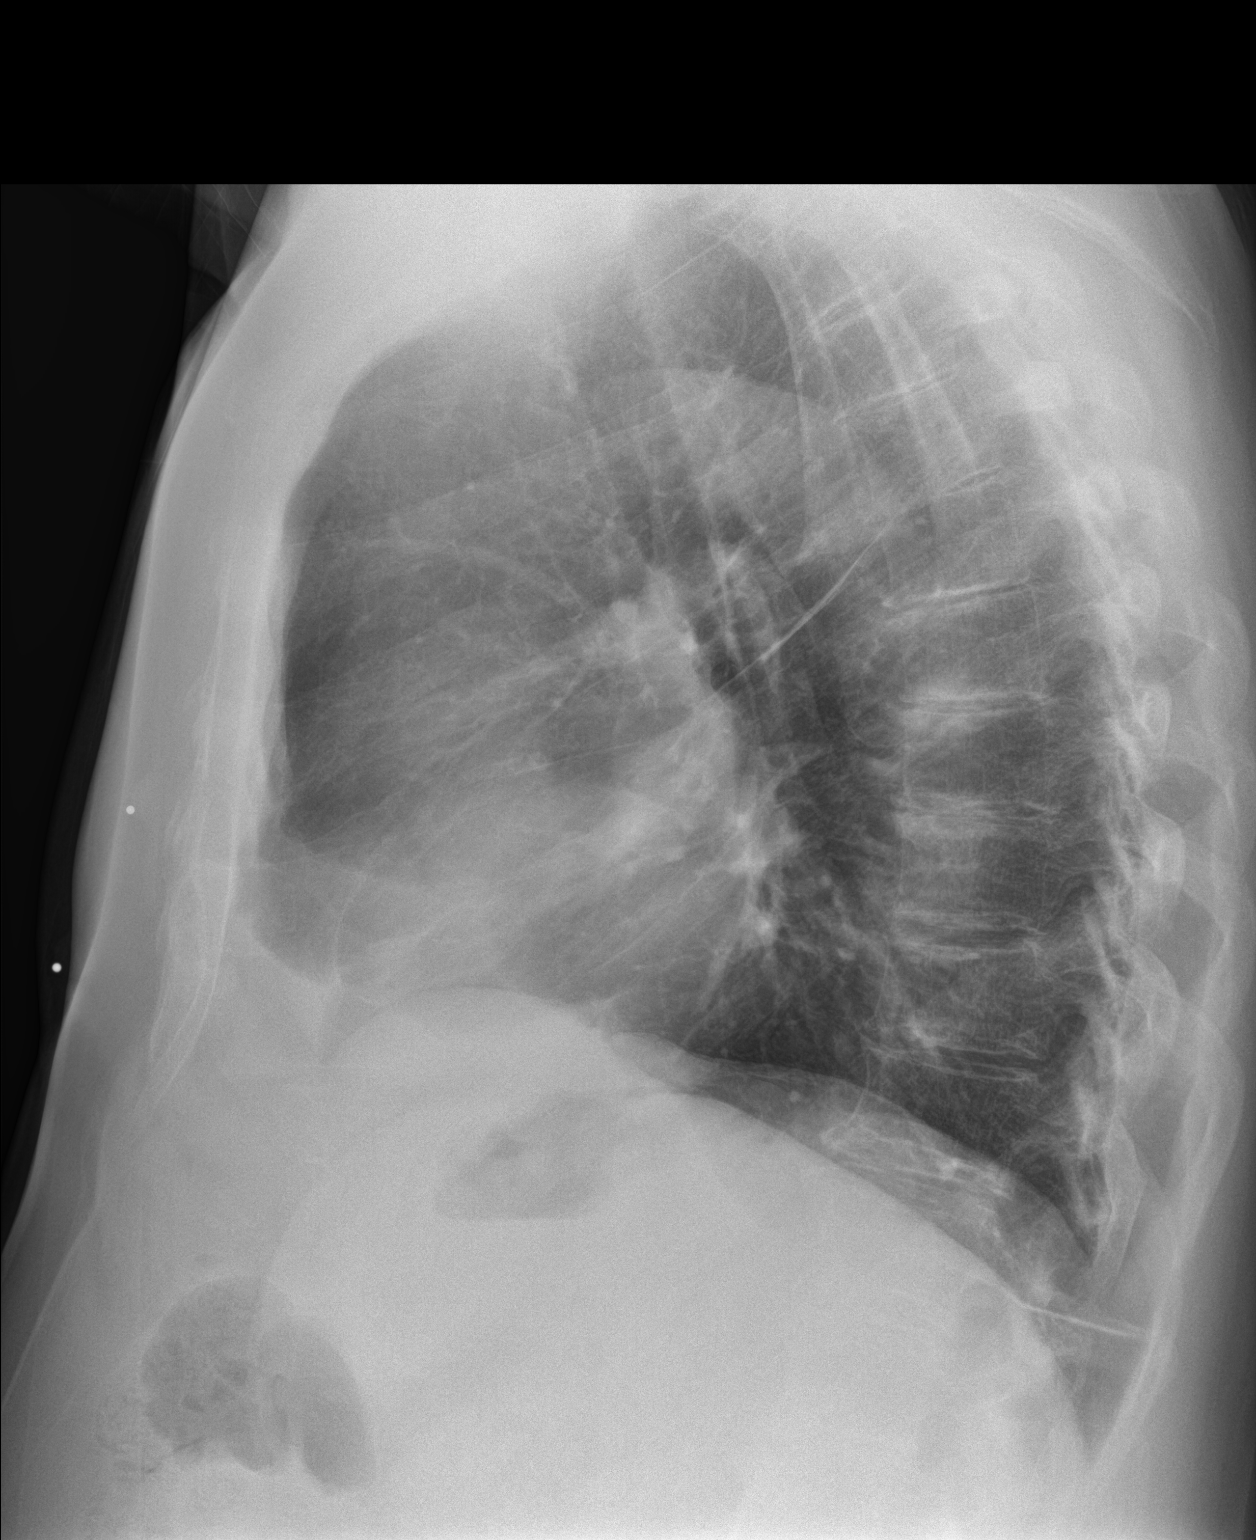

[2 of 2 positions shown; findings below may reference images not displayed]

FINDINGS: Nipple markers are present. No lung nodule is seen. The lungs are
clear and slightly hyperaerated. Linear scarring is again noted
posteriorly at the lung base on the lateral view. No pneumonia or
effusion is seen. The heart is mildly enlarged and stable. There are
degenerative changes in the mid to lower thoracic spine.
IMPRESSION: 1. No lung nodule is seen with nipple markers placed.
2. No change in hyper aeration and mild cardiomegaly.

## 2019-05-20 ENCOUNTER — Telehealth: Payer: Self-pay | Admitting: Internal Medicine

## 2019-05-20 NOTE — Telephone Encounter (Signed)
Called and spoke with the pt  He states that he has been having some pain in his feet, esp at night  He thinks that the symbicort could be contributing to this  He has tried a few nights skipping the pm dose of the symbicort and this seemed to help He wants to know if he can try switching to another inhaler completely to see if this will help solve the problem  Please advise, thanks

## 2019-05-20 NOTE — Telephone Encounter (Signed)
Did his breathing get worse when he skipped night-time Symbicort??   For some people, once daily is sufficient.  Suggest he try 2 samples of Breo 100    Inhale 1 puff then rinse mouth, once daily. Try this instead of Symbicort for comparison.

## 2019-05-20 NOTE — Telephone Encounter (Signed)
Spoke with patient regarding prior message. Advised patient I was going to leave a 2 samples of Breo 100 in the foyer for him to pick up. Gave patient direction's on how to use Breo 100. Patient's voice was understanding. Nothing else was further needed.

## 2019-06-10 ENCOUNTER — Telehealth: Payer: Self-pay | Admitting: Cardiovascular Disease

## 2019-06-10 ENCOUNTER — Other Ambulatory Visit: Payer: Self-pay

## 2019-06-10 MED ORDER — EZETIMIBE 10 MG PO TABS: 10.0000 mg | ORAL_TABLET | Freq: Every day | ORAL | 3 refills | Status: DC

## 2019-06-10 NOTE — Telephone Encounter (Signed)
New Message   *STAT* If patient is at the pharmacy, call can be transferred to refill team.   1. Which medications need to be refilled? (please list name of each medication and dose if known)  ezetimibe (ZETIA) 10 MG tablet  2. Which pharmacy/location (including street and city if local pharmacy) is medication to be sent to? [place], [place]  3. Do they need a 30 day or 90 day supply? 90 day

## 2019-06-13 ENCOUNTER — Other Ambulatory Visit: Payer: Self-pay

## 2019-06-13 ENCOUNTER — Ambulatory Visit (INDEPENDENT_AMBULATORY_CARE_PROVIDER_SITE_OTHER): Payer: Medicare Other | Admitting: Internal Medicine

## 2019-06-13 ENCOUNTER — Ambulatory Visit (INDEPENDENT_AMBULATORY_CARE_PROVIDER_SITE_OTHER): Payer: Medicare Other

## 2019-06-13 ENCOUNTER — Encounter: Payer: Self-pay | Admitting: Internal Medicine

## 2019-06-13 VITALS — BP 122/72 | HR 67 | Temp 97.0°F | Ht 71.0 in | Wt 216.2 lb

## 2019-06-13 DIAGNOSIS — G609 Hereditary and idiopathic neuropathy, unspecified: Secondary | ICD-10-CM | POA: Diagnosis not present

## 2019-06-13 DIAGNOSIS — I2510 Atherosclerotic heart disease of native coronary artery without angina pectoris: Secondary | ICD-10-CM | POA: Diagnosis not present

## 2019-06-13 DIAGNOSIS — J449 Chronic obstructive pulmonary disease, unspecified: Secondary | ICD-10-CM | POA: Diagnosis not present

## 2019-06-13 DIAGNOSIS — Z9861 Coronary angioplasty status: Secondary | ICD-10-CM | POA: Diagnosis not present

## 2019-06-13 DIAGNOSIS — G629 Polyneuropathy, unspecified: Secondary | ICD-10-CM | POA: Insufficient documentation

## 2019-06-13 MED ORDER — BREZTRI AEROSPHERE 160-9-4.8 MCG/ACT IN AERO: 2.0000 | INHALATION_SPRAY | Freq: Two times a day (BID) | RESPIRATORY_TRACT | 0 refills | Status: DC

## 2019-06-13 NOTE — Progress Notes (Signed)
brez

## 2019-06-13 NOTE — Patient Instructions (Signed)
Order- CXR    Dx COPD mixed type  Sample x 2 Breztri inhaler     Inhale 2 puffs, then rinse mouth, twice daily   Instead of Symbicort If you like this, call us for prescription

## 2019-06-13 NOTE — Assessment & Plan Note (Signed)
Significantly impacting sleep- very positional. Hopefully gabapentin will help. He will discuss again w PCP.

## 2019-06-13 NOTE — Assessment & Plan Note (Signed)
Felt better after PCI. Cardiology following.

## 2019-06-13 NOTE — Assessment & Plan Note (Signed)
Dry cough unchanged, likely chronic broncchitis but can't excclude med effect. Plan- try Breztri, CXR

## 2019-06-13 NOTE — Progress Notes (Signed)
Subjective:    Patient ID: Theodore [name], male    DOB: October 17, 1934, 85 y.o.   MRN: [id number] HPI male never smoker followed for COPD/bronchitis, complicated by HBP, peripheral edema, GERD, A. Fib/ ablation, CAD, PE/DVT,  PFT 2011 FEV1/FVC 0.65.    PFT 11/30/13- moderate obstruction, insignificant response to bronchodilator, airtrapping, minimal diffusion defect. FEV1 2.21/ 70%, FEV1/FVC 0.63, TLC 97%, DLCO 78%. EKG 03/19/2018-sinus bradycardia with frequent multifocal PVCs/ventricular trigeminy PFT 10/15/2018- Moderate obstruction with response to BD, Airtrapping, Normal Diffusion (Chronic bronchitis pattern) -----------------------------------------------------------------------------------------   12/13/2018- 85 year old male never smoker followed for COPD/bronchitis, complicated by HBP, peripheral edema, GERD, A. Fib/ Xarelto, PE/DVT (2019), CAD/ stents, Spiriva, Xarelto Symbicort 160/ spacer,  -----2 month f/u Symbicort device works better than Anoro- coughs productively clear phelgm after morning use especially. Stable DOE, stairs are hard. Little change. Covid discussion. PFT 10/15/2018- Moderate obstruction with response to BD, Airtrapping, Normal Diffusion (Chronic bronchitis pattern)  06/13/19- 85 year old male never smoker followed for COPD/bronchitis, complicated by HBP, peripheral edema, GERD, A. Fib/ Xarelto, PE/DVT (2019), CAD Spiriva, Xarelto, Symbicort 160/ spacer,  -----f/u COPD Mixed type.  He thought Symbicort at night might be causing foot pain and dropped the PM dose. We gave him samples of Breo 100 to try qAM instead, for comparison. Cardiac cath for unstable angina had shown severe CAD> successfull PCI/ stents in December.  Peripheral neuropathy with burning feet if lies in bed at night- trying gabapentin. Had 2 Phizer Covax Nagging cough, white phlegm, unchanged. Didn't like Breo powder. Wants to try Judithann Sauger- his insurance covers now- discussed.    ROS-see HPI + =  positive Constitutional:   No-   weight loss, night sweats, fevers, chills, fatigue, lassitude. HEENT:   No-  headaches, difficulty swallowing, tooth/dental problems, sore throat,       No-  sneezing, itching, ear ache, nasal congestion, post nasal drip,  CV:  No-   chest pain, orthopnea, PND, swelling in lower extremities, anasarca, dizziness, +palpitations Resp: + shortness of breath with exertion or at rest.              +  productive cough,  non-productive cough,  No- coughing up of blood.              No-   change in color of mucus.  +wheezing.   Skin: No-   rash or lesions. GI:  No-   heartburn, indigestion, abdominal pain, nausea, vomiting,  GU:  MS:  No-   joint pain or swelling.  Neuro-     nothing unusual Psych:  No- change in mood or affect. No depression or anxiety.  No memory loss.  OBJ- Physical Exam General- Alert, Oriented, Affect-appropriate, Distress- none acute, looks fit and trim Skin- +ecchymoses on arms  Lymphadenopathy- none Head- atraumatic            Eyes- Gross vision intact, PERRLA, conjunctivae and secretions clear            Ears- Hearing, canals-normal            Nose- Clear, no-Septal dev, mucus, polyps, erosion, perforation             Throat- Mallampati II , mucosa clear , drainage- none, tonsils- atrophic,  Neck- flexible , trachea midline, no stridor , thyroid nl, carotid no bruit Chest - symmetrical excursion , unlabored           Heart/CV- RRR , no murmur , no gallop  , no rub, nl s1 s2                           -  JVD- none , edema- none, stasis changes- none, varices- none           Lung-  Clear, wheeze- none, cough- none here, dullness-none, rub- none           Chest wall-  Abd-  Br/ Gen/ Rectal- Not done, not indicated Extrem- cyanosis- none, clubbing, none, atrophy- none, strength- nl Neuro- grossly intact to observation

## 2019-06-23 ENCOUNTER — Encounter: Payer: Self-pay | Admitting: Family Medicine

## 2019-06-23 NOTE — Telephone Encounter (Signed)
Did you want to send in amitriptyline or should I schedule an OV with another provider?

## 2019-06-24 MED ORDER — NORTRIPTYLINE HCL 25 MG PO CAPS: 25.0000 mg | ORAL_CAPSULE | Freq: Every day | ORAL | 1 refills | Status: DC

## 2019-06-24 NOTE — Addendum Note (Signed)
Addended by: [name] on: 06/24/2019 10:20 AM   Modules accepted: Orders

## 2019-06-28 ENCOUNTER — Telehealth (INDEPENDENT_AMBULATORY_CARE_PROVIDER_SITE_OTHER): Payer: Medicare Other | Admitting: Cardiovascular Disease

## 2019-06-28 ENCOUNTER — Encounter: Payer: Self-pay | Admitting: Cardiovascular Disease

## 2019-06-28 ENCOUNTER — Telehealth: Payer: Self-pay | Admitting: Radiology

## 2019-06-28 VITALS — BP 130/75 | HR 58 | Ht 71.0 in | Wt 217.0 lb

## 2019-06-28 DIAGNOSIS — Z9861 Coronary angioplasty status: Secondary | ICD-10-CM

## 2019-06-28 DIAGNOSIS — I10 Essential (primary) hypertension: Secondary | ICD-10-CM | POA: Diagnosis not present

## 2019-06-28 DIAGNOSIS — I480 Paroxysmal atrial fibrillation: Secondary | ICD-10-CM | POA: Diagnosis not present

## 2019-06-28 DIAGNOSIS — I493 Ventricular premature depolarization: Secondary | ICD-10-CM | POA: Diagnosis not present

## 2019-06-28 DIAGNOSIS — I2510 Atherosclerotic heart disease of native coronary artery without angina pectoris: Secondary | ICD-10-CM | POA: Diagnosis not present

## 2019-06-28 MED ORDER — VALSARTAN 320 MG PO TABS: 320.0000 mg | ORAL_TABLET | Freq: Every day | ORAL | 3 refills | Status: DC

## 2019-06-28 MED ORDER — RIVAROXABAN 20 MG PO TABS
20.0000 mg | ORAL_TABLET | Freq: Every day | ORAL | 3 refills | Status: DC
Start: 2019-06-28 — End: 2020-05-01

## 2019-06-28 MED ORDER — CLOPIDOGREL BISULFATE 75 MG PO TABS
75.0000 mg | ORAL_TABLET | Freq: Every day | ORAL | 3 refills | Status: DC
Start: 2019-06-28 — End: 2020-03-28

## 2019-06-28 NOTE — Progress Notes (Signed)
Virtual Visit via Video Note   This visit type was conducted due to national recommendations for restrictions regarding the COVID-19 Pandemic (e.g. social distancing) in an effort to limit this patient's exposure and mitigate transmission in our community.  Due to his co-morbid illnesses, this patient is at least at moderate risk for complications without adequate follow up.  This format is felt to be most appropriate for this patient at this time.  All issues noted in this document were discussed and addressed.  A limited physical exam was performed with this format.  Please refer to the patient's chart for his consent to telehealth for [facility].   The patient was identified using 2 identifiers.  Date:  06/28/2019   ID:  Theodore [name], DOB 1934/11/04, MRN [id number]  Patient Location: Home Provider Location: Home  PCP:  [name], MD  Cardiologist:  Skeet Latch, MD  Electrophysiologist:  Constance Haw, MD   Evaluation Performed:  Follow-Up Visit  Chief Complaint:  Atrial fibrillation, neuropathy  History of Present Illness:    The patient does not have symptoms concerning for COVID-19 infection (fever, chills, cough, or new shortness of breath).    Theodore [name] is a 85 y.o. male with CAD s/p LAD PCI, chronic diastolic heart failure, hypertension, hyperlipidemia, persistent atrial fibrillation s/p ablation, asthma and Rheumatic fever who presents for follow up.  Theodore [name] was previously a patient of Dr. Mare Ferrari.  Theodore [name] was diagnosed with atrial fibrillation in 2014.  He was initially on Pradaxa and switched to Xarelto.  He underwent ablation on 12/19/15 with Dr. [name] and has remained in sinus rhythm.  He had an episode of chest pain 08/2014 and had a Lexiscan Myoview 08/31/14 that showed an old inferior scar with peri-infarct ischemia and LVEF 49%.  He then had a LHC that revealed 70% ostial LAD, 50% prox LAD, 30% OM1 and 35% RCA.  He was managed  medically.  He continued to report exertional symptoms despite medical management. He had a LHC with Dr. Ellyn Hack on 09/28/15 that again revealed a 70% ostial-proximal LAD lesion.  Dr. Ellyn Hack performed FFR on that lesion and there was no significant change.  The left ventriculogram performed at that time revealed LVEF 35-45%.  This subsequently improved to 50% on 03/2018.  He again underwent left heart cath on 01/10/2019 and there was progression of his LAD lesion.  He underwent atherectomy and PCI with drug-eluting stent placement 01/11/2019.  Since then he has noted an improvement in his breathing.  He is back to playing golf a couple days per week.  He continues to have some exertional dyspnea but it has improved.  He has no lower extremity edema, orthopnea, or PND.   Atorvastatin was increased to 80 mg but he noted leg pain.  He was seen in the lipid clinic and started back on atorvastatin 40mg  and zetia 10 mg.    Theodore [name] underwent L knee replacement on  02/09/17.  Xarelto was held for 2 days prior to surgery and resumed post-operatively.  He developed bleeding at the incision site so Xarelto was again held and he was switched to aspirin.  He subsequently developed a left lower extremity DVT and pulmonary embolism.  Xarelto was resumed.  Since his cath he has been struggling with peripheral neuropathy.  It started a couple weeks after the cath and he thought it was related to Plavix.  He saw Dr. [name] and switched from clopidogrel and and Xarelto to ticagrelor  and Eliquis.  His neuropathy did not improve.  He wants to switch back.  He is followed up with his PCP and was started on gabapentin and nortriptyline, which seems to be helping a little bit.  He has only been on the nortriptyline for 2 days.  He checks his BP regularly and notices that he is having more skipped beats.  Three weeks ago he was out of rhythm for about a week.  He has more exertional dyspnea at that time.  His heart rate was controlled  and at times slow.  It was in the 40s.  He is unsure whether it was actually not low or if he is that she was not reading it accurately.  He seems to be having more episodes of atrial fibrillation lately.  Past Medical History:  Diagnosis Date  . CAP (community acquired pneumonia) 01/19/2018  . History of arthritis   . History of rheumatic fever   . Hypercholesterolemia   . Hypertension   . Nocturia   . PAF (paroxysmal atrial fibrillation) [facility]) January 2013   Placed on Pradaxa. Did not require cardioversion; spontaneously converted  . Peripheral edema   . Right bundle branch block   . SOB (shortness of breath)   . Tendonitis of elbow, left     Past Surgical History:  Procedure Laterality Date  . ATRIAL FIBRILLATION ABLATION  12/19/2015  . BACK SURGERY  12/24/09   fusion C3-C4  . BACK SURGERY  2010   fusion L4-L5  . CARDIAC CATHETERIZATION  2009   NONOBSTRUCTIVE ATHERSCLEROTIC CORONARY DISEASE AND NORMAL  LV FUNCTION  . CARDIAC CATHETERIZATION N/A 09/08/2014   Procedure: Left Heart Cath and Coronary Angiography;  Surgeon: Belva Crome, MD;  Location: [place] CV LAB;  Service: Cardiovascular;  Laterality: N/A;  . CARDIAC CATHETERIZATION N/A 09/28/2015   Procedure: Left Heart Cath and Coronary Angiography;  Surgeon: Leonie Man, MD;  Location: [place] CV LAB;  Service: Cardiovascular;  Laterality: N/A;  . CARDIAC CATHETERIZATION N/A 09/28/2015   Procedure: Intravascular Pressure Wire/FFR Study;  Surgeon: Leonie Man, MD;  Location: [place] CV LAB;  Service: Cardiovascular;  Laterality: N/A;  . CARDIOVERSION N/A 10/23/2015   Procedure: CARDIOVERSION;  Surgeon: [name], MD;  Location: [facility] ENDOSCOPY;  Service: Cardiovascular;  Laterality: N/A;  . COLONOSCOPY  2013   per patient, rpt 5 yrs  . COLONOSCOPY WITH PROPOFOL N/A 12/02/2018   TAx2, HP, diverticulosis [name], MD)  . CORONARY ATHERECTOMY N/A 01/11/2019   Procedure: CORONARY ATHERECTOMY;   Surgeon: Burnell [name], MD;  Location: [place] CV LAB;  Service: Cardiovascular;  Laterality: N/A;  . CORONARY STENT INTERVENTION N/A 01/11/2019   Procedure: CORONARY STENT INTERVENTION;  Surgeon: Burnell [name], MD;  Location: [place] CV LAB;  Service: Cardiovascular;  Laterality: N/A;  . ELECTROPHYSIOLOGIC STUDY N/A 12/19/2015   Procedure: Atrial Fibrillation Ablation;  Surgeon: [name], MD;  Location: [place] CV LAB;  Service: Cardiovascular;  Laterality: N/A;  . ESOPHAGOGASTRODUODENOSCOPY (EGD) WITH PROPOFOL N/A 12/02/2018   medium HH [name], MD)  . INTRAVASCULAR PRESSURE WIRE/FFR STUDY N/A 01/10/2019   Procedure: INTRAVASCULAR PRESSURE WIRE/FFR STUDY;  Surgeon: Leonie Man, MD;  Location: [place] CV LAB;  Service: Cardiovascular;  Laterality: N/A;  . KNEE ARTHROSCOPY Left 03/2016   Dr. [name]  . LEFT HEART CATH AND CORONARY ANGIOGRAPHY N/A 01/10/2019   Procedure: LEFT HEART CATH AND CORONARY ANGIOGRAPHY;  Surgeon: Leonie Man, MD;  Location: [facility]  INVASIVE CV LAB;  Service: Cardiovascular;  Laterality: N/A;  . PATELLAR TENDON REPAIR Left 2008  . POLYPECTOMY  12/02/2018   Procedure: POLYPECTOMY;  Surgeon: [name], MD;  Location: [facility] ENDOSCOPY;  Service: Endoscopy;;  . REPLACEMENT TOTAL KNEE Right 2006  . TOTAL KNEE ARTHROPLASTY Left 02/09/2017   Procedure: LEFT TOTAL KNEE ARTHROPLASTY, EXCISION LEFT DISTAL THIGH MASS;  Surgeon: [name], MD;  Location: [facility] ORS;  Service: Orthopedics;  Laterality: Left;  90 mins  . TRICEPS TENDON REPAIR Left 2013     Current Outpatient Medications  Medication Sig Dispense Refill  . acetaminophen (TYLENOL) 500 MG tablet Take 1 tablet (500 mg total) by mouth 3 (three) times daily as needed. 30 tablet 0  . Ascorbic Acid (VITAMIN C PO) Take 500 mg by mouth daily.     [name] atorvastatin (LIPITOR) 40 MG tablet Take 1 tablet (40 mg total) by mouth daily. 90 tablet 3  . budesonide-formoterol  (SYMBICORT) 160-4.5 MCG/ACT inhaler Inhale 2 puffs into the lungs 2 (two) times daily. 3 Inhaler 3  . Cholecalciferol (VITAMIN D3) 2000 units TABS Take 2,000 Units by mouth daily.    [name] ezetimibe (ZETIA) 10 MG tablet Take 1 tablet (10 mg total) by mouth daily. 90 tablet 3  . furosemide (LASIX) 20 MG tablet Take 1 tablet (20 mg total) by mouth daily. 90 tablet 3  . gabapentin (NEURONTIN) 300 MG capsule Take 600 mg by mouth at bedtime.     . metoprolol succinate (TOPROL-XL) 50 MG 24 hr tablet Take 3 tablets (150 mg total) by mouth daily. Take with or immediately following a meal. 270 tablet 2  . nitroGLYCERIN (NITROSTAT) 0.4 MG SL tablet Place 1 tablet (0.4 mg total) under the tongue every 5 (five) minutes as needed. 25 tablet 2  . nortriptyline (PAMELOR) 25 MG capsule Take 1 capsule (25 mg total) by mouth at bedtime. 30 capsule 1  . pantoprazole (PROTONIX) 40 MG tablet Take 1 tablet (40 mg total) by mouth daily. 90 tablet 3  . potassium chloride (KLOR-CON) 10 MEQ tablet TAKE 1 TABLET TWICE A DAY 180 tablet 3  . Spacer/Aero-Holding Chambers (AEROCHAMBER MV) inhaler Use as instructed 1 each 0  . vitamin B-12 (CYANOCOBALAMIN) 1000 MCG tablet Take 1 tablet (1,000 mcg total) by mouth daily.    . clopidogrel (PLAVIX) 75 MG tablet Take 1 tablet (75 mg total) by mouth daily. 90 tablet 3  . rivaroxaban (XARELTO) 20 MG TABS tablet Take 1 tablet (20 mg total) by mouth daily with supper. 90 tablet 3  . valsartan (DIOVAN) 320 MG tablet Take 1 tablet (320 mg total) by mouth daily. 90 tablet 3   No current facility-administered medications for this visit.    Allergies:   Penicillins, Amlodipine, Lisinopril, Mevacor [lovastatin], Plavix [clopidogrel], Vicodin [hydrocodone-acetaminophen], Zocor [simvastatin], and Oseltamivir phosphate    Social History:  The patient  reports that he has never smoked. He has never used smokeless tobacco. He reports current alcohol use of about 14.0 standard drinks of alcohol per  week. He reports that he does not use drugs.   Family History:  The patient's family history includes Asthma in his sister; COPD in his brother and father; Heart attack in his father; Heart disease in his brother; Hypertension in his father.    ROS:  Please see the history of present illness.   Otherwise, review of systems are positive for none.   All other systems are reviewed and negative.    PHYSICAL EXAM: BP 130/75  Pulse (!) 58   Ht 5\' 11"  (1.803 m)   Wt 217 lb (98.4 kg)   SpO2 96%   BMI 30.27 kg/m  GENERAL: Well-appearing.  No acute distress. HEENT: Pupils equal round.  Oral mucosa unremarkable NECK:  No jugular venous distention, no visible thyromegaly EXT:  No edema, no cyanosis no clubbing SKIN:  No rashes no nodules NEURO:  Speech fluent.  Cranial nerves grossly intact.  Moves all 4 extremities freely PSYCH:  Cognitively intact, oriented to person place and time   EKG:  EKG is not ordered today. 10/30/15: Sinus rhythm.  Rate 60 bpm.  RBBB.  LAD.  The ekg ordered 09/25/15 demonstrates atrial fibrillation.  Rate 84 bpm.   Echo 03/25/18: IMPRESSIONS    1. The left ventricle has a visually estimated ejection fraction of of 50%. The cavity size was normal. There is mildly increased left ventricular wall thickness. Left ventricular diastolic Doppler parameters are consistent with impaired relaxation Left  ventricular diffuse hypokinesis.  2. The right ventricle has mildly reduced systolic function. The cavity was mildly enlarged. There is no increase in right ventricular wall thickness.  3. Left atrial size was mildly dilated.  4. Right atrial size was mildly dilated.  5. The mitral valve is normal in structure. No evidence of mitral valve stenosis. Trivial regurgitation.  6. The tricuspid valve is normal in structure.  7. The aortic valve is tricuspid There is mild calcification of the aortic valve. Aortic valve regurgitation is trivial by color flow Doppler. No  stenosis.  8. The pulmonic valve was normal in structure.  9. There is mild dilatation of the aortic root measuring 38 mm. 10. Right atrial pressure is estimated at 3 mmHg. 11. No complete TR doppler jet so unable to estimate PA systolic pressure.  Lexiscan Myoview 10/17/15:  The left ventricular ejection fraction is moderately decreased (30-44%).  Nuclear stress EF: 39%.  There was no ST segment deviation noted during stress.  There is a small defect of moderate severity present in the apical lateral and apex location and medium sized defect of moderate severity in the basal inferior, mild inferior and apical inferior location. These defects are fixed and consistent with scar. No ischemia noted.  This is an intermediate risk study due to LV dysfunction.  LHC 01/10/19:  Prox LAD lesion is 80% stenosed. Prox LAD to Mid LAD lesion is 60% stenosed with 50% stenosed side branch in 2nd Diag.  Dist LAD lesion is 45% stenosed.  Ost Cx to Prox Cx lesion is 10% stenosed. 1st Mrg lesion is 30% stenosed. Prox Cx to Mid Cx lesion is 30% stenosed.  -----------  Dist RCA lesion is 30% stenosed with 30% stenosed side branch in RPDA.  -----------  The left ventricular systolic function is normal. The left ventricular ejection fraction is 55-65% by visual estimate.  Diagnostic Dominance: Right   PCI 01/11/19:  Prox LAD to Mid LAD lesion is 80% stenosed.  A drug-eluting stent was successfully placed using a STENT RESOLUTE ONYX 3.5X18.  Post intervention, there is a 0% residual stenosis.  Mid LAD lesion is 70% stenosed.  A drug-eluting stent was successfully placed using a STENT RESOLUTE ONYX 2.75X26.  Post intervention, there is a 0% residual stenosis.   1. Severe, heavily calcified stenoses in the proximal and mid LAD 2. Successful orbital atherectomy of the proximal and mid LAD 3. Successful placement of 2 drug eluting stents in the proximal and mid LAD  (overlapping)  Recommendations: Continue ASA and  Plavix for at least six months.   Recent Labs: 01/18/2019: ALT 20; BUN 17; Creatinine, Ser 1.13; Potassium 4.2; Sodium 133 03/09/2019: Hemoglobin 14.0; Platelets 168.0; TSH 1.76    Lipid Panel    Component Value Date/Time   CHOL 141 01/18/2019 0756   CHOL 121 07/29/2016 0822   TRIG 70.0 01/18/2019 0756   HDL 59.10 01/18/2019 0756   HDL 56 07/29/2016 0822   CHOLHDL 2 01/18/2019 0756   VLDL 14.0 01/18/2019 0756   LDLCALC 68 01/18/2019 0756   LDLCALC 56 07/29/2016 0822      Wt Readings from Last 3 Encounters:  06/28/19 217 lb (98.4 kg)  06/13/19 216 lb 3.2 oz (98.1 kg)  04/26/19 217 lb 12.8 oz (98.8 kg)      ASSESSMENT AND PLAN:  # Paroxysmal atrial fibrillation: Lately Theodore [name] has been struggling with recurrent atrial fibrillation.  Prior to that he was doing well since his ablation 12/2015.  Continue metoprolol and switch Eliquis back to Xarelto per patient request.  We will have him wear a 14-day monitor to ensure that this is truly atrial fibrillation and not recurrent PVCs.  Will reach out to Dr. [name].    This patients CHA2DS2-VASc Score and unadjusted Ischemic Stroke Rate (% per year) is equal to 7.2 % stroke rate/year from a score of 5  Above score calculated as 1 point each if present [CHF, HTN, DM, Vascular=MI/PAD/Aortic Plaque, Age if 65-74, or Male] Above score calculated as 2 points each if present [Age > 75, or Stroke/TIA/TE]   # PVCs: He denies recent palpitations.  Continue metoprolol.  # Pulmonary embolism: # Pulmonary hypertension: # L LE DVT:  Occurred in the setting of knee replacement.  Resolved.  He is on Xarelto for atrial fibrillation.    # CAD:  # Hyperlipidemia:   Mr. Bach is doing better after his LAD PCI.  He had peripheral neuropathy that he thought was attributable to Plavix it was switched to ticagrelor.  However it did not improve.  We will switch back to Plavix.  Continue Plavix and  Xarelto.  He can be on Xarelto only starting 01/11/2020.  Continue atorvastatin, Zetia, and metoprolol.  LDL was at goal at 12/20.  # Chronic systolic and diastolic heart failure: # Hypertension: LVEF improved to 50% on 03/2018.  Blood pressure has been running a little high at home.  We will switch losartan to valsartan 320 mg daily.  Continue metoprolol.  Avoid carvedilol given his lung disease.  # Chronic bronchitis: Management per Dr. Annamaria Boots.    COVID-19 Education: The signs and symptoms of COVID-19 were discussed with the patient and how to seek care for testing (follow up with PCP or arrange E-visit).  The importance of social distancing was discussed today.  Time:   Today, I have spent 22 minutes with the patient with telehealth technology discussing the above problems.   Current medicines are reviewed at length with the patient today.  The patient does not have concerns regarding medicines.  The following changes have been made: none  Labs/ tests ordered today include:   Orders Placed This Encounter  Procedures  . CARDIAC EVENT MONITOR     Disposition:   FU with Theodore Hayhurst C. [name], MD, [facility] in 3 months.      Signed, Airiel Oblinger C. [name], MD, [facility]  06/28/2019 10:12 AM    [name]

## 2019-06-28 NOTE — Telephone Encounter (Signed)
Enrolled patient for a 14 day [employer] Event monitor to be mailed to patients home.  

## 2019-06-28 NOTE — Patient Instructions (Addendum)
He is OK to go back to plavix and Xarelto 20mg . Needs 90 day mail order. Also, switch losartan to valsartan 320mg . f/u 3 months   Medication Instructions:  STOP ELIQUIS  START XARELTO 20 MG DAILY, START 12 HOURS AFTER YOUR LAST DOSE OF ELIQUIS   STOP LOSARTAN   START VALSARTAN 320 MG DAILY   STOP BRILINTA   START PLAVIX 75 MG DAILY, TAKE 8 TABLETS 24 HOUR AFTER YOUR LAST DOSE OF BRILINTA   *If you need a refill on your cardiac medications before your next appointment, please call your pharmacy*   Lab Work: NONE   Testing/Procedures: Your physician has recommended that you wear an event monitor. Event monitors are medical devices that record the heart's electrical activity. Doctors most often [place] these monitors to diagnose arrhythmias. Arrhythmias are problems with the speed or rhythm of the heartbeat. The monitor is a small, portable device. You can wear one while you do your normal daily activities. This is usually used to diagnose what is causing palpitations/syncope (passing out). 61 DAY MONITOR, THIS WILL BE MAILED TO YOU   Follow-Up: At [facility], you and your health needs are our priority.  As part of our continuing mission to provide you with exceptional heart care, we have created designated Provider Care Teams.  These Care Teams include your primary Cardiologist (physician) and Advanced Practice Providers (APPs -  Physician Assistants and Nurse Practitioners) who all work together to provide you with the care you need, when you need it.  We recommend signing up for the patient portal called "MyChart".  Sign up information is provided on this After Visit Summary.  MyChart is used to connect with patients for Virtual Visits (Telemedicine).  Patients are able to view lab/test results, encounter notes, upcoming appointments, etc.  Non-urgent messages can be sent to your provider as well.   To learn more about what you can do with MyChart, go to [email or web address].     Your physician recommends that you schedule a follow-up appointment in: 09/30/2019 AT 9:00 AM   Your physician recommends that you schedule a follow-up appointment in: DR Alden   Other Instructions  [employer] Cardiac Event Monitor Instructions Your physician has requested you wear your cardiac event monitor for _14__ days, (1-30). [employer] may call or text to confirm a shipping address. The monitor will be sent to a land address via UPS. [employer] will not ship a monitor to a PO BOX. It typically takes 3-5 days to receive your monitor after it has been enrolled. [employer] will assist with USPS tracking if your package is delayed. The telephone number for [employer] is [phone]. Once you have received your monitor, please review the enclosed instructions. Instruction tutorials can also be viewed under help and settings on the enclosed cell phone. Your monitor has already been registered assigning a specific monitor serial # to you.  Applying the monitor Remove cell phone from case and turn it on. The cell phone works as Dealer and needs to be within [employer] of you at all times. The cell phone will need to be charged on a daily basis. We recommend you plug the cell phone into the enclosed charger at your bedside table every night.  Monitor batteries: You will receive two monitor batteries labelled #1 and #2. These are your recorders. Plug battery #2 onto the second connection on the enclosed charger. Keep one battery on the charger at all times. This will keep the monitor battery deactivated.  It will also keep it fully charged for when you need to switch your monitor batteries. A small light will be blinking on the battery emblem when it is charging. The light on the battery emblem will remain on when the battery is fully charged.  Open package of a Monitor strip. Insert battery #1 into black hood on strip and gently squeeze monitor  battery onto connection as indicated in instruction booklet. Set aside while preparing skin.  Choose location for your strip, vertical or horizontal, as indicated in the instruction booklet. Shave to remove all hair from location. There cannot be any lotions, oils, powders, or colognes on skin where monitor is to be applied. Wipe skin clean with enclosed Saline wipe. Dry skin completely.  Peel paper labeled #1 off the back of the Monitor strip exposing the adhesive. Place the monitor on the chest in the vertical or horizontal position shown in the instruction booklet. One arrow on the monitor strip must be pointing upward. Carefully remove paper labeled #2, attaching remainder of strip to your skin. Try not to create any folds or wrinkles in the strip as you apply it.  Firmly press and release the circle in the center of the monitor battery. You will hear a small beep. This is turning the monitor battery on. The heart emblem on the monitor battery will light up every 5 seconds if the monitor battery in turned on and connected to the patient securely. Do not push and hold the circle down as this turns the monitor battery off. The cell phone will locate the monitor battery. A screen will appear on the cell phone checking the connection of your monitor strip. This may read poor connection initially but change to good connection within the next minute. Once your monitor accepts the connection you will hear a series of 3 beeps followed by a climbing crescendo of beeps. A screen will appear on the cell phone showing the two monitor strip placement options. Touch the picture that demonstrates where you applied the monitor strip.  Your monitor strip and battery are waterproof. You are able to shower, bathe, or swim with the monitor on. They just ask you do not submerge deeper than 3 feet underwater. We recommend removing the monitor if you are swimming in a lake, river, or ocean.  Your monitor  battery will need to be switched to a fully charged monitor battery approximately once a week. The cell phone will alert you of an action which needs to be made.  On the cell phone, tap for details to reveal connection status, monitor battery status, and cell phone battery status. The green dots indicates your monitor is in good status. A red dot indicates there is something that needs your attention.  To record a symptom, click the circle on the monitor battery. In 30-60 seconds a list of symptoms will appear on the cell phone. Select your symptom and tap save. Your monitor will record a sustained or significant arrhythmia regardless of you clicking the button. Some patients do not feel the heart rhythm irregularities. [employer] will notify us of any serious or critical events.  Refer to instruction booklet for instructions on switching batteries, changing strips, the Do not disturb or Pause features, or any additional questions.  Call [employer] at [phone], to confirm your monitor is transmitting and record your baseline. They will answer any questions you may have regarding the monitor instructions at that time.  Returning the monitor to [place] all equipment back into  blue box. Peel off strip of paper to expose adhesive and close box securely. There is a prepaid UPS shipping label on this box. Drop in a UPS drop box, or at a UPS facility like [employer]. You may also contact [employer] to arrange UPS to pick up monitor package at your home.

## 2019-06-30 ENCOUNTER — Telehealth: Payer: Self-pay

## 2019-06-30 NOTE — Telephone Encounter (Signed)
Received faxed PA form from [facility] for nortriptyline 25 mg cap.  Placed form in Dr. Synthia Innocent box.

## 2019-07-01 NOTE — Telephone Encounter (Signed)
Completed PA faxed to [facility] at [phone].

## 2019-07-01 NOTE — Telephone Encounter (Signed)
Filled and in [name]'s box.  

## 2019-07-03 ENCOUNTER — Ambulatory Visit (INDEPENDENT_AMBULATORY_CARE_PROVIDER_SITE_OTHER): Payer: Medicare Other

## 2019-07-03 DIAGNOSIS — I480 Paroxysmal atrial fibrillation: Secondary | ICD-10-CM | POA: Diagnosis not present

## 2019-07-04 ENCOUNTER — Encounter: Payer: Self-pay | Admitting: Family Medicine

## 2019-07-04 NOTE — Telephone Encounter (Signed)
[place] with [facility] request cb about completion of PA for pt PA # [id number].  the PA needs to be completed 07/04/19.

## 2019-07-05 NOTE — Telephone Encounter (Signed)
Denial letter from insurance placed in Dr Gutierrez's in basket  Please advise, thanks.

## 2019-07-05 NOTE — Telephone Encounter (Signed)
Returned call to [employer] about PA.  Told the form we faxed was incomplete and they have sent a fax requesting the additional info needed.   Received faxed PA form.  Placed in Dr. Synthia Innocent box.

## 2019-07-06 MED ORDER — VENLAFAXINE HCL ER 37.5 MG PO CP24: 37.5000 mg | ORAL_CAPSULE | Freq: Every day | ORAL | 1 refills | Status: DC

## 2019-07-06 NOTE — Telephone Encounter (Signed)
See prior message. Will stop nortriptyline and in its place try effexor for neuropathy.

## 2019-07-06 NOTE — Telephone Encounter (Signed)
Denial noted. Must first try and fail effexor or lyrica. See mychart message.

## 2019-07-06 NOTE — Telephone Encounter (Signed)
Will change nortriptyline to effexor.

## 2019-07-19 ENCOUNTER — Ambulatory Visit (INDEPENDENT_AMBULATORY_CARE_PROVIDER_SITE_OTHER): Payer: Medicare Other | Admitting: Family Medicine

## 2019-07-19 ENCOUNTER — Encounter: Payer: Self-pay | Admitting: Family Medicine

## 2019-07-19 ENCOUNTER — Other Ambulatory Visit: Payer: Self-pay

## 2019-07-19 ENCOUNTER — Ambulatory Visit (INDEPENDENT_AMBULATORY_CARE_PROVIDER_SITE_OTHER)
Admission: RE | Admit: 2019-07-19 | Discharge: 2019-07-19 | Disposition: A | Discharge status: Home / Self Care | Payer: Medicare Other | Source: Ambulatory Visit | Attending: Family Medicine | Admitting: Family Medicine

## 2019-07-19 VITALS — BP 124/78 | HR 71 | Temp 97.9°F | Ht 71.0 in | Wt 215.4 lb

## 2019-07-19 DIAGNOSIS — R208 Other disturbances of skin sensation: Secondary | ICD-10-CM | POA: Diagnosis not present

## 2019-07-19 NOTE — Patient Instructions (Addendum)
Labs today Xray today We may set you up for MRI of lumbar spine. And then discuss spine clinic vs neurology referral depending on results.

## 2019-07-19 NOTE — Telephone Encounter (Signed)
Pt has OV today at 2:15.  FYI to Dr. Darnell Level.

## 2019-07-19 NOTE — Progress Notes (Signed)
This visit was conducted in person.  BP 124/78 (BP Location: Left Arm, Patient Position: Sitting, Cuff Size: Normal)   Pulse 71   Temp 97.9 F (36.6 C) (Temporal)   Ht 5\' 11"  (1.803 m)   Wt 215 lb 7 oz (97.7 kg)   SpO2 96%   BMI 30.05 kg/m    CC: foot pain Subjective:    Patient ID: Theodore [name], male    DOB: 1934/05/26, 85 y.o.   MRN: [id number]  HPI: Theodore [name] is a 85 y.o. male presenting on 07/19/2019 for Foot Pain (C/o burning sensation in bilateral feet.  Seen months ago for same sxs.  Not improving. )   See prior note for details.   6 month history of bilateral burning foot pain and redness at soles from heels to toes that develops 2 hours after falling asleep only when supine - very uncomfortable, wakes him up at night and gets relief by sitting in chair - but has to sit up for 1-2 hours to get relief. This happens every night. No paresthesias or numbness. No trouble when standing, sitting, or walking. No claudication symptoms with walking. No back pain or radicular pain down legs. No h/o diabetes, regularly takes b12, occasional beer. Symptoms seemed to worsen when plavix, protonix were started.   No arm symptoms.  Continues gabapentin 600mg  nightly. 900mg  was too high a dose.   Known h/o lumbar back surgery (L4/5 fusion in 2010) as well as cervical fusion 2011. H/o bilateral knee replacement surgeries (R 2006, L 2010).   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   Effexor caused shaking/tremors.  Nortriptyline caused increased arrhythmia.   Ongoing foot pain - rough night last night only slept 3 hours.      Relevant past medical, surgical, family and social history reviewed and updated as indicated. Interim medical history since our last visit reviewed. Allergies and medications reviewed and updated. Outpatient Medications Prior to Visit  Medication Sig Dispense Refill    . acetaminophen (TYLENOL) 500 MG tablet Take 1 tablet (500 mg total) by mouth 3 (three) times daily as needed. 30 tablet 0  . Ascorbic Acid (VITAMIN C PO) Take 500 mg by mouth daily.     [name] atorvastatin (LIPITOR) 40 MG tablet Take 1 tablet (40 mg total) by mouth daily. 90 tablet 3  . budesonide-formoterol (SYMBICORT) 160-4.5 MCG/ACT inhaler Inhale 2 puffs into the lungs 2 (two) times daily. 3 Inhaler 3  . Cholecalciferol (VITAMIN D3) 2000 units TABS Take 2,000 Units by mouth daily.    . clopidogrel (PLAVIX) 75 MG tablet Take 1 tablet (75 mg total) by mouth daily. 90 tablet 3  . ezetimibe (ZETIA) 10 MG tablet Take 1 tablet (10 mg total) by mouth daily. 90 tablet 3  . furosemide (LASIX) 20 MG tablet Take 1 tablet (20 mg total) by mouth daily. 90 tablet 3  . gabapentin (NEURONTIN) 300 MG capsule Take 600 mg by mouth at bedtime.     . metoprolol succinate (TOPROL-XL) 50 MG 24 hr tablet Take 3 tablets (150 mg total) by mouth daily. Take with or immediately following a meal. 270 tablet 2  . nitroGLYCERIN (NITROSTAT) 0.4 MG SL tablet Place 1 tablet (0.4 mg total) under the tongue every 5 (five) minutes as needed. 25 tablet 2  . pantoprazole (PROTONIX) 40 MG tablet Take 1 tablet (40 mg total) by mouth daily. 90 tablet  3  . potassium chloride (KLOR-CON) 10 MEQ tablet TAKE 1 TABLET TWICE A DAY 180 tablet 3  . rivaroxaban (XARELTO) 20 MG TABS tablet Take 1 tablet (20 mg total) by mouth daily with supper. 90 tablet 3  . Spacer/Aero-Holding Chambers (AEROCHAMBER MV) inhaler Use as instructed 1 each 0  . valsartan (DIOVAN) 320 MG tablet Take 1 tablet (320 mg total) by mouth daily. 90 tablet 3  . vitamin B-12 (CYANOCOBALAMIN) 1000 MCG tablet Take 1 tablet (1,000 mcg total) by mouth daily.    [name] venlafaxine XR (EFFEXOR XR) 37.5 MG 24 hr capsule Take 1 capsule (37.5 mg total) by mouth daily with breakfast. (Patient not taking: Reported on 07/19/2019) 30 capsule 1   No facility-administered medications prior to  visit.     Per HPI unless specifically indicated in ROS section below Review of Systems Objective:  BP 124/78 (BP Location: Left Arm, Patient Position: Sitting, Cuff Size: Normal)   Pulse 71   Temp 97.9 F (36.6 C) (Temporal)   Ht 5\' 11"  (1.803 m)   Wt 215 lb 7 oz (97.7 kg)   SpO2 96%   BMI 30.05 kg/m   Wt Readings from Last 3 Encounters:  07/19/19 215 lb 7 oz (97.7 kg)  06/28/19 217 lb (98.4 kg)  06/13/19 216 lb 3.2 oz (98.1 kg)      Physical Exam Vitals and nursing note reviewed.  Constitutional:      Appearance: Normal appearance. He is not ill-appearing.  Musculoskeletal:        General: No swelling or tenderness. Normal range of motion.     Right lower leg: No edema.     Left lower leg: No edema.     Comments:  No pain midline spine No paraspinous mm tenderness Neg SLR bilaterally. No pain with int/ext rotation at hip.  Skin:    General: Skin is warm and dry.     Findings: No erythema or rash.     Comments:  Purplish dependent discoloration of soles and toes of feet 2+ PT, 1+ DP bilaterally Sensation intact to monofilament testing at soles and dorsal feet  Neurological:     General: No focal deficit present.     Mental Status: He is alert.  Psychiatric:        Mood and Affect: Mood normal.        Behavior: Behavior normal.       Results for orders placed or performed in visit on 03/09/19  Vitamin B12  Result Value Ref Range   Vitamin B-12 689 211 - 911 pg/mL  Folate  Result Value Ref Range   Folate 11.5 >5.9 ng/mL  TSH  Result Value Ref Range   TSH 1.76 0.35 - 4.50 uIU/mL  Hemoglobin A1c  Result Value Ref Range   Hgb A1c MFr Bld 5.9 4.6 - 6.5 %  CBC with Differential/Platelet  Result Value Ref Range   WBC 8.1 4.0 - 10.5 K/uL   RBC 4.41 4.22 - 5.81 Mil/uL   Hemoglobin 14.0 13.0 - 17.0 g/dL   HCT 42.0 39.0 - 52.0 %   MCV 95.0 78.0 - 100.0 fl   MCHC 33.3 30.0 - 36.0 g/dL   RDW 14.1 11.5 - 15.5 %   Platelets 168.0 150.0 - 400.0 K/uL    Neutrophils Relative % 62.5 43.0 - 77.0 %   Lymphocytes Relative 27.8 12.0 - 46.0 %   Monocytes Relative 8.5 3.0 - 12.0 %   Eosinophils Relative 1.0 0.0 - 5.0 %  Basophils Relative 0.2 0.0 - 3.0 %   Neutro Abs 5.0 1.4 - 7.7 K/uL   Lymphs Abs 2.2 0.7 - 4.0 K/uL   Monocytes Absolute 0.7 0.1 - 1.0 K/uL   Eosinophils Absolute 0.1 0.0 - 0.7 K/uL   Basophils Absolute 0.0 0.0 - 0.1 K/uL   Lab Results  Component Value Date   CREATININE 1.13 01/18/2019   BUN 17 01/18/2019   NA 133 (L) 01/18/2019   K 4.2 01/18/2019   CL 96 01/18/2019   CO2 31 01/18/2019    Assessment & Plan:  This visit occurred during the SARS-CoV-2 public health emergency.  Safety protocols were in place, including screening questions prior to the visit, additional usage of staff PPE, and extensive cleaning of exam room while observing appropriate contact time as indicated for disinfecting solutions.   Problem List Items Addressed This Visit    Burning sensation of foot - Primary    Ongoing for 6 months since cardiac procedures. 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  Pedal pulses are good pointing against arterial claudication as cause. Labwork eval has been unrevealing for cause of peripheral neuropathy - consider SPEP.  Doesn't endorse typical neurogenic claudication symptoms however he has known lumbar and cervical disc disease s/p respective fusions 2010, 2011. Scarring from lumbar procedure could be contributing. Will start evaluation with lumbar films and likely proceed with contrasted MRI to further evaluate this. If abnormal MRI, would likely proceed with PMR eval. If unrevealing MRI, likely recommend neurology evaluation, consider NCS.       Relevant Orders   DG Lumbar Spine Complete   Basic metabolic panel       No orders of the defined types were placed in this encounter.  Orders Placed This Encounter  Procedures  . DG Lumbar Spine Complete     Standing Status:   Future    Number of Occurrences:   1    Standing Expiration Date:   07/18/2020    Order Specific Question:   Reason for Exam (SYMPTOM  OR DIAGNOSIS REQUIRED)    Answer:   eval for spinal stenosis with bilateral pedal neuritis symptoms    Order Specific Question:   Preferred imaging location?    Answer:   [facility]    Order Specific Question:   Radiology Contrast Protocol - do NOT remove file path    Answer:   [email or web address]  . Basic metabolic panel    Patient Instructions  Labs today Xray today We may set you up for MRI of lumbar spine. And then discuss spine clinic vs neurology referral depending on results.    Follow up plan: Return if symptoms worsen or fail to improve.  [name], MD

## 2019-07-20 ENCOUNTER — Encounter: Payer: Self-pay | Admitting: Family Medicine

## 2019-07-20 LAB — BASIC METABOLIC PANEL
BUN: 20 mg/dL (ref 6–23)
CO2: 28 mEq/L (ref 19–32)
Calcium: 9.1 mg/dL (ref 8.4–10.5)
Chloride: 97 mEq/L (ref 96–112)
Creatinine, Ser: 1.06 mg/dL (ref 0.40–1.50)
GFR: 66.75 mL/min (ref >60.00)
Glucose, Bld: 177 mg/dL — ABNORMAL HIGH (ref 70–99)
Potassium: 4.5 mEq/L (ref 3.5–5.1)
Sodium: 131 mEq/L — ABNORMAL LOW (ref 135–145)

## 2019-07-20 NOTE — Assessment & Plan Note (Addendum)
Ongoing for 6 months since cardiac procedures. 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  Pedal pulses are good pointing against arterial claudication as cause. Labwork eval has been unrevealing for cause of peripheral neuropathy - consider SPEP.  Doesn't endorse typical neurogenic claudication symptoms however he has known lumbar and cervical disc disease s/p respective fusions 2010, 2011. Scarring from lumbar procedure could be contributing. Will start evaluation with lumbar films and likely proceed with contrasted MRI to further evaluate this. If abnormal MRI, would likely proceed with PMR eval. If unrevealing MRI, likely recommend neurology evaluation, consider NCS.

## 2019-07-23 ENCOUNTER — Other Ambulatory Visit: Payer: Self-pay | Admitting: Family Medicine

## 2019-07-23 DIAGNOSIS — G609 Hereditary and idiopathic neuropathy, unspecified: Secondary | ICD-10-CM

## 2019-07-23 DIAGNOSIS — M5136 Other intervertebral disc degeneration, lumbar region: Secondary | ICD-10-CM

## 2019-07-23 DIAGNOSIS — R208 Other disturbances of skin sensation: Secondary | ICD-10-CM

## 2019-07-23 DIAGNOSIS — M4807 Spinal stenosis, lumbosacral region: Secondary | ICD-10-CM

## 2019-08-08 ENCOUNTER — Other Ambulatory Visit: Payer: Self-pay | Admitting: Family Medicine

## 2019-08-08 DIAGNOSIS — R739 Hyperglycemia, unspecified: Secondary | ICD-10-CM

## 2019-08-08 DIAGNOSIS — G609 Hereditary and idiopathic neuropathy, unspecified: Secondary | ICD-10-CM

## 2019-08-09 ENCOUNTER — Other Ambulatory Visit (INDEPENDENT_AMBULATORY_CARE_PROVIDER_SITE_OTHER): Payer: Medicare Other

## 2019-08-09 ENCOUNTER — Ambulatory Visit (INDEPENDENT_AMBULATORY_CARE_PROVIDER_SITE_OTHER): Payer: Medicare Other

## 2019-08-09 ENCOUNTER — Other Ambulatory Visit: Payer: Self-pay

## 2019-08-09 VITALS — BP 148/74 | HR 65 | Wt 216.0 lb

## 2019-08-09 DIAGNOSIS — R739 Hyperglycemia, unspecified: Secondary | ICD-10-CM | POA: Diagnosis not present

## 2019-08-09 DIAGNOSIS — G609 Hereditary and idiopathic neuropathy, unspecified: Secondary | ICD-10-CM

## 2019-08-09 DIAGNOSIS — Z0000 Encounter for general adult medical examination without abnormal findings: Secondary | ICD-10-CM | POA: Diagnosis not present

## 2019-08-09 LAB — HEMOGLOBIN A1C: Hgb A1c MFr Bld: 5.9 % (ref 4.6–6.5)

## 2019-08-09 NOTE — Progress Notes (Signed)
Subjective:   Theodore [name] is a 85 y.o. male who presents for Medicare Annual/Subsequent preventive examination.  Review of Systems: N/A      I connected with the patient today by telephone and verified that I am speaking with the correct person using two identifiers. Location patient: home Location nurse: work Persons participating in the virtual visit: patient, Marine scientist.   I discussed the limitations, risks, security and privacy concerns of performing an evaluation and management service by telephone and the availability of in person appointments. I also discussed with the patient that there may be a patient responsible charge related to this service. The patient expressed understanding and verbally consented to this telephonic visit.    Interactive audio and video telecommunications were attempted between this nurse and patient, however failed, due to patient having technical difficulties OR patient did not have access to video capability.  We continued and completed visit with audio only.     Cardiac Risk Factors include: advanced age (>25men, >16 women);hypertension;male gender     Objective:    Today's Vitals   08/09/19 0901  BP: (!) 148/74  Pulse: 65  SpO2: 95%  Weight: 216 lb (98 kg)   Body mass index is 30.13 kg/m.  Advanced Directives 08/09/2019 01/10/2019 12/02/2018 08/06/2018 07/23/2017 03/03/2017 03/03/2017  Does Patient Have a Medical Advance Directive? Yes Yes No Yes Yes Yes Yes  Type of Paramedic of [place];Living will Healthcare Power of [place];Living will Living will;Healthcare Power of [place];Living will [place]  Does patient want to make changes to medical advance directive? - No - Guardian declined - No - Patient declined - No - Patient declined -  Copy of [place] in Chart? Yes - validated most recent copy scanned in chart (See row  information) No - copy requested - Yes - validated most recent copy scanned in chart (See row information) Yes No - copy requested -  Would patient like information on creating a medical advance directive? - - No - Patient declined - - - -    Current Medications (verified) Outpatient Encounter Medications as of 08/09/2019  Medication Sig  . acetaminophen (TYLENOL) 500 MG tablet Take 1 tablet (500 mg total) by mouth 3 (three) times daily as needed.  . Ascorbic Acid (VITAMIN C PO) Take 500 mg by mouth daily.   [name] atorvastatin (LIPITOR) 40 MG tablet Take 1 tablet (40 mg total) by mouth daily.  . budesonide-formoterol (SYMBICORT) 160-4.5 MCG/ACT inhaler Inhale 2 puffs into the lungs 2 (two) times daily.  . Cholecalciferol (VITAMIN D3) 2000 units TABS Take 2,000 Units by mouth daily.  . clopidogrel (PLAVIX) 75 MG tablet Take 1 tablet (75 mg total) by mouth daily.  [name] ezetimibe (ZETIA) 10 MG tablet Take 1 tablet (10 mg total) by mouth daily.  . furosemide (LASIX) 20 MG tablet Take 1 tablet (20 mg total) by mouth daily.  [name] gabapentin (NEURONTIN) 300 MG capsule Take 600 mg by mouth at bedtime.   . metoprolol succinate (TOPROL-XL) 50 MG 24 hr tablet Take 3 tablets (150 mg total) by mouth daily. Take with or immediately following a meal.  . nitroGLYCERIN (NITROSTAT) 0.4 MG SL tablet Place 1 tablet (0.4 mg total) under the tongue every 5 (five) minutes as needed.  . pantoprazole (PROTONIX) 40 MG tablet Take 1 tablet (40 mg total) by mouth daily.  . potassium chloride (KLOR-CON) 10 MEQ tablet TAKE 1  TABLET TWICE A DAY  . rivaroxaban (XARELTO) 20 MG TABS tablet Take 1 tablet (20 mg total) by mouth daily with supper.  [name] Spacer/Aero-Holding Chambers (AEROCHAMBER MV) inhaler Use as instructed  . valsartan (DIOVAN) 320 MG tablet Take 1 tablet (320 mg total) by mouth daily.  . vitamin B-12 (CYANOCOBALAMIN) 1000 MCG tablet Take 1 tablet (1,000 mcg total) by mouth daily.   No facility-administered encounter  medications on file as of 08/09/2019.    Allergies (verified) Penicillins, Amlodipine, Effexor [venlafaxine], Lisinopril, Mevacor [lovastatin], Plavix [clopidogrel], Vicodin [hydrocodone-acetaminophen], Zocor [simvastatin], and Oseltamivir phosphate   History: Past Medical History:  Diagnosis Date  . CAP (community acquired pneumonia) 01/19/2018  . History of arthritis   . History of rheumatic fever   . Hypercholesterolemia   . Hypertension   . Nocturia   . PAF (paroxysmal atrial fibrillation) [facility]) January 2013   Placed on Pradaxa. Did not require cardioversion; spontaneously converted  . Peripheral edema   . Right bundle branch block   . SOB (shortness of breath)   . Tendonitis of elbow, left    Past Surgical History:  Procedure Laterality Date  . ATRIAL FIBRILLATION ABLATION  12/19/2015  . BACK SURGERY  12/24/09   fusion C3-C4  . BACK SURGERY  2010   fusion L4-L5  . CARDIAC CATHETERIZATION  2009   NONOBSTRUCTIVE ATHERSCLEROTIC CORONARY DISEASE AND NORMAL  LV FUNCTION  . CARDIAC CATHETERIZATION N/A 09/08/2014   Procedure: Left Heart Cath and Coronary Angiography;  Surgeon: Belva Crome, MD;  Location: [place] [facility];  Service: Cardiovascular;  Laterality: N/A;  . CARDIAC CATHETERIZATION N/A 09/28/2015   Procedure: Left Heart Cath and Coronary Angiography;  Surgeon: Leonie Man, MD;  Location: [place] [facility];  Service: Cardiovascular;  Laterality: N/A;  . CARDIAC CATHETERIZATION N/A 09/28/2015   Procedure: Intravascular Pressure Wire/FFR Study;  Surgeon: Leonie Man, MD;  Location: [name] [facility];  Service: Cardiovascular;  Laterality: N/A;  . CARDIOVERSION N/A 10/23/2015   Procedure: CARDIOVERSION;  Surgeon: [name], MD;  Location: [facility] ENDOSCOPY;  Service: Cardiovascular;  Laterality: N/A;  . COLONOSCOPY  2013   per patient, rpt 5 yrs  . COLONOSCOPY WITH PROPOFOL N/A 12/02/2018   TAx2, HP, diverticulosis [name], MD)  . CORONARY ATHERECTOMY  N/A 01/11/2019   Procedure: CORONARY ATHERECTOMY;  Surgeon: Burnell [name], MD;  Location: [place] [facility];  Service: Cardiovascular;  Laterality: N/A;  . CORONARY STENT INTERVENTION N/A 01/11/2019   Procedure: CORONARY STENT INTERVENTION;  Surgeon: Burnell [name], MD;  Location: [place] [facility];  Service: Cardiovascular;  Laterality: N/A;  . ELECTROPHYSIOLOGIC STUDY N/A 12/19/2015   Procedure: Atrial Fibrillation Ablation;  Surgeon: [name], MD;  Location: [place] [facility] LAB;  Service: Cardiovascular;  Laterality: N/A;  . ESOPHAGOGASTRODUODENOSCOPY (EGD) WITH PROPOFOL N/A 12/02/2018   medium HH [name], MD)  . INTRAVASCULAR PRESSURE WIRE/FFR STUDY N/A 01/10/2019   Procedure: INTRAVASCULAR PRESSURE WIRE/FFR STUDY;  Surgeon: Leonie Man, MD;  Location: [place] [facility];  Service: Cardiovascular;  Laterality: N/A;  . KNEE ARTHROSCOPY Left 03/2016   Dr. [name]  . LEFT HEART CATH AND CORONARY ANGIOGRAPHY N/A 01/10/2019   Procedure: LEFT HEART CATH AND CORONARY ANGIOGRAPHY;  Surgeon: Leonie Man, MD;  Location: [place] [facility];  Service: Cardiovascular;  Laterality: N/A;  . PATELLAR TENDON REPAIR Left 2008  . POLYPECTOMY  12/02/2018   Procedure: POLYPECTOMY;  Surgeon: [name], MD;  Location: [facility] ENDOSCOPY;  Service: Endoscopy;;  . REPLACEMENT TOTAL KNEE Right 2006  . TOTAL KNEE ARTHROPLASTY Left 02/09/2017   Procedure: LEFT TOTAL KNEE ARTHROPLASTY, EXCISION LEFT DISTAL THIGH MASS;  Surgeon: [name], MD;  Location: [facility] ORS;  Service: Orthopedics;  Laterality: Left;  90 mins  . TRICEPS TENDON REPAIR Left 2013   Family History  Problem Relation Age of Onset  . Heart attack Father   . COPD Father   . Hypertension Father   . Heart disease Brother   . COPD Brother   . Asthma Sister   . Stroke Neg Hx    Social History   Socioeconomic History  . Marital status: Married    Spouse name: Not on file  . Number of children: Not on  file  . Years of education: Not on file  . Highest education level: Not on file  Occupational History  . Occupation: retired    Fish farm manager: RETIRED    Comment: Art gallery manager  Tobacco Use  . Smoking status: Never Smoker  . Smokeless tobacco: Never Used  Vaping Use  . Vaping Use: Never used  Substance and Sexual Activity  . Alcohol use: Yes    Alcohol/week: 14.0 standard drinks    Types: 14 Cans of beer per week    Comment: 2 beers daily  . Drug use: No  . Sexual activity: Not Currently  Other Topics Concern  . Not on file  Social History Narrative   Lives with wife, no pets   Retired   Occ: Art gallery manager   Edu: master's   Activity: golf   Diet: good water, fruits/vegetables daily   Social Determinants of Health   Financial Resource Strain: Low Risk   . Difficulty of Paying Living Expenses: Not hard at all  Food Insecurity: No Food Insecurity  . Worried About Charity fundraiser in the Last Year: Never true  . Ran Out of Food in the Last Year: Never true  Transportation Needs: No Transportation Needs  . Lack of Transportation (Medical): No  . Lack of Transportation (Non-Medical): No  Physical Activity: Inactive  . Days of Exercise per Week: 0 days  . Minutes of Exercise per Session: 0 min  Stress: No Stress Concern Present  . Feeling of Stress : Not at all  Social Connections:   . Frequency of Communication with Friends and Family:   . Frequency of Social Gatherings with Friends and Family:   . Attends Religious Services:   . Active Member of Clubs or Organizations:   . Attends Archivist Meetings:   [name] Marital Status:     Tobacco Counseling Counseling given: Not Answered   Clinical Intake:  Pre-visit preparation completed: Yes  Pain : No/denies pain     Nutritional Status: BMI > 30  Obese Nutritional Risks: None Diabetes: No  How often do you need to have someone help you when you read instructions, pamphlets, or other written  materials from your doctor or pharmacy?: 1 - Never What is the last grade level you completed in school?: Masters  Diabetic: No Nutrition Risk Assessment:  Has the patient had any N/V/D within the last 2 months?  No  Does the patient have any non-healing wounds?  No  Has the patient had any unintentional weight loss or weight gain?  No   Diabetes:  Is the patient diabetic?  No  If diabetic, was a CBG obtained today?  No  Did the patient bring in their glucometer from home?  No  How often do you monitor your CBG's? N/A.   Financial Strains and Diabetes Management:  Are you having any financial strains with the device, your supplies or your medication? No .  Does the patient want to be seen by Chronic Care Management for management of their diabetes?  No  Would the patient like to be referred to a Nutritionist or for Diabetic Management?  No    Interpreter Needed?: No  Information entered by :: CJohnson, LPN   Activities of Daily Living In your present state of health, do you have any difficulty performing the following activities: 08/09/2019  Hearing? N  Vision? N  Difficulty concentrating or making decisions? N  Walking or climbing stairs? N  Dressing or bathing? N  Doing errands, shopping? N  Preparing Food and eating ? N  Using the Toilet? N  In the past six months, have you accidently leaked urine? N  Do you have problems with loss of bowel control? N  Managing your Medications? N  Managing your Finances? N  Housekeeping or managing your Housekeeping? N  Some recent data might be hidden    Patient Care Team: [name], MD as PCP - General (Family Medicine) Constance Haw, MD as PCP - Electrophysiology (Cardiology) Skeet Latch, MD as PCP - Cardiology (Cardiology) Skeet Latch, MD as Attending Physician (Cardiology) Druscilla Brownie, MD as Consulting Physician (Dermatology) Deneise Lever, MD as Consulting Physician (Pulmonary  Disease) Dingeldein, Remo Lipps, MD as Referring Physician (Ophthalmology)  Indicate any recent Medical Services you may have received from other than [facility] providers in the past year (date may be approximate).     Assessment:   This is a routine wellness examination for [place].  Hearing/Vision screen  Hearing Screening   125Hz  250Hz  500Hz  1000Hz  2000Hz  3000Hz  4000Hz  6000Hz  8000Hz   Right ear:           Left ear:           Vision Screening Comments: Patient gets annual eye exams   Dietary issues and exercise activities discussed: Current Exercise Habits: The patient does not participate in regular exercise at present, Exercise limited by: None identified  Goals    . Patient Stated     Starting 08/06/18, I will continue to take medications as prescribed.     . Patient Stated     08/09/2019, I will maintain and continue medications as prescribed.       Depression Screen PHQ 2/9 Scores 08/09/2019 02/14/2019 08/06/2018 07/23/2017 01/13/2017 07/17/2016 02/06/2016  PHQ - 2 Score 0 0 0 0 0 0 0  PHQ- 9 Score 0 - 0 0 - - -    Fall Risk Fall Risk  08/09/2019 02/14/2019 08/06/2018 07/23/2017 01/13/2017  Falls in the past year? 0 0 0 No No  Number falls in past yr: 0 - - - -  Injury with Fall? 0 - - - -  Risk for fall due to : Medication side effect - - - -  Follow up Falls evaluation completed;Falls prevention discussed - - - -    Any stairs in or around the home? Yes  If so, are there any without handrails? No  Home free of loose throw rugs in walkways, pet beds, electrical cords, etc? Yes  Adequate lighting in your home to reduce risk of falls? Yes   ASSISTIVE DEVICES UTILIZED TO PREVENT FALLS:  Life alert? No  Use of a cane, walker or w/c? No  Grab bars in the bathroom? No  Shower chair or  bench in shower? No  Elevated toilet seat or a handicapped toilet? No   TIMED UP AND GO:  Was the test performed? N/A, telephonic visit .    Cognitive Function: MMSE - Mini Mental State Exam  08/09/2019 08/06/2018 07/23/2017 07/17/2016 08/07/2015  Orientation to time 5 5 5 5 5   Orientation to Place 5 5 5 5 5   Registration 3 3 3 3 3   Attention/ Calculation 5 0 0 0 0  Recall 3 3 3 3 3   Language- name 2 objects - 0 0 0 0  Language- repeat 1 1 1 1 1   Language- follow 3 step command - 0 3 3 3   Language- read & follow direction - 0 0 0 0  Write a sentence - 0 0 0 0  Copy design - 0 0 0 0  Total score - 17 20 20 20   Mini Cog  Mini-Cog screen was completed. Maximum score is 22. A value of 0 denotes this part of the MMSE was not completed or the patient failed this part of the Mini-Cog screening.       Immunizations Immunization History  Administered Date(s) Administered  . Influenza Split 11/11/2010, 11/11/2011, 11/23/2012  . Influenza Whole 11/10/2009  . Influenza, High Dose Seasonal PF 11/17/2016, 10/22/2017, 10/26/2018  . Influenza,inj,Quad PF,6+ Mos 10/25/2015  . Influenza,inj,quad, With Preservative 10/13/2016  . Influenza-Unspecified 11/10/2013, 10/23/2014, 10/25/2018  . PFIZER SARS-COV-2 Vaccination 02/27/2019, 03/18/2019  . Pneumococcal Conjugate-13 10/23/2014  . Pneumococcal Polysaccharide-23 11/10/2009  . Zoster 02/11/2012    TDAP status: Due, Education has been provided regarding the importance of this vaccine. Advised may receive this vaccine at local pharmacy or Health Dept. Aware to provide a copy of the vaccination record if obtained from local pharmacy or Health Dept. Verbalized acceptance and understanding. Flu Vaccine status: Up to date Pneumococcal vaccine status: Up to date Covid-19 vaccine status: Completed vaccines  Qualifies for Shingles Vaccine? Yes   Zostavax completed Yes   Shingrix Completed?: No.    Education has been provided regarding the importance of this vaccine. Patient has been advised to call insurance company to determine out of pocket expense if they have not yet received this vaccine. Advised may also receive vaccine at local pharmacy or  Health Dept. Verbalized acceptance and understanding.  Screening Tests Health Maintenance  Topic Date Due  . DTAP VACCINES (1) Never done  . DTaP/Tdap/Td (1 - Tdap) 02/10/2020 (Originally 09/12/1953)  . TETANUS/TDAP  02/10/2020 (Originally 09/12/1953)  . INFLUENZA VACCINE  09/11/2019  . COVID-19 Vaccine  Completed  . PNA vac Low Risk Adult  Completed    Health Maintenance  Health Maintenance Due  Topic Date Due  . DTAP VACCINES (1) Never done    Colorectal cancer screening: No longer required.   Lung Cancer Screening: (Low Dose CT Chest recommended if Age 85-80 years, 30 pack-year currently smoking OR have quit w/in 15years.) does not qualify.    Additional Screening:  Hepatitis C Screening: does not qualify; Completed N/A  Vision Screening: Recommended annual ophthalmology exams for early detection of glaucoma and other disorders of the eye. Is the patient up to date with their annual eye exam?  Yes  Who is the provider or what is the name of the office in which the patient attends annual eye exams? [facility] If pt is not established with a provider, would they like to be referred to a provider to establish care? No .   Dental Screening: Recommended annual dental exams for proper  oral hygiene  Community Resource Referral / Chronic Care Management: CRR required this visit?  No   CCM required this visit?  No      Plan:     I have personally reviewed and noted the following in the patient's chart:   . Medical and social history . Use of alcohol, tobacco or illicit drugs  . Current medications and supplements . Functional ability and status . Nutritional status . Physical activity . Advanced directives . List of other physicians . Hospitalizations, surgeries, and ER visits in previous 12 months . Vitals . Screenings to include cognitive, depression, and falls . Referrals and appointments  In addition, I have reviewed and discussed with patient certain  preventive protocols, quality metrics, and best practice recommendations. A written personalized care plan for preventive services as well as general preventive health recommendations were provided to patient.   Due to this being a telephonic visit, the after visit summary with patients personalized plan was offered to patient via mail or my-chart. Patient preferred to pick up at office at next visit.   Andrez Grime, LPN   [id number]/83/[id number]

## 2019-08-09 NOTE — Patient Instructions (Signed)
[name] , Thank you for taking time to come for your Medicare Wellness Visit. I appreciate your ongoing commitment to your health goals. Please review the following plan we discussed and let me know if I can assist you in the future.   Screening recommendations/referrals: Colonoscopy: no longer required Recommended yearly ophthalmology/optometry visit for glaucoma screening and checkup Recommended yearly dental visit for hygiene and checkup  Vaccinations: Influenza vaccine: Up to date, completed 10/26/2018, due 09/2019 Pneumococcal vaccine: Completed series Tdap vaccine: decline, insurance Shingles vaccine: due, check with insurance for coverage    Advanced directives: copy in chart  Conditions/risks identified: hypertension  Next appointment: 08/16/2019 @ 9 am   Preventive Care 83 Years and Older, Male Preventive care refers to lifestyle choices and visits with your health care provider that can promote health and wellness. What does preventive care include?  A yearly physical exam. This is also called an annual well check.  Dental exams once or twice a year.  Routine eye exams. Ask your health care provider how often you should have your eyes checked.  Personal lifestyle choices, including:  Daily care of your teeth and gums.  Regular physical activity.  Eating a healthy diet.  Avoiding tobacco and drug use.  Limiting alcohol use.  Practicing safe sex.  Taking low doses of aspirin every day.  Taking vitamin and mineral supplements as recommended by your health care provider. What happens during an annual well check? The services and screenings done by your health care provider during your annual well check will depend on your age, overall health, lifestyle risk factors, and family history of disease. Counseling  Your health care provider may ask you questions about your:  Alcohol use.  Tobacco use.  Drug use.  Emotional well-being.  Home and relationship  well-being.  Sexual activity.  Eating habits.  History of falls.  Memory and ability to understand (cognition).  Work and work Statistician. Screening  You may have the following tests or measurements:  Height, weight, and BMI.  Blood pressure.  Lipid and cholesterol levels. These may be checked every 5 years, or more frequently if you are over 53 years old.  Skin check.  Lung cancer screening. You may have this screening every year starting at age 70 if you have a 30-pack-year history of smoking and currently smoke or have quit within the past 15 years.  Fecal occult blood test (FOBT) of the stool. You may have this test every year starting at age 37.  Flexible sigmoidoscopy or colonoscopy. You may have a sigmoidoscopy every 5 years or a colonoscopy every 10 years starting at age 27.  Prostate cancer screening. Recommendations will vary depending on your family history and other risks.  Hepatitis C blood test.  Hepatitis B blood test.  Sexually transmitted disease (STD) testing.  Diabetes screening. This is done by checking your blood sugar (glucose) after you have not eaten for a while (fasting). You may have this done every 1-3 years.  Abdominal aortic aneurysm (AAA) screening. You may need this if you are a current or former smoker.  Osteoporosis. You may be screened starting at age 75 if you are at high risk. Talk with your health care provider about your test results, treatment options, and if necessary, the need for more tests. Vaccines  Your health care provider may recommend certain vaccines, such as:  Influenza vaccine. This is recommended every year.  Tetanus, diphtheria, and acellular pertussis (Tdap, Td) vaccine. You may need a Td booster  every 10 years.  Zoster vaccine. You may need this after age 18.  Pneumococcal 13-valent conjugate (PCV13) vaccine. One dose is recommended after age 67.  Pneumococcal polysaccharide (PPSV23) vaccine. One dose is  recommended after age 15. Talk to your health care provider about which screenings and vaccines you need and how often you need them. This information is not intended to replace advice given to you by your health care provider. Make sure you discuss any questions you have with your health care provider. Document Released: 02/23/2015 Document Revised: 10/17/2015 Document Reviewed: 11/28/2014 Elsevier Interactive Patient Education  2017 [place] Prevention in the Home Falls can cause injuries. They can happen to people of all ages. There are many things you can do to make your home safe and to help prevent falls. What can I do on the outside of my home?  Regularly fix the edges of walkways and driveways and fix any cracks.  Remove anything that might make you trip as you walk through a door, such as a raised step or threshold.  Trim any bushes or trees on the path to your home.  Use bright outdoor lighting.  Clear any walking paths of anything that might make someone trip, such as rocks or tools.  Regularly check to see if handrails are loose or broken. Make sure that both sides of any steps have handrails.  Any raised decks and porches should have guardrails on the edges.  Have any leaves, snow, or ice cleared regularly.  Use sand or salt on walking paths during winter.  Clean up any spills in your garage right away. This includes oil or grease spills. What can I do in the bathroom?  Use night lights.  Install grab bars by the toilet and in the tub and shower. Do not use towel bars as grab bars.  Use non-skid mats or decals in the tub or shower.  If you need to sit down in the shower, use a plastic, non-slip stool.  Keep the floor dry. Clean up any water that spills on the floor as soon as it happens.  Remove soap buildup in the tub or shower regularly.  Attach bath mats securely with double-sided non-slip rug tape.  Do not have throw rugs and other things on  the floor that can make you trip. What can I do in the bedroom?  Use night lights.  Make sure that you have a light by your bed that is easy to reach.  Do not use any sheets or blankets that are too big for your bed. They should not hang down onto the floor.  Have a firm chair that has side arms. You can use this for support while you get dressed.  Do not have throw rugs and other things on the floor that can make you trip. What can I do in the kitchen?  Clean up any spills right away.  Avoid walking on wet floors.  Keep items that you use a lot in easy-to-reach places.  If you need to reach something above you, use a strong step stool that has a grab bar.  Keep electrical cords out of the way.  Do not use floor polish or wax that makes floors slippery. If you must use wax, use non-skid floor wax.  Do not have throw rugs and other things on the floor that can make you trip. What can I do with my stairs?  Do not leave any items on the stairs.  Make  sure that there are handrails on both sides of the stairs and use them. Fix handrails that are broken or loose. Make sure that handrails are as long as the stairways.  Check any carpeting to make sure that it is firmly attached to the stairs. Fix any carpet that is loose or worn.  Avoid having throw rugs at the top or bottom of the stairs. If you do have throw rugs, attach them to the floor with carpet tape.  Make sure that you have a light switch at the top of the stairs and the bottom of the stairs. If you do not have them, ask someone to add them for you. What else can I do to help prevent falls?  Wear shoes that:  Do not have high heels.  Have rubber bottoms.  Are comfortable and fit you well.  Are closed at the toe. Do not wear sandals.  If you use a stepladder:  Make sure that it is fully opened. Do not climb a closed stepladder.  Make sure that both sides of the stepladder are locked into place.  Ask someone to  hold it for you, if possible.  Clearly mark and make sure that you can see:  Any grab bars or handrails.  First and last steps.  Where the edge of each step is.  Use tools that help you move around (mobility aids) if they are needed. These include:  Canes.  Walkers.  Scooters.  Crutches.  Turn on the lights when you go into a dark area. Replace any light bulbs as soon as they burn out.  Set up your furniture so you have a clear path. Avoid moving your furniture around.  If any of your floors are uneven, fix them.  If there are any pets around you, be aware of where they are.  Review your medicines with your doctor. Some medicines can make you feel dizzy. This can increase your chance of falling. Ask your doctor what other things that you can do to help prevent falls. This information is not intended to replace advice given to you by your health care provider. Make sure you discuss any questions you have with your health care provider. Document Released: 11/23/2008 Document Revised: 07/05/2015 Document Reviewed: 03/03/2014 Elsevier Interactive Patient Education  2017 [employer].

## 2019-08-09 NOTE — Progress Notes (Signed)
PCP notes:  Health Maintenance: No gaps noted   Abnormal Screenings: none   Patient concerns: none   Nurse concerns: none   Next PCP appt.: 08/16/2019 @ 9 am

## 2019-08-11 LAB — PROTEIN ELECTROPHORESIS, SERUM, WITH REFLEX
Albumin ELP: 4 g/dL (ref 3.8–4.8)
Alpha 1: 0.3 g/dL (ref 0.2–0.3)
Alpha 2: 0.6 g/dL (ref 0.5–0.9)
Beta 2: 0.3 g/dL (ref 0.2–0.5)
Beta Globulin: 0.5 g/dL (ref 0.4–0.6)
Gamma Globulin: 0.8 g/dL (ref 0.8–1.7)
Total Protein: 6.5 g/dL (ref 6.1–8.1)

## 2019-08-11 LAB — IFE INTERPRETATION: Immunofix Electr Int: NOT DETECTED

## 2019-08-16 ENCOUNTER — Other Ambulatory Visit: Payer: Self-pay

## 2019-08-16 ENCOUNTER — Ambulatory Visit: Payer: Medicare Other | Admitting: Family Medicine

## 2019-08-16 ENCOUNTER — Encounter: Payer: Self-pay | Admitting: Family Medicine

## 2019-08-16 VITALS — BP 120/70 | HR 72 | Temp 97.7°F | Ht 71.0 in | Wt 214.2 lb

## 2019-08-16 DIAGNOSIS — M4807 Spinal stenosis, lumbosacral region: Secondary | ICD-10-CM

## 2019-08-16 DIAGNOSIS — R208 Other disturbances of skin sensation: Secondary | ICD-10-CM

## 2019-08-16 DIAGNOSIS — M5417 Radiculopathy, lumbosacral region: Secondary | ICD-10-CM

## 2019-08-16 DIAGNOSIS — R0989 Other specified symptoms and signs involving the circulatory and respiratory systems: Secondary | ICD-10-CM

## 2019-08-16 DIAGNOSIS — G609 Hereditary and idiopathic neuropathy, unspecified: Secondary | ICD-10-CM

## 2019-08-16 NOTE — Assessment & Plan Note (Addendum)
Ongoing positional, does better when sleeps in recliner. Likely due to lumbar stenosis. Pending lumbar MRI and will refer to PM&R for further eval.  Check contrasted given h/o lumbar surgeries to evaluate for scarring.

## 2019-08-16 NOTE — Assessment & Plan Note (Signed)
Ongoing positional, does better when sleeps in recliner. Likely due to lumbar stenosis. Pending lumbar MRI and will refer to PM&R for further eval.

## 2019-08-16 NOTE — Patient Instructions (Addendum)
Touch base with Rosaria Ferries about sooner MRI.  I will go ahead and place referral for PM&R evaluation for peripheral neuropathy likely coming from lumbar stenosis.  I will also order arterial pressures checked at your heart doctor's office.  Return in 6 months for physical.

## 2019-08-16 NOTE — Progress Notes (Addendum)
This visit was conducted in person.  BP 120/70 (BP Location: Left Arm, Patient Position: Sitting, Cuff Size: Normal)   Pulse 72   Temp 97.7 F (36.5 C) (Temporal)   Ht 5\' 11"  (1.803 m)   Wt 214 lb 4 oz (97.2 kg)   SpO2 96%   BMI 29.88 kg/m    CC: 6 mo f/u visit  Subjective:    Patient ID: Theodore [name], male    DOB: August 29, 1934, 85 y.o.   MRN: [id number]  HPI: Theodore [name] is a 85 y.o. male presenting on 08/16/2019 for Follow-up (Here for 6 mo f/u. )   Tough 2 years with knee replacement followed by pulm embolism then pneumonia. Heart catheterization 01/2019 showing significant blockages. S/p coronary atherectomy and coronary stents (DES x2 to LAD) placed 19/04/7900 complicated by afib recurrence.   Peripheral neuropathy possible neurogenic positional pedal neuritis managed with gabapentin 600mg  at night time. Known lumbar and cervical disc disease s/p respective fusions 2010, 2011. Upcoming contrasted lumbar MR scheduled 08/27/2019. Ongoing burning foot pain - when he sleeps in recliner, feet don't bother him.      Relevant past medical, surgical, family and social history reviewed and updated as indicated. Interim medical history since our last visit reviewed. Allergies and medications reviewed and updated. Outpatient Medications Prior to Visit  Medication Sig Dispense Refill  . acetaminophen (TYLENOL) 500 MG tablet Take 1 tablet (500 mg total) by mouth 3 (three) times daily as needed. 30 tablet 0  . Ascorbic Acid (VITAMIN C PO) Take 500 mg by mouth daily.     [name] atorvastatin (LIPITOR) 40 MG tablet Take 1 tablet (40 mg total) by mouth daily. 90 tablet 3  . budesonide-formoterol (SYMBICORT) 160-4.5 MCG/ACT inhaler Inhale 2 puffs into the lungs 2 (two) times daily. 3 Inhaler 3  . Cholecalciferol (VITAMIN D3) 2000 units TABS Take 2,000 Units by mouth daily.    . clopidogrel (PLAVIX) 75 MG tablet Take 1 tablet (75 mg total) by mouth daily. 90 tablet 3  . ezetimibe (ZETIA) 10 MG  tablet Take 1 tablet (10 mg total) by mouth daily. 90 tablet 3  . furosemide (LASIX) 20 MG tablet Take 1 tablet (20 mg total) by mouth daily. 90 tablet 3  . gabapentin (NEURONTIN) 300 MG capsule Take 600 mg by mouth at bedtime.     . metoprolol succinate (TOPROL-XL) 50 MG 24 hr tablet Take 3 tablets (150 mg total) by mouth daily. Take with or immediately following a meal. 270 tablet 2  . nitroGLYCERIN (NITROSTAT) 0.4 MG SL tablet Place 1 tablet (0.4 mg total) under the tongue every 5 (five) minutes as needed. 25 tablet 2  . pantoprazole (PROTONIX) 40 MG tablet Take 1 tablet (40 mg total) by mouth daily. 90 tablet 3  . potassium chloride (KLOR-CON) 10 MEQ tablet TAKE 1 TABLET TWICE A DAY 180 tablet 3  . rivaroxaban (XARELTO) 20 MG TABS tablet Take 1 tablet (20 mg total) by mouth daily with supper. 90 tablet 3  . Spacer/Aero-Holding Chambers (AEROCHAMBER MV) inhaler Use as instructed 1 each 0  . valsartan (DIOVAN) 320 MG tablet Take 1 tablet (320 mg total) by mouth daily. 90 tablet 3  . vitamin B-12 (CYANOCOBALAMIN) 1000 MCG tablet Take 1 tablet (1,000 mcg total) by mouth daily.     No facility-administered medications prior to visit.     Per HPI unless specifically indicated in ROS section below Review of Systems Objective:  BP 120/70 (BP Location: Left Arm, Patient  Position: Sitting, Cuff Size: Normal)   Pulse 72   Temp 97.7 F (36.5 C) (Temporal)   Ht 5\' 11"  (1.803 m)   Wt 214 lb 4 oz (97.2 kg)   SpO2 96%   BMI 29.88 kg/m   Wt Readings from Last 3 Encounters:  08/16/19 214 lb 4 oz (97.2 kg)  08/09/19 216 lb (98 kg)  07/19/19 215 lb 7 oz (97.7 kg)      Physical Exam Vitals and nursing note reviewed.  Constitutional:      General: He is not in acute distress.    Appearance: Normal appearance. He is well-developed. He is not ill-appearing.  Eyes:     General: No scleral icterus.    Extraocular Movements: Extraocular movements intact.     Conjunctiva/sclera: Conjunctivae normal.      Pupils: Pupils are equal, round, and reactive to light.  Cardiovascular:     Rate and Rhythm: Normal rate and regular rhythm.     Pulses: Normal pulses.     Heart sounds: Normal heart sounds. No murmur heard.   Pulmonary:     Effort: Pulmonary effort is normal. No respiratory distress.     Breath sounds: Normal breath sounds. No wheezing, rhonchi or rales.  Musculoskeletal:     Right lower leg: Edema (tr) present.     Left lower leg: Edema (tr) present.     Comments: See HPI for foot exam if done  Skin:    General: Skin is warm and dry.     Findings: No rash.  Neurological:     Mental Status: He is alert.  Psychiatric:        Mood and Affect: Mood normal.        Behavior: Behavior normal.       Results for orders placed or performed in visit on 08/09/19  Hemoglobin A1c  Result Value Ref Range   Hgb A1c MFr Bld 5.9 4.6 - 6.5 %  Serum protein electrophoresis with reflex  Result Value Ref Range   Total Protein 6.5 6.1 - 8.1 g/dL   Albumin ELP 4.0 3.8 - 4.8 g/dL   Alpha 1 0.3 0.2 - 0.3 g/dL   Alpha 2 0.6 0.5 - 0.9 g/dL   Beta Globulin 0.5 0.4 - 0.6 g/dL   Beta 2 0.3 0.2 - 0.5 g/dL   Gamma Globulin 0.8 0.8 - 1.7 g/dL   Abnormal Protein Band1 NOTE NONE DETEC g/dL   SPE Interp.    IFE Interpretation  Result Value Ref Range   Immunofix Electr Int NO MONOCLONAL PROTEIN DETECTED    Assessment & Plan:  This visit occurred during the SARS-CoV-2 public health emergency.  Safety protocols were in place, including screening questions prior to the visit, additional usage of staff PPE, and extensive cleaning of exam room while observing appropriate contact time as indicated for disinfecting solutions.   Problem List Items Addressed This Visit    Peripheral neuropathy - Primary    Ongoing positional, does better when sleeps in recliner. Likely due to lumbar stenosis. Pending lumbar MRI and will refer to PM&R for further eval.  Check contrasted given h/o lumbar surgeries to  evaluate for scarring.       Diminished pulses in lower extremity    Check ABIs.       Relevant Orders   VAS [place] ABI WITH/WO TBI   Burning sensation of foot    Ongoing positional, does better when sleeps in recliner. Likely due to lumbar stenosis. Pending lumbar MRI  and will refer to PM&R for further eval.        Other Visit Diagnoses    Spinal stenosis of lumbosacral region       Relevant Orders   MR Lumbar Spine W Wo Contrast       No orders of the defined types were placed in this encounter.  Orders Placed This Encounter  Procedures  . MR Lumbar Spine W Wo Contrast    Standing Status:   Future    Standing Expiration Date:   08/15/2020    Order Specific Question:   ** REASON FOR EXAM (FREE TEXT)    Answer:   periph neuropathy, LSS    Order Specific Question:   If indicated for the ordered procedure, I authorize the administration of contrast media per Radiology protocol    Answer:   Yes    Order Specific Question:   What is the patient's sedation requirement?    Answer:   No Sedation    Order Specific Question:   Does the patient have a pacemaker or implanted devices?    Answer:   No    Order Specific Question:   Radiology Contrast Protocol - do NOT remove file path    Answer:   [email or web address]    Order Specific Question:   Preferred imaging location?    Answer:   Product/process development scientist (table limit-350lbs)   Patient Instructions  Touch base with Rosaria Ferries about sooner MRI.  I will go ahead and place referral for PM&R evaluation for peripheral neuropathy likely coming from lumbar stenosis.  I will also order arterial pressures checked at your heart doctor's office.  Return in 6 months for physical.     Follow up plan: Return in about 6 months (around 02/16/2020) for annual exam, prior fasting for blood work.  [name], MD

## 2019-08-16 NOTE — Assessment & Plan Note (Signed)
Check ABIs. 

## 2019-08-22 ENCOUNTER — Other Ambulatory Visit: Payer: Self-pay

## 2019-08-22 ENCOUNTER — Ambulatory Visit (INDEPENDENT_AMBULATORY_CARE_PROVIDER_SITE_OTHER): Payer: Medicare Other

## 2019-08-22 DIAGNOSIS — M4807 Spinal stenosis, lumbosacral region: Secondary | ICD-10-CM

## 2019-08-22 MED ORDER — GADOBUTROL 1 MMOL/ML IV SOLN
9.5000 mL | Freq: Once | INTRAVENOUS | Status: AC | PRN
  Administered 2019-08-22: 9.5 mL via INTRAVENOUS

## 2019-08-23 ENCOUNTER — Encounter: Payer: Self-pay | Admitting: Family Medicine

## 2019-08-23 ENCOUNTER — Other Ambulatory Visit: Payer: Self-pay | Admitting: Family Medicine

## 2019-08-23 NOTE — Addendum Note (Signed)
Addended by: [name] on: 08/23/2019 08:03 AM   Modules accepted: Orders

## 2019-08-26 ENCOUNTER — Encounter: Payer: Self-pay | Admitting: Family Medicine

## 2019-08-26 NOTE — Telephone Encounter (Signed)
appt scheduled for this coming Monday.

## 2019-08-27 ENCOUNTER — Other Ambulatory Visit: Payer: Medicare Other

## 2019-08-30 ENCOUNTER — Encounter: Payer: Self-pay | Admitting: Family Medicine

## 2019-09-02 ENCOUNTER — Encounter (HOSPITAL_COMMUNITY): Payer: Medicare Other

## 2019-09-06 ENCOUNTER — Other Ambulatory Visit: Payer: Self-pay

## 2019-09-06 ENCOUNTER — Ambulatory Visit (HOSPITAL_COMMUNITY)
Admission: RE | Admit: 2019-09-06 | Discharge: 2019-09-06 | Disposition: A | Discharge status: Home / Self Care | Payer: Medicare Other | Source: Ambulatory Visit | Attending: Cardiology | Admitting: Cardiology

## 2019-09-06 DIAGNOSIS — R0989 Other specified symptoms and signs involving the circulatory and respiratory systems: Secondary | ICD-10-CM

## 2019-09-20 ENCOUNTER — Encounter: Payer: Self-pay | Admitting: Family Medicine

## 2019-09-20 DIAGNOSIS — R0989 Other specified symptoms and signs involving the circulatory and respiratory systems: Secondary | ICD-10-CM

## 2019-09-22 NOTE — Telephone Encounter (Signed)
Replied via imaging result.  

## 2019-09-30 ENCOUNTER — Encounter: Payer: Self-pay | Admitting: Cardiovascular Disease

## 2019-09-30 ENCOUNTER — Ambulatory Visit (INDEPENDENT_AMBULATORY_CARE_PROVIDER_SITE_OTHER): Payer: Medicare Other | Admitting: Cardiovascular Disease

## 2019-09-30 ENCOUNTER — Other Ambulatory Visit: Payer: Self-pay

## 2019-09-30 VITALS — BP 132/80 | HR 54 | Temp 96.4°F | Ht 71.0 in | Wt 215.0 lb

## 2019-09-30 DIAGNOSIS — I10 Essential (primary) hypertension: Secondary | ICD-10-CM

## 2019-09-30 DIAGNOSIS — I2510 Atherosclerotic heart disease of native coronary artery without angina pectoris: Secondary | ICD-10-CM

## 2019-09-30 DIAGNOSIS — I480 Paroxysmal atrial fibrillation: Secondary | ICD-10-CM

## 2019-09-30 DIAGNOSIS — R0602 Shortness of breath: Secondary | ICD-10-CM | POA: Diagnosis not present

## 2019-09-30 DIAGNOSIS — Z9861 Coronary angioplasty status: Secondary | ICD-10-CM

## 2019-09-30 DIAGNOSIS — I493 Ventricular premature depolarization: Secondary | ICD-10-CM

## 2019-09-30 NOTE — Patient Instructions (Signed)
Medication Instructions:  Your physician recommends that you continue on your current medications as directed. Please refer to the Current Medication list given to you today.  *If you need a refill on your cardiac medications before your next appointment, please call your pharmacy*   Lab Work: NONE   Testing/Procedures: Your physician has requested that you have an echocardiogram. Echocardiography is a painless test that uses sound waves to create images of your heart. It provides your doctor with information about the size and shape of your heart and how well your heart's chambers and valves are working. This procedure takes approximately one hour. There are no restrictions for this procedure.  Follow-Up: At [facility], you and your health needs are our priority.  As part of our continuing mission to provide you with exceptional heart care, we have created designated Provider Care Teams.  These Care Teams include your primary Cardiologist (physician) and Advanced Practice Providers (APPs -  Physician Assistants and Nurse Practitioners) who all work together to provide you with the care you need, when you need it.  We recommend signing up for the patient portal called "MyChart".  Sign up information is provided on this After Visit Summary.  MyChart is used to connect with patients for Virtual Visits (Telemedicine).  Patients are able to view lab/test results, encounter notes, upcoming appointments, etc.  Non-urgent messages can be sent to your provider as well.   To learn more about what you can do with MyChart, go to [email or web address].    Your next appointment:   6 month(s)  The format for your next appointment:   In Person  Provider:   You may see Skeet Latch, MD or one of the following Advanced Practice Providers on your designated Care Team:    Kerin Ransom, PA-C  [place], [place]  Coletta Memos, [place]  Other Instructions  OK TO [place]  EXTRACTION BUT HOLD PLAVIX FOR 3 DAYS PRIOR

## 2019-09-30 NOTE — Progress Notes (Signed)
Cardiology Office Note   Date:  09/30/2019   ID:  CLEM WISENBAKER, DOB November 28, 1934, MRN [id number]  PCP:  [name], MD  Cardiologist:  Skeet Latch, MD  Electrophysiologist:  Constance Haw, MD   Evaluation Performed:  Follow-Up Visit  Chief Complaint:  Atrial fibrillation, neuropathy  History of Present Illness:     NYZIR DUBOIS is a 85 y.o. male with CAD s/p LAD PCI, chronic diastolic heart failure, hypertension, hyperlipidemia, persistent atrial fibrillation s/p ablation, asthma and Rheumatic fever who presents for follow up.  Mr. Traynham was previously a patient of Dr. Mare Ferrari.  Mr. Dalby was diagnosed with atrial fibrillation in 2014.  He was initially on Pradaxa and switched to Xarelto.  He underwent ablation on 12/19/15 with Dr. [name] and has remained in sinus rhythm.  He had an episode of chest pain 08/2014 and had a Lexiscan Myoview 08/31/14 that showed an old inferior scar with peri-infarct ischemia and LVEF 49%.  He then had a LHC that revealed 70% ostial LAD, 50% prox LAD, 30% OM1 and 35% RCA.  He was managed medically.  He continued to report exertional symptoms despite medical management. He had a LHC with Dr. Ellyn Hack on 09/28/15 that again revealed a 70% ostial-proximal LAD lesion.  Dr. Ellyn Hack performed FFR on that lesion and there was no significant change.  The left ventriculogram performed at that time revealed LVEF 35-45%.  This subsequently improved to 50% on 03/2018.  He again underwent left heart cath on 01/10/2019 and there was progression of his LAD lesion.  He underwent atherectomy and PCI with drug-eluting stent placement 01/11/2019.  Since then he has noted an improvement in his breathing.  He is back to playing golf a couple days per week.  He continues to have some exertional dyspnea but it has improved.  He has no lower extremity edema, orthopnea, or PND.   Atorvastatin was increased to 80 mg but he noted leg pain.  He was seen in the lipid clinic and  started back on atorvastatin 40mg  and zetia 10 mg.    Mr. Morriss underwent L knee replacement on  02/09/17.  Xarelto was held for 2 days prior to surgery and resumed post-operatively.  He developed bleeding at the incision site so Xarelto was again held and he was switched to aspirin.  He subsequently developed a left lower extremity DVT and pulmonary embolism.  Xarelto was resumed. He has been struggling with peripheral neuropathy.  It started a couple weeks after the cath and he thought it was related to Plavix.  He saw Dr. [name] and switched from clopidogrel and and Xarelto to ticagrelor and Eliquis.  His neuropathy did not improve.  He followed up with his PCP and was started on gabapentin and nortriptyline.  At his last appointment Mr. Scantlebury complained of recurrent atrial fibrillation.  He wore a 14-day event monitor that showed some PACs, PVCs, and ventricular bigeminy but no atrial fibrillation.  He continues to struggle with peripheral neuropathy and can't sleep at night.  He has a nerve conduction scheduled in August.  He is unable to sleep and is miserable.  He has no discomfort in his legs when he walks.  He has no LE edema, orthopnea or PND.  His BP at home has been well-controlled.  Lipids at goal     Past Medical History:  Diagnosis Date  . CAP (community acquired pneumonia) 01/19/2018  . History of arthritis   . History of rheumatic fever   .  Hypercholesterolemia   . Hypertension   . Nocturia   . PAF (paroxysmal atrial fibrillation) [facility]) January 2013   Placed on Pradaxa. Did not require cardioversion; spontaneously converted  . Peripheral edema   . Right bundle branch block   . SOB (shortness of breath)   . Tendonitis of elbow, left     Past Surgical History:  Procedure Laterality Date  . ATRIAL FIBRILLATION ABLATION  12/19/2015  . BACK SURGERY  12/24/09   fusion C3-C4  . BACK SURGERY  2010   fusion L4-L5 [facility])  . CARDIAC CATHETERIZATION  2009   NONOBSTRUCTIVE  ATHERSCLEROTIC CORONARY DISEASE AND NORMAL  LV FUNCTION  . CARDIAC CATHETERIZATION N/A 09/08/2014   Procedure: Left Heart Cath and Coronary Angiography;  Surgeon: Belva Crome, MD;  Location: [place] [facility];  Service: Cardiovascular;  Laterality: N/A;  . CARDIAC CATHETERIZATION N/A 09/28/2015   Procedure: Left Heart Cath and Coronary Angiography;  Surgeon: Leonie Man, MD;  Location: [place] [facility];  Service: Cardiovascular;  Laterality: N/A;  . CARDIAC CATHETERIZATION N/A 09/28/2015   Procedure: Intravascular Pressure Wire/FFR Study;  Surgeon: Leonie Man, MD;  Location: [place] [facility];  Service: Cardiovascular;  Laterality: N/A;  . CARDIOVERSION N/A 10/23/2015   Procedure: CARDIOVERSION;  Surgeon: [name], MD;  Location: [facility] ENDOSCOPY;  Service: Cardiovascular;  Laterality: N/A;  . COLONOSCOPY  2013   per patient, rpt 5 yrs  . COLONOSCOPY WITH PROPOFOL N/A 12/02/2018   TAx2, HP, diverticulosis [name], MD)  . CORONARY ATHERECTOMY N/A 01/11/2019   Procedure: CORONARY ATHERECTOMY;  Surgeon: Burnell [name], MD;  Location: [place] [facility];  Service: Cardiovascular;  Laterality: N/A;  . CORONARY STENT INTERVENTION N/A 01/11/2019   Procedure: CORONARY STENT INTERVENTION;  Surgeon: Burnell [name], MD;  Location: [place] [facility];  Service: Cardiovascular;  Laterality: N/A;  . ELECTROPHYSIOLOGIC STUDY N/A 12/19/2015   Procedure: Atrial Fibrillation Ablation;  Surgeon: [name], MD;  Location: [place] [facility];  Service: Cardiovascular;  Laterality: N/A;  . ESOPHAGOGASTRODUODENOSCOPY (EGD) WITH PROPOFOL N/A 12/02/2018   medium HH [name], MD)  . INTRAVASCULAR PRESSURE WIRE/FFR STUDY N/A 01/10/2019   Procedure: INTRAVASCULAR PRESSURE WIRE/FFR STUDY;  Surgeon: Leonie Man, MD;  Location: [place] [facility];  Service: Cardiovascular;  Laterality: N/A;  . KNEE ARTHROSCOPY Left 03/2016   Dr. [name]  . LEFT HEART CATH AND  CORONARY ANGIOGRAPHY N/A 01/10/2019   Procedure: LEFT HEART CATH AND CORONARY ANGIOGRAPHY;  Surgeon: Leonie Man, MD;  Location: [place] [facility];  Service: Cardiovascular;  Laterality: N/A;  . PATELLAR TENDON REPAIR Left 2008  . POLYPECTOMY  12/02/2018   Procedure: POLYPECTOMY;  Surgeon: [name], MD;  Location: [facility] ENDOSCOPY;  Service: Endoscopy;;  . REPLACEMENT TOTAL KNEE Right 2006  . TOTAL KNEE ARTHROPLASTY Left 02/09/2017   Procedure: LEFT TOTAL KNEE ARTHROPLASTY, EXCISION LEFT DISTAL THIGH MASS;  Surgeon: [name], MD;  Location: [facility] ORS;  Service: Orthopedics;  Laterality: Left;  90 mins  . TRICEPS TENDON REPAIR Left 2013     Current Outpatient Medications  Medication Sig Dispense Refill  . acetaminophen (TYLENOL) 500 MG tablet Take 1 tablet (500 mg total) by mouth 3 (three) times daily as needed. 30 tablet 0  . amoxicillin (AMOXIL) 500 MG tablet amoxicillin 500 mg tablet  TAKE 4 TABLETS BY MOUTH 1 HOUR PRIOR TO DENTAL APPOINTMENT    . Ascorbic Acid (VITAMIN C PO) Take 500 mg by mouth daily.     [name]  atorvastatin (LIPITOR) 40 MG tablet Take 1 tablet (40 mg total) by mouth daily. 90 tablet 3  . budesonide-formoterol (SYMBICORT) 160-4.5 MCG/ACT inhaler Inhale 2 puffs into the lungs 2 (two) times daily. 3 Inhaler 3  . Cholecalciferol (VITAMIN D3) 2000 units TABS Take 2,000 Units by mouth daily.    . clopidogrel (PLAVIX) 75 MG tablet Take 1 tablet (75 mg total) by mouth daily. 90 tablet 3  . ezetimibe (ZETIA) 10 MG tablet Take 1 tablet (10 mg total) by mouth daily. 90 tablet 3  . furosemide (LASIX) 20 MG tablet Take 1 tablet (20 mg total) by mouth daily. 90 tablet 3  . gabapentin (NEURONTIN) 300 MG capsule Take 600 mg by mouth at bedtime.     . metoprolol succinate (TOPROL-XL) 50 MG 24 hr tablet Take 3 tablets (150 mg total) by mouth daily. Take with or immediately following a meal. 270 tablet 2  . nitroGLYCERIN (NITROSTAT) 0.4 MG SL tablet Place 1 tablet (0.4 mg total)  under the tongue every 5 (five) minutes as needed. 25 tablet 2  . pantoprazole (PROTONIX) 40 MG tablet Take 1 tablet (40 mg total) by mouth daily. 90 tablet 3  . potassium chloride (KLOR-CON) 10 MEQ tablet TAKE 1 TABLET TWICE A DAY 180 tablet 3  . rivaroxaban (XARELTO) 20 MG TABS tablet Take 1 tablet (20 mg total) by mouth daily with supper. 90 tablet 3  . Spacer/Aero-Holding Chambers (AEROCHAMBER MV) inhaler Use as instructed 1 each 0  . valsartan (DIOVAN) 320 MG tablet Take 1 tablet (320 mg total) by mouth daily. 90 tablet 3  . vitamin B-12 (CYANOCOBALAMIN) 1000 MCG tablet Take 1 tablet (1,000 mcg total) by mouth daily.     No current facility-administered medications for this visit.    Allergies:   Penicillins, Amlodipine, Effexor [venlafaxine], Lisinopril, Mevacor [lovastatin], Plavix [clopidogrel], Vicodin [hydrocodone-acetaminophen], Zocor [simvastatin], and Oseltamivir phosphate    Social History:  The patient  reports that he has never smoked. He has never used smokeless tobacco. He reports current alcohol use of about 14.0 standard drinks of alcohol per week. He reports that he does not use drugs.   Family History:  The patient's family history includes Asthma in his sister; COPD in his brother and father; Heart attack in his father; Heart disease in his brother; Hypertension in his father.    ROS:  Please see the history of present illness.   Otherwise, review of systems are positive for none.   All other systems are reviewed and negative.    PHYSICAL EXAM: VS:  BP 132/80   Pulse (!) 54   Temp (!) 96.4 F (35.8 C)   Ht 5\' 11"  (1.803 m)   Wt 215 lb (97.5 kg)   SpO2 95%   BMI 29.99 kg/m  , BMI Body mass index is 29.99 kg/m. GENERAL:  Well appearing HEENT: Pupils equal round and reactive, fundi not visualized, oral mucosa unremarkable NECK:  No jugular venous distention, waveform within normal limits, carotid upstroke brisk and symmetric, no bruits LUNGS:  Mild rhonchi at R  base HEART:  RRR.  PMI not displaced or sustained,S1 and S2 within normal limits, no S3, no S4, no clicks, no rubs, no murmurs ABD:  Flat, positive bowel sounds normal in frequency in pitch, no bruits, no rebound, no guarding, no midline pulsatile mass, no hepatomegaly, no splenomegaly EXT:  2+ L DP/PT. Unable to palpate R. no edema, no cyanosis no clubbing SKIN:  No rashes no nodules NEURO:  Cranial nerves  II through XII grossly intact, motor grossly intact throughout [facility]:  Cognitively intact, oriented to person place and time   EKG:  EKG is not ordered today. 10/30/15: Sinus rhythm.  Rate 60 bpm.  RBBB.  LAD.  The ekg ordered 09/25/15 demonstrates atrial fibrillation.  Rate 84 bpm.   Echo 03/25/18: IMPRESSIONS    1. The left ventricle has a visually estimated ejection fraction of of 50%. The cavity size was normal. There is mildly increased left ventricular wall thickness. Left ventricular diastolic Doppler parameters are consistent with impaired relaxation Left  ventricular diffuse hypokinesis.  2. The right ventricle has mildly reduced systolic function. The cavity was mildly enlarged. There is no increase in right ventricular wall thickness.  3. Left atrial size was mildly dilated.  4. Right atrial size was mildly dilated.  5. The mitral valve is normal in structure. No evidence of mitral valve stenosis. Trivial regurgitation.  6. The tricuspid valve is normal in structure.  7. The aortic valve is tricuspid There is mild calcification of the aortic valve. Aortic valve regurgitation is trivial by color flow Doppler. No stenosis.  8. The pulmonic valve was normal in structure.  9. There is mild dilatation of the aortic root measuring 38 mm. 10. Right atrial pressure is estimated at 3 mmHg. 11. No complete TR doppler jet so unable to estimate PA systolic pressure.  Lexiscan Myoview 10/17/15:  The left ventricular ejection fraction is moderately decreased (30-44%).  Nuclear stress  EF: 39%.  There was no ST segment deviation noted during stress.  There is a small defect of moderate severity present in the apical lateral and apex location and medium sized defect of moderate severity in the basal inferior, mild inferior and apical inferior location. These defects are fixed and consistent with scar. No ischemia noted.  This is an intermediate risk study due to LV dysfunction.  LHC 01/10/19:  Prox LAD lesion is 80% stenosed. Prox LAD to Mid LAD lesion is 60% stenosed with 50% stenosed side branch in 2nd Diag.  Dist LAD lesion is 45% stenosed.  Ost Cx to Prox Cx lesion is 10% stenosed. 1st Mrg lesion is 30% stenosed. Prox Cx to Mid Cx lesion is 30% stenosed.  -----------  Dist RCA lesion is 30% stenosed with 30% stenosed side branch in RPDA.  -----------  The left ventricular systolic function is normal. The left ventricular ejection fraction is 55-65% by visual estimate.  Diagnostic Dominance: Right   PCI 01/11/19:  Prox LAD to Mid LAD lesion is 80% stenosed.  A drug-eluting stent was successfully placed using a STENT RESOLUTE ONYX 3.5X18.  Post intervention, there is a 0% residual stenosis.  Mid LAD lesion is 70% stenosed.  A drug-eluting stent was successfully placed using a STENT RESOLUTE ONYX 2.75X26.  Post intervention, there is a 0% residual stenosis.   1. Severe, heavily calcified stenoses in the proximal and mid LAD 2. Successful orbital atherectomy of the proximal and mid LAD 3. Successful placement of 2 drug eluting stents in the proximal and mid LAD (overlapping)  Recommendations: Continue ASA and Plavix for at least six months.   14 Day Event Monitor 07/2019:  Quality: Fair.  Baseline artifact. Predominant rhythm: sinus rhythm Average heart rate: 69 bpm Max heart rate: 113 bpm Min heart rate: 52 bpm  PACs, PVCs and ventricular bigeminy.   Recent Labs: 01/18/2019: ALT 20 03/09/2019: Hemoglobin 14.0; Platelets 168.0; TSH  1.76 07/19/2019: BUN 20; Creatinine, Ser 1.06; Potassium 4.5; Sodium 131  Lipid Panel    Component Value Date/Time   CHOL 141 01/18/2019 0756   CHOL 121 07/29/2016 0822   TRIG 70.0 01/18/2019 0756   HDL 59.10 01/18/2019 0756   HDL 56 07/29/2016 0822   CHOLHDL 2 01/18/2019 0756   VLDL 14.0 01/18/2019 0756   LDLCALC 68 01/18/2019 0756   LDLCALC 56 07/29/2016 0822      Wt Readings from Last 3 Encounters:  09/30/19 215 lb (97.5 kg)  08/16/19 214 lb 4 oz (97.2 kg)  08/09/19 216 lb (98 kg)      ASSESSMENT AND PLAN:  # Paroxysmal atrial fibrillation: Currently in sinus rhythm.  S/p ablation with Dr. [name].  ContinueXarelto and metoprolol.   This patients CHA2DS2-VASc Score and unadjusted Ischemic Stroke Rate (% per year) is equal to 7.2 % stroke rate/year from a score of 5  Above score calculated as 1 point each if present [CHF, HTN, DM, Vascular=MI/PAD/Aortic Plaque, Age if 65-74, or Male] Above score calculated as 2 points each if present [Age > 75, or Stroke/TIA/TE]   # PVCs: He denies recent palpitations.  Continue metoprolol.  # Pulmonary embolism: # Pulmonary hypertension: # L LE DVT:  Occurred in the setting of knee replacement.  Resolved.  He is on Xarelto for atrial fibrillation.    # CAD:  # Hyperlipidemia:   Mr. Vanaman is doing better after his LAD PCI.  He had peripheral neuropathy that he thought was attributable to Plavix it was switched to ticagrelor.  However it did not improve.  We will switch back to Plavix.  Continue Plavix and Xarelto.  He can be on Xarelto only starting 01/11/2020.  Continue atorvastatin, Zetia, and metoprolol.  LDL was at goal at 12/20.  # Chronic systolic and diastolic heart failure: # Hypertension: LVEF improved to 50% on 03/2018.  Blood pressure well-controlled on metoprolol and valsartan.  He is euvolemic but short of breath.  Repeat echo.   # Chronic bronchitis: Management per Dr. Annamaria Boots.   Current medicines are reviewed at  length with the patient today.  The patient does not have concerns regarding medicines.  The following changes have been made: none  Labs/ tests ordered today include:   Orders Placed This Encounter  Procedures  . ECHOCARDIOGRAM COMPLETE     Disposition:   FU with Shakaria Raphael C. [name], MD, [facility] in 6 months.      Signed, Audriana Aldama C. [name], MD, [facility]  09/30/2019 12:09 PM    [place]

## 2019-10-06 ENCOUNTER — Ambulatory Visit (HOSPITAL_COMMUNITY): Payer: Medicare Other | Attending: Cardiology

## 2019-10-06 DIAGNOSIS — R0602 Shortness of breath: Secondary | ICD-10-CM | POA: Insufficient documentation

## 2019-10-06 LAB — ECHOCARDIOGRAM COMPLETE
Area-P 1/2: 3.13 cm2
P 1/2 time: 780 msec
S' Lateral: 4 cm

## 2019-10-06 MED ORDER — PERFLUTREN LIPID MICROSPHERE
1.0000 mL | INTRAVENOUS | Status: AC | PRN
  Administered 2019-10-06: 1 mL via INTRAVENOUS

## 2019-10-12 ENCOUNTER — Other Ambulatory Visit: Payer: Self-pay | Admitting: Family Medicine

## 2019-10-14 ENCOUNTER — Other Ambulatory Visit: Payer: Self-pay | Admitting: Family Medicine

## 2019-10-18 ENCOUNTER — Telehealth: Payer: Self-pay

## 2019-10-18 MED ORDER — GABAPENTIN 300 MG PO CAPS: 600.0000 mg | ORAL_CAPSULE | Freq: Every day | ORAL | 1 refills | Status: DC

## 2019-10-18 NOTE — Telephone Encounter (Signed)
Spoke with pt explaining the request being sent is for 100 mg cap and according to our records, he takes 300 mg cap.  Pt confirms he takes 300 mg caps and realizes he was calling in request from old rx # of wrong dose.  Gabapentin Last OV: 08/16/19, 6 mo f/u Next OV: 02/06/20, AWV prt 2

## 2019-10-18 NOTE — Telephone Encounter (Signed)
Refilled

## 2019-10-18 NOTE — Telephone Encounter (Signed)
Pt is calling asking why his gabapentin rx is not being filled. Pharmacy is telling him there is not response from [place]. Our end says duplicate request. Please advise at [phone].

## 2019-10-25 ENCOUNTER — Other Ambulatory Visit: Payer: Self-pay

## 2019-10-25 ENCOUNTER — Ambulatory Visit (INDEPENDENT_AMBULATORY_CARE_PROVIDER_SITE_OTHER): Payer: Medicare Other | Admitting: Cardiology

## 2019-10-25 ENCOUNTER — Encounter: Payer: Self-pay | Admitting: Cardiology

## 2019-10-25 ENCOUNTER — Other Ambulatory Visit: Payer: Self-pay | Admitting: Cardiology

## 2019-10-25 VITALS — BP 134/84 | HR 71 | Ht 71.0 in | Wt 216.0 lb

## 2019-10-25 DIAGNOSIS — I4819 Other persistent atrial fibrillation: Secondary | ICD-10-CM | POA: Diagnosis not present

## 2019-10-25 NOTE — Patient Instructions (Signed)
Medication Instructions:  °Your physician recommends that you continue on your current medications as directed. Please refer to the Current Medication list given to you today. ° °*If you need a refill on your cardiac medications before your next appointment, please call your pharmacy* ° ° °Lab Work: °None ordered ° ° °Testing/Procedures: °None ordered ° ° °Follow-Up: °At [facility], you and your health needs are our priority.  As part of our continuing mission to provide you with exceptional heart care, we have created designated Provider Care Teams.  These Care Teams include your primary Cardiologist (physician) and Advanced Practice Providers (APPs -  Physician Assistants and Nurse Practitioners) who all work together to provide you with the care you need, when you need it. ° °Your next appointment:   °6 month(s) ° °The format for your next appointment:   °In Person ° °Provider:   °Will Camnitz, MD ° ° ° °Thank you for choosing [facility]!! ° ° °Jyaire Koudelka, RN °[phone] °  °

## 2019-10-25 NOTE — Progress Notes (Signed)
Electrophysiology Office Note   Date:  10/25/2019   ID:  Theodore [name], DOB 03/23/1934, MRN [id number]  PCP:  [name], MD  Cardiologist:  Orlinda Blalock Electrophysiologist:  Dr [name]    CC: Follow up for persistent atrial fibrillation   History of Present Illness: Theodore [name] is a 85 y.o. male who presents today for electrophysiology evaluation.   He has a history significant for hypertension, hyperlipidemia, persistent atrial fibrillation, and rheumatic fever.  He was diagnosed with atrial fibrillation in 2014.  He is initially on Pradaxa but switched to Xarelto.  He had an episode of chest pain in 2016 with a Myoview showing inferior scar and peri-infarct ischemia.  He had a left heart catheterization that showed a 70%.  He had an atrial fibrillation ablation 12/19/2015.  Recent heart catheterization showed an 80% LAD lesion and he is now status post stenting.  Today, denies symptoms of palpitations, chest pain, shortness of breath, orthopnea, PND, lower extremity edema, claudication, dizziness, presyncope, syncope, bleeding, or neurologic sequela. The patient is tolerating medications without difficulties.  Overall he is doing well.  He has no chest pain or shortness of breath.  Patient wore a cardiac monitor that showed 2% PVCs.  Aside from that, he is able to do all of his daily activities.  He has plans for potential back injection, oral surgery, and skin surgery.  Past Medical History:  Diagnosis Date  . CAP (community acquired pneumonia) 01/19/2018  . History of arthritis   . History of rheumatic fever   . Hypercholesterolemia   . Hypertension   . Nocturia   . PAF (paroxysmal atrial fibrillation) [facility]) January 2013   Placed on Pradaxa. Did not require cardioversion; spontaneously converted  . Peripheral edema   . Right bundle branch block   . SOB (shortness of breath)   . Tendonitis of elbow, left    Past Surgical History:  Procedure Laterality Date  .  ATRIAL FIBRILLATION ABLATION  12/19/2015  . BACK SURGERY  12/24/09   fusion C3-C4  . BACK SURGERY  2010   fusion L4-L5 [facility])  . CARDIAC CATHETERIZATION  2009   NONOBSTRUCTIVE ATHERSCLEROTIC CORONARY DISEASE AND NORMAL  LV FUNCTION  . CARDIAC CATHETERIZATION N/A 09/08/2014   Procedure: Left Heart Cath and Coronary Angiography;  Surgeon: Belva Crome, MD;  Location: [place] [facility];  Service: Cardiovascular;  Laterality: N/A;  . CARDIAC CATHETERIZATION N/A 09/28/2015   Procedure: Left Heart Cath and Coronary Angiography;  Surgeon: Leonie Man, MD;  Location: [place] [facility];  Service: Cardiovascular;  Laterality: N/A;  . CARDIAC CATHETERIZATION N/A 09/28/2015   Procedure: Intravascular Pressure Wire/FFR Study;  Surgeon: Leonie Man, MD;  Location: [place] [facility];  Service: Cardiovascular;  Laterality: N/A;  . CARDIOVERSION N/A 10/23/2015   Procedure: CARDIOVERSION;  Surgeon: [name], MD;  Location: [facility] ENDOSCOPY;  Service: Cardiovascular;  Laterality: N/A;  . COLONOSCOPY  2013   per patient, rpt 5 yrs  . COLONOSCOPY WITH PROPOFOL N/A 12/02/2018   TAx2, HP, diverticulosis [name], MD)  . CORONARY ATHERECTOMY N/A 01/11/2019   Procedure: CORONARY ATHERECTOMY;  Surgeon: Burnell [name], MD;  Location: [place] [facility];  Service: Cardiovascular;  Laterality: N/A;  . CORONARY STENT INTERVENTION N/A 01/11/2019   Procedure: CORONARY STENT INTERVENTION;  Surgeon: Burnell [name], MD;  Location: [place] [facility];  Service: Cardiovascular;  Laterality: N/A;  . ELECTROPHYSIOLOGIC STUDY N/A 12/19/2015   Procedure: Atrial Fibrillation Ablation;  Surgeon: Auriah Hollings Meredith Leeds, MD;  Location: [place] [facility];  Service: Cardiovascular;  Laterality: N/A;  . ESOPHAGOGASTRODUODENOSCOPY (EGD) WITH PROPOFOL N/A 12/02/2018   medium HH [name], MD)  . INTRAVASCULAR PRESSURE WIRE/FFR STUDY N/A 01/10/2019   Procedure: INTRAVASCULAR PRESSURE WIRE/FFR STUDY;   Surgeon: Leonie Man, MD;  Location: [place] [facility];  Service: Cardiovascular;  Laterality: N/A;  . KNEE ARTHROSCOPY Left 03/2016   Dr. [name]  . LEFT HEART CATH AND CORONARY ANGIOGRAPHY N/A 01/10/2019   Procedure: LEFT HEART CATH AND CORONARY ANGIOGRAPHY;  Surgeon: Leonie Man, MD;  Location: [place] [facility];  Service: Cardiovascular;  Laterality: N/A;  . PATELLAR TENDON REPAIR Left 2008  . POLYPECTOMY  12/02/2018   Procedure: POLYPECTOMY;  Surgeon: [name], MD;  Location: [facility] ENDOSCOPY;  Service: Endoscopy;;  . REPLACEMENT TOTAL KNEE Right 2006  . TOTAL KNEE ARTHROPLASTY Left 02/09/2017   Procedure: LEFT TOTAL KNEE ARTHROPLASTY, EXCISION LEFT DISTAL THIGH MASS;  Surgeon: [name], MD;  Location: [facility] ORS;  Service: Orthopedics;  Laterality: Left;  90 mins  . TRICEPS TENDON REPAIR Left 2013     Current Outpatient Medications  Medication Sig Dispense Refill  . acetaminophen (TYLENOL) 500 MG tablet Take 1 tablet (500 mg total) by mouth 3 (three) times daily as needed. 30 tablet 0  . amoxicillin (AMOXIL) 500 MG tablet amoxicillin 500 mg tablet  TAKE 4 TABLETS BY MOUTH 1 HOUR PRIOR TO DENTAL APPOINTMENT    . Ascorbic Acid (VITAMIN C PO) Take 500 mg by mouth daily.     [name] atorvastatin (LIPITOR) 40 MG tablet Take 1 tablet (40 mg total) by mouth daily. 90 tablet 3  . budesonide-formoterol (SYMBICORT) 160-4.5 MCG/ACT inhaler Inhale 2 puffs into the lungs 2 (two) times daily. 3 Inhaler 3  . Cholecalciferol (VITAMIN D3) 2000 units TABS Take 2,000 Units by mouth daily.    . clopidogrel (PLAVIX) 75 MG tablet Take 1 tablet (75 mg total) by mouth daily. 90 tablet 3  . ezetimibe (ZETIA) 10 MG tablet Take 1 tablet (10 mg total) by mouth daily. 90 tablet 3  . furosemide (LASIX) 20 MG tablet Take 1 tablet (20 mg total) by mouth daily. 90 tablet 3  . gabapentin (NEURONTIN) 300 MG capsule Take 2 capsules (600 mg total) by mouth at bedtime. 180 capsule 1  . metoprolol succinate  (TOPROL-XL) 50 MG 24 hr tablet TAKE 3 TABLETS (150 MG  TOTAL) DAILY.TAKE WITH OR  IMMEDIATELY FOLLOWING A  MEAL 270 tablet 3  . nitroGLYCERIN (NITROSTAT) 0.4 MG SL tablet Place 1 tablet (0.4 mg total) under the tongue every 5 (five) minutes as needed. 25 tablet 2  . pantoprazole (PROTONIX) 40 MG tablet TAKE 1 TABLET BY MOUTH  DAILY 90 tablet 3  . potassium chloride (KLOR-CON) 10 MEQ tablet TAKE 1 TABLET TWICE A DAY 180 tablet 3  . rivaroxaban (XARELTO) 20 MG TABS tablet Take 1 tablet (20 mg total) by mouth daily with supper. 90 tablet 3  . Spacer/Aero-Holding Chambers (AEROCHAMBER MV) inhaler Use as instructed 1 each 0  . valsartan (DIOVAN) 320 MG tablet Take 1 tablet (320 mg total) by mouth daily. 90 tablet 3  . vitamin B-12 (CYANOCOBALAMIN) 1000 MCG tablet Take 1 tablet (1,000 mcg total) by mouth daily.     No current facility-administered medications for this visit.    Allergies:   Penicillins, Amlodipine, Effexor [venlafaxine], Lisinopril, Mevacor [lovastatin], Plavix [clopidogrel], Vicodin [hydrocodone-acetaminophen], Zocor [simvastatin], and Oseltamivir phosphate   Social History:  The patient  reports that he has never smoked. He has never used smokeless tobacco. He reports current alcohol use of about 14.0 standard drinks of alcohol per week. He reports that he does not use drugs.   Family History:  The patient's family history includes Asthma in his sister; COPD in his brother and father; Heart attack in his father; Heart disease in his brother; Hypertension in his father.    ROS:  Please see the history of present illness.   Otherwise, review of systems is positive for none.   All other systems are reviewed and negative.   PHYSICAL EXAM: VS:  BP 134/84   Pulse 71   Ht 5\' 11"  (1.803 m)   Wt 216 lb (98 kg)   SpO2 96%   BMI 30.13 kg/m  , BMI Body mass index is 30.13 kg/m. GEN: Well nourished, well developed, in no acute distress  HEENT: normal  Neck: no JVD, carotid bruits, or  masses Cardiac: RRR; no murmurs, rubs, or gallops,no edema  Respiratory:  clear to auscultation bilaterally, normal work of breathing GI: soft, nontender, nondistended, + BS MS: no deformity or atrophy  Skin: warm and dry Neuro:  Strength and sensation are intact Psych: euthymic mood, full affect  EKG:  EKG is ordered today. Personal review of the ekg ordered shows sinus rhythm bundle-branch block rate 71   Recent Labs: 01/18/2019: ALT 20 03/09/2019: Hemoglobin 14.0; Platelets 168.0; TSH 1.76 07/19/2019: BUN 20; Creatinine, Ser 1.06; Potassium 4.5; Sodium 131    Lipid Panel     Component Value Date/Time   CHOL 141 01/18/2019 0756   CHOL 121 07/29/2016 0822   TRIG 70.0 01/18/2019 0756   HDL 59.10 01/18/2019 0756   HDL 56 07/29/2016 0822   CHOLHDL 2 01/18/2019 0756   VLDL 14.0 01/18/2019 0756   LDLCALC 68 01/18/2019 0756   LDLCALC 56 07/29/2016 0822     Wt Readings from Last 3 Encounters:  10/25/19 216 lb (98 kg)  09/30/19 215 lb (97.5 kg)  08/16/19 214 lb 4 oz (97.2 kg)      Other studies Reviewed: Additional studies/ records that were reviewed today include: Cardiac monitor 07/26/2019 personally reviewed Review of the above records today demonstrates:  Predominant rhythm: sinus rhythm Average heart rate: 69 bpm Max heart rate: 113 bpm Min heart rate: 52 bpm PACs, PVCs and ventricular bigeminy.  TTE 10/06/2019 1. Left ventricular ejection fraction, by estimation, is 60 to 65%. The  left ventricle has normal function. The left ventricle has no regional  wall motion abnormalities. There is moderate concentric left ventricular  hypertrophy. Left ventricular  diastolic parameters are consistent with Grade II diastolic dysfunction  (pseudonormalization).  2. Right ventricular systolic function is normal. The right ventricular  size is mildly enlarged. There is normal pulmonary artery systolic  pressure.  3. Left atrial size was mildly dilated.  4. The mitral  valve is normal in structure. Trivial mitral valve  regurgitation. No evidence of mitral stenosis.  5. The aortic valve has an indeterminant number of cusps. Aortic valve  regurgitation is mild to moderate. No aortic stenosis is present.  6. The inferior vena cava is normal in size with greater than 50%  respiratory variability, suggesting right atrial pressure of 3 mmHg.   ASSESSMENT AND PLAN:  1.  Persistent atrial fibrillation: Status post ablation 12/19/2015.  Currently on metoprolol and Xarelto with a CHA2DS2-VASc of 5.  He is remained in sinus rhythm.  Cardiac monitor recent shows sinus rhythm  with PVCs at 2%.  No changes.   2.  Coronary artery disease: Status post LAD stent.  No current chest pain.    3.  Chronic systolic heart failure: Currently on metoprolol, losartan, Lasix, ejection fraction has since normalized.    4.  Hypertension: Currently well controlled  5.  Hyperlipidemia: Continue Lipitor  6. Ectatic abdominal aorta: Needs repeat abdominal ultrasound for evaluation.  Current medicines are reviewed at length with the patient today.   The patient does not have concerns regarding his medicines.  The following changes were made today: None  Labs/ tests ordered today include:  Orders Placed This Encounter  Procedures  . EKG 12-Lead     Disposition:   FU with Aws Shere 6 months  Signed, Emad Brechtel Meredith Leeds, MD  10/25/2019 3:07 PM     [place] [address] [place] [address] [phone] (office) [phone] (fax)

## 2019-11-01 ENCOUNTER — Encounter: Payer: Self-pay | Admitting: Family Medicine

## 2019-11-01 DIAGNOSIS — I351 Nonrheumatic aortic (valve) insufficiency: Secondary | ICD-10-CM | POA: Insufficient documentation

## 2019-11-08 ENCOUNTER — Encounter: Payer: Self-pay | Admitting: Family Medicine

## 2019-11-10 ENCOUNTER — Other Ambulatory Visit: Payer: Self-pay | Admitting: Neurosurgery

## 2019-11-10 ENCOUNTER — Telehealth: Payer: Self-pay

## 2019-11-10 DIAGNOSIS — M48061 Spinal stenosis, lumbar region without neurogenic claudication: Secondary | ICD-10-CM

## 2019-11-10 NOTE — Telephone Encounter (Signed)
Patient with diagnosis of afib on Xarelto for anticoagulation.    Procedure: LUMBAR FACET INJECTION  Date of procedure: TBD  CHADS2-VASc score of  5 (CHF, HTN, AGE, CAD, AGE)  Of note patient does have a hx of PE/DVT after he held Xarelto for knee surgery. Xarelto was held for 2 days prior to surgery and resumed post-operatively day 7.  He developed bleeding at the incision site so Xarelto was again held and he was switched to aspirin. About 20 days later, he subsequently developed a left lower extremity DVT and pulmonary embolism.  Xarelto was resumed.   CrCl 63 ml/min  Due to patient hx of DVT/PE after holding anticoagulation for surgery I will send to Dr. [name] for input. Patient was off of anticoagulation for an extended period of time after knee surgery. He would ideally need to hold anticoagulation 3 days prior to this procedure. I will defer to Dr. [name]

## 2019-11-10 NOTE — Telephone Encounter (Signed)
   [facility] Pre-operative Risk Assessment    Request for surgical clearance:  1. What type of surgery is being performed? LUMBAR FACET INJECTION   2. When is this surgery scheduled? TBD   3. What type of clearance is required (medical clearance vs. Pharmacy clearance to hold med vs. Both)? PHARMACY  4. Are there any medications that need to be held prior to surgery and how long? PLAVIX(5DAYS)&XARELTO(2DAYS)   5. Practice name and name of physician performing surgery? [facility] ATTN:ROBERTA   6. What is the office phone number? [phone]   7.   What is the office fax number? [phone]  8.   Anesthesia type (None, local, MAC, general) ? LOCAL

## 2019-11-10 NOTE — Telephone Encounter (Signed)
   Primary Cardiologist: Skeet Latch, MD  Chart reviewed as part of pre-operative protocol coverage. Patient was contacted 11/10/2019 in reference to pre-operative risk assessment for pending surgery as outlined below.  Theodore [name] was last seen on 09/30/19 by Dr. [name] and 10/25/19 by EP Dr. [name]. Per chart he has history of CAD s/p PCI, HTN, HLD, persistent atrial fibrillation with prior ablation (in NSR at 9/14 OV), rheumatic fever, ectatic abdominal aorta, chronic combined CHF with subsequent normalization (EF 60-65% in 09/2019), peripheral neuropathy, prior PE/DVT after knee replacement. Patient's last PCI was in 01/2019 (2 drug eluting stents in the proximal and mid LAD, overlapping).  Will route to Dr. [name] for input on holding Plavix for 5 days for lumbar facet injection, as well as pharm team for Xarelto, then patient will need call. Dr. [name] - Please route response to P [facility] DIV PREOP (the pre-op pool). Thank you.   Charlie Pitter, PA-C 11/10/2019, 3:04 PM

## 2019-11-10 NOTE — Telephone Encounter (Signed)
Thank you. Will also await her input on Plavix as below as well.

## 2019-11-14 NOTE — Telephone Encounter (Signed)
OK to hold Plavix.

## 2019-11-15 ENCOUNTER — Telehealth: Payer: Self-pay | Admitting: Internal Medicine

## 2019-11-15 ENCOUNTER — Ambulatory Visit
Admission: RE | Admit: 2019-11-15 | Discharge: 2019-11-15 | Disposition: A | Discharge status: Home / Self Care | Payer: Medicare Other | Source: Ambulatory Visit | Attending: Neurosurgery | Admitting: Neurosurgery

## 2019-11-15 ENCOUNTER — Other Ambulatory Visit: Payer: Self-pay

## 2019-11-15 DIAGNOSIS — M48061 Spinal stenosis, lumbar region without neurogenic claudication: Secondary | ICD-10-CM

## 2019-11-15 MED ORDER — BREZTRI AEROSPHERE 160-9-4.8 MCG/ACT IN AERO: 2.0000 | INHALATION_SPRAY | Freq: Two times a day (BID) | RESPIRATORY_TRACT | 3 refills | Status: DC

## 2019-11-15 MED ORDER — METHYLPREDNISOLONE ACETATE 40 MG/ML INJ SUSP (RADIOLOG
120.0000 mg | Freq: Once | INTRAMUSCULAR | Status: AC
  Administered 2019-11-15: 120 mg via INTRA_ARTICULAR

## 2019-11-15 MED ORDER — IOPAMIDOL (ISOVUE-M 200) INJECTION 41%
1.0000 mL | Freq: Once | INTRAMUSCULAR | Status: AC
  Administered 2019-11-15: 1 mL via INTRA_ARTICULAR

## 2019-11-15 NOTE — Telephone Encounter (Signed)
He is having a lumbar facet injection- does that change any recommendation?

## 2019-11-15 NOTE — Discharge Instructions (Signed)
Post Procedure Spinal Discharge Instruction Sheet  1. You may resume a regular diet and any medications that you routinely take (including pain medications).  2. No driving day of procedure.  3. Light activity throughout the rest of the day.  Do not do any strenuous work, exercise, bending or lifting.  The day following the procedure, you can resume normal physical activity but you should refrain from exercising or physical therapy for at least three days thereafter.   Common Side Effects:   Headaches- take your usual medications as directed by your physician.  Increase your fluid intake.  Caffeinated beverages may be helpful.  Lie flat in bed until your headache resolves.   Restlessness or inability to sleep- you may have trouble sleeping for the next few days.  Ask your referring physician if you need any medication for sleep.   Facial flushing or redness- should subside within a few days.   Increased pain- a temporary increase in pain a day or two following your procedure is not unusual.  Take your pain medication as prescribed by your referring physician.   Leg cramps  Please contact our office at [phone] for the following symptoms:  Fever greater than 100 degrees.  Headaches unresolved with medication after 2-3 days.  Increased swelling, pain, or redness at injection site.  YOU MAY RESTART YOUR PLAVIX TODAY. YOU MAY RESTART YOUR XARELTO TOMORROW 11/16/19 AT YOUR NEXT SCHEDULED DOSE.

## 2019-11-15 NOTE — Telephone Encounter (Signed)
Spoke with the pt  I have [facility]  Nothing further needed

## 2019-11-15 NOTE — Telephone Encounter (Signed)
He can hold Xarelto but restart immediately after colonoscopy or no later than the following day.

## 2019-11-15 NOTE — Telephone Encounter (Signed)
Dr. [name] will you please weigh in anticoagulation as well  patient does have a hx of PE/DVT after he held Xarelto for knee surgery. Xarelto was held for 2 days prior to surgery and resumed post-operatively day 7. He developed bleeding at the incision site so Xarelto was again held and he was switched to aspirin. About 20 days later, he subsequently developed a left lower extremity DVT and pulmonary embolism.  With this history are you ok with him holding Xarelto for 3 days prior to procedure and resuming 24-48 hr after?

## 2019-11-17 NOTE — Telephone Encounter (Signed)
Discusses with Dr [name], she recommends restarting when safe per the physician doing the injection

## 2019-12-13 NOTE — Progress Notes (Signed)
Subjective:    Patient ID: Theodore [name], male    DOB: 08/02/1934, 85 y.o.   MRN: [id number] HPI male never smoker followed for COPD/bronchitis, complicated by HBP, peripheral edema, GERD, A. Fib/ ablation, CAD, PE/DVT,  PFT 2011 FEV1/FVC 0.65.    PFT 11/30/13- moderate obstruction, insignificant response to bronchodilator, airtrapping, minimal diffusion defect. FEV1 2.21/ 70%, FEV1/FVC 0.63, TLC 97%, DLCO 78%. EKG 03/19/2018-sinus bradycardia with frequent multifocal PVCs/ventricular trigeminy PFT 10/15/2018- Moderate obstruction with response to BD, Airtrapping, Normal Diffusion (Chronic bronchitis pattern) -----------------------------------------------------------------------------------------   06/13/19- 85 year old male never smoker followed for COPD/bronchitis, complicated by HBP, peripheral edema, GERD, A. Fib/ Xarelto, PE/DVT (2019), CAD Spiriva, Xarelto, Symbicort 160/ spacer,  -----f/u COPD Mixed type.  He thought Symbicort at night might be causing foot pain and dropped the PM dose. We gave him samples of Breo 100 to try qAM instead, for comparison. Cardiac cath for unstable angina had shown severe CAD> successfull PCI/ stents in December.  Peripheral neuropathy with burning feet if lies in bed at night- trying gabapentin. Had 2 Phizer Covax Nagging cough, white phlegm, unchanged. Didn't like Breo powder. Wants to try Judithann Sauger- his insurance covers now- discussed.  12/14/19- 85 year old male never smoker followed for COPD/bronchitis, complicated by HBP, peripheral edema, GERD, A. Fib/ Xarelto, PE/DVT (2019), CAD/ stent, Peripheral Neuropathy,  Spiriva, Xarelto/ Plavix, Symbicort 160/ spacer,  Covid vax- 3 Phizer Flu vax- had Echo EF 60-65% Denies recent acute issues or changes. He preferred samples Breztri to [employer] and that script was sent.  Peripheral neuropathy- feet burn at night. CXR 06/13/19-  IMPRESSION: No active cardiopulmonary disease.   ROS-see HPI + =  positive Constitutional:   No-   weight loss, night sweats, fevers, chills, fatigue, lassitude. HEENT:   No-  headaches, difficulty swallowing, tooth/dental problems, sore throat,       No-  sneezing, itching, ear ache, nasal congestion, post nasal drip,  CV:  No-   chest pain, orthopnea, PND, swelling in lower extremities, anasarca, dizziness, +palpitations Resp: + shortness of breath with exertion or at rest.              +  productive cough,  non-productive cough,  No- coughing up of blood.              No-   change in color of mucus.  +wheezing.   Skin: No-   rash or lesions. GI:  No-   heartburn, indigestion, abdominal pain, nausea, vomiting,  GU:  MS:  No-   joint pain or swelling.  Neuro-   + feet burn Psych:  No- change in mood or affect. No depression or anxiety.  No memory loss.  OBJ- Physical Exam General- Alert, Oriented, Affect-appropriate, Distress- none acute, looks well Skin- +ecchymoses on arms  Lymphadenopathy- none Head- atraumatic            Eyes- Gross vision intact, PERRLA, conjunctivae and secretions clear            Ears- Hearing, canals-normal            Nose- Clear, no-Septal dev, mucus, polyps, erosion, perforation             Throat- Mallampati II , mucosa clear , drainage- none, tonsils- atrophic,  Neck- flexible , trachea midline, no stridor , thyroid nl, carotid no bruit Chest - symmetrical excursion , unlabored           Heart/CV- RRR , no murmur , no gallop  , no rub, nl  s1 s2                           - JVD- none , edema- none, stasis changes- none, varices- none           Lung-  + few scattered squeaks, wheeze- none, cough- none here, dullness-none, rub- none           Chest wall-  Abd-  Br/ Gen/ Rectal- Not done, not indicated Extrem- cyanosis- none, clubbing, none, atrophy- none, strength- nl Neuro- grossly intact to observation

## 2019-12-14 ENCOUNTER — Ambulatory Visit (INDEPENDENT_AMBULATORY_CARE_PROVIDER_SITE_OTHER): Payer: Medicare Other | Admitting: Internal Medicine

## 2019-12-14 ENCOUNTER — Other Ambulatory Visit: Payer: Self-pay

## 2019-12-14 ENCOUNTER — Encounter: Payer: Self-pay | Admitting: Internal Medicine

## 2019-12-14 DIAGNOSIS — I480 Paroxysmal atrial fibrillation: Secondary | ICD-10-CM | POA: Diagnosis not present

## 2019-12-14 DIAGNOSIS — J449 Chronic obstructive pulmonary disease, unspecified: Secondary | ICD-10-CM

## 2019-12-14 NOTE — Patient Instructions (Addendum)
I'm glad Theodore [name] seems to be working well for you.   Please call if we can help

## 2019-12-14 NOTE — Assessment & Plan Note (Signed)
Sinus rhythm by exam today. Cardiology follows

## 2019-12-14 NOTE — Assessment & Plan Note (Signed)
Uncomplicated, no recent exacerbation. Vaccinated Plan-- continue [employer]

## 2019-12-26 ENCOUNTER — Other Ambulatory Visit: Payer: Self-pay | Admitting: Cardiology

## 2020-01-25 ENCOUNTER — Encounter: Payer: Self-pay | Admitting: Family Medicine

## 2020-01-25 DIAGNOSIS — G609 Hereditary and idiopathic neuropathy, unspecified: Secondary | ICD-10-CM

## 2020-01-29 ENCOUNTER — Other Ambulatory Visit: Payer: Self-pay | Admitting: Family Medicine

## 2020-01-29 DIAGNOSIS — I480 Paroxysmal atrial fibrillation: Secondary | ICD-10-CM

## 2020-01-29 DIAGNOSIS — D509 Iron deficiency anemia, unspecified: Secondary | ICD-10-CM

## 2020-01-29 DIAGNOSIS — E7800 Pure hypercholesterolemia, unspecified: Secondary | ICD-10-CM

## 2020-01-30 ENCOUNTER — Other Ambulatory Visit (INDEPENDENT_AMBULATORY_CARE_PROVIDER_SITE_OTHER): Payer: Medicare Other

## 2020-01-30 ENCOUNTER — Other Ambulatory Visit: Payer: Self-pay

## 2020-01-30 DIAGNOSIS — E7800 Pure hypercholesterolemia, unspecified: Secondary | ICD-10-CM

## 2020-01-30 DIAGNOSIS — I480 Paroxysmal atrial fibrillation: Secondary | ICD-10-CM | POA: Diagnosis not present

## 2020-01-30 LAB — CBC WITH DIFFERENTIAL/PLATELET
Basophils Absolute: 0 10*3/uL (ref 0.0–0.1)
Basophils Relative: 0.4 % (ref 0.0–3.0)
Eosinophils Absolute: 0.1 10*3/uL (ref 0.0–0.7)
Eosinophils Relative: 1.2 % (ref 0.0–5.0)
HCT: 34.4 % — ABNORMAL LOW (ref 39.0–52.0)
Hemoglobin: 11 g/dL — ABNORMAL LOW (ref 13.0–17.0)
Lymphocytes Relative: 31.7 % (ref 12.0–46.0)
Lymphs Abs: 1.8 10*3/uL (ref 0.7–4.0)
MCHC: 32.1 g/dL (ref 30.0–36.0)
MCV: 77.7 fl — ABNORMAL LOW (ref 78.0–100.0)
Monocytes Absolute: 0.6 10*3/uL (ref 0.1–1.0)
Monocytes Relative: 9.8 % (ref 3.0–12.0)
Neutro Abs: 3.3 10*3/uL (ref 1.4–7.7)
Neutrophils Relative %: 56.9 % (ref 43.0–77.0)
Platelets: 169 10*3/uL (ref 150.0–400.0)
RBC: 4.42 Mil/uL (ref 4.22–5.81)
RDW: 18.2 % — ABNORMAL HIGH (ref 11.5–15.5)
WBC: 5.8 10*3/uL (ref 4.0–10.5)

## 2020-01-30 LAB — COMPREHENSIVE METABOLIC PANEL
ALT: 15 U/L (ref 0–53)
AST: 20 U/L (ref 0–37)
Albumin: 4 g/dL (ref 3.5–5.2)
Alkaline Phosphatase: 56 U/L (ref 39–117)
BUN: 16 mg/dL (ref 6–23)
CO2: 29 mEq/L (ref 19–32)
Calcium: 9.1 mg/dL (ref 8.4–10.5)
Chloride: 101 mEq/L (ref 96–112)
Creatinine, Ser: 1.04 mg/dL (ref 0.40–1.50)
GFR: 66.46 mL/min (ref >60.00)
Glucose, Bld: 94 mg/dL (ref 70–99)
Potassium: 4.1 mEq/L (ref 3.5–5.1)
Sodium: 135 mEq/L (ref 135–145)
Total Bilirubin: 0.5 mg/dL (ref 0.2–1.2)
Total Protein: 6.7 g/dL (ref 6.0–8.3)

## 2020-01-30 LAB — LIPID PANEL
Cholesterol: 116 mg/dL (ref 0–200)
HDL: 43.5 mg/dL (ref >39.00)
LDL Cholesterol: 57 mg/dL (ref 0–99)
NonHDL: 72.83
Total CHOL/HDL Ratio: 3
Triglycerides: 78 mg/dL (ref 0.0–149.0)
VLDL: 15.6 mg/dL (ref 0.0–40.0)

## 2020-01-30 MED ORDER — TRAZODONE HCL 50 MG PO TABS: 25.0000 mg | ORAL_TABLET | Freq: Every evening | ORAL | 3 refills | Status: DC | PRN

## 2020-01-30 MED ORDER — NORTRIPTYLINE HCL 25 MG PO CAPS: 25.0000 mg | ORAL_CAPSULE | Freq: Every day | ORAL | 0 refills | Status: DC

## 2020-01-30 NOTE — Addendum Note (Signed)
Addended by: [name] on: 01/30/2020 02:45 PM   Modules accepted: Orders

## 2020-01-30 NOTE — Addendum Note (Signed)
Addended by: [name] on: 01/30/2020 02:46 PM   Modules accepted: Orders

## 2020-01-31 ENCOUNTER — Other Ambulatory Visit: Payer: Self-pay | Admitting: Cardiology

## 2020-02-01 ENCOUNTER — Telehealth: Payer: Self-pay | Admitting: Radiology

## 2020-02-01 ENCOUNTER — Other Ambulatory Visit (INDEPENDENT_AMBULATORY_CARE_PROVIDER_SITE_OTHER): Payer: Medicare Other

## 2020-02-01 DIAGNOSIS — R195 Other fecal abnormalities: Secondary | ICD-10-CM

## 2020-02-01 DIAGNOSIS — D509 Iron deficiency anemia, unspecified: Secondary | ICD-10-CM | POA: Diagnosis not present

## 2020-02-01 DIAGNOSIS — D649 Anemia, unspecified: Secondary | ICD-10-CM

## 2020-02-01 LAB — FECAL OCCULT BLOOD, IMMUNOCHEMICAL: Fecal Occult Bld: POSITIVE — AB

## 2020-02-01 NOTE — Telephone Encounter (Signed)
plz notify pt iFOB returned positive for blood. Recommend return to GI - referral placed. Who did he previously see, was it Dr [name] with [facility]? Would also have him stop plavix at this time - it seems cardiology plan was to stop plavix on 01/11/2020 and continue xarelto alone.

## 2020-02-01 NOTE — Telephone Encounter (Signed)
[facility] called a POSITIVE ifob result. Results sent to Dr Danise Mina

## 2020-02-02 ENCOUNTER — Telehealth: Payer: Self-pay | Admitting: Family Medicine

## 2020-02-02 ENCOUNTER — Encounter: Payer: Self-pay | Admitting: Family Medicine

## 2020-02-02 NOTE — Telephone Encounter (Signed)
Pt calling back asking to be seen at [facility] vs [facility].

## 2020-02-02 NOTE — Telephone Encounter (Signed)
Error EM

## 2020-02-02 NOTE — Telephone Encounter (Signed)
Lvm (on cell # and home #) asking pt to call back.  Need to relay results, Dr. Synthia Innocent message and get answer to Dr. Synthia Innocent question.

## 2020-02-02 NOTE — Telephone Encounter (Addendum)
Pt returning call.  I relayed results and Dr. Synthia Innocent message.  Pt verbalizes understanding.  States yes, he was previously seen by Dr. [name] at [facility] but that he has retired.  Pt is ok with seeing another provider with [facility].  Also, concerning blood thinners, pt was told by cards to continue both, Plavix and Xarelto due to stent placed at widow maker artery junction.    Also, pt wants to discuss previous colonoscopy at upcoming [place].

## 2020-02-02 NOTE — Telephone Encounter (Signed)
Ok to go to [facility]. New referral placed.

## 2020-02-02 NOTE — Addendum Note (Signed)
Addended by: [name] on: 02/02/2020 10:48 AM   Modules accepted: Orders

## 2020-02-06 ENCOUNTER — Other Ambulatory Visit: Payer: Self-pay

## 2020-02-06 ENCOUNTER — Encounter: Payer: Self-pay | Admitting: Family Medicine

## 2020-02-06 ENCOUNTER — Ambulatory Visit (INDEPENDENT_AMBULATORY_CARE_PROVIDER_SITE_OTHER): Payer: Medicare Other | Admitting: Family Medicine

## 2020-02-06 VITALS — BP 130/66 | HR 73 | Temp 97.6°F | Ht 69.75 in | Wt 219.6 lb

## 2020-02-06 DIAGNOSIS — I480 Paroxysmal atrial fibrillation: Secondary | ICD-10-CM

## 2020-02-06 DIAGNOSIS — R208 Other disturbances of skin sensation: Secondary | ICD-10-CM

## 2020-02-06 DIAGNOSIS — R195 Other fecal abnormalities: Secondary | ICD-10-CM

## 2020-02-06 DIAGNOSIS — Z0001 Encounter for general adult medical examination with abnormal findings: Secondary | ICD-10-CM | POA: Diagnosis not present

## 2020-02-06 DIAGNOSIS — R5383 Other fatigue: Secondary | ICD-10-CM

## 2020-02-06 DIAGNOSIS — D500 Iron deficiency anemia secondary to blood loss (chronic): Secondary | ICD-10-CM | POA: Diagnosis not present

## 2020-02-06 DIAGNOSIS — G609 Hereditary and idiopathic neuropathy, unspecified: Secondary | ICD-10-CM | POA: Diagnosis not present

## 2020-02-06 DIAGNOSIS — I10 Essential (primary) hypertension: Secondary | ICD-10-CM

## 2020-02-06 DIAGNOSIS — Z86711 Personal history of pulmonary embolism: Secondary | ICD-10-CM

## 2020-02-06 DIAGNOSIS — Z7901 Long term (current) use of anticoagulants: Secondary | ICD-10-CM

## 2020-02-06 DIAGNOSIS — E669 Obesity, unspecified: Secondary | ICD-10-CM

## 2020-02-06 DIAGNOSIS — K219 Gastro-esophageal reflux disease without esophagitis: Secondary | ICD-10-CM

## 2020-02-06 DIAGNOSIS — E7800 Pure hypercholesterolemia, unspecified: Secondary | ICD-10-CM

## 2020-02-06 DIAGNOSIS — E611 Iron deficiency: Secondary | ICD-10-CM | POA: Insufficient documentation

## 2020-02-06 DIAGNOSIS — I700 Atherosclerosis of aorta: Secondary | ICD-10-CM

## 2020-02-06 DIAGNOSIS — E66811 Obesity, class 1: Secondary | ICD-10-CM

## 2020-02-06 LAB — CBC WITH DIFFERENTIAL/PLATELET
Basophils Absolute: 0 10*3/uL (ref 0.0–0.1)
Basophils Relative: 0.3 % (ref 0.0–3.0)
Eosinophils Absolute: 0 10*3/uL (ref 0.0–0.7)
Eosinophils Relative: 0.3 % (ref 0.0–5.0)
HCT: 36.4 % — ABNORMAL LOW (ref 39.0–52.0)
Hemoglobin: 11.4 g/dL — ABNORMAL LOW (ref 13.0–17.0)
Lymphocytes Relative: 27.9 % (ref 12.0–46.0)
Lymphs Abs: 2 10*3/uL (ref 0.7–4.0)
MCHC: 31.4 g/dL (ref 30.0–36.0)
MCV: 78.7 fl (ref 78.0–100.0)
Monocytes Absolute: 0.6 10*3/uL (ref 0.1–1.0)
Monocytes Relative: 7.9 % (ref 3.0–12.0)
Neutro Abs: 4.6 10*3/uL (ref 1.4–7.7)
Neutrophils Relative %: 63.6 % (ref 43.0–77.0)
Platelets: 176 10*3/uL (ref 150.0–400.0)
RBC: 4.63 Mil/uL (ref 4.22–5.81)
RDW: 18.1 % — ABNORMAL HIGH (ref 11.5–15.5)
WBC: 7.3 10*3/uL (ref 4.0–10.5)

## 2020-02-06 LAB — POC URINALSYSI DIPSTICK (AUTOMATED)
Bilirubin, UA: NEGATIVE
Blood, UA: NEGATIVE
Glucose, UA: NEGATIVE
Ketones, UA: NEGATIVE
Leukocytes, UA: NEGATIVE
Nitrite, UA: NEGATIVE
Protein, UA: NEGATIVE
Spec Grav, UA: 1.015 (ref 1.010–1.025)
Urobilinogen, UA: 0.2 E.U./dL
pH, UA: 6 (ref 5.0–8.0)

## 2020-02-06 LAB — IBC PANEL
Iron: 37 ug/dL — ABNORMAL LOW (ref 42–165)
Saturation Ratios: 7.1 % — ABNORMAL LOW (ref 20.0–50.0)
Transferrin: 371 mg/dL — ABNORMAL HIGH (ref 212.0–360.0)

## 2020-02-06 LAB — FERRITIN: Ferritin: 9.4 ng/mL — ABNORMAL LOW (ref 22.0–322.0)

## 2020-02-06 LAB — TSH: TSH: 1.7 u[IU]/mL (ref 0.35–4.50)

## 2020-02-06 NOTE — Assessment & Plan Note (Signed)
Chronic, stable on valsartan, metoprolol XL and lasix daily - continue.

## 2020-02-06 NOTE — Assessment & Plan Note (Signed)
Continue statin and plavix.

## 2020-02-06 NOTE — Assessment & Plan Note (Signed)
Preventative protocols reviewed and updated unless pt declined. Discussed healthy diet and lifestyle.  

## 2020-02-06 NOTE — Patient Instructions (Addendum)
We will request nerve conduction study from Dr Murray Hodgkins earlier this year. We will refer you to neurologist in [place]. Continue trazodone.  We will refer you to [facility]. Urinalysis today.  Labs today  I will touch base with cardiology (Camnitz) about your xarelto.  Good to see you today.   Health Maintenance After Age 83 After age 34, you are at a higher risk for certain long-term diseases and infections as well as injuries from falls. Falls are a major cause of broken bones and head injuries in people who are older than age 22. Getting regular preventive care can help to keep you healthy and well. Preventive care includes getting regular testing and making lifestyle changes as recommended by your health care provider. Talk with your health care provider about:  Which screenings and tests you should have. A screening is a test that checks for a disease when you have no symptoms.  A diet and exercise plan that is right for you. What should I know about screenings and tests to prevent falls? Screening and testing are the best ways to find a health problem early. Early diagnosis and treatment give you the best chance of managing medical conditions that are common after age 76. Certain conditions and lifestyle choices may make you more likely to have a fall. Your health care provider may recommend:  Regular vision checks. Poor vision and conditions such as cataracts can make you more likely to have a fall. If you wear glasses, make sure to get your prescription updated if your vision changes.  Medicine review. Work with your health care provider to regularly review all of the medicines you are taking, including over-the-counter medicines. Ask your health care provider about any side effects that may make you more likely to have a fall. Tell your health care provider if any medicines that you take make you feel dizzy or sleepy.  Osteoporosis screening. Osteoporosis is a condition that causes the  bones to get weaker. This can make the bones weak and cause them to break more easily.  Blood pressure screening. Blood pressure changes and medicines to control blood pressure can make you feel dizzy.  Strength and balance checks. Your health care provider may recommend certain tests to check your strength and balance while standing, walking, or changing positions.  Foot health exam. Foot pain and numbness, as well as not wearing proper footwear, can make you more likely to have a fall.  Depression screening. You may be more likely to have a fall if you have a fear of falling, feel emotionally low, or feel unable to do activities that you used to do.  Alcohol use screening. Using too much alcohol can affect your balance and may make you more likely to have a fall. What actions can I take to lower my risk of falls? General instructions  Talk with your health care provider about your risks for falling. Tell your health care provider if: ? You fall. Be sure to tell your health care provider about all falls, even ones that seem minor. ? You feel dizzy, sleepy, or off-balance.  Take over-the-counter and prescription medicines only as told by your health care provider. These include any supplements.  Eat a healthy diet and maintain a healthy weight. A healthy diet includes low-fat dairy products, low-fat (lean) meats, and fiber from whole grains, beans, and lots of fruits and vegetables. Home safety  Remove any tripping hazards, such as rugs, cords, and clutter.  Install safety equipment such  as grab bars in bathrooms and safety rails on stairs.  Keep rooms and walkways well-lit. Activity   Follow a regular exercise program to stay fit. This will help you maintain your balance. Ask your health care provider what types of exercise are appropriate for you.  If you need a cane or walker, use it as recommended by your health care provider.  Wear supportive shoes that have nonskid  soles. Lifestyle  Do not drink alcohol if your health care provider tells you not to drink.  If you drink alcohol, limit how much you have: ? 0-1 drink a day for women. ? 0-2 drinks a day for men.  Be aware of how much alcohol is in your drink. In the [place] one drink equals one typical bottle of beer (12 oz), one-half glass of wine (5 oz), or one shot of hard liquor (1 oz).  Do not use any products that contain nicotine or tobacco, such as cigarettes and e-cigarettes. If you need help quitting, ask your health care provider. Summary  Having a healthy lifestyle and getting preventive care can help to protect your health and wellness after age 67.  Screening and testing are the best way to find a health problem early and help you avoid having a fall. Early diagnosis and treatment give you the best chance for managing medical conditions that are more common for people who are older than age 49.  Falls are a major cause of broken bones and head injuries in people who are older than age 39. Take precautions to prevent a fall at home.  Work with your health care provider to learn what changes you can make to improve your health and wellness and to prevent falls. This information is not intended to replace advice given to you by your health care provider. Make sure you discuss any questions you have with your health care provider. Document Revised: 05/20/2018 Document Reviewed: 12/10/2016 Elsevier Patient Education  2020 [employer].

## 2020-02-06 NOTE — Telephone Encounter (Signed)
Spoke with patient at [facility].

## 2020-02-06 NOTE — Assessment & Plan Note (Signed)
Stable period on daily pantoprazole - continue.

## 2020-02-06 NOTE — Assessment & Plan Note (Signed)
Progressive anticipate related to newly noted presumed IDA, labs pending to determine need for iron infusion and/or oral iron.

## 2020-02-06 NOTE — Assessment & Plan Note (Addendum)
New. Check urinalysis today. Recent iFOB + Anticipate from slow GI blood loss.  Reviewed colonoscopy from 11/2018 - diverticulosis with polyps. ?diverticular bleed. Will refer back to GI for further eval.  Will touch base with cardiology re: blood thinner plan.

## 2020-02-06 NOTE — Assessment & Plan Note (Signed)
Sounds regular. Continues xarelto. Will touch base with EP regarding xarelto plan.

## 2020-02-06 NOTE — Assessment & Plan Note (Addendum)
This occurred when xarelto was held for knee replacement surgery.

## 2020-02-06 NOTE — Assessment & Plan Note (Signed)
Presumed due to IDA from ongoing GI blood loss. Will request GI input, pt requests [facility] referral given prior gastroenterologist retired.

## 2020-02-06 NOTE — Progress Notes (Signed)
Patient ID: Theodore [name], male    DOB: 1934-06-09, 85 y.o.   MRN: [id number]  This visit was conducted in person.  BP 130/66 (BP Location: Left Arm, Patient Position: Sitting, Cuff Size: Normal)   Pulse 73   Temp 97.6 F (36.4 C) (Temporal)   Ht 5' 9.75" (1.772 m)   Wt 219 lb 9 oz (99.6 kg)   SpO2 96%   BMI 31.73 kg/m    CC: CPE Subjective:   HPI: Theodore [name] is a 85 y.o. male presenting on 02/06/2020 for Annual Exam (Prt 2.  Wants to discuss 2020 colonoscopy. )    Saw health advisor 07/2019 for medicare wellness visit. Note reviewed.    No exam data present  Flowsheet Row Clinical Support from 08/09/2019 in [place] at [facility]  PHQ-2 Total Score 0      Fall Risk  08/09/2019 02/14/2019 08/06/2018 07/23/2017 01/13/2017  Falls in the past year? 0 0 0 No No  Number falls in past yr: 0 - - - -  Injury with Fall? 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 Heart catheterization late 2020 s/p coronary atherectomy and coronary stents (DES x2 to LAD) complicated by afib recurrence started on xarelto. PFTs 10/2018 - moderate airway obstruction is present suggesting small airway disease - sees pulm now on breztri with good effect.   Predominant concern has been bilateral burning foot pain and redness to soles that develops 2 hours after falling asleep - wakes him up from sleep. Symptoms only present when supine. Poor tolerance to TCA (nortriptyline caused arrhythmia) and effexor (caused shaking/tremors). Continues gabapentin 600mg  nightly - lyrica less effective. MRI showed degenerative lumbar stenosis - referred to Dr Theodore [name], no surgical intervention planned - sent to Dr Theodore [name] for NCS revealing periph neuropathy, received 2 steroid injections into spine without any benefit. Now notices progressive neuropathy (ie  burning discomfort when wearing shoes, not just when supine). Does better when he sleeps in recliner. No increased rigidity/stiffness, no gait instability, no significant memory trouble. Latest we added trazodone for sleep - with good effect.   More recently having trouble with nosebleeds has seen ENT, as well as newly noted anemia with positive hemoccult. Last colonoscopy (11/2018) reviewed. He hasn't noticed blood in stool or urine. Not currently on oral iron, but previously has tolerated well.   He continues plavix and xarelto. Has been told needs to continue plavix indefinitely due to location of stents (LAD x2). Also was recommended lifelong xarelto due to h/o afib and PE. He sees Dr Theodore [name] and Dr Theodore [name] cardiology. Last ablation ~3 yrs ago, hasn't felt arrhythmia since.   Preventative: COLONOSCOPY WITH PROPOFOL 12/02/2018 - TAx2, HP, diverticulosis Theodore [name], Theodore Rinks, MD)  Also had EGD 11/2018 - medium [facility] Theodore [name]) Prostate cancer screening -always normal saw urology. Age out Lung cancer screening -non smoker Flu shotyearly COVID vaccine Pfizer 02/2019, 03/2019, 10/2019 Tetanus shotunsure  Pneumovax 2011, prevnar 2016 zostavax-~2014 shingrix - 07/2019, 09/2019 Advanced directive-Advanced directives: scanned and in chart 07/2016. Wants wife Theodore [name] to be HCPOA then children. Grants HCPOA discretion for medical decisions  Seat belt use discussed  Sunscreen usediscussed. No changing moles on skin.Seesderm - currently undergoing topical 5FU. Non smoker  Alcohol - 1-2 beers/day- fully quit for 1 month without benefit  in neuropathy.  Dentist q6 mo  Eye exam yearly Bowel - no constipation  Bladder - no incontinence   Lives with wife, no pets Retired Probation officer: Theodore [name] Edu: master's Activity: golfand walking- limited due to dyspnea Diet: good water, fruits/vegetables daily     Relevant past medical, surgical, family and social history reviewed and updated as  indicated. Interim medical history since our last visit reviewed. Allergies and medications reviewed and updated. Outpatient Medications Prior to Visit  Medication Sig Dispense Refill  . acetaminophen (TYLENOL) 500 MG tablet Take 1 tablet (500 mg total) by mouth 3 (three) times daily as needed. 30 tablet 0  . amoxicillin (AMOXIL) 500 MG tablet amoxicillin 500 mg tablet  TAKE 4 TABLETS BY MOUTH 1 HOUR PRIOR TO DENTAL APPOINTMENT    . Ascorbic Acid (VITAMIN C PO) Take 500 mg by mouth daily.    [name] atorvastatin (LIPITOR) 40 MG tablet TAKE 1 TABLET BY MOUTH  DAILY 90 tablet 3  . Budeson-Glycopyrrol-Formoterol (BREZTRI AEROSPHERE) 160-9-4.8 MCG/ACT AERO Inhale 2 puffs into the lungs 2 (two) times daily. 32.1 g 3  . Cholecalciferol (VITAMIN D3) 2000 units TABS Take 2,000 Units by mouth daily.    . clopidogrel (PLAVIX) 75 MG tablet Take 1 tablet (75 mg total) by mouth daily. 90 tablet 3  . ezetimibe (ZETIA) 10 MG tablet Take 1 tablet (10 mg total) by mouth daily. 90 tablet 3  . furosemide (LASIX) 20 MG tablet TAKE 1 TABLET BY MOUTH  DAILY 90 tablet 3  . gabapentin (NEURONTIN) 300 MG capsule Take 2 capsules (600 mg total) by mouth at bedtime. 180 capsule 1  . metoprolol succinate (TOPROL-XL) 50 MG 24 hr tablet TAKE 3 TABLETS (150 MG  TOTAL) DAILY.TAKE WITH OR  IMMEDIATELY FOLLOWING A  MEAL 270 tablet 3  . nitroGLYCERIN (NITROSTAT) 0.4 MG SL tablet Place 1 tablet (0.4 mg total) under the tongue every 5 (five) minutes as needed. 25 tablet 2  . pantoprazole (PROTONIX) 40 MG tablet TAKE 1 TABLET BY MOUTH  DAILY 90 tablet 3  . potassium chloride (KLOR-CON) 10 MEQ tablet TAKE 1 TABLET BY MOUTH  TWICE DAILY 180 tablet 3  . rivaroxaban (XARELTO) 20 MG TABS tablet Take 1 tablet (20 mg total) by mouth daily with supper. 90 tablet 3  . Spacer/Aero-Holding Chambers (AEROCHAMBER MV) inhaler Use as instructed 1 each 0  . traZODone (DESYREL) 50 MG tablet Take 0.5-1 tablets (25-50 mg total) by mouth at bedtime as needed  for sleep. 30 tablet 3  . valsartan (DIOVAN) 320 MG tablet Take 1 tablet (320 mg total) by mouth daily. 90 tablet 3  . vitamin B-12 (CYANOCOBALAMIN) 1000 MCG tablet Take 1 tablet (1,000 mcg total) by mouth daily.     No facility-administered medications prior to visit.     Per HPI unless specifically indicated in ROS section below Review of Systems  Constitutional: Negative for activity change, appetite change, chills, fatigue, fever and unexpected weight change.  HENT: Negative for hearing loss.   Eyes: Negative for visual disturbance.  Respiratory: Positive for wheezing (mild). Negative for cough, chest tightness and shortness of breath.   Cardiovascular: Negative for chest pain, palpitations and leg swelling.  Gastrointestinal: Negative for abdominal distention, abdominal pain, blood in stool, constipation, diarrhea, nausea and vomiting.  Genitourinary: Negative for difficulty urinating and hematuria.  Musculoskeletal: Negative for arthralgias, myalgias and neck pain.  Skin: Negative for rash.  Neurological: Negative for dizziness, seizures, syncope and headaches.  Hematological: Negative for adenopathy. Bruises/bleeds easily.  Psychiatric/Behavioral: Negative for dysphoric mood. The patient is not nervous/anxious.    Objective:  BP 130/66 (BP Location: Left Arm, Patient Position: Sitting, Cuff Size: Normal)   Pulse 73   Temp 97.6 F (36.4 C) (Temporal)   Ht 5' 9.75" (1.772 m)   Wt 219 lb 9 oz (99.6 kg)   SpO2 96%   BMI 31.73 kg/m   Wt Readings from Last 3 Encounters:  02/06/20 219 lb 9 oz (99.6 kg)  12/14/19 219 lb 9.6 oz (99.6 kg)  10/25/19 216 lb (98 kg)      Physical Exam Vitals and nursing note reviewed.  Constitutional:      General: He is not in acute distress.    Appearance: Normal appearance. He is well-developed and well-nourished. He is not ill-appearing.  HENT:     Head: Normocephalic and atraumatic.     Right Ear: Hearing, tympanic membrane, ear canal and  external ear normal.     Left Ear: Hearing, tympanic membrane, ear canal and external ear normal.     Mouth/Throat:     Mouth: Oropharynx is clear and moist and mucous membranes are normal.     Pharynx: No posterior oropharyngeal edema.  Eyes:     General: No scleral icterus.    Extraocular Movements: EOM normal.     Conjunctiva/sclera: Conjunctivae normal.     Pupils: Pupils are equal, round, and reactive to light.  Neck:     Thyroid: No thyroid mass or thyromegaly.  Cardiovascular:     Rate and Rhythm: Normal rate and regular rhythm.     Pulses: Normal pulses and intact distal pulses.          Radial pulses are 2+ on the right side and 2+ on the left side.     Heart sounds: Normal heart sounds. No murmur heard.   Pulmonary:     Effort: Pulmonary effort is normal. No respiratory distress.     Breath sounds: Normal breath sounds. No wheezing, rhonchi or rales.  Abdominal:     General: Abdomen is flat. Bowel sounds are normal. There is no distension.     Palpations: Abdomen is soft. There is no mass.     Tenderness: There is no abdominal tenderness. There is no guarding or rebound.     Hernia: No hernia is present.  Musculoskeletal:        General: No edema. Normal range of motion.     Cervical back: Normal range of motion and neck supple.     Right lower leg: No edema.     Left lower leg: No edema.  Lymphadenopathy:     Cervical: No cervical adenopathy.  Skin:    General: Skin is warm and dry.     Findings: No rash.  Neurological:     General: No focal deficit present.     Mental Status: He is alert and oriented to person, place, and time.     Comments: CN grossly intact, station and gait intact  Psychiatric:        Mood and Affect: Mood and affect and mood normal.        Behavior: Behavior normal.        Thought Content: Thought content normal.        Judgment: Judgment normal.       Results for orders placed or performed in visit on 02/06/20  IBC panel  Result  Value Ref Range   Iron 37 (L) 42 - 165 ug/dL   Transferrin 371.0 (  H) 212.0 - 360.0 mg/dL   Saturation Ratios 7.1 (L) 20.0 - 50.0 %  Ferritin  Result Value Ref Range   Ferritin 9.4 (L) 22.0 - 322.0 ng/mL  CBC with Differential/Platelet  Result Value Ref Range   WBC 7.3 4.0 - 10.5 K/uL   RBC 4.63 4.22 - 5.81 Mil/uL   Hemoglobin 11.4 (L) 13.0 - 17.0 g/dL   HCT 36.4 (L) 39.0 - 52.0 %   MCV 78.7 78.0 - 100.0 fl   MCHC 31.4 30.0 - 36.0 g/dL   RDW 18.1 (H) 11.5 - 15.5 %   Platelets 176.0 150.0 - 400.0 K/uL   Neutrophils Relative % 63.6 43.0 - 77.0 %   Lymphocytes Relative 27.9 12.0 - 46.0 %   Monocytes Relative 7.9 3.0 - 12.0 %   Eosinophils Relative 0.3 0.0 - 5.0 %   Basophils Relative 0.3 0.0 - 3.0 %   Neutro Abs 4.6 1.4 - 7.7 K/uL   Lymphs Abs 2.0 0.7 - 4.0 K/uL   Monocytes Absolute 0.6 0.1 - 1.0 K/uL   Eosinophils Absolute 0.0 0.0 - 0.7 K/uL   Basophils Absolute 0.0 0.0 - 0.1 K/uL  TSH  Result Value Ref Range   TSH 1.70 0.35 - 4.50 uIU/mL  POCT Urinalysis Dipstick (Automated)  Result Value Ref Range   Color, UA light yellow    Clarity, UA clear    Glucose, UA Negative Negative   Bilirubin, UA negative    Ketones, UA negative    Spec Grav, UA 1.015 1.010 - 1.025   Blood, UA negative    pH, UA 6.0 5.0 - 8.0   Protein, UA Negative Negative   Urobilinogen, UA 0.2 0.2 or 1.0 E.U./dL   Nitrite, UA negative    Leukocytes, UA Negative Negative   Assessment & Plan:  This visit occurred during the SARS-CoV-2 public health emergency.  Safety protocols were in place, including screening questions prior to the visit, additional usage of staff PPE, and extensive cleaning of exam room while observing appropriate contact time as indicated for disinfecting solutions.   Problem List Items Addressed This Visit    Thoracic aorta atherosclerosis ([place])    Continue statin and plavix.       Peripheral neuropathy    Ongoing main concern affecting QOL. Symptoms predominantly when supine at  night, but progressing to symptoms when wearing shoes during the day.  Intolerant to TCA or effexor.  Managing with gabapentin 600mg  at night.  S/p neurosurgery eval, did not respond to steroid injections into the spine.  Will request latest NCS completed by [place] [facility] (Dr Theodore [name]) and refer to neurology for further evaluation.       Relevant Orders   Ambulatory referral to Neurology   TSH (Completed)   Paroxysmal atrial fibrillation ([place]) (Chronic)    Sounds regular. Continues xarelto. Will touch base with EP regarding xarelto plan.       Occult blood positive stool    Presumed due to IDA from ongoing GI blood loss. Will request GI input, pt requests [facility] referral given prior gastroenterologist retired.       Obesity, Class I, BMI 30.0-34.9 (see actual BMI)   Iron deficiency anemia due to chronic blood loss    New. Check urinalysis today. Recent iFOB + Anticipate from slow GI blood loss.  Reviewed colonoscopy from 11/2018 - diverticulosis with polyps. ?diverticular bleed. Will refer back to GI for further eval.  Will touch base with cardiology re: blood thinner plan.  Relevant Orders   IBC panel (Completed)   Ferritin (Completed)   CBC with Differential/Platelet (Completed)   POCT Urinalysis Dipstick (Automated) (Completed)   HLD (hyperlipidemia)    Chronic, good control on atorvastatin and zetia - continue.  The ASCVD Risk score Mikey Bussing DC Jr., et al., 2013) failed to calculate for the following reasons:   The 2013 ASCVD risk score is only valid for ages 18 to 60       History of pulmonary embolism    This occurred when xarelto was held for knee replacement surgery.       GERD (gastroesophageal reflux disease)    Stable period on daily pantoprazole - continue.       Fatigue    Progressive anticipate related to newly noted presumed IDA, labs pending to determine need for iron infusion and/or oral iron.       Essential hypertension (Chronic)     Chronic, stable on valsartan, metoprolol XL and lasix daily - continue.      Encounter for general adult medical examination with abnormal findings - Primary    Preventative protocols reviewed and updated unless pt declined. Discussed healthy diet and lifestyle.       Chronic anticoagulation (Chronic)    High chadsvasc score. Continues xarelto + plavix.  Pt unhappy with degree of easy bruising/bleeding noted on this regimen.       Burning sensation of foot       No orders of the defined types were placed in this encounter.  Orders Placed This Encounter  Procedures  . IBC panel  . Ferritin  . CBC with Differential/Platelet  . TSH  . Ambulatory referral to Neurology    Referral Priority:   Routine    Referral Type:   Consultation    Referral Reason:   Specialty Services Required    Requested Specialty:   Neurology    Number of Visits Requested:   1  . POCT Urinalysis Dipstick (Automated)    Patient instructions: We will request nerve conduction study from Dr Theodore [name] earlier this year. We will refer you to neurologist in [place]. Continue trazodone.  We will refer you to [facility]. Urinalysis today.  Labs today  I will touch base with cardiology (Camnitz) about your xarelto.  Good to see you today.   Follow up plan: Return in about 6 months (around 08/06/2020), or if symptoms worsen or fail to improve, for follow up visit.  [name], MD

## 2020-02-06 NOTE — Assessment & Plan Note (Addendum)
High chadsvasc score. Continues xarelto + plavix.  Pt unhappy with degree of easy bruising/bleeding noted on this regimen.

## 2020-02-06 NOTE — Assessment & Plan Note (Signed)
Ongoing main concern affecting QOL. Symptoms predominantly when supine at night, but progressing to symptoms when wearing shoes during the day.  Intolerant to TCA or effexor.  Managing with gabapentin 600mg  at night.  S/p neurosurgery eval, did not respond to steroid injections into the spine.  Will request latest NCS completed by [facility] (Dr Brien Few) and refer to neurology for further evaluation.

## 2020-02-06 NOTE — Assessment & Plan Note (Signed)
Chronic, good control on atorvastatin and zetia - continue.  The ASCVD Risk score Denman George DC Jr., et al., 2013) failed to calculate for the following reasons:   The 2013 ASCVD risk score is only valid for ages 2 to 57

## 2020-02-09 ENCOUNTER — Telehealth: Payer: Self-pay | Admitting: Family Medicine

## 2020-02-09 NOTE — Telephone Encounter (Signed)
Touched base with Dr Elberta Fortis - ok to hold xarelto at this time.  If recurrent atrial fibrillation we should restart xarelto.

## 2020-02-21 ENCOUNTER — Telehealth: Payer: Self-pay | Admitting: Family Medicine

## 2020-02-21 NOTE — Telephone Encounter (Signed)
Patient called in checking on referral status for neurology and gastroenterologist. Stated it has been over 2 weeks and has not heard anything. Please advise.

## 2020-02-22 ENCOUNTER — Other Ambulatory Visit: Payer: Self-pay | Admitting: Family Medicine

## 2020-02-22 NOTE — Telephone Encounter (Signed)
Ok to send 90-day rx?

## 2020-02-23 ENCOUNTER — Encounter: Payer: Self-pay | Admitting: Family Medicine

## 2020-02-23 NOTE — Telephone Encounter (Signed)
I have reached out to both offices regarding the referrals placed.  Both office have a referral review process - It does not look like either referral has started the review process at this time - they must be behind. I will follow up on this if I do not hear back from either office tomorrow/Monday 1/17 at the latest. Patient may also reach to inquire.    GI - Phone: [phone] [facility] (Dr Manuella Ghazi) - Phone: [phone]

## 2020-02-23 NOTE — Telephone Encounter (Signed)
Pt called in wanted to know about getting a appointment for his referrals he was told that we would set up the appointments and wanted to know about the iron infusion

## 2020-02-23 NOTE — Telephone Encounter (Signed)
Patient is asking about the Iron Infusion discussed -- please advise.

## 2020-02-23 NOTE — Telephone Encounter (Signed)
Pt is interested in iron infusion.  Placed order in Dr. Synthia Innocent box to sign.

## 2020-02-23 NOTE — Telephone Encounter (Signed)
Theodore [name] is working on GI referral and I'm working on iron infusion.  Pt is aware. (see TE, 02/21/20)

## 2020-02-24 NOTE — Telephone Encounter (Signed)
Called for pre-cert.  Per ins co, none needed.  Ref # (last 4 digits): [id number].    Faxed order to [facility].  Lvm asking them to call back. Need to schedule pt for iron infusion.

## 2020-02-24 NOTE — Telephone Encounter (Signed)
Form filled and in [name]'s box.  

## 2020-02-28 ENCOUNTER — Other Ambulatory Visit (HOSPITAL_COMMUNITY): Payer: Self-pay | Admitting: *Deleted

## 2020-02-28 NOTE — Telephone Encounter (Signed)
Spoke with Theodore [name] scheduling infusion on Mon, 03/05/20 at 9:00.  Spoke with pt relaying appt info, including instructions to go to [facility] main entrance (entrance 'A', pull up to [employer]).  Also, provided phn #:  [phone], in case pt needs to r/s or c/x appt.  Pt verbalizes understanding and expresses his thanks.

## 2020-03-05 ENCOUNTER — Other Ambulatory Visit: Payer: Self-pay

## 2020-03-05 ENCOUNTER — Encounter (HOSPITAL_COMMUNITY)
Admission: RE | Admit: 2020-03-05 | Discharge: 2020-03-05 | Disposition: A | Discharge status: Home / Self Care | Payer: Medicare Other | Source: Ambulatory Visit | Attending: Family Medicine | Admitting: Family Medicine

## 2020-03-05 DIAGNOSIS — D500 Iron deficiency anemia secondary to blood loss (chronic): Secondary | ICD-10-CM | POA: Diagnosis not present

## 2020-03-05 MED ORDER — SODIUM CHLORIDE 0.9 % IV SOLN
510.0000 mg | INTRAVENOUS | Status: DC
  Administered 2020-03-05: 510 mg via INTRAVENOUS
  Filled 2020-03-05: qty 17

## 2020-03-05 NOTE — Discharge Instructions (Signed)
Ferumoxytol injection What is this medicine? FERUMOXYTOL is an iron complex. Iron is used to make healthy red blood cells, which carry oxygen and nutrients throughout the body. This medicine is used to treat iron deficiency anemia. This medicine may be used for other purposes; ask your health care provider or pharmacist if you have questions. COMMON BRAND NAME(S): Feraheme What should I tell my health care provider before I take this medicine? They need to know if you have any of these conditions:  anemia not caused by low iron levels  high levels of iron in the blood  magnetic resonance imaging (MRI) test scheduled  an unusual or allergic reaction to iron, other medicines, foods, dyes, or preservatives  pregnant or trying to get pregnant  breast-feeding How should I use this medicine? This medicine is for injection into a vein. It is given by a health care professional in a hospital or clinic setting. Talk to your pediatrician regarding the use of this medicine in children. Special care may be needed. Overdosage: If you think you have taken too much of this medicine contact a poison control center or emergency room at once. NOTE: This medicine is only for you. Do not share this medicine with others. What if I miss a dose? It is important not to miss your dose. Call your doctor or health care professional if you are unable to keep an appointment. What may interact with this medicine? This medicine may interact with the following medications:  other iron products This list may not describe all possible interactions. Give your health care provider a list of all the medicines, herbs, non-prescription drugs, or dietary supplements you use. Also tell them if you smoke, drink alcohol, or use illegal drugs. Some items may interact with your medicine. What should I watch for while using this medicine? Visit your doctor or healthcare professional regularly. Tell your doctor or healthcare  professional if your symptoms do not start to get better or if they get worse. You may need blood work done while you are taking this medicine. You may need to follow a special diet. Talk to your doctor. Foods that contain iron include: whole grains/cereals, dried fruits, beans, or peas, leafy green vegetables, and organ meats (liver, kidney). What side effects may I notice from receiving this medicine? Side effects that you should report to your doctor or health care professional as soon as possible:  allergic reactions like skin rash, itching or hives, swelling of the face, lips, or tongue  breathing problems  changes in blood pressure  feeling faint or lightheaded, falls  fever or chills  flushing, sweating, or hot feelings  swelling of the ankles or feet Side effects that usually do not require medical attention (report to your doctor or health care professional if they continue or are bothersome):  diarrhea  headache  nausea, vomiting  stomach pain This list may not describe all possible side effects. Call your doctor for medical advice about side effects. You may report side effects to FDA at 1-800-FDA-1088. Where should I keep my medicine? This drug is given in a hospital or clinic and will not be stored at home. NOTE: This sheet is a summary. It may not cover all possible information. If you have questions about this medicine, talk to your doctor, pharmacist, or health care provider.  2021 Elsevier/Gold Standard (2016-03-17 20:21:10)  

## 2020-03-12 ENCOUNTER — Encounter (HOSPITAL_COMMUNITY)
Admission: RE | Admit: 2020-03-12 | Discharge: 2020-03-12 | Disposition: A | Discharge status: Home / Self Care | Payer: Medicare Other | Source: Ambulatory Visit | Attending: Family Medicine | Admitting: Family Medicine

## 2020-03-12 ENCOUNTER — Other Ambulatory Visit: Payer: Self-pay

## 2020-03-12 DIAGNOSIS — D500 Iron deficiency anemia secondary to blood loss (chronic): Secondary | ICD-10-CM | POA: Diagnosis not present

## 2020-03-12 MED ORDER — SODIUM CHLORIDE 0.9 % IV SOLN
510.0000 mg | INTRAVENOUS | Status: DC
  Administered 2020-03-12: 510 mg via INTRAVENOUS
  Filled 2020-03-12: qty 17

## 2020-03-14 ENCOUNTER — Ambulatory Visit (INDEPENDENT_AMBULATORY_CARE_PROVIDER_SITE_OTHER): Payer: Medicare Other | Admitting: Gastroenterology

## 2020-03-14 ENCOUNTER — Encounter: Payer: Self-pay | Admitting: Gastroenterology

## 2020-03-14 ENCOUNTER — Other Ambulatory Visit: Payer: Self-pay

## 2020-03-14 VITALS — BP 130/80 | HR 77 | Ht 71.0 in | Wt 219.0 lb

## 2020-03-14 DIAGNOSIS — K449 Diaphragmatic hernia without obstruction or gangrene: Secondary | ICD-10-CM | POA: Diagnosis not present

## 2020-03-14 DIAGNOSIS — Z7902 Long term (current) use of antithrombotics/antiplatelets: Secondary | ICD-10-CM

## 2020-03-14 DIAGNOSIS — R195 Other fecal abnormalities: Secondary | ICD-10-CM

## 2020-03-14 DIAGNOSIS — D500 Iron deficiency anemia secondary to blood loss (chronic): Secondary | ICD-10-CM | POA: Diagnosis not present

## 2020-03-14 NOTE — Patient Instructions (Signed)
If you are age 84 or older, your body mass index should be between 23-30. Your Body mass index is 30.54 kg/m. If this is out of the aforementioned range listed, please consider follow up with your Primary Care Provider.  If you are age 78 or younger, your body mass index should be between 19-25. Your Body mass index is 30.54 kg/m. If this is out of the aformentioned range listed, please consider follow up with your Primary Care Provider.   We will send a copy of today's visit to your primary care physician.  It was a pleasure to see you today!  Dr. Loletha Carrow

## 2020-03-14 NOTE — Progress Notes (Signed)
[facility] Consult Note:  History: Theodore [name] 03/14/2020  Referring provider: [name], MD  Reason for consult/chief complaint: heme positive (Pt reports positive hemoccult cards;  had colonoscopy last year for same thing; is now taking 2 blood thinners; low hgb - has had two iron infusions)   Subjective  HPI:  Theodore [name] was referred by Dr. Danise Mina for iron deficiency anemia and heme positive stool.  This was discovered on some lab work several months ago, and Theodore [name] had been heme positive but without anemia in late 2020. Upper endoscopy and colonoscopy reports by Dr. Leonie Douglas of [facility] on 12/02/2018.  These were done for heme positive stool, reports were reviewed.  Upper endoscopy revealed a hiatal hernia, colonoscopy revealed diverticulosis and small adenomatous and hyperplastic polyps.  FOBT positive on 11/11/2018 and 02/01/2020  Oral iron for a few weeks cause abdominal pain and constipation, so he then received 2 Feraheme injections on 03/05/2020 and 03/12/2020  From his most recent cardiology office note by Dr. [name] on 10/25/2019: "[name] is a 85 y.o. male who presents today for electrophysiology evaluation.   He has a history significant for hypertension, hyperlipidemia, persistent atrial fibrillation, and rheumatic fever.  He was diagnosed with atrial fibrillation in 2014.  He is initially on Pradaxa but switched to Xarelto.  He had an episode of chest pain in 2016 with a Myoview showing inferior scar and peri-infarct ischemia.  He had a left heart catheterization that showed a 70%.  He had an atrial fibrillation ablation 12/19/2015.  Recent heart catheterization showed an 80% LAD lesion and he is now status post stenting "  Theodore [name] says he has fatigue and dyspnea and understands that he has COPD and a heart condition, "but I am in good shape for 85".  He had hoped his symptoms would improve after the coronary stent placement but was  disappointed that they did not.  He denies abdominal pain, dysphagia, odynophagia, nausea, vomiting, early satiety, altered bowel habits, rectal bleeding or weight loss.  He does not recall ever seeing overt GI bleeding in the way of black tarry stool, maroon or bright red blood.  Lastly, he has been taking a once daily PPI for at least a decade due to heartburn and "dyspepsia".  ROS:  Review of Systems  Constitutional: Positive for fatigue. Negative for appetite change and unexpected weight change.  HENT: Negative for mouth sores and voice change.   Eyes: Negative for pain and redness.  Respiratory: Positive for shortness of breath. Negative for cough.   Cardiovascular: Negative for chest pain and palpitations.  Genitourinary: Negative for dysuria and hematuria.  Musculoskeletal: Negative for arthralgias and myalgias.  Skin: Negative for pallor and rash.  Neurological: Negative for weakness and headaches.  Hematological: Negative for adenopathy.     Past Medical History: Past Medical History:  Diagnosis Date  . CAP (community acquired pneumonia) 01/19/2018  . History of arthritis   . History of rheumatic fever   . Hypercholesterolemia   . Hypertension   . Nocturia   . PAF (paroxysmal atrial fibrillation) [facility] January 2013   Placed on Pradaxa. Did not require cardioversion; spontaneously converted  . Peripheral edema   . Right bundle branch block   . SOB (shortness of breath)   . Tendonitis of elbow, left      Past Surgical History: Past Surgical History:  Procedure Laterality Date  . ATRIAL FIBRILLATION ABLATION  12/19/2015  . BACK SURGERY  12/24/09   fusion  C3-C4  . BACK SURGERY  2010   fusion L4-L5 [facility])  . CARDIAC CATHETERIZATION  2009   NONOBSTRUCTIVE ATHERSCLEROTIC CORONARY DISEASE AND NORMAL  LV FUNCTION  . CARDIAC CATHETERIZATION N/A 09/08/2014   Procedure: Left Heart Cath and Coronary Angiography;  Surgeon: Belva Crome, MD;  Location: [place] [facility];   Service: Cardiovascular;  Laterality: N/A;  . CARDIAC CATHETERIZATION N/A 09/28/2015   Procedure: Left Heart Cath and Coronary Angiography;  Surgeon: Leonie Man, MD;  Location: [place] [facility];  Service: Cardiovascular;  Laterality: N/A;  . CARDIAC CATHETERIZATION N/A 09/28/2015   Procedure: Intravascular Pressure Wire/FFR Study;  Surgeon: Leonie Man, MD;  Location: [place] [facility];  Service: Cardiovascular;  Laterality: N/A;  . CARDIOVERSION N/A 10/23/2015   Procedure: CARDIOVERSION;  Surgeon: [name], MD;  Location: [facility] ENDOSCOPY;  Service: Cardiovascular;  Laterality: N/A;  . COLONOSCOPY  2013   per patient, rpt 5 yrs  . COLONOSCOPY WITH PROPOFOL N/A 12/02/2018   TAx2, HP, diverticulosis [name], MD)  . CORONARY ATHERECTOMY N/A 01/11/2019   Procedure: CORONARY ATHERECTOMY;  Surgeon: Burnell [name], MD;  Location: [place] [facility];  Service: Cardiovascular;  Laterality: N/A;  . CORONARY STENT INTERVENTION N/A 01/11/2019   Procedure: CORONARY STENT INTERVENTION;  Surgeon: Burnell [name], MD;  Location: [place] [facility];  Service: Cardiovascular;  Laterality: N/A;  . ELECTROPHYSIOLOGIC STUDY N/A 12/19/2015   Procedure: Atrial Fibrillation Ablation;  Surgeon: [name], MD;  Location: [place] [facility];  Service: Cardiovascular;  Laterality: N/A;  . ESOPHAGOGASTRODUODENOSCOPY (EGD) WITH PROPOFOL N/A 12/02/2018   medium HH [name], MD)  . INTRAVASCULAR PRESSURE WIRE/FFR STUDY N/A 01/10/2019   Procedure: INTRAVASCULAR PRESSURE WIRE/FFR STUDY;  Surgeon: Leonie Man, MD;  Location: [place] [facility];  Service: Cardiovascular;  Laterality: N/A;  . KNEE ARTHROSCOPY Left 03/2016   Dr. [name]  . LEFT HEART CATH AND CORONARY ANGIOGRAPHY N/A 01/10/2019   Procedure: LEFT HEART CATH AND CORONARY ANGIOGRAPHY;  Surgeon: Leonie Man, MD;  Location: [place] [facility];  Service: Cardiovascular;  Laterality: N/A;  . PATELLAR TENDON  REPAIR Left 2008  . POLYPECTOMY  12/02/2018   Procedure: POLYPECTOMY;  Surgeon: [name], MD;  Location: [facility] ENDOSCOPY;  Service: Endoscopy;;  . REPLACEMENT TOTAL KNEE Right 2006  . TOTAL KNEE ARTHROPLASTY Left 02/09/2017   Procedure: LEFT TOTAL KNEE ARTHROPLASTY, EXCISION LEFT DISTAL THIGH MASS;  Surgeon: [name], MD;  Location: [facility] ORS;  Service: Orthopedics;  Laterality: Left;  90 mins  . TRICEPS TENDON REPAIR Left 2013     Family History: Family History  Problem Relation Age of Onset  . Heart attack Father   . COPD Father   . Hypertension Father   . Heart disease Brother   . COPD Brother   . Asthma Sister   . Stroke Neg Hx     Social History: Social History   Socioeconomic History  . Marital status: Married    Spouse name: Not on file  . Number of children: Not on file  . Years of education: Not on file  . Highest education level: Not on file  Occupational History  . Occupation: retired    Fish farm manager: RETIRED    Comment: Art gallery manager  Tobacco Use  . Smoking status: Never Smoker  . Smokeless tobacco: Never Used  Vaping Use  . Vaping Use: Never used  Substance and Sexual Activity  . Alcohol use: Yes    Alcohol/week:  14.0 standard drinks    Types: 14 Cans of beer per week    Comment: 2 beers daily  . Drug use: No  . Sexual activity: Not Currently  Other Topics Concern  . Not on file  Social History Narrative   Lives with wife, no pets   Retired   Occ: Art gallery manager   Edu: master's   Activity: golf   Diet: good water, fruits/vegetables daily   Social Determinants of Health   Financial Resource Strain: Low Risk   . Difficulty of Paying Living Expenses: Not hard at all  Food Insecurity: No Food Insecurity  . Worried About Charity fundraiser in the Last Year: Never true  . Ran Out of Food in the Last Year: Never true  Transportation Needs: No Transportation Needs  . Lack of Transportation (Medical): No  . Lack of Transportation  (Non-Medical): No  Physical Activity: Inactive  . Days of Exercise per Week: 0 days  . Minutes of Exercise per Session: 0 min  Stress: No Stress Concern Present  . Feeling of Stress : Not at all  Social Connections: Not on file    Allergies: Allergies  Allergen Reactions  . Penicillins Rash and Other (See Comments)    "Blistering rash" Has patient had a PCN reaction causing immediate rash, facial/tongue/throat swelling, SOB or lightheadedness with hypotension: Yes Has patient had a PCN reaction causing severe rash involving mucus membranes or skin necrosis: No Has patient had a PCN reaction that required hospitalization: No Has patient had a PCN reaction occurring within the last 10 years: No If all of the above answers are "NO", then may proceed with Cephalosporin use.   . Amlodipine Swelling and Other (See Comments)    Peripheral edema  . Effexor [Venlafaxine] Other (See Comments)    Tremors/shaking  . Lisinopril Cough  . Mevacor [Lovastatin] Other (See Comments)    Caused cataracts and elevated liver enzymes  . Plavix [Clopidogrel]     Muscle aches   . Vicodin [Hydrocodone-Acetaminophen] Nausea And Vomiting  . Zocor [Simvastatin] Other (See Comments)    LFT increase with simvastatin and lovastatin   . Oseltamivir Phosphate Itching and Rash    Outpatient Meds: Current Outpatient Medications  Medication Sig Dispense Refill  . acetaminophen (TYLENOL) 500 MG tablet Take 1 tablet (500 mg total) by mouth 3 (three) times daily as needed. 30 tablet 0  . amoxicillin (AMOXIL) 500 MG tablet amoxicillin 500 mg tablet  TAKE 4 TABLETS BY MOUTH 1 HOUR PRIOR TO DENTAL APPOINTMENT    . Ascorbic Acid (VITAMIN C PO) Take 500 mg by mouth daily.    [name] atorvastatin (LIPITOR) 40 MG tablet TAKE 1 TABLET BY MOUTH  DAILY 90 tablet 3  . Budeson-Glycopyrrol-Formoterol (BREZTRI AEROSPHERE) 160-9-4.8 MCG/ACT AERO Inhale 2 puffs into the lungs 2 (two) times daily. 32.1 g 3  . Cholecalciferol  (VITAMIN D3) 2000 units TABS Take 2,000 Units by mouth daily.    . clopidogrel (PLAVIX) 75 MG tablet Take 1 tablet (75 mg total) by mouth daily. 90 tablet 3  . ezetimibe (ZETIA) 10 MG tablet Take 1 tablet (10 mg total) by mouth daily. 90 tablet 3  . furosemide (LASIX) 20 MG tablet TAKE 1 TABLET BY MOUTH  DAILY 90 tablet 3  . gabapentin (NEURONTIN) 300 MG capsule Take 2 capsules (600 mg total) by mouth at bedtime. 180 capsule 1  . metoprolol succinate (TOPROL-XL) 50 MG 24 hr tablet TAKE 3 TABLETS (150 MG  TOTAL) DAILY.TAKE WITH OR  IMMEDIATELY FOLLOWING A  MEAL 270 tablet 3  . nitroGLYCERIN (NITROSTAT) 0.4 MG SL tablet Place 1 tablet (0.4 mg total) under the tongue every 5 (five) minutes as needed. 25 tablet 2  . pantoprazole (PROTONIX) 40 MG tablet TAKE 1 TABLET BY MOUTH  DAILY 90 tablet 3  . potassium chloride (KLOR-CON) 10 MEQ tablet TAKE 1 TABLET BY MOUTH  TWICE DAILY 180 tablet 3  . rivaroxaban (XARELTO) 20 MG TABS tablet Take 1 tablet (20 mg total) by mouth daily with supper. 90 tablet 3  . Spacer/Aero-Holding Chambers (AEROCHAMBER MV) inhaler Use as instructed 1 each 0  . traZODone (DESYREL) 50 MG tablet TAKE 0.5-1 TABLETS BY MOUTH AT BEDTIME AS NEEDED FOR SLEEP. 90 tablet 2  . valsartan (DIOVAN) 320 MG tablet Take 1 tablet (320 mg total) by mouth daily. 90 tablet 3  . vitamin B-12 (CYANOCOBALAMIN) 1000 MCG tablet Take 1 tablet (1,000 mcg total) by mouth daily.     No current facility-administered medications for this visit.      ___________________________________________________________________ Objective   Exam:  BP 130/80   Pulse 77   Ht 5\' 11"  (1.803 m)   Wt 219 lb (99.3 kg)   BMI 30.54 kg/m  Wt Readings from Last 3 Encounters:  03/14/20 219 lb (99.3 kg)  02/06/20 219 lb 9 oz (99.6 kg)  12/14/19 219 lb 9.6 oz (99.6 kg)     General: Pleasant and conversational, well-appearing, breathing comfortably on room air.  Eyes: sclera anicteric, no redness  ENT: oral mucosa  moist without lesions, no cervical or supraclavicular lymphadenopathy  CV: RRR without murmur, S1/S2, no JVD, no peripheral edema  Resp: Bibasilar fine expiratory wheezing bilaterally, normal RR and effort noted  GI: soft, no tenderness, with active bowel sounds. No guarding or palpable organomegaly noted.  Skin; warm and dry, no rash or jaundice noted  Neuro: awake, alert and oriented x 3. Normal gross motor function and fluent speech  Labs:  CBC Latest Ref Rng & Units 02/06/2020 01/30/2020 03/09/2019  WBC 4.0 - 10.5 K/uL 7.3 5.8 8.1  Hemoglobin 13.0 - 17.0 g/dL 11.4(L) 11.0(L) 14.0  Hematocrit 39.0 - 52.0 % 36.4(L) 34.4(L) 42.0  Platelets 150.0 - 400.0 K/uL 176.0 169.0 168.0   MCV 78  Lab Results  Component Value Date   IRON 37 (L) 02/06/2020   FERRITIN 9.4 (L) 02/06/2020  7% saturation Folate normal at 11.5 B12 normal at 689  Radiologic Studies:  TTE 10/06/2019  1. Left ventricular ejection fraction, by estimation, is 60 to 65%. The  left ventricle has normal function. The left ventricle has no regional  wall motion abnormalities. There is moderate concentric left ventricular  hypertrophy. Left ventricular  diastolic parameters are consistent with Grade II diastolic dysfunction  (pseudonormalization).   2. Right ventricular systolic function is normal. The right ventricular  size is mildly enlarged. There is normal pulmonary artery systolic  pressure.   3. Left atrial size was mildly dilated.   4. The mitral valve is normal in structure. Trivial mitral valve  regurgitation. No evidence of mitral stenosis.   5. The aortic valve has an indeterminant number of cusps. Aortic valve  regurgitation is mild to moderate. No aortic stenosis is present.   6. The inferior vena cava is normal in size with greater than 50%  respiratory variability, suggesting right atrial pressure of 3 mmHg.    ________________________________________  Cardiac catheterization report from  01/11/2019:   Prox LAD to Mid LAD lesion is 80% stenosed.  A drug-eluting stent was successfully placed using a STENT RESOLUTE ONYX 3.5X18.  Post intervention, there is a 0% residual stenosis.  Mid LAD lesion is 70% stenosed.  A drug-eluting stent was successfully placed using a STENT RESOLUTE ONYX 2.75X26.  Post intervention, there is a 0% residual stenosis.   1. Severe, heavily calcified stenoses in the proximal and mid LAD 2. Successful orbital atherectomy of the proximal and mid LAD 3. Successful placement of 2 drug eluting stents in the proximal and mid LAD (overlapping)   Recommendations: Continue ASA and Plavix for at least six months.   Theodore [name] tells me that his follow-up conversations with Dr. [name] indicated that he should remain on Plavix indefinitely due to the location and severity of his LAD lesion.  Assessment: Encounter Diagnoses  Name Primary?  . Iron deficiency anemia due to chronic blood loss Yes  . Heme positive stool   . Hiatal hernia   . Long term (current) use of antithrombotics/antiplatelets     Chronic occult GI blood loss that has now led to iron deficiency anemia.  Negative endoscopic work-up for heme positive stool in October 2020, leading to probable conclusion that he has an obscure small bowel source, most commonly AVM(s).  Also common in this scenario are gastric erosions within the herniated stomach.  These can also be subtle on endoscopic exam and also, go over time.  We should be conservative if possible with endoscopic testing on this patient due to his medical complexity, risk for sedation due to his cardiopulmonary disease and his antiplatelet and anticoagulation therapy for A. fib and coronary disease.  Plan:  My recommendation at this point is for Theodore [name] to have a repeat CBC and iron studies with Dr. Danise Mina in 4 to 6 weeks.  If these normalize, then I would forego any other endoscopic testing at this point.  I suspect that either gastric  erosions within the hernia sac or small bowel AVMs are likely culprits here, and the AVMs are most often beyond the reach of upper endoscope.  Given the challenges and risk of endoscopic procedures in this patient as noted above, I would favor a conservative approach if clinically appropriate. If he responds well to iron, and even if he needs it periodically, as long as he does not develop overt GI bleeding or other worrisome GI symptoms, then he may just be best with long-term iron supplementation. I will forward my note to primary care in this patient's cardiologist and have ongoing dialogue about his management. Theodore [name] was happy for the review of his condition and he was reassured and comfortable with the plan. (Total 60-minute time, complex conditions, extensive chart review required) Thank you for the courtesy of this consult.  Please call me with any questions or concerns.  Nelida Meuse III  CC: Referring provider noted above

## 2020-03-24 NOTE — Progress Notes (Signed)
Cardiology Office Note   Date:  03/28/2020   ID:  Theodore [name], DOB 17-May-1934, MRN [id number]  PCP:  [name], MD  Cardiologist:  Skeet Latch, MD  Electrophysiologist:  Constance Haw, MD   Evaluation Performed:  Follow-Up Visit  Chief Complaint:  Atrial fibrillation, neuropathy  History of Present Illness:     Theodore [name] is a 86 y.o. male with CAD s/p LAD PCI, chronic diastolic heart failure, hypertension, hyperlipidemia, persistent atrial fibrillation s/p ablation, asthma and Rheumatic fever who presents for follow up.  Theodore [name] was previously a patient of Dr. Mare Ferrari.  Theodore [name] was diagnosed with atrial fibrillation in 2014.  He was initially on Pradaxa and switched to Xarelto.  He underwent ablation on 12/19/15 with Dr. [name] and has remained in sinus rhythm.  He had an episode of chest pain 08/2014 and had a Lexiscan Myoview 08/31/14 that showed an old inferior scar with peri-infarct ischemia and LVEF 49%.  He then had a LHC that revealed 70% ostial LAD, 50% prox LAD, 30% OM1 and 35% RCA.  He was managed medically.  He continued to report exertional symptoms despite medical management. He had a LHC with Dr. Ellyn Hack on 09/28/15 that again revealed a 70% ostial-proximal LAD lesion.  Dr. Ellyn Hack performed FFR on that lesion and there was no significant change.  The left ventriculogram performed at that time revealed LVEF 35-45%.  This subsequently improved to 50% on 03/2018.  He again underwent left heart cath on 01/10/2019 and there was progression of his LAD lesion.  He underwent atherectomy and PCI with drug-eluting stent placement 01/11/2019.  Since then he has noted an improvement in his breathing.  He is back to playing golf a couple days per week.  He continues to have some exertional dyspnea but it has improved.  He has no lower extremity edema, orthopnea, or PND.   Atorvastatin was increased to 80 mg but he noted leg pain.  He was seen in the lipid clinic and  started back on atorvastatin 40mg  and zetia 10 mg.    Theodore [name] underwent L knee replacement on  02/09/17.  Xarelto was held for 2 days prior to surgery and resumed post-operatively.  He developed bleeding at the incision site so Xarelto was again held and he was switched to aspirin.  He subsequently developed a left lower extremity DVT and pulmonary embolism.  Xarelto was resumed. He has been struggling with peripheral neuropathy.  It started a couple weeks after the cath and he thought it was related to Plavix.  He saw Dr. [name] and switched from clopidogrel and and Xarelto to ticagrelor and Eliquis.  His neuropathy did not improve.  He followed up with his PCP and was started on gabapentin and nortriptyline. Theodore [name] complained of recurrent atrial fibrillation.  He wore a 14-day event monitor 07/2019 that showed some PACs, PVCs, and ventricular bigeminy but no atrial fibrillation.  His only complaint at this time is his peripheral neuropathy that mkes it hard for him to sleep at night.  He saw his neurosurgeon and was told that he does have peripheral neuropathy and that it may be heart related because he doesn't have enough power to get the blood to his feet.  He plans to start back golfing when it warms up.  His BP has been well-controlled.     Past Medical History:  Diagnosis Date  . CAP (community acquired pneumonia) 01/19/2018  . History of arthritis   .  History of rheumatic fever   . Hypercholesterolemia   . Hypertension   . Nocturia   . PAF (paroxysmal atrial fibrillation) [facility]) January 2013   Placed on Pradaxa. Did not require cardioversion; spontaneously converted  . Peripheral edema   . Right bundle branch block   . SOB (shortness of breath)   . Tendonitis of elbow, left     Past Surgical History:  Procedure Laterality Date  . ATRIAL FIBRILLATION ABLATION  12/19/2015  . BACK SURGERY  12/24/09   fusion C3-C4  . BACK SURGERY  2010   fusion L4-L5 [facility])  . CARDIAC  CATHETERIZATION  2009   NONOBSTRUCTIVE ATHERSCLEROTIC CORONARY DISEASE AND NORMAL  LV FUNCTION  . CARDIAC CATHETERIZATION N/A 09/08/2014   Procedure: Left Heart Cath and Coronary Angiography;  Surgeon: Belva Crome, MD;  Location: [place] [facility];  Service: Cardiovascular;  Laterality: N/A;  . CARDIAC CATHETERIZATION N/A 09/28/2015   Procedure: Left Heart Cath and Coronary Angiography;  Surgeon: Leonie Man, MD;  Location: [place] [facility];  Service: Cardiovascular;  Laterality: N/A;  . CARDIAC CATHETERIZATION N/A 09/28/2015   Procedure: Intravascular Pressure Wire/FFR Study;  Surgeon: Leonie Man, MD;  Location: [place] [facility];  Service: Cardiovascular;  Laterality: N/A;  . CARDIOVERSION N/A 10/23/2015   Procedure: CARDIOVERSION;  Surgeon: [name], MD;  Location: [facility] ENDOSCOPY;  Service: Cardiovascular;  Laterality: N/A;  . COLONOSCOPY  2013   per patient, rpt 5 yrs  . COLONOSCOPY WITH PROPOFOL N/A 12/02/2018   TAx2, HP, diverticulosis [name], MD)  . CORONARY ATHERECTOMY N/A 01/11/2019   Procedure: CORONARY ATHERECTOMY;  Surgeon: Burnell [name], MD;  Location: [place] [facility];  Service: Cardiovascular;  Laterality: N/A;  . CORONARY STENT INTERVENTION N/A 01/11/2019   Procedure: CORONARY STENT INTERVENTION;  Surgeon: Burnell [name], MD;  Location: [place] [facility];  Service: Cardiovascular;  Laterality: N/A;  . ELECTROPHYSIOLOGIC STUDY N/A 12/19/2015   Procedure: Atrial Fibrillation Ablation;  Surgeon: [name], MD;  Location: [place] [facility];  Service: Cardiovascular;  Laterality: N/A;  . ESOPHAGOGASTRODUODENOSCOPY (EGD) WITH PROPOFOL N/A 12/02/2018   medium HH [name], MD)  . INTRAVASCULAR PRESSURE WIRE/FFR STUDY N/A 01/10/2019   Procedure: INTRAVASCULAR PRESSURE WIRE/FFR STUDY;  Surgeon: Leonie Man, MD;  Location: [place] [facility];  Service: Cardiovascular;  Laterality: N/A;  . KNEE ARTHROSCOPY Left 03/2016    Dr. [name]  . LEFT HEART CATH AND CORONARY ANGIOGRAPHY N/A 01/10/2019   Procedure: LEFT HEART CATH AND CORONARY ANGIOGRAPHY;  Surgeon: Leonie Man, MD;  Location: [place] [facility];  Service: Cardiovascular;  Laterality: N/A;  . PATELLAR TENDON REPAIR Left 2008  . POLYPECTOMY  12/02/2018   Procedure: POLYPECTOMY;  Surgeon: [name], MD;  Location: [facility] ENDOSCOPY;  Service: Endoscopy;;  . REPLACEMENT TOTAL KNEE Right 2006  . TOTAL KNEE ARTHROPLASTY Left 02/09/2017   Procedure: LEFT TOTAL KNEE ARTHROPLASTY, EXCISION LEFT DISTAL THIGH MASS;  Surgeon: [name], MD;  Location: [facility] ORS;  Service: Orthopedics;  Laterality: Left;  90 mins  . TRICEPS TENDON REPAIR Left 2013     Current Outpatient Medications  Medication Sig Dispense Refill  . acetaminophen (TYLENOL) 500 MG tablet Take 1 tablet (500 mg total) by mouth 3 (three) times daily as needed. 30 tablet 0  . amoxicillin (AMOXIL) 500 MG tablet amoxicillin 500 mg tablet  TAKE 4 TABLETS BY MOUTH 1 HOUR PRIOR TO DENTAL APPOINTMENT    . Ascorbic Acid (VITAMIN C  PO) Take 500 mg by mouth daily.    [name] atorvastatin (LIPITOR) 40 MG tablet TAKE 1 TABLET BY MOUTH  DAILY 90 tablet 3  . Budeson-Glycopyrrol-Formoterol (BREZTRI AEROSPHERE) 160-9-4.8 MCG/ACT AERO Inhale 2 puffs into the lungs 2 (two) times daily. 32.1 g 3  . Cholecalciferol (VITAMIN D3) 2000 units TABS Take 2,000 Units by mouth daily.    . clopidogrel (PLAVIX) 75 MG tablet Take 1 tablet (75 mg total) by mouth daily. 90 tablet 3  . ezetimibe (ZETIA) 10 MG tablet Take 1 tablet (10 mg total) by mouth daily. 90 tablet 3  . furosemide (LASIX) 20 MG tablet TAKE 1 TABLET BY MOUTH  DAILY 90 tablet 3  . gabapentin (NEURONTIN) 300 MG capsule Take 2 capsules (600 mg total) by mouth at bedtime. 180 capsule 1  . metoprolol succinate (TOPROL-XL) 50 MG 24 hr tablet TAKE 3 TABLETS (150 MG  TOTAL) DAILY.TAKE WITH OR  IMMEDIATELY FOLLOWING A  MEAL 270 tablet 3  . nitroGLYCERIN (NITROSTAT) 0.4 MG SL  tablet Place 1 tablet (0.4 mg total) under the tongue every 5 (five) minutes as needed. 25 tablet 2  . pantoprazole (PROTONIX) 40 MG tablet TAKE 1 TABLET BY MOUTH  DAILY 90 tablet 3  . potassium chloride (KLOR-CON) 10 MEQ tablet TAKE 1 TABLET BY MOUTH  TWICE DAILY 180 tablet 3  . rivaroxaban (XARELTO) 20 MG TABS tablet Take 1 tablet (20 mg total) by mouth daily with supper. 90 tablet 3  . Spacer/Aero-Holding Chambers (AEROCHAMBER MV) inhaler Use as instructed 1 each 0  . traZODone (DESYREL) 50 MG tablet TAKE 0.5-1 TABLETS BY MOUTH AT BEDTIME AS NEEDED FOR SLEEP. 90 tablet 2  . valsartan (DIOVAN) 320 MG tablet Take 1 tablet (320 mg total) by mouth daily. 90 tablet 3  . vitamin B-12 (CYANOCOBALAMIN) 1000 MCG tablet Take 1 tablet (1,000 mcg total) by mouth daily.     No current facility-administered medications for this visit.    Allergies:   Penicillins, Amlodipine, Effexor [venlafaxine], Lisinopril, Mevacor [lovastatin], Plavix [clopidogrel], Vicodin [hydrocodone-acetaminophen], Zocor [simvastatin], and Oseltamivir phosphate    Social History:  The patient  reports that he has never smoked. He has never used smokeless tobacco. He reports current alcohol use of about 14.0 standard drinks of alcohol per week. He reports that he does not use drugs.   Family History:  The patient's family history includes Asthma in his sister; COPD in his brother and father; Heart attack in his father; Heart disease in his brother; Hypertension in his father.    ROS:  Please see the history of present illness.   Otherwise, review of systems are positive for none.   All other systems are reviewed and negative.    PHYSICAL EXAM: VS:  BP 124/76   Pulse 62   Ht 5\' 11"  (1.803 m)   Wt 220 lb (99.8 kg)   SpO2 96%   BMI 30.68 kg/m  , BMI Body mass index is 30.68 kg/m. GENERAL:  Well appearing HEENT: Pupils equal round and reactive, fundi not visualized, oral mucosa unremarkable NECK:  No jugular venous  distention, waveform within normal limits, carotid upstroke brisk and symmetric, no bruits LUNGS:  Clear to auscultation bilaterally HEART:  RRR.  PMI not displaced or sustained,S1 and S2 within normal limits, no S3, no S4, no clicks, no rubs, no murmurs ABD:  Flat, positive bowel sounds normal in frequency in pitch, no bruits, no rebound, no guarding, no midline pulsatile mass, no hepatomegaly, no splenomegaly EXT:  2  plus pulses throughout, no edema, no cyanosis no clubbing SKIN:  No rashes no nodules NEURO:  Cranial nerves II through XII grossly intact, motor grossly intact throughout PSYCH:  Cognitively intact, oriented to person place and time   EKG:  EKG is ordered today. 10/30/15: Sinus rhythm.  Rate 60 bpm.  RBBB.  LAD.  The ekg ordered 09/25/15 demonstrates atrial fibrillation.  Rate 84 bpm.  03/28/20: Sinus rhythm.  Rate 62 bpm.  Right bundle branch block.  Echo 03/25/18:   1. The left ventricle has a visually estimated ejection fraction of of 50%. The cavity size was normal. There is mildly increased left ventricular wall thickness. Left ventricular diastolic Doppler parameters are consistent with impaired relaxation Left  ventricular diffuse hypokinesis.  2. The right ventricle has mildly reduced systolic function. The cavity was mildly enlarged. There is no increase in right ventricular wall thickness.  3. Left atrial size was mildly dilated.  4. Right atrial size was mildly dilated.  5. The mitral valve is normal in structure. No evidence of mitral valve stenosis. Trivial regurgitation.  6. The tricuspid valve is normal in structure.  7. The aortic valve is tricuspid There is mild calcification of the aortic valve. Aortic valve regurgitation is trivial by color flow Doppler. No stenosis.  8. The pulmonic valve was normal in structure.  9. There is mild dilatation of the aortic root measuring 38 mm. 10. Right atrial pressure is estimated at 3 mmHg. 11. No complete TR doppler jet  so unable to estimate PA systolic pressure.  Lexiscan Myoview 10/17/15:  The left ventricular ejection fraction is moderately decreased (30-44%).  Nuclear stress EF: 39%.  There was no ST segment deviation noted during stress.  There is a small defect of moderate severity present in the apical lateral and apex location and medium sized defect of moderate severity in the basal inferior, mild inferior and apical inferior location. These defects are fixed and consistent with scar. No ischemia noted.  This is an intermediate risk study due to LV dysfunction.  LHC 01/10/19:  Prox LAD lesion is 80% stenosed. Prox LAD to Mid LAD lesion is 60% stenosed with 50% stenosed side branch in 2nd Diag.  Dist LAD lesion is 45% stenosed.  Ost Cx to Prox Cx lesion is 10% stenosed. 1st Mrg lesion is 30% stenosed. Prox Cx to Mid Cx lesion is 30% stenosed.  -----------  Dist RCA lesion is 30% stenosed with 30% stenosed side branch in RPDA.  -----------  The left ventricular systolic function is normal. The left ventricular ejection fraction is 55-65% by visual estimate.  Diagnostic Dominance: Right   PCI 01/11/19:  Prox LAD to Mid LAD lesion is 80% stenosed.  A drug-eluting stent was successfully placed using a STENT RESOLUTE ONYX 3.5X18.  Post intervention, there is a 0% residual stenosis.  Mid LAD lesion is 70% stenosed.  A drug-eluting stent was successfully placed using a STENT RESOLUTE ONYX 2.75X26.  Post intervention, there is a 0% residual stenosis.   1. Severe, heavily calcified stenoses in the proximal and mid LAD 2. Successful orbital atherectomy of the proximal and mid LAD 3. Successful placement of 2 drug eluting stents in the proximal and mid LAD (overlapping)  Recommendations: Continue ASA and Plavix for at least six months.   14 Day Event Monitor 07/2019:  Quality: Fair.  Baseline artifact. Predominant rhythm: sinus rhythm Average heart rate: 69 bpm Max heart rate:  113 bpm Min heart rate: 52 bpm  PACs, PVCs and  ventricular bigeminy.   Recent Labs: 01/30/2020: ALT 15; BUN 16; Creatinine, Ser 1.04; Potassium 4.1; Sodium 135 02/06/2020: Hemoglobin 11.4; Platelets 176.0; TSH 1.70    Lipid Panel    Component Value Date/Time   CHOL 116 01/30/2020 0843   CHOL 121 07/29/2016 0822   TRIG 78.0 01/30/2020 0843   HDL 43.50 01/30/2020 0843   HDL 56 07/29/2016 0822   CHOLHDL 3 01/30/2020 0843   VLDL 15.6 01/30/2020 0843   LDLCALC 57 01/30/2020 0843   LDLCALC 56 07/29/2016 0822      Wt Readings from Last 3 Encounters:  03/28/20 220 lb (99.8 kg)  03/14/20 219 lb (99.3 kg)  02/06/20 219 lb 9 oz (99.6 kg)      ASSESSMENT AND PLAN:  # Paroxysmal atrial fibrillation: Currently in sinus rhythm.  S/p ablation with Dr. [name].  Continue Xarelto and metoprolol.   This patients CHA2DS2-VASc Score and unadjusted Ischemic Stroke Rate (% per year) is equal to 7.2 % stroke rate/year from a score of 5  Above score calculated as 1 point each if present [CHF, HTN, DM, Vascular=MI/PAD/Aortic Plaque, Age if 65-74, or Male] Above score calculated as 2 points each if present [Age > 75, or Stroke/TIA/TE]   # PVCs: He denies recent palpitations.  Continue metoprolol.  # Pulmonary embolism: # Pulmonary hypertension: # L LE DVT:  Occurred in the setting of knee replacement.  Resolved.  He is now on Xarelto for atrial fibrillation.    # CAD:  # Hyperlipidemia:   Theodore [name] is doing better after his LAD PCI.  He had peripheral neuropathy that he thought was attributable to Plavix it was switched to ticagrelor.  However it did not improve.  He reports that the peripheral neuropathy started soon after starting clopidogrel.  It appears that this is a potential side effect, especially less than 1 month after starting the medication.  Given that it has been over a year, I would prefer that we stop the clopidogrel.  He is hesitant because he was told to be where the  junction of his stent was placed he was at high risk for stent closure if it were stopped.  His report does show that he needs it for 6 months.  We will clarify with Dr. Angelena Form.  His peripheral neuropathy is definitely not caused by low flow.  There is no obstruction on ABIs, though his arteries are calcified.  His ejection fraction is completely normal.  # Chronic systolic and diastolic heart failure: # Hypertension: Resolved.  LVEF improved to 50% on 03/2018.  Blood pressure well-controlled on metoprolol and valsartan.  He is euvolemic but short of breath.  Repeat echo.   # Chronic bronchitis: Management per Dr. Annamaria Boots.   Current medicines are reviewed at length with the patient today.  The patient does not have concerns regarding medicines.  The following changes have been made: none  Labs/ tests ordered today include:   Orders Placed This Encounter  Procedures  . EKG 12-Lead     Disposition:   FU with Aundra Espin C. [name], MD, [facility] in 6 months.      Signed, Totiana Everson C. [name], MD, [facility]  03/28/2020 10:13 AM    [place]

## 2020-03-28 ENCOUNTER — Ambulatory Visit (INDEPENDENT_AMBULATORY_CARE_PROVIDER_SITE_OTHER): Payer: Medicare Other | Admitting: Cardiovascular Disease

## 2020-03-28 ENCOUNTER — Other Ambulatory Visit: Payer: Self-pay

## 2020-03-28 ENCOUNTER — Encounter: Payer: Self-pay | Admitting: Cardiovascular Disease

## 2020-03-28 VITALS — BP 124/76 | HR 62 | Ht 71.0 in | Wt 220.0 lb

## 2020-03-28 DIAGNOSIS — Z9861 Coronary angioplasty status: Secondary | ICD-10-CM

## 2020-03-28 DIAGNOSIS — G609 Hereditary and idiopathic neuropathy, unspecified: Secondary | ICD-10-CM

## 2020-03-28 DIAGNOSIS — I10 Essential (primary) hypertension: Secondary | ICD-10-CM

## 2020-03-28 DIAGNOSIS — I493 Ventricular premature depolarization: Secondary | ICD-10-CM | POA: Diagnosis not present

## 2020-03-28 DIAGNOSIS — I2510 Atherosclerotic heart disease of native coronary artery without angina pectoris: Secondary | ICD-10-CM | POA: Diagnosis not present

## 2020-03-28 DIAGNOSIS — I351 Nonrheumatic aortic (valve) insufficiency: Secondary | ICD-10-CM

## 2020-03-28 DIAGNOSIS — I480 Paroxysmal atrial fibrillation: Secondary | ICD-10-CM

## 2020-03-28 NOTE — Patient Instructions (Addendum)
Medication Instructions:  PER DR MCALHANY OK TO STOP CLOPIDOGREL   *If you need a refill on your cardiac medications before your next appointment, please call your pharmacy*  Lab Work: NONE  Testing/Procedures: NONE  Follow-Up: At [facility], you and your health needs are our priority.  As part of our continuing mission to provide you with exceptional heart care, we have created designated Provider Care Teams.  These Care Teams include your primary Cardiologist (physician) and Advanced Practice Providers (APPs -  Physician Assistants and Nurse Practitioners) who all work together to provide you with the care you need, when you need it.  We recommend signing up for the patient portal called "MyChart".  Sign up information is provided on this After Visit Summary.  MyChart is used to connect with patients for Virtual Visits (Telemedicine).  Patients are able to view lab/test results, encounter notes, upcoming appointments, etc.  Non-urgent messages can be sent to your provider as well.   To learn more about what you can do with MyChart, go to [email or web address].    Your next appointment:   6 month(s)  The format for your next appointment:   In Person  Provider:   You may see Skeet Latch, MD or one of the following Advanced Practice Providers on your designated Care Team:    Kerin Ransom, PA-C  [place] [facility] [place], [place]  Coletta Memos, [place]

## 2020-03-28 NOTE — Addendum Note (Signed)
Addended by: Alvina Filbert B on: 03/28/2020 11:27 AM   Modules accepted: Orders

## 2020-05-01 ENCOUNTER — Encounter: Payer: Self-pay | Admitting: Cardiology

## 2020-05-01 ENCOUNTER — Other Ambulatory Visit: Payer: Self-pay

## 2020-05-01 ENCOUNTER — Ambulatory Visit (INDEPENDENT_AMBULATORY_CARE_PROVIDER_SITE_OTHER): Payer: Medicare Other | Admitting: Cardiology

## 2020-05-01 VITALS — BP 128/64 | HR 51 | Ht 71.0 in | Wt 220.0 lb

## 2020-05-01 DIAGNOSIS — I77819 Aortic ectasia, unspecified site: Secondary | ICD-10-CM

## 2020-05-01 DIAGNOSIS — I4819 Other persistent atrial fibrillation: Secondary | ICD-10-CM

## 2020-05-01 MED ORDER — ASPIRIN EC 81 MG PO TBEC: 81.0000 mg | DELAYED_RELEASE_TABLET | Freq: Every day | ORAL | 3 refills | Status: DC

## 2020-05-01 NOTE — Progress Notes (Signed)
Electrophysiology Office Note   Date:  05/01/2020   ID:  Theodore [name], DOB 18-Jan-1935, MRN [id number]  PCP:  [name], MD  Cardiologist:  Orlinda Blalock Electrophysiologist:  Dr [name]    CC: Follow up for persistent atrial fibrillation   History of Present Illness: Theodore [name] is a 86 y.o. male who presents today for electrophysiology evaluation.     He has a history of hypertension, hyperlipidemia, persistent atrial fibrillation, rheumatic fever, coronary artery disease.  He was diagnosed with atrial fibrillation in 2014.  He is status post AF ablation 12/19/2015.  He had a heart catheterization which found an 80% LAD lesion now status post drug-eluting stent.  Today, denies symptoms of palpitations, chest pain, shortness of breath, orthopnea, PND, lower extremity edema, claudication, dizziness, presyncope, syncope, bleeding, or neurologic sequela. The patient is tolerating medications without difficulties.  Overall he feels well.  He has no chest pain or shortness of breath.  He has noted intermittent lower extremity edema.  After his left heart cath and stent, his Lasix dose was reduced to 20 mg.  Past Medical History:  Diagnosis Date  . CAP (community acquired pneumonia) 01/19/2018  . History of arthritis   . History of rheumatic fever   . Hypercholesterolemia   . Hypertension   . Nocturia   . PAF (paroxysmal atrial fibrillation) [facility]) January 2013   Placed on Pradaxa. Did not require cardioversion; spontaneously converted  . Peripheral edema   . Right bundle branch block   . SOB (shortness of breath)   . Tendonitis of elbow, left    Past Surgical History:  Procedure Laterality Date  . ATRIAL FIBRILLATION ABLATION  12/19/2015  . BACK SURGERY  12/24/09   fusion C3-C4  . BACK SURGERY  2010   fusion L4-L5 [facility])  . CARDIAC CATHETERIZATION  2009   NONOBSTRUCTIVE ATHERSCLEROTIC CORONARY DISEASE AND NORMAL  LV FUNCTION  . CARDIAC CATHETERIZATION N/A  09/08/2014   Procedure: Left Heart Cath and Coronary Angiography;  Surgeon: Belva Crome, MD;  Location: [place] [facility];  Service: Cardiovascular;  Laterality: N/A;  . CARDIAC CATHETERIZATION N/A 09/28/2015   Procedure: Left Heart Cath and Coronary Angiography;  Surgeon: Leonie Man, MD;  Location: [place] [facility];  Service: Cardiovascular;  Laterality: N/A;  . CARDIAC CATHETERIZATION N/A 09/28/2015   Procedure: Intravascular Pressure Wire/FFR Study;  Surgeon: Leonie Man, MD;  Location: [place] [facility];  Service: Cardiovascular;  Laterality: N/A;  . CARDIOVERSION N/A 10/23/2015   Procedure: CARDIOVERSION;  Surgeon: [name], MD;  Location: [facility] ENDOSCOPY;  Service: Cardiovascular;  Laterality: N/A;  . COLONOSCOPY  2013   per patient, rpt 5 yrs  . COLONOSCOPY WITH PROPOFOL N/A 12/02/2018   TAx2, HP, diverticulosis [name], MD)  . CORONARY ATHERECTOMY N/A 01/11/2019   Procedure: CORONARY ATHERECTOMY;  Surgeon: Burnell [name], MD;  Location: [place] [facility];  Service: Cardiovascular;  Laterality: N/A;  . CORONARY STENT INTERVENTION N/A 01/11/2019   Procedure: CORONARY STENT INTERVENTION;  Surgeon: Burnell [name], MD;  Location: [place] [facility];  Service: Cardiovascular;  Laterality: N/A;  . ELECTROPHYSIOLOGIC STUDY N/A 12/19/2015   Procedure: Atrial Fibrillation Ablation;  Surgeon: [name], MD;  Location: [place] [facility];  Service: Cardiovascular;  Laterality: N/A;  . ESOPHAGOGASTRODUODENOSCOPY (EGD) WITH PROPOFOL N/A 12/02/2018   medium HH [name], MD)  . INTRAVASCULAR PRESSURE WIRE/FFR STUDY N/A 01/10/2019   Procedure: INTRAVASCULAR PRESSURE WIRE/FFR STUDY;  Surgeon: Ellyn Hack,  Leonie Green, MD;  Location: [place] [facility];  Service: Cardiovascular;  Laterality: N/A;  . KNEE ARTHROSCOPY Left 03/2016   Dr. [name]  . LEFT HEART CATH AND CORONARY ANGIOGRAPHY N/A 01/10/2019   Procedure: LEFT HEART CATH AND CORONARY ANGIOGRAPHY;   Surgeon: Leonie Man, MD;  Location: [place] [facility];  Service: Cardiovascular;  Laterality: N/A;  . PATELLAR TENDON REPAIR Left 2008  . POLYPECTOMY  12/02/2018   Procedure: POLYPECTOMY;  Surgeon: [name], MD;  Location: [facility] ENDOSCOPY;  Service: Endoscopy;;  . REPLACEMENT TOTAL KNEE Right 2006  . TOTAL KNEE ARTHROPLASTY Left 02/09/2017   Procedure: LEFT TOTAL KNEE ARTHROPLASTY, EXCISION LEFT DISTAL THIGH MASS;  Surgeon: [name], MD;  Location: [facility] ORS;  Service: Orthopedics;  Laterality: Left;  90 mins  . TRICEPS TENDON REPAIR Left 2013     Current Outpatient Medications  Medication Sig Dispense Refill  . acetaminophen (TYLENOL) 500 MG tablet Take 1 tablet (500 mg total) by mouth 3 (three) times daily as needed. 30 tablet 0  . amoxicillin (AMOXIL) 500 MG tablet amoxicillin 500 mg tablet  TAKE 4 TABLETS BY MOUTH 1 HOUR PRIOR TO DENTAL APPOINTMENT    . Ascorbic Acid (VITAMIN C PO) Take 500 mg by mouth daily.    [name] aspirin EC 81 MG tablet Take 1 tablet (81 mg total) by mouth daily. Swallow whole. 90 tablet 3  . atorvastatin (LIPITOR) 40 MG tablet TAKE 1 TABLET BY MOUTH  DAILY 90 tablet 3  . Budeson-Glycopyrrol-Formoterol (BREZTRI AEROSPHERE) 160-9-4.8 MCG/ACT AERO Inhale 2 puffs into the lungs 2 (two) times daily. 32.1 g 3  . Cholecalciferol (VITAMIN D3) 2000 units TABS Take 2,000 Units by mouth daily.    [name] ezetimibe (ZETIA) 10 MG tablet Take 1 tablet (10 mg total) by mouth daily. 90 tablet 3  . furosemide (LASIX) 20 MG tablet TAKE 1 TABLET BY MOUTH  DAILY 90 tablet 3  . gabapentin (NEURONTIN) 300 MG capsule Take 900 mg by mouth daily.    . metoprolol succinate (TOPROL-XL) 50 MG 24 hr tablet TAKE 3 TABLETS (150 MG  TOTAL) DAILY.TAKE WITH OR  IMMEDIATELY FOLLOWING A  MEAL 270 tablet 3  . nitroGLYCERIN (NITROSTAT) 0.4 MG SL tablet Place 1 tablet (0.4 mg total) under the tongue every 5 (five) minutes as needed. 25 tablet 2  . pantoprazole (PROTONIX) 40 MG tablet TAKE 1 TABLET BY  MOUTH  DAILY 90 tablet 3  . potassium chloride (KLOR-CON) 10 MEQ tablet TAKE 1 TABLET BY MOUTH  TWICE DAILY 180 tablet 3  . Spacer/Aero-Holding Chambers (AEROCHAMBER MV) inhaler Use as instructed 1 each 0  . traZODone (DESYREL) 50 MG tablet TAKE 0.5-1 TABLETS BY MOUTH AT BEDTIME AS NEEDED FOR SLEEP. 90 tablet 2  . valsartan (DIOVAN) 320 MG tablet Take 1 tablet (320 mg total) by mouth daily. 90 tablet 3  . vitamin B-12 (CYANOCOBALAMIN) 1000 MCG tablet Take 1 tablet (1,000 mcg total) by mouth daily.     No current facility-administered medications for this visit.    Allergies:   Penicillins, Amlodipine, Effexor [venlafaxine], Lisinopril, Mevacor [lovastatin], Plavix [clopidogrel], Vicodin [hydrocodone-acetaminophen], Zocor [simvastatin], and Oseltamivir phosphate   Social History:  The patient  reports that he has never smoked. He has never used smokeless tobacco. He reports current alcohol use of about 14.0 standard drinks of alcohol per week. He reports that he does not use drugs.   Family History:  The patient's family history includes Asthma in his sister; COPD in his brother and  father; Heart attack in his father; Heart disease in his brother; Hypertension in his father.    ROS:  Please see the history of present illness.   Otherwise, review of systems is positive for none.   All other systems are reviewed and negative.   PHYSICAL EXAM: VS:  BP 128/64   Pulse (!) 51   Ht 5\' 11"  (1.803 m)   Wt 220 lb (99.8 kg)   SpO2 99%   BMI 30.68 kg/m  , BMI Body mass index is 30.68 kg/m. GEN: Well nourished, well developed, in no acute distress  HEENT: normal  Neck: no JVD, carotid bruits, or masses Cardiac: RRR; no murmurs, rubs, or gallops,no edema  Respiratory:  clear to auscultation bilaterally, normal work of breathing GI: soft, nontender, nondistended, + BS MS: no deformity or atrophy  Skin: warm and dry Neuro:  Strength and sensation are intact Psych: euthymic mood, full  affect  EKG:  EKG is not ordered today. Personal review of the ekg ordered 03/28/20 shows sinus rhythm, right bundle branch block, rate 62   Recent Labs: 01/30/2020: ALT 15; BUN 16; Creatinine, Ser 1.04; Potassium 4.1; Sodium 135 02/06/2020: Hemoglobin 11.4; Platelets 176.0; TSH 1.70    Lipid Panel     Component Value Date/Time   CHOL 116 01/30/2020 0843   CHOL 121 07/29/2016 0822   TRIG 78.0 01/30/2020 0843   HDL 43.50 01/30/2020 0843   HDL 56 07/29/2016 0822   CHOLHDL 3 01/30/2020 0843   VLDL 15.6 01/30/2020 0843   LDLCALC 57 01/30/2020 0843   LDLCALC 56 07/29/2016 0822     Wt Readings from Last 3 Encounters:  05/01/20 220 lb (99.8 kg)  03/28/20 220 lb (99.8 kg)  03/14/20 219 lb (99.3 kg)      Other studies Reviewed: Additional studies/ records that were reviewed today include: Cardiac monitor 07/26/2019 personally reviewed Review of the above records today demonstrates:  Predominant rhythm: sinus rhythm Average heart rate: 69 bpm Max heart rate: 113 bpm Min heart rate: 52 bpm PACs, PVCs and ventricular bigeminy.  TTE 10/06/2019 1. Left ventricular ejection fraction, by estimation, is 60 to 65%. The  left ventricle has normal function. The left ventricle has no regional  wall motion abnormalities. There is moderate concentric left ventricular  hypertrophy. Left ventricular  diastolic parameters are consistent with Grade II diastolic dysfunction  (pseudonormalization).  2. Right ventricular systolic function is normal. The right ventricular  size is mildly enlarged. There is normal pulmonary artery systolic  pressure.  3. Left atrial size was mildly dilated.  4. The mitral valve is normal in structure. Trivial mitral valve  regurgitation. No evidence of mitral stenosis.  5. The aortic valve has an indeterminant number of cusps. Aortic valve  regurgitation is mild to moderate. No aortic stenosis is present.  6. The inferior vena cava is normal in size with  greater than 50%  respiratory variability, suggesting right atrial pressure of 3 mmHg.   ASSESSMENT AND PLAN:  1.  Persistent atrial fibrillation: Status post ablation 12/19/2015.  Currently on metoprolol and Xarelto.  CHA2DS2-VASc of 5.  He has not had any further episodes of atrial fibrillation.  He is also having issues with bleeding.  Due to that, we will stop his Xarelto.  He is also off Plavix.  We will start him on a baby aspirin.   2.  Coronary artery disease: Status post LAD stent.  No current chest pain.  Plan to start baby aspirin.  3.  Chronic  systolic heart failure: Currently on metoprolol, losartan, Lasix.  Ejection fraction has normalized.    4.  Hypertension: Currently well controlled.  He is having some lower extremity edema.  He I told him he can take an extra dose of Lasix as needed.  5.  Hyperlipidemia: Continue Lipitor  6.  Ectatic abdominal aorta: Plan for repeat ultrasound  Case discussed with primary cardiology  Current medicines are reviewed at length with the patient today.   The patient does not have concerns regarding his medicines.  The following changes were made today: Stop Xarelto, start aspirin  Labs/ tests ordered today include:  Orders Placed This Encounter  Procedures  . VAS [place] AAA DUPLEX     Disposition:   FU with Will Camnitz 6 months  Signed, [name], MD  05/01/2020 9:58 AM     [facility] [address] [place] [address] [phone] (office) [phone] (fax)

## 2020-05-01 NOTE — Patient Instructions (Addendum)
Medication Instructions:  Your physician has recommended you make the following change in your medication:  1. STOP Xarelto 2. START Aspirin 81 mg once daily 3. Continue Lasix 20 mg once daily, you can take an extra 20 mg as needed  *If you need a refill on your cardiac medications before your next appointment, please call your pharmacy*   Lab Work: None ordered   Testing/Procedures: Your physician has requested that you have an abdominal aorta duplex. During this test, an ultrasound is used to evaluate the aorta. Allow 30 minutes for this exam. Do not eat after midnight the day before and avoid carbonated beverages   Follow-Up: At [facility], you and your health needs are our priority.  As part of our continuing mission to provide you with exceptional heart care, we have created designated Provider Care Teams.  These Care Teams include your primary Cardiologist (physician) and Advanced Practice Providers (APPs -  Physician Assistants and Nurse Practitioners) who all work together to provide you with the care you need, when you need it.   Your next appointment:   6 month(s)  The format for your next appointment:   In Person  Provider:   Allegra Lai, MD    Thank you for choosing [facility]!!   Trinidad Curet, RN [phone]   Other Instructions

## 2020-05-14 ENCOUNTER — Ambulatory Visit (HOSPITAL_COMMUNITY)
Admission: RE | Admit: 2020-05-14 | Discharge: 2020-05-14 | Disposition: A | Discharge status: Home / Self Care | Payer: Medicare Other | Source: Ambulatory Visit | Attending: Cardiology | Admitting: Cardiology

## 2020-05-14 ENCOUNTER — Other Ambulatory Visit: Payer: Self-pay

## 2020-05-14 DIAGNOSIS — I77819 Aortic ectasia, unspecified site: Secondary | ICD-10-CM | POA: Insufficient documentation

## 2020-05-14 DIAGNOSIS — I77811 Abdominal aortic ectasia: Secondary | ICD-10-CM

## 2020-05-29 ENCOUNTER — Other Ambulatory Visit: Payer: Self-pay | Admitting: Cardiovascular Disease

## 2020-05-29 ENCOUNTER — Other Ambulatory Visit: Payer: Self-pay | Admitting: Cardiology

## 2020-05-30 ENCOUNTER — Other Ambulatory Visit: Payer: Self-pay

## 2020-05-30 MED ORDER — VALSARTAN 320 MG PO TABS: 320.0000 mg | ORAL_TABLET | Freq: Every day | ORAL | 3 refills | Status: DC

## 2020-05-30 MED ORDER — EZETIMIBE 10 MG PO TABS: 10.0000 mg | ORAL_TABLET | Freq: Every day | ORAL | 3 refills | Status: DC

## 2020-05-30 NOTE — Telephone Encounter (Signed)
Pt called requesting refill for ezetimibe and valsartan to [facility].

## 2020-06-21 MED ORDER — FUROSEMIDE 40 MG PO TABS: 40.0000 mg | ORAL_TABLET | Freq: Every day | ORAL | 3 refills | Status: DC

## 2020-07-03 ENCOUNTER — Other Ambulatory Visit: Payer: Self-pay | Admitting: *Deleted

## 2020-07-03 DIAGNOSIS — Z5181 Encounter for therapeutic drug level monitoring: Secondary | ICD-10-CM

## 2020-07-03 DIAGNOSIS — I10 Essential (primary) hypertension: Secondary | ICD-10-CM

## 2020-07-03 LAB — BASIC METABOLIC PANEL
BUN/Creatinine Ratio: 14 (ref 10–24)
BUN: 15 mg/dL (ref 8–27)
CO2: 24 mmol/L (ref 20–29)
Calcium: 9.3 mg/dL (ref 8.6–10.2)
Chloride: 100 mmol/L (ref 96–106)
Creatinine, Ser: 1.04 mg/dL (ref 0.76–1.27)
Glucose: 98 mg/dL (ref 65–99)
Potassium: 4.4 mmol/L (ref 3.5–5.2)
Sodium: 138 mmol/L (ref 134–144)
eGFR: 71 mL/min/{1.73_m2} (ref >59)

## 2020-09-03 ENCOUNTER — Encounter (HOSPITAL_BASED_OUTPATIENT_CLINIC_OR_DEPARTMENT_OTHER): Payer: Self-pay

## 2020-09-04 ENCOUNTER — Other Ambulatory Visit: Payer: Self-pay

## 2020-09-04 ENCOUNTER — Ambulatory Visit (INDEPENDENT_AMBULATORY_CARE_PROVIDER_SITE_OTHER): Payer: Medicare Other | Admitting: Cardiovascular Disease

## 2020-09-04 ENCOUNTER — Encounter (HOSPITAL_BASED_OUTPATIENT_CLINIC_OR_DEPARTMENT_OTHER): Payer: Self-pay | Admitting: Cardiovascular Disease

## 2020-09-04 VITALS — BP 134/68 | HR 59 | Ht 71.0 in | Wt 216.0 lb

## 2020-09-04 DIAGNOSIS — E7800 Pure hypercholesterolemia, unspecified: Secondary | ICD-10-CM

## 2020-09-04 DIAGNOSIS — G609 Hereditary and idiopathic neuropathy, unspecified: Secondary | ICD-10-CM | POA: Diagnosis not present

## 2020-09-04 DIAGNOSIS — Z9861 Coronary angioplasty status: Secondary | ICD-10-CM

## 2020-09-04 DIAGNOSIS — I5042 Chronic combined systolic (congestive) and diastolic (congestive) heart failure: Secondary | ICD-10-CM

## 2020-09-04 DIAGNOSIS — I2510 Atherosclerotic heart disease of native coronary artery without angina pectoris: Secondary | ICD-10-CM

## 2020-09-04 DIAGNOSIS — I4819 Other persistent atrial fibrillation: Secondary | ICD-10-CM

## 2020-09-04 NOTE — Assessment & Plan Note (Signed)
Lipids are well-controlled on atorvastatin.  LDL goal is less than 70.  Continue Zetia.

## 2020-09-04 NOTE — Assessment & Plan Note (Signed)
This is his main complaint.  We again reviewed the fact that this is not cardiovascular in etiology.  He has great peripheral pulses and ABIs are normal.  Clopidogrel did not cause this.  His symptoms are mostly at rest, which is not consistent with claudication.  He is very frustrated that he cannot get to the root of the problem.  I suggested that he may want to get another opinion at an academic center, as he is concerned about pursuing a spinal cord stimulator.

## 2020-09-04 NOTE — Assessment & Plan Note (Signed)
History ofLVEF has recovered.  It was 60 to 65% when checked.  He still reports exertional dyspnea and occasional PND.  Symptoms have improved since starting Lasix.  We will repeat an echocardiogram to make sure there have been no changes.  Continue metoprolol and valsartan.

## 2020-09-04 NOTE — Patient Instructions (Signed)
Medication Instructions:  Your physician recommends that you continue on your current medications as directed. Please refer to the Current Medication list given to you today.  *If you need a refill on your cardiac medications before your next appointment, please call your pharmacy*  Lab Work: NONE   Testing/Procedures: Your physician has requested that you have an echocardiogram. Echocardiography is a painless test that uses sound waves to create images of your heart. It provides your doctor with information about the size and shape of your heart and how well your heart's chambers and valves are working. This procedure takes approximately one hour. There are no restrictions for this procedure. [place] [address]  Follow-Up: At [facility], you and your health needs are our priority.  As part of our continuing mission to provide you with exceptional heart care, we have created designated Provider Care Teams.  These Care Teams include your primary Cardiologist (physician) and Advanced Practice Providers (APPs -  Physician Assistants and Nurse Practitioners) who all work together to provide you with the care you need, when you need it.  We recommend signing up for the patient portal called "MyChart".  Sign up information is provided on this After Visit Summary.  MyChart is used to connect with patients for Virtual Visits (Telemedicine).  Patients are able to view lab/test results, encounter notes, upcoming appointments, etc.  Non-urgent messages can be sent to your provider as well.   To learn more about what you can do with MyChart, go to [email or web address].    Your next appointment:   12 month(s)  The format for your next appointment:   In Person  Provider:   Skeet Latch, MD

## 2020-09-04 NOTE — Progress Notes (Signed)
Cardiology Office Note   Date:  09/04/2020   ID:  Theodore [name], DOB 1934-07-30, MRN [id number]  PCP:  [name], MD  Cardiologist:  Skeet Latch, MD  Electrophysiologist:  Constance Haw, MD   Evaluation Performed:  Follow-Up Visit  Chief Complaint:  Atrial fibrillation, neuropathy  History of Present Illness:     Theodore [name] is a 86 y.o. male with CAD s/p LAD PCI, chronic diastolic heart failure, hypertension, hyperlipidemia, persistent atrial fibrillation s/p ablation, asthma and Rheumatic fever who presents for follow up.  Theodore [name] was previously a patient of Dr. Mare Ferrari.  Theodore [name] was diagnosed with atrial fibrillation in 2014.  He was initially on Pradaxa and switched to Xarelto.  He underwent ablation on 12/19/15 with Dr. [name] and has remained in sinus rhythm.  He had an episode of chest pain 08/2014 and had a Lexiscan Myoview 08/31/14 that showed an old inferior scar with peri-infarct ischemia and LVEF 49%.  He then had a LHC that revealed 70% ostial LAD, 50% prox LAD, 30% OM1 and 35% RCA.  He was managed medically.  He continued to report exertional symptoms despite medical management. He had a LHC with Dr. Ellyn Hack on 09/28/15 that again revealed a 70% ostial-proximal LAD lesion.  Dr. Ellyn Hack performed FFR on that lesion and there was no significant change.  The left ventriculogram performed at that time revealed LVEF 35-45%.  This subsequently improved to 50% on 03/2018.  He again underwent left heart cath on 01/10/2019 and there was progression of his LAD lesion.  He underwent atherectomy and PCI with drug-eluting stent placement 01/11/2019.  Since then he has noted an improvement in his breathing.  He is back to playing golf a couple days per week.  He continues to have some exertional dyspnea but it has improved.  He has no lower extremity edema, orthopnea, or PND.   Atorvastatin was increased to 80 mg but he noted leg pain.  He was seen in the lipid clinic and  started back on atorvastatin '40mg'$  and zetia 10 mg.    Theodore [name] underwent L knee replacement on  02/09/17.  Xarelto was held for 2 days prior to surgery and resumed post-operatively.  He developed bleeding at the incision site so Xarelto was again held and he was switched to aspirin.  He subsequently developed a left lower extremity DVT and pulmonary embolism.  Xarelto was resumed. He has been struggling with peripheral neuropathy.  It started a couple weeks after the cath and he thought it was related to Plavix.  He saw Dr. [name] and switched from clopidogrel and and Xarelto to ticagrelor and Eliquis.  His neuropathy did not improve.  He followed up with his PCP and was started on gabapentin and nortriptyline. Theodore [name] complained of recurrent atrial fibrillation.  He wore a 14-day event monitor 07/2019 that showed some PACs, PVCs, and ventricular bigeminy but no atrial fibrillation.    Today, he continues to have a symmetric burning sensation in his feet, and is having difficulty sleeping. This burning has become worse, and is now affecting him at all times of the day. Previously it was just occurring at night. Recently, his left foot will begin burning if he crosses his legs. Since his stent was placed almost 2 years ago, he has not felt much improvement. He is still short of breath and fatigued with minimal exertion, such as while walking and playing golf. After increasing Lasix to 40 mg he has noticed  good improvement in his LE edema. He still has some ankle edema by the end of the day. At home, his blood pressure is typically stable and well controlled with readings of 120s/70s. Of note, he recently had internal bleeding. His blood thinner was discontinued and he was started on aspirin. He believes he has not had episodes of atrial fibrillation since his ablation procedure. Yesterday, he followed-up with his neurologist, who recommended an implant procedure in his lower back. However, he is wary of this  and believes it will only treat the symptoms. Also, he had two injections in his lower back, indicated by some nerve impingement, but this has not helped. His PCP is following his cholesterol management. He denies any chest pain, or palpitations. No headaches, lightheadedness, or syncope to report. Also has no orthopnea or PND.   Past Medical History:  Diagnosis Date   CAP (community acquired pneumonia) 01/19/2018   History of arthritis    History of rheumatic fever    Hypercholesterolemia    Hypertension    Nocturia    PAF (paroxysmal atrial fibrillation) [facility]) January 2013   Placed on Pradaxa. Did not require cardioversion; spontaneously converted   Peripheral edema    Persistent atrial fibrillation ([place]) 01/21/2011   S/p ablation 12/2015 NSR up until his PCI Dec 2020 when he went into AF post PCI   Right bundle branch block    SOB (shortness of breath)    Tendonitis of elbow, left     Past Surgical History:  Procedure Laterality Date   ATRIAL FIBRILLATION ABLATION  12/19/2015   BACK SURGERY  12/24/09   fusion C3-C4   BACK SURGERY  2010   fusion L4-L5 [facility] [place])   CARDIAC CATHETERIZATION  2009   NONOBSTRUCTIVE ATHERSCLEROTIC CORONARY DISEASE AND NORMAL  LV FUNCTION   CARDIAC CATHETERIZATION N/A 09/08/2014   Procedure: Left Heart Cath and Coronary Angiography;  Surgeon: Belva Crome, MD;  Location: [place] CV LAB;  Service: Cardiovascular;  Laterality: N/A;   CARDIAC CATHETERIZATION N/A 09/28/2015   Procedure: Left Heart Cath and Coronary Angiography;  Surgeon: Leonie Man, MD;  Location: [place] CV LAB;  Service: Cardiovascular;  Laterality: N/A;   CARDIAC CATHETERIZATION N/A 09/28/2015   Procedure: Intravascular Pressure Wire/FFR Study;  Surgeon: Leonie Man, MD;  Location: [place] [facility];  Service: Cardiovascular;  Laterality: N/A;   CARDIOVERSION N/A 10/23/2015   Procedure: CARDIOVERSION;  Surgeon: [name], MD;  Location: [facility] ENDOSCOPY;  Service:  Cardiovascular;  Laterality: N/A;   COLONOSCOPY  2013   per patient, rpt 5 yrs   COLONOSCOPY WITH PROPOFOL N/A 12/02/2018   TAx2, HP, diverticulosis [name], MD)   CORONARY ATHERECTOMY N/A 01/11/2019   Procedure: CORONARY ATHERECTOMY;  Surgeon: Burnell [name], MD;  Location: [place] CV LAB;  Service: Cardiovascular;  Laterality: N/A;   CORONARY STENT INTERVENTION N/A 01/11/2019   Procedure: CORONARY STENT INTERVENTION;  Surgeon: Burnell [name], MD;  Location: [place] [facility] LAB;  Service: Cardiovascular;  Laterality: N/A;   ELECTROPHYSIOLOGIC STUDY N/A 12/19/2015   Procedure: Atrial Fibrillation Ablation;  Surgeon: [name], MD;  Location: [place] CV LAB;  Service: Cardiovascular;  Laterality: N/A;   ESOPHAGOGASTRODUODENOSCOPY (EGD) WITH PROPOFOL N/A 12/02/2018   medium HH [name], MD)   INTRAVASCULAR PRESSURE WIRE/FFR STUDY N/A 01/10/2019   Procedure: INTRAVASCULAR PRESSURE WIRE/FFR STUDY;  Surgeon: Leonie Man, MD;  Location: [place] [facility] LAB;  Service: Cardiovascular;  Laterality: N/A;   KNEE ARTHROSCOPY  Left 03/2016   Dr. [name]   LEFT HEART CATH AND CORONARY ANGIOGRAPHY N/A 01/10/2019   Procedure: LEFT HEART CATH AND CORONARY ANGIOGRAPHY;  Surgeon: Leonie Man, MD;  Location: [place] CV LAB;  Service: Cardiovascular;  Laterality: N/A;   PATELLAR TENDON REPAIR Left 2008   POLYPECTOMY  12/02/2018   Procedure: POLYPECTOMY;  Surgeon: [name], MD;  Location: [facility] ENDOSCOPY;  Service: Endoscopy;;   REPLACEMENT TOTAL KNEE Right 2006   TOTAL KNEE ARTHROPLASTY Left 02/09/2017   Procedure: LEFT TOTAL KNEE ARTHROPLASTY, EXCISION LEFT DISTAL THIGH MASS;  Surgeon: [name], MD;  Location: [facility] ORS;  Service: Orthopedics;  Laterality: Left;  90 mins   TRICEPS TENDON REPAIR Left 2013     Current Outpatient Medications  Medication Sig Dispense Refill   acetaminophen (TYLENOL) 500 MG tablet Take 1 tablet (500 mg total) by  mouth 3 (three) times daily as needed. 30 tablet 0   amoxicillin (AMOXIL) 500 MG tablet amoxicillin 500 mg tablet  TAKE 4 TABLETS BY MOUTH 1 HOUR PRIOR TO DENTAL APPOINTMENT     Ascorbic Acid (VITAMIN C PO) Take 500 mg by mouth daily.     aspirin EC 81 MG tablet Take 1 tablet (81 mg total) by mouth daily. Swallow whole. 90 tablet 3   atorvastatin (LIPITOR) 40 MG tablet TAKE 1 TABLET BY MOUTH  DAILY 90 tablet 3   Budeson-Glycopyrrol-Formoterol (BREZTRI AEROSPHERE) 160-9-4.8 MCG/ACT AERO Inhale 2 puffs into the lungs 2 (two) times daily. 32.1 g 3   Cholecalciferol (VITAMIN D3) 2000 units TABS Take 2,000 Units by mouth daily.     ezetimibe (ZETIA) 10 MG tablet Take 1 tablet (10 mg total) by mouth daily. 90 tablet 3   furosemide (LASIX) 40 MG tablet Take 1 tablet (40 mg total) by mouth daily. 90 tablet 3   gabapentin (NEURONTIN) 300 MG capsule Take 900 mg by mouth daily.     metoprolol succinate (TOPROL-XL) 50 MG 24 hr tablet TAKE 3 TABLETS (150 MG  TOTAL) DAILY.TAKE WITH OR  IMMEDIATELY FOLLOWING A  MEAL 270 tablet 3   nitroGLYCERIN (NITROSTAT) 0.4 MG SL tablet Place 1 tablet (0.4 mg total) under the tongue every 5 (five) minutes as needed. 25 tablet 2   pantoprazole (PROTONIX) 40 MG tablet TAKE 1 TABLET BY MOUTH  DAILY 90 tablet 3   potassium chloride (KLOR-CON) 10 MEQ tablet TAKE 1 TABLET BY MOUTH  TWICE DAILY 180 tablet 3   Spacer/Aero-Holding Chambers (AEROCHAMBER MV) inhaler Use as instructed 1 each 0   traZODone (DESYREL) 50 MG tablet TAKE 0.5-1 TABLETS BY MOUTH AT BEDTIME AS NEEDED FOR SLEEP. 90 tablet 2   valsartan (DIOVAN) 320 MG tablet Take 1 tablet (320 mg total) by mouth daily. 90 tablet 3   vitamin B-12 (CYANOCOBALAMIN) 1000 MCG tablet Take 1 tablet (1,000 mcg total) by mouth daily.     No current facility-administered medications for this visit.    Allergies:   Penicillins, Amlodipine, Effexor [venlafaxine], Lisinopril, Mevacor [lovastatin], Plavix [clopidogrel], Vicodin  [hydrocodone-acetaminophen], Zocor [simvastatin], and Oseltamivir phosphate    Social History:  The patient  reports that he has never smoked. He has never used smokeless tobacco. He reports current alcohol use of about 14.0 standard drinks of alcohol per week. He reports that he does not use drugs.   Family History:  The patient's family history includes Asthma in his sister; COPD in his brother and father; Heart attack in his father; Heart disease in his brother; Hypertension in his father.  ROS:   Please see the history of present illness. (+) Burning sensation, bilateral feet (+) Insomnia (+) Shortness of breath (+) Fatigue All other systems are reviewed and negative.     PHYSICAL EXAM: VS:  BP 134/68 (BP Location: Left Arm, Patient Position: Sitting)   Pulse (!) 59   Ht '5\' 11"'$  (1.803 m)   Wt 216 lb (98 kg)   BMI 30.13 kg/m  , BMI Body mass index is 30.13 kg/m. GENERAL:  Well appearing HEENT: Pupils equal round and reactive, fundi not visualized, oral mucosa unremarkable NECK:  No jugular venous distention, waveform within normal limits, carotid upstroke brisk and symmetric, no bruits LUNGS:  Clear to auscultation bilaterally HEART:  RRR.  PMI not displaced or sustained,S1 and S2 within normal limits, no S3, no S4, no clicks, no rubs, no murmurs ABD:  Flat, positive bowel sounds normal in frequency in pitch, no bruits, no rebound, no guarding, no midline pulsatile mass, no hepatomegaly, no splenomegaly EXT:  2 plus pulses throughout, no edema, no cyanosis no clubbing SKIN:  No rashes no nodules NEURO:  Cranial nerves II through XII grossly intact, motor grossly intact throughout PSYCH:  Cognitively intact, oriented to person place and time   EKG:   09/04/2020: Sinus bradycardia. Rate 59 bpm. RBBB. 1st degree AV block. LAD. 03/28/20: Sinus rhythm.  Rate 62 bpm.  Right bundle branch block. 10/30/15: Sinus rhythm.  Rate 60 bpm.  RBBB.  LAD.  09/25/15: atrial fibrillation.   Rate 84 bpm.   [place] AAA Duplex 05/14/2020: Summary:  Abdominal Aorta: No evidence of an abdominal aortic aneurysm was  visualized.   Stenosis:  Stable dimensions of abdominal aorta and common and external iliac  arteries.   Echo 10/06/2019: 1. Left ventricular ejection fraction, by estimation, is 60 to 65%. The  left ventricle has normal function. The left ventricle has no regional  wall motion abnormalities. There is moderate concentric left ventricular  hypertrophy. Left ventricular  diastolic parameters are consistent with Grade II diastolic dysfunction  (pseudonormalization).   2. Right ventricular systolic function is normal. The right ventricular  size is mildly enlarged. There is normal pulmonary artery systolic  pressure.   3. Left atrial size was mildly dilated.   4. The mitral valve is normal in structure. Trivial mitral valve  regurgitation. No evidence of mitral stenosis.   5. The aortic valve has an indeterminant number of cusps. Aortic valve  regurgitation is mild to moderate. No aortic stenosis is present.   6. The inferior vena cava is normal in size with greater than 50%  respiratory variability, suggesting right atrial pressure of 3 mmHg.  14 Day Event Monitor 07/2019:   Quality: Fair.  Baseline artifact. Predominant rhythm: sinus rhythm Average heart rate: 69 bpm Max heart rate: 113 bpm Min heart rate: 52 bpm   PACs, PVCs and ventricular bigeminy.  PCI 01/11/19: Prox LAD to Mid LAD lesion is 80% stenosed. A drug-eluting stent was successfully placed using a STENT RESOLUTE ONYX 3.5X18. Post intervention, there is a 0% residual stenosis. Mid LAD lesion is 70% stenosed. A drug-eluting stent was successfully placed using a STENT RESOLUTE ONYX 2.75X26. Post intervention, there is a 0% residual stenosis.   1. Severe, heavily calcified stenoses in the proximal and mid LAD 2. Successful orbital atherectomy of the proximal and mid LAD 3. Successful placement of 2  drug eluting stents in the proximal and mid LAD (overlapping)   Recommendations: Continue ASA and Plavix for at least six months.  LHC 01/10/19: Prox LAD lesion is 80% stenosed. Prox LAD to Mid LAD lesion is 60% stenosed with 50% stenosed side branch in 2nd Diag. Dist LAD lesion is 45% stenosed. Ost Cx to Prox Cx lesion is 10% stenosed. 1st Mrg lesion is 30% stenosed. Prox Cx to Mid Cx lesion is 30% stenosed. ----------- Dist RCA lesion is 30% stenosed with 30% stenosed side branch in RPDA. ----------- The left ventricular systolic function is normal. The left ventricular ejection fraction is 55-65% by visual estimate.  Diagnostic Dominance: Right    Echo 03/25/18:   1. The left ventricle has a visually estimated ejection fraction of of 50%. The cavity size was normal. There is mildly increased left ventricular wall thickness. Left ventricular diastolic Doppler parameters are consistent with impaired relaxation Left  ventricular diffuse hypokinesis.  2. The right ventricle has mildly reduced systolic function. The cavity was mildly enlarged. There is no increase in right ventricular wall thickness.  3. Left atrial size was mildly dilated.  4. Right atrial size was mildly dilated.  5. The mitral valve is normal in structure. No evidence of mitral valve stenosis. Trivial regurgitation.  6. The tricuspid valve is normal in structure.  7. The aortic valve is tricuspid There is mild calcification of the aortic valve. Aortic valve regurgitation is trivial by color flow Doppler. No stenosis.  8. The pulmonic valve was normal in structure.  9. There is mild dilatation of the aortic root measuring 38 mm. 10. Right atrial pressure is estimated at 3 mmHg. 11. No complete TR doppler jet so unable to estimate PA systolic pressure.   Lexiscan Myoview 10/17/15: The left ventricular ejection fraction is moderately decreased (30-44%). Nuclear stress EF: 39%. There was no ST segment deviation noted  during stress. There is a small defect of moderate severity present in the apical lateral and apex location and medium sized defect of moderate severity in the basal inferior, mild inferior and apical inferior location. These defects are fixed and consistent with scar. No ischemia noted. This is an intermediate risk study due to LV dysfunction.     Recent Labs: 01/30/2020: ALT 15 02/06/2020: Hemoglobin 11.4; Platelets 176.0; TSH 1.70 07/03/2020: BUN 15; Creatinine, Ser 1.04; Potassium 4.4; Sodium 138    Lipid Panel    Component Value Date/Time   CHOL 116 01/30/2020 0843   CHOL 121 07/29/2016 0822   TRIG 78.0 01/30/2020 0843   HDL 43.50 01/30/2020 0843   HDL 56 07/29/2016 0822   CHOLHDL 3 01/30/2020 0843   VLDL 15.6 01/30/2020 0843   LDLCALC 57 01/30/2020 0843   LDLCALC 56 07/29/2016 0822      Wt Readings from Last 3 Encounters:  09/04/20 216 lb (98 kg)  05/01/20 220 lb (99.8 kg)  03/28/20 220 lb (99.8 kg)      ASSESSMENT AND PLAN: Nonischemic cardiomyopathy ([place]) History ofLVEF has recovered.  It was 60 to 65% when checked.  He still reports exertional dyspnea and occasional PND.  Symptoms have improved since starting Lasix.  We will repeat an echocardiogram to make sure there have been no changes.  Continue metoprolol and valsartan.  HLD (hyperlipidemia) Lipids are well-controlled on atorvastatin.  LDL goal is less than 70.  Continue Zetia.  CAD S/P percutaneous coronary angioplasty He is status post LAD PCI.  He has no angina.  Continue aspirin, atorvastatin, and metoprolol.  Peripheral neuropathy This is his main complaint.  We again reviewed the fact that this is not cardiovascular in etiology.  He has  great peripheral pulses and ABIs are normal.  Clopidogrel did not cause this.  His symptoms are mostly at rest, which is not consistent with claudication.  He is very frustrated that he cannot get to the root of the problem.  I suggested that he may want to get another  opinion at an academic center, as he is concerned about pursuing a spinal cord stimulator.  Persistent atrial fibrillation ([place]) He remains in sinus rhythm.  He is status post ablation.  Continue metoprolol.  Anticoagulation was discontinued due to GI bleeding.  If he were to have recurrent atrial fibrillation, would consider a Watchman device.    Current medicines are reviewed at length with the patient today.  The patient does not have concerns regarding medicines.  The following changes have been made: none  Labs/ tests ordered today include:   Orders Placed This Encounter  Procedures   EKG 12-Lead   ECHOCARDIOGRAM COMPLETE      Disposition:   FU with Makaylen Thieme C. [name], MD, [facility] in 1 year.   I,Mathew Stumpf,acting as a Education administrator for Skeet Latch, MD.,have documented all relevant documentation on the behalf of Skeet Latch, MD,as directed by  Skeet Latch, MD while in the presence of Skeet Latch, MD.  I, [place] [name], MD have reviewed all documentation for this visit.  The documentation of the exam, diagnosis, procedures, and orders on 09/04/2020 are all accurate and complete.  Time spent: 35 minutes-Greater than 50% of this time was spent in counseling, explanation of diagnosis, planning of further management, and coordination of care.    Signed, Tremell Reimers C. [name], MD, [facility]  09/04/2020 9:10 AM    [place]

## 2020-09-04 NOTE — Assessment & Plan Note (Signed)
He is status post LAD PCI.  He has no angina.  Continue aspirin, atorvastatin, and metoprolol.

## 2020-09-04 NOTE — Assessment & Plan Note (Signed)
He remains in sinus rhythm.  He is status post ablation.  Continue metoprolol.  Anticoagulation was discontinued due to GI bleeding.  If he were to have recurrent atrial fibrillation, would consider a Watchman device.

## 2020-09-13 ENCOUNTER — Ambulatory Visit (HOSPITAL_COMMUNITY): Payer: Medicare Other | Attending: Cardiovascular Disease

## 2020-09-13 ENCOUNTER — Other Ambulatory Visit: Payer: Self-pay

## 2020-09-13 DIAGNOSIS — I5042 Chronic combined systolic (congestive) and diastolic (congestive) heart failure: Secondary | ICD-10-CM | POA: Diagnosis present

## 2020-09-13 LAB — ECHOCARDIOGRAM COMPLETE
AR max vel: 1.49 cm2
AV Area VTI: 1.49 cm2
AV Area mean vel: 1.5 cm2
AV Mean grad: 10 mmHg
AV Peak grad: 18.8 mmHg
Ao pk vel: 2.17 m/s
Area-P 1/2: 4.6 cm2
P 1/2 time: 723 msec
S' Lateral: 4.77 cm

## 2020-09-13 MED ORDER — PERFLUTREN LIPID MICROSPHERE
1.0000 mL | INTRAVENOUS | Status: AC | PRN
  Administered 2020-09-13: 3 mL via INTRAVENOUS

## 2020-09-27 ENCOUNTER — Other Ambulatory Visit (HOSPITAL_COMMUNITY): Payer: Medicare Other

## 2020-09-27 ENCOUNTER — Encounter (HOSPITAL_BASED_OUTPATIENT_CLINIC_OR_DEPARTMENT_OTHER): Payer: Self-pay

## 2020-09-27 ENCOUNTER — Telehealth: Payer: Self-pay | Admitting: Cardiovascular Disease

## 2020-09-27 ENCOUNTER — Telehealth (HOSPITAL_BASED_OUTPATIENT_CLINIC_OR_DEPARTMENT_OTHER): Payer: Self-pay | Admitting: *Deleted

## 2020-09-27 DIAGNOSIS — I5042 Chronic combined systolic (congestive) and diastolic (congestive) heart failure: Secondary | ICD-10-CM

## 2020-09-27 DIAGNOSIS — Z5181 Encounter for therapeutic drug level monitoring: Secondary | ICD-10-CM

## 2020-09-27 DIAGNOSIS — R9430 Abnormal result of cardiovascular function study, unspecified: Secondary | ICD-10-CM

## 2020-09-27 MED ORDER — ENTRESTO 97-103 MG PO TABS: 1.0000 | ORAL_TABLET | Freq: Two times a day (BID) | ORAL | 1 refills | Status: DC

## 2020-09-27 MED ORDER — DAPAGLIFLOZIN PROPANEDIOL 10 MG PO TABS: 10.0000 mg | ORAL_TABLET | Freq: Every day | ORAL | 1 refills | Status: DC

## 2020-09-27 NOTE — Telephone Encounter (Signed)
-----   Message from Skeet Latch, MD sent at 09/26/2020 11:33 PM EDT ----- Echo shows that his heart has gotten a little weaker again.  Recommend stopping valsartan and switching to Entresto 97/103 bid.  Also add Farxiga '10mg'$  daily.  Check BMP in a week.  This should help him to feel better and improve his pumping function and make him feel better.  Repeat echo in 3 months and follow up after.

## 2020-09-27 NOTE — Telephone Encounter (Signed)
Tried to call patient, left message Released  mychart with message to call if any questions

## 2020-09-27 NOTE — Telephone Encounter (Signed)
Spoke with patient, reviewed Echo information and scheduled follow up

## 2020-09-27 NOTE — Telephone Encounter (Signed)
Follow Up:     Pt he received your message in My Chart, he says he have some questions.

## 2020-09-28 NOTE — Telephone Encounter (Signed)
Patient seen in office 7/26

## 2020-10-08 ENCOUNTER — Encounter: Payer: Self-pay | Admitting: Family Medicine

## 2020-10-08 DIAGNOSIS — Z0000 Encounter for general adult medical examination without abnormal findings: Secondary | ICD-10-CM | POA: Insufficient documentation

## 2020-10-08 LAB — BASIC METABOLIC PANEL
BUN/Creatinine Ratio: 17 (ref 10–24)
BUN: 18 mg/dL (ref 8–27)
CO2: 24 mmol/L (ref 20–29)
Calcium: 9.6 mg/dL (ref 8.6–10.2)
Chloride: 100 mmol/L (ref 96–106)
Creatinine, Ser: 1.07 mg/dL (ref 0.76–1.27)
Glucose: 103 mg/dL — ABNORMAL HIGH (ref 65–99)
Potassium: 4.8 mmol/L (ref 3.5–5.2)
Sodium: 138 mmol/L (ref 134–144)
eGFR: 68 mL/min/{1.73_m2} (ref >59)

## 2020-10-09 ENCOUNTER — Encounter (HOSPITAL_BASED_OUTPATIENT_CLINIC_OR_DEPARTMENT_OTHER): Payer: Self-pay

## 2020-10-29 ENCOUNTER — Encounter: Payer: Self-pay | Admitting: Cardiology

## 2020-10-29 ENCOUNTER — Ambulatory Visit (INDEPENDENT_AMBULATORY_CARE_PROVIDER_SITE_OTHER): Payer: Medicare Other | Admitting: Cardiology

## 2020-10-29 ENCOUNTER — Other Ambulatory Visit: Payer: Self-pay

## 2020-10-29 VITALS — BP 112/68 | HR 64 | Ht 71.0 in | Wt 214.4 lb

## 2020-10-29 DIAGNOSIS — I4819 Other persistent atrial fibrillation: Secondary | ICD-10-CM | POA: Diagnosis not present

## 2020-10-29 MED ORDER — METOPROLOL SUCCINATE ER 100 MG PO TB24: 100.0000 mg | ORAL_TABLET | Freq: Every day | ORAL | 3 refills | Status: DC

## 2020-10-29 NOTE — Progress Notes (Signed)
Electrophysiology Office Note   Date:  10/29/2020   ID:  Theodore [name], DOB 1934/07/24, MRN [id number]  PCP:  [name], MD  Cardiologist:  Orlinda Blalock Electrophysiologist:  Dr [name]    CC: Follow up for persistent atrial fibrillation   History of Present Illness: Theodore [name] is a 86 y.o. male who presents today for electrophysiology evaluation.     He has a history significant for hypertension, hyperlipidemia, persistent atrial fibrillation fever, and coronary artery disease.  He is status post A. fib ablation 12/19/2015.  Left heart catheterization also showed coronary artery disease and he has a DES to the LAD.  Unfortunately he has since developed heart failure.  He is currently on optimal medical therapy.  Today, denies symptoms of palpitations, chest pain, shortness of breath, orthopnea, PND, lower extremity edema, claudication, dizziness, presyncope, syncope, bleeding, or neurologic sequela. The patient is tolerating medications without difficulties.  Since being seen he has noted no further episodes of atrial fibrillation.  He is overall able to do all of his daily activities, only limited by peripheral neuropathy.  He is potentially planning on getting a spinal cord stimulator.  He is concerned that his neuropathy may be due to his statin.  He would like to hold his statin for the next 30 days to see if things improve.  He also states that his blood pressure at home is getting down into the 80s at times.  His metoprolol was recently increased.  He would like go back to prior dose.  Past Medical History:  Diagnosis Date   CAP (community acquired pneumonia) 01/19/2018   History of arthritis    History of rheumatic fever    Hypercholesterolemia    Hypertension    Nocturia    PAF (paroxysmal atrial fibrillation) [facility]) January 2013   Placed on Pradaxa. Did not require cardioversion; spontaneously converted   Peripheral edema    Persistent atrial fibrillation  ([place]) 01/21/2011   S/p ablation 12/2015 NSR up until his PCI Dec 2020 when he went into AF post PCI   Right bundle branch block    SOB (shortness of breath)    Tendonitis of elbow, left    Past Surgical History:  Procedure Laterality Date   ATRIAL FIBRILLATION ABLATION  12/19/2015   BACK SURGERY  12/24/09   fusion C3-C4   BACK SURGERY  2010   fusion L4-L5 [facility])   CARDIAC CATHETERIZATION  2009   NONOBSTRUCTIVE ATHERSCLEROTIC CORONARY DISEASE AND NORMAL  LV FUNCTION   CARDIAC CATHETERIZATION N/A 09/08/2014   Procedure: Left Heart Cath and Coronary Angiography;  Surgeon: Belva Crome, MD;  Location: [place] [facility];  Service: Cardiovascular;  Laterality: N/A;   CARDIAC CATHETERIZATION N/A 09/28/2015   Procedure: Left Heart Cath and Coronary Angiography;  Surgeon: Leonie Man, MD;  Location: [place] [facility];  Service: Cardiovascular;  Laterality: N/A;   CARDIAC CATHETERIZATION N/A 09/28/2015   Procedure: Intravascular Pressure Wire/FFR Study;  Surgeon: Leonie Man, MD;  Location: [place] [facility];  Service: Cardiovascular;  Laterality: N/A;   CARDIOVERSION N/A 10/23/2015   Procedure: CARDIOVERSION;  Surgeon: [name], MD;  Location: [facility] ENDOSCOPY;  Service: Cardiovascular;  Laterality: N/A;   COLONOSCOPY  2013   per patient, rpt 5 yrs   COLONOSCOPY WITH PROPOFOL N/A 12/02/2018   TAx2, HP, diverticulosis [name], MD)   CORONARY ATHERECTOMY N/A 01/11/2019   Procedure: CORONARY ATHERECTOMY;  Surgeon: Burnell [name], MD;  Location: [place] [facility]  [facility];  Service: Cardiovascular;  Laterality: N/A;   CORONARY STENT INTERVENTION N/A 01/11/2019   Procedure: CORONARY STENT INTERVENTION;  Surgeon: Burnell [name], MD;  Location: [place] [facility];  Service: Cardiovascular;  Laterality: N/A;   ELECTROPHYSIOLOGIC STUDY N/A 12/19/2015   Procedure: Atrial Fibrillation Ablation;  Surgeon: Jaison Petraglia Meredith Leeds, MD;  Location: [place] [facility];  Service:  Cardiovascular;  Laterality: N/A;   ESOPHAGOGASTRODUODENOSCOPY (EGD) WITH PROPOFOL N/A 12/02/2018   medium HH [name], MD)   INTRAVASCULAR PRESSURE WIRE/FFR STUDY N/A 01/10/2019   Procedure: INTRAVASCULAR PRESSURE WIRE/FFR STUDY;  Surgeon: Leonie Man, MD;  Location: [place] [facility];  Service: Cardiovascular;  Laterality: N/A;   KNEE ARTHROSCOPY Left 03/2016   Dr. [name]   LEFT HEART CATH AND CORONARY ANGIOGRAPHY N/A 01/10/2019   Procedure: LEFT HEART CATH AND CORONARY ANGIOGRAPHY;  Surgeon: Leonie Man, MD;  Location: [place] [facility];  Service: Cardiovascular;  Laterality: N/A;   PATELLAR TENDON REPAIR Left 2008   POLYPECTOMY  12/02/2018   Procedure: POLYPECTOMY;  Surgeon: [name], MD;  Location: [facility] ENDOSCOPY;  Service: Endoscopy;;   REPLACEMENT TOTAL KNEE Right 2006   TOTAL KNEE ARTHROPLASTY Left 02/09/2017   Procedure: LEFT TOTAL KNEE ARTHROPLASTY, EXCISION LEFT DISTAL THIGH MASS;  Surgeon: [name], MD;  Location: [facility] ORS;  Service: Orthopedics;  Laterality: Left;  90 mins   TRICEPS TENDON REPAIR Left 2013     Current Outpatient Medications  Medication Sig Dispense Refill   acetaminophen (TYLENOL) 500 MG tablet Take 1 tablet (500 mg total) by mouth 3 (three) times daily as needed. 30 tablet 0   amoxicillin (AMOXIL) 500 MG tablet amoxicillin 500 mg tablet  TAKE 4 TABLETS BY MOUTH 1 HOUR PRIOR TO DENTAL APPOINTMENT     Ascorbic Acid (VITAMIN C PO) Take 500 mg by mouth daily.     aspirin EC 81 MG tablet Take 1 tablet (81 mg total) by mouth daily. Swallow whole. 90 tablet 3   atorvastatin (LIPITOR) 40 MG tablet TAKE 1 TABLET BY MOUTH  DAILY 90 tablet 3   Budeson-Glycopyrrol-Formoterol (BREZTRI AEROSPHERE) 160-9-4.8 MCG/ACT AERO Inhale 2 puffs into the lungs 2 (two) times daily. 32.1 g 3   Cholecalciferol (VITAMIN D3) 2000 units TABS Take 2,000 Units by mouth daily.     dapagliflozin propanediol (FARXIGA) 10 MG TABS tablet Take 1 tablet (10 mg total) by  mouth daily before breakfast. 90 tablet 1   ezetimibe (ZETIA) 10 MG tablet Take 1 tablet (10 mg total) by mouth daily. 90 tablet 3   furosemide (LASIX) 40 MG tablet Take 1 tablet (40 mg total) by mouth daily. 90 tablet 3   gabapentin (NEURONTIN) 300 MG capsule Take 900 mg by mouth daily.     metoprolol succinate (TOPROL-XL) 100 MG 24 hr tablet Take 1 tablet (100 mg total) by mouth daily. Take with or immediately following a meal. 90 tablet 3   nitroGLYCERIN (NITROSTAT) 0.4 MG SL tablet Place 1 tablet (0.4 mg total) under the tongue every 5 (five) minutes as needed. 25 tablet 2   pantoprazole (PROTONIX) 40 MG tablet TAKE 1 TABLET BY MOUTH  DAILY 90 tablet 3   potassium chloride (KLOR-CON) 10 MEQ tablet TAKE 1 TABLET BY MOUTH  TWICE DAILY 180 tablet 3   sacubitril-valsartan (ENTRESTO) 97-103 MG Take 1 tablet by mouth 2 (two) times daily. 180 tablet 1   Spacer/Aero-Holding Chambers (AEROCHAMBER MV) inhaler Use as instructed 1 each 0   traZODone (DESYREL) 50 MG tablet  TAKE 0.5-1 TABLETS BY MOUTH AT BEDTIME AS NEEDED FOR SLEEP. 90 tablet 2   vitamin B-12 (CYANOCOBALAMIN) 1000 MCG tablet Take 1 tablet (1,000 mcg total) by mouth daily.     No current facility-administered medications for this visit.    Allergies:   Penicillins, Amlodipine, Effexor [venlafaxine], Lisinopril, Mevacor [lovastatin], Plavix [clopidogrel], Vicodin [hydrocodone-acetaminophen], Zocor [simvastatin], and Oseltamivir phosphate   Social History:  The patient  reports that he has never smoked. He has never used smokeless tobacco. He reports current alcohol use of about 14.0 standard drinks per week. He reports that he does not use drugs.   Family History:  The patient's family history includes Asthma in his sister; COPD in his brother and father; Heart attack in his father; Heart disease in his brother; Hypertension in his father.   ROS:  Please see the history of present illness.   Otherwise, review of systems is positive for  none.   All other systems are reviewed and negative.   PHYSICAL EXAM: VS:  BP 112/68   Pulse 64   Ht '5\' 11"'$  (1.803 m)   Wt 214 lb 6.4 oz (97.3 kg)   SpO2 98%   BMI 29.90 kg/m  , BMI Body mass index is 29.9 kg/m. GEN: Well nourished, well developed, in no acute distress  HEENT: normal  Neck: no JVD, carotid bruits, or masses Cardiac: RRR; no murmurs, rubs, or gallops,no edema  Respiratory:  clear to auscultation bilaterally, normal work of breathing GI: soft, nontender, nondistended, + BS MS: no deformity or atrophy  Skin: warm and dry Neuro:  Strength and sensation are intact Psych: euthymic mood, full affect  EKG:  EKG is ordered today. Personal review of the ekg ordered shows sinus rhythm, rate 64, PVCs   Recent Labs: 01/30/2020: ALT 15 02/06/2020: Hemoglobin 11.4; Platelets 176.0; TSH 1.70 10/08/2020: BUN 18; Creatinine, Ser 1.07; Potassium 4.8; Sodium 138    Lipid Panel     Component Value Date/Time   CHOL 116 01/30/2020 0843   CHOL 121 07/29/2016 0822   TRIG 78.0 01/30/2020 0843   HDL 43.50 01/30/2020 0843   HDL 56 07/29/2016 0822   CHOLHDL 3 01/30/2020 0843   VLDL 15.6 01/30/2020 0843   LDLCALC 57 01/30/2020 0843   LDLCALC 56 07/29/2016 0822     Wt Readings from Last 3 Encounters:  10/29/20 214 lb 6.4 oz (97.3 kg)  09/04/20 216 lb (98 kg)  05/01/20 220 lb (99.8 kg)      Other studies Reviewed: Additional studies/ records that were reviewed today include: Cardiac monitor 07/26/2019 personally reviewed Review of the above records today demonstrates:  Predominant rhythm: sinus rhythm Average heart rate: 69 bpm Max heart rate: 113 bpm Min heart rate: 52 bpm  PACs, PVCs and ventricular bigeminy.  TTE 09/13/20  1. Left ventricular ejection fraction, by estimation, is 45 to 50%. The  left ventricle has mildly decreased function. The left ventricle  demonstrates global hypokinesis. The left ventricular internal cavity size  was moderately dilated. There is  mild  eccentric left ventricular hypertrophy. Left ventricular diastolic  parameters are consistent with Grade II diastolic dysfunction  (pseudonormalization). Elevated left atrial pressure.   2. Right ventricular systolic function is mildly reduced. The right  ventricular size is mildly enlarged.   3. Left atrial size was severely dilated.   4. Right atrial size was moderately dilated.   5. The mitral valve is normal in structure. Mild mitral valve  regurgitation.   6. The aortic valve is  tricuspid. There is mild calcification of the  aortic valve. There is moderate thickening of the aortic valve. Aortic  valve regurgitation is mild. Mild aortic valve stenosis.   ASSESSMENT AND PLAN:  1.  Persistent atrial fibrillation status post ablation 12/19/2015.  Currently on Toprol-XL 150 mg daily.  CHA2DS2-VASc of 5.  His Xarelto was stopped at his last visit as he had had no further episodes of atrial fibrillation.  2.  Coronary artery disease: Status post LAD stent.  No current chest pain.  Currently on aspirin 81 mg.  3.  Chronic systolic heart failure currently on Entresto 97/103 mg twice daily, Toprol-XL 150 mg daily.  No obvious volume overload.  4.  Hypertension: Currently well controlled.  He does state that his blood pressure is low at home and he feels weak and fatigued.  We Theodore [name] reduce his Toprol-XL to 100 mg daily.  5.  Hyperlipidemia: Continues on atorvastatin 40 mg per primary cardiology.  He is having quite a bit of peripheral neuropathy.  He is concerned that his statin could be causing this.  He Theodore [name] hold it for the next 30 days to see if things improve.  If not, he Theodore [name] restart the medication.  6.  Ectatic abdominal aorta: Has been followed by ultrasounds.  Case discussed with primary cardiology  Current medicines are reviewed at length with the patient today.   The patient does not have concerns regarding his medicines.  The following changes were made today: Reduce  Toprol-XL  Labs/ tests ordered today include:  Orders Placed This Encounter  Procedures   EKG 12-Lead      Disposition:   FU with Cavin Longman 12 months  Signed, Emanuela Runnion Meredith Leeds, MD  10/29/2020 10:43 AM     [address] [place] [address] [place] [address] [phone] (office) [phone] (fax)

## 2020-10-30 ENCOUNTER — Other Ambulatory Visit: Payer: Self-pay | Admitting: *Deleted

## 2020-10-30 MED ORDER — METOPROLOL SUCCINATE ER 100 MG PO TB24: 100.0000 mg | ORAL_TABLET | Freq: Every day | ORAL | 3 refills | Status: DC

## 2020-11-27 ENCOUNTER — Telehealth: Payer: Self-pay | Admitting: Cardiovascular Disease

## 2020-11-27 NOTE — Telephone Encounter (Signed)
Pt is requesting a refill on protonix 40mg . Kerin Ransom would normally refill this for him. Do you want to refill this medication for pt?  Please advise. Thanks

## 2020-11-27 NOTE — Telephone Encounter (Signed)
*  STAT* If patient is at the pharmacy, call can be transferred to refill team.   1. Which medications need to be refilled? (please list name of each medication and dose if known) pantoprazole (PROTONIX) 40 MG tablet  2. Which pharmacy/location (including street and city if local pharmacy) is medication to be sent to?  [facility]  ([place], [place] Phone:  [phone]         3. Do they need a 30 day or 90 day supply? 90 day

## 2020-11-28 ENCOUNTER — Other Ambulatory Visit: Payer: Self-pay

## 2020-11-28 ENCOUNTER — Other Ambulatory Visit: Payer: Self-pay | Admitting: Family Medicine

## 2020-11-28 MED ORDER — PANTOPRAZOLE SODIUM 40 MG PO TBEC: 40.0000 mg | DELAYED_RELEASE_TABLET | Freq: Every day | ORAL | 3 refills | Status: DC

## 2020-12-05 ENCOUNTER — Other Ambulatory Visit: Payer: Self-pay | Admitting: *Deleted

## 2020-12-19 NOTE — Progress Notes (Signed)
Subjective:    Patient ID: Theodore [name], male    DOB: 1934/05/16, 86 y.o.   MRN: [id number] HPI male never smoker followed for COPD/bronchitis, complicated by HBP, peripheral edema, GERD, A. Fib/ ablation, CAD, PE/DVT,  PFT 2011 FEV1/FVC 0.65.    PFT 11/30/13- moderate obstruction, insignificant response to bronchodilator, airtrapping, minimal diffusion defect. FEV1 2.21/ 70%, FEV1/FVC 0.63, TLC 97%, DLCO 78%. EKG 03/19/2018-sinus bradycardia with frequent multifocal PVCs/ventricular trigeminy PFT 10/15/2018- Moderate obstruction with response to BD, Airtrapping, Normal Diffusion (Chronic bronchitis pattern) -----------------------------------------------------------------------------------------   12/14/19- 86 year old male never smoker followed for COPD/bronchitis, complicated by HBP, peripheral edema, GERD, A. Fib/ Xarelto, PE/DVT (2019), CAD/ stent, Peripheral Neuropathy,  Spiriva, Xarelto/ Plavix, Symbicort 160/ spacer,  Covid vax- 3 Phizer Flu vax- had Echo EF 60-65% Denies recent acute issues or changes. He preferred samples Breztri to [employer] and that script was sent.  Peripheral neuropathy- feet burn at night. CXR 06/13/19-  IMPRESSION: No active cardiopulmonary disease.  12/20/20-  86 year old male never smoker followed for COPD/bronchitis, complicated by HBP, peripheral edema, GERD, A. Fib/ Xarelto, PE/DVT (2019), CAD/ stent, Peripheral Neuropathy,  -Spiriva, Xarelto/ Plavix, Breztri/ spacer Covid vax- 4 Phizer Flu vax- had -----Patient needs refills on Breztri. Productive cough with beige sputum. States he has a pain in his back that goes to the front sometimes.  Has avoided Covid infection. He reports mostly constant mild R subscapular pain seeming to radiate through to front under costal margin, present x couple of months, not progressive. He doesn't relate it to known lumbar disc disease for which he had injections hoping to relieve peripheral neuropathy. Cough is  stable, chronic with some white/ darker sputum. Judithann Sauger has helped breathing better than other inhalers.  ROS-see HPI + = positive Constitutional:   No-   weight loss, night sweats, fevers, chills, fatigue, lassitude. HEENT:   No-  headaches, difficulty swallowing, tooth/dental problems, sore throat,       No-  sneezing, itching, ear ache, nasal congestion, post nasal drip,  CV:  No-   chest pain, orthopnea, PND, swelling in lower extremities, anasarca, dizziness, +palpitations Resp: + shortness of breath with exertion or at rest.              +  productive cough,  non-productive cough,  No- coughing up of blood.              No-   change in color of mucus.  +wheezing.   Skin: No-   rash or lesions. GI:  No-   heartburn, indigestion, abdominal pain, nausea, vomiting,  GU:  MS:  No-   joint pain or swelling. + back pain Neuro-   + feet burn Psych:  No- change in mood or affect. No depression or anxiety.  No memory loss.  OBJ- Physical Exam General- Alert, Oriented, Affect-appropriate, Distress- none acute, looks well Skin- +ecchymoses on arms  Lymphadenopathy- none Head- atraumatic            Eyes- Gross vision intact, PERRLA, conjunctivae and secretions clear            Ears- Hearing, canals-normal            Nose- Clear, no-Septal dev, mucus, polyps, erosion, perforation             Throat- Mallampati II , mucosa clear , drainage- none, tonsils- atrophic,  Neck- flexible , trachea midline, no stridor , thyroid nl, carotid no bruit Chest - symmetrical excursion , unlabored  Heart/CV- RRR , no murmur , no gallop  , no rub, nl s1 s2                           - JVD- none , edema- none, stasis changes- none, varices- none           Lung-  + raspy / bronchitic sounds on expiration, wheeze- none, cough- none here, dullness-none, rub- none           Chest wall-  +R subscapular area not tender Abd-  +RUQ and R lower costal margin not tender with no palpable liver enlargement. Br/  Gen/ Rectal- Not done, not indicated Extrem- cyanosis- none, clubbing, none, atrophy- none, strength- nl Neuro- grossly intact to observation

## 2020-12-20 ENCOUNTER — Ambulatory Visit (INDEPENDENT_AMBULATORY_CARE_PROVIDER_SITE_OTHER): Payer: Medicare Other

## 2020-12-20 ENCOUNTER — Encounter: Payer: Self-pay | Admitting: Internal Medicine

## 2020-12-20 ENCOUNTER — Ambulatory Visit (INDEPENDENT_AMBULATORY_CARE_PROVIDER_SITE_OTHER): Payer: Medicare Other | Admitting: Internal Medicine

## 2020-12-20 ENCOUNTER — Other Ambulatory Visit: Payer: Self-pay

## 2020-12-20 VITALS — BP 110/60 | HR 67 | Temp 98.0°F | Ht 71.0 in | Wt 212.0 lb

## 2020-12-20 DIAGNOSIS — G8929 Other chronic pain: Secondary | ICD-10-CM

## 2020-12-20 DIAGNOSIS — M549 Dorsalgia, unspecified: Secondary | ICD-10-CM | POA: Insufficient documentation

## 2020-12-20 DIAGNOSIS — M546 Pain in thoracic spine: Secondary | ICD-10-CM | POA: Diagnosis not present

## 2020-12-20 DIAGNOSIS — J449 Chronic obstructive pulmonary disease, unspecified: Secondary | ICD-10-CM

## 2020-12-20 MED ORDER — IPRATROPIUM-ALBUTEROL 0.5-2.5 (3) MG/3ML IN SOLN: 3.0000 mL | Freq: Four times a day (QID) | RESPIRATORY_TRACT | 12 refills | Status: DC | PRN

## 2020-12-20 MED ORDER — BREZTRI AEROSPHERE 160-9-4.8 MCG/ACT IN AERO: 2.0000 | INHALATION_SPRAY | Freq: Two times a day (BID) | RESPIRATORY_TRACT | 3 refills | Status: DC

## 2020-12-20 MED ORDER — DOXYCYCLINE HYCLATE 100 MG PO TABS: 100.0000 mg | ORAL_TABLET | Freq: Two times a day (BID) | ORAL | 0 refills | Status: DC

## 2020-12-20 NOTE — Patient Instructions (Signed)
Order- CXR dx COPD exacerbation  Script sent refilling Breztri  Script sent for doxycycline antibiotic for bronchitis  Order- new DME, new compressor nebulizer         DX COPD mixed type                              Duonenb    # 75 ml, ref x 12

## 2020-12-20 NOTE — Assessment & Plan Note (Signed)
Chronic bronchitis Plan- doxycycline, add nebulizer/ Duoneb to have at home, refill [place], CXR

## 2020-12-20 NOTE — Assessment & Plan Note (Signed)
Suspect musculoskeletal, given known arthritic lumbar problems, but need to R/O other. Plan- CXR. If persistent, consider CT.

## 2020-12-24 ENCOUNTER — Other Ambulatory Visit: Payer: Self-pay

## 2020-12-24 ENCOUNTER — Ambulatory Visit (HOSPITAL_COMMUNITY): Payer: Medicare Other | Attending: Internal Medicine

## 2020-12-24 DIAGNOSIS — I5042 Chronic combined systolic (congestive) and diastolic (congestive) heart failure: Secondary | ICD-10-CM | POA: Insufficient documentation

## 2020-12-24 DIAGNOSIS — R9430 Abnormal result of cardiovascular function study, unspecified: Secondary | ICD-10-CM | POA: Insufficient documentation

## 2020-12-24 LAB — ECHOCARDIOGRAM COMPLETE
Area-P 1/2: 2.21 cm2
P 1/2 time: 964 msec
S' Lateral: 3.6 cm

## 2020-12-26 ENCOUNTER — Other Ambulatory Visit: Payer: Self-pay

## 2020-12-26 ENCOUNTER — Other Ambulatory Visit (HOSPITAL_BASED_OUTPATIENT_CLINIC_OR_DEPARTMENT_OTHER): Payer: Self-pay | Admitting: *Deleted

## 2020-12-26 ENCOUNTER — Ambulatory Visit (INDEPENDENT_AMBULATORY_CARE_PROVIDER_SITE_OTHER): Payer: Medicare Other | Admitting: Cardiovascular Disease

## 2020-12-26 ENCOUNTER — Encounter (HOSPITAL_BASED_OUTPATIENT_CLINIC_OR_DEPARTMENT_OTHER): Payer: Self-pay | Admitting: Cardiovascular Disease

## 2020-12-26 VITALS — BP 130/82 | HR 75 | Ht 71.0 in | Wt 212.0 lb

## 2020-12-26 DIAGNOSIS — I493 Ventricular premature depolarization: Secondary | ICD-10-CM | POA: Diagnosis not present

## 2020-12-26 DIAGNOSIS — I428 Other cardiomyopathies: Secondary | ICD-10-CM | POA: Diagnosis not present

## 2020-12-26 DIAGNOSIS — E7800 Pure hypercholesterolemia, unspecified: Secondary | ICD-10-CM

## 2020-12-26 DIAGNOSIS — I10 Essential (primary) hypertension: Secondary | ICD-10-CM

## 2020-12-26 DIAGNOSIS — Z9861 Coronary angioplasty status: Secondary | ICD-10-CM

## 2020-12-26 DIAGNOSIS — I2510 Atherosclerotic heart disease of native coronary artery without angina pectoris: Secondary | ICD-10-CM

## 2020-12-26 MED ORDER — PANTOPRAZOLE SODIUM 40 MG PO TBEC: 40.0000 mg | DELAYED_RELEASE_TABLET | Freq: Every day | ORAL | 3 refills | Status: DC

## 2020-12-26 MED ORDER — ENTRESTO 97-103 MG PO TABS: 1.0000 | ORAL_TABLET | Freq: Two times a day (BID) | ORAL | 3 refills | Status: DC

## 2020-12-26 MED ORDER — DAPAGLIFLOZIN PROPANEDIOL 10 MG PO TABS: 10.0000 mg | ORAL_TABLET | Freq: Every day | ORAL | 3 refills | Status: DC

## 2020-12-26 NOTE — Patient Instructions (Addendum)
Medication Instructions:  STOP- Atorvastatin(Lipitor)  *If you need a refill on your cardiac medications before your next appointment, please call your pharmacy*  Lab Work: CMP and Fasting Lipids  Testing/Procedures: None Ordered  Follow-Up: At [facility], you and your health needs are our priority.  As part of our continuing mission to provide you with exceptional heart care, we have created designated Provider Care Teams.  These Care Teams include your primary Cardiologist (physician) and Advanced Practice Providers (APPs -  Physician Assistants and Nurse Practitioners) who all work together to provide you with the care you need, when you need it.  We recommend signing up for the patient portal called "MyChart".  Sign up information is provided on this After Visit Summary.  MyChart is used to connect with patients for Virtual Visits (Telemedicine).  Patients are able to view lab/test results, encounter notes, upcoming appointments, etc.  Non-urgent messages can be sent to your provider as well.   To learn more about what you can do with MyChart, go to [email or web address].    Your next appointment:   6 month(s)  The format for your next appointment:   In Person  Provider:   Skeet Latch, MD   Other Instructions Your physician recommends that you schedule a follow-up appointment in: PharmD for PCSK9

## 2020-12-26 NOTE — Assessment & Plan Note (Signed)
Blood pressure well-controlled on Entresto and metoprolol.

## 2020-12-26 NOTE — Assessment & Plan Note (Signed)
Chronic systolic diastolic heart failure.  LVEF was 45 to 50% but improved to 55 to 60% since starting [place].  He is euvolemic on exam and doing well.  Check a CMP today.

## 2020-12-26 NOTE — Assessment & Plan Note (Signed)
Stable on metoprolol. °

## 2020-12-26 NOTE — Assessment & Plan Note (Addendum)
LDL goal is less than 70 given prior PCI.  His peripheral neuropathy seems to be doing better since he stopped atorvastatin.  We will continue Zetia.  We will have him see the pharmacist to consider a PCSK9 inhibitor.  He has been on 2 other statins in the past and has not tolerated them.  Check fasting lipids and CMP today as a baseline.

## 2020-12-26 NOTE — Progress Notes (Signed)
Cardiology Office Note   Date:  12/26/2020   ID:  Theodore [name], DOB 1934-10-22, MRN [id number]  PCP:  [name], MD  Cardiologist:  Skeet Latch, MD  Electrophysiologist:  Constance Haw, MD   Evaluation Performed:  Follow-Up Visit  Chief Complaint:  Atrial fibrillation, neuropathy  History of Present Illness:     Theodore [name] is a 86 y.o. male with CAD s/p LAD PCI, chronic diastolic heart failure, hypertension, hyperlipidemia, persistent atrial fibrillation s/p ablation, asthma and Rheumatic fever who presents for follow up.  Theodore [name] was previously a patient of Dr. Mare Ferrari.  Theodore [name] was diagnosed with atrial fibrillation in 2014.  He was initially on Pradaxa and switched to Xarelto.  He underwent ablation on 12/19/15 with Dr. [name] and has remained in sinus rhythm.  He had an episode of chest pain 08/2014 and had a Lexiscan Myoview 08/31/14 that showed an old inferior scar with peri-infarct ischemia and LVEF 49%.  He then had a LHC that revealed 70% ostial LAD, 50% prox LAD, 30% OM1 and 35% RCA.  He was managed medically.  He continued to report exertional symptoms despite medical management. He had a LHC with Dr. Ellyn Hack on 09/28/15 that again revealed a 70% ostial-proximal LAD lesion.  Dr. Ellyn Hack performed FFR on that lesion and there was no significant change.  The left ventriculogram performed at that time revealed LVEF 35-45%.  This subsequently improved to 50% on 03/2018.  He again underwent left heart cath on 01/10/2019 and there was progression of his LAD lesion.  He underwent atherectomy and PCI with drug-eluting stent placement 01/11/2019.  Since then he had noted an improvement in his breathing.  Atorvastatin was increased to 80 mg but he noted leg pain.  He was seen in the lipid clinic and started back on atorvastatin $RemoveBeforeD'40mg'[id number]$  and zetia 10 mg.    Theodore [name] underwent L knee replacement on  02/09/17.  Xarelto was held for 2 days prior to surgery and resumed  post-operatively.  He developed bleeding at the incision site so Xarelto was again held and he was switched to aspirin.  He subsequently developed a left lower extremity DVT and pulmonary embolism.  Xarelto was resumed. He had been struggling with peripheral neuropathy.  It started a couple weeks after the cath and he thought it was related to Plavix.  He saw Dr. [name] and switched from clopidogrel and and Xarelto to ticagrelor and Eliquis.  His neuropathy did not improve.  He followed up with his PCP and was started on gabapentin and nortriptyline. Theodore [name] complained of recurrent atrial fibrillation.  He wore a 14-day event monitor 07/2019 that showed some PACs, PVCs, and ventricular bigeminy but no atrial fibrillation.   At his last appointment Theodore [name] continued to suffer from symmetric burning sensations in his feet as well as shortness of breath with minimal exertion.  His LE edema was improving on 40 mg Lasix. He had reported one episode of internal bleeding, so his blood thinner was discontinued and 81 mg aspirin was started. He followed up with his neurologist Dr. Manuella Ghazi, who recommended an implant procedure in his lower back. Prior steroidal injections for nerve impingement were not helping. Given his persistent shortness of breath, he had an Echo 09/2020 that showed his LVEF had reduced to 55 to 60% with global hypokinesis.  He had grade 2 diastolic dysfunction.  He was started on [place].  Since that time he has been feeling much better.  He had a repeat echo 12/2020 with LVEF 55-60% and indeterminate diastolic function. Right atrial pressure was 3 mmHG. He saw Dr. [name] 10/2020 and decided to hold his statin for 30 days to see if this would help his neuropathy.   Today, he is feeling pretty good. He believes his new medications Wilder Glade and Delene Loll are helping to improve his breathing. In the first week, he lost about 5 lbs of fluid. However, his main concern is his neuropathy. He  continues to suffer from burning sensations in his feet. Since holding his statin, he noted some improvement with his neuropathy. He then restarted Lipitor 6 weeks ago after 30 days passed. He would like to remain off of Lipitor. Normally he plays golf often, but has not lately due to the weather. He denies any palpitations, or chest pain. No lightheadedness, headaches, syncope, orthopnea, or PND.   Past Medical History:  Diagnosis Date   CAP (community acquired pneumonia) 01/19/2018   History of arthritis    History of rheumatic fever    Hypercholesterolemia    Hypertension    Nocturia    PAF (paroxysmal atrial fibrillation) [facility]) January 2013   Placed on Pradaxa. Did not require cardioversion; spontaneously converted   Peripheral edema    Persistent atrial fibrillation ([place]) 01/21/2011   S/p ablation 12/2015 NSR up until his PCI Dec 2020 when he went into AF post PCI   Right bundle branch block    SOB (shortness of breath)    Tendonitis of elbow, left     Past Surgical History:  Procedure Laterality Date   ATRIAL FIBRILLATION ABLATION  12/19/2015   BACK SURGERY  12/24/09   fusion C3-C4   BACK SURGERY  2010   fusion L4-L5 [facility])   CARDIAC CATHETERIZATION  2009   NONOBSTRUCTIVE ATHERSCLEROTIC CORONARY DISEASE AND NORMAL  LV FUNCTION   CARDIAC CATHETERIZATION N/A 09/08/2014   Procedure: Left Heart Cath and Coronary Angiography;  Surgeon: Belva Crome, MD;  Location: [place] [facility];  Service: Cardiovascular;  Laterality: N/A;   CARDIAC CATHETERIZATION N/A 09/28/2015   Procedure: Left Heart Cath and Coronary Angiography;  Surgeon: Leonie Man, MD;  Location: [place] [facility];  Service: Cardiovascular;  Laterality: N/A;   CARDIAC CATHETERIZATION N/A 09/28/2015   Procedure: Intravascular Pressure Wire/FFR Study;  Surgeon: Leonie Man, MD;  Location: [place] [facility];  Service: Cardiovascular;  Laterality: N/A;   CARDIOVERSION N/A 10/23/2015   Procedure: CARDIOVERSION;   Surgeon: [name], MD;  Location: [facility] ENDOSCOPY;  Service: Cardiovascular;  Laterality: N/A;   COLONOSCOPY  2013   per patient, rpt 5 yrs   COLONOSCOPY WITH PROPOFOL N/A 12/02/2018   TAx2, HP, diverticulosis [name], MD)   CORONARY ATHERECTOMY N/A 01/11/2019   Procedure: CORONARY ATHERECTOMY;  Surgeon: Burnell [name], MD;  Location: [place] [facility];  Service: Cardiovascular;  Laterality: N/A;   CORONARY STENT INTERVENTION N/A 01/11/2019   Procedure: CORONARY STENT INTERVENTION;  Surgeon: Burnell [name], MD;  Location: [place] [facility];  Service: Cardiovascular;  Laterality: N/A;   ELECTROPHYSIOLOGIC STUDY N/A 12/19/2015   Procedure: Atrial Fibrillation Ablation;  Surgeon: [name], MD;  Location: [place] [facility];  Service: Cardiovascular;  Laterality: N/A;   ESOPHAGOGASTRODUODENOSCOPY (EGD) WITH PROPOFOL N/A 12/02/2018   medium HH [name], MD)   INTRAVASCULAR PRESSURE WIRE/FFR STUDY N/A 01/10/2019   Procedure: INTRAVASCULAR PRESSURE WIRE/FFR STUDY;  Surgeon: Leonie Man, MD;  Location: [place] [facility];  Service:  Cardiovascular;  Laterality: N/A;   KNEE ARTHROSCOPY Left 03/2016   Dr. [name]   LEFT HEART CATH AND CORONARY ANGIOGRAPHY N/A 01/10/2019   Procedure: LEFT HEART CATH AND CORONARY ANGIOGRAPHY;  Surgeon: Leonie Man, MD;  Location: [place] [facility];  Service: Cardiovascular;  Laterality: N/A;   PATELLAR TENDON REPAIR Left 2008   POLYPECTOMY  12/02/2018   Procedure: POLYPECTOMY;  Surgeon: [name], MD;  Location: [facility] ENDOSCOPY;  Service: Endoscopy;;   REPLACEMENT TOTAL KNEE Right 2006   TOTAL KNEE ARTHROPLASTY Left 02/09/2017   Procedure: LEFT TOTAL KNEE ARTHROPLASTY, EXCISION LEFT DISTAL THIGH MASS;  Surgeon: [name], MD;  Location: [facility] ORS;  Service: Orthopedics;  Laterality: Left;  90 mins   TRICEPS TENDON REPAIR Left 2013     Current Outpatient Medications  Medication Sig Dispense Refill    acetaminophen (TYLENOL) 500 MG tablet Take 1 tablet (500 mg total) by mouth 3 (three) times daily as needed. 30 tablet 0   amoxicillin (AMOXIL) 500 MG tablet amoxicillin 500 mg tablet  TAKE 4 TABLETS BY MOUTH 1 HOUR PRIOR TO DENTAL APPOINTMENT     Ascorbic Acid (VITAMIN C PO) Take 500 mg by mouth daily.     aspirin EC 81 MG tablet Take 1 tablet (81 mg total) by mouth daily. Swallow whole. 90 tablet 3   Budeson-Glycopyrrol-Formoterol (BREZTRI AEROSPHERE) 160-9-4.8 MCG/ACT AERO Inhale 2 puffs into the lungs 2 (two) times daily. 32.1 g 3   Cholecalciferol (VITAMIN D3) 2000 units TABS Take 2,000 Units by mouth daily.     doxycycline (VIBRA-TABS) 100 MG tablet Take 1 tablet (100 mg total) by mouth 2 (two) times daily. 14 tablet 0   ezetimibe (ZETIA) 10 MG tablet Take 1 tablet (10 mg total) by mouth daily. 90 tablet 3   furosemide (LASIX) 40 MG tablet Take 1 tablet (40 mg total) by mouth daily. 90 tablet 3   gabapentin (NEURONTIN) 300 MG capsule Take 900 mg by mouth daily.     ipratropium-albuterol (DUONEB) 0.5-2.5 (3) MG/3ML SOLN Take 3 mLs by nebulization every 6 (six) hours as needed. 75 mL 12   metoprolol succinate (TOPROL-XL) 100 MG 24 hr tablet Take 1 tablet (100 mg total) by mouth daily. Take with or immediately following a meal. 90 tablet 3   nitroGLYCERIN (NITROSTAT) 0.4 MG SL tablet Place 1 tablet (0.4 mg total) under the tongue every 5 (five) minutes as needed. 25 tablet 2   pantoprazole (PROTONIX) 40 MG tablet Take 1 tablet (40 mg total) by mouth daily. 90 tablet 3   potassium chloride (KLOR-CON) 10 MEQ tablet TAKE 1 TABLET BY MOUTH  TWICE DAILY 180 tablet 3   Spacer/Aero-Holding Chambers (AEROCHAMBER MV) inhaler Use as instructed 1 each 0   traZODone (DESYREL) 50 MG tablet TAKE 1/2 TO 1 TABLET BY MOUTH AT BEDTIME AS NEEDED FOR SLEEP 90 tablet 0   vitamin B-12 (CYANOCOBALAMIN) 1000 MCG tablet Take 1 tablet (1,000 mcg total) by mouth daily.     dapagliflozin propanediol (FARXIGA) 10 MG TABS  tablet Take 1 tablet (10 mg total) by mouth daily before breakfast. 90 tablet 3   sacubitril-valsartan (ENTRESTO) 97-103 MG Take 1 tablet by mouth 2 (two) times daily. 180 tablet 3   No current facility-administered medications for this visit.    Allergies:   Penicillins, Amlodipine, Effexor [venlafaxine], Lisinopril, Mevacor [lovastatin], Plavix [clopidogrel], Vicodin [hydrocodone-acetaminophen], Zocor [simvastatin], and Oseltamivir phosphate    Social History:  The patient  reports that he has never smoked. He has  never used smokeless tobacco. He reports current alcohol use of about 14.0 standard drinks per week. He reports that he does not use drugs.   Family History:  The patient's family history includes Asthma in his sister; COPD in his brother and father; Heart attack in his father; Heart disease in his brother; Hypertension in his father.    ROS:   Please see the history of present illness. (+) Neuropathy, burning sensations in bilateral feet All other systems are reviewed and negative.    PHYSICAL EXAM: VS:  BP 130/82 (BP Location: Left Arm, Patient Position: Sitting, Cuff Size: Normal)   Pulse 75   Ht $R'5\' 11"'rD$  (1.803 m)   Wt 212 lb (96.2 kg)   SpO2 96%   BMI 29.57 kg/m  , BMI Body mass index is 29.57 kg/m. GENERAL:  Well appearing HEENT: Pupils equal round and reactive, fundi not visualized, oral mucosa unremarkable NECK:  No jugular venous distention, waveform within normal limits, carotid upstroke brisk and symmetric, no bruits LUNGS:  Clear to auscultation bilaterally HEART:  RRR.  PMI not displaced or sustained,S1 and S2 within normal limits, no S3, no S4, no clicks, no rubs, no murmurs ABD:  Flat, positive bowel sounds normal in frequency in pitch, no bruits, no rebound, no guarding, no midline pulsatile mass, no hepatomegaly, no splenomegaly EXT:  2 plus pulses throughout, no edema, no cyanosis no clubbing SKIN:  No rashes no nodules NEURO:  Cranial nerves II  through XII grossly intact, motor grossly intact throughout PSYCH:  Cognitively intact, oriented to person place and time   EKG:   12/26/2020: EKG was not ordered.  09/04/2020: Sinus bradycardia. Rate 59 bpm. RBBB. 1st degree AV block. LAD. 03/28/20: Sinus rhythm.  Rate 62 bpm.  Right bundle branch block. 10/30/15: Sinus rhythm.  Rate 60 bpm.  RBBB.  LAD.  09/25/15: atrial fibrillation.  Rate 84 bpm.   Echo 12/24/2020:  1. Left ventricular ejection fraction, by estimation, is 55 to 60%. Left  ventricular ejection fraction by 3D volume is 60 %. The left ventricle has  normal function. The left ventricle has no regional wall motion  abnormalities. Left ventricular diastolic   function could not be evaluated.   2. Right ventricular systolic function is normal. The right ventricular  size is normal. There is normal pulmonary artery systolic pressure. The  estimated right ventricular systolic pressure is 36.6 mmHg.   3. Left atrial size was moderately dilated.   4. Right atrial size was moderately dilated.   5. The mitral valve was not well visualized. No evidence of mitral valve  regurgitation.   6. The aortic valve is tricuspid. Aortic valve regurgitation is mild.  Aortic valve sclerosis/calcification is present, without any evidence of  aortic stenosis.   7. Aortic dilatation noted. There is borderline dilatation of the aortic  root, measuring 38 mm.   8. The inferior vena cava is normal in size with greater than 50%  respiratory variability, suggesting right atrial pressure of 3 mmHg.   Comparison(s): Changes from prior study are noted. 09/13/2020: LVEF 45-50%.   [place] AAA Duplex 05/14/2020: Summary:  Abdominal Aorta: No evidence of an abdominal aortic aneurysm was  visualized.   Stenosis:  Stable dimensions of abdominal aorta and common and external iliac  arteries.   Echo 10/06/2019: 1. Left ventricular ejection fraction, by estimation, is 60 to 65%. The  left ventricle has normal  function. The left ventricle has no regional  wall motion abnormalities. There is moderate concentric  left ventricular  hypertrophy. Left ventricular  diastolic parameters are consistent with Grade II diastolic dysfunction  (pseudonormalization).   2. Right ventricular systolic function is normal. The right ventricular  size is mildly enlarged. There is normal pulmonary artery systolic  pressure.   3. Left atrial size was mildly dilated.   4. The mitral valve is normal in structure. Trivial mitral valve  regurgitation. No evidence of mitral stenosis.   5. The aortic valve has an indeterminant number of cusps. Aortic valve  regurgitation is mild to moderate. No aortic stenosis is present.   6. The inferior vena cava is normal in size with greater than 50%  respiratory variability, suggesting right atrial pressure of 3 mmHg.  14 Day Event Monitor 07/2019:   Quality: Fair.  Baseline artifact. Predominant rhythm: sinus rhythm Average heart rate: 69 bpm Max heart rate: 113 bpm Min heart rate: 52 bpm   PACs, PVCs and ventricular bigeminy.  PCI 01/11/19: Prox LAD to Mid LAD lesion is 80% stenosed. A drug-eluting stent was successfully placed using a STENT RESOLUTE ONYX 3.5X18. Post intervention, there is a 0% residual stenosis. Mid LAD lesion is 70% stenosed. A drug-eluting stent was successfully placed using a STENT RESOLUTE ONYX 2.75X26. Post intervention, there is a 0% residual stenosis.   1. Severe, heavily calcified stenoses in the proximal and mid LAD 2. Successful orbital atherectomy of the proximal and mid LAD 3. Successful placement of 2 drug eluting stents in the proximal and mid LAD (overlapping)   Recommendations: Continue ASA and Plavix for at least six months.   LHC 01/10/19: Prox LAD lesion is 80% stenosed. Prox LAD to Mid LAD lesion is 60% stenosed with 50% stenosed side branch in 2nd Diag. Dist LAD lesion is 45% stenosed. Ost Cx to Prox Cx lesion is 10% stenosed.  1st Mrg lesion is 30% stenosed. Prox Cx to Mid Cx lesion is 30% stenosed. ----------- Dist RCA lesion is 30% stenosed with 30% stenosed side branch in RPDA. ----------- The left ventricular systolic function is normal. The left ventricular ejection fraction is 55-65% by visual estimate.  Diagnostic Dominance: Right    Echo 03/25/18:   1. The left ventricle has a visually estimated ejection fraction of of 50%. The cavity size was normal. There is mildly increased left ventricular wall thickness. Left ventricular diastolic Doppler parameters are consistent with impaired relaxation Left  ventricular diffuse hypokinesis.  2. The right ventricle has mildly reduced systolic function. The cavity was mildly enlarged. There is no increase in right ventricular wall thickness.  3. Left atrial size was mildly dilated.  4. Right atrial size was mildly dilated.  5. The mitral valve is normal in structure. No evidence of mitral valve stenosis. Trivial regurgitation.  6. The tricuspid valve is normal in structure.  7. The aortic valve is tricuspid There is mild calcification of the aortic valve. Aortic valve regurgitation is trivial by color flow Doppler. No stenosis.  8. The pulmonic valve was normal in structure.  9. There is mild dilatation of the aortic root measuring 38 mm. 10. Right atrial pressure is estimated at 3 mmHg. 11. No complete TR doppler jet so unable to estimate PA systolic pressure.   Lexiscan Myoview 10/17/15: The left ventricular ejection fraction is moderately decreased (30-44%). Nuclear stress EF: 39%. There was no ST segment deviation noted during stress. There is a small defect of moderate severity present in the apical lateral and apex location and medium sized defect of moderate severity in the basal  inferior, mild inferior and apical inferior location. These defects are fixed and consistent with scar. No ischemia noted. This is an intermediate risk study due to LV  dysfunction.   Recent Labs: 01/30/2020: ALT 15 02/06/2020: Hemoglobin 11.4; Platelets 176.0; TSH 1.70 10/08/2020: BUN 18; Creatinine, Ser 1.07; Potassium 4.8; Sodium 138    Lipid Panel    Component Value Date/Time   CHOL 116 01/30/2020 0843   CHOL 121 07/29/2016 0822   TRIG 78.0 01/30/2020 0843   HDL 43.50 01/30/2020 0843   HDL 56 07/29/2016 0822   CHOLHDL 3 01/30/2020 0843   VLDL 15.6 01/30/2020 0843   LDLCALC 57 01/30/2020 0843   LDLCALC 56 07/29/2016 0822      Wt Readings from Last 3 Encounters:  12/26/20 212 lb (96.2 kg)  12/20/20 212 lb (96.2 kg)  10/29/20 214 lb 6.4 oz (97.3 kg)      ASSESSMENT AND PLAN:  PVC's (premature ventricular contractions) Stable on metoprolol.  Nonischemic cardiomyopathy (HCC) Chronic systolic diastolic heart failure.  LVEF was 45 to 50% but improved to 55 to 60% since starting [place].  He is euvolemic on exam and doing well.  Check a CMP today.  Essential hypertension Blood pressure well-controlled on Entresto and metoprolol.  HLD (hyperlipidemia) LDL goal is less than 70 given prior PCI.  His peripheral neuropathy seems to be doing better since he stopped atorvastatin.  We will continue Zetia.  We will have him see the pharmacist to consider a PCSK9 inhibitor.  He has been on 2 other statins in the past and has not tolerated them.  Check fasting lipids and CMP today as a baseline.  CAD S/P percutaneous coronary angioplasty He is doing well and has no angina.  Status post LAD stenting.  He has otherwise nonobstructive CAD.  Continue medical management with metoprolol, aspirin, and Zetia.  We are stopping atorvastatin due to worsening of his peripheral neuropathy and we will get him started on a PCSK9 inhibitor.    Current medicines are reviewed at length with the patient today.  The patient does not have concerns regarding medicines.  The following changes have been made: none  Labs/ tests ordered today include:    Orders Placed This Encounter  Procedures   Comp Met (CMET)   Lipid panel     Disposition:   FU with Lanijah Warzecha C. [name], MD, [facility] in 6 months.  I,Mathew Stumpf,acting as a Education administrator for Skeet Latch, MD.,have documented all relevant documentation on the behalf of Skeet Latch, MD,as directed by  Skeet Latch, MD while in the presence of Skeet Latch, MD.  I, [place] [name], MD have reviewed all documentation for this visit.  The documentation of the exam, diagnosis, procedures, and orders on 12/26/2020 are all accurate and complete.  Signed, Saraann Enneking C. [name], MD, [facility]  12/26/2020 11:13 AM    [place]

## 2020-12-26 NOTE — Assessment & Plan Note (Signed)
He is doing well and has no angina.  Status post LAD stenting.  He has otherwise nonobstructive CAD.  Continue medical management with metoprolol, aspirin, and Zetia.  We are stopping atorvastatin due to worsening of his peripheral neuropathy and we will get him started on a PCSK9 inhibitor.

## 2020-12-27 LAB — LIPID PANEL
Chol/HDL Ratio: 3 ratio (ref 0.0–5.0)
Cholesterol, Total: 145 mg/dL (ref 100–199)
HDL: 48 mg/dL (ref >39)
LDL Chol Calc (NIH): 77 mg/dL (ref 0–99)
Triglycerides: 109 mg/dL (ref 0–149)
VLDL Cholesterol Cal: 20 mg/dL (ref 5–40)

## 2020-12-27 LAB — COMPREHENSIVE METABOLIC PANEL
ALT: 16 IU/L (ref 0–44)
AST: 19 IU/L (ref 0–40)
Albumin/Globulin Ratio: 2.1 (ref 1.2–2.2)
Albumin: 4.7 g/dL — ABNORMAL HIGH (ref 3.6–4.6)
Alkaline Phosphatase: 74 IU/L (ref 44–121)
BUN/Creatinine Ratio: 18 (ref 10–24)
BUN: 20 mg/dL (ref 8–27)
Bilirubin Total: 0.8 mg/dL (ref 0.0–1.2)
CO2: 23 mmol/L (ref 20–29)
Calcium: 9.5 mg/dL (ref 8.6–10.2)
Chloride: 100 mmol/L (ref 96–106)
Creatinine, Ser: 1.09 mg/dL (ref 0.76–1.27)
Globulin, Total: 2.2 g/dL (ref 1.5–4.5)
Glucose: 101 mg/dL — ABNORMAL HIGH (ref 70–99)
Potassium: 4.4 mmol/L (ref 3.5–5.2)
Sodium: 140 mmol/L (ref 134–144)
Total Protein: 6.9 g/dL (ref 6.0–8.5)
eGFR: 67 mL/min/{1.73_m2} (ref >59)

## 2021-01-06 NOTE — Progress Notes (Signed)
Subjective:   Theodore [name] is a 86 y.o. male who presents for Medicare Annual/Subsequent preventive examination.  I connected with Theodore [name] today by telephone and verified that I am speaking with the correct person using two identifiers. Location patient: home Location provider: work Persons participating in the virtual visit: patient, Marine scientist.    I discussed the limitations, risks, security and privacy concerns of performing an evaluation and management service by telephone and the availability of in person appointments. I also discussed with the patient that there may be a patient responsible charge related to this service. The patient expressed understanding and verbally consented to this telephonic visit.    Interactive audio and video telecommunications were attempted between this provider and patient, however failed, due to patient having technical difficulties OR patient did not have access to video capability.  We continued and completed visit with audio only.  Some vital signs may be absent or patient reported.   Time Spent with patient on telephone encounter: 30 minutes  Review of Systems     Cardiac Risk Factors include: advanced age (>39men, >70 women);hypertension;dyslipidemia     Objective:    Today's Vitals   01/08/21 1027  Weight: 212 lb (96.2 kg)  Height: 5\' 11"  (1.803 m)   Body mass index is 29.57 kg/m.  Advanced Directives 01/08/2021 08/09/2019 01/10/2019 12/02/2018 08/06/2018 07/23/2017 03/03/2017  Does Patient Have a Medical Advance Directive? Yes Yes Yes No Yes Yes Yes  Type of Paramedic of [place];Living will [place];Living will Healthcare Power of [place];Living will Living will;Healthcare Power of [place];Living will  Does patient want to make changes to medical advance directive? Yes (MAU/Ambulatory/Procedural Areas - Information given) - No -  Guardian declined - No - Patient declined - No - Patient declined  Copy of [place] in Chart? Yes - validated most recent copy scanned in chart (See row information) Yes - validated most recent copy scanned in chart (See row information) No - copy requested - Yes - validated most recent copy scanned in chart (See row information) Yes No - copy requested  Would patient like information on creating a medical advance directive? - - - No - Patient declined - - -    Current Medications (verified) Outpatient Encounter Medications as of 01/08/2021  Medication Sig   acetaminophen (TYLENOL) 500 MG tablet Take 1 tablet (500 mg total) by mouth 3 (three) times daily as needed.   amoxicillin (AMOXIL) 500 MG tablet amoxicillin 500 mg tablet  TAKE 4 TABLETS BY MOUTH 1 HOUR PRIOR TO DENTAL APPOINTMENT   Ascorbic Acid (VITAMIN C PO) Take 500 mg by mouth daily.   aspirin EC 81 MG tablet Take 1 tablet (81 mg total) by mouth daily. Swallow whole.   Budeson-Glycopyrrol-Formoterol (BREZTRI AEROSPHERE) 160-9-4.8 MCG/ACT AERO Inhale 2 puffs into the lungs 2 (two) times daily.   Cholecalciferol (VITAMIN D3) 2000 units TABS Take 2,000 Units by mouth daily.   dapagliflozin propanediol (FARXIGA) 10 MG TABS tablet Take 1 tablet (10 mg total) by mouth daily before breakfast.   doxycycline (VIBRA-TABS) 100 MG tablet Take 1 tablet (100 mg total) by mouth 2 (two) times daily.   ezetimibe (ZETIA) 10 MG tablet Take 1 tablet (10 mg total) by mouth daily.   furosemide (LASIX) 40 MG tablet Take 1 tablet (40 mg total) by mouth daily.   gabapentin (NEURONTIN) 300 MG capsule Take 900 mg by mouth daily.  ipratropium-albuterol (DUONEB) 0.5-2.5 (3) MG/3ML SOLN Take 3 mLs by nebulization every 6 (six) hours as needed.   metoprolol succinate (TOPROL-XL) 100 MG 24 hr tablet Take 1 tablet (100 mg total) by mouth daily. Take with or immediately following a meal.   nitroGLYCERIN (NITROSTAT) 0.4 MG SL tablet Place 1 tablet  (0.4 mg total) under the tongue every 5 (five) minutes as needed.   pantoprazole (PROTONIX) 40 MG tablet Take 1 tablet (40 mg total) by mouth daily.   potassium chloride (KLOR-CON) 10 MEQ tablet TAKE 1 TABLET BY MOUTH  TWICE DAILY   sacubitril-valsartan (ENTRESTO) 97-103 MG Take 1 tablet by mouth 2 (two) times daily.   Spacer/Aero-Holding Chambers (AEROCHAMBER MV) inhaler Use as instructed   traZODone (DESYREL) 50 MG tablet TAKE 1/2 TO 1 TABLET BY MOUTH AT BEDTIME AS NEEDED FOR SLEEP   vitamin B-12 (CYANOCOBALAMIN) 1000 MCG tablet Take 1 tablet (1,000 mcg total) by mouth daily.   No facility-administered encounter medications on file as of 01/08/2021.    Allergies (verified) Penicillins, Amlodipine, Effexor [venlafaxine], Lisinopril, Mevacor [lovastatin], Plavix [clopidogrel], Vicodin [hydrocodone-acetaminophen], Zocor [simvastatin], and Oseltamivir phosphate   History: Past Medical History:  Diagnosis Date   CAP (community acquired pneumonia) 01/19/2018   History of arthritis    History of rheumatic fever    Hypercholesterolemia    Hypertension    Nocturia    PAF (paroxysmal atrial fibrillation) [facility]) January 2013   Placed on Pradaxa. Did not require cardioversion; spontaneously converted   Peripheral edema    Persistent atrial fibrillation ([place]) 01/21/2011   S/p ablation 12/2015 NSR up until his PCI Dec 2020 when he went into AF post PCI   Right bundle branch block    SOB (shortness of breath)    Tendonitis of elbow, left    Past Surgical History:  Procedure Laterality Date   ATRIAL FIBRILLATION ABLATION  12/19/2015   BACK SURGERY  12/24/09   fusion C3-C4   BACK SURGERY  2010   fusion L4-L5 [facility])   CARDIAC CATHETERIZATION  2009   NONOBSTRUCTIVE ATHERSCLEROTIC CORONARY DISEASE AND NORMAL  LV FUNCTION   CARDIAC CATHETERIZATION N/A 09/08/2014   Procedure: Left Heart Cath and Coronary Angiography;  Surgeon: Belva Crome, MD;  Location: [place] [facility];  Service:  Cardiovascular;  Laterality: N/A;   CARDIAC CATHETERIZATION N/A 09/28/2015   Procedure: Left Heart Cath and Coronary Angiography;  Surgeon: Leonie Man, MD;  Location: [place] [facility];  Service: Cardiovascular;  Laterality: N/A;   CARDIAC CATHETERIZATION N/A 09/28/2015   Procedure: Intravascular Pressure Wire/FFR Study;  Surgeon: Leonie Man, MD;  Location: [place] [facility];  Service: Cardiovascular;  Laterality: N/A;   CARDIOVERSION N/A 10/23/2015   Procedure: CARDIOVERSION;  Surgeon: [name], MD;  Location[facility] ENDOSCOPY;  Service: Cardiovascular;  Laterality: N/A;   COLONOSCOPY  2013   per patient, rpt 5 yrs   COLONOSCOPY WITH PROPOFOL N/A 12/02/2018   TAx2, HP, diverticulosis [name], MD)   CORONARY ATHERECTOMY N/A 01/11/2019   Procedure: CORONARY ATHERECTOMY;  Surgeon: Burnell [name], MD;  Location: [place] [facility];  Service: Cardiovascular;  Laterality: N/A;   CORONARY STENT INTERVENTION N/A 01/11/2019   Procedure: CORONARY STENT INTERVENTION;  Surgeon: Burnell [name], MD;  Location: [place] [facility];  Service: Cardiovascular;  Laterality: N/A;   ELECTROPHYSIOLOGIC STUDY N/A 12/19/2015   Procedure: Atrial Fibrillation Ablation;  Surgeon: [name], MD;  Location: [place] [facility];  Service: Cardiovascular;  Laterality: N/A;  ESOPHAGOGASTRODUODENOSCOPY (EGD) WITH PROPOFOL N/A 12/02/2018   medium HH [name], MD)   INTRAVASCULAR PRESSURE WIRE/FFR STUDY N/A 01/10/2019   Procedure: INTRAVASCULAR PRESSURE WIRE/FFR STUDY;  Surgeon: Leonie Man, MD;  Location: [place] [facility];  Service: Cardiovascular;  Laterality: N/A;   KNEE ARTHROSCOPY Left 03/2016   Dr. [name]   LEFT HEART CATH AND CORONARY ANGIOGRAPHY N/A 01/10/2019   Procedure: LEFT HEART CATH AND CORONARY ANGIOGRAPHY;  Surgeon: Leonie Man, MD;  Location: [place] [facility];  Service: Cardiovascular;  Laterality: N/A;   PATELLAR TENDON REPAIR Left 2008    POLYPECTOMY  12/02/2018   Procedure: POLYPECTOMY;  Surgeon: [name], MD;  Location: [facility] ENDOSCOPY;  Service: Endoscopy;;   REPLACEMENT TOTAL KNEE Right 2006   TOTAL KNEE ARTHROPLASTY Left 02/09/2017   Procedure: LEFT TOTAL KNEE ARTHROPLASTY, EXCISION LEFT DISTAL THIGH MASS;  Surgeon: [name], MD;  Location: [facility] ORS;  Service: Orthopedics;  Laterality: Left;  90 mins   TRICEPS TENDON REPAIR Left 2013   Family History  Problem Relation Age of Onset   Heart attack Father    COPD Father    Hypertension Father    Heart disease Brother    COPD Brother    Asthma Sister    Stroke Neg Hx    Social History   Socioeconomic History   Marital status: Married    Spouse name: Not on file   Number of children: Not on file   Years of education: Not on file   Highest education level: Not on file  Occupational History   Occupation: retired    Fish farm manager: RETIRED    Comment: Art gallery manager  Tobacco Use   Smoking status: Never   Smokeless tobacco: Never  Vaping Use   Vaping Use: Never used  Substance and Sexual Activity   Alcohol use: Yes    Alcohol/week: 14.0 standard drinks    Types: 14 Cans of beer per week    Comment: 2 beers daily   Drug use: No   Sexual activity: Not Currently  Other Topics Concern   Not on file  Social History Narrative   Lives with wife, no pets   Retired   Probation officer: Art gallery manager   Edu: master's   Activity: golf   Diet: good water, fruits/vegetables daily   Social Determinants of Radio broadcast assistant Strain: Not on file  Food Insecurity: No Food Insecurity   Worried About Charity fundraiser in the Last Year: Never true   Arboriculturist in the Last Year: Never true  Transportation Needs: No Transportation Needs   Lack of Transportation (Medical): No   Lack of Transportation (Non-Medical): No  Physical Activity: Insufficiently Active   Days of Exercise per Week: 2 days   Minutes of Exercise per Session: 60 min  Stress: No  Stress Concern Present   Feeling of Stress : Not at all  Social Connections: Moderately Integrated   Frequency of Communication with Friends and Family: More than three times a week   Frequency of Social Gatherings with Friends and Family: Three times a week   Attends Religious Services: Never   Active Member of Clubs or Organizations: Yes   Attends Music therapist: More than 4 times per year   Marital Status: Married    Tobacco Counseling Counseling given: Not Answered   Clinical Intake:  Pre-visit preparation completed: Yes  Pain : No/denies pain     BMI - recorded: 29.58 Nutritional Risks: None  Diabetes: No  How often do you need to have someone help you when you read instructions, pamphlets, or other written materials from your doctor or pharmacy?: 1 - Never  Diabetic?No  Interpreter Needed?: No  Information entered by :: Orrin Brigham LPN   Activities of Daily Living In your present state of health, do you have any difficulty performing the following activities: 01/08/2021  Hearing? N  Vision? N  Difficulty concentrating or making decisions? N  Walking or climbing stairs? N  Dressing or bathing? N  Doing errands, shopping? N  Preparing Food and eating ? N  Using the Toilet? N  In the past six months, have you accidently leaked urine? N  Do you have problems with loss of bowel control? N  Managing your Medications? N  Managing your Finances? N  Housekeeping or managing your Housekeeping? N  Some recent data might be hidden    Patient Care Team: [name], MD as PCP - General (Family Medicine) Constance Haw, MD as PCP - Electrophysiology (Cardiology) Skeet Latch, MD as PCP - Cardiology (Cardiology) Skeet Latch, MD as Attending Physician (Cardiology) Druscilla Brownie, MD as Consulting Physician (Dermatology) Deneise Lever, MD as Consulting Physician (Pulmonary Disease) Dingeldein, Remo Lipps, MD as Referring  Physician (Ophthalmology)  Indicate any recent Medical Services you may have received from other than [facility] providers in the past year (date may be approximate).     Assessment:   This is a routine wellness examination for Jotham.  Hearing/Vision screen Hearing Screening - Comments:: No issues Vision Screening - Comments:: Last exam 2021, has an appointment 03/2021, wears glasses  Dietary issues and exercise activities discussed: Current Exercise Habits: Home exercise routine, Type of exercise: walking, Time (Minutes): 60, Frequency (Times/Week): 2, Weekly Exercise (Minutes/Week): 120, Intensity: Mild   Goals Addressed             This Visit's Progress    Patient Stated       Would like to maintain current health status       Depression Screen PHQ 2/9 Scores 01/08/2021 08/09/2019 02/14/2019 08/06/2018 07/23/2017 01/13/2017 07/17/2016  PHQ - 2 Score 0 0 0 0 0 0 0  PHQ- 9 Score - 0 - 0 0 - -    Fall Risk Fall Risk  01/08/2021 08/09/2019 02/14/2019 08/06/2018 07/23/2017  Falls in the past year? 0 0 0 0 No  Number falls in past yr: 0 0 - - -  Injury with Fall? 0 0 - - -  Risk for fall due to : No Fall Risks Medication side effect - - -  Follow up Falls prevention discussed Falls evaluation completed;Falls prevention discussed - - -    FALL RISK PREVENTION PERTAINING TO THE HOME:  Any stairs in or around the home? Yes  If so, are there any without handrails? No  Home free of loose throw rugs in walkways, pet beds, electrical cords, etc? Yes  Adequate lighting in your home to reduce risk of falls? Yes   ASSISTIVE DEVICES UTILIZED TO PREVENT FALLS:  Life alert? No  Use of a cane, walker or w/c? No  Grab bars in the bathroom? Yes  Shower chair or bench in shower? Yes  Elevated toilet seat or a handicapped toilet? No   TIMED UP AND GO:  Was the test performed? No , visit completed over the phone.   Cognitive Function: Normal cognitive status assessed by this Nurse Health  Advisor. No abnormalities found.   MMSE - Mini Mental State  Exam 08/09/2019 08/06/2018 07/23/2017 07/17/2016 08/07/2015  Orientation to time 5 5 5 5 5   Orientation to Place 5 5 5 5 5   Registration 3 3 3 3 3   Attention/ Calculation 5 0 0 0 0  Recall 3 3 3 3 3   Language- name 2 objects - 0 0 0 0  Language- repeat 1 1 1 1 1   Language- follow 3 step command - 0 3 3 3   Language- read & follow direction - 0 0 0 0  Write a sentence - 0 0 0 0  Copy design - 0 0 0 0  Total score - 17 20 20 20         Immunizations Immunization History  Administered Date(s) Administered   Influenza Split 11/11/2010, 11/11/2011, 11/23/2012   Influenza Whole 11/10/2009   Influenza, High Dose Seasonal PF 11/17/2016, 10/22/2017, 10/26/2018, 10/22/2019   Influenza,inj,Quad PF,6+ Mos 10/25/2015   Influenza,inj,quad, With Preservative 10/13/2016   Influenza-Unspecified 11/10/2013, 10/23/2014, 10/13/2019, 11/06/2020   PFIZER(Purple Top)SARS-COV-2 Vaccination 02/27/2019, 03/18/2019, 10/24/2019   Pfizer Covid-19 Vaccine Bivalent Booster 73yrs & up 11/06/2020   Pneumococcal Conjugate-13 10/23/2014   Pneumococcal Polysaccharide-23 11/10/2009   Zoster Recombinat (Shingrix) 08/09/2019, 09/27/2019   Zoster, Live 02/11/2012    TDAP status: Due, Education has been provided regarding the importance of this vaccine. Advised may receive this vaccine at local pharmacy or Health Dept. Aware to provide a copy of the vaccination record if obtained from local pharmacy or Health Dept. Verbalized acceptance and understanding.  Flu Vaccine status: Up to date  Pneumococcal vaccine status: Up to date  Covid-19 vaccine status: Completed vaccines  Qualifies for Shingles Vaccine? Yes   Zostavax completed Yes   Shingrix Completed?: Yes  Screening Tests Health Maintenance  Topic Date Due   TETANUS/TDAP  Never done   Pneumonia Vaccine 66+ Years old  Completed   INFLUENZA VACCINE  Completed   COVID-19 Vaccine  Completed   Zoster  Vaccines- Shingrix  Completed   HPV VACCINES  Aged Out    Health Maintenance  Health Maintenance Due  Topic Date Due   TETANUS/TDAP  Never done    Colorectal cancer screening: No longer required.   Lung Cancer Screening: (Low Dose CT Chest recommended if Age 84-80 years, 30 pack-year currently smoking OR have quit w/in 15years.) does not qualify.     Additional Screening:  Hepatitis C Screening: does not qualify  Vision Screening: Recommended annual ophthalmology exams for early detection of glaucoma and other disorders of the eye. Is the patient up to date with their annual eye exam?  Yes  Who is the provider or what is the name of the office in which the patient attends annual eye exams? Patient does not have provider information available   Dental Screening: Recommended annual dental exams for proper oral hygiene  Community Resource Referral / Chronic Care Management: CRR required this visit?  No   CCM required this visit?  No      Plan:     I have personally reviewed and noted the following in the patient's chart:   Medical and social history Use of alcohol, tobacco or illicit drugs  Current medications and supplements including opioid prescriptions. Patient is not currently taking opioid prescriptions. Functional ability and status Nutritional status Physical activity Advanced directives List of other physicians Hospitalizations, surgeries, and ER visits in previous 12 months Vitals Screenings to include cognitive, depression, and falls Referrals and appointments  In addition, I have reviewed and discussed with patient certain preventive protocols,  quality metrics, and best practice recommendations. A written personalized care plan for preventive services as well as general preventive health recommendations were provided to patient.   Due to this being a telephonic visit, the after visit summary with patients personalized plan was offered to patient via  mail or my-chart. Patient would like to access on my-chart.   Loma Messing, LPN   [id number]   Nurse Health Advisor  Nurse Notes: none

## 2021-01-08 ENCOUNTER — Ambulatory Visit (INDEPENDENT_AMBULATORY_CARE_PROVIDER_SITE_OTHER): Payer: Medicare Other

## 2021-01-08 VITALS — Ht 71.0 in | Wt 212.0 lb

## 2021-01-08 DIAGNOSIS — Z0000 Encounter for general adult medical examination without abnormal findings: Secondary | ICD-10-CM

## 2021-01-08 NOTE — Patient Instructions (Signed)
Theodore [name] , Thank you for taking time to complete your Medicare Wellness Visit. I appreciate your ongoing commitment to your health goals. Please review the following plan we discussed and let me know if I can assist you in the future.   Screening recommendations/referrals: Colonoscopy: no longer required Recommended yearly ophthalmology/optometry visit for glaucoma screening and checkup Recommended yearly dental visit for hygiene and checkup  Vaccinations: Influenza vaccine: up to date Pneumococcal vaccine: up to date Tdap vaccine: Due-May obtain vaccine at  your local pharmacy.  Shingles vaccine: up to date   Covid-19: up to date  Advanced directives: copy on file  Conditions/risks identified: see problem list  Next appointment: Follow up in one year for your annual wellness visit. 01/14/22 @ 8:15am , this will be a telephone visit  Preventive Care 84 Years and Older, Male Preventive care refers to lifestyle choices and visits with your health care provider that can promote health and wellness. What does preventive care include? A yearly physical exam. This is also called an annual well check. Dental exams once or twice a year. Routine eye exams. Ask your health care provider how often you should have your eyes checked. Personal lifestyle choices, including: Daily care of your teeth and gums. Regular physical activity. Eating a healthy diet. Avoiding tobacco and drug use. Limiting alcohol use. Practicing safe sex. Taking low doses of aspirin every day. Taking vitamin and mineral supplements as recommended by your health care provider. What happens during an annual well check? The services and screenings done by your health care provider during your annual well check will depend on your age, overall health, lifestyle risk factors, and family history of disease. Counseling  Your health care provider may ask you questions about your: Alcohol use. Tobacco use. Drug  use. Emotional well-being. Home and relationship well-being. Sexual activity. Eating habits. History of falls. Memory and ability to understand (cognition). Work and work Statistician. Screening  You may have the following tests or measurements: Height, weight, and BMI. Blood pressure. Lipid and cholesterol levels. These may be checked every 5 years, or more frequently if you are over 51 years old. Skin check. Lung cancer screening. You may have this screening every year starting at age 42 if you have a 30-pack-year history of smoking and currently smoke or have quit within the past 15 years. Fecal occult blood test (FOBT) of the stool. You may have this test every year starting at age 41. Flexible sigmoidoscopy or colonoscopy. You may have a sigmoidoscopy every 5 years or a colonoscopy every 10 years starting at age 62. Prostate cancer screening. Recommendations will vary depending on your family history and other risks. Hepatitis C blood test. Hepatitis B blood test. Sexually transmitted disease (STD) testing. Diabetes screening. This is done by checking your blood sugar (glucose) after you have not eaten for a while (fasting). You may have this done every 1-3 years. Abdominal aortic aneurysm (AAA) screening. You may need this if you are a current or former smoker. Osteoporosis. You may be screened starting at age 70 if you are at high risk. Talk with your health care provider about your test results, treatment options, and if necessary, the need for more tests. Vaccines  Your health care provider may recommend certain vaccines, such as: Influenza vaccine. This is recommended every year. Tetanus, diphtheria, and acellular pertussis (Tdap, Td) vaccine. You may need a Td booster every 10 years. Zoster vaccine. You may need this after age 25. Pneumococcal 13-valent conjugate (PCV13) vaccine.  One dose is recommended after age 67. Pneumococcal polysaccharide (PPSV23) vaccine. One dose is  recommended after age 24. Talk to your health care provider about which screenings and vaccines you need and how often you need them. This information is not intended to replace advice given to you by your health care provider. Make sure you discuss any questions you have with your health care provider. Document Released: 02/23/2015 Document Revised: 10/17/2015 Document Reviewed: 11/28/2014 Elsevier Interactive Patient Education  2017 [place] Prevention in the Home Falls can cause injuries. They can happen to people of all ages. There are many things you can do to make your home safe and to help prevent falls. What can I do on the outside of my home? Regularly fix the edges of walkways and driveways and fix any cracks. Remove anything that might make you trip as you walk through a door, such as a raised step or threshold. Trim any bushes or trees on the path to your home. Use bright outdoor lighting. Clear any walking paths of anything that might make someone trip, such as rocks or tools. Regularly check to see if handrails are loose or broken. Make sure that both sides of any steps have handrails. Any raised decks and porches should have guardrails on the edges. Have any leaves, snow, or ice cleared regularly. Use sand or salt on walking paths during winter. Clean up any spills in your garage right away. This includes oil or grease spills. What can I do in the bathroom? Use night lights. Install grab bars by the toilet and in the tub and shower. Do not use towel bars as grab bars. Use non-skid mats or decals in the tub or shower. If you need to sit down in the shower, use a plastic, non-slip stool. Keep the floor dry. Clean up any water that spills on the floor as soon as it happens. Remove soap buildup in the tub or shower regularly. Attach bath mats securely with double-sided non-slip rug tape. Do not have throw rugs and other things on the floor that can make you  trip. What can I do in the bedroom? Use night lights. Make sure that you have a light by your bed that is easy to reach. Do not use any sheets or blankets that are too big for your bed. They should not hang down onto the floor. Have a firm chair that has side arms. You can use this for support while you get dressed. Do not have throw rugs and other things on the floor that can make you trip. What can I do in the kitchen? Clean up any spills right away. Avoid walking on wet floors. Keep items that you use a lot in easy-to-reach places. If you need to reach something above you, use a strong step stool that has a grab bar. Keep electrical cords out of the way. Do not use floor polish or wax that makes floors slippery. If you must use wax, use non-skid floor wax. Do not have throw rugs and other things on the floor that can make you trip. What can I do with my stairs? Do not leave any items on the stairs. Make sure that there are handrails on both sides of the stairs and use them. Fix handrails that are broken or loose. Make sure that handrails are as long as the stairways. Check any carpeting to make sure that it is firmly attached to the stairs. Fix any carpet that is loose or  worn. Avoid having throw rugs at the top or bottom of the stairs. If you do have throw rugs, attach them to the floor with carpet tape. Make sure that you have a light switch at the top of the stairs and the bottom of the stairs. If you do not have them, ask someone to add them for you. What else can I do to help prevent falls? Wear shoes that: Do not have high heels. Have rubber bottoms. Are comfortable and fit you well. Are closed at the toe. Do not wear sandals. If you use a stepladder: Make sure that it is fully opened. Do not climb a closed stepladder. Make sure that both sides of the stepladder are locked into place. Ask someone to hold it for you, if possible. Clearly mark and make sure that you can  see: Any grab bars or handrails. First and last steps. Where the edge of each step is. Use tools that help you move around (mobility aids) if they are needed. These include: Canes. Walkers. Scooters. Crutches. Turn on the lights when you go into a dark area. Replace any light bulbs as soon as they burn out. Set up your furniture so you have a clear path. Avoid moving your furniture around. If any of your floors are uneven, fix them. If there are any pets around you, be aware of where they are. Review your medicines with your doctor. Some medicines can make you feel dizzy. This can increase your chance of falling. Ask your doctor what other things that you can do to help prevent falls. This information is not intended to replace advice given to you by your health care provider. Make sure you discuss any questions you have with your health care provider. Document Released: 11/23/2008 Document Revised: 07/05/2015 Document Reviewed: 03/03/2014 Elsevier Interactive Patient Education  2017 [employer].

## 2021-01-09 ENCOUNTER — Encounter (HOSPITAL_BASED_OUTPATIENT_CLINIC_OR_DEPARTMENT_OTHER): Payer: Self-pay | Admitting: Cardiovascular Disease

## 2021-01-09 MED ORDER — POTASSIUM CHLORIDE CRYS ER 10 MEQ PO TBCR: 10.0000 meq | EXTENDED_RELEASE_TABLET | Freq: Two times a day (BID) | ORAL | 3 refills | Status: DC

## 2021-01-09 MED ORDER — PANTOPRAZOLE SODIUM 40 MG PO TBEC: 40.0000 mg | DELAYED_RELEASE_TABLET | Freq: Every day | ORAL | 3 refills | Status: DC

## 2021-01-15 ENCOUNTER — Other Ambulatory Visit: Payer: Self-pay

## 2021-01-15 ENCOUNTER — Encounter: Payer: Self-pay | Admitting: Pharmacist Clinician (PhC)/ Clinical Pharmacy Specialist

## 2021-01-15 ENCOUNTER — Ambulatory Visit (INDEPENDENT_AMBULATORY_CARE_PROVIDER_SITE_OTHER): Payer: Medicare Other | Admitting: Pharmacist Clinician (PhC)/ Clinical Pharmacy Specialist

## 2021-01-15 DIAGNOSIS — E7800 Pure hypercholesterolemia, unspecified: Secondary | ICD-10-CM

## 2021-01-15 MED ORDER — PANTOPRAZOLE SODIUM 40 MG PO TBEC: 40.0000 mg | DELAYED_RELEASE_TABLET | Freq: Every day | ORAL | 3 refills | Status: DC

## 2021-01-15 NOTE — Patient Instructions (Signed)
Your Results:             Your most recent labs Goal  Total Cholesterol 145 < 200  Triglycerides 109 < 150  HDL (happy/good cholesterol) 48 > 40  LDL (lousy/bad cholesterol 77 < 70      Medication changes:  We will start the process to get Repatha (or Praluent) covered by your insurance.  Once approved, use 1 pen every 14 days.    Lab orders:  Repeat labs after 4-6 doses, we will send you a lab order after the first of the year.    Patient Assistance:  The [employer] foundation offers assistance to help pay for medication copays.  They will cover copays for all cholesterol lowering meds, including statins, fibrates, omega-3 oils, ezetimibe, Repatha, Praluent, Nexletol, Nexlizet.  The cards are usually good for $2,500 or 12 months, whichever comes first. Go to healthwellfoundation.org Click on "Apply Now" Answer questions as to whom is applying (patient or representative) Your disease fund will be "hypercholesterolemia - Medicare access" They will ask questions about finances and which medications you are taking for cholesterol When you submit, the approval is usually within minutes.  You will need to print the card information from the site You will need to show this information to your pharmacy, they will bill your Medicare Part D plan first -then bill [employer] --for the copay.   You can also call them at [phone], although the hold times can be quite long.   Thank you for choosing [facility]

## 2021-01-15 NOTE — Assessment & Plan Note (Signed)
Patient with ASCVD and hyperlipidemia, LDL not at goal, currently at 77 on ezetimibe 10 mg daily.  Was not able to tolerate multiple statins due to a combination of myalgias and elevated LFT's.  Reviewed options for lowering LDL cholesterol, including ezetimibe, PCSK-9 inhibitors, bempedoic acid and inclisiran.  Discussed mechanisms of action, dosing, side effects and potential decreases in LDL cholesterol.  Also reviewed cost information and potential options for patient assistance.  Answered all patient questions.  Based on this information, patient would prefer to start a PCSK9 inhibitor.

## 2021-01-15 NOTE — Addendum Note (Signed)
Addended by: Gerald Stabs on: 01/15/2021 01:25 PM   Modules accepted: Orders

## 2021-01-15 NOTE — Progress Notes (Signed)
01/15/2021 Theodore [name] [birth year] [id number]   HPI:  Theodore [name] is a 86 y.o. male patient of Theodore [name], who presents today for a lipid clinic evaluation.  See pertinent past medical history below.  He saw Theodore. Oval [name] last month and noted that his peripheral neuropathy has been getting worse over the past several months.  They decided to try some time off the atorvastatin, and patient noted that within a week or so of stopping, the pain was significantly less.  He had previously had problems with cataracts and increases in LFT's on other statins.  Labs were drawn at that visit, thus reflect him being on atorvastatin.    Past Medical History: ASCVD S/p PCI to LAD  CHF Chronic diastolic, EF improved to 25-95% since starting Entresto, metoprolol and Farxiga  hypertension Controlled with CHF meds  AF Persistent, on metoprolol, CHADS2-VASc score 5, ablation 12/2015, no further episodes, no anticoagulation   Current Medications: ezetimibe 10 mg   Cholesterol Goals: LDL < 55   Intolerant/previously tried: lovastatin, simvastatin both caused increase in LFT's, atorvastatin - myalgias, worsening neuropathy  Family history: father with heart disease, COPD, died at 32, mother died when pt was 34; 3 siblings, 1 drowned at 89, others living without heart disease 79,80; 3 children, 1 with autoimmune disorder,  others healthy  Diet: eating mostly home cooked, occasional take out; no added salt; good mix of meat and vegetables  Exercise:  active around the house, does chores,   Labs: 11/22 TC 145, TG 109, HDL 48, LDL 77 on ezetimibe 10 mg    Current Outpatient Medications  Medication Sig Dispense Refill   acetaminophen (TYLENOL) 500 MG tablet Take 1 tablet (500 mg total) by mouth 3 (three) times daily as needed. 30 tablet 0   amoxicillin (AMOXIL) 500 MG tablet amoxicillin 500 mg tablet  TAKE 4 TABLETS BY MOUTH 1 HOUR PRIOR TO DENTAL APPOINTMENT     Ascorbic Acid (VITAMIN C PO) Take 500 mg by  mouth daily.     aspirin EC 81 MG tablet Take 1 tablet (81 mg total) by mouth daily. Swallow whole. 90 tablet 3   Budeson-Glycopyrrol-Formoterol (BREZTRI AEROSPHERE) 160-9-4.8 MCG/ACT AERO Inhale 2 puffs into the lungs 2 (two) times daily. 32.1 g 3   Cholecalciferol (VITAMIN D3) 2000 units TABS Take 2,000 Units by mouth daily.     dapagliflozin propanediol (FARXIGA) 10 MG TABS tablet Take 1 tablet (10 mg total) by mouth daily before breakfast. 90 tablet 3   doxycycline (VIBRA-TABS) 100 MG tablet Take 1 tablet (100 mg total) by mouth 2 (two) times daily. 14 tablet 0   ezetimibe (ZETIA) 10 MG tablet Take 1 tablet (10 mg total) by mouth daily. 90 tablet 3   furosemide (LASIX) 40 MG tablet Take 1 tablet (40 mg total) by mouth daily. 90 tablet 3   gabapentin (NEURONTIN) 300 MG capsule Take 900 mg by mouth daily.     ipratropium-albuterol (DUONEB) 0.5-2.5 (3) MG/3ML SOLN Take 3 mLs by nebulization every 6 (six) hours as needed. 75 mL 12   metoprolol succinate (TOPROL-XL) 100 MG 24 hr tablet Take 1 tablet (100 mg total) by mouth daily. Take with or immediately following a meal. 90 tablet 3   nitroGLYCERIN (NITROSTAT) 0.4 MG SL tablet Place 1 tablet (0.4 mg total) under the tongue every 5 (five) minutes as needed. 25 tablet 2   pantoprazole (PROTONIX) 40 MG tablet Take 1 tablet (40 mg total) by mouth daily. 90 tablet 3  potassium chloride (KLOR-CON M) 10 MEQ tablet Take 1 tablet (10 mEq total) by mouth 2 (two) times daily. 180 tablet 3   sacubitril-valsartan (ENTRESTO) 97-103 MG Take 1 tablet by mouth 2 (two) times daily. 180 tablet 3   Spacer/Aero-Holding Chambers (AEROCHAMBER MV) inhaler Use as instructed 1 each 0   traZODone (DESYREL) 50 MG tablet TAKE 1/2 TO 1 TABLET BY MOUTH AT BEDTIME AS NEEDED FOR SLEEP 90 tablet 0   vitamin B-12 (CYANOCOBALAMIN) 1000 MCG tablet Take 1 tablet (1,000 mcg total) by mouth daily.     No current facility-administered medications for this visit.    Allergies   Allergen Reactions   Penicillins Rash and Other (See Comments)    "Blistering rash" Has patient had a PCN reaction causing immediate rash, facial/tongue/throat swelling, SOB or lightheadedness with hypotension: Yes Has patient had a PCN reaction causing severe rash involving mucus membranes or skin necrosis: No Has patient had a PCN reaction that required hospitalization: No Has patient had a PCN reaction occurring within the last 10 years: No If all of the above answers are "NO", then may proceed with Cephalosporin use.    Amlodipine Swelling and Other (See Comments)    Peripheral edema   Effexor [Venlafaxine] Other (See Comments)    Tremors/shaking   Lisinopril Cough   Mevacor [Lovastatin] Other (See Comments)    Caused cataracts and elevated liver enzymes   Plavix [Clopidogrel]     Muscle aches    Vicodin [Hydrocodone-Acetaminophen] Nausea And Vomiting   Zocor [Simvastatin] Other (See Comments)    LFT increase with simvastatin and lovastatin    Oseltamivir Phosphate Itching and Rash    Past Medical History:  Diagnosis Date   CAP (community acquired pneumonia) 01/19/2018   History of arthritis    History of rheumatic fever    Hypercholesterolemia    Hypertension    Nocturia    PAF (paroxysmal atrial fibrillation) [facility]) January 2013   Placed on Pradaxa. Did not require cardioversion; spontaneously converted   Peripheral edema    Persistent atrial fibrillation ([place]) 01/21/2011   S/p ablation 12/2015 NSR up until his PCI Dec 2020 when he went into AF post PCI   Right bundle branch block    SOB (shortness of breath)    Tendonitis of elbow, left     There were no vitals taken for this visit.   HLD (hyperlipidemia) Patient with ASCVD and hyperlipidemia, LDL not at goal, currently at 77 on ezetimibe 10 mg daily.  Was not able to tolerate multiple statins due to a combination of myalgias and elevated LFT's.  Reviewed options for lowering LDL cholesterol, including  ezetimibe, PCSK-9 inhibitors, bempedoic acid and inclisiran.  Discussed mechanisms of action, dosing, side effects and potential decreases in LDL cholesterol.  Also reviewed cost information and potential options for patient assistance.  Answered all patient questions.  Based on this information, patient would prefer to start a PCSK9 inhibitor.     Tommy Medal PharmD CPP [facility] [address] [place], [place] [address] [phone]

## 2021-01-22 ENCOUNTER — Encounter: Payer: Self-pay | Admitting: Pharmacist Clinician (PhC)/ Clinical Pharmacy Specialist

## 2021-01-22 MED ORDER — REPATHA SURECLICK 140 MG/ML ~~LOC~~ SOAJ: 140.0000 mg | SUBCUTANEOUS | 4 refills | Status: DC

## 2021-01-31 ENCOUNTER — Other Ambulatory Visit: Payer: Self-pay

## 2021-01-31 MED ORDER — POTASSIUM CHLORIDE CRYS ER 10 MEQ PO TBCR: 10.0000 meq | EXTENDED_RELEASE_TABLET | Freq: Two times a day (BID) | ORAL | 3 refills | Status: DC

## 2021-02-04 ENCOUNTER — Other Ambulatory Visit: Payer: Self-pay | Admitting: Family Medicine

## 2021-02-04 DIAGNOSIS — D500 Iron deficiency anemia secondary to blood loss (chronic): Secondary | ICD-10-CM

## 2021-02-04 DIAGNOSIS — E7800 Pure hypercholesterolemia, unspecified: Secondary | ICD-10-CM

## 2021-02-07 ENCOUNTER — Other Ambulatory Visit: Payer: Self-pay

## 2021-02-07 ENCOUNTER — Other Ambulatory Visit (INDEPENDENT_AMBULATORY_CARE_PROVIDER_SITE_OTHER): Payer: Medicare Other

## 2021-02-07 DIAGNOSIS — E7800 Pure hypercholesterolemia, unspecified: Secondary | ICD-10-CM

## 2021-02-07 DIAGNOSIS — D500 Iron deficiency anemia secondary to blood loss (chronic): Secondary | ICD-10-CM

## 2021-02-07 LAB — CBC WITH DIFFERENTIAL/PLATELET
Basophils Absolute: 0 10*3/uL (ref 0.0–0.1)
Basophils Relative: 0.4 % (ref 0.0–3.0)
Eosinophils Absolute: 0.2 10*3/uL (ref 0.0–0.7)
Eosinophils Relative: 2.6 % (ref 0.0–5.0)
HCT: 50.3 % (ref 39.0–52.0)
Hemoglobin: 17.2 g/dL — ABNORMAL HIGH (ref 13.0–17.0)
Lymphocytes Relative: 35.1 % (ref 12.0–46.0)
Lymphs Abs: 2.3 10*3/uL (ref 0.7–4.0)
MCHC: 34.2 g/dL (ref 30.0–36.0)
MCV: 101.8 fl — ABNORMAL HIGH (ref 78.0–100.0)
Monocytes Absolute: 0.6 10*3/uL (ref 0.1–1.0)
Monocytes Relative: 9.4 % (ref 3.0–12.0)
Neutro Abs: 3.4 10*3/uL (ref 1.4–7.7)
Neutrophils Relative %: 52.5 % (ref 43.0–77.0)
Platelets: 119 10*3/uL — ABNORMAL LOW (ref 150.0–400.0)
RBC: 4.94 Mil/uL (ref 4.22–5.81)
RDW: 13.6 % (ref 11.5–15.5)
WBC: 6.4 10*3/uL (ref 4.0–10.5)

## 2021-02-07 LAB — COMPREHENSIVE METABOLIC PANEL
ALT: 15 U/L (ref 0–53)
AST: 19 U/L (ref 0–37)
Albumin: 4.1 g/dL (ref 3.5–5.2)
Alkaline Phosphatase: 58 U/L (ref 39–117)
BUN: 16 mg/dL (ref 6–23)
CO2: 32 mEq/L (ref 19–32)
Calcium: 9.1 mg/dL (ref 8.4–10.5)
Chloride: 96 mEq/L (ref 96–112)
Creatinine, Ser: 1.1 mg/dL (ref 0.40–1.50)
GFR: 61.69 mL/min (ref >60.00)
Glucose, Bld: 100 mg/dL — ABNORMAL HIGH (ref 70–99)
Potassium: 4.2 mEq/L (ref 3.5–5.1)
Sodium: 135 mEq/L (ref 135–145)
Total Bilirubin: 1 mg/dL (ref 0.2–1.2)
Total Protein: 6.4 g/dL (ref 6.0–8.3)

## 2021-02-07 LAB — IBC PANEL
Iron: 147 ug/dL (ref 42–165)
Saturation Ratios: 38.2 % (ref 20.0–50.0)
TIBC: 385 ug/dL (ref 250.0–450.0)
Transferrin: 275 mg/dL (ref 212.0–360.0)

## 2021-02-07 LAB — FOLATE: Folate: 12.9 ng/mL (ref >5.9)

## 2021-02-07 LAB — LIPID PANEL
Cholesterol: 138 mg/dL (ref 0–200)
HDL: 48.3 mg/dL (ref >39.00)
LDL Cholesterol: 68 mg/dL (ref 0–99)
NonHDL: 89.83
Total CHOL/HDL Ratio: 3
Triglycerides: 109 mg/dL (ref 0.0–149.0)
VLDL: 21.8 mg/dL (ref 0.0–40.0)

## 2021-02-08 ENCOUNTER — Ambulatory Visit: Payer: Medicare Other

## 2021-02-12 ENCOUNTER — Ambulatory Visit: Payer: Medicare Other | Admitting: Family Medicine

## 2021-02-12 ENCOUNTER — Encounter: Payer: Self-pay | Admitting: Family Medicine

## 2021-02-12 ENCOUNTER — Other Ambulatory Visit: Payer: Self-pay

## 2021-02-12 VITALS — BP 102/62 | HR 60 | Temp 98.2°F | Ht 71.0 in | Wt 205.0 lb

## 2021-02-12 DIAGNOSIS — D751 Secondary polycythemia: Secondary | ICD-10-CM | POA: Diagnosis not present

## 2021-02-12 DIAGNOSIS — D7589 Other specified diseases of blood and blood-forming organs: Secondary | ICD-10-CM | POA: Insufficient documentation

## 2021-02-12 DIAGNOSIS — R5383 Other fatigue: Secondary | ICD-10-CM

## 2021-02-12 DIAGNOSIS — D696 Thrombocytopenia, unspecified: Secondary | ICD-10-CM | POA: Diagnosis not present

## 2021-02-12 DIAGNOSIS — R1310 Dysphagia, unspecified: Secondary | ICD-10-CM | POA: Diagnosis not present

## 2021-02-12 DIAGNOSIS — G609 Hereditary and idiopathic neuropathy, unspecified: Secondary | ICD-10-CM

## 2021-02-12 DIAGNOSIS — E663 Overweight: Secondary | ICD-10-CM

## 2021-02-12 DIAGNOSIS — R634 Abnormal weight loss: Secondary | ICD-10-CM

## 2021-02-12 DIAGNOSIS — J449 Chronic obstructive pulmonary disease, unspecified: Secondary | ICD-10-CM

## 2021-02-12 DIAGNOSIS — E7800 Pure hypercholesterolemia, unspecified: Secondary | ICD-10-CM

## 2021-02-12 DIAGNOSIS — D500 Iron deficiency anemia secondary to blood loss (chronic): Secondary | ICD-10-CM

## 2021-02-12 DIAGNOSIS — R1013 Epigastric pain: Secondary | ICD-10-CM

## 2021-02-12 DIAGNOSIS — R208 Other disturbances of skin sensation: Secondary | ICD-10-CM

## 2021-02-12 NOTE — Assessment & Plan Note (Signed)
Ongoing. Update B1 and B12.

## 2021-02-12 NOTE — Assessment & Plan Note (Signed)
New - previously anemic.  No h/o smoking, no h/o OSA.  Mild alcohol use long-term.  Update CBC, periph smear, EPO levels.

## 2021-02-12 NOTE — Assessment & Plan Note (Signed)
Appreciate cards/pharmacy team care - now on repatha and seems to be tolerating well.

## 2021-02-12 NOTE — Assessment & Plan Note (Signed)
Recent folate normal. Check b12 and b1 levels.

## 2021-02-12 NOTE — Assessment & Plan Note (Signed)
With weight loss, no other red flags.  Recent oral thrush s/p treatment last month.  Actually symptoms are improving on their own. Will continue to monitor.

## 2021-02-12 NOTE — Assessment & Plan Note (Signed)
Continues Breztri through pulm.

## 2021-02-12 NOTE — Assessment & Plan Note (Addendum)
Recent iron panel normal. Update iron stores.

## 2021-02-12 NOTE — Assessment & Plan Note (Signed)
New over the last few weeks-months. Check labwork to further evaluate today.

## 2021-02-12 NOTE — Patient Instructions (Addendum)
Labs today  We will order abdominal ultrasound as well.  Let me know if trouble swallowing recurs to refer you back to GI.  Keep Friday physical appointment.

## 2021-02-12 NOTE — Assessment & Plan Note (Signed)
Isolated low plt once previously , which normalized. Now again mildly low.  Will repeat CBC today along with periph smear.  ?ITP

## 2021-02-12 NOTE — Assessment & Plan Note (Signed)
Weight loss noted, unintentional.  In setting of recently starting farxiga.  Will continue to monitor.

## 2021-02-12 NOTE — Progress Notes (Signed)
Patient ID: Theodore [name], male    DOB: 02-08-1935, 87 y.o.   MRN: [id number]  This visit was conducted in person.  BP 102/62    Pulse 60    Temp 98.2 F (36.8 C) (Temporal)    Ht 5\' 11"  (1.803 m)    Wt 205 lb (93 kg)    SpO2 95%    BMI 28.59 kg/m    CC: dysphagia, weight loss Subjective:   HPI: Theodore [name] is a 87 y.o. male presenting on 02/12/2021 for trouble swallowing (Thick foods/liquids x 3-4 weeks ) and Weight Loss   Recent oral thrush (12/2020) attributed to breztri through pulmonologist - treated with nystatin. He also held breztri for a few weeks.  Recent dysphagia associated with thrush - this may be improving over the last few days. Notes more trouble with pills, sips of water, symptoms worse in the mornings. No trouble with solids.  Over the last few months notes progressively worsening fatigue.  No abdominal pain, nausea, vomiting, bowel changes, early satiety, boring pain to back, new dyspnea, palpitations or chest pain.  Alcohol - drinks 2 natural lights/day (light beer).   14 lb weight loss in the past year - hasn't been trying.   In the past year has closely been followed by cardiology - echo earlier this year showed worsening heart function with global hypokinesis - started on entresto and farxiga with benefit.   Recently seen by lipid clinic with planned commencement of PCSK-9i Repatha in addition to ezetimibe. H/o statin intolerances (myalgias and transaminitis), most recently on atorva. Progressive peripheral neuropathy symptoms - actually better off atorvastatin.   Ongoing burning feet - better off atorvastatin but still persists.   Recent physical labs showed new thrombocytopenia and polycythemia (previously anemic).  Non smoker.  No known h/o OSA - doesn't snore, no witnessed apnea, feels overall rested when he awakens, mild daytime somnolence.  Not on oral iron.  He received iron infusion 02/2020 - with significant improvement in energy at that time.  Was on xarelto and plavix at that time - now of both blood thinners.     Relevant past medical, surgical, family and social history reviewed and updated as indicated. Interim medical history since our last visit reviewed. Allergies and medications reviewed and updated. Outpatient Medications Prior to Visit  Medication Sig Dispense Refill   acetaminophen (TYLENOL) 500 MG tablet Take 1 tablet (500 mg total) by mouth 3 (three) times daily as needed. 30 tablet 0   amoxicillin (AMOXIL) 500 MG tablet amoxicillin 500 mg tablet  TAKE 4 TABLETS BY MOUTH 1 HOUR PRIOR TO DENTAL APPOINTMENT     Ascorbic Acid (VITAMIN C PO) Take 500 mg by mouth daily.     aspirin EC 81 MG tablet Take 1 tablet (81 mg total) by mouth daily. Swallow whole. 90 tablet 3   Budeson-Glycopyrrol-Formoterol (BREZTRI AEROSPHERE) 160-9-4.8 MCG/ACT AERO Inhale 2 puffs into the lungs 2 (two) times daily. 32.1 g 3   Cholecalciferol (VITAMIN D3) 2000 units TABS Take 2,000 Units by mouth daily.     dapagliflozin propanediol (FARXIGA) 10 MG TABS tablet Take 1 tablet (10 mg total) by mouth daily before breakfast. 90 tablet 3   Evolocumab (REPATHA SURECLICK) 810 MG/ML SOAJ Inject 140 mg into the skin every 14 (fourteen) days. 6 mL 4   ezetimibe (ZETIA) 10 MG tablet Take 1 tablet (10 mg total) by mouth daily. 90 tablet 3   furosemide (LASIX) 40 MG tablet Take 1 tablet (40  mg total) by mouth daily. 90 tablet 3   gabapentin (NEURONTIN) 300 MG capsule Take 900 mg by mouth daily.     ipratropium-albuterol (DUONEB) 0.5-2.5 (3) MG/3ML SOLN Take 3 mLs by nebulization every 6 (six) hours as needed. 75 mL 12   nitroGLYCERIN (NITROSTAT) 0.4 MG SL tablet Place 1 tablet (0.4 mg total) under the tongue every 5 (five) minutes as needed. 25 tablet 2   pantoprazole (PROTONIX) 40 MG tablet Take 1 tablet (40 mg total) by mouth daily. 90 tablet 3   potassium chloride (KLOR-CON M) 10 MEQ tablet Take 1 tablet (10 mEq total) by mouth 2 (two) times daily. 180 tablet  3   sacubitril-valsartan (ENTRESTO) 97-103 MG Take 1 tablet by mouth 2 (two) times daily. 180 tablet 3   Spacer/Aero-Holding Chambers (AEROCHAMBER MV) inhaler Use as instructed 1 each 0   traZODone (DESYREL) 50 MG tablet TAKE 1/2 TO 1 TABLET BY MOUTH AT BEDTIME AS NEEDED FOR SLEEP 90 tablet 0   vitamin B-12 (CYANOCOBALAMIN) 1000 MCG tablet Take 1 tablet (1,000 mcg total) by mouth daily.     metoprolol succinate (TOPROL-XL) 100 MG 24 hr tablet Take 1 tablet (100 mg total) by mouth daily. Take with or immediately following a meal. 90 tablet 3   doxycycline (VIBRA-TABS) 100 MG tablet Take 1 tablet (100 mg total) by mouth 2 (two) times daily. 14 tablet 0   No facility-administered medications prior to visit.     Per HPI unless specifically indicated in ROS section below Review of Systems  Objective:  BP 102/62    Pulse 60    Temp 98.2 F (36.8 C) (Temporal)    Ht 5\' 11"  (1.803 m)    Wt 205 lb (93 kg)    SpO2 95%    BMI 28.59 kg/m   Wt Readings from Last 3 Encounters:  02/12/21 205 lb (93 kg)  01/08/21 212 lb (96.2 kg)  12/26/20 212 lb (96.2 kg)      Physical Exam Vitals and nursing note reviewed.  Constitutional:      Appearance: Normal appearance. He is not ill-appearing.  HENT:     Mouth/Throat:     Mouth: Mucous membranes are moist.     Pharynx: Oropharynx is clear. No oropharyngeal exudate or posterior oropharyngeal erythema.  Eyes:     Extraocular Movements: Extraocular movements intact.     Pupils: Pupils are equal, round, and reactive to light.  Neck:     Thyroid: No thyroid mass or thyromegaly.  Cardiovascular:     Rate and Rhythm: Normal rate and regular rhythm.     Pulses: Normal pulses.     Heart sounds: Normal heart sounds. No murmur heard. Pulmonary:     Effort: Pulmonary effort is normal. No respiratory distress.     Breath sounds: Normal breath sounds. No wheezing, rhonchi or rales.  Abdominal:     General: Bowel sounds are normal. There is no distension.      Palpations: Abdomen is soft. There is no mass.     Tenderness: There is abdominal tenderness (mild) in the epigastric area. There is no guarding or rebound. Negative signs include Murphy's sign.     Hernia: No hernia is present.  Musculoskeletal:     Right lower leg: No edema.     Left lower leg: No edema.  Skin:    General: Skin is warm and dry.     Findings: No erythema or rash.  Neurological:     Mental Status: He is  alert.  Psychiatric:        Mood and Affect: Mood normal.        Behavior: Behavior normal.      Results for orders placed or performed in visit on 02/07/21  Lipid panel  Result Value Ref Range   Cholesterol 138 0 - 200 mg/dL   Triglycerides 109.0 0.0 - 149.0 mg/dL   HDL 48.30 >39.00 mg/dL   VLDL 21.8 0.0 - 40.0 mg/dL   LDL Cholesterol 68 0 - 99 mg/dL   Total CHOL/HDL Ratio 3    NonHDL 89.83   Folate  Result Value Ref Range   Folate 12.9 >5.9 ng/mL  IBC panel  Result Value Ref Range   Iron 147 42 - 165 ug/dL   Transferrin 275.0 212.0 - 360.0 mg/dL   Saturation Ratios 38.2 20.0 - 50.0 %   TIBC 385.0 250.0 - 450.0 mcg/dL  CBC with Differential/Platelet  Result Value Ref Range   WBC 6.4 4.0 - 10.5 K/uL   RBC 4.94 4.22 - 5.81 Mil/uL   Hemoglobin 17.2 (H) 13.0 - 17.0 g/dL   HCT 50.3 39.0 - 52.0 %   MCV 101.8 (H) 78.0 - 100.0 fl   MCHC 34.2 30.0 - 36.0 g/dL   RDW 13.6 11.5 - 15.5 %   Platelets 119.0 (L) 150.0 - 400.0 K/uL   Neutrophils Relative % 52.5 43.0 - 77.0 %   Lymphocytes Relative 35.1 12.0 - 46.0 %   Monocytes Relative 9.4 3.0 - 12.0 %   Eosinophils Relative 2.6 0.0 - 5.0 %   Basophils Relative 0.4 0.0 - 3.0 %   Neutro Abs 3.4 1.4 - 7.7 K/uL   Lymphs Abs 2.3 0.7 - 4.0 K/uL   Monocytes Absolute 0.6 0.1 - 1.0 K/uL   Eosinophils Absolute 0.2 0.0 - 0.7 K/uL   Basophils Absolute 0.0 0.0 - 0.1 K/uL  Comprehensive metabolic panel  Result Value Ref Range   Sodium 135 135 - 145 mEq/L   Potassium 4.2 3.5 - 5.1 mEq/L   Chloride 96 96 - 112 mEq/L    CO2 32 19 - 32 mEq/L   Glucose, Bld 100 (H) 70 - 99 mg/dL   BUN 16 6 - 23 mg/dL   Creatinine, Ser 1.10 0.40 - 1.50 mg/dL   Total Bilirubin 1.0 0.2 - 1.2 mg/dL   Alkaline Phosphatase 58 39 - 117 U/L   AST 19 0 - 37 U/L   ALT 15 0 - 53 U/L   Total Protein 6.4 6.0 - 8.3 g/dL   Albumin 4.1 3.5 - 5.2 g/dL   GFR 61.69 >60.00 mL/min   Calcium 9.1 8.4 - 10.5 mg/dL   Lab Results  Component Value Date   TSH 1.70 02/06/2020    Assessment & Plan:  This visit occurred during the SARS-CoV-2 public health emergency.  Safety protocols were in place, including screening questions prior to the visit, additional usage of staff PPE, and extensive cleaning of exam room while observing appropriate contact time as indicated for disinfecting solutions.   Problem List Items Addressed This Visit     COPD mixed type ([place]) (Chronic)    Continues Breztri through pulm.       HLD (hyperlipidemia)    Appreciate cards/pharmacy team care - now on repatha and seems to be tolerating well.       Thrombocytopenia ([place]) - Primary    Isolated low plt once previously , which normalized. Now again mildly low.  Will repeat CBC today along with periph smear.  ?  ITP      Relevant Orders   Vitamin B12   Pathologist smear review   CBC with Differential/Platelet   Fatigue    New over the last few weeks-months. Check labwork to further evaluate today.       Relevant Orders   TSH   Overweight (BMI 25.0-29.9)    Weight loss noted, unintentional.  In setting of recently starting farxiga.  Will continue to monitor.       Burning sensation of foot    Ongoing. Update B1 and B12.       Peripheral neuropathy   Iron deficiency anemia due to chronic blood loss    Recent iron panel normal. Update iron stores.       Polycythemia    New - previously anemic.  No h/o smoking, no h/o OSA.  Mild alcohol use long-term.  Update CBC, periph smear, EPO levels.      Relevant Orders   Ferritin   Vitamin B12   Vitamin  B1   Pathologist smear review   Erythropoietin   CBC with Differential/Platelet   Macrocytosis    Recent folate normal. Check b12 and b1 levels.       Relevant Orders   Vitamin B12   Vitamin B1   Pathologist smear review     No orders of the defined types were placed in this encounter.  Orders Placed This Encounter  Procedures   Ferritin   Vitamin B12   Vitamin B1   Pathologist smear review   Erythropoietin   TSH   CBC with Differential/Platelet     Patient Instructions  Labs today  We will order abdominal ultrasound as well.  Let me know if trouble swallowing recurs to refer you back to GI.  Keep Friday physical appointment.   Follow up plan: Return if symptoms worsen or fail to improve.  [name], MD

## 2021-02-13 LAB — CBC WITH DIFFERENTIAL/PLATELET
Absolute Monocytes: 561 cells/uL (ref 200–950)
Basophils Absolute: 21 cells/uL (ref 0–200)
Basophils Relative: 0.3 %
Eosinophils Absolute: 78 cells/uL (ref 15–500)
Eosinophils Relative: 1.1 %
HCT: 50.4 % — ABNORMAL HIGH (ref 38.5–50.0)
Hemoglobin: 17.5 g/dL — ABNORMAL HIGH (ref 13.2–17.1)
Lymphs Abs: 2123 cells/uL (ref 850–3900)
MCH: 35.3 pg — ABNORMAL HIGH (ref 27.0–33.0)
MCHC: 34.7 g/dL (ref 32.0–36.0)
MCV: 101.6 fL — ABNORMAL HIGH (ref 80.0–100.0)
MPV: 10.1 fL (ref 7.5–12.5)
Monocytes Relative: 7.9 %
Neutro Abs: 4317 cells/uL (ref 1500–7800)
Neutrophils Relative %: 60.8 %
Platelets: 134 10*3/uL — ABNORMAL LOW (ref 140–400)
RBC: 4.96 10*6/uL (ref 4.20–5.80)
RDW: 13.4 % (ref 11.0–15.0)
Total Lymphocyte: 29.9 %
WBC: 7.1 10*3/uL (ref 3.8–10.8)

## 2021-02-13 LAB — FERRITIN: Ferritin: 40.2 ng/mL (ref 22.0–322.0)

## 2021-02-13 LAB — VITAMIN B12: Vitamin B-12: >1550 pg/mL — ABNORMAL HIGH (ref 211–911)

## 2021-02-13 LAB — PATHOLOGIST SMEAR REVIEW

## 2021-02-13 LAB — TSH: TSH: 1.87 u[IU]/mL (ref 0.35–5.50)

## 2021-02-14 LAB — ERYTHROPOIETIN: Erythropoietin: 26.2 m[IU]/mL — ABNORMAL HIGH (ref 2.6–18.5)

## 2021-02-15 ENCOUNTER — Encounter: Payer: Self-pay | Admitting: Family Medicine

## 2021-02-15 ENCOUNTER — Other Ambulatory Visit: Payer: Self-pay

## 2021-02-15 ENCOUNTER — Ambulatory Visit (INDEPENDENT_AMBULATORY_CARE_PROVIDER_SITE_OTHER): Payer: Medicare Other | Admitting: Family Medicine

## 2021-02-15 ENCOUNTER — Encounter (HOSPITAL_BASED_OUTPATIENT_CLINIC_OR_DEPARTMENT_OTHER): Payer: Self-pay | Admitting: Cardiovascular Disease

## 2021-02-15 VITALS — BP 100/62 | HR 72 | Temp 97.4°F | Ht 70.0 in | Wt 204.0 lb

## 2021-02-15 DIAGNOSIS — Z9861 Coronary angioplasty status: Secondary | ICD-10-CM

## 2021-02-15 DIAGNOSIS — R5383 Other fatigue: Secondary | ICD-10-CM

## 2021-02-15 DIAGNOSIS — I10 Essential (primary) hypertension: Secondary | ICD-10-CM

## 2021-02-15 DIAGNOSIS — R634 Abnormal weight loss: Secondary | ICD-10-CM | POA: Diagnosis not present

## 2021-02-15 DIAGNOSIS — Z0001 Encounter for general adult medical examination with abnormal findings: Secondary | ICD-10-CM | POA: Diagnosis not present

## 2021-02-15 DIAGNOSIS — D696 Thrombocytopenia, unspecified: Secondary | ICD-10-CM

## 2021-02-15 DIAGNOSIS — I2510 Atherosclerotic heart disease of native coronary artery without angina pectoris: Secondary | ICD-10-CM

## 2021-02-15 DIAGNOSIS — I700 Atherosclerosis of aorta: Secondary | ICD-10-CM

## 2021-02-15 DIAGNOSIS — R1310 Dysphagia, unspecified: Secondary | ICD-10-CM

## 2021-02-15 DIAGNOSIS — I428 Other cardiomyopathies: Secondary | ICD-10-CM

## 2021-02-15 DIAGNOSIS — I4819 Other persistent atrial fibrillation: Secondary | ICD-10-CM | POA: Diagnosis not present

## 2021-02-15 DIAGNOSIS — D751 Secondary polycythemia: Secondary | ICD-10-CM

## 2021-02-15 DIAGNOSIS — G609 Hereditary and idiopathic neuropathy, unspecified: Secondary | ICD-10-CM

## 2021-02-15 DIAGNOSIS — E7800 Pure hypercholesterolemia, unspecified: Secondary | ICD-10-CM | POA: Diagnosis not present

## 2021-02-15 DIAGNOSIS — D7589 Other specified diseases of blood and blood-forming organs: Secondary | ICD-10-CM

## 2021-02-15 DIAGNOSIS — J449 Chronic obstructive pulmonary disease, unspecified: Secondary | ICD-10-CM | POA: Diagnosis not present

## 2021-02-15 MED ORDER — CYANOCOBALAMIN 500 MCG PO TABS: 500.0000 ug | ORAL_TABLET | ORAL | Status: DC

## 2021-02-15 NOTE — Assessment & Plan Note (Signed)
Continue gabapentin. Some improvement noted since stopping statin.

## 2021-02-15 NOTE — Progress Notes (Signed)
Patient ID: Theodore [name], male    DOB: 1934/03/17, 87 y.o.   MRN: [id number]  This visit was conducted in person.  BP 100/62    Pulse 72    Temp (!) 97.4 F (36.3 C) (Temporal)    Ht 5\' 10"  (1.778 m)    Wt 204 lb (92.5 kg)    SpO2 95%    BMI 29.27 kg/m   BP Readings from Last 3 Encounters:  02/15/21 100/62  02/12/21 102/62  12/26/20 130/82    CC: CPE Subjective:   HPI: Theodore [name] is a 87 y.o. male presenting on 02/15/2021 for Annual Exam   Saw health advisor 12/2020 for medicare wellness visit. Note reviewed.   No results found.  Flowsheet Row Clinical Support from 01/08/2021 in [facility] at [place]  PHQ-2 Total Score 0       Fall Risk  01/08/2021 08/09/2019 02/14/2019 08/06/2018 07/23/2017  Falls in the past year? 0 0 0 0 No  Number falls in past yr: 0 0 - - -  Injury with Fall? 0 0 - - -  Risk for fall due to : No Fall Risks Medication side effect - - -  Follow up Falls prevention discussed Falls evaluation completed;Falls prevention discussed - - -    Recent pill dysphagia with weight loss - initially attributed to oral thrush s/p treatment 12/2020. This continues improving each day, notes more trouble in the mornings. Stays worried he's taking too much fluid pills as he notes weight loss started when he started farxiga and entresto 10/2020.   Saw derm yesterday - had multiple spots burned (Dr Pearline Cables at [place]).   Newly noted polycythemia - denies OSA symptoms, no smoking history, no significant alcohol use.   Preventative: COLONOSCOPY WITH PROPOFOL 12/02/2018 - TAx2, HP, diverticulosis [name], [name], MD)  Also had EGD 11/2018 - medium HH [name]) Prostate cancer screening - always normal saw urology. Age out Lung cancer screening - non smoker Flu shot yearly  [place] 02/2019, 03/2019, booster 10/2019, bivalent 10/2020 Tetanus shot unsure  Pneumovax 2011, prevnar-13 2016  zostavax - ~2014 shingrix - 07/2019, 09/2019  Advanced directive -  Advanced directives: scanned and in chart 07/2016. Wants wife Dorthy Hustead to be HCPOA then children. Grants HCPOA discretion for medical decisions  Seat belt use discussed  Sunscreen use discussed. No changing moles on skin. Sees derm regularly.  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 Interim medical history since our last visit reviewed. Allergies and medications reviewed and updated. Outpatient Medications Prior to Visit  Medication Sig Dispense Refill   acetaminophen (TYLENOL) 500 MG tablet Take 1 tablet (500 mg total) by mouth 3 (three) times daily as needed. 30 tablet 0   amoxicillin (AMOXIL) 500 MG tablet amoxicillin 500 mg tablet  TAKE 4 TABLETS BY MOUTH 1 HOUR PRIOR TO DENTAL APPOINTMENT     Ascorbic Acid (VITAMIN C PO) Take 500 mg by mouth daily.     aspirin EC 81 MG tablet Take 1 tablet (81 mg total) by mouth daily. Swallow whole. 90 tablet 3   Budeson-Glycopyrrol-Formoterol (BREZTRI AEROSPHERE) 160-9-4.8  MCG/ACT AERO Inhale 2 puffs into the lungs 2 (two) times daily. 32.1 g 3   Cholecalciferol (VITAMIN D3) 2000 units TABS Take 2,000 Units by mouth daily.     dapagliflozin propanediol (FARXIGA) 10 MG TABS tablet Take 1 tablet (10 mg total) by mouth daily before breakfast. 90 tablet 3   Evolocumab (REPATHA SURECLICK) 350 MG/ML SOAJ Inject 140 mg into the skin every 14 (fourteen) days. 6 mL 4   ezetimibe (ZETIA) 10 MG tablet Take 1 tablet (10 mg total) by mouth daily. 90 tablet 3   furosemide (LASIX) 40 MG tablet Take 1 tablet (40 mg total) by mouth daily. 90 tablet 3   gabapentin (NEURONTIN) 300 MG capsule Take 900 mg by mouth daily.      ipratropium-albuterol (DUONEB) 0.5-2.5 (3) MG/3ML SOLN Take 3 mLs by nebulization every 6 (six) hours as needed. 75 mL 12   nitroGLYCERIN (NITROSTAT) 0.4 MG SL tablet Place 1 tablet (0.4 mg total) under the tongue every 5 (five) minutes as needed. 25 tablet 2   pantoprazole (PROTONIX) 40 MG tablet Take 1 tablet (40 mg total) by mouth daily. 90 tablet 3   potassium chloride (KLOR-CON M) 10 MEQ tablet Take 1 tablet (10 mEq total) by mouth 2 (two) times daily. 180 tablet 3   sacubitril-valsartan (ENTRESTO) 97-103 MG Take 1 tablet by mouth 2 (two) times daily. 180 tablet 3   Spacer/Aero-Holding Chambers (AEROCHAMBER MV) inhaler Use as instructed 1 each 0   traZODone (DESYREL) 50 MG tablet TAKE 1/2 TO 1 TABLET BY MOUTH AT BEDTIME AS NEEDED FOR SLEEP 90 tablet 0   vitamin B-12 (CYANOCOBALAMIN) 1000 MCG tablet Take 1 tablet (1,000 mcg total) by mouth daily.     metoprolol succinate (TOPROL-XL) 100 MG 24 hr tablet Take 1 tablet (100 mg total) by mouth daily. Take with or immediately following a meal. 90 tablet 3   No facility-administered medications prior to visit.     Per HPI unless specifically indicated in ROS section below Review of Systems  Constitutional:  Positive for fatigue and unexpected weight change (loss). Negative for activity change, appetite change, chills and fever.  HENT:  Negative for hearing loss.   Eyes:  Negative for visual disturbance.  Respiratory:  Negative for cough, chest tightness, shortness of breath and wheezing.   Cardiovascular:  Negative for chest pain, palpitations and leg swelling.  Gastrointestinal:  Negative for abdominal distention, abdominal pain, blood in stool, constipation, diarrhea, nausea and vomiting.  Genitourinary:  Negative for difficulty urinating and hematuria.  Musculoskeletal:  Negative for arthralgias, myalgias and neck pain.  Skin:  Negative for rash.  Neurological:  Negative for dizziness, seizures, syncope and headaches.  Hematological:   Negative for adenopathy. Bruises/bleeds easily.  Psychiatric/Behavioral:  Negative for dysphoric mood. The patient is not nervous/anxious.    Objective:  BP 100/62    Pulse 72    Temp (!) 97.4 F (36.3 C) (Temporal)    Ht 5\' 10"  (1.778 m)    Wt 204 lb (92.5 kg)    SpO2 95%    BMI 29.27 kg/m   Wt Readings from Last 3 Encounters:  02/15/21 204 lb (92.5 kg)  02/12/21 205 lb (93 kg)  01/08/21 212 lb (96.2 kg)      Physical Exam Vitals and nursing note reviewed.  Constitutional:      General: He is not in acute distress.    Appearance: Normal appearance. He is well-developed. He is not ill-appearing.  HENT:     Head:  Normocephalic and atraumatic.     Right Ear: Hearing, tympanic membrane, ear canal and external ear normal.     Left Ear: Hearing, tympanic membrane, ear canal and external ear normal.  Eyes:     General: No scleral icterus.    Extraocular Movements: Extraocular movements intact.     Conjunctiva/sclera: Conjunctivae normal.     Pupils: Pupils are equal, round, and reactive to light.  Neck:     Thyroid: No thyroid mass or thyromegaly.     Vascular: No carotid bruit.  Cardiovascular:     Rate and Rhythm: Normal rate and regular rhythm.     Pulses: Normal pulses.          Radial pulses are 2+ on the right side and 2+ on the left side.     Heart sounds: Normal heart sounds. No murmur heard. Pulmonary:     Effort: Pulmonary effort is normal. No respiratory distress.     Breath sounds: Normal breath sounds. No wheezing, rhonchi or rales.  Abdominal:     General: Bowel sounds are normal. There is no distension.     Palpations: Abdomen is soft. There is no mass.     Tenderness: There is no abdominal tenderness. There is no guarding or rebound.     Hernia: No hernia is present.  Musculoskeletal:        General: Normal range of motion.     Cervical back: Normal range of motion and neck supple.     Right lower leg: No edema.     Left lower leg: No edema.  Lymphadenopathy:      Cervical: No cervical adenopathy.  Skin:    General: Skin is warm and dry.     Findings: No rash.  Neurological:     General: No focal deficit present.     Mental Status: He is alert and oriented to person, place, and time.  Psychiatric:        Mood and Affect: Mood normal.        Behavior: Behavior normal.        Thought Content: Thought content normal.        Judgment: Judgment normal.      Results for orders placed or performed in visit on 02/12/21  Ferritin  Result Value Ref Range   Ferritin 40.2 22.0 - 322.0 ng/mL  Vitamin B12  Result Value Ref Range   Vitamin B-12 >1550 (H) 211 - 911 pg/mL  Pathologist smear review  Result Value Ref Range   Path Review    Erythropoietin  Result Value Ref Range   Erythropoietin 26.2 (H) 2.6 - 18.5 mIU/mL  TSH  Result Value Ref Range   TSH 1.87 0.35 - 5.50 uIU/mL  CBC with Differential/Platelet  Result Value Ref Range   WBC 7.1 3.8 - 10.8 Thousand/uL   RBC 4.96 4.20 - 5.80 Million/uL   Hemoglobin 17.5 (H) 13.2 - 17.1 g/dL   HCT 50.4 (H) 38.5 - 50.0 %   MCV 101.6 (H) 80.0 - 100.0 fL   MCH 35.3 (H) 27.0 - 33.0 pg   MCHC 34.7 32.0 - 36.0 g/dL   RDW 13.4 11.0 - 15.0 %   Platelets 134 (L) 140 - 400 Thousand/uL   MPV 10.1 7.5 - 12.5 fL   Neutro Abs 4,317 1,500 - 7,800 cells/uL   Lymphs Abs 2,123 850 - 3,900 cells/uL   Absolute Monocytes 561 200 - 950 cells/uL   Eosinophils Absolute 78 15 - 500 cells/uL   Basophils  Absolute 21 0 - 200 cells/uL   Neutrophils Relative % 60.8 %   Total Lymphocyte 29.9 %   Monocytes Relative 7.9 %   Eosinophils Relative 1.1 %   Basophils Relative 0.3 %    Assessment & Plan:  This visit occurred during the SARS-CoV-2 public health emergency.  Safety protocols were in place, including screening questions prior to the visit, additional usage of staff PPE, and extensive cleaning of exam room while observing appropriate contact time as indicated for disinfecting solutions.   Problem List Items  Addressed This Visit     Essential hypertension (Chronic)    Chronic, stable to low readings on current regimen.       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 Discussed healthy diet and lifestyle.       HLD (hyperlipidemia)    Now followed by cardiology lipid clinic, with recent commencement of repatha. H/o statin intolerance.  The ASCVD Risk score (Arnett DK, et al., 2019) failed to calculate for the following reasons:   The 2019 ASCVD risk score is only valid for ages 32 to 21       Persistent atrial fibrillation (HCC)    Followed by EP, off anticoagulant at this time in h/o GI bleed.       Thrombocytopenia ([facility])    plt count remaining low however with some improvement noted. Recent peripheral smear reassuring. He has previously seen hematology. Will continue to monitor.       Fatigue    Ongoing fatigue/malaise.       CAD S/P percutaneous coronary angioplasty   Nonischemic cardiomyopathy [facility])   Thoracic aorta atherosclerosis (H[facility])    Continue repatha and aspirin.       Peripheral neuropathy    Continue gabapentin. Some improvement noted since stopping statin.       Polycythemia    New (previously anemic), elevated EPO levels suggest secondary cause however he denies OSA symptoms, no smoking history, denies significant alcohol intake. Will continue to monitor with repeat labs in 3 months.  Will check JAK2 next labs and consider heme referral if ongoing polycythemia.  Recent CXR 12/2020 reviewed.       Relevant Orders   CBC with Differential/Platelet   Comprehensive metabolic panel   JAK2 [id number] Qt, Rfx E12-15   Macrocytosis    B12 levels high - will hold b12 for 2 wks then restart at lower dose. Recent folate levels normal.       Dysphagia    This continues  improving each day.       Unintended weight loss    He remains concerned about ongoing weight loss since he's started farxiga - I asked him to check with cards about diuretic dosing.         Meds ordered this encounter  Medications   vitamin B-12 (CYANOCOBALAMIN) 500 MCG tablet    Sig: Take 1 tablet (500 mcg total) by mouth every Monday, Wednesday, and Friday.   Orders Placed This Encounter  Procedures   CBC with Differential/Platelet    Standing Status:   Future    Standing Expiration Date:   02/15/2022   Comprehensive metabolic panel    Standing Status:   Future    Standing Expiration Date:   02/15/2022   JAK2 [id number] Qt, Rfx  E12-15    Standing Status:   Future    Standing Expiration Date:   02/16/2022     Patient instructions: Hold B12 for 2 weeks then start 570mcg Monday, Wednesday, Fridays. Touch base with cardiology about medicine doses (especially water pill).  Schedule lab visit in 3 months to recheck blood counts.  Let me know sooner if ongoing weight loss noted.  Return as needed or in 6 months for follow up visit.   Follow up plan: Return in about 6 months (around 08/15/2021) for follow up visit.  [name], MD

## 2021-02-15 NOTE — Patient Instructions (Addendum)
Hold B12 for 2 weeks then start 538mcg Monday, Wednesday, Fridays. Touch base with cardiology about medicine doses (especially water pill).  Schedule lab visit in 3 months to recheck blood counts.  Let me know sooner if ongoing weight loss noted.  Return as needed or in 6 months for follow up visit.   Health Maintenance After Age 85 After age 20, you are at a higher risk for certain long-term diseases and infections as well as injuries from falls. Falls are a major cause of broken bones and head injuries in people who are older than age 37. Getting regular preventive care can help to keep you healthy and well. Preventive care includes getting regular testing and making lifestyle changes as recommended by your health care provider. Talk with your health care provider about: Which screenings and tests you should have. A screening is a test that checks for a disease when you have no symptoms. A diet and exercise plan that is right for you. What should I know about screenings and tests to prevent falls? Screening and testing are the best ways to find a health problem early. Early diagnosis and treatment give you the best chance of managing medical conditions that are common after age 72. Certain conditions and lifestyle choices may make you more likely to have a fall. Your health care provider may recommend: Regular vision checks. Poor vision and conditions such as cataracts can make you more likely to have a fall. If you wear glasses, make sure to get your prescription updated if your vision changes. Medicine review. Work with your health care provider to regularly review all of the medicines you are taking, including over-the-counter medicines. Ask your health care provider about any side effects that may make you more likely to have a fall. Tell your health care provider if any medicines that you take make you feel dizzy or sleepy. Strength and balance checks. Your health care provider may recommend  certain tests to check your strength and balance while standing, walking, or changing positions. Foot health exam. Foot pain and numbness, as well as not wearing proper footwear, can make you more likely to have a fall. Screenings, including: Osteoporosis screening. Osteoporosis is a condition that causes the bones to get weaker and break more easily. Blood pressure screening. Blood pressure changes and medicines to control blood pressure can make you feel dizzy. Depression screening. You may be more likely to have a fall if you have a fear of falling, feel depressed, or feel unable to do activities that you used to do. Alcohol use screening. Using too much alcohol can affect your balance and may make you more likely to have a fall. Follow these instructions at home: Lifestyle Do not drink alcohol if: Your health care provider tells you not to drink. If you drink alcohol: Limit how much you have to: 0-1 drink a day for women. 0-2 drinks a day for men. Know how much alcohol is in your drink. In the [place] one drink equals one 12 oz bottle of beer (355 mL), one 5 oz glass of wine (148 mL), or one 1 oz glass of hard liquor (44 mL). Do not use any products that contain nicotine or tobacco. These products include cigarettes, chewing tobacco, and vaping devices, such as e-cigarettes. If you need help quitting, ask your health care provider. Activity  Follow a regular exercise program to stay fit. This will help you maintain your balance. Ask your health care provider what types of exercise are  appropriate for you. If you need a cane or walker, use it as recommended by your health care provider. Wear supportive shoes that have nonskid soles. Safety  Remove any tripping hazards, such as rugs, cords, and clutter. Install safety equipment such as grab bars in bathrooms and safety rails on stairs. Keep rooms and walkways well-lit. General instructions Talk with your health care provider about your  risks for falling. Tell your health care provider if: You fall. Be sure to tell your health care provider about all falls, even ones that seem minor. You feel dizzy, tiredness (fatigue), or off-balance. Take over-the-counter and prescription medicines only as told by your health care provider. These include supplements. Eat a healthy diet and maintain a healthy weight. A healthy diet includes low-fat dairy products, low-fat (lean) meats, and fiber from whole grains, beans, and lots of fruits and vegetables. Stay current with your vaccines. Schedule regular health, dental, and eye exams. Summary Having a healthy lifestyle and getting preventive care can help to protect your health and wellness after age 16. Screening and testing are the best way to find a health problem early and help you avoid having a fall. Early diagnosis and treatment give you the best chance for managing medical conditions that are more common for people who are older than age 71. Falls are a major cause of broken bones and head injuries in people who are older than age 20. Take precautions to prevent a fall at home. Work with your health care provider to learn what changes you can make to improve your health and wellness and to prevent falls. This information is not intended to replace advice given to you by your health care provider. Make sure you discuss any questions you have with your health care provider. Document Revised: 06/18/2020 Document Reviewed: 06/18/2020 Elsevier Patient Education  [place].

## 2021-02-15 NOTE — Assessment & Plan Note (Signed)
Preventative protocols reviewed and updated unless pt declined. Discussed healthy diet and lifestyle.  

## 2021-02-15 NOTE — Assessment & Plan Note (Signed)
This continues improving each day.

## 2021-02-15 NOTE — Assessment & Plan Note (Signed)
B12 levels high - will hold b12 for 2 wks then restart at lower dose. Recent folate levels normal.

## 2021-02-15 NOTE — Assessment & Plan Note (Addendum)
Now followed by cardiology lipid clinic, with recent commencement of repatha. H/o statin intolerance.  The ASCVD Risk score (Arnett DK, et al., 2019) failed to calculate for the following reasons:   The 2019 ASCVD risk score is only valid for ages 25 to 44

## 2021-02-15 NOTE — Assessment & Plan Note (Signed)
Chronic, stable to low readings on current regimen.

## 2021-02-15 NOTE — Assessment & Plan Note (Signed)
Ongoing fatigue/malaise.

## 2021-02-15 NOTE — Assessment & Plan Note (Signed)
Continue repatha and aspirin.

## 2021-02-15 NOTE — Assessment & Plan Note (Signed)
He remains concerned about ongoing weight loss since he's started farxiga - I asked him to check with cards about diuretic dosing.

## 2021-02-15 NOTE — Assessment & Plan Note (Signed)
Appreciate pulm care - now on breztri

## 2021-02-15 NOTE — Assessment & Plan Note (Signed)
plt count remaining low however with some improvement noted. Recent peripheral smear reassuring. He has previously seen hematology. Will continue to monitor.

## 2021-02-15 NOTE — Assessment & Plan Note (Signed)
Followed by EP, off anticoagulant at this time in h/o GI bleed.

## 2021-02-15 NOTE — Assessment & Plan Note (Addendum)
New (previously anemic), elevated EPO levels suggest secondary cause however he denies OSA symptoms, no smoking history, denies significant alcohol intake. Will continue to monitor with repeat labs in 3 months.  Will check JAK2 next labs and consider heme referral if ongoing polycythemia.  Recent CXR 12/2020 reviewed.

## 2021-02-16 LAB — VITAMIN B1: Vitamin B1 (Thiamine): 121 nmol/L — ABNORMAL HIGH (ref 8–30)

## 2021-02-18 ENCOUNTER — Encounter: Payer: Self-pay | Admitting: Family Medicine

## 2021-02-22 ENCOUNTER — Encounter: Payer: Self-pay | Admitting: Family Medicine

## 2021-02-22 ENCOUNTER — Ambulatory Visit
Admission: RE | Admit: 2021-02-22 | Discharge: 2021-02-22 | Disposition: A | Discharge status: Home / Self Care | Payer: Medicare Other | Source: Ambulatory Visit | Attending: Family Medicine | Admitting: Family Medicine

## 2021-02-22 DIAGNOSIS — R634 Abnormal weight loss: Secondary | ICD-10-CM

## 2021-02-22 DIAGNOSIS — R1013 Epigastric pain: Secondary | ICD-10-CM

## 2021-02-23 ENCOUNTER — Other Ambulatory Visit: Payer: Self-pay | Admitting: Family Medicine

## 2021-02-24 NOTE — Telephone Encounter (Signed)
LAST APPOINTMENT DATE: 02/15/2021   NEXT APPOINTMENT DATE: 08/16/21    LAST REFILL: 11/29/20  QTY: 90

## 2021-02-25 NOTE — Telephone Encounter (Signed)
Replied via results section.  

## 2021-03-18 ENCOUNTER — Other Ambulatory Visit: Payer: Self-pay | Admitting: Cardiology

## 2021-03-18 ENCOUNTER — Other Ambulatory Visit: Payer: Self-pay | Admitting: Cardiovascular Disease

## 2021-03-29 ENCOUNTER — Other Ambulatory Visit (INDEPENDENT_AMBULATORY_CARE_PROVIDER_SITE_OTHER): Payer: Medicare Other

## 2021-03-29 ENCOUNTER — Other Ambulatory Visit: Payer: Self-pay

## 2021-03-29 DIAGNOSIS — D751 Secondary polycythemia: Secondary | ICD-10-CM | POA: Diagnosis not present

## 2021-03-29 LAB — CBC WITH DIFFERENTIAL/PLATELET
Basophils Absolute: 0 10*3/uL (ref 0.0–0.1)
Basophils Relative: 0.2 % (ref 0.0–3.0)
Eosinophils Absolute: 0.1 10*3/uL (ref 0.0–0.7)
Eosinophils Relative: 1.5 % (ref 0.0–5.0)
HCT: 49.3 % (ref 39.0–52.0)
Hemoglobin: 16.8 g/dL (ref 13.0–17.0)
Lymphocytes Relative: 32.5 % (ref 12.0–46.0)
Lymphs Abs: 2.1 10*3/uL (ref 0.7–4.0)
MCHC: 34 g/dL (ref 30.0–36.0)
MCV: 101.2 fl — ABNORMAL HIGH (ref 78.0–100.0)
Monocytes Absolute: 0.5 10*3/uL (ref 0.1–1.0)
Monocytes Relative: 8.6 % (ref 3.0–12.0)
Neutro Abs: 3.7 10*3/uL (ref 1.4–7.7)
Neutrophils Relative %: 57.2 % (ref 43.0–77.0)
Platelets: 126 10*3/uL — ABNORMAL LOW (ref 150.0–400.0)
RBC: 4.88 Mil/uL (ref 4.22–5.81)
RDW: 13 % (ref 11.5–15.5)
WBC: 6.4 10*3/uL (ref 4.0–10.5)

## 2021-03-29 LAB — COMPREHENSIVE METABOLIC PANEL
ALT: 12 U/L (ref 0–53)
AST: 17 U/L (ref 0–37)
Albumin: 4.2 g/dL (ref 3.5–5.2)
Alkaline Phosphatase: 58 U/L (ref 39–117)
BUN: 16 mg/dL (ref 6–23)
CO2: 34 mEq/L — ABNORMAL HIGH (ref 19–32)
Calcium: 8.9 mg/dL (ref 8.4–10.5)
Chloride: 100 mEq/L (ref 96–112)
Creatinine, Ser: 1.06 mg/dL (ref 0.40–1.50)
GFR: 64.43 mL/min (ref >60.00)
Glucose, Bld: 111 mg/dL — ABNORMAL HIGH (ref 70–99)
Potassium: 3.8 mEq/L (ref 3.5–5.1)
Sodium: 137 mEq/L (ref 135–145)
Total Bilirubin: 0.8 mg/dL (ref 0.2–1.2)
Total Protein: 6.3 g/dL (ref 6.0–8.3)

## 2021-04-04 ENCOUNTER — Encounter: Payer: Self-pay | Admitting: Family Medicine

## 2021-04-04 DIAGNOSIS — D751 Secondary polycythemia: Secondary | ICD-10-CM

## 2021-04-04 DIAGNOSIS — R634 Abnormal weight loss: Secondary | ICD-10-CM

## 2021-04-04 DIAGNOSIS — D696 Thrombocytopenia, unspecified: Secondary | ICD-10-CM

## 2021-04-12 LAB — JAK2 V617F QT, RFX E12-15

## 2021-04-12 LAB — JAK2 EXONS 12-15

## 2021-04-16 ENCOUNTER — Other Ambulatory Visit: Payer: Self-pay | Admitting: *Deleted

## 2021-04-16 ENCOUNTER — Telehealth: Payer: Self-pay | Admitting: Oncology

## 2021-04-16 DIAGNOSIS — D696 Thrombocytopenia, unspecified: Secondary | ICD-10-CM

## 2021-04-16 NOTE — Telephone Encounter (Signed)
Pt came in stating that Dr Darnell Level put in the order for a referral to the Hematology/Oncology with Dr Learta Codding. Pt states that he called and they stated that they haven't got the referral. Pt is asking if you could call [phone] to place the referral. Please advise. ?

## 2021-04-16 NOTE — Telephone Encounter (Signed)
Contacted patient and he is scheduled for lab 3/30 and visit with Dr. Benay Spice on 4/3. He is aware of dates and times.   ? ?

## 2021-04-16 NOTE — Progress Notes (Signed)
Lab orders placed for new patient visit in 4-6 weeks. ?

## 2021-04-22 ENCOUNTER — Other Ambulatory Visit: Payer: Self-pay | Admitting: Internal Medicine

## 2021-05-09 ENCOUNTER — Inpatient Hospital Stay: Payer: Medicare Other | Attending: Oncology

## 2021-05-09 DIAGNOSIS — D696 Thrombocytopenia, unspecified: Secondary | ICD-10-CM | POA: Insufficient documentation

## 2021-05-09 LAB — CBC WITH DIFFERENTIAL (CANCER CENTER ONLY)
Abs Immature Granulocytes: 0.02 10*3/uL (ref 0.00–0.07)
Basophils Absolute: 0 10*3/uL (ref 0.0–0.1)
Basophils Relative: 0 %
Eosinophils Absolute: 0.1 10*3/uL (ref 0.0–0.5)
Eosinophils Relative: 1 %
HCT: 50.5 % (ref 39.0–52.0)
Hemoglobin: 17.3 g/dL — ABNORMAL HIGH (ref 13.0–17.0)
Immature Granulocytes: 0 %
Lymphocytes Relative: 33 %
Lymphs Abs: 2.3 10*3/uL (ref 0.7–4.0)
MCH: 34.3 pg — ABNORMAL HIGH (ref 26.0–34.0)
MCHC: 34.3 g/dL (ref 30.0–36.0)
MCV: 100 fL (ref 80.0–100.0)
Monocytes Absolute: 0.5 10*3/uL (ref 0.1–1.0)
Monocytes Relative: 7 %
Neutro Abs: 3.9 10*3/uL (ref 1.7–7.7)
Neutrophils Relative %: 59 %
Platelet Count: 151 10*3/uL (ref 150–400)
RBC: 5.05 MIL/uL (ref 4.22–5.81)
RDW: 12.4 % (ref 11.5–15.5)
WBC Count: 6.8 10*3/uL (ref 4.0–10.5)
nRBC: 0 % (ref 0.0–0.2)

## 2021-05-09 LAB — SAVE SMEAR(SSMR), FOR PROVIDER SLIDE REVIEW

## 2021-05-13 ENCOUNTER — Inpatient Hospital Stay: Payer: Medicare Other | Attending: Oncology | Admitting: Oncology

## 2021-05-13 VITALS — BP 133/79 | HR 68 | Temp 97.8°F | Resp 20 | Ht 70.0 in | Wt 199.8 lb

## 2021-05-13 DIAGNOSIS — Z8679 Personal history of other diseases of the circulatory system: Secondary | ICD-10-CM

## 2021-05-13 DIAGNOSIS — D751 Secondary polycythemia: Secondary | ICD-10-CM | POA: Diagnosis not present

## 2021-05-13 DIAGNOSIS — I509 Heart failure, unspecified: Secondary | ICD-10-CM

## 2021-05-13 DIAGNOSIS — J449 Chronic obstructive pulmonary disease, unspecified: Secondary | ICD-10-CM | POA: Diagnosis not present

## 2021-05-13 DIAGNOSIS — D696 Thrombocytopenia, unspecified: Secondary | ICD-10-CM

## 2021-05-13 DIAGNOSIS — Z862 Personal history of diseases of the blood and blood-forming organs and certain disorders involving the immune mechanism: Secondary | ICD-10-CM

## 2021-05-13 NOTE — Progress Notes (Signed)
?[name] ?New Patient Consult ? ? ?Requesting MD: ?Theodore [name], [facility] [address] ?[place],  [place] [address] ? ? ?Theodore [name] ?87 y.o.  ?06-28-1934 ? ?  ?Reason for Consult: Thrombocytopenia, mild cytosis ? ? ?HPI: Theodore [name] had a CBC on 02/07/2021.  Hemoglobin returned at 17.2, hematocrit 50.3%, MCV one 1.8, platelets 119,000, and white count 6.4 with an absolute neutrophil count of 3.4. ?A repeat CBC on 03/29/2021 found the hemoglobin is 16.8, platelets 126,000, white count 6.4, and ANC 3.7. ?Theodore [name] has a chronic history of mild thrombocytopenia.  He was seen in the hematology clinic in 2013 when the platelet count returned at 123,000 on 04/10/2011.  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 ? ?Theodore [name] reports malaise.  He has experienced a 20 pound weight loss over the past year.  Good appetite. ? ?Past Medical History:  ?Diagnosis Date  ? CAP (community acquired pneumonia) 01/19/2018  ? History of arthritis   ? History of rheumatic fever   ? Hypercholesterolemia   ? Hypertension   ? Nocturia   ? PAF (paroxysmal atrial fibrillation) [facility]) January 2013  ? Placed on Pradaxa. Did not require cardioversion; spontaneously converted  ? Peripheral edema   ? Persistent atrial fibrillation ([place]) 01/21/2011  ? S/p ablation 12/2015 NSR up until his PCI Dec 2020 when he went into AF post PCI  ? Right bundle branch block   ? SOB (shortness of breath)   ? Tendonitis of elbow, left   ?  .  Congestive heart failure ?  [name]  COPD ? ?Past Surgical History:  ?Procedure Laterality Date  ? ATRIAL FIBRILLATION ABLATION  12/19/2015  ? BACK SURGERY  12/24/09  ? fusion C3-C4  ? BACK SURGERY  2010  ? fusion L4-L5 [facility] Halsted)  ? CARDIAC CATHETERIZATION  2009  ? NONOBSTRUCTIVE ATHERSCLEROTIC CORONARY DISEASE AND NORMAL  LV FUNCTION  ? CARDIAC CATHETERIZATION N/A 09/08/2014  ? Procedure: Left Heart Cath and Coronary Angiography;  Surgeon: Belva Crome, MD;  Location: [facility] CV LAB;  Service: Cardiovascular;  Laterality: N/A;  ? CARDIAC CATHETERIZATION N/A 09/28/2015  ? Procedure: Left Heart Cath and Coronary Angiography;  Surgeon: Leonie Man, MD;  Location: [place] CV LAB;  Service: Cardiovascular;  Laterality: N/A;  ? CARDIAC CATHETERIZATION N/A 09/28/2015  ? Procedure: Intravascular Pressure Wire/FFR Study;  Surgeon: Leonie Man, MD;  Location: [place] [facility] CV LAB;  Service: Cardiovascular;  Laterality: N/A;  ? CARDIOVERSION N/A 10/23/2015  ? Procedure: CARDIOVERSION;  Surgeon: [name], MD;  Location: [facility] ENDOSCOPY;  Service: Cardiovascular;  Laterality: N/A;  ? COLONOSCOPY  2013  ? per patient, rpt 5 yrs  ? COLONOSCOPY WITH PROPOFOL N/A 12/02/2018  ? TAx2, HP, diverticulosis [name], MD)  ? CORONARY ATHERECTOMY N/A 01/11/2019  ? Procedure: CORONARY ATHERECTOMY;  Surgeon: Burnell [name], MD;  Location: [facility] CV LAB;  Service: Cardiovascular;  Laterality: N/A;  ? CORONARY STENT INTERVENTION N/A 01/11/2019  ? Procedure: CORONARY STENT INTERVENTION;  Surgeon: Burnell [name], MD;  Location: [place] CV LAB;  Service: Cardiovascular;  Laterality: N/A;  ? ELECTROPHYSIOLOGIC STUDY N/A 12/19/2015  ? Procedure: Atrial Fibrillation Ablation;  Surgeon: [name], MD;  Location: [facility] CV LAB;  Service: Cardiovascular;  Laterality: N/A;  ? ESOPHAGOGASTRODUODENOSCOPY (EGD) WITH PROPOFOL N/A 12/02/2018  ? medium HH [name], MD)  ? INTRAVASCULAR PRESSURE WIRE/FFR STUDY N/A 01/10/2019  ? Procedure:  INTRAVASCULAR PRESSURE WIRE/FFR STUDY;  Surgeon: Leonie Man, MD;  Location: [place] [facility] CV LAB;  Service: Cardiovascular;  Laterality: N/A;  ? KNEE ARTHROSCOPY Left 03/2016  ? Dr. [name]  ? LEFT HEART CATH AND CORONARY ANGIOGRAPHY N/A 01/10/2019  ? Procedure: LEFT HEART CATH AND CORONARY ANGIOGRAPHY;  Surgeon: Leonie Man, MD;  Location: [place] CV LAB;  Service: Cardiovascular;  Laterality: N/A;  ? PATELLAR  TENDON REPAIR Left 2008  ? POLYPECTOMY  12/02/2018  ? Procedure: POLYPECTOMY;  Surgeon: [name], MD;  Location: [facility] ENDOSCOPY;  Service: Endoscopy;;  ? REPLACEMENT TOTAL KNEE Right 2006  ? TOTAL KNEE ARTHROPLASTY Left 02/09/2017  ? Procedure: LEFT TOTAL KNEE ARTHROPLASTY, EXCISION LEFT DISTAL THIGH MASS;  Surgeon: [name], MD;  Location: [facility] ORS;  Service: Orthopedics;  Laterality: Left;  90 mins  ? TRICEPS TENDON REPAIR Left 2013  ? ? ?Medications: Reviewed ? ?Allergies:  ?Allergies  ?Allergen Reactions  ? Penicillins Rash and Other (See Comments)  ?  "Blistering rash" ?Has patient had a PCN reaction causing immediate rash, facial/tongue/throat swelling, SOB or lightheadedness with hypotension: Yes ?Has patient had a PCN reaction causing severe rash involving mucus membranes or skin necrosis: No ?Has patient had a PCN reaction that required hospitalization: No ?Has patient had a PCN reaction occurring within the last 10 years: No ?If all of the above answers are "NO", then may proceed with Cephalosporin use. ?  ? Amlodipine Swelling and Other (See Comments)  ?  Peripheral edema  ? Effexor [Venlafaxine] Other (See Comments)  ?  Tremors/shaking  ? Lisinopril Cough  ? Mevacor [Lovastatin] Other (See Comments)  ?  Caused cataracts and elevated liver enzymes  ? Plavix [Clopidogrel]   ?  Muscle aches ?  ? Vicodin [Hydrocodone-Acetaminophen] Nausea And Vomiting  ? Zocor [Simvastatin] Other (See Comments)  ?  LFT increase with simvastatin and lovastatin ?  ? Oseltamivir Phosphate Itching and Rash  ? ? ?Family history: No family history of cancer or hematologic condition ? ?Social History:  ? ?He lives with his wife in [facility].  He is retired Chief Financial Officer.  He does not use cigarettes.  He has 2 light beers per day.  He has been a blood donor.  No risk factor for HIV or hepatitis. ? ?ROS:  ? ?Positives include: 20 pound weight loss over the past year, fatigue, burning in the feet at night ? ?A complete ROS was  otherwise negative. ? ?Physical Exam: ? ?Blood pressure 133/79, pulse 68, temperature 97.8 ?F (36.6 ?C), temperature source Oral, resp. rate 20, height '5\' 10"'$  (1.778 m), weight 199 lb 12.8 oz (90.6 kg), SpO2 100 %. ? ?HEENT: Oral cavity without visible mass, neck without mass ?Lungs: Clear bilaterally ?Cardiac: Regular rate and rhythm ?Abdomen: No hepatosplenomegaly, mild tenderness in the mid upper abdomen  ?Vascular: No leg edema ?Lymph nodes: No cervical, supraclavicular, axillary, or inguinal nodes ?Neurologic: Alert and oriented, the motor exam appears intact in the upper and lower extremities bilaterally ?Skin: Few ecchymoses at the dorsum of the hands and forearms ?Musculoskeletal: No spine tenderness ? ? ?LAB: ? ?CBC ? ?Lab Results  ?Component Value Date  ? WBC 6.8 05/09/2021  ? HGB 17.3 (H) 05/09/2021  ? HCT 50.5 05/09/2021  ? MCV 100.0 05/09/2021  ? PLT 151 05/09/2021  ? NEUTROABS 3.9 05/09/2021  ?  ?Blood smear: Few ovalocytes, the polychromasia is not increased.  No nucleated red cells.  The platelets are mildly decreased in number.  No platelet clumps.  The majority the white cells are neutrophils and mature appearing lymphocytes.  There are atypical lymphocytes with eccentric nuclei.  No blasts or other young forms are seen. ?  ? ?CMP  ?Lab Results  ?Component Value Date  ? NA 137 03/29/2021  ? K 3.8 03/29/2021  ? CL 100 03/29/2021  ? CO2 34 (H) 03/29/2021  ? GLUCOSE 111 (H) 03/29/2021  ? BUN 16 03/29/2021  ? CREATININE 1.06 03/29/2021  ? CALCIUM 8.9 03/29/2021  ? PROT 6.3 03/29/2021  ? ALBUMIN 4.2 03/29/2021  ? AST 17 03/29/2021  ? ALT 12 03/29/2021  ? ALKPHOS 58 03/29/2021  ? BILITOT 0.8 03/29/2021  ? GFRNONAA >60 01/12/2019  ? GFRAA >60 01/12/2019  ? ? ? ? ? ? ?Assessment/Plan:  ? ?Thrombocytopenia-chronic ? ?Mild erythrocytosis ?COPD ?CHF ?History of atrial fibrillation ?History of iron deficiency anemia and Hemoccult positive stool ? ? ?Disposition:  ? ?Mr. Monica is referred for evaluation of  thrombocytopenia.  The thrombocytopenia is mild and has been present for at least the past 10 years.  I suspect the thrombocytopenia is related to a benign normal variant or chronic ITP. ?He has mild erythrocyt

## 2021-05-16 ENCOUNTER — Other Ambulatory Visit: Payer: Medicare Other

## 2021-05-20 ENCOUNTER — Ambulatory Visit (INDEPENDENT_AMBULATORY_CARE_PROVIDER_SITE_OTHER): Payer: Medicare Other | Admitting: Student

## 2021-05-20 ENCOUNTER — Encounter: Payer: Self-pay | Admitting: Student

## 2021-05-20 VITALS — BP 90/70 | HR 81 | Ht 71.0 in | Wt 199.0 lb

## 2021-05-20 DIAGNOSIS — I2510 Atherosclerotic heart disease of native coronary artery without angina pectoris: Secondary | ICD-10-CM

## 2021-05-20 DIAGNOSIS — I428 Other cardiomyopathies: Secondary | ICD-10-CM

## 2021-05-20 DIAGNOSIS — Z9861 Coronary angioplasty status: Secondary | ICD-10-CM

## 2021-05-20 DIAGNOSIS — I493 Ventricular premature depolarization: Secondary | ICD-10-CM | POA: Diagnosis not present

## 2021-05-20 DIAGNOSIS — I4819 Other persistent atrial fibrillation: Secondary | ICD-10-CM | POA: Diagnosis not present

## 2021-05-20 LAB — CBC
Hematocrit: 51.5 % — ABNORMAL HIGH (ref 37.5–51.0)
Hemoglobin: 18.3 g/dL — ABNORMAL HIGH (ref 13.0–17.7)
MCH: 34.7 pg — ABNORMAL HIGH (ref 26.6–33.0)
MCHC: 35.5 g/dL (ref 31.5–35.7)
MCV: 98 fL — ABNORMAL HIGH (ref 79–97)
Platelets: 138 10*3/uL — ABNORMAL LOW (ref 150–450)
RBC: 5.27 x10E6/uL (ref 4.14–5.80)
RDW: 12.5 % (ref 11.6–15.4)
WBC: 8.4 10*3/uL (ref 3.4–10.8)

## 2021-05-20 LAB — COMPREHENSIVE METABOLIC PANEL
ALT: 17 IU/L (ref 0–44)
AST: 18 IU/L (ref 0–40)
Albumin/Globulin Ratio: 2.1 (ref 1.2–2.2)
Albumin: 4.1 g/dL (ref 3.6–4.6)
Alkaline Phosphatase: 66 IU/L (ref 44–121)
BUN/Creatinine Ratio: 19 (ref 10–24)
BUN: 21 mg/dL (ref 8–27)
Bilirubin Total: 0.7 mg/dL (ref 0.0–1.2)
CO2: 23 mmol/L (ref 20–29)
Calcium: 9.1 mg/dL (ref 8.6–10.2)
Chloride: 98 mmol/L (ref 96–106)
Creatinine, Ser: 1.09 mg/dL (ref 0.76–1.27)
Globulin, Total: 2 g/dL (ref 1.5–4.5)
Glucose: 104 mg/dL — ABNORMAL HIGH (ref 70–99)
Potassium: 4.8 mmol/L (ref 3.5–5.2)
Sodium: 135 mmol/L (ref 134–144)
Total Protein: 6.1 g/dL (ref 6.0–8.5)
eGFR: 67 mL/min/{1.73_m2} (ref >59)

## 2021-05-20 LAB — TSH: TSH: 2.17 u[IU]/mL (ref 0.450–4.500)

## 2021-05-20 MED ORDER — RIVAROXABAN 20 MG PO TABS: 20.0000 mg | ORAL_TABLET | Freq: Every day | ORAL | 3 refills | Status: DC

## 2021-05-20 NOTE — Progress Notes (Signed)
? ?PCP:  [name], MD ?Primary Cardiologist: Skeet Latch, MD ?Electrophysiologist: [name], MD  ? ?Theodore [name] is a 87 y.o. male seen today for [name], MD for routine electrophysiology followup.  Since last being seen in our clinic the patient reports doing OK.   ? ?He noted being back in AF about 10 days ago, notably with fatigue. He has had some non specific neck and head pain/ache as well, but isn't sure if related. Denies DOE, just fatigue. Has been off Xarelto.  ? ? he denies chest pain, palpitations, dyspnea, PND, orthopnea, nausea, vomiting, dizziness, syncope, edema, weight gain, or early satiety. ? ?Past Medical History:  ?Diagnosis Date  ? CAP (community acquired pneumonia) 01/19/2018  ? History of arthritis   ? History of rheumatic fever   ? Hypercholesterolemia   ? Hypertension   ? Nocturia   ? PAF (paroxysmal atrial fibrillation) [facility]) January 2013  ? Placed on Pradaxa. Did not require cardioversion; spontaneously converted  ? Peripheral edema   ? Persistent atrial fibrillation ([place]) 01/21/2011  ? S/p ablation 12/2015 NSR up until his PCI Dec 2020 when he went into AF post PCI  ? Right bundle branch block   ? SOB (shortness of breath)   ? Tendonitis of elbow, left   ? ?Past Surgical History:  ?Procedure Laterality Date  ? ATRIAL FIBRILLATION ABLATION  12/19/2015  ? BACK SURGERY  12/24/09  ? fusion C3-C4  ? BACK SURGERY  2010  ? fusion L4-L5 [facility])  ? CARDIAC CATHETERIZATION  2009  ? NONOBSTRUCTIVE ATHERSCLEROTIC CORONARY DISEASE AND NORMAL  LV FUNCTION  ? CARDIAC CATHETERIZATION N/A 09/08/2014  ? Procedure: Left Heart Cath and Coronary Angiography;  Surgeon: Belva Crome, MD;  Location: [place] [facility];  Service: Cardiovascular;  Laterality: N/A;  ? CARDIAC CATHETERIZATION N/A 09/28/2015  ? Procedure: Left Heart Cath and Coronary Angiography;  Surgeon: Leonie Man, MD;  Location: [place] [facility];  Service: Cardiovascular;  Laterality: N/A;  ? CARDIAC  CATHETERIZATION N/A 09/28/2015  ? Procedure: Intravascular Pressure Wire/FFR Study;  Surgeon: Leonie Man, MD;  Location: [place] [facility];  Service: Cardiovascular;  Laterality: N/A;  ? CARDIOVERSION N/A 10/23/2015  ? Procedure: CARDIOVERSION;  Surgeon: [name], MD;  Location: [facility] ENDOSCOPY;  Service: Cardiovascular;  Laterality: N/A;  ? COLONOSCOPY  2013  ? per patient, rpt 5 yrs  ? COLONOSCOPY WITH PROPOFOL N/A 12/02/2018  ? TAx2, HP, diverticulosis [name], MD)  ? CORONARY ATHERECTOMY N/A 01/11/2019  ? Procedure: CORONARY ATHERECTOMY;  Surgeon: Burnell [name], MD;  Location: [place] [facility];  Service: Cardiovascular;  Laterality: N/A;  ? CORONARY STENT INTERVENTION N/A 01/11/2019  ? Procedure: CORONARY STENT INTERVENTION;  Surgeon: Burnell [name], MD;  Location: [place] [facility];  Service: Cardiovascular;  Laterality: N/A;  ? ELECTROPHYSIOLOGIC STUDY N/A 12/19/2015  ? Procedure: Atrial Fibrillation Ablation;  Surgeon: [name], MD;  Location: [place] [facility];  Service: Cardiovascular;  Laterality: N/A;  ? ESOPHAGOGASTRODUODENOSCOPY (EGD) WITH PROPOFOL N/A 12/02/2018  ? medium HH [name], MD)  ? INTRAVASCULAR PRESSURE WIRE/FFR STUDY N/A 01/10/2019  ? Procedure: INTRAVASCULAR PRESSURE WIRE/FFR STUDY;  Surgeon: Leonie Man, MD;  Location: [place] [facility];  Service: Cardiovascular;  Laterality: N/A;  ? KNEE ARTHROSCOPY Left 03/2016  ? Dr. [name]  ? LEFT HEART CATH AND CORONARY ANGIOGRAPHY N/A 01/10/2019  ? Procedure: LEFT HEART CATH AND CORONARY ANGIOGRAPHY;  Surgeon: Leonie Man, MD;  Location: [place] [facility];  Service: Cardiovascular;  Laterality: N/A;  ? PATELLAR TENDON REPAIR Left 2008  ? POLYPECTOMY  12/02/2018  ? Procedure: POLYPECTOMY;  Surgeon: [name], MD;  Location: [facility] ENDOSCOPY;  Service: Endoscopy;;  ? REPLACEMENT TOTAL KNEE Right 2006  ? TOTAL KNEE ARTHROPLASTY Left 02/09/2017  ? Procedure: LEFT TOTAL KNEE ARTHROPLASTY,  EXCISION LEFT DISTAL THIGH MASS;  Surgeon: [name], MD;  Location: [facility] ORS;  Service: Orthopedics;  Laterality: Left;  90 mins  ? TRICEPS TENDON REPAIR Left 2013  ? ? ?Current Outpatient Medications  ?Medication Sig Dispense Refill  ? acetaminophen (TYLENOL) 500 MG tablet Take 1 tablet (500 mg total) by mouth 3 (three) times daily as needed. 30 tablet 0  ? amoxicillin (AMOXIL) 500 MG tablet amoxicillin 500 mg tablet ? TAKE 4 TABLETS BY MOUTH 1 HOUR PRIOR TO DENTAL APPOINTMENT    ? Ascorbic Acid (VITAMIN C PO) Take 500 mg by mouth daily.    ? aspirin EC 81 MG tablet Take 1 tablet (81 mg total) by mouth daily. Swallow whole. 90 tablet 3  ? Budeson-Glycopyrrol-Formoterol (BREZTRI AEROSPHERE) 160-9-4.8 MCG/ACT AERO Inhale 2 puffs into the lungs 2 (two) times daily. 32.1 g 3  ? Cholecalciferol (VITAMIN D3) 2000 units TABS Take 2,000 Units by mouth daily.    ? dapagliflozin propanediol (FARXIGA) 10 MG TABS tablet Take 1 tablet (10 mg total) by mouth daily before breakfast. 90 tablet 3  ? Evolocumab (REPATHA SURECLICK) 704 MG/ML SOAJ Inject 140 mg into the skin every 14 (fourteen) days. 6 mL 4  ? ezetimibe (ZETIA) 10 MG tablet TAKE 1 TABLET BY MOUTH  DAILY 90 tablet 2  ? furosemide (LASIX) 40 MG tablet TAKE 1 TABLET BY MOUTH  DAILY 90 tablet 3  ? gabapentin (NEURONTIN) 300 MG capsule Take 900 mg by mouth daily.    ? ipratropium-albuterol (DUONEB) 0.5-2.5 (3) MG/3ML SOLN Take 3 mLs by nebulization every 6 (six) hours as needed. 75 mL 12  ? nitroGLYCERIN (NITROSTAT) 0.4 MG SL tablet Place 1 tablet (0.4 mg total) under the tongue every 5 (five) minutes as needed. 25 tablet 2  ? pantoprazole (PROTONIX) 40 MG tablet Take 1 tablet (40 mg total) by mouth daily. 90 tablet 3  ? potassium chloride (KLOR-CON M) 10 MEQ tablet Take 1 tablet (10 mEq total) by mouth 2 (two) times daily. 180 tablet 3  ? sacubitril-valsartan (ENTRESTO) 97-103 MG Take 1 tablet by mouth 2 (two) times daily. 180 tablet 3  ? Spacer/Aero-Holding Chambers  (AEROCHAMBER MV) inhaler Use as instructed 1 each 0  ? traZODone (DESYREL) 50 MG tablet TAKE 1/2 TO 1 TABLET BY MOUTH AT BEDTIME AS NEEDED FOR SLEEP 90 tablet 0  ? vitamin B-12 (CYANOCOBALAMIN) 500 MCG tablet Take 1 tablet (500 mcg total) by mouth every Monday, Wednesday, and Friday.    ? metoprolol succinate (TOPROL-XL) 100 MG 24 hr tablet Take 1 tablet (100 mg total) by mouth daily. Take with or immediately following a meal. 90 tablet 3  ? ?No current facility-administered medications for this visit.  ? ? ?Allergies  ?Allergen Reactions  ? Penicillins Rash, Other (See Comments) and Anaphylaxis  ?  "Blistering rash" ?Has patient had a PCN reaction causing immediate rash, facial/tongue/throat swelling, SOB or lightheadedness with hypotension: Yes ?Has patient had a PCN reaction causing severe rash involving mucus membranes or skin necrosis: No ?Has patient had a PCN reaction that required hospitalization: No ?Has patient had a PCN reaction occurring within the last 10 years:  No ?If all of the above answers are "NO", then may proceed with Cephalosporin use. ? ?"Blistering rash" ?Has patient had a PCN reaction causing immediate rash, facial/tongue/throat swelling, SOB or lightheadedness with hypotension: Yes ?Has patient had a PCN reaction causing severe rash involving mucus membranes or skin necrosis: No ?Has patient had a PCN reaction that required hospitalization: No ?Has patient had a PCN reaction occurring within the last 10 years: No ?If all of the above answers are "NO", then may proceed with Cephalosporin use. ?"Blistering rash" ?Has patient had a PCN reaction causing immediate rash, facial/tongue/throat swelling, SOB or lightheadedness with hypotension: Yes ?Has patient had a PCN reaction causing severe rash involving mucus membranes or skin necrosis: No ?Has patient had a PCN reaction that required hospitalization: No ?Has patient had a PCN reaction occurring within the last 10 years: No ?If all of the above  answers are "NO", then may proceed with Cephalosporin use.  ? Amlodipine Swelling and Other (See Comments)  ?  Peripheral edema ?Other reaction(s): Other (See Comments) ?Peripheral edema ?Peripheral e

## 2021-05-20 NOTE — Patient Instructions (Signed)
Medication Instructions:  ?Your physician has recommended you make the following change in your medication:  ? ?START: Xarelto '20mg'$  daily ? ?*If you need a refill on your cardiac medications before your next appointment, please call your pharmacy* ? ? ?Lab Work: ?TODAY: CMET, CBC, TSH ? ?If you have labs (blood work) drawn today and your tests are completely normal, you will receive your results only by: ?MyChart Message (if you have MyChart) OR ?A paper copy in the mail ?If you have any lab test that is abnormal or we need to change your treatment, we will call you to review the results. ? ? ?Testing/Procedures: ?Your physician has recommended that you have a Cardioversion (DCCV). Electrical Cardioversion uses a jolt of electricity to your heart either through paddles or wired patches attached to your chest. This is a controlled, usually prescheduled, procedure. Defibrillation is done under light anesthesia in the hospital, and you usually go home the day of the procedure. This is done to get your heart back into a normal rhythm. You are not awake for the procedure. Please see the instruction sheet given to you today. ? ? ?Follow-Up: ?At [facility], you and your health needs are our priority.  As part of our continuing mission to provide you with exceptional heart care, we have created designated Provider Care Teams.  These Care Teams include your primary Cardiologist (physician) and Advanced Practice Providers (APPs -  Physician Assistants and Nurse Practitioners) who all work together to provide you with the care you need, when you need it. ? ? ?Your next appointment:   ?06/12/2021 ? ?Other Instructions ?See Letter for Cardioversion instructions ? ?Important Information About Sugar ? ? ? ? ?  ?

## 2021-05-21 ENCOUNTER — Encounter (HOSPITAL_BASED_OUTPATIENT_CLINIC_OR_DEPARTMENT_OTHER): Payer: Self-pay | Admitting: Cardiovascular Disease

## 2021-05-21 MED ORDER — FUROSEMIDE 20 MG PO TABS: 20.0000 mg | ORAL_TABLET | Freq: Every day | ORAL | 3 refills | Status: DC

## 2021-05-21 NOTE — Telephone Encounter (Signed)
Please advise 

## 2021-05-22 ENCOUNTER — Other Ambulatory Visit: Payer: Self-pay | Admitting: Family Medicine

## 2021-05-22 NOTE — Telephone Encounter (Signed)
Refill request Trazodone ?Last refill 02/24/21 #$90 ?Last office visit 02/15/21 ?Upcoming 08/16/21 ?

## 2021-05-22 NOTE — Telephone Encounter (Signed)
ERx 

## 2021-06-04 NOTE — Telephone Encounter (Signed)
Patient seen by Afib clinic since mychart message sent  ?

## 2021-06-09 NOTE — Progress Notes (Signed)
? ?Cardiology Office Note ?Date:  06/09/2021  ?Patient ID:  Theodore [name], DOB [birth year], MRN [id number] ?PCP:  [name], MD  ?Cardiologist:  Dr. [name] ?Electrophysiologist: Dr. [name] ? ? ?  ?Chief Complaint:  planned 3 week f/u ? ?History of Present Illness: ?Theodore [name] is a 87 y.o. male with history of CAD (PCI to LAD Dec 2020), HTN, HLD, AFib, neuropathy, chronic back problems, RBBB, NICM, with recovered LVEFchronic CHF (systolic). ? ?He comes in today to be seen for Dr. [name], last seen by him Sept 2022, at that time, reported intermittent low BPs at home after a recent increase in his BB dose and was reduced.  He was no longer on [place] with no AF by symptoms post ablation ? ?More recently saw A. Tillery, PA-C 05/20/21 with symptoms of his Afib for about 10 days, he was in rate controlled Afib, resumed on Xarelto, planned for f/u visit and DCCV if needed after uninterrupted a/c., perhaps amiodarone start ? ?TODAY ?He had spontaneous conversion back to SR after about 203 weeks. ?He thinks may have been a little over-diuresed and when (after d/w Dr. [name]) reduce the lasix, he went back in SR. ?No bleeding or signs of bleeding, doesn't love being on xarelto, will bleed more with nicks/cuts. ?No CP ?Active, golfs, busy around the house, his wife's health is poor and he helps her. ?No exertional incapacities. ?No CP, SOB ?No near syncope or syncope. ? ? ? ?AFib/AAD hx ?Diagnosed Dec 2012 ?PVI ablation 12/19/2015 ?No AAD to date that I find ? ?Past Medical History:  ?Diagnosis Date  ? CAP (community acquired pneumonia) 01/19/2018  ? History of arthritis   ? History of rheumatic fever   ? Hypercholesterolemia   ? Hypertension   ? Nocturia   ? PAF (paroxysmal atrial fibrillation) [facility]) January 2013  ? Placed on Pradaxa. Did not require cardioversion; spontaneously converted  ? Peripheral edema   ? Persistent atrial fibrillation () 01/21/2011  ? S/p ablation 12/2015 NSR up until his PCI Dec 2020  when he went into AF post PCI  ? Right bundle branch block   ? SOB (shortness of breath)   ? Tendonitis of elbow, left   ? ? ?Past Surgical History:  ?Procedure Laterality Date  ? ATRIAL FIBRILLATION ABLATION  12/19/2015  ? BACK SURGERY  12/24/09  ? fusion C3-C4  ? BACK SURGERY  2010  ? fusion L4-L5 [facility])  ? CARDIAC CATHETERIZATION  2009  ? NONOBSTRUCTIVE ATHERSCLEROTIC CORONARY DISEASE AND NORMAL  LV FUNCTION  ? CARDIAC CATHETERIZATION N/A 09/08/2014  ? Procedure: Left Heart Cath and Coronary Angiography;  Surgeon: Belva Crome, MD;  Location: [place] [facility];  Service: Cardiovascular;  Laterality: N/A;  ? CARDIAC CATHETERIZATION N/A 09/28/2015  ? Procedure: Left Heart Cath and Coronary Angiography;  Surgeon: Leonie Man, MD;  Location: [place] [facility];  Service: Cardiovascular;  Laterality: N/A;  ? CARDIAC CATHETERIZATION N/A 09/28/2015  ? Procedure: Intravascular Pressure Wire/FFR Study;  Surgeon: Leonie Man, MD;  Location: [place] [facility];  Service: Cardiovascular;  Laterality: N/A;  ? CARDIOVERSION N/A 10/23/2015  ? Procedure: CARDIOVERSION;  Surgeon: [name], MD;  Location: [facility] ENDOSCOPY;  Service: Cardiovascular;  Laterality: N/A;  ? COLONOSCOPY  2013  ? per patient, rpt 5 yrs  ? COLONOSCOPY WITH PROPOFOL N/A 12/02/2018  ? TAx2, HP, diverticulosis [name], MD)  ? CORONARY ATHERECTOMY N/A 01/11/2019  ? Procedure: CORONARY ATHERECTOMY;  Surgeon: Burnell [name], MD;  Location: [place] [facility];  Service: Cardiovascular;  Laterality: N/A;  ? CORONARY STENT INTERVENTION N/A 01/11/2019  ? Procedure: CORONARY STENT INTERVENTION;  Surgeon: Burnell [name], MD;  Location: [place] [facility];  Service: Cardiovascular;  Laterality: N/A;  ? ELECTROPHYSIOLOGIC STUDY N/A 12/19/2015  ? Procedure: Atrial Fibrillation Ablation;  Surgeon: [name], MD;  Location: [place] [facility];  Service: Cardiovascular;  Laterality: N/A;  ? ESOPHAGOGASTRODUODENOSCOPY (EGD) WITH  PROPOFOL N/A 12/02/2018  ? medium HH [name], MD)  ? INTRAVASCULAR PRESSURE WIRE/FFR STUDY N/A 01/10/2019  ? Procedure: INTRAVASCULAR PRESSURE WIRE/FFR STUDY;  Surgeon: Leonie Man, MD;  Location: [place] [facility];  Service: Cardiovascular;  Laterality: N/A;  ? KNEE ARTHROSCOPY Left 03/2016  ? Dr. [name]  ? LEFT HEART CATH AND CORONARY ANGIOGRAPHY N/A 01/10/2019  ? Procedure: LEFT HEART CATH AND CORONARY ANGIOGRAPHY;  Surgeon: Leonie Man, MD;  Location: [place] [facility];  Service: Cardiovascular;  Laterality: N/A;  ? PATELLAR TENDON REPAIR Left 2008  ? POLYPECTOMY  12/02/2018  ? Procedure: POLYPECTOMY;  Surgeon: [name], MD;  Location: [facility] ENDOSCOPY;  Service: Endoscopy;;  ? REPLACEMENT TOTAL KNEE Right 2006  ? TOTAL KNEE ARTHROPLASTY Left 02/09/2017  ? Procedure: LEFT TOTAL KNEE ARTHROPLASTY, EXCISION LEFT DISTAL THIGH MASS;  Surgeon: [name], MD;  Location: [facility];  Service: Orthopedics;  Laterality: Left;  90 mins  ? TRICEPS TENDON REPAIR Left 2013  ? ? ?Current Outpatient Medications  ?Medication Sig Dispense Refill  ? acetaminophen (TYLENOL) 500 MG tablet Take 1 tablet (500 mg total) by mouth 3 (three) times daily as needed. 30 tablet 0  ? amoxicillin (AMOXIL) 500 MG tablet amoxicillin 500 mg tablet ? TAKE 4 TABLETS BY MOUTH 1 HOUR PRIOR TO DENTAL APPOINTMENT    ? Ascorbic Acid (VITAMIN C PO) Take 500 mg by mouth daily.    ? aspirin EC 81 MG tablet Take 1 tablet (81 mg total) by mouth daily. Swallow whole. 90 tablet 3  ? Budeson-Glycopyrrol-Formoterol (BREZTRI AEROSPHERE) 160-9-4.8 MCG/ACT AERO Inhale 2 puffs into the lungs 2 (two) times daily. 32.1 g 3  ? Cholecalciferol (VITAMIN D3) 2000 units TABS Take 2,000 Units by mouth daily.    ? dapagliflozin propanediol (FARXIGA) 10 MG TABS tablet Take 1 tablet (10 mg total) by mouth daily before breakfast. 90 tablet 3  ? Evolocumab (REPATHA SURECLICK) 330 MG/ML SOAJ Inject 140 mg into the skin every 14 (fourteen) days. 6 mL 4  ?  ezetimibe (ZETIA) 10 MG tablet TAKE 1 TABLET BY MOUTH  DAILY 90 tablet 2  ? furosemide (LASIX) 20 MG tablet Take 1 tablet (20 mg total) by mouth daily. 90 tablet 3  ? gabapentin (NEURONTIN) 300 MG capsule Take 900 mg by mouth daily.    ? ipratropium-albuterol (DUONEB) 0.5-2.5 (3) MG/3ML SOLN Take 3 mLs by nebulization every 6 (six) hours as needed. 75 mL 12  ? metoprolol succinate (TOPROL-XL) 100 MG 24 hr tablet Take 1 tablet (100 mg total) by mouth daily. Take with or immediately following a meal. 90 tablet 3  ? nitroGLYCERIN (NITROSTAT) 0.4 MG SL tablet Place 1 tablet (0.4 mg total) under the tongue every 5 (five) minutes as needed. 25 tablet 2  ? pantoprazole (PROTONIX) 40 MG tablet Take 1 tablet (40 mg total) by mouth daily. 90 tablet 3  ? potassium chloride (KLOR-CON M) 10 MEQ tablet Take 1 tablet (10 mEq total) by mouth 2 (two) times daily. 180 tablet 3  ? rivaroxaban (XARELTO)  20 MG TABS tablet Take 1 tablet (20 mg total) by mouth daily with supper. 90 tablet 3  ? sacubitril-valsartan (ENTRESTO) 97-103 MG Take 1 tablet by mouth 2 (two) times daily. 180 tablet 3  ? Spacer/Aero-Holding Chambers (AEROCHAMBER MV) inhaler Use as instructed 1 each 0  ? traZODone (DESYREL) 50 MG tablet TAKE 1/2 TO 1 TABLET BY MOUTH AT BEDTIME AS NEEDED FOR SLEEP 90 tablet 1  ? vitamin B-12 (CYANOCOBALAMIN) 500 MCG tablet Take 1 tablet (500 mcg total) by mouth every Monday, Wednesday, and Friday.    ? ?No current facility-administered medications for this visit.  ? ? ?Allergies:   Penicillins, Amlodipine, Effexor [venlafaxine], Lisinopril, Mevacor [lovastatin], Plavix [clopidogrel], Vicodin [hydrocodone-acetaminophen], Zocor [simvastatin], and Oseltamivir phosphate  ? ?Social History:  The patient  reports that he has never smoked. He has never used smokeless tobacco. He reports current alcohol use of about 14.0 standard drinks per week. He reports that he does not use drugs.  ? ?Family History:  The patient's family history includes  Asthma in his sister; COPD in his brother and father; Heart attack in his father; Heart disease in his brother; Hypertension in his father. ? ?ROS:  Please see the history of present illness.    ?All other

## 2021-06-11 ENCOUNTER — Encounter: Payer: Self-pay | Admitting: Pharmacist Clinician (PhC)/ Clinical Pharmacy Specialist

## 2021-06-11 ENCOUNTER — Encounter (HOSPITAL_COMMUNITY): Payer: Self-pay | Admitting: Cardiology

## 2021-06-11 DIAGNOSIS — E7800 Pure hypercholesterolemia, unspecified: Secondary | ICD-10-CM

## 2021-06-12 ENCOUNTER — Ambulatory Visit (INDEPENDENT_AMBULATORY_CARE_PROVIDER_SITE_OTHER): Payer: Medicare Other | Admitting: Physician Assistant

## 2021-06-12 ENCOUNTER — Encounter: Payer: Self-pay | Admitting: Physician Assistant

## 2021-06-12 ENCOUNTER — Other Ambulatory Visit: Payer: Self-pay | Admitting: Cardiovascular Disease

## 2021-06-12 VITALS — BP 118/68 | HR 59 | Ht 71.0 in | Wt 199.0 lb

## 2021-06-12 DIAGNOSIS — I428 Other cardiomyopathies: Secondary | ICD-10-CM | POA: Diagnosis not present

## 2021-06-12 DIAGNOSIS — I2510 Atherosclerotic heart disease of native coronary artery without angina pectoris: Secondary | ICD-10-CM

## 2021-06-12 DIAGNOSIS — I4819 Other persistent atrial fibrillation: Secondary | ICD-10-CM | POA: Diagnosis not present

## 2021-06-12 NOTE — Patient Instructions (Addendum)
Medication Instructions:  ? ?Your physician recommends that you continue on your current medications as directed. Please refer to the Current Medication list given to you today. ? ? ?*If you need a refill on your cardiac medications before your next appointment, please call your pharmacy* ? ? ?Lab Work:  LIPID AND LIVER PER PHARMACIST  ? ? ? ? ?If you have labs (blood work) drawn today and your tests are completely normal, you will receive your results only by: ?MyChart Message (if you have MyChart) OR ?A paper copy in the mail ?If you have any lab test that is abnormal or we need to change your treatment, we will call you to review the results. ? ? ?Testing/Procedures: NONE ORDERED  TODAY ? ? ? ? ? ? ?Follow-Up: ?At [facility], you and your health needs are our priority.  As part of our continuing mission to provide you with exceptional heart care, we have created designated Provider Care Teams.  These Care Teams include your primary Cardiologist (physician) and Advanced Practice Providers (APPs -  Physician Assistants and Nurse Practitioners) who all work together to provide you with the care you need, when you need it. ? ?We recommend signing up for the patient portal called "MyChart".  Sign up information is provided on this After Visit Summary.  MyChart is used to connect with patients for Virtual Visits (Telemedicine).  Patients are able to view lab/test results, encounter notes, upcoming appointments, etc.  Non-urgent messages can be sent to your provider as well.   ?To learn more about what you can do with MyChart, go to [email or web address].   ? ?Your next appointment:   ?4 month(s) ? ?The format for your next appointment:   ?In Person ? ?Provider:   ?Allegra Lai, MD{ ? ?Other Instructions ? ? ?Important Information About Sugar ? ? ? ? ?  ?

## 2021-06-17 ENCOUNTER — Encounter (HOSPITAL_COMMUNITY): Payer: Self-pay

## 2021-06-17 ENCOUNTER — Ambulatory Visit (HOSPITAL_COMMUNITY): Admit: 2021-06-17 | Payer: Medicare Other | Admitting: Cardiology

## 2021-06-17 LAB — HEPATIC FUNCTION PANEL
ALT: 16 IU/L (ref 0–44)
AST: 23 IU/L (ref 0–40)
Albumin: 4.6 g/dL (ref 3.6–4.6)
Alkaline Phosphatase: 71 IU/L (ref 44–121)
Bilirubin Total: 0.7 mg/dL (ref 0.0–1.2)
Bilirubin, Direct: 0.24 mg/dL (ref 0.00–0.40)
Total Protein: 6.9 g/dL (ref 6.0–8.5)

## 2021-06-17 LAB — LIPID PANEL
Chol/HDL Ratio: 2.3 ratio (ref 0.0–5.0)
Cholesterol, Total: 120 mg/dL (ref 100–199)
HDL: 53 mg/dL (ref >39)
LDL Chol Calc (NIH): 51 mg/dL (ref 0–99)
Triglycerides: 83 mg/dL (ref 0–149)
VLDL Cholesterol Cal: 16 mg/dL (ref 5–40)

## 2021-06-17 SURGERY — CARDIOVERSION
Anesthesia: General

## 2021-06-20 ENCOUNTER — Ambulatory Visit (INDEPENDENT_AMBULATORY_CARE_PROVIDER_SITE_OTHER): Payer: Medicare Other | Admitting: Cardiovascular Disease

## 2021-06-20 ENCOUNTER — Encounter (HOSPITAL_BASED_OUTPATIENT_CLINIC_OR_DEPARTMENT_OTHER): Payer: Self-pay | Admitting: Cardiovascular Disease

## 2021-06-20 ENCOUNTER — Other Ambulatory Visit (HOSPITAL_BASED_OUTPATIENT_CLINIC_OR_DEPARTMENT_OTHER): Payer: Self-pay | Admitting: *Deleted

## 2021-06-20 VITALS — BP 124/64 | HR 63 | Ht 71.0 in | Wt 202.4 lb

## 2021-06-20 DIAGNOSIS — I428 Other cardiomyopathies: Secondary | ICD-10-CM | POA: Diagnosis not present

## 2021-06-20 DIAGNOSIS — Z9861 Coronary angioplasty status: Secondary | ICD-10-CM

## 2021-06-20 DIAGNOSIS — I700 Atherosclerosis of aorta: Secondary | ICD-10-CM

## 2021-06-20 DIAGNOSIS — I10 Essential (primary) hypertension: Secondary | ICD-10-CM

## 2021-06-20 DIAGNOSIS — I4819 Other persistent atrial fibrillation: Secondary | ICD-10-CM

## 2021-06-20 DIAGNOSIS — I351 Nonrheumatic aortic (valve) insufficiency: Secondary | ICD-10-CM

## 2021-06-20 DIAGNOSIS — I2510 Atherosclerotic heart disease of native coronary artery without angina pectoris: Secondary | ICD-10-CM

## 2021-06-20 DIAGNOSIS — I493 Ventricular premature depolarization: Secondary | ICD-10-CM

## 2021-06-20 MED ORDER — FUROSEMIDE 20 MG PO TABS: 20.0000 mg | ORAL_TABLET | Freq: Every day | ORAL | 3 refills | Status: DC

## 2021-06-20 MED ORDER — METOPROLOL SUCCINATE ER 100 MG PO TB24: 100.0000 mg | ORAL_TABLET | Freq: Every day | ORAL | 3 refills | Status: DC

## 2021-06-20 NOTE — Patient Instructions (Signed)
Medication Instructions:  Your physician recommends that you continue on your current medications as directed. Please refer to the Current Medication list given to you today.   *If you need a refill on your cardiac medications before your next appointment, please call your pharmacy*  Lab Work: NONE  Testing/Procedures: NONE  Follow-Up: At [facility], you and your health needs are our priority.  As part of our continuing mission to provide you with exceptional heart care, we have created designated Provider Care Teams.  These Care Teams include your primary Cardiologist (physician) and Advanced Practice Providers (APPs -  Physician Assistants and Nurse Practitioners) who all work together to provide you with the care you need, when you need it.  We recommend signing up for the patient portal called "MyChart".  Sign up information is provided on this After Visit Summary.  MyChart is used to connect with patients for Virtual Visits (Telemedicine).  Patients are able to view lab/test results, encounter notes, upcoming appointments, etc.  Non-urgent messages can be sent to your provider as well.   To learn more about what you can do with MyChart, go to [email or web address].    Your next appointment:   6 month(s)  The format for your next appointment:   In Person  Provider:   Tiffany [place], MD            

## 2021-06-20 NOTE — Progress Notes (Signed)
? ?Cardiology Office Note  ? ?Date:  06/20/2021  ? ?ID:  Theodore [name], DOB [birth year], MRN [id number] ? ?PCP:  Theodore Bush, MD  ?Cardiologist:  Theodore Latch, MD  ?Electrophysiologist:  Theodore Meredith Leeds, MD  ? ?Evaluation Performed:  Follow-Up Visit ? ?Chief Complaint:  Atrial fibrillation, neuropathy ? ?History of Present Illness:   ?  ?Theodore [name] is a 87 y.o. male with CAD s/p LAD PCI, chronic diastolic heart failure, hypertension, hyperlipidemia, persistent atrial fibrillation s/p ablation, asthma and Rheumatic fever who presents for follow up.  Theodore [name] was previously a patient of Dr. Mare [name].  Theodore [name] was diagnosed with atrial fibrillation in 2014.  He was initially on Pradaxa and switched to Xarelto.  He underwent ablation on 12/19/15 with Dr. Curt [name] and has remained in sinus rhythm.  He had an episode of chest pain 08/2014 and had a Lexiscan Myoview 08/31/14 that showed an old inferior scar with peri-infarct ischemia and LVEF 49%.  He then had a LHC that revealed 70% ostial LAD, 50% prox LAD, 30% OM1 and 35% RCA.  He was managed medically.  He continued to report exertional symptoms despite medical management. He had a LHC with Dr. Ellyn [name] on 09/28/15 that again revealed a 70% ostial-proximal LAD lesion.  Dr. Ellyn [name] performed FFR on that lesion and there was no significant change.  The left ventriculogram performed at that time revealed LVEF 35-45%.  This subsequently improved to 50% on 03/2018.  He again underwent left heart cath on 01/10/2019 and there was progression of his LAD lesion.  He underwent atherectomy and PCI with drug-eluting stent placement 01/11/2019.  Since then he had noted an improvement in his breathing.  Atorvastatin was increased to 80 mg but he noted leg pain.  He was seen in the lipid clinic and started back on atorvastatin '40mg'$  and zetia 10 mg.   ? ?Theodore [name] underwent L knee replacement on  02/09/17.  Xarelto was held for 2 days prior to surgery and resumed  post-operatively.  He developed bleeding at the incision site so Xarelto was again held and he was switched to aspirin.  He subsequently developed a left lower extremity DVT and pulmonary embolism.  Xarelto was resumed. He had been struggling with peripheral neuropathy.  It started a couple weeks after the cath and he thought it was related to Plavix.  He saw Dr. Curt [name] and switched from clopidogrel and and Xarelto to ticagrelor and Eliquis.  His neuropathy did not improve.  He followed up with his PCP and was started on gabapentin and nortriptyline. Theodore [name] complained of recurrent atrial fibrillation.  He wore a 14-day event monitor 07/2019 that showed some PACs, PVCs, and ventricular bigeminy but no atrial fibrillation.  ? ?At his last appointment Theodore [name] continued to suffer from symmetric burning sensations in his feet as well as shortness of breath with minimal exertion.  His LE edema was improving on 40 mg Lasix. He had reported one episode of internal bleeding, so his blood thinner was discontinued and 81 mg aspirin was started. He followed up with his neurologist Dr. Manuella [name], who recommended an implant procedure in his lower back. Prior steroidal injections for nerve impingement were not helping. Given his persistent shortness of breath, he had an Echo 09/2020 that showed his LVEF had reduced to 55 to 60% with global hypokinesis.  He had grade 2 diastolic dysfunction.  He was started on Theodore [name].  Since that time he has been feeling much better.  He had a repeat echo 12/2020 with LVEF 55-60% and indeterminate diastolic function. Right atrial pressure was 3 mmHG. He saw Dr. Curt [name] 10/2020 and decided to hold his statin for 30 days to see if this would help his neuropathy.  ? ?At his last visit 12/2020 he was feeling well and thought that Theodore [name] were helping.  He remained off of statin due to myalgias.  He saw pharmacist 01/2021 and was started on PCSK9 inhibitor.  He saw Theodore Kilts,  PA-C on 05/2021 with symptoms of atrial fibrillation.  He was in A-fib for about 10 days and rates were well controlled.  He was restarted on Xarelto and plans were made for follow-up and possible cardioversion or amiodarone.  He saw Theodore Standard, PA-C on 06/2021 and was back in sinus rhythm.  He reported low blood pressures at home after his beta-blocker was increased.  Given that this was his first sustained episode of A-fib after his ablation in 2017, they did not resume antiarrhythmics at that time.  He has remained in sinus rhythm.  He is concerned that he lost 20 lb.  He wonders if he is dehydrated.  He has overall felt well.  He is back on the golf course and can still shoot his age at times.  He has no exertional chest pain and his breathing is much better since starting Theodore [name].  He saw Dr. [name] for high RBC low Plt, especially in the setting of weight loss.  He thought the thrombocytopenia was benign normal variant versus chronic ITP.  He thought the erythrocytosis was due to underlying COPD/heart failure and polypharmacy.  He has been caring for his wife who has hydrocephalus and is very unsteady on her feet. ? ?Past Medical History:  ?Diagnosis Date  ? CAP (community acquired pneumonia) 01/19/2018  ? History of arthritis   ? History of rheumatic fever   ? Hypercholesterolemia   ? Hypertension   ? Nocturia   ? PAF (paroxysmal atrial fibrillation) Theodore [name]) January 2013  ? Placed on Pradaxa. Did not require cardioversion; spontaneously converted  ? Peripheral edema   ? Persistent atrial fibrillation (Theodore [name]) 01/21/2011  ? S/p ablation 12/2015 NSR up until his PCI Dec 2020 when he went into AF post PCI  ? Right bundle branch block   ? SOB (shortness of breath)   ? Tendonitis of elbow, left   ? ? ?Past Surgical History:  ?Procedure Laterality Date  ? ATRIAL FIBRILLATION ABLATION  12/19/2015  ? BACK SURGERY  12/24/09  ? fusion C3-C4  ? BACK SURGERY  2010  ? fusion L4-L5 Theodore [name])  ? CARDIAC  CATHETERIZATION  2009  ? NONOBSTRUCTIVE ATHERSCLEROTIC CORONARY DISEASE AND NORMAL  LV FUNCTION  ? CARDIAC CATHETERIZATION N/A 09/08/2014  ? Procedure: Left Heart Cath and Coronary Angiography;  Surgeon: Belva Crome, MD;  Location: [facility];  Service: Cardiovascular;  Laterality: N/A;  ? CARDIAC CATHETERIZATION N/A 09/28/2015  ? Procedure: Left Heart Cath and Coronary Angiography;  Surgeon: Leonie Man, MD;  Location: [facility];  Service: Cardiovascular;  Laterality: N/A;  ? CARDIAC CATHETERIZATION N/A 09/28/2015  ? Procedure: Intravascular Pressure Wire/FFR Study;  Surgeon: Leonie Man, MD;  Location: [facility];  Service: Cardiovascular;  Laterality: N/A;  ? CARDIOVERSION N/A 10/23/2015  ? Procedure: CARDIOVERSION;  Surgeon: [name], MD;  Location: [name];  Service: Cardiovascular;  Laterality: N/A;  ? COLONOSCOPY  2013  ? per patient, rpt 5 yrs  ?  COLONOSCOPY WITH PROPOFOL N/A 12/02/2018  ? TAx2, HP, diverticulosis [name], MD)  ? CORONARY ATHERECTOMY N/A 01/11/2019  ? Procedure: CORONARY ATHERECTOMY;  Surgeon: Burnell [name], MD;  Location: [place];  Service: Cardiovascular;  Laterality: N/A;  ? CORONARY STENT INTERVENTION N/A 01/11/2019  ? Procedure: CORONARY STENT INTERVENTION;  Surgeon: Burnell [name], MD;  Location: [place] [facility];  Service: Cardiovascular;  Laterality: N/A;  ? ELECTROPHYSIOLOGIC STUDY N/A 12/19/2015  ? Procedure: Atrial Fibrillation Ablation;  Surgeon: Theodore Meredith Leeds, MD;  Location: [facility];  Service: Cardiovascular;  Laterality: N/A;  ? ESOPHAGOGASTRODUODENOSCOPY (EGD) WITH PROPOFOL N/A 12/02/2018  ? medium HH [name], MD)  ? INTRAVASCULAR PRESSURE WIRE/FFR STUDY N/A 01/10/2019  ? Procedure: INTRAVASCULAR PRESSURE WIRE/FFR STUDY;  Surgeon: Leonie Man, MD;  Location: [place] [facility];  Service: Cardiovascular;  Laterality: N/A;  ? KNEE ARTHROSCOPY Left 03/2016  ? Dr. [name]  ?  LEFT HEART CATH AND CORONARY ANGIOGRAPHY N/A 01/10/2019  ? Procedure: LEFT HEART CATH AND CORONARY ANGIOGRAPHY;  Surgeon: Leonie Man, MD;  Location: [facility];  Service: Cardiovascular;  Laterality: N/A;

## 2021-06-20 NOTE — Telephone Encounter (Signed)
Refilled as requested at office visit today  ?

## 2021-06-27 ENCOUNTER — Ambulatory Visit (HOSPITAL_BASED_OUTPATIENT_CLINIC_OR_DEPARTMENT_OTHER): Payer: Medicare Other | Admitting: Cardiovascular Disease

## 2021-07-24 ENCOUNTER — Other Ambulatory Visit: Payer: Self-pay

## 2021-07-24 NOTE — Telephone Encounter (Signed)
Recently sent to [facility] but they only give him 30 day supply. He would like to switch to [facility] so he can get 90 day supply.

## 2021-07-24 NOTE — Telephone Encounter (Signed)
Patient seen by Dr [name] 06/2021

## 2021-07-25 MED ORDER — TRAZODONE HCL 50 MG PO TABS: 25.0000 mg | ORAL_TABLET | Freq: Every evening | ORAL | 1 refills | Status: DC | PRN

## 2021-07-25 NOTE — Telephone Encounter (Signed)
ERx 

## 2021-08-16 ENCOUNTER — Encounter: Payer: Self-pay | Admitting: Family Medicine

## 2021-08-16 ENCOUNTER — Ambulatory Visit: Payer: Medicare Other | Admitting: Family Medicine

## 2021-08-16 VITALS — BP 118/64 | HR 55 | Temp 97.7°F | Ht 71.0 in | Wt 195.5 lb

## 2021-08-16 DIAGNOSIS — R051 Acute cough: Secondary | ICD-10-CM

## 2021-08-16 DIAGNOSIS — R634 Abnormal weight loss: Secondary | ICD-10-CM

## 2021-08-16 DIAGNOSIS — I480 Paroxysmal atrial fibrillation: Secondary | ICD-10-CM

## 2021-08-16 DIAGNOSIS — K219 Gastro-esophageal reflux disease without esophagitis: Secondary | ICD-10-CM

## 2021-08-16 DIAGNOSIS — G609 Hereditary and idiopathic neuropathy, unspecified: Secondary | ICD-10-CM

## 2021-08-16 DIAGNOSIS — D751 Secondary polycythemia: Secondary | ICD-10-CM | POA: Diagnosis not present

## 2021-08-16 DIAGNOSIS — D696 Thrombocytopenia, unspecified: Secondary | ICD-10-CM | POA: Diagnosis not present

## 2021-08-16 DIAGNOSIS — R1319 Other dysphagia: Secondary | ICD-10-CM

## 2021-08-16 DIAGNOSIS — R0989 Other specified symptoms and signs involving the circulatory and respiratory systems: Secondary | ICD-10-CM

## 2021-08-16 LAB — COMPREHENSIVE METABOLIC PANEL
ALT: 14 U/L (ref 0–53)
AST: 18 U/L (ref 0–37)
Albumin: 4.1 g/dL (ref 3.5–5.2)
Alkaline Phosphatase: 64 U/L (ref 39–117)
BUN: 17 mg/dL (ref 6–23)
CO2: 28 mEq/L (ref 19–32)
Calcium: 9.3 mg/dL (ref 8.4–10.5)
Chloride: 98 mEq/L (ref 96–112)
Creatinine, Ser: 0.98 mg/dL (ref 0.40–1.50)
GFR: 70.6 mL/min (ref >60.00)
Glucose, Bld: 103 mg/dL — ABNORMAL HIGH (ref 70–99)
Potassium: 4.5 mEq/L (ref 3.5–5.1)
Sodium: 134 mEq/L — ABNORMAL LOW (ref 135–145)
Total Bilirubin: 0.6 mg/dL (ref 0.2–1.2)
Total Protein: 6.3 g/dL (ref 6.0–8.3)

## 2021-08-16 LAB — CBC WITH DIFFERENTIAL/PLATELET
Basophils Absolute: 0 10*3/uL (ref 0.0–0.1)
Basophils Relative: 0.3 % (ref 0.0–3.0)
Eosinophils Absolute: 0.1 10*3/uL (ref 0.0–0.7)
Eosinophils Relative: 0.9 % (ref 0.0–5.0)
HCT: 50.3 % (ref 39.0–52.0)
Hemoglobin: 16.9 g/dL (ref 13.0–17.0)
Lymphocytes Relative: 25.6 % (ref 12.0–46.0)
Lymphs Abs: 1.9 10*3/uL (ref 0.7–4.0)
MCHC: 33.6 g/dL (ref 30.0–36.0)
MCV: 100.9 fl — ABNORMAL HIGH (ref 78.0–100.0)
Monocytes Absolute: 0.6 10*3/uL (ref 0.1–1.0)
Monocytes Relative: 8.2 % (ref 3.0–12.0)
Neutro Abs: 4.7 10*3/uL (ref 1.4–7.7)
Neutrophils Relative %: 65 % (ref 43.0–77.0)
Platelets: 160 10*3/uL (ref 150.0–400.0)
RBC: 4.99 Mil/uL (ref 4.22–5.81)
RDW: 13.7 % (ref 11.5–15.5)
WBC: 7.3 10*3/uL (ref 4.0–10.5)

## 2021-08-16 LAB — SEDIMENTATION RATE: Sed Rate: 3 mm/hr (ref 0–20)

## 2021-08-16 LAB — VITAMIN B12: Vitamin B-12: 934 pg/mL — ABNORMAL HIGH (ref 211–911)

## 2021-08-16 MED ORDER — AZITHROMYCIN 250 MG PO TABS: ORAL_TABLET | ORAL | 0 refills | Status: DC

## 2021-08-16 NOTE — Progress Notes (Unsigned)
Patient ID: Theodore [name], male    DOB: Nov 14, 1934, 87 y.o.   MRN: [id number]  This visit was conducted in person.  BP 118/64   Pulse (!) 55   Temp 97.7 F (36.5 C) (Temporal)   Ht '5\' 11"'$  (1.803 m)   Wt 195 lb 8 oz (88.7 kg)   SpO2 96%   BMI 27.27 kg/m    CC: 6 mo f/u visit  Subjective:   HPI: Theodore [name] is a 87 y.o. male presenting on 08/16/2021 for Follow-up (Here for 6 mo f/u.)   Polycythemia (new) with thrombocytopenia (chronic) - saw hematology Dr Benay Spice - thought benign low plt and mild erythrocytosis due to COPD/CHF or polypharmacy - planned rpt 6 mo check.   Had another episode of atrial fibrillation 06/2021 that spontaneously resolved (on lower lasix dose).  Back on xarelto due to this.   Recent pill and liquid dysphagia worse in am with weight loss - initially attributed to oral thrush s/p treatment 12/2020. This is overall better. Notes weight loss started when he started farxiga and entresto 10/2020. Another 10 lb weight loss since last seen. Brings graph showing 25 lb weight loss since 02/2020. No early satiety or globus sensation or choking. No significant GERD - on pantoprazole '40mg'$  daily.   He received iron infusion 02/2020 - with significant improvement in energy at that time. Was on xarelto and plavix at that time - now off plavix but on xarelto.   B12 currently on hold.  HLD - continues Repatha No claudication symptoms but R calf and ankle pain at night time.   Current cold symptoms for 2 wks - using mucinex and corcedin. Worse productive cough and congestion in evenings. PNdrainage. No fevers. No sick contacts at home.   Upcoming trip with extended family to [place].      Relevant past medical, surgical, family and social history reviewed and updated as indicated. Interim medical history since our last visit reviewed. Allergies and medications reviewed and updated. Outpatient Medications Prior to Visit  Medication Sig Dispense Refill    acetaminophen (TYLENOL) 500 MG tablet Take 1 tablet (500 mg total) by mouth 3 (three) times daily as needed. 30 tablet 0   amoxicillin (AMOXIL) 500 MG tablet amoxicillin 500 mg tablet  TAKE 4 TABLETS BY MOUTH 1 HOUR PRIOR TO DENTAL APPOINTMENT     aspirin EC 81 MG tablet Take 1 tablet (81 mg total) by mouth daily. Swallow whole. 90 tablet 3   Budeson-Glycopyrrol-Formoterol (BREZTRI AEROSPHERE) 160-9-4.8 MCG/ACT AERO Inhale 2 puffs into the lungs 2 (two) times daily. 32.1 g 3   Cholecalciferol (VITAMIN D3) 2000 units TABS Take 2,000 Units by mouth daily.     dapagliflozin propanediol (FARXIGA) 10 MG TABS tablet Take 1 tablet (10 mg total) by mouth daily before breakfast. 90 tablet 3   Evolocumab (REPATHA SURECLICK) 160 MG/ML SOAJ Inject 140 mg into the skin every 14 (fourteen) days. 6 mL 4   ezetimibe (ZETIA) 10 MG tablet TAKE 1 TABLET BY MOUTH  DAILY 90 tablet 2   furosemide (LASIX) 20 MG tablet Take 1 tablet (20 mg total) by mouth daily. 90 tablet 3   gabapentin (NEURONTIN) 300 MG capsule Take 900 mg by mouth every evening.     metoprolol succinate (TOPROL-XL) 100 MG 24 hr tablet Take 1 tablet (100 mg total) by mouth daily. Take with or immediately following a meal. 90 tablet 3   pantoprazole (PROTONIX) 40 MG tablet Take 1 tablet (40  mg total) by mouth daily. 90 tablet 3   potassium chloride (KLOR-CON M) 10 MEQ tablet Take 1 tablet (10 mEq total) by mouth 2 (two) times daily. 180 tablet 3   rivaroxaban (XARELTO) 20 MG TABS tablet Take 1 tablet (20 mg total) by mouth daily with supper. 90 tablet 3   sacubitril-valsartan (ENTRESTO) 97-103 MG Take 1 tablet by mouth 2 (two) times daily. 180 tablet 3   traZODone (DESYREL) 50 MG tablet Take 0.5-1 tablets (25-50 mg total) by mouth at bedtime as needed. for sleep 90 tablet 1   vitamin C (ASCORBIC ACID) 500 MG tablet Take 500 mg by mouth daily.     No facility-administered medications prior to visit.     Per HPI unless specifically indicated in ROS  section below Review of Systems  Objective:  BP 118/64   Pulse (!) 55   Temp 97.7 F (36.5 C) (Temporal)   Ht '5\' 11"'$  (1.803 m)   Wt 195 lb 8 oz (88.7 kg)   SpO2 96%   BMI 27.27 kg/m   Wt Readings from Last 3 Encounters:  08/16/21 195 lb 8 oz (88.7 kg)  06/20/21 202 lb 6.4 oz (91.8 kg)  06/12/21 199 lb (90.3 kg)      Physical Exam Vitals and nursing note reviewed.  Constitutional:      Appearance: Normal appearance. He is not ill-appearing.  HENT:     Head: Normocephalic and atraumatic.     Right Ear: Tympanic membrane, ear canal and external ear normal. There is no impacted cerumen.     Left Ear: Tympanic membrane, ear canal and external ear normal. There is no impacted cerumen.     Nose: Rhinorrhea present.     Mouth/Throat:     Mouth: Mucous membranes are moist.     Pharynx: Oropharynx is clear. No oropharyngeal exudate or posterior oropharyngeal erythema.  Eyes:     Extraocular Movements: Extraocular movements intact.     Conjunctiva/sclera: Conjunctivae normal.     Pupils: Pupils are equal, round, and reactive to light.  Neck:     Thyroid: No thyroid mass or thyromegaly.  Cardiovascular:     Rate and Rhythm: Normal rate and regular rhythm.     Pulses: Normal pulses.     Heart sounds: Normal heart sounds. No murmur heard. Pulmonary:     Effort: Pulmonary effort is normal. No respiratory distress.     Breath sounds: Normal breath sounds. No wheezing, rhonchi or rales.     Comments: Lungs largely clear Musculoskeletal:     Cervical back: Normal range of motion and neck supple. No rigidity.     Right lower leg: No edema.     Left lower leg: No edema.     Comments: Diminished pedal pulses bilaterally R>L without palpable cords   Lymphadenopathy:     Cervical: No cervical adenopathy.  Skin:    General: Skin is warm and dry.     Findings: No rash.     Comments:  Purplish hue to distal feet Delayed cap refill   Neurological:     Mental Status: He is alert.   Psychiatric:        Mood and Affect: Mood normal.        Behavior: Behavior normal.       Results for orders placed or performed in visit on 06/12/21  Lipid panel  Result Value Ref Range   Cholesterol, Total 120 100 - 199 mg/dL   Triglycerides 83 0 - 149 mg/dL  HDL 53 >39 mg/dL   VLDL Cholesterol Cal 16 5 - 40 mg/dL   LDL Chol Calc (NIH) 51 0 - 99 mg/dL   Chol/HDL Ratio 2.3 0.0 - 5.0 ratio   Lab Results  Component Value Date   CREATININE 1.09 05/20/2021   BUN 21 05/20/2021   NA 135 05/20/2021   K 4.8 05/20/2021   CL 98 05/20/2021   CO2 23 05/20/2021    Lab Results  Component Value Date   ALT 16 06/12/2021   AST 23 06/12/2021   ALKPHOS 71 06/12/2021   BILITOT 0.7 06/12/2021    Lab Results  Component Value Date   WBC 8.4 05/20/2021   HGB 18.3 (H) 05/20/2021   HCT 51.5 (H) 05/20/2021   MCV 98 (H) 05/20/2021   PLT 138 (L) 05/20/2021   Lab Results  Component Value Date   VITAMINB12 >1550 (H) 02/12/2021    Lab Results  Component Value Date   FOLATE 12.9 02/07/2021    Lab Results  Component Value Date   TSH 2.170 05/20/2021    Lab Results  Component Value Date   IRON 147 02/07/2021   TIBC 385.0 02/07/2021   FERRITIN 40.2 02/12/2021    Assessment & Plan:   Problem List Items Addressed This Visit     Unintended weight loss - Primary   Relevant Orders   Vitamin B12   Comprehensive metabolic panel   CBC with Differential/Platelet   Sedimentation rate     Meds ordered this encounter  Medications   azithromycin (ZITHROMAX) 250 MG tablet    Sig: Take two tablets on day one followed by one tablet on days 2-5    Dispense:  6 each    Refill:  0   Orders Placed This Encounter  Procedures   Vitamin B12   Comprehensive metabolic panel   CBC with Differential/Platelet   Sedimentation rate     Patient Instructions  Take azithromycin zpack for ongoing cough.  Continue current medicines.  Labs today. We will be in touch with results.  I hope you  enjoy your family and beach trip!  Follow up plan: Return in about 6 months (around 02/16/2022) for annual exam, prior fasting for blood work, medicare wellness visit.  [name], MD

## 2021-08-16 NOTE — Patient Instructions (Addendum)
Take azithromycin zpack for ongoing cough.  Continue current medicines.  Labs today. We will be in touch with results.  I hope you enjoy your family and beach trip!

## 2021-08-17 DIAGNOSIS — R059 Cough, unspecified: Secondary | ICD-10-CM | POA: Insufficient documentation

## 2021-08-17 NOTE — Assessment & Plan Note (Signed)
Chronic, thought benign cause possible ITP

## 2021-08-17 NOTE — Assessment & Plan Note (Addendum)
Recurrent episode 06/2021 - now back on anticoagulant xarelto.  S/p ablation 2017 H/o GI bleed - will monitor closely.

## 2021-08-17 NOTE — Assessment & Plan Note (Signed)
Saw heme - attributed to COPD/CHF and polypharmacy.

## 2021-08-17 NOTE — Assessment & Plan Note (Addendum)
Ongoing despite gabapentin '900mg'$  nightly.  Statin may have worsened neuropathy.  B12 remains on hold due to high readings - will update levels.

## 2021-08-17 NOTE — Assessment & Plan Note (Signed)
Prior ABIs 08/2019 with non-compressible arteries, TBIs were normal.  Denies claudication symptoms. Has not seen [facility].  Persistently weak pedal pulses. Consider updated ABI.

## 2021-08-17 NOTE — Assessment & Plan Note (Signed)
Predominant pill but also liquid dysphagia, ongoing for months.  Reviewed latest EGD from 2020 - known moderate HH.  GERD well controlled on daily PPI.  Given ongoing symptoms, will proceed with barium swallow study in [place].

## 2021-08-17 NOTE — Assessment & Plan Note (Signed)
Ongoing weight loss that started around time farxiga and entresto were started, workup for other cause so far unrevealing. Update labs today. See above re dysphagia

## 2021-08-17 NOTE — Assessment & Plan Note (Signed)
Stable period on daily pantoprazole.

## 2021-08-17 NOTE — Assessment & Plan Note (Signed)
Ongoing productive cough over the past 2 weeks, associated with PNDrainage. Worse in evenings. Not improving despite mucinex and corcedin. Given duration of symptoms, will cover for atypical infection with azithromycin course.

## 2021-08-20 ENCOUNTER — Ambulatory Visit: Payer: Medicare Other | Admitting: Family Medicine

## 2021-08-27 ENCOUNTER — Ambulatory Visit
Admission: RE | Admit: 2021-08-27 | Discharge: 2021-08-27 | Disposition: A | Discharge status: Home / Self Care | Payer: Medicare Other | Source: Ambulatory Visit | Attending: Family Medicine | Admitting: Family Medicine

## 2021-08-27 DIAGNOSIS — R1319 Other dysphagia: Secondary | ICD-10-CM

## 2021-08-27 DIAGNOSIS — R634 Abnormal weight loss: Secondary | ICD-10-CM

## 2021-08-31 ENCOUNTER — Encounter: Payer: Self-pay | Admitting: Family Medicine

## 2021-08-31 DIAGNOSIS — R634 Abnormal weight loss: Secondary | ICD-10-CM

## 2021-08-31 DIAGNOSIS — R1319 Other dysphagia: Secondary | ICD-10-CM

## 2021-09-06 NOTE — Addendum Note (Signed)
Addended by: [name] on: 09/06/2021 07:24 AM   Modules accepted: Orders

## 2021-10-04 ENCOUNTER — Other Ambulatory Visit (HOSPITAL_BASED_OUTPATIENT_CLINIC_OR_DEPARTMENT_OTHER): Payer: Self-pay | Admitting: Cardiovascular Disease

## 2021-10-04 ENCOUNTER — Other Ambulatory Visit: Payer: Self-pay | Admitting: Cardiology

## 2021-10-04 NOTE — Telephone Encounter (Signed)
Rx request sent to pharmacy.  

## 2021-10-17 ENCOUNTER — Encounter: Payer: Self-pay | Admitting: Gastroenterology

## 2021-10-17 ENCOUNTER — Telehealth: Payer: Self-pay

## 2021-10-17 ENCOUNTER — Ambulatory Visit (INDEPENDENT_AMBULATORY_CARE_PROVIDER_SITE_OTHER): Payer: Medicare Other | Admitting: Gastroenterology

## 2021-10-17 VITALS — BP 126/70 | HR 58 | Ht 71.0 in | Wt 195.2 lb

## 2021-10-17 DIAGNOSIS — R070 Pain in throat: Secondary | ICD-10-CM | POA: Diagnosis not present

## 2021-10-17 DIAGNOSIS — R1314 Dysphagia, pharyngoesophageal phase: Secondary | ICD-10-CM | POA: Diagnosis not present

## 2021-10-17 NOTE — Progress Notes (Signed)
Theodore [name]:  History: Theodore [name] 10/17/2021  Referring provider: [name], MD  Reason for consult/chief complaint: Dysphagia (Pt states it is hard to swallow) and Weight Loss   Subjective  HPI: I saw Theodore [name] in February 2022 for office consultation regarding iron deficiency anemia and heme positive stool.  It had been going on a couple of years and previously under the care of Dr. [name] from [place].  Overall clinical scenario favored occult GI blood loss either from [facility] erosions of hiatal hernia or small bowel AVMs.  I recommended foregoing any further endoscopic testing, favoring close monitoring of hemoglobin and iron replacement.  He is referred back to me at this time by his PCP Dr. Dorna Bloom as for dysphagia that most commonly occurs in the morning and with pills.  He had a barium study as noted below.  Review of Dr. Bosie Clos office [name] from 08/16/2021 indicates that weight loss seem to start about the time that Theodore [name] started [place].  Speaking with Theodore [name] today, he confirms that observation. (Weight 199 pounds on 06/12/2021, 202 pounds on 06/20/2021, 195 pounds on 08/16/2021.)  Theodore [name] tells me that for about the last year he has felt difficulty swallowing some pills or a large gulp of liquid.  It feels like it gets caught in the back of his throat he may need to cough it back up.  He has intermittent throat mucus and congestion as well.  Denies hemoptysis odynophagia, loss of appetite nausea or vomiting. Bowel habits are regular without rectal bleeding. ROS:  Review of Systems  Constitutional:  Positive for fatigue. Negative for appetite change and unexpected weight change.  HENT:  Negative for mouth sores and voice change.   Eyes:  Negative for pain and redness.  Respiratory:  Negative for cough and shortness of breath.   Cardiovascular:  Negative for chest pain and palpitations.  Genitourinary:  Negative for dysuria and  hematuria.  Musculoskeletal:  Positive for arthralgias. Negative for myalgias.  Skin:  Negative for pallor and rash.  Neurological:  Negative for weakness and headaches.  Hematological:  Negative for adenopathy.     Past Medical History: Past Medical History:  Diagnosis Date   CAP (community acquired pneumonia) 01/19/2018   History of arthritis    History of rheumatic fever    Hypercholesterolemia    Hypertension    Nocturia    PAF (paroxysmal atrial fibrillation) [facility]) January 2013   Placed on Pradaxa. Did not require cardioversion; spontaneously converted   Peripheral edema    Persistent atrial fibrillation ([place]) 01/21/2011   S/p ablation 12/2015 NSR up until his PCI Dec 2020 when he went into AF post PCI   Right bundle branch block    SOB (shortness of breath)    Tendonitis of elbow, left      Past Surgical History: Past Surgical History:  Procedure Laterality Date   ATRIAL FIBRILLATION ABLATION  12/19/2015   BACK SURGERY  12/24/09   fusion C3-C4   BACK SURGERY  2010   fusion L4-L5 [facility])   CARDIAC CATHETERIZATION  2009   NONOBSTRUCTIVE ATHERSCLEROTIC CORONARY DISEASE AND NORMAL  LV FUNCTION   CARDIAC CATHETERIZATION N/A 09/08/2014   Procedure: Left Heart Cath and Coronary Angiography;  Surgeon: Belva Crome, MD;  Location: [place] CV LAB;  Service: Cardiovascular;  Laterality: N/A;   CARDIAC CATHETERIZATION N/A 09/28/2015   Procedure: Left Heart Cath and Coronary Angiography;  Surgeon: Leonie Man, MD;  Location: [place]  CV LAB;  Service: Cardiovascular;  Laterality: N/A;   CARDIAC CATHETERIZATION N/A 09/28/2015   Procedure: Intravascular Pressure Wire/FFR Study;  Surgeon: Leonie Man, MD;  Location: [place] CV LAB;  Service: Cardiovascular;  Laterality: N/A;   CARDIOVERSION N/A 10/23/2015   Procedure: CARDIOVERSION;  Surgeon: [name], MD;  Location: [facility] ENDOSCOPY;  Service: Cardiovascular;  Laterality: N/A;   COLONOSCOPY  2013   per  patient, rpt 5 yrs   COLONOSCOPY WITH PROPOFOL N/A 12/02/2018   TAx2, HP, diverticulosis [name], MD)   CORONARY ATHERECTOMY N/A 01/11/2019   Procedure: CORONARY ATHERECTOMY;  Surgeon: Burnell [name], MD;  Location: [place] CV LAB;  Service: Cardiovascular;  Laterality: N/A;   CORONARY STENT INTERVENTION N/A 01/11/2019   Procedure: CORONARY STENT INTERVENTION;  Surgeon: Burnell [name], MD;  Location: [place] CV LAB;  Service: Cardiovascular;  Laterality: N/A;   ELECTROPHYSIOLOGIC STUDY N/A 12/19/2015   Procedure: Atrial Fibrillation Ablation;  Surgeon: [name], MD;  Location: [place] CV LAB;  Service: Cardiovascular;  Laterality: N/A;   ESOPHAGOGASTRODUODENOSCOPY (EGD) WITH PROPOFOL N/A 12/02/2018   medium HH [name], MD)   INTRAVASCULAR PRESSURE WIRE/FFR STUDY N/A 01/10/2019   Procedure: INTRAVASCULAR PRESSURE WIRE/FFR STUDY;  Surgeon: Leonie Man, MD;  Location: [place] CV LAB;  Service: Cardiovascular;  Laterality: N/A;   KNEE ARTHROSCOPY Left 03/2016   Dr. [name]   LEFT HEART CATH AND CORONARY ANGIOGRAPHY N/A 01/10/2019   Procedure: LEFT HEART CATH AND CORONARY ANGIOGRAPHY;  Surgeon: Leonie Man, MD;  Location: [place] CV LAB;  Service: Cardiovascular;  Laterality: N/A;   PATELLAR TENDON REPAIR Left 2008   POLYPECTOMY  12/02/2018   Procedure: POLYPECTOMY;  Surgeon: [name], MD;  Location: [facility] ENDOSCOPY;  Service: Endoscopy;;   REPLACEMENT TOTAL KNEE Right 2006   TOTAL KNEE ARTHROPLASTY Left 02/09/2017   Procedure: LEFT TOTAL KNEE ARTHROPLASTY, EXCISION LEFT DISTAL THIGH MASS;  Surgeon: [name], MD;  Location: [facility] ORS;  Service: Orthopedics;  Laterality: Left;  90 mins   TRICEPS TENDON REPAIR Left 2013     Family History: Family History  Problem Relation Age of Onset   Heart attack Father    COPD Father    Hypertension Father    Asthma Sister    Heart disease Brother    COPD Brother    Stroke Neg  Hx    Colon cancer Neg Hx    Stomach cancer Neg Hx     Social History: Social History   Socioeconomic History   Marital status: Married    Spouse name: Not on file   Number of children: Not on file   Years of education: Not on file   Highest education level: Not on file  Occupational History   Occupation: retired    Fish farm manager: RETIRED    Comment: Art gallery manager  Tobacco Use   Smoking status: Never   Smokeless tobacco: Never  Vaping Use   Vaping Use: Never used  Substance and Sexual Activity   Alcohol use: Yes    Alcohol/week: 14.0 standard drinks of alcohol    Types: 14 Cans of beer per week    Comment: 2 beers daily   Drug use: No   Sexual activity: Not Currently  Other Topics Concern   Not on file  Social History Narrative   Lives with wife, no pets   Retired   Occ: Art gallery manager   Edu: master's   Activity: golf   Diet: good water, fruits/vegetables  daily   Social Determinants of Health   Financial Resource Strain: Low Risk  (08/09/2019)   Overall Financial Resource Strain (CARDIA)    Difficulty of Paying Living Expenses: Not hard at all  Food Insecurity: No Food Insecurity (01/08/2021)   Hunger Vital Sign    Worried About Running Out of Food in the Last Year: Never true    Ran Out of Food in the Last Year: Never true  Transportation Needs: No Transportation Needs (01/08/2021)   PRAPARE - Hydrologist (Medical): No    Lack of Transportation (Non-Medical): No  Physical Activity: Insufficiently Active (01/08/2021)   Exercise Vital Sign    Days of Exercise per Week: 2 days    Minutes of Exercise per Session: 60 min  Stress: No Stress Concern Present (01/08/2021)   [place]    Feeling of Stress : Not at all  Social Connections: Moderately Integrated (01/08/2021)   Social Connection and Isolation Panel [NHANES]    Frequency of Communication with Friends  and Family: More than three times a week    Frequency of Social Gatherings with Friends and Family: Three times a week    Attends Religious Services: Never    Active Member of Clubs or Organizations: Yes    Attends Music therapist: More than 4 times per year    Marital Status: Married   He remains active, golfing when he can, and doing much of the housework since his wife has issues with hydrocephalus and balance.   Allergies: Allergies  Allergen Reactions   Penicillins Anaphylaxis, Rash and Other (See Comments)    "Blistering rash" Has patient had a PCN reaction causing immediate rash, facial/tongue/throat swelling, SOB or lightheadedness with hypotension: Yes Has patient had a PCN reaction causing severe rash involving mucus membranes or skin necrosis: No Has patient had a PCN reaction that required hospitalization: No Has patient had a PCN reaction occurring within the last 10 years: No If all of the above answers are "NO", then may proceed with Cephalosporin use.    Amlodipine Swelling and Other (See Comments)    Peripheral edema   Effexor [Venlafaxine] Other (See Comments)    Tremors/shaking   Lisinopril Cough   Mevacor [Lovastatin] Other (See Comments)    Caused cataracts and elevated liver enzymes   Vicodin [Hydrocodone-Acetaminophen] Nausea And Vomiting   Zocor [Simvastatin] Other (See Comments)    LFT increase with simvastatin and lovastatin    Oseltamivir Phosphate Itching and Rash    Outpatient Meds: Current Outpatient Medications  Medication Sig Dispense Refill   acetaminophen (TYLENOL) 500 MG tablet Take 1 tablet (500 mg total) by mouth 3 (three) times daily as needed. 30 tablet 0   amoxicillin (AMOXIL) 500 MG tablet amoxicillin 500 mg tablet  TAKE 4 TABLETS BY MOUTH 1 HOUR PRIOR TO DENTAL APPOINTMENT     aspirin EC 81 MG tablet Take 1 tablet (81 mg total) by mouth daily. Swallow whole. 90 tablet 3   Budeson-Glycopyrrol-Formoterol (BREZTRI  AEROSPHERE) 160-9-4.8 MCG/ACT AERO Inhale 2 puffs into the lungs 2 (two) times daily. 32.1 g 3   Cholecalciferol (VITAMIN D3) 2000 units TABS Take 2,000 Units by mouth daily.     ENTRESTO 97-103 MG TAKE 1 TABLET BY MOUTH TWICE  DAILY (DISCONTINUE VALSARTAN) 180 tablet 3   Evolocumab (REPATHA SURECLICK) 347 MG/ML SOAJ Inject 140 mg into the skin every 14 (fourteen) days. 6 mL 4   ezetimibe (ZETIA)  10 MG tablet TAKE 1 TABLET BY MOUTH DAILY 90 tablet 3   FARXIGA 10 MG TABS tablet TAKE 1 TABLET BY MOUTH DAILY  BEFORE BREAKFAST 90 tablet 3   furosemide (LASIX) 20 MG tablet Take 1 tablet (20 mg total) by mouth daily. 90 tablet 3   gabapentin (NEURONTIN) 300 MG capsule Take 900 mg by mouth every evening.     metoprolol succinate (TOPROL-XL) 100 MG 24 hr tablet Take 1 tablet (100 mg total) by mouth daily. Take with or immediately following a meal. 90 tablet 3   pantoprazole (PROTONIX) 40 MG tablet Take 1 tablet (40 mg total) by mouth daily. 90 tablet 3   potassium chloride (KLOR-CON M) 10 MEQ tablet Take 1 tablet (10 mEq total) by mouth 2 (two) times daily. 180 tablet 3   rivaroxaban (XARELTO) 20 MG TABS tablet Take 1 tablet (20 mg total) by mouth daily with supper. 90 tablet 3   traZODone (DESYREL) 50 MG tablet Take 0.5-1 tablets (25-50 mg total) by mouth at bedtime as needed. for sleep 90 tablet 1   vitamin C (ASCORBIC ACID) 500 MG tablet Take 500 mg by mouth daily.     azithromycin (ZITHROMAX) 250 MG tablet Take two tablets on day one followed by one tablet on days 2-5 (Patient not taking: Reported on 10/17/2021) 6 each 0   No current facility-administered medications for this visit.      ___________________________________________________________________ Objective   Exam:  BP 126/70   Pulse (!) 58   Ht '5\' 11"'$  (1.803 m)   Wt 195 lb 3.2 oz (88.5 kg)   BMI 27.22 kg/m  Wt Readings from Last 3 Encounters:  10/17/21 195 lb 3.2 oz (88.5 kg)  08/16/21 195 lb 8 oz (88.7 kg)  06/20/21 202 lb 6.4  oz (91.8 kg)   ([name] weight stable since primary care visit 2 months ago)  General: Well-appearing, gets on exam table without difficulty Eyes: sclera anicteric, no redness ENT: oral mucosa moist without lesions, no cervical or supraclavicular lymphadenopathy.  Vocal quality with some mucus in the throat CV: Mostly regular with occasional premature beats, no JVD, no peripheral edema Resp: clear to auscultation bilaterally, normal RR and effort noted GI: soft, no tenderness, with active bowel sounds. No guarding or palpable organomegaly noted. Skin; warm and dry, no rash or jaundice noted Neuro: awake, alert and oriented x 3. Normal gross motor function and fluent speech  Labs:     Latest Ref Rng & Units 08/16/2021   10:22 AM 05/20/2021   11:47 AM 05/09/2021    1:23 PM  CBC  WBC 4.0 - 10.5 K/uL 7.3  8.4  6.8   Hemoglobin 13.0 - 17.0 g/dL 16.9  18.3  17.3   Hematocrit 39.0 - 52.0 % 50.3  51.5  50.5   Platelets 150.0 - 400.0 K/uL 160.0  138  151    (Prior Hematology evaluation of thrombocytopenia by Dr. Rondel Oh, felt to be benign variant versus ITP)     Latest Ref Rng & Units 08/16/2021   10:22 AM 06/12/2021   12:00 AM 05/20/2021   11:47 AM  CMP  Glucose 70 - 99 mg/dL 103   104   BUN 6 - 23 mg/dL 17   21   Creatinine 0.40 - 1.50 mg/dL 0.98   1.09   Sodium 135 - 145 mEq/L 134   135   Potassium 3.5 - 5.1 mEq/L 4.5   4.8   Chloride 96 - 112 mEq/L 98   98  CO2 19 - 32 mEq/L 28   23   Calcium 8.4 - 10.5 mg/dL 9.3   9.1   Total Protein 6.0 - 8.3 g/dL 6.3  6.9  6.1   Total Bilirubin 0.2 - 1.2 mg/dL 0.6  0.7  0.7   Alkaline Phos 39 - 117 U/L 64  71  66   AST 0 - 37 U/L '18  23  18   '$ ALT 0 - 53 U/L '14  16  17    '$ Albumin 4.1  Radiologic Studies:  CLINICAL DATA:  Intermittent dysphagia, especially for pills.   EXAM: ESOPHOGRAM / BARIUM SWALLOW / BARIUM TABLET STUDY   TECHNIQUE: Combined double contrast and single contrast examination performed using effervescent crystals, thick  barium liquid, and thin barium liquid. The patient was observed with fluoroscopy swallowing a 13 mm barium sulphate tablet.   FLUOROSCOPY: Radiation Exposure Index (as provided by the fluoroscopic device): 12.7 mGy Kerma   COMPARISON:  Chest radiographs 12/20/2020.  Chest CT 03/03/2017.   FINDINGS: The patient swallowed the barium without difficulty. Rapid sequence imaging of the pharynx in the AP and lateral projections demonstrates no laryngeal penetration or mucosal abnormalities. There is a prominent cricopharyngeal impression on the posterior esophagus without ulceration. Prior two level cervical fusion noted.   The esophageal motility is normal. There is no evidence of esophageal mass, stricture or ulceration. There is a small reducible hiatal hernia, only visualized with the water siphon test. Mild gastroesophageal reflux was elicited with the water siphon test.   At the conclusion of the study, a 13 mm barium tablet was administered. This passed without delay into the stomach.   IMPRESSION: 1. Mild gastroesophageal reflux with associated prominent cricopharyngeal impression on the esophagus. 2. No evidence of stricture, mass or ulceration. Barium tablet passed without delay into the stomach.     Electronically Signed   By: Richardean Sale M.D.   On: 08/27/2021 08:37   Assessment: Encounter Diagnoses  Name Primary?   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  I explained the nature of that to him, and I feel that it is not amenable to endoscopic dilation.  Remainder of the barium study does not show stricture, large hiatal hernia or other suggestion that there is a more distal mechanical cause of dysphagia.  Little reflux is seen. I also suspect the weight loss is unrelated to this.  While he has never been a smoker, he reports significant secondhand smoke exposure from his father  throughout his youth.  I recommended that meds be taken with yogurt or applesauce.  I also recommended drinking fluids with a straw and the use of a chin tuck maneuver.  Lastly, because of his secondhand smoking history and throat discomfort and mucus, I am referring him to ENT for an office fiberoptic exam to rule out neoplasia.  I suspect his throat symptoms are from cricopharyngeal dysfunction and allergic congestion, but I would like to be sure there is no more worrisome cause.  Again, I think the risks of endoscopic intervention outweigh the expected benefits.  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   Thank you for the courtesy of this consult.  Please call me with any questions or concerns.  Nelida Meuse III  CC: Referring provider noted above

## 2021-10-17 NOTE — Telephone Encounter (Signed)
Records faxed to Dr Johnnette Gourd. Will await appointment information

## 2021-10-17 NOTE — Patient Instructions (Addendum)
_______________________________________________________  If you are age 85 or older, your body mass index should be between 23-30. Your Body mass index is 27.22 kg/m. If this is out of the aforementioned range listed, please consider follow up with your Primary Care Provider.  If you are age 32 or younger, your body mass index should be between 19-25. Your Body mass index is 27.22 kg/m. If this is out of the aformentioned range listed, please consider follow up with your Primary Care Provider.   ________________________________________________________  The [place] [facility] providers would like to encourage you to use [facility] to communicate with providers for non-urgent requests or questions.  Due to long hold times on the telephone, sending your provider a message by [facility] may be a faster and more efficient way to get a response.  Please allow 48 business hours for a response.  Please remember that this is for non-urgent requests.  _______________________________________________________  We will send a referral to ENT. They will reach out to you schedule. Dr. Melida Quitter [facility] [address]. [place], [place] [address] [phone]  It was a pleasure to see you today!  Thank you for trusting me with your gastrointestinal care!

## 2021-10-24 NOTE — Telephone Encounter (Signed)
No appointment scheduled as of today 10-24-2021

## 2021-10-29 ENCOUNTER — Encounter (HOSPITAL_BASED_OUTPATIENT_CLINIC_OR_DEPARTMENT_OTHER): Payer: Self-pay | Admitting: Cardiovascular Disease

## 2021-10-29 ENCOUNTER — Encounter: Payer: Self-pay | Admitting: Cardiology

## 2021-10-29 ENCOUNTER — Ambulatory Visit: Payer: Medicare Other | Attending: Cardiology | Admitting: Cardiology

## 2021-10-29 VITALS — BP 100/62 | HR 62 | Ht 71.0 in | Wt 195.8 lb

## 2021-10-29 DIAGNOSIS — I4819 Other persistent atrial fibrillation: Secondary | ICD-10-CM | POA: Diagnosis not present

## 2021-10-29 DIAGNOSIS — I5022 Chronic systolic (congestive) heart failure: Secondary | ICD-10-CM | POA: Diagnosis not present

## 2021-10-29 DIAGNOSIS — D6869 Other thrombophilia: Secondary | ICD-10-CM | POA: Diagnosis not present

## 2021-10-29 MED ORDER — POTASSIUM CHLORIDE CRYS ER 10 MEQ PO TBCR: 10.0000 meq | EXTENDED_RELEASE_TABLET | Freq: Two times a day (BID) | ORAL | 3 refills | Status: DC

## 2021-10-29 NOTE — Patient Instructions (Signed)
Medication Instructions:  Your physician recommends that you continue on your current medications as directed. Please refer to the Current Medication list given to you today.  *If you need a refill on your cardiac medications before your next appointment, please call your pharmacy*   Lab Work: None ordered   Testing/Procedures: None ordered   Follow-Up: At [facility], you and your health needs are our priority.  As part of our continuing mission to provide you with exceptional heart care, we have created designated Provider Care Teams.  These Care Teams include your primary Cardiologist (physician) and Advanced Practice Providers (APPs -  Physician Assistants and Nurse Practitioners) who all work together to provide you with the care you need, when you need it.  Your next appointment:   February   2024  The format for your next appointment:   In Person  Provider:   Tommye Standard, PA-C    Thank you for choosing [facility]!!   Trinidad Curet, RN [phone]  Other Instructions   Important Information About Sugar

## 2021-10-29 NOTE — Progress Notes (Signed)
Electrophysiology Office Note   Date:  10/29/2021   ID:  Theodore [name], DOB 1934/04/10, MRN [id number]  PCP:  [name], MD  Cardiologist:  Orlinda Blalock Electrophysiologist:  Dr [name]    CC: Follow up for persistent atrial fibrillation   History of Present Illness: Theodore [name] is a 87 y.o. male who presents today for electrophysiology evaluation.     He has a history significant for hypertension, hyperlipidemia, persistent atrial fibrillation, coronary artery disease.  He is post atrial fibrillation ablation 12/19/2015.  Left heart catheterization showed coronary artery disease and he has a DES to the LAD.  He has since developed heart failure.  He is currently on optimal medical therapy.  His anticoagulation has been stopped as he has had no further episodes of atrial fibrillation.  Today, denies symptoms of palpitations, chest pain, shortness of breath, orthopnea, PND, lower extremity edema, claudication, dizziness, presyncope, syncope, bleeding, or neurologic sequela. The patient is tolerating medications without difficulties.  In May, he went into atrial fibrillation.  This was his first episode since his ablation.  He converted to sinus rhythm on his own.  He is now back on Xarelto.  He has been doing well over this last summer without major complaint.   Past Medical History:  Diagnosis Date   CAP (community acquired pneumonia) 01/19/2018   History of arthritis    History of rheumatic fever    Hypercholesterolemia    Hypertension    Nocturia    PAF (paroxysmal atrial fibrillation) [facility]) January 2013   Placed on Pradaxa. Did not require cardioversion; spontaneously converted   Peripheral edema    Persistent atrial fibrillation ([place]) 01/21/2011   S/p ablation 12/2015 NSR up until his PCI Dec 2020 when he went into AF post PCI   Right bundle branch block    SOB (shortness of breath)    Tendonitis of elbow, left    Past Surgical History:  Procedure  Laterality Date   ATRIAL FIBRILLATION ABLATION  12/19/2015   BACK SURGERY  12/24/09   fusion C3-C4   BACK SURGERY  2010   fusion L4-L5 [facility])   CARDIAC CATHETERIZATION  2009   NONOBSTRUCTIVE ATHERSCLEROTIC CORONARY DISEASE AND NORMAL  LV FUNCTION   CARDIAC CATHETERIZATION N/A 09/08/2014   Procedure: Left Heart Cath and Coronary Angiography;  Surgeon: Belva Crome, MD;  Location: [place] [facility];  Service: Cardiovascular;  Laterality: N/A;   CARDIAC CATHETERIZATION N/A 09/28/2015   Procedure: Left Heart Cath and Coronary Angiography;  Surgeon: Leonie Man, MD;  Location: [place] [facility];  Service: Cardiovascular;  Laterality: N/A;   CARDIAC CATHETERIZATION N/A 09/28/2015   Procedure: Intravascular Pressure Wire/FFR Study;  Surgeon: Leonie Man, MD;  Location: [place] [facility];  Service: Cardiovascular;  Laterality: N/A;   CARDIOVERSION N/A 10/23/2015   Procedure: CARDIOVERSION;  Surgeon: [name], MD;  Location: [facility] ENDOSCOPY;  Service: Cardiovascular;  Laterality: N/A;   COLONOSCOPY  2013   per patient, rpt 5 yrs   COLONOSCOPY WITH PROPOFOL N/A 12/02/2018   TAx2, HP, diverticulosis [name], MD)   CORONARY ATHERECTOMY N/A 01/11/2019   Procedure: CORONARY ATHERECTOMY;  Surgeon: Burnell [name], MD;  Location: [place] [facility];  Service: Cardiovascular;  Laterality: N/A;   CORONARY STENT INTERVENTION N/A 01/11/2019   Procedure: CORONARY STENT INTERVENTION;  Surgeon: Burnell [name], MD;  Location: [place] [facility];  Service: Cardiovascular;  Laterality: N/A;   ELECTROPHYSIOLOGIC STUDY N/A 12/19/2015   Procedure:  Atrial Fibrillation Ablation;  Surgeon: Bridgid Printz Meredith Leeds, MD;  Location: [place] [facility];  Service: Cardiovascular;  Laterality: N/A;   ESOPHAGOGASTRODUODENOSCOPY (EGD) WITH PROPOFOL N/A 12/02/2018   medium HH [name], MD)   INTRAVASCULAR PRESSURE WIRE/FFR STUDY N/A 01/10/2019   Procedure: INTRAVASCULAR PRESSURE WIRE/FFR  STUDY;  Surgeon: Leonie Man, MD;  Location: [place] [facility];  Service: Cardiovascular;  Laterality: N/A;   KNEE ARTHROSCOPY Left 03/2016   Dr. [name]   LEFT HEART CATH AND CORONARY ANGIOGRAPHY N/A 01/10/2019   Procedure: LEFT HEART CATH AND CORONARY ANGIOGRAPHY;  Surgeon: Leonie Man, MD;  Location: [place] [facility];  Service: Cardiovascular;  Laterality: N/A;   PATELLAR TENDON REPAIR Left 2008   POLYPECTOMY  12/02/2018   Procedure: POLYPECTOMY;  Surgeon: [name], MD;  Location: [facility] ENDOSCOPY;  Service: Endoscopy;;   REPLACEMENT TOTAL KNEE Right 2006   TOTAL KNEE ARTHROPLASTY Left 02/09/2017   Procedure: LEFT TOTAL KNEE ARTHROPLASTY, EXCISION LEFT DISTAL THIGH MASS;  Surgeon: [name], MD;  Location: [facility]S;  Service: Orthopedics;  Laterality: Left;  90 mins   TRICEPS TENDON REPAIR Left 2013     Current Outpatient Medications  Medication Sig Dispense Refill   acetaminophen (TYLENOL) 500 MG tablet Take 1 tablet (500 mg total) by mouth 3 (three) times daily as needed. 30 tablet 0   amoxicillin (AMOXIL) 500 MG tablet amoxicillin 500 mg tablet  TAKE 4 TABLETS BY MOUTH 1 HOUR PRIOR TO DENTAL APPOINTMENT     aspirin EC 81 MG tablet Take 1 tablet (81 mg total) by mouth daily. Swallow whole. 90 tablet 3   Budeson-Glycopyrrol-Formoterol (BREZTRI AEROSPHERE) 160-9-4.8 MCG/ACT AERO Inhale 2 puffs into the lungs 2 (two) times daily. 32.1 g 3   Cholecalciferol (VITAMIN D3) 2000 units TABS Take 2,000 Units by mouth daily.     ENTRESTO 97-103 MG TAKE 1 TABLET BY MOUTH TWICE  DAILY (DISCONTINUE VALSARTAN) 180 tablet 3   Evolocumab (REPATHA SURECLICK) 672 MG/ML SOAJ Inject 140 mg into the skin every 14 (fourteen) days. 6 mL 4   ezetimibe (ZETIA) 10 MG tablet TAKE 1 TABLET BY MOUTH DAILY 90 tablet 3   FARXIGA 10 MG TABS tablet TAKE 1 TABLET BY MOUTH DAILY  BEFORE BREAKFAST 90 tablet 3   furosemide (LASIX) 20 MG tablet Take 1 tablet (20 mg total) by mouth daily. 90 tablet 3    gabapentin (NEURONTIN) 300 MG capsule Take 900 mg by mouth every evening.     metoprolol succinate (TOPROL-XL) 100 MG 24 hr tablet Take 1 tablet (100 mg total) by mouth daily. Take with or immediately following a meal. 90 tablet 3   pantoprazole (PROTONIX) 40 MG tablet Take 1 tablet (40 mg total) by mouth daily. 90 tablet 3   potassium chloride (KLOR-CON M) 10 MEQ tablet Take 1 tablet (10 mEq total) by mouth 2 (two) times daily. 180 tablet 3   rivaroxaban (XARELTO) 20 MG TABS tablet Take 1 tablet (20 mg total) by mouth daily with supper. 90 tablet 3   traZODone (DESYREL) 50 MG tablet Take 0.5-1 tablets (25-50 mg total) by mouth at bedtime as needed. for sleep 90 tablet 1   vitamin C (ASCORBIC ACID) 500 MG tablet Take 500 mg by mouth daily.     No current facility-administered medications for this visit.    Allergies:   Penicillins, Amlodipine, Effexor [venlafaxine], Lisinopril, Mevacor [lovastatin], Vicodin [hydrocodone-acetaminophen], Zocor [simvastatin], and Oseltamivir phosphate   Social History:  The patient  reports that he  has never smoked. He has never used smokeless tobacco. He reports current alcohol use of about 14.0 standard drinks of alcohol per week. He reports that he does not use drugs.   Family History:  The patient's family history includes Asthma in his sister; COPD in his brother and father; Heart attack in his father; Heart disease in his brother; Hypertension in his father.   ROS:  Please see the history of present illness.   Otherwise, review of systems is positive for none.   All other systems are reviewed and negative.   PHYSICAL EXAM: VS:  BP 100/62   Pulse 62   Ht '5\' 11"'$  (1.803 m)   Wt 195 lb 12.8 oz (88.8 kg)   SpO2 97%   BMI 27.31 kg/m  , BMI Body mass index is 27.31 kg/m. GEN: Well nourished, well developed, in no acute distress  HEENT: normal  Neck: no JVD, carotid bruits, or masses Cardiac: RRR; no murmurs, rubs, or gallops,no edema  Respiratory:  clear  to auscultation bilaterally, normal work of breathing GI: soft, nontender, nondistended, + BS MS: no deformity or atrophy  Skin: warm and dry Neuro:  Strength and sensation are intact Psych: euthymic mood, full affect  EKG:  EKG is ordered today. Personal review of the ekg ordered shows sinus rhythm, right bundle branch block, rate 62   Recent Labs: 05/20/2021: TSH 2.170 08/16/2021: ALT 14; BUN 17; Creatinine, Ser 0.98; Hemoglobin 16.9; Platelets 160.0; Potassium 4.5; Sodium 134    Lipid Panel     Component Value Date/Time   CHOL 120 06/12/2021 0000   TRIG 83 06/12/2021 0000   HDL 53 06/12/2021 0000   CHOLHDL 2.3 06/12/2021 0000   CHOLHDL 3 02/07/2021 0751   VLDL 21.8 02/07/2021 0751   LDLCALC 51 06/12/2021 0000     Wt Readings from Last 3 Encounters:  10/29/21 195 lb 12.8 oz (88.8 kg)  10/17/21 195 lb 3.2 oz (88.5 kg)  08/16/21 195 lb 8 oz (88.7 kg)      Other studies Reviewed: Additional studies/ records that were reviewed today include: Cardiac monitor 07/26/2019 personally reviewed Review of the above records today demonstrates:  Predominant rhythm: sinus rhythm Average heart rate: 69 bpm Max heart rate: 113 bpm Min heart rate: 52 bpm  PACs, PVCs and ventricular bigeminy.  TTE 09/13/20  1. Left ventricular ejection fraction, by estimation, is 45 to 50%. The  left ventricle has mildly decreased function. The left ventricle  demonstrates global hypokinesis. The left ventricular internal cavity size  was moderately dilated. There is mild  eccentric left ventricular hypertrophy. Left ventricular diastolic  parameters are consistent with Grade II diastolic dysfunction  (pseudonormalization). Elevated left atrial pressure.   2. Right ventricular systolic function is mildly reduced. The right  ventricular size is mildly enlarged.   3. Left atrial size was severely dilated.   4. Right atrial size was moderately dilated.   5. The mitral valve is normal in structure. Mild  mitral valve  regurgitation.   6. The aortic valve is tricuspid. There is mild calcification of the  aortic valve. There is moderate thickening of the aortic valve. Aortic  valve regurgitation is mild. Mild aortic valve stenosis.   ASSESSMENT AND PLAN:  1.  Persistent atrial fibrillation: Status post ablation 12/19/2015.  Currently on Toprol-XL 100 mg daily.  CHA2DS2-VASc of 5.  Currently on Xarelto 20 mg daily.  Unfortunately he did have an episode of atrial fibrillation in May.  We Thea Holshouser continue Xarelto for  now.  Shimeka Bacot discuss at his next visit on whether or not he needs to continue anticoagulation.  2.  Coronary artery disease: Status post LAD stent.  No current chest pain.  Currently on aspirin 81 mg.  3.  Chronic systolic heart failure: Currently on Entresto 97/103 mg twice daily, Toprol-XL 150 mg daily.  No obvious volume overload.  4.  Hypertension: Currently well controlled  5.  Hyperlipidemia: Continue atorvastatin 40 mg per primary cardiology.  6.  Ectatic abdominal aorta: Followed by ultrasounds per primary cardiology.  7.  Secondary hypercoagulable state: Currently on Xarelto for atrial fibrillation as above.  Current medicines are reviewed at length with the patient today.   The patient does not have concerns regarding his medicines.  The following changes were made today: None  Labs/ tests ordered today include:  Orders Placed This Encounter  Procedures   EKG 12-Lead      Disposition:   FU 6 months  Signed, Vernon Ariel Meredith Leeds, MD  10/29/2021 11:59 AM     [facility] [address] [place]  [address] [phone] (office) [phone] (fax)

## 2021-10-31 NOTE — Telephone Encounter (Signed)
Patient has been scheduled for an appointment on 12-24-21 @ 845am

## 2021-11-12 ENCOUNTER — Ambulatory Visit: Payer: Medicare Other | Admitting: Oncology

## 2021-11-12 ENCOUNTER — Other Ambulatory Visit: Payer: Medicare Other

## 2021-11-14 ENCOUNTER — Inpatient Hospital Stay (HOSPITAL_BASED_OUTPATIENT_CLINIC_OR_DEPARTMENT_OTHER): Payer: Medicare Other | Admitting: Oncology

## 2021-11-14 ENCOUNTER — Inpatient Hospital Stay: Payer: Medicare Other | Attending: Oncology

## 2021-11-14 VITALS — BP 119/82 | HR 72 | Temp 98.2°F | Resp 18 | Ht 71.0 in | Wt 196.0 lb

## 2021-11-14 DIAGNOSIS — Z7901 Long term (current) use of anticoagulants: Secondary | ICD-10-CM | POA: Insufficient documentation

## 2021-11-14 DIAGNOSIS — I509 Heart failure, unspecified: Secondary | ICD-10-CM | POA: Insufficient documentation

## 2021-11-14 DIAGNOSIS — D696 Thrombocytopenia, unspecified: Secondary | ICD-10-CM | POA: Diagnosis present

## 2021-11-14 DIAGNOSIS — Z7982 Long term (current) use of aspirin: Secondary | ICD-10-CM | POA: Insufficient documentation

## 2021-11-14 DIAGNOSIS — D751 Secondary polycythemia: Secondary | ICD-10-CM | POA: Insufficient documentation

## 2021-11-14 DIAGNOSIS — I4891 Unspecified atrial fibrillation: Secondary | ICD-10-CM | POA: Diagnosis not present

## 2021-11-14 DIAGNOSIS — J449 Chronic obstructive pulmonary disease, unspecified: Secondary | ICD-10-CM | POA: Insufficient documentation

## 2021-11-14 LAB — CBC WITH DIFFERENTIAL (CANCER CENTER ONLY)
Abs Immature Granulocytes: 0.01 10*3/uL (ref 0.00–0.07)
Basophils Absolute: 0 10*3/uL (ref 0.0–0.1)
Basophils Relative: 0 %
Eosinophils Absolute: 0.1 10*3/uL (ref 0.0–0.5)
Eosinophils Relative: 2 %
HCT: 48.6 % (ref 39.0–52.0)
Hemoglobin: 17.2 g/dL — ABNORMAL HIGH (ref 13.0–17.0)
Immature Granulocytes: 0 %
Lymphocytes Relative: 31 %
Lymphs Abs: 1.8 10*3/uL (ref 0.7–4.0)
MCH: 34.7 pg — ABNORMAL HIGH (ref 26.0–34.0)
MCHC: 35.4 g/dL (ref 30.0–36.0)
MCV: 98 fL (ref 80.0–100.0)
Monocytes Absolute: 0.7 10*3/uL (ref 0.1–1.0)
Monocytes Relative: 12 %
Neutro Abs: 3.3 10*3/uL (ref 1.7–7.7)
Neutrophils Relative %: 55 %
Platelet Count: 105 10*3/uL — ABNORMAL LOW (ref 150–400)
RBC: 4.96 MIL/uL (ref 4.22–5.81)
RDW: 13 % (ref 11.5–15.5)
WBC Count: 5.9 10*3/uL (ref 4.0–10.5)
nRBC: 0 % (ref 0.0–0.2)

## 2021-11-14 NOTE — Progress Notes (Signed)
  [place] OFFICE PROGRESS NOTE   Diagnosis: Thrombocytopenia, erythrocytosis  INTERVAL HISTORY:   Mr. Franchino returns as scheduled.  He reports malaise.  He relates weight loss to taking [place].  He bruises easily.  No other bleeding.  He is taking aspirin and Balto.  He had an episode of atrial fibrillation in May and was placed on Xarelto.  No pain or recent infection.  Objective:  Vital signs in last 24 hours:  Blood pressure 119/82, pulse 72, temperature 98.2 F (36.8 C), temperature source Oral, resp. rate 18, height '5\' 11"'$  (1.803 m), weight 196 lb (88.9 kg), SpO2 96 %.   Lymphatics: No cervical, supraclavicular, axillary, or inguinal nodes Resp: Bilateral mild inspiratory/expiratory wheeze, no respiratory distress Cardio: Regular rate and rhythm, distant heart sounds GI: No hepatosplenomegaly Vascular: No leg edema    Lab Results:  Lab Results  Component Value Date   WBC 5.9 11/14/2021   HGB 17.2 (H) 11/14/2021   HCT 48.6 11/14/2021   MCV 98.0 11/14/2021   PLT 105 (L) 11/14/2021   NEUTROABS 3.3 11/14/2021    CMP  Lab Results  Component Value Date   NA 134 (L) 08/16/2021   K 4.5 08/16/2021   CL 98 08/16/2021   CO2 28 08/16/2021   GLUCOSE 103 (H) 08/16/2021   BUN 17 08/16/2021   CREATININE 0.98 08/16/2021   CALCIUM 9.3 08/16/2021   PROT 6.3 08/16/2021   ALBUMIN 4.1 08/16/2021   AST 18 08/16/2021   ALT 14 08/16/2021   ALKPHOS 64 08/16/2021   BILITOT 0.6 08/16/2021   GFRNONAA >60 01/12/2019   GFRAA >60 01/12/2019     Medications: I have reviewed the patient's current medications.   Assessment/Plan:  Thrombocytopenia-chronic  Mild erythrocytosis Negative JAK2 February 2023 Mild elevation of erythropoietin level 02/12/2021 COPD CHF History of atrial fibrillation History of iron deficiency anemia and Hemoccult positive stool   Disposition: Mr. Champine appears stable.  He has mild thrombocytopenia and mild erythrocytosis.  The  thrombocytopenia may be related to chronic ITP or alcohol use.  Platelet clumps were noted on a pathology review of the peripheral blood smear in January.  I did not see platelet clumps when he was here in April. He will discuss the indication for continuing both aspirin and Xarelto with cardiology.  The mild erythrocytosis is likely related to COPD, CHF, and polypharmacy.  JAK2 testing was negative in February.  Mr. Pacitti will return for an office and lab visit in 3 months.  We will check a myeloma panel when he is here in 3 months.  A faint band was noted on serum protein electrophoresis in 2021.  I have a low suspicion for a hematologic malignancy.  Betsy Coder, MD  11/14/2021  10:31 AM

## 2021-12-19 NOTE — Progress Notes (Signed)
Subjective:    Patient ID: Theodore [name], male    DOB: [birth year], 87 y.o.   MRN: [id number] HPI male never smoker followed for COPD/bronchitis, complicated by HBP, peripheral edema, GERD, A. Fib/ ablation, CAD, PE/DVT,  PFT 2011 FEV1/FVC 0.65.    PFT 11/30/13- moderate obstruction, insignificant response to bronchodilator, airtrapping, minimal diffusion defect. FEV1 2.21/ 70%, FEV1/FVC 0.63, TLC 97%, DLCO 78%. EKG 03/19/2018-sinus bradycardia with frequent multifocal PVCs/ventricular trigeminy PFT 10/15/2018- Moderate obstruction with response to BD, Airtrapping, Normal Diffusion (Chronic bronchitis pattern) -----------------------------------------------------------------------------------------   12/20/20-  87 year old male never smoker followed for COPD/bronchitis, complicated by HBP, peripheral edema, GERD, A. Fib/ Xarelto, PE/DVT (2019), CAD/ stent, Peripheral Neuropathy,  -Spiriva, Xarelto/ Plavix, Breztri/ spacer Covid vax- 4 Phizer Flu vax- had -----Patient needs refills on Breztri. Productive cough with beige sputum. States he has a pain in his back that goes to the front sometimes.  Has avoided Covid infection. He reports mostly constant mild R subscapular pain seeming to radiate through to front under costal margin, present x couple of months, not progressive. He doesn't relate it to known lumbar disc disease for which he had injections hoping to relieve peripheral neuropathy. Cough is stable, chronic with some white/ darker sputum. Judithann Sauger has helped breathing better than other inhalers.  12/20/21-   87 year old male never smoker followed for COPD/bronchitis, complicated by HBP, peripheral edema, GERD, A. Fib/ Xarelto, PE/DVT (2019), CAD/ stent, Peripheral Neuropathy, Thrombocytopenia,  -Xarelto/ Plavix, Breztri/ spacer Covid vax- 5 Phizer Flu vax- had -----Pt has had a cough for the last 6 months He describes an annoying dry cough for the past 6 months intermittently.  Not bad  enough to take anything for at this point.  For about the same time as had a dull persistent ache upper thoracic spine between shoulder blades not affected by movement or lifting. Judithann Sauger has been helpful and he asks refill. He went back into atrial fibrillation about 6 months ago after successful relation 5 years ago.  Went back into atrial fibrillation again recently. CXR 12/20/20- MPRESSION: No acute abnormalities. Aortic Atherosclerosis (ICD10-I70.0  ROS-see HPI + = positive Constitutional:   No-   weight loss, night sweats, fevers, chills, fatigue, lassitude. HEENT:   No-  headaches, difficulty swallowing, tooth/dental problems, sore throat,       No-  sneezing, itching, ear ache, nasal congestion, post nasal drip,  CV:  No-   chest pain, orthopnea, PND, swelling in lower extremities, anasarca, dizziness, +palpitations Resp: + shortness of breath with exertion or at rest.               productive cough,  +non-productive cough,  No- coughing up of blood.              No-   change in color of mucus.  +wheezing.   Skin: No-   rash or lesions. GI:  No-   heartburn, indigestion, abdominal pain, nausea, vomiting,  GU:  MS:  No-   joint pain or swelling. + back pain Neuro-   + feet burn Psych:  No- change in mood or affect. No depression or anxiety.  No memory loss.  OBJ- Physical Exam General- Alert, Oriented, Affect-appropriate, Distress- none acute, looks well Skin- +ecchymoses on arms  Lymphadenopathy- none Head- atraumatic            Eyes- Gross vision intact, PERRLA, conjunctivae and secretions clear            Ears- Hearing, canals-normal  Nose- Clear, no-Septal dev, mucus, polyps, erosion, perforation             Throat- Mallampati II , mucosa clear , drainage- none, tonsils- atrophic,  Neck- flexible , trachea midline, no stridor , thyroid nl, carotid no bruit Chest - symmetrical excursion , unlabored           Heart/CV- +IRR/ AFib , no murmur , no gallop  , no rub,  nl s1 s2                           - JVD- none , edema- none, stasis changes- none, varices- none           Lung-  + raspy / bronchitic sounds on expiration, wheeze- none, cough- none here, dullness-none, rub- none           Chest wall-  upper back not tender Abd-  +RUQ and R lower costal margin not tender with no palpable liver enlargement. Br/ Gen/ Rectal- Not done, not indicated Extrem- cyanosis- none, clubbing, none, atrophy- none, strength- nl Neuro- grossly intact to observation

## 2021-12-20 ENCOUNTER — Ambulatory Visit (INDEPENDENT_AMBULATORY_CARE_PROVIDER_SITE_OTHER): Payer: Medicare Other | Admitting: Internal Medicine

## 2021-12-20 ENCOUNTER — Ambulatory Visit (INDEPENDENT_AMBULATORY_CARE_PROVIDER_SITE_OTHER): Payer: Medicare Other

## 2021-12-20 ENCOUNTER — Encounter: Payer: Self-pay | Admitting: Internal Medicine

## 2021-12-20 VITALS — BP 118/62 | HR 61 | Ht 71.0 in | Wt 198.4 lb

## 2021-12-20 DIAGNOSIS — R058 Other specified cough: Secondary | ICD-10-CM | POA: Diagnosis not present

## 2021-12-20 DIAGNOSIS — J449 Chronic obstructive pulmonary disease, unspecified: Secondary | ICD-10-CM | POA: Diagnosis not present

## 2021-12-20 DIAGNOSIS — I480 Paroxysmal atrial fibrillation: Secondary | ICD-10-CM | POA: Diagnosis not present

## 2021-12-20 MED ORDER — BREZTRI AEROSPHERE 160-9-4.8 MCG/ACT IN AERO: 2.0000 | INHALATION_SPRAY | Freq: Two times a day (BID) | RESPIRATORY_TRACT | 3 refills | Status: DC

## 2021-12-20 NOTE — Assessment & Plan Note (Signed)
Recurrent. Working with cardiology

## 2021-12-20 NOTE — Patient Instructions (Signed)
Breztri refilled   Order- CXR   dx cough, upper thoracic back pain

## 2021-12-20 NOTE — Assessment & Plan Note (Signed)
Persistent cough. Plan- refill Breztri, CXR now but suspect we may need chest CT. Delsym for now if needed.

## 2021-12-30 ENCOUNTER — Other Ambulatory Visit: Payer: Self-pay | Admitting: Family Medicine

## 2021-12-30 NOTE — Telephone Encounter (Signed)
Refill request Trazodone Last refill 07/25/21 #90/1 Last office visit 08/16/21

## 2021-12-31 ENCOUNTER — Ambulatory Visit (INDEPENDENT_AMBULATORY_CARE_PROVIDER_SITE_OTHER): Payer: Medicare Other | Admitting: Cardiovascular Disease

## 2021-12-31 ENCOUNTER — Encounter (HOSPITAL_BASED_OUTPATIENT_CLINIC_OR_DEPARTMENT_OTHER): Payer: Self-pay | Admitting: Cardiovascular Disease

## 2021-12-31 VITALS — BP 112/80 | HR 76 | Ht 71.0 in | Wt 201.0 lb

## 2021-12-31 DIAGNOSIS — I2510 Atherosclerotic heart disease of native coronary artery without angina pectoris: Secondary | ICD-10-CM

## 2021-12-31 DIAGNOSIS — I10 Essential (primary) hypertension: Secondary | ICD-10-CM | POA: Diagnosis not present

## 2021-12-31 DIAGNOSIS — Z9861 Coronary angioplasty status: Secondary | ICD-10-CM

## 2021-12-31 DIAGNOSIS — I484 Atypical atrial flutter: Secondary | ICD-10-CM | POA: Diagnosis not present

## 2021-12-31 DIAGNOSIS — E7800 Pure hypercholesterolemia, unspecified: Secondary | ICD-10-CM | POA: Diagnosis not present

## 2021-12-31 HISTORY — DX: Atypical atrial flutter: I48.4

## 2021-12-31 LAB — CBC WITH DIFFERENTIAL/PLATELET
Basophils Absolute: 0 10*3/uL (ref 0.0–0.2)
Basos: 0 %
EOS (ABSOLUTE): 0.1 10*3/uL (ref 0.0–0.4)
Eos: 1 %
Hematocrit: 48 % (ref 37.5–51.0)
Hemoglobin: 16.6 g/dL (ref 13.0–17.7)
Immature Grans (Abs): 0 10*3/uL (ref 0.0–0.1)
Immature Granulocytes: 0 %
Lymphocytes Absolute: 2.3 10*3/uL (ref 0.7–3.1)
Lymphs: 34 %
MCH: 33.7 pg — ABNORMAL HIGH (ref 26.6–33.0)
MCHC: 34.6 g/dL (ref 31.5–35.7)
MCV: 97 fL (ref 79–97)
Monocytes Absolute: 0.7 10*3/uL (ref 0.1–0.9)
Monocytes: 10 %
Neutrophils Absolute: 3.8 10*3/uL (ref 1.4–7.0)
Neutrophils: 55 %
Platelets: 156 10*3/uL (ref 150–450)
RBC: 4.93 x10E6/uL (ref 4.14–5.80)
RDW: 12.5 % (ref 11.6–15.4)
WBC: 6.8 10*3/uL (ref 3.4–10.8)

## 2021-12-31 LAB — BASIC METABOLIC PANEL
BUN/Creatinine Ratio: 19 (ref 10–24)
BUN: 20 mg/dL (ref 8–27)
CO2: 24 mmol/L (ref 20–29)
Calcium: 9.4 mg/dL (ref 8.6–10.2)
Chloride: 102 mmol/L (ref 96–106)
Creatinine, Ser: 1.05 mg/dL (ref 0.76–1.27)
Glucose: 95 mg/dL (ref 70–99)
Potassium: 4.6 mmol/L (ref 3.5–5.2)
Sodium: 139 mmol/L (ref 134–144)
eGFR: 70 mL/min/{1.73_m2} (ref >59)

## 2021-12-31 MED ORDER — REPATHA SURECLICK 140 MG/ML ~~LOC~~ SOAJ: 140.0000 mg | SUBCUTANEOUS | 4 refills | Status: DC

## 2021-12-31 NOTE — Assessment & Plan Note (Signed)
Blood pressure very well controlled.  Continue metoprolol and Entresto.

## 2021-12-31 NOTE — Progress Notes (Signed)
Cardiology Office Note   Date:  12/31/2021   ID:  Theodore [name], DOB 1934/03/19, MRN [id number]  PCP:  [name], MD  Cardiologist:  Skeet Latch, MD  Electrophysiologist:  Constance Haw, MD   Evaluation Performed:  Follow-Up Visit  Chief Complaint:  Atrial fibrillation, neuropathy  History of Present Illness:     Theodore [name] is a 87 y.o. male with CAD s/p LAD PCI, chronic diastolic heart failure, hypertension, hyperlipidemia, persistent atrial fibrillation s/p ablation, asthma and Rheumatic fever who presents for follow up.  Theodore [name] was previously a patient of Dr. Mare Ferrari.  Theodore [name] was diagnosed with atrial fibrillation in 2014.  He was initially on Pradaxa and switched to Xarelto.  He underwent ablation on 12/19/15 with Dr. [name] and has remained in sinus rhythm.  He had an episode of chest pain 08/2014 and had a Lexiscan Myoview 08/31/14 that showed an old inferior scar with peri-infarct ischemia and LVEF 49%.  He then had a LHC that revealed 70% ostial LAD, 50% prox LAD, 30% OM1 and 35% RCA.  He was managed medically.  He continued to report exertional symptoms despite medical management. He had a LHC with Dr. Ellyn Hack on 09/28/15 that again revealed a 70% ostial-proximal LAD lesion.  Dr. Ellyn Hack performed FFR on that lesion and there was no significant change.  The left ventriculogram performed at that time revealed LVEF 35-45%.  This subsequently improved to 50% on 03/2018.  He again underwent left heart cath on 01/10/2019 and there was progression of his LAD lesion.  He underwent atherectomy and PCI with drug-eluting stent placement 01/11/2019.  Since then he had noted an improvement in his breathing.  Atorvastatin was increased to 80 mg but he noted leg pain.  He was seen in the lipid clinic and started back on atorvastatin '40mg'$  and zetia 10 mg.    Theodore [name] underwent L knee replacement on  02/09/17.  Xarelto was held for 2 days prior to surgery and resumed  post-operatively.  He developed bleeding at the incision site so Xarelto was again held and he was switched to aspirin.  He subsequently developed a left lower extremity DVT and pulmonary embolism.  Xarelto was resumed. He had been struggling with peripheral neuropathy.  It started a couple weeks after the cath and he thought it was related to Plavix.  He saw Dr. [name] and switched from clopidogrel and and Xarelto to ticagrelor and Eliquis.  His neuropathy did not improve.  He followed up with his PCP and was started on gabapentin and nortriptyline. Mr. Rami complained of recurrent atrial fibrillation.  He wore a 14-day event monitor 07/2019 that showed some PACs, PVCs, and ventricular bigeminy but no atrial fibrillation.   Theodore [name] continued to suffer from symmetric burning sensations in his feet as well as shortness of breath with minimal exertion.  His LE edema was improving on 40 mg Lasix. He had reported one episode of internal bleeding, so his blood thinner was discontinued and 81 mg aspirin was started. He followed up with his neurologist Dr. Manuella Ghazi, who recommended an implant procedure in his lower back. Prior steroidal injections for nerve impingement were not helping. Given his persistent shortness of breath, he had an Echo 09/2020 that showed his LVEF had reduced to 55 to 60% with global hypokinesis.  He had grade 2 diastolic dysfunction.  He was started on [place].  Since that time he has been feeling much better.  He had a  repeat echo 12/2020 with LVEF 55-60% and indeterminate diastolic function. Right atrial pressure was 3 mmHG. He saw Dr. [name] 10/2020 and decided to hold his statin for 30 days to see if this would help his neuropathy.   He noticed improvement on [place]. He saw pharmacist 01/2021 and was started on PCSK9 inhibitor.  He saw Oda Kilts, PA-C on 05/2021 with symptoms of atrial fibrillation.  He was in A-fib for about 10 days and rates were well  controlled.  He was restarted on Xarelto and plans were made for follow-up and possible cardioversion or amiodarone.  He saw Tommye Standard, PA-C on 06/2021 and was back in sinus rhythm.  He reported low blood pressures at home after his beta-blocker was increased.  He saw Dr. [name] 10/2021 and decided to stay on Zerelto.   Today, he complains of having two episodes of atrial fibrillation. The first episode was in May and lasted 10 days. He reports that today he is currently on the 19th day of his second episode of atrial fibrillation. Due to the fatigue from his episodes he has not been able to exercise. He played golf, but did not enjoy the experience due to his symptoms attributed to atrial fibrillation. He is compliantly taking his Xarelto and aspirin. He denies any palpitations, chest pain, or peripheral edema. No lightheadedness, headaches, syncope, orthopnea, or PND.  Past Medical History:  Diagnosis Date   Atypical atrial flutter ([place]) 12/31/2021   CAP (community acquired pneumonia) 01/19/2018   History of arthritis    History of rheumatic fever    Hypercholesterolemia    Hypertension    Nocturia    PAF (paroxysmal atrial fibrillation) ([place]) 02/2011   Placed on Pradaxa. Did not require cardioversion; spontaneously converted   Peripheral edema    Persistent atrial fibrillation ([place]) 01/21/2011   S/p ablation 12/2015 NSR up until his PCI Dec 2020 when he went into AF post PCI   Right bundle branch block    SOB (shortness of breath)    Tendonitis of elbow, left     Past Surgical History:  Procedure Laterality Date   ATRIAL FIBRILLATION ABLATION  12/19/2015   BACK SURGERY  12/24/09   fusion C3-C4   BACK SURGERY  2010   fusion L4-L5 [facility])   CARDIAC CATHETERIZATION  2009   NONOBSTRUCTIVE ATHERSCLEROTIC CORONARY DISEASE AND NORMAL  LV FUNCTION   CARDIAC CATHETERIZATION N/A 09/08/2014   Procedure: Left Heart Cath and Coronary Angiography;  Surgeon: Belva Crome, MD;  Location: [place] [facility];  Service: Cardiovascular;  Laterality: N/A;   CARDIAC CATHETERIZATION N/A 09/28/2015   Procedure: Left Heart Cath and Coronary Angiography;  Surgeon: Leonie Man, MD;  Location: [place] [facility];  Service: Cardiovascular;  Laterality: N/A;   CARDIAC CATHETERIZATION N/A 09/28/2015   Procedure: Intravascular Pressure Wire/FFR Study;  Surgeon: Leonie Man, MD;  Location: [place] [facility];  Service: Cardiovascular;  Laterality: N/A;   CARDIOVERSION N/A 10/23/2015   Procedure: CARDIOVERSION;  Surgeon: [name], MD;  Location: [facility];  Service: Cardiovascular;  Laterality: N/A;   COLONOSCOPY  2013   per patient, rpt 5 yrs   COLONOSCOPY WITH PROPOFOL N/A 12/02/2018   TAx2, HP, diverticulosis [name], MD)   CORONARY ATHERECTOMY N/A 01/11/2019   Procedure: CORONARY ATHERECTOMY;  Surgeon: Burnell [name], MD;  Location: [place] [facility];  Service: Cardiovascular;  Laterality: N/A;   CORONARY STENT INTERVENTION N/A 01/11/2019   Procedure: CORONARY STENT INTERVENTION;  Surgeon: Burnell [name], MD;  Location: [place] [facility];  Service: Cardiovascular;  Laterality: N/A;   ELECTROPHYSIOLOGIC STUDY N/A 12/19/2015   Procedure: Atrial Fibrillation Ablation;  Surgeon: [name], MD;  Location: [place] [facility];  Service: Cardiovascular;  Laterality: N/A;   ESOPHAGOGASTRODUODENOSCOPY (EGD) WITH PROPOFOL N/A 12/02/2018   medium HH [name], MD)   INTRAVASCULAR PRESSURE WIRE/FFR STUDY N/A 01/10/2019   Procedure: INTRAVASCULAR PRESSURE WIRE/FFR STUDY;  Surgeon: Leonie Man, MD;  Location: [place] [facility];  Service: Cardiovascular;  Laterality: N/A;   KNEE ARTHROSCOPY Left 03/2016   Dr. [name]   LEFT HEART CATH AND CORONARY ANGIOGRAPHY N/A 01/10/2019   Procedure: LEFT HEART CATH AND CORONARY ANGIOGRAPHY;  Surgeon: Leonie Man, MD;  Location: [place] [facility];  Service: Cardiovascular;  Laterality: N/A;   PATELLAR  TENDON REPAIR Left 2008   POLYPECTOMY  12/02/2018   Procedure: POLYPECTOMY;  Surgeon: [name], MD;  Location: [facility];  Service: Endoscopy;;   REPLACEMENT TOTAL KNEE Right 2006   TOTAL KNEE ARTHROPLASTY Left 02/09/2017   Procedure: LEFT TOTAL KNEE ARTHROPLASTY, EXCISION LEFT DISTAL THIGH MASS;  Surgeon: [name], MD;  Location: [facility];  Service: Orthopedics;  Laterality: Left;  90 mins   TRICEPS TENDON REPAIR Left 2013     Current Outpatient Medications  Medication Sig Dispense Refill   acetaminophen (TYLENOL) 500 MG tablet Take 1 tablet (500 mg total) by mouth 3 (three) times daily as needed. 30 tablet 0   amoxicillin (AMOXIL) 500 MG tablet      aspirin EC 81 MG tablet Take 1 tablet (81 mg total) by mouth daily. Swallow whole. 90 tablet 3   Budeson-Glycopyrrol-Formoterol (BREZTRI AEROSPHERE) 160-9-4.8 MCG/ACT AERO Inhale 2 puffs into the lungs 2 (two) times daily. 32.1 g 3   Cholecalciferol (VITAMIN D3) 2000 units TABS Take 2,000 Units by mouth daily.     ENTRESTO 97-103 MG TAKE 1 TABLET BY MOUTH TWICE  DAILY (DISCONTINUE VALSARTAN) 180 tablet 3   Evolocumab (REPATHA SURECLICK) 130 MG/ML SOAJ Inject 140 mg into the skin every 14 (fourteen) days. 6 mL 4   ezetimibe (ZETIA) 10 MG tablet TAKE 1 TABLET BY MOUTH DAILY 90 tablet 3   FARXIGA 10 MG TABS tablet TAKE 1 TABLET BY MOUTH DAILY  BEFORE BREAKFAST 90 tablet 3   furosemide (LASIX) 20 MG tablet Take 1 tablet (20 mg total) by mouth daily. 90 tablet 3   gabapentin (NEURONTIN) 300 MG capsule Take 900 mg by mouth every evening.     metoprolol succinate (TOPROL-XL) 100 MG 24 hr tablet Take 1 tablet (100 mg total) by mouth daily. Take with or immediately following a meal. 90 tablet 3   pantoprazole (PROTONIX) 40 MG tablet Take 1 tablet (40 mg total) by mouth daily. 90 tablet 3   potassium chloride (KLOR-CON M) 10 MEQ tablet Take 1 tablet (10 mEq total) by mouth 2 (two) times daily. 180 tablet 3   rivaroxaban (XARELTO) 20 MG TABS  tablet Take 1 tablet (20 mg total) by mouth daily with supper. 90 tablet 3   vitamin C (ASCORBIC ACID) 500 MG tablet Take 500 mg by mouth daily.     traZODone (DESYREL) 50 MG tablet TAKE 1/2 TO 1 TABLET BY MOUTH AT BEDTIME AS NEEDED FOR SLEEP 90 tablet 3   No current facility-administered medications for this visit.    Allergies:   Penicillins, Amlodipine, Effexor [venlafaxine], Lisinopril, Mevacor [lovastatin], Vicodin [hydrocodone-acetaminophen], Zocor [simvastatin], and Oseltamivir phosphate  Social History:  The patient  reports that he has never smoked. He has never used smokeless tobacco. He reports current alcohol use of about 14.0 standard drinks of alcohol per week. He reports that he does not use drugs.   Family History:  The patient's family history includes Asthma in his sister; COPD in his brother and father; Heart attack in his father; Heart disease in his brother; Hypertension in his father.    ROS:   Please see the history of present illness. (+) Fatigue (+) Wheezing  All other systems are reviewed and negative.    PHYSICAL EXAM: VS:  BP 112/80 (BP Location: Left Arm, Patient Position: Sitting, Cuff Size: Normal)   Pulse 76   Ht '5\' 11"'$  (1.803 m)   Wt 201 lb (91.2 kg)   BMI 28.03 kg/m  , BMI Body mass index is 28.03 kg/m. GENERAL:  Well appearing HEENT: Pupils equal round and reactive, fundi not visualized, oral mucosa unremarkable NECK:  No jugular venous distention, waveform within normal limits, carotid upstroke brisk and symmetric, no bruits, no thyromegaly LUNGS:  Clear to auscultation bilaterally.  Mild expiratory wheezing diffusely HEART: Mostly regular with occasional ectopy.[name]  PMI not displaced or sustained,S1 and S2 within normal limits, no S3, no S4, no clicks, no rubs, no murmur ABD:  Flat, positive bowel sounds normal in frequency in pitch, no bruits, no rebound, no guarding, no midline pulsatile mass, no hepatomegaly, no splenomegaly EXT:  2 plus  pulses throughout, no edema, no cyanosis no clubbing SKIN:  No rashes no nodules NEURO:  Cranial nerves II through XII grossly intact, motor grossly intact throughout PSYCH:  Cognitively intact, oriented to person place and time  EKG:  EKG is personally reviewed.  12/31/2021: Atrial flutter. Variable conduction. RBBB. 10/29/2021: Sinus rhythm, right bundle branch block, rate 62  12/26/2020: EKG was not ordered.  09/04/2020: Sinus bradycardia. Rate 59 bpm. RBBB. 1st degree AV block. LAD. 03/28/20: Sinus rhythm.  Rate 62 bpm.  Right bundle branch block. 10/30/15: Sinus rhythm.  Rate 60 bpm.  RBBB.  LAD.  09/25/15: atrial fibrillation.  Rate 84 bpm.   Echo 12/24/2020:  1. Left ventricular ejection fraction, by estimation, is 55 to 60%. Left  ventricular ejection fraction by 3D volume is 60 %. The left ventricle has  normal function. The left ventricle has no regional wall motion  abnormalities. Left ventricular diastolic   function could not be evaluated.   2. Right ventricular systolic function is normal. The right ventricular  size is normal. There is normal pulmonary artery systolic pressure. The  estimated right ventricular systolic pressure is 29.5 mmHg.   3. Left atrial size was moderately dilated.   4. Right atrial size was moderately dilated.   5. The mitral valve was not well visualized. No evidence of mitral valve  regurgitation.   6. The aortic valve is tricuspid. Aortic valve regurgitation is mild.  Aortic valve sclerosis/calcification is present, without any evidence of  aortic stenosis.   7. Aortic dilatation noted. There is borderline dilatation of the aortic  root, measuring 38 mm.   8. The inferior vena cava is normal in size with greater than 50%  respiratory variability, suggesting right atrial pressure of 3 mmHg.   Comparison(s): Changes from prior study are noted. 09/13/2020: LVEF 45-50%.   [place] AAA Duplex 05/14/2020: Summary:  Abdominal Aorta: No evidence of an  abdominal aortic aneurysm was  visualized.   Stenosis:  Stable dimensions of abdominal aorta and common and external  iliac  arteries.   Echo 10/06/2019: 1. Left ventricular ejection fraction, by estimation, is 60 to 65%. The  left ventricle has normal function. The left ventricle has no regional  wall motion abnormalities. There is moderate concentric left ventricular  hypertrophy. Left ventricular  diastolic parameters are consistent with Grade II diastolic dysfunction  (pseudonormalization).   2. Right ventricular systolic function is normal. The right ventricular  size is mildly enlarged. There is normal pulmonary artery systolic  pressure.   3. Left atrial size was mildly dilated.   4. The mitral valve is normal in structure. Trivial mitral valve  regurgitation. No evidence of mitral stenosis.   5. The aortic valve has an indeterminant number of cusps. Aortic valve  regurgitation is mild to moderate. No aortic stenosis is present.   6. The inferior vena cava is normal in size with greater than 50%  respiratory variability, suggesting right atrial pressure of 3 mmHg.  14 Day Event Monitor 07/2019:   Quality: Fair.  Baseline artifact. Predominant rhythm: sinus rhythm Average heart rate: 69 bpm Max heart rate: 113 bpm Min heart rate: 52 bpm   PACs, PVCs and ventricular bigeminy.  PCI 01/11/19: Prox LAD to Mid LAD lesion is 80% stenosed. A drug-eluting stent was successfully placed using a STENT RESOLUTE ONYX 3.5X18. Post intervention, there is a 0% residual stenosis. Mid LAD lesion is 70% stenosed. A drug-eluting stent was successfully placed using a STENT RESOLUTE ONYX 2.75X26. Post intervention, there is a 0% residual stenosis.   1. Severe, heavily calcified stenoses in the proximal and mid LAD 2. Successful orbital atherectomy of the proximal and mid LAD 3. Successful placement of 2 drug eluting stents in the proximal and mid LAD (overlapping)   Recommendations:  Continue ASA and Plavix for at least six months.   LHC 01/10/19: Prox LAD lesion is 80% stenosed. Prox LAD to Mid LAD lesion is 60% stenosed with 50% stenosed side branch in 2nd Diag. Dist LAD lesion is 45% stenosed. Ost Cx to Prox Cx lesion is 10% stenosed. 1st Mrg lesion is 30% stenosed. Prox Cx to Mid Cx lesion is 30% stenosed. ----------- Dist RCA lesion is 30% stenosed with 30% stenosed side branch in RPDA. ----------- The left ventricular systolic function is normal. The left ventricular ejection fraction is 55-65% by visual estimate.  Diagnostic Dominance: Right    Echo 03/25/18:   1. The left ventricle has a visually estimated ejection fraction of of 50%. The cavity size was normal. There is mildly increased left ventricular wall thickness. Left ventricular diastolic Doppler parameters are consistent with impaired relaxation Left  ventricular diffuse hypokinesis.  2. The right ventricle has mildly reduced systolic function. The cavity was mildly enlarged. There is no increase in right ventricular wall thickness.  3. Left atrial size was mildly dilated.  4. Right atrial size was mildly dilated.  5. The mitral valve is normal in structure. No evidence of mitral valve stenosis. Trivial regurgitation.  6. The tricuspid valve is normal in structure.  7. The aortic valve is tricuspid There is mild calcification of the aortic valve. Aortic valve regurgitation is trivial by color flow Doppler. No stenosis.  8. The pulmonic valve was normal in structure.  9. There is mild dilatation of the aortic root measuring 38 mm. 10. Right atrial pressure is estimated at 3 mmHg. 11. No complete TR doppler jet so unable to estimate PA systolic pressure.   Lexiscan Myoview 10/17/15: The left ventricular ejection fraction is moderately decreased (30-44%).  Nuclear stress EF: 39%. There was no ST segment deviation noted during stress. There is a small defect of moderate severity present in the apical  lateral and apex location and medium sized defect of moderate severity in the basal inferior, mild inferior and apical inferior location. These defects are fixed and consistent with scar. No ischemia noted. This is an intermediate risk study due to LV dysfunction.   Recent Labs: 05/20/2021: TSH 2.170 08/16/2021: ALT 14; BUN 17; Creatinine, Ser 0.98; Potassium 4.5; Sodium 134 11/14/2021: Hemoglobin 17.2; Platelet Count 105    Lipid Panel    Component Value Date/Time   CHOL 120 06/12/2021 0000   TRIG 83 06/12/2021 0000   HDL 53 06/12/2021 0000   CHOLHDL 2.3 06/12/2021 0000   CHOLHDL 3 02/07/2021 0751   VLDL 21.8 02/07/2021 0751   LDLCALC 51 06/12/2021 0000      Wt Readings from Last 3 Encounters:  12/31/21 201 lb (91.2 kg)  12/20/21 198 lb 6.4 oz (90 kg)  11/14/21 196 lb (88.9 kg)      ASSESSMENT AND PLAN:  Atypical atrial flutter (HCC) History of atrial fibrillation status post ablation.  Today he is in atypical flutter.  He is very symptomatic.  He notes that he has been in atrial flutter for the last 19 days.  Fortunately we were able to get his thought for cardioversion tomorrow.  We will also message Dr. [name] to think about antiarrhythmics or repeat ablation.  Continue Xarelto.  He has not missed any doses.  Shared Decision Making/Informed Consent The risks (stroke, cardiac arrhythmias rarely resulting in the need for a temporary or permanent pacemaker, skin irritation or burns and complications associated with conscious sedation including aspiration, arrhythmia, respiratory failure and death), benefits (restoration of normal sinus rhythm) and alternatives of a direct current cardioversion were explained in detail to Mr. Bischoff and he agrees to proceed.     Essential hypertension Blood pressure very well controlled.  Continue metoprolol and Entresto.  CAD S/P percutaneous coronary angioplasty Prior LAD PCI.  He is stable.  He also thrombocytopenia.  Continue Xarelto and  stop the aspirin.  Continue Repatha and Zetia.  Lipids are well controlled.  LDL goal less than 70.  HLD (hyperlipidemia) Lipids well controlled as above.    Current medicines are reviewed at length with the patient today.  The patient does not have concerns regarding medicines.  The following changes have been made: none  Labs/ tests ordered today include:   No orders of the defined types were placed in this encounter.    Disposition:   FU with Kaleem Sartwell C. [name], MD, [facility] in 2 months.   I,Rachel Rivera,acting as a Education administrator for Skeet Latch, MD.,have documented all relevant documentation on the behalf of Skeet Latch, MD,as directed by  Skeet Latch, MD while in the presence of Skeet Latch, MD.  I, Harrell [name], MD have reviewed all documentation for this visit.  The documentation of the exam, diagnosis, procedures, and orders on 12/31/2021 are all accurate and complete.   Signed, Kimori Tartaglia C. [name], MD, [facility]  12/31/2021 9:37 AM    [facility]

## 2021-12-31 NOTE — Patient Instructions (Addendum)
Medication Instructions:  STOP ASPIRIN   *If you need a refill on your cardiac medications before your next appointment, please call your pharmacy*  Lab Work: BMET/CBC TODAY   If you have labs (blood work) drawn today and your tests are completely normal, you will receive your results only by: MyChart Message (if you have MyChart) OR A paper copy in the mail If you have any lab test that is abnormal or we need to change your treatment, we will call you to review the results.  Testing/Procedures: CARDIOVERSION TOMORROW   Your physician has requested that you have an echocardiogram. Echocardiography is a painless test that uses sound waves to create images of your heart. It provides your doctor with information about the size and shape of your heart and how well your heart's chambers and valves are working. This procedure takes approximately one hour. There are no restrictions for this procedure. Please do NOT wear cologne, perfume, aftershave, or lotions (deodorant is allowed). Please arrive 15 minutes prior to your appointment time. IN 2 MONTHS   Follow-Up: At [facility], you and your health needs are our priority.  As part of our continuing mission to provide you with exceptional heart care, we have created designated Provider Care Teams.  These Care Teams include your primary Cardiologist (physician) and Advanced Practice Providers (APPs -  Physician Assistants and Nurse Practitioners) who all work together to provide you with the care you need, when you need it.  We recommend signing up for the patient portal called "MyChart".  Sign up information is provided on this After Visit Summary.  MyChart is used to connect with patients for Virtual Visits (Telemedicine).  Patients are able to view lab/test results, encounter notes, upcoming appointments, etc.  Non-urgent messages can be sent to your provider as well.   To learn more about what you can do with MyChart, go to  [email or web address].    Your next appointment:   AFTER ECHO IN 2 month(s)   The format for your next appointment:   In Person  Provider:   Skeet Latch, MD or Laurann Montana, NP    Other Instructions      You are scheduled for a Cardioversion on Wednesday, November 22 with Dr. [name].  Please arrive at the [facility] (Main Entrance A) at [facility]: [place], [place] [address] at 6:15 AM.   DIET:  Nothing to eat or drink after midnight except a sip of water with medications (see medication instructions below)  MEDICATION INSTRUCTIONS: HOLD LASIX (FUROSEMIDE) TOMORROW MORNING   Continue taking your anticoagulant (blood thinner): Rivaroxaban (Xarelto).  You will need to continue this after your procedure until you are told by your provider that it is safe to stop.    LABS:  TODAY   FYI:  For your safety, and to allow [place] to monitor your vital signs accurately during the surgery/procedure we request: If you have artificial nails, gel coating, SNS etc, please have those removed prior to your surgery/procedure. Not having the nail coverings /polish removed may result in cancellation or delay of your surgery/procedure.  You must have a responsible person to drive you home and stay in the waiting area during your procedure. Failure to do so could result in cancellation.  Bring your insurance cards.  *Special Note: Every effort is made to have your procedure done on time. Occasionally there are emergencies that occur at the hospital that may cause delays. Please be patient if a delay  does occur.

## 2021-12-31 NOTE — H&P (View-Only) (Signed)
Cardiology Office Note   Date:  12/31/2021   ID:  Theodore [name], DOB 12-18-1934, MRN [id number]  PCP:  [name], MD  Cardiologist:  Skeet Latch, MD  Electrophysiologist:  Constance Haw, MD   Evaluation Performed:  Follow-Up Visit  Chief Complaint:  Atrial fibrillation, neuropathy  History of Present Illness:     Theodore [name] is a 87 y.o. male with CAD s/p LAD PCI, chronic diastolic heart failure, hypertension, hyperlipidemia, persistent atrial fibrillation s/p ablation, asthma and Rheumatic fever who presents for follow up.  Theodore [name] was previously a patient of Dr. Mare Ferrari.  Theodore [name] was diagnosed with atrial fibrillation in 2014.  He was initially on Pradaxa and switched to Xarelto.  He underwent ablation on 12/19/15 with Dr. [name] and has remained in sinus rhythm.  He had an episode of chest pain 08/2014 and had a Lexiscan Myoview 08/31/14 that showed an old inferior scar with peri-infarct ischemia and LVEF 49%.  He then had a LHC that revealed 70% ostial LAD, 50% prox LAD, 30% OM1 and 35% RCA.  He was managed medically.  He continued to report exertional symptoms despite medical management. He had a LHC with Dr. Ellyn Hack on 09/28/15 that again revealed a 70% ostial-proximal LAD lesion.  Dr. Ellyn Hack performed FFR on that lesion and there was no significant change.  The left ventriculogram performed at that time revealed LVEF 35-45%.  This subsequently improved to 50% on 03/2018.  He again underwent left heart cath on 01/10/2019 and there was progression of his LAD lesion.  He underwent atherectomy and PCI with drug-eluting stent placement 01/11/2019.  Since then he had noted an improvement in his breathing.  Atorvastatin was increased to 80 mg but he noted leg pain.  He was seen in the lipid clinic and started back on atorvastatin '40mg'$  and zetia 10 mg.    Theodore [name] underwent L knee replacement on  02/09/17.  Xarelto was held for 2 days prior to surgery and resumed  post-operatively.  He developed bleeding at the incision site so Xarelto was again held and he was switched to aspirin.  He subsequently developed a left lower extremity DVT and pulmonary embolism.  Xarelto was resumed. He had been struggling with peripheral neuropathy.  It started a couple weeks after the cath and he thought it was related to Plavix.  He saw Dr. [name] and switched from clopidogrel and and Xarelto to ticagrelor and Eliquis.  His neuropathy did not improve.  He followed up with his PCP and was started on gabapentin and nortriptyline. Theodore [name] complained of recurrent atrial fibrillation.  He wore a 14-day event monitor 07/2019 that showed some PACs, PVCs, and ventricular bigeminy but no atrial fibrillation.   Theodore [name] continued to suffer from symmetric burning sensations in his feet as well as shortness of breath with minimal exertion.  His LE edema was improving on 40 mg Lasix. He had reported one episode of internal bleeding, so his blood thinner was discontinued and 81 mg aspirin was started. He followed up with his neurologist Dr. Manuella Ghazi, who recommended an implant procedure in his lower back. Prior steroidal injections for nerve impingement were not helping. Given his persistent shortness of breath, he had an Echo 09/2020 that showed his LVEF had reduced to 55 to 60% with global hypokinesis.  He had grade 2 diastolic dysfunction.  He was started on [place].  Since that time he has been feeling much better.  He had a  repeat echo 12/2020 with LVEF 55-60% and indeterminate diastolic function. Right atrial pressure was 3 mmHG. He saw Dr. [name] 10/2020 and decided to hold his statin for 30 days to see if this would help his neuropathy.   He noticed improvement on [place]. He saw pharmacist 01/2021 and was started on PCSK9 inhibitor.  He saw Oda Kilts, PA-C on 05/2021 with symptoms of atrial fibrillation.  He was in A-fib for about 10 days and rates were well  controlled.  He was restarted on Xarelto and plans were made for follow-up and possible cardioversion or amiodarone.  He saw Tommye Standard, PA-C on 06/2021 and was back in sinus rhythm.  He reported low blood pressures at home after his beta-blocker was increased.  He saw Dr. [name] 10/2021 and decided to stay on Zerelto.   Today, he complains of having two episodes of atrial fibrillation. The first episode was in May and lasted 10 days. He reports that today he is currently on the 19th day of his second episode of atrial fibrillation. Due to the fatigue from his episodes he has not been able to exercise. He played golf, but did not enjoy the experience due to his symptoms attributed to atrial fibrillation. He is compliantly taking his Xarelto and aspirin. He denies any palpitations, chest pain, or peripheral edema. No lightheadedness, headaches, syncope, orthopnea, or PND.  Past Medical History:  Diagnosis Date   Atypical atrial flutter ([place]) 12/31/2021   CAP (community acquired pneumonia) 01/19/2018   History of arthritis    History of rheumatic fever    Hypercholesterolemia    Hypertension    Nocturia    PAF (paroxysmal atrial fibrillation) ([place]) 02/2011   Placed on Pradaxa. Did not require cardioversion; spontaneously converted   Peripheral edema    Persistent atrial fibrillation ([place]) 01/21/2011   S/p ablation 12/2015 NSR up until his PCI Dec 2020 when he went into AF post PCI   Right bundle branch block    SOB (shortness of breath)    Tendonitis of elbow, left     Past Surgical History:  Procedure Laterality Date   ATRIAL FIBRILLATION ABLATION  12/19/2015   BACK SURGERY  12/24/09   fusion C3-C4   BACK SURGERY  2010   fusion L4-L5 [facility])   CARDIAC CATHETERIZATION  2009   NONOBSTRUCTIVE ATHERSCLEROTIC CORONARY DISEASE AND NORMAL  LV FUNCTION   CARDIAC CATHETERIZATION N/A 09/08/2014   Procedure: Left Heart Cath and Coronary Angiography;  Surgeon: Belva Crome, MD;  Location: [facility];  Service: Cardiovascular;  Laterality: N/A;   CARDIAC CATHETERIZATION N/A 09/28/2015   Procedure: Left Heart Cath and Coronary Angiography;  Surgeon: Leonie Man, MD;  Location: [facility];  Service: Cardiovascular;  Laterality: N/A;   CARDIAC CATHETERIZATION N/A 09/28/2015   Procedure: Intravascular Pressure Wire/FFR Study;  Surgeon: Leonie Man, MD;  Location: [facility];  Service: Cardiovascular;  Laterality: N/A;   CARDIOVERSION N/A 10/23/2015   Procedure: CARDIOVERSION;  Surgeon: [name], MD;  Location: [facility];  Service: Cardiovascular;  Laterality: N/A;   COLONOSCOPY  2013   per patient, rpt 5 yrs   COLONOSCOPY WITH PROPOFOL N/A 12/02/2018   TAx2, HP, diverticulosis [name], MD)   CORONARY ATHERECTOMY N/A 01/11/2019   Procedure: CORONARY ATHERECTOMY;  Surgeon: Burnell [name], MD;  Location: [place] [facility];  Service: Cardiovascular;  Laterality: N/A;   CORONARY STENT INTERVENTION N/A 01/11/2019   Procedure: CORONARY STENT INTERVENTION;  Surgeon: Burnell [name], MD;  Location: [place] [facility];  Service: Cardiovascular;  Laterality: N/A;   ELECTROPHYSIOLOGIC STUDY N/A 12/19/2015   Procedure: Atrial Fibrillation Ablation;  Surgeon: [name], MD;  Location: [place] [facility];  Service: Cardiovascular;  Laterality: N/A;   ESOPHAGOGASTRODUODENOSCOPY (EGD) WITH PROPOFOL N/A 12/02/2018   medium HH [name], MD)   INTRAVASCULAR PRESSURE WIRE/FFR STUDY N/A 01/10/2019   Procedure: INTRAVASCULAR PRESSURE WIRE/FFR STUDY;  Surgeon: Leonie Man, MD;  Location: [place] [facility];  Service: Cardiovascular;  Laterality: N/A;   KNEE ARTHROSCOPY Left 03/2016   Dr. [name]   LEFT HEART CATH AND CORONARY ANGIOGRAPHY N/A 01/10/2019   Procedure: LEFT HEART CATH AND CORONARY ANGIOGRAPHY;  Surgeon: Leonie Man, MD;  Location: [place];  Service: Cardiovascular;  Laterality: N/A;   PATELLAR  TENDON REPAIR Left 2008   POLYPECTOMY  12/02/2018   Procedure: POLYPECTOMY;  Surgeon: [name], MD;  Location: [facility];  Service: Endoscopy;;   REPLACEMENT TOTAL KNEE Right 2006   TOTAL KNEE ARTHROPLASTY Left 02/09/2017   Procedure: LEFT TOTAL KNEE ARTHROPLASTY, EXCISION LEFT DISTAL THIGH MASS;  Surgeon: [name], MD;  Location: [facility];  Service: Orthopedics;  Laterality: Left;  90 mins   TRICEPS TENDON REPAIR Left 2013     Current Outpatient Medications  Medication Sig Dispense Refill   acetaminophen (TYLENOL) 500 MG tablet Take 1 tablet (500 mg total) by mouth 3 (three) times daily as needed. 30 tablet 0   amoxicillin (AMOXIL) 500 MG tablet      aspirin EC 81 MG tablet Take 1 tablet (81 mg total) by mouth daily. Swallow whole. 90 tablet 3   Budeson-Glycopyrrol-Formoterol (BREZTRI AEROSPHERE) 160-9-4.8 MCG/ACT AERO Inhale 2 puffs into the lungs 2 (two) times daily. 32.1 g 3   Cholecalciferol (VITAMIN D3) 2000 units TABS Take 2,000 Units by mouth daily.     ENTRESTO 97-103 MG TAKE 1 TABLET BY MOUTH TWICE  DAILY (DISCONTINUE VALSARTAN) 180 tablet 3   Evolocumab (REPATHA SURECLICK) 263 MG/ML SOAJ Inject 140 mg into the skin every 14 (fourteen) days. 6 mL 4   ezetimibe (ZETIA) 10 MG tablet TAKE 1 TABLET BY MOUTH DAILY 90 tablet 3   FARXIGA 10 MG TABS tablet TAKE 1 TABLET BY MOUTH DAILY  BEFORE BREAKFAST 90 tablet 3   furosemide (LASIX) 20 MG tablet Take 1 tablet (20 mg total) by mouth daily. 90 tablet 3   gabapentin (NEURONTIN) 300 MG capsule Take 900 mg by mouth every evening.     metoprolol succinate (TOPROL-XL) 100 MG 24 hr tablet Take 1 tablet (100 mg total) by mouth daily. Take with or immediately following a meal. 90 tablet 3   pantoprazole (PROTONIX) 40 MG tablet Take 1 tablet (40 mg total) by mouth daily. 90 tablet 3   potassium chloride (KLOR-CON M) 10 MEQ tablet Take 1 tablet (10 mEq total) by mouth 2 (two) times daily. 180 tablet 3   rivaroxaban (XARELTO) 20 MG TABS  tablet Take 1 tablet (20 mg total) by mouth daily with supper. 90 tablet 3   vitamin C (ASCORBIC ACID) 500 MG tablet Take 500 mg by mouth daily.     traZODone (DESYREL) 50 MG tablet TAKE 1/2 TO 1 TABLET BY MOUTH AT BEDTIME AS NEEDED FOR SLEEP 90 tablet 3   No current facility-administered medications for this visit.    Allergies:   Penicillins, Amlodipine, Effexor [venlafaxine], Lisinopril, Mevacor [lovastatin], Vicodin [hydrocodone-acetaminophen], Zocor [simvastatin], and Oseltamivir phosphate  Social History:  The patient  reports that he has never smoked. He has never used smokeless tobacco. He reports current alcohol use of about 14.0 standard drinks of alcohol per week. He reports that he does not use drugs.   Family History:  The patient's family history includes Asthma in his sister; COPD in his brother and father; Heart attack in his father; Heart disease in his brother; Hypertension in his father.    ROS:   Please see the history of present illness. (+) Fatigue (+) Wheezing  All other systems are reviewed and negative.    PHYSICAL EXAM: VS:  BP 112/80 (BP Location: Left Arm, Patient Position: Sitting, Cuff Size: Normal)   Pulse 76   Ht '5\' 11"'$  (1.803 m)   Wt 201 lb (91.2 kg)   BMI 28.03 kg/m  , BMI Body mass index is 28.03 kg/m. GENERAL:  Well appearing HEENT: Pupils equal round and reactive, fundi not visualized, oral mucosa unremarkable NECK:  No jugular venous distention, waveform within normal limits, carotid upstroke brisk and symmetric, no bruits, no thyromegaly LUNGS:  Clear to auscultation bilaterally.  Mild expiratory wheezing diffusely HEART: Mostly regular with occasional ectopy.[name]  PMI not displaced or sustained,S1 and S2 within normal limits, no S3, no S4, no clicks, no rubs, no murmur ABD:  Flat, positive bowel sounds normal in frequency in pitch, no bruits, no rebound, no guarding, no midline pulsatile mass, no hepatomegaly, no splenomegaly EXT:  2 plus  pulses throughout, no edema, no cyanosis no clubbing SKIN:  No rashes no nodules NEURO:  Cranial nerves II through XII grossly intact, motor grossly intact throughout PSYCH:  Cognitively intact, oriented to person place and time  EKG:  EKG is personally reviewed.  12/31/2021: Atrial flutter. Variable conduction. RBBB. 10/29/2021: Sinus rhythm, right bundle branch block, rate 62  12/26/2020: EKG was not ordered.  09/04/2020: Sinus bradycardia. Rate 59 bpm. RBBB. 1st degree AV block. LAD. 03/28/20: Sinus rhythm.  Rate 62 bpm.  Right bundle branch block. 10/30/15: Sinus rhythm.  Rate 60 bpm.  RBBB.  LAD.  09/25/15: atrial fibrillation.  Rate 84 bpm.   Echo 12/24/2020:  1. Left ventricular ejection fraction, by estimation, is 55 to 60%. Left  ventricular ejection fraction by 3D volume is 60 %. The left ventricle has  normal function. The left ventricle has no regional wall motion  abnormalities. Left ventricular diastolic   function could not be evaluated.   2. Right ventricular systolic function is normal. The right ventricular  size is normal. There is normal pulmonary artery systolic pressure. The  estimated right ventricular systolic pressure is 09.6 mmHg.   3. Left atrial size was moderately dilated.   4. Right atrial size was moderately dilated.   5. The mitral valve was not well visualized. No evidence of mitral valve  regurgitation.   6. The aortic valve is tricuspid. Aortic valve regurgitation is mild.  Aortic valve sclerosis/calcification is present, without any evidence of  aortic stenosis.   7. Aortic dilatation noted. There is borderline dilatation of the aortic  root, measuring 38 mm.   8. The inferior vena cava is normal in size with greater than 50%  respiratory variability, suggesting right atrial pressure of 3 mmHg.   Comparison(s): Changes from prior study are noted. 09/13/2020: LVEF 45-50%.   [place] AAA Duplex 05/14/2020: Summary:  Abdominal Aorta: No evidence of an  abdominal aortic aneurysm was  visualized.   Stenosis:  Stable dimensions of abdominal aorta and common and external  iliac  arteries.   Echo 10/06/2019: 1. Left ventricular ejection fraction, by estimation, is 60 to 65%. The  left ventricle has normal function. The left ventricle has no regional  wall motion abnormalities. There is moderate concentric left ventricular  hypertrophy. Left ventricular  diastolic parameters are consistent with Grade II diastolic dysfunction  (pseudonormalization).   2. Right ventricular systolic function is normal. The right ventricular  size is mildly enlarged. There is normal pulmonary artery systolic  pressure.   3. Left atrial size was mildly dilated.   4. The mitral valve is normal in structure. Trivial mitral valve  regurgitation. No evidence of mitral stenosis.   5. The aortic valve has an indeterminant number of cusps. Aortic valve  regurgitation is mild to moderate. No aortic stenosis is present.   6. The inferior vena cava is normal in size with greater than 50%  respiratory variability, suggesting right atrial pressure of 3 mmHg.  14 Day Event Monitor 07/2019:   Quality: Fair.  Baseline artifact. Predominant rhythm: sinus rhythm Average heart rate: 69 bpm Max heart rate: 113 bpm Min heart rate: 52 bpm   PACs, PVCs and ventricular bigeminy.  PCI 01/11/19: Prox LAD to Mid LAD lesion is 80% stenosed. A drug-eluting stent was successfully placed using a STENT RESOLUTE ONYX 3.5X18. Post intervention, there is a 0% residual stenosis. Mid LAD lesion is 70% stenosed. A drug-eluting stent was successfully placed using a STENT RESOLUTE ONYX 2.75X26. Post intervention, there is a 0% residual stenosis.   1. Severe, heavily calcified stenoses in the proximal and mid LAD 2. Successful orbital atherectomy of the proximal and mid LAD 3. Successful placement of 2 drug eluting stents in the proximal and mid LAD (overlapping)   Recommendations:  Continue ASA and Plavix for at least six months.   LHC 01/10/19: Prox LAD lesion is 80% stenosed. Prox LAD to Mid LAD lesion is 60% stenosed with 50% stenosed side branch in 2nd Diag. Dist LAD lesion is 45% stenosed. Ost Cx to Prox Cx lesion is 10% stenosed. 1st Mrg lesion is 30% stenosed. Prox Cx to Mid Cx lesion is 30% stenosed. ----------- Dist RCA lesion is 30% stenosed with 30% stenosed side branch in RPDA. ----------- The left ventricular systolic function is normal. The left ventricular ejection fraction is 55-65% by visual estimate.  Diagnostic Dominance: Right    Echo 03/25/18:   1. The left ventricle has a visually estimated ejection fraction of of 50%. The cavity size was normal. There is mildly increased left ventricular wall thickness. Left ventricular diastolic Doppler parameters are consistent with impaired relaxation Left  ventricular diffuse hypokinesis.  2. The right ventricle has mildly reduced systolic function. The cavity was mildly enlarged. There is no increase in right ventricular wall thickness.  3. Left atrial size was mildly dilated.  4. Right atrial size was mildly dilated.  5. The mitral valve is normal in structure. No evidence of mitral valve stenosis. Trivial regurgitation.  6. The tricuspid valve is normal in structure.  7. The aortic valve is tricuspid There is mild calcification of the aortic valve. Aortic valve regurgitation is trivial by color flow Doppler. No stenosis.  8. The pulmonic valve was normal in structure.  9. There is mild dilatation of the aortic root measuring 38 mm. 10. Right atrial pressure is estimated at 3 mmHg. 11. No complete TR doppler jet so unable to estimate PA systolic pressure.   Lexiscan Myoview 10/17/15: The left ventricular ejection fraction is moderately decreased (30-44%).  Nuclear stress EF: 39%. There was no ST segment deviation noted during stress. There is a small defect of moderate severity present in the apical  lateral and apex location and medium sized defect of moderate severity in the basal inferior, mild inferior and apical inferior location. These defects are fixed and consistent with scar. No ischemia noted. This is an intermediate risk study due to LV dysfunction.   Recent Labs: 05/20/2021: TSH 2.170 08/16/2021: ALT 14; BUN 17; Creatinine, Ser 0.98; Potassium 4.5; Sodium 134 11/14/2021: Hemoglobin 17.2; Platelet Count 105    Lipid Panel    Component Value Date/Time   CHOL 120 06/12/2021 0000   TRIG 83 06/12/2021 0000   HDL 53 06/12/2021 0000   CHOLHDL 2.3 06/12/2021 0000   CHOLHDL 3 02/07/2021 0751   VLDL 21.8 02/07/2021 0751   LDLCALC 51 06/12/2021 0000      Wt Readings from Last 3 Encounters:  12/31/21 201 lb (91.2 kg)  12/20/21 198 lb 6.4 oz (90 kg)  11/14/21 196 lb (88.9 kg)      ASSESSMENT AND PLAN:  Atypical atrial flutter (HCC) History of atrial fibrillation status post ablation.  Today he is in atypical flutter.  He is very symptomatic.  He notes that he has been in atrial flutter for the last 19 days.  Fortunately we were able to get his thought for cardioversion tomorrow.  We will also message Dr. [name] to think about antiarrhythmics or repeat ablation.  Continue Xarelto.  He has not missed any doses.  Shared Decision Making/Informed Consent The risks (stroke, cardiac arrhythmias rarely resulting in the need for a temporary or permanent pacemaker, skin irritation or burns and complications associated with conscious sedation including aspiration, arrhythmia, respiratory failure and death), benefits (restoration of normal sinus rhythm) and alternatives of a direct current cardioversion were explained in detail to Theodore [name] and he agrees to proceed.     Essential hypertension Blood pressure very well controlled.  Continue metoprolol and Entresto.  CAD S/P percutaneous coronary angioplasty Prior LAD PCI.  He is stable.  He also thrombocytopenia.  Continue Xarelto and  stop the aspirin.  Continue Repatha and Zetia.  Lipids are well controlled.  LDL goal less than 70.  HLD (hyperlipidemia) Lipids well controlled as above.    Current medicines are reviewed at length with the patient today.  The patient does not have concerns regarding medicines.  The following changes have been made: none  Labs/ tests ordered today include:   No orders of the defined types were placed in this encounter.    Disposition:   FU with Deeandra Jerry C. [name], MD, [facility] in 2 months.   I,Rachel Rivera,acting as a Education administrator for Skeet Latch, MD.,have documented all relevant documentation on the behalf of Skeet Latch, MD,as directed by  Skeet Latch, MD while in the presence of Skeet Latch, MD.  I, [place] [name], MD have reviewed all documentation for this visit.  The documentation of the exam, diagnosis, procedures, and orders on 12/31/2021 are all accurate and complete.   Signed, Dyrell Tuccillo C. [name], MD, [facility]  12/31/2021 9:37 AM    [facility]

## 2021-12-31 NOTE — Assessment & Plan Note (Signed)
Prior LAD PCI.  He is stable.  He also thrombocytopenia.  Continue Xarelto and stop the aspirin.  Continue Repatha and Zetia.  Lipids are well controlled.  LDL goal less than 70.

## 2021-12-31 NOTE — Assessment & Plan Note (Addendum)
History of atrial fibrillation status post ablation.  Today he is in atypical flutter.  He is very symptomatic.  He notes that he has been in atrial flutter for the last 19 days.  Fortunately we were able to get his thought for cardioversion tomorrow.  We will also message Dr. [name] to think about antiarrhythmics or repeat ablation.  Continue Xarelto.  He has not missed any doses.  Shared Decision Making/Informed Consent The risks (stroke, cardiac arrhythmias rarely resulting in the need for a temporary or permanent pacemaker, skin irritation or burns and complications associated with conscious sedation including aspiration, arrhythmia, respiratory failure and death), benefits (restoration of normal sinus rhythm) and alternatives of a direct current cardioversion were explained in detail to Theodore [name] and he agrees to proceed.

## 2021-12-31 NOTE — Assessment & Plan Note (Signed)
Lipids well controlled as above.

## 2022-01-01 ENCOUNTER — Encounter (HOSPITAL_COMMUNITY)
Admission: RE | Disposition: A | Discharge status: Home / Self Care | Payer: Self-pay | Source: Home / Self Care | Attending: Cardiology

## 2022-01-01 ENCOUNTER — Ambulatory Visit (HOSPITAL_COMMUNITY)
Admission: RE | Admit: 2022-01-01 | Discharge: 2022-01-01 | Disposition: A | Discharge status: Home / Self Care | Payer: Medicare Other | Attending: Cardiology | Admitting: Cardiology

## 2022-01-01 ENCOUNTER — Ambulatory Visit (HOSPITAL_BASED_OUTPATIENT_CLINIC_OR_DEPARTMENT_OTHER): Payer: Medicare Other | Admitting: Certified Registered"

## 2022-01-01 ENCOUNTER — Ambulatory Visit (HOSPITAL_COMMUNITY): Payer: Medicare Other | Admitting: Certified Registered"

## 2022-01-01 ENCOUNTER — Other Ambulatory Visit: Payer: Self-pay

## 2022-01-01 ENCOUNTER — Encounter (HOSPITAL_COMMUNITY): Payer: Self-pay | Admitting: Cardiology

## 2022-01-01 DIAGNOSIS — I4819 Other persistent atrial fibrillation: Secondary | ICD-10-CM | POA: Diagnosis present

## 2022-01-01 DIAGNOSIS — Z96652 Presence of left artificial knee joint: Secondary | ICD-10-CM | POA: Diagnosis not present

## 2022-01-01 DIAGNOSIS — J45909 Unspecified asthma, uncomplicated: Secondary | ICD-10-CM | POA: Diagnosis not present

## 2022-01-01 DIAGNOSIS — E785 Hyperlipidemia, unspecified: Secondary | ICD-10-CM | POA: Diagnosis not present

## 2022-01-01 DIAGNOSIS — K219 Gastro-esophageal reflux disease without esophagitis: Secondary | ICD-10-CM | POA: Insufficient documentation

## 2022-01-01 DIAGNOSIS — I5032 Chronic diastolic (congestive) heart failure: Secondary | ICD-10-CM | POA: Insufficient documentation

## 2022-01-01 DIAGNOSIS — I4891 Unspecified atrial fibrillation: Secondary | ICD-10-CM | POA: Diagnosis not present

## 2022-01-01 DIAGNOSIS — I484 Atypical atrial flutter: Secondary | ICD-10-CM | POA: Diagnosis not present

## 2022-01-01 DIAGNOSIS — I2510 Atherosclerotic heart disease of native coronary artery without angina pectoris: Secondary | ICD-10-CM

## 2022-01-01 DIAGNOSIS — I110 Hypertensive heart disease with heart failure: Secondary | ICD-10-CM | POA: Insufficient documentation

## 2022-01-01 DIAGNOSIS — I10 Essential (primary) hypertension: Secondary | ICD-10-CM | POA: Diagnosis not present

## 2022-01-01 HISTORY — PX: CARDIOVERSION: SHX1299

## 2022-01-01 SURGERY — CARDIOVERSION
Anesthesia: Monitor Anesthesia Care

## 2022-01-01 MED ORDER — AMIODARONE HCL 200 MG PO TABS: 200.0000 mg | ORAL_TABLET | Freq: Two times a day (BID) | ORAL | 1 refills | Status: DC

## 2022-01-01 MED ORDER — SODIUM CHLORIDE 0.9 % IV SOLN: INTRAVENOUS | Status: DC

## 2022-01-01 MED ORDER — PROPOFOL 10 MG/ML IV BOLUS
INTRAVENOUS | Status: DC | PRN
  Administered 2022-01-01: 50 mg via INTRAVENOUS

## 2022-01-01 MED ORDER — LIDOCAINE 2% (20 MG/ML) 5 ML SYRINGE
INTRAMUSCULAR | Status: DC | PRN
  Administered 2022-01-01: 40 mg via INTRAVENOUS

## 2022-01-01 NOTE — Discharge Instructions (Signed)
Electrical Cardioversion  Electrical cardioversion is the delivery of a jolt of electricity to restore a normal rhythm to the heart. A rhythm that is too fast or is not regular keeps the heart from pumping well. In this procedure, sticky patches or metal paddles are placed on the chest to deliver electricity to the heart from a device.  If this information does not answer your questions, please call [facility] office at [phone] to clarify.   Follow these instructions at home: You may have some redness on the skin where the shocks were given.  You may apply over-the-counter hydrocortisone cream or aloe vera to alleviate skin irritation. YOU SHOULD NOT DRIVE, use power tools, machinery or perform tasks that involve climbing or major physical exertion for 24 hours (because of the sedation medicines used during the test).  Take over-the-counter and prescription medicines only as told by your health care provider. Ask your health care provider how to check your pulse. Check it often. Rest for 48 hours after the procedure or as told by your health care provider. Avoid or limit your caffeine use as told by your health care provider. Keep all follow-up visits as told by your health care provider. This is important.  FOLLOW UP:  Please also call with any specific questions about appointments or follow up tests. Electrical Cardioversion  Electrical cardioversion is the delivery of a jolt of electricity to restore a normal rhythm to the heart. A rhythm that is too fast or is not regular keeps the heart from pumping well. In this procedure, sticky patches or metal paddles are placed on the chest to deliver electricity to the heart from a device.  If this information does not answer your questions, please call [facility] office at [phone] to clarify.   Follow these instructions at home: You may have some redness on the skin where the shocks were  given.  You may apply over-the-counter hydrocortisone cream or aloe vera to alleviate skin irritation. YOU SHOULD NOT DRIVE, use power tools, machinery or perform tasks that involve climbing or major physical exertion for 24 hours (because of the sedation medicines used during the test).  Take over-the-counter and prescription medicines only as told by your health care provider. Ask your health care provider how to check your pulse. Check it often. Rest for 48 hours after the procedure or as told by your health care provider. Avoid or limit your caffeine use as told by your health care provider. Keep all follow-up visits as told by your health care provider. This is important.  FOLLOW UP:  Please also call with any specific questions about appointments or follow up tests.  

## 2022-01-01 NOTE — CV Procedure (Signed)
Procedure: Electrical Cardioversion Indications:  Atrial Fibrillation  Procedure Details:  Consent: Risks of procedure as well as the alternatives and risks of each were explained to the (patient/caregiver).  Consent for procedure obtained.  Time Out: Verified patient identification, verified procedure, site/side was marked, verified correct patient position, special equipment/implants available, medications/allergies/relevent history reviewed, required imaging and test results available. PERFORMED.  Patient placed on cardiac monitor, pulse oximetry, supplemental oxygen as necessary.  Sedation given:  lidocaine '40mg'$ ; propofol '50mg'$  Pacer pads placed anterior and posterior chest.  Cardioverted 1 time(s).  Cardioversion with synchronized biphasic 150J shock.  Evaluation: Findings: Post procedure EKG shows:  Sinus rhythm with runs of Afib Complications: None Patient did tolerate procedure well.  Given that he is in and out of NSR and Afib, will start amiodarone '200mg'$  BID and arrange for follow-up in Afib clinic.   Time Spent Directly with the Patient:  38mnutes   HFreada Bergeron11/22/2023, 7:57 AM

## 2022-01-01 NOTE — Interval H&P Note (Signed)
History and Physical Interval Note:  01/01/2022 7:33 AM  Theodore [name]  has presented today for surgery, with the diagnosis of afib.  The various methods of treatment have been discussed with the patient and family. After consideration of risks, benefits and other options for treatment, the patient has consented to  Procedure(s): CARDIOVERSION (N/A) as a surgical intervention.  The patient's history has been reviewed, patient examined, no change in status, stable for surgery.  I have reviewed the patient's chart and labs.  Questions were answered to the patient's satisfaction.     Freada Bergeron

## 2022-01-01 NOTE — Anesthesia Preprocedure Evaluation (Addendum)
Anesthesia Evaluation  Patient identified by MRN, date of birth, ID band Patient awake    Reviewed: Allergy & Precautions, NPO status , Patient's Chart, lab work & pertinent test results, reviewed documented beta blocker date and time   Airway Mallampati: I       Dental no notable dental hx. (+) Teeth Intact   Pulmonary asthma    Pulmonary exam normal breath sounds clear to auscultation       Cardiovascular hypertension, Pt. on home beta blockers + CAD  + dysrhythmias Atrial Fibrillation  Rhythm:Regular Rate:Normal     Neuro/Psych    GI/Hepatic Neg liver ROS,GERD  Medicated and Controlled,,  Endo/Other  negative endocrine ROS    Renal/GU negative Renal ROS     Musculoskeletal   Abdominal Normal abdominal exam  (+)   Peds  Hematology negative hematology ROS (+)   Anesthesia Other Findings    1. The left ventricle has a visually estimated ejection fraction of of 50%. The cavity size was normal. There is mildly increased left ventricular wall thickness. Left ventricular diastolic Doppler parameters are consistent with impaired relaxation Left  ventricular diffuse hypokinesis.   IMPRESSIONS     1. Left ventricular ejection fraction, by estimation, is 55 to 60%. Left  ventricular ejection fraction by 3D volume is 60 %. The left ventricle has  normal function. The left ventricle has no regional wall motion  abnormalities. Left ventricular diastolic   function could not be evaluated.   2. Right ventricular systolic function is normal. The right ventricular  size is normal. There is normal pulmonary artery systolic pressure. The  estimated right ventricular systolic pressure is 50.9 mmHg.   3. Left atrial size was moderately dilated.   4. Right atrial size was moderately dilated.   5. The mitral valve was not well visualized. No evidence of mitral valve  regurgitation.   6. The aortic valve is tricuspid. Aortic  valve regurgitation is mild.  Aortic valve sclerosis/calcification is present, without any evidence of  aortic stenosis.   7. Aortic dilatation noted. There is borderline dilatation of the aortic  root, measuring 38 mm.   8. The inferior vena cava is normal in size with greater than 50%  respiratory variability, suggesting right atrial pressure of 3 mmHg.   Comparison(s): Changes from prior study are noted. 09/13/2020: LVEF 45-50%.   FINDINGS   Left Ventricle: Left ventricular ejection fraction, by estimation, is 55  to 60%. Left ventricular ejection fraction by 3D volume is 60 %. The left  v    2. The right ventricle has mildly reduced systolic function. The cavity was mildly enlarged. There is no increase in right ventricular wall thickness.  3. Left atrial size was mildly dilated.  4. Right atrial size was mildly dilated.  5. The mitral valve is normal in structure. No evidence of mitral valve stenosis. Trivial regurgitation.  6. The tricuspid valve is normal in structure.  7. The aortic valve is tricuspid There is mild calcification of the aortic valve. Aortic valve regurgitation is trivial by color flow Doppler. No stenosis.  8. The pulmonic valve was normal in structure.  9. There is mild dilatation of the aortic root measuring 38 mm. 10. Right atrial pressure is estimated at 3 mmHg. 11. No complete TR doppler jet so unable to estimate PA systolic pressure.   Reproductive/Obstetrics  Anesthesia Physical Anesthesia Plan  ASA: III  Anesthesia Plan: General   Post-op Pain Management:    Induction:   PONV Risk Score and Plan:   Airway Management Planned: Natural Airway and Simple Face Mask  Additional Equipment: None  Intra-op Plan:   Post-operative Plan:   Informed Consent: I have reviewed the patients History and Physical, chart, labs and discussed the procedure including the risks, benefits and alternatives for  the proposed anesthesia with the patient or authorized representative who has indicated his/her understanding and acceptance.       Plan Discussed with: CRNA  Anesthesia Plan Comments:         Anesthesia Quick Evaluation

## 2022-01-01 NOTE — Anesthesia Postprocedure Evaluation (Signed)
Anesthesia Post Note  Patient: Theodore [name]  Procedure(s) Performed: CARDIOVERSION     Patient location during evaluation: Phase II Anesthesia Type: General Level of consciousness: awake Pain management: pain level controlled Vital Signs Assessment: post-procedure vital signs reviewed and stable Respiratory status: spontaneous breathing Cardiovascular status: stable Postop Assessment: no apparent nausea or vomiting Anesthetic complications: no  No notable events documented.  Last Vitals:  Vitals:   01/01/22 0751 01/01/22 0800  BP: (!) 102/58 114/68  Pulse: 81 66  Resp: 17 17  Temp:    SpO2: 97% 97%    Last Pain:  Vitals:   01/01/22 0800  TempSrc:   PainSc: 0-No pain                 Huston Foley

## 2022-01-01 NOTE — Transfer of Care (Signed)
Immediate Anesthesia Transfer of Care Note  Patient: Theodore [name]  Procedure(s) Performed: CARDIOVERSION  Patient Location: Endoscopy Unit  Anesthesia Type:General  Level of Consciousness: drowsy  Airway & Oxygen Therapy: Patient Spontanous Breathing  Post-op Assessment: Report given to RN  Post vital signs: Reviewed and stable  Last Vitals:  Vitals Value Taken Time  BP 95/65   Temp    Pulse 80   Resp 14   SpO2 95     Last Pain:  Vitals:   01/01/22 0652  TempSrc: Temporal      Patients Stated Pain Goal: 3 (01/64/29 0379)  Complications: No notable events documented.

## 2022-01-05 ENCOUNTER — Encounter (HOSPITAL_COMMUNITY): Payer: Self-pay | Admitting: Cardiology

## 2022-01-07 ENCOUNTER — Other Ambulatory Visit (HOSPITAL_BASED_OUTPATIENT_CLINIC_OR_DEPARTMENT_OTHER): Payer: Self-pay | Admitting: Cardiovascular Disease

## 2022-01-07 NOTE — Telephone Encounter (Signed)
Rx request sent to pharmacy.  

## 2022-01-13 ENCOUNTER — Encounter (HOSPITAL_COMMUNITY): Payer: Self-pay | Admitting: Physician Assistant

## 2022-01-13 ENCOUNTER — Ambulatory Visit (HOSPITAL_COMMUNITY)
Admission: RE | Admit: 2022-01-13 | Discharge: 2022-01-13 | Disposition: A | Discharge status: Home / Self Care | Payer: Medicare Other | Source: Ambulatory Visit | Attending: Physician Assistant | Admitting: Physician Assistant

## 2022-01-13 VITALS — BP 122/72 | HR 61 | Ht 71.0 in | Wt 195.0 lb

## 2022-01-13 DIAGNOSIS — D6869 Other thrombophilia: Secondary | ICD-10-CM | POA: Diagnosis not present

## 2022-01-13 DIAGNOSIS — I440 Atrioventricular block, first degree: Secondary | ICD-10-CM | POA: Diagnosis present

## 2022-01-13 DIAGNOSIS — E785 Hyperlipidemia, unspecified: Secondary | ICD-10-CM | POA: Diagnosis not present

## 2022-01-13 DIAGNOSIS — D6859 Other primary thrombophilia: Secondary | ICD-10-CM | POA: Diagnosis not present

## 2022-01-13 DIAGNOSIS — I4510 Unspecified right bundle-branch block: Secondary | ICD-10-CM | POA: Insufficient documentation

## 2022-01-13 DIAGNOSIS — I484 Atypical atrial flutter: Secondary | ICD-10-CM

## 2022-01-13 DIAGNOSIS — I2510 Atherosclerotic heart disease of native coronary artery without angina pectoris: Secondary | ICD-10-CM | POA: Insufficient documentation

## 2022-01-13 DIAGNOSIS — I10 Essential (primary) hypertension: Secondary | ICD-10-CM | POA: Insufficient documentation

## 2022-01-13 MED ORDER — AMIODARONE HCL 200 MG PO TABS: 200.0000 mg | ORAL_TABLET | Freq: Every day | ORAL | 1 refills | Status: DC

## 2022-01-13 NOTE — Progress Notes (Signed)
Primary Care Physician: [name], MD Primary Cardiologist: Dr [name] Primary Electrophysiologist: Dr [name] Referring Physician: Dr Karren Cobble is a 87 y.o. male with a history of CAD s/p LAD PCI, chronic diastolic heart failure, hypertension, hyperlipidemia, persistent atrial fibrillation s/p ablation, asthma and Rheumatic fever who presents for follow up in the [facility].  The patient was initially diagnosed with atrial fibrillation remotely and had an ablation in 2017. He had done well with no afib until 06/2021 with a 10 day episode and then again 12/2021. He has symptoms of fatigue when out of rhythm. He underwent DCCV on 01/01/22 which was initially successful but began going quickly in and out of rhythm. He was started on amiodarone. Patient is on Xarelto for a CHADS2VASC score of 5.  On follow up today, patient reports that he has felt more tremors since starting amiodarone. Although, he does report it has done a good job keeping him in [place]. He has not had any afib over the past week. No bleeding issues on anticoagulation.   Today, he denies symptoms of palpitations, chest pain, shortness of breath, orthopnea, PND, lower extremity edema, dizziness, presyncope, syncope, snoring, daytime somnolence, bleeding, or neurologic sequela.    Atrial Fibrillation Risk Factors:  he does not have symptoms or diagnosis of sleep apnea. he does have a history of rheumatic fever. he does not have a history of alcohol use.   he has a BMI of Body mass index is 27.2 kg/m.[name] Filed Weights   01/13/22 1405  Weight: 88.5 kg    Family History  Problem Relation Age of Onset   Heart attack Father    COPD Father    Hypertension Father    Asthma Sister    Heart disease Brother    COPD Brother    Stroke Neg Hx    Colon cancer Neg Hx    Stomach cancer Neg Hx      Atrial Fibrillation Management history:  Previous antiarrhythmic drugs:  amiodarone  Previous cardioversions: 01/01/22 Previous ablations: 2017 CHADS2VASC score: 5 Anticoagulation history: Xarelto    Past Medical History:  Diagnosis Date   Atypical atrial flutter ([place]) 12/31/2021   CAP (community acquired pneumonia) 01/19/2018   History of arthritis    History of rheumatic fever    Hypercholesterolemia    Hypertension    Nocturia    PAF (paroxysmal atrial fibrillation) ([place]) 02/2011   Placed on Pradaxa. Did not require cardioversion; spontaneously converted   Peripheral edema    Persistent atrial fibrillation ([place]) 01/21/2011   S/p ablation 12/2015 NSR up until his PCI Dec 2020 when he went into AF post PCI   Right bundle branch block    SOB (shortness of breath)    Tendonitis of elbow, left    Past Surgical History:  Procedure Laterality Date   ATRIAL FIBRILLATION ABLATION  12/19/2015   BACK SURGERY  12/24/09   fusion C3-C4   BACK SURGERY  2010   fusion L4-L5 [facility] [place])   CARDIAC CATHETERIZATION  2009   NONOBSTRUCTIVE ATHERSCLEROTIC CORONARY DISEASE AND NORMAL  LV FUNCTION   CARDIAC CATHETERIZATION N/A 09/08/2014   Procedure: Left Heart Cath and Coronary Angiography;  Surgeon: Belva Crome, MD;  Location: [place] [facility];  Service: Cardiovascular;  Laterality: N/A;   CARDIAC CATHETERIZATION N/A 09/28/2015   Procedure: Left Heart Cath and Coronary Angiography;  Surgeon: Leonie Man, MD;  Location: [place] [facility];  Service: Cardiovascular;  Laterality: N/A;   CARDIAC CATHETERIZATION N/A 09/28/2015   Procedure: Intravascular Pressure Wire/FFR Study;  Surgeon: Leonie Man, MD;  Location: [place] [facility];  Service: Cardiovascular;  Laterality: N/A;   CARDIOVERSION N/A 10/23/2015   Procedure: CARDIOVERSION;  Surgeon: [name], MD;  Location: [facility] ENDOSCOPY;  Service: Cardiovascular;  Laterality: N/A;   CARDIOVERSION N/A 01/01/2022   Procedure: CARDIOVERSION;  Surgeon: Freada Bergeron, MD;  Location: [facility] ENDOSCOPY;  Service:  Cardiovascular;  Laterality: N/A;   COLONOSCOPY  2013   per patient, rpt 5 yrs   COLONOSCOPY WITH PROPOFOL N/A 12/02/2018   TAx2, HP, diverticulosis [name], MD)   CORONARY ATHERECTOMY N/A 01/11/2019   Procedure: CORONARY ATHERECTOMY;  Surgeon: Burnell [name], MD;  Location: [place] [facility];  Service: Cardiovascular;  Laterality: N/A;   CORONARY STENT INTERVENTION N/A 01/11/2019   Procedure: CORONARY STENT INTERVENTION;  Surgeon: Burnell [name], MD;  Location: [place] [facility];  Service: Cardiovascular;  Laterality: N/A;   ELECTROPHYSIOLOGIC STUDY N/A 12/19/2015   Procedure: Atrial Fibrillation Ablation;  Surgeon: [name], MD;  Location: [place] [facility];  Service: Cardiovascular;  Laterality: N/A;   ESOPHAGOGASTRODUODENOSCOPY (EGD) WITH PROPOFOL N/A 12/02/2018   medium HH [name], MD)   INTRAVASCULAR PRESSURE WIRE/FFR STUDY N/A 01/10/2019   Procedure: INTRAVASCULAR PRESSURE WIRE/FFR STUDY;  Surgeon: Leonie Man, MD;  Location: [place] [facility];  Service: Cardiovascular;  Laterality: N/A;   KNEE ARTHROSCOPY Left 03/2016   Dr. [name]   LEFT HEART CATH AND CORONARY ANGIOGRAPHY N/A 01/10/2019   Procedure: LEFT HEART CATH AND CORONARY ANGIOGRAPHY;  Surgeon: Leonie Man, MD;  Location: [place] [facility];  Service: Cardiovascular;  Laterality: N/A;   PATELLAR TENDON REPAIR Left 2008   POLYPECTOMY  12/02/2018   Procedure: POLYPECTOMY;  Surgeon: [name], MD;  Location: [facility] ENDOSCOPY;  Service: Endoscopy;;   REPLACEMENT TOTAL KNEE Right 2006   TOTAL KNEE ARTHROPLASTY Left 02/09/2017   Procedure: LEFT TOTAL KNEE ARTHROPLASTY, EXCISION LEFT DISTAL THIGH MASS;  Surgeon: [name], MD;  Location: [facility] ORS;  Service: Orthopedics;  Laterality: Left;  90 mins   TRICEPS TENDON REPAIR Left 2013    Current Outpatient Medications  Medication Sig Dispense Refill   acetaminophen (TYLENOL) 500 MG tablet Take 1 tablet (500 mg total) by mouth  3 (three) times daily as needed. 30 tablet 0   amoxicillin (AMOXIL) 500 MG tablet Take 2,000 mg by mouth See admin instructions. Dental procedures     Budeson-Glycopyrrol-Formoterol (BREZTRI AEROSPHERE) 160-9-4.8 MCG/ACT AERO Inhale 2 puffs into the lungs 2 (two) times daily. 32.1 g 3   Cholecalciferol (VITAMIN D3) 2000 units TABS Take 2,000 Units by mouth daily.     ENTRESTO 97-103 MG TAKE 1 TABLET BY MOUTH TWICE  DAILY (DISCONTINUE VALSARTAN) 180 tablet 3   Evolocumab (REPATHA SURECLICK) 809 MG/ML SOAJ Inject 140 mg into the skin every 14 (fourteen) days. 6 mL 4   ezetimibe (ZETIA) 10 MG tablet TAKE 1 TABLET BY MOUTH DAILY 90 tablet 3   FARXIGA 10 MG TABS tablet TAKE 1 TABLET BY MOUTH DAILY  BEFORE BREAKFAST 90 tablet 3   furosemide (LASIX) 20 MG tablet Take 1 tablet (20 mg total) by mouth daily. 90 tablet 3   gabapentin (NEURONTIN) 300 MG capsule Take 900 mg by mouth every evening.     metoprolol succinate (TOPROL-XL) 100 MG 24 hr tablet Take 1 tablet (100 mg total) by mouth daily. Take with or immediately following  a meal. 90 tablet 3   pantoprazole (PROTONIX) 40 MG tablet TAKE 1 TABLET BY MOUTH DAILY 90 tablet 3   potassium chloride (KLOR-CON M) 10 MEQ tablet Take 1 tablet (10 mEq total) by mouth 2 (two) times daily. 180 tablet 3   rivaroxaban (XARELTO) 20 MG TABS tablet Take 1 tablet (20 mg total) by mouth daily with supper. 90 tablet 3   traZODone (DESYREL) 50 MG tablet TAKE 1/2 TO 1 TABLET BY MOUTH AT BEDTIME AS NEEDED FOR SLEEP 90 tablet 3   vitamin C (ASCORBIC ACID) 500 MG tablet Take 500 mg by mouth daily.     amiodarone (PACERONE) 200 MG tablet Take 1 tablet (200 mg total) by mouth daily. 90 tablet 1   No current facility-administered medications for this encounter.    Allergies  Allergen Reactions   Penicillins Anaphylaxis, Rash and Other (See Comments)    "Blistering rash" Has patient had a PCN reaction causing immediate rash, facial/tongue/throat swelling, SOB or  lightheadedness with hypotension: Yes Has patient had a PCN reaction causing severe rash involving mucus membranes or skin necrosis: No Has patient had a PCN reaction that required hospitalization: No Has patient had a PCN reaction occurring within the last 10 years: No If all of the above answers are "NO", then may proceed with Cephalosporin use.    Amlodipine Swelling and Other (See Comments)    Peripheral edema   Effexor [Venlafaxine] Other (See Comments)    Tremors/shaking   Lisinopril Cough   Mevacor [Lovastatin] Other (See Comments)    Caused cataracts and elevated liver enzymes   Vicodin [Hydrocodone-Acetaminophen] Nausea And Vomiting   Zocor [Simvastatin] Other (See Comments)    LFT increase with simvastatin and lovastatin    Tamiflu [Oseltamivir Phosphate] Itching and Rash    Social History   Socioeconomic History   Marital status: Married    Spouse name: Not on file   Number of children: Not on file   Years of education: Not on file   Highest education level: Not on file  Occupational History   Occupation: retired    Fish farm manager: RETIRED    Comment: Art gallery manager  Tobacco Use   Smoking status: Never   Smokeless tobacco: Never   Tobacco comments:    Never smoke 01/13/22  Vaping Use   Vaping Use: Never used  Substance and Sexual Activity   Alcohol use: Yes    Alcohol/week: 14.0 standard drinks of alcohol    Types: 14 Cans of beer per week    Comment: 2 beers daily 01/13/22   Drug use: No   Sexual activity: Not Currently  Other Topics Concern   Not on file  Social History Narrative   Lives with wife, no pets   Retired   Probation officer: Art gallery manager   Edu: master's   Activity: golf   Diet: good water, fruits/vegetables daily   Social Determinants of Health   Financial Resource Strain: Low Risk  (08/09/2019)   Overall Financial Resource Strain (CARDIA)    Difficulty of Paying Living Expenses: Not hard at all  Food Insecurity: No Food Insecurity  (01/08/2021)   Hunger Vital Sign    Worried About Running Out of Food in the Last Year: Never true    [place] in the Last Year: Never true  Transportation Needs: No Transportation Needs (01/08/2021)   PRAPARE - Hydrologist (Medical): No    Lack of Transportation (Non-Medical): No  Physical Activity: Insufficiently Active (01/08/2021)  Exercise Vital Sign    Days of Exercise per Week: 2 days    Minutes of Exercise per Session: 60 min  Stress: No Stress Concern Present (01/08/2021)   [place]    Feeling of Stress : Not at all  Social Connections: Moderately Integrated (01/08/2021)   Social Connection and Isolation Panel [NHANES]    Frequency of Communication with Friends and Family: More than three times a week    Frequency of Social Gatherings with Friends and Family: Three times a week    Attends Religious Services: Never    Active Member of Clubs or Organizations: Yes    Attends Archivist Meetings: More than 4 times per year    Marital Status: Married  Human resources officer Violence: Not At Risk (01/08/2021)   Humiliation, Afraid, Rape, and Kick questionnaire    Fear of Current or Ex-Partner: No    Emotionally Abused: No    Physically Abused: No    Sexually Abused: No     ROS- All systems are reviewed and negative except as per the HPI above.  Physical Exam: Vitals:   01/13/22 1405  BP: 122/72  Pulse: 61  Weight: 88.5 kg  Height: '5\' 11"'$  (1.803 m)    GEN- The patient is a well appearing elderly male, alert and oriented x 3 today.   Head- normocephalic, atraumatic Eyes-  Sclera clear, conjunctiva pink Ears- hearing intact Oropharynx- clear Neck- supple  Lungs- Clear to ausculation bilaterally, normal work of breathing Heart- Regular rate and rhythm, no murmurs, rubs or gallops  GI- soft, NT, ND, + BS Extremities- no clubbing, cyanosis, or edema MS- no  significant deformity or atrophy Skin- no rash or lesion Psych- euthymic mood, full affect Neuro- strength and sensation are intact  Wt Readings from Last 3 Encounters:  01/13/22 88.5 kg  01/01/22 88.5 kg  12/31/21 91.2 kg    EKG today demonstrates  SR, 1st degree AV block, RBBB Vent. rate 61 BPM PR interval 232 ms QRS duration 158 ms QT/QTcB 468/471 ms  Echo 12/24/20 demonstrated   1. Left ventricular ejection fraction, by estimation, is 55 to 60%. Left  ventricular ejection fraction by 3D volume is 60 %. The left ventricle has  normal function. The left ventricle has no regional wall motion  abnormalities. Left ventricular diastolic function could not be evaluated.   2. Right ventricular systolic function is normal. The right ventricular  size is normal. There is normal pulmonary artery systolic pressure. The  estimated right ventricular systolic pressure is 79.8 mmHg.   3. Left atrial size was moderately dilated.   4. Right atrial size was moderately dilated.   5. The mitral valve was not well visualized. No evidence of mitral valve  regurgitation.   6. The aortic valve is tricuspid. Aortic valve regurgitation is mild.  Aortic valve sclerosis/calcification is present, without any evidence of  aortic stenosis.   7. Aortic dilatation noted. There is borderline dilatation of the aortic  root, measuring 38 mm.   8. The inferior vena cava is normal in size with greater than 50%  respiratory variability, suggesting right atrial pressure of 3 mmHg.   Comparison(s): Changes from prior study are noted. 09/13/2020: LVEF 45-50%.   Epic records are reviewed at length today  CHA2DS2-VASc Score = 5  The patient's score is based upon: CHF History: 1 HTN History: 1 Diabetes History: 0 Stroke History: 0 Vascular Disease History: 1 Age Score: 2 Gender Score:  0       ASSESSMENT AND PLAN: 1. Persistent Atrial Fibrillation (ICD10:  I48.19) The patient's CHA2DS2-VASc score is 5,  indicating a 7.2% annual risk of stroke.   Patient in [place] today. Will decrease amiodarone to 200 mg daily to see if this helps with tremors. If he maintains SR, could consider decreasing to 100 mg daily.  Will need amio labs at follow up visit.  Continue Toprol 100 mg daily Continue Xarelto 20 mg daily  2. Secondary Hypercoagulable State (ICD10:  D68.69) The patient is at significant risk for stroke/thromboembolism based upon his CHA2DS2-VASc Score of 5.  Continue Rivaroxaban (Xarelto).   3. CAD No anginal symptoms.  4. HTN Stable, no changes today.   Follow up with Tommye Standard as scheduled.    [facility] [address] [place], [place] [address] [phone] 01/13/2022 4:44 PM

## 2022-01-21 ENCOUNTER — Ambulatory Visit (INDEPENDENT_AMBULATORY_CARE_PROVIDER_SITE_OTHER): Payer: Medicare Other | Admitting: *Deleted

## 2022-01-21 DIAGNOSIS — Z0000 Encounter for general adult medical examination without abnormal findings: Secondary | ICD-10-CM | POA: Diagnosis not present

## 2022-01-21 NOTE — Patient Instructions (Signed)
Theodore [name] , Thank you for taking time to come for your Medicare Wellness Visit. I appreciate your ongoing commitment to your health goals. Please review the following plan we discussed and let me know if I can assist you in the future.   These are the goals we discussed:  Goals      Increase physical activity     Patient Stated     Starting 08/06/18, I will continue to take medications as prescribed.      Patient Stated     08/09/2019, I will maintain and continue medications as prescribed.      Patient Stated     Would like to maintain current health status        This is a list of the screening recommended for you and due dates:  Health Maintenance  Topic Date Due   DTaP/Tdap/Td vaccine (1 - Tdap) Never done   COVID-19 Vaccine (6 - 2023-24 season) 01/06/2022   Medicare Annual Wellness Visit  01/22/2023   Pneumonia Vaccine  Completed   Flu Shot  Completed   Zoster (Shingles) Vaccine  Completed   HPV Vaccine  Aged Out    Advanced directives: not on file   Preventive Care 25 Years and Older, Male  Preventive care refers to lifestyle choices and visits with your health care provider that can promote health and wellness. What does preventive care include? A yearly physical exam. This is also called an annual well check. Dental exams once or twice a year. Routine eye exams. Ask your health care provider how often you should have your eyes checked. Personal lifestyle choices, including: Daily care of your teeth and gums. Regular physical activity. Eating a healthy diet. Avoiding tobacco and drug use. Limiting alcohol use. Practicing safe sex. Taking low doses of aspirin every day. Taking vitamin and mineral supplements as recommended by your health care provider. What happens during an annual well check? The services and screenings done by your health care provider during your annual well check will depend on your age, overall health, lifestyle risk factors, and family  history of disease. Counseling  Your health care provider may ask you questions about your: Alcohol use. Tobacco use. Drug use. Emotional well-being. Home and relationship well-being. Sexual activity. Eating habits. History of falls. Memory and ability to understand (cognition). Work and work Statistician. Screening  You may have the following tests or measurements: Height, weight, and BMI. Blood pressure. Lipid and cholesterol levels. These may be checked every 5 years, or more frequently if you are over 74 years old. Skin check. Lung cancer screening. You may have this screening every year starting at age 75 if you have a 30-pack-year history of smoking and currently smoke or have quit within the past 15 years. Fecal occult blood test (FOBT) of the stool. You may have this test every year starting at age 34. Flexible sigmoidoscopy or colonoscopy. You may have a sigmoidoscopy every 5 years or a colonoscopy every 10 years starting at age 16. Prostate cancer screening. Recommendations will vary depending on your family history and other risks. Hepatitis C blood test. Hepatitis B blood test. Sexually transmitted disease (STD) testing. Diabetes screening. This is done by checking your blood sugar (glucose) after you have not eaten for a while (fasting). You may have this done every 1-3 years. Abdominal aortic aneurysm (AAA) screening. You may need this if you are a current or former smoker. Osteoporosis. You may be screened starting at age 72 if you are at  high risk. Talk with your health care provider about your test results, treatment options, and if necessary, the need for more tests. Vaccines  Your health care provider may recommend certain vaccines, such as: Influenza vaccine. This is recommended every year. Tetanus, diphtheria, and acellular pertussis (Tdap, Td) vaccine. You may need a Td booster every 10 years. Zoster vaccine. You may need this after age 43. Pneumococcal  13-valent conjugate (PCV13) vaccine. One dose is recommended after age 50. Pneumococcal polysaccharide (PPSV23) vaccine. One dose is recommended after age 11. Talk to your health care provider about which screenings and vaccines you need and how often you need them. This information is not intended to replace advice given to you by your health care provider. Make sure you discuss any questions you have with your health care provider. Document Released: 02/23/2015 Document Revised: 10/17/2015 Document Reviewed: 11/28/2014 Elsevier Interactive Patient Education  2017 [place] Prevention in the Home Falls can cause injuries. They can happen to people of all ages. There are many things you can do to make your home safe and to help prevent falls. What can I do on the outside of my home? Regularly fix the edges of walkways and driveways and fix any cracks. Remove anything that might make you trip as you walk through a door, such as a raised step or threshold. Trim any bushes or trees on the path to your home. Use bright outdoor lighting. Clear any walking paths of anything that might make someone trip, such as rocks or tools. Regularly check to see if handrails are loose or broken. Make sure that both sides of any steps have handrails. Any raised decks and porches should have guardrails on the edges. Have any leaves, snow, or ice cleared regularly. Use sand or salt on walking paths during winter. Clean up any spills in your garage right away. This includes oil or grease spills. What can I do in the bathroom? Use night lights. Install grab bars by the toilet and in the tub and shower. Do not use towel bars as grab bars. Use non-skid mats or decals in the tub or shower. If you need to sit down in the shower, use a plastic, non-slip stool. Keep the floor dry. Clean up any water that spills on the floor as soon as it happens. Remove soap buildup in the tub or shower regularly. Attach  bath mats securely with double-sided non-slip rug tape. Do not have throw rugs and other things on the floor that can make you trip. What can I do in the bedroom? Use night lights. Make sure that you have a light by your bed that is easy to reach. Do not use any sheets or blankets that are too big for your bed. They should not hang down onto the floor. Have a firm chair that has side arms. You can use this for support while you get dressed. Do not have throw rugs and other things on the floor that can make you trip. What can I do in the kitchen? Clean up any spills right away. Avoid walking on wet floors. Keep items that you use a lot in easy-to-reach places. If you need to reach something above you, use a strong step stool that has a grab bar. Keep electrical cords out of the way. Do not use floor polish or wax that makes floors slippery. If you must use wax, use non-skid floor wax. Do not have throw rugs and other things on the floor that  can make you trip. What can I do with my stairs? Do not leave any items on the stairs. Make sure that there are handrails on both sides of the stairs and use them. Fix handrails that are broken or loose. Make sure that handrails are as long as the stairways. Check any carpeting to make sure that it is firmly attached to the stairs. Fix any carpet that is loose or worn. Avoid having throw rugs at the top or bottom of the stairs. If you do have throw rugs, attach them to the floor with carpet tape. Make sure that you have a light switch at the top of the stairs and the bottom of the stairs. If you do not have them, ask someone to add them for you. What else can I do to help prevent falls? Wear shoes that: Do not have high heels. Have rubber bottoms. Are comfortable and fit you well. Are closed at the toe. Do not wear sandals. If you use a stepladder: Make sure that it is fully opened. Do not climb a closed stepladder. Make sure that both sides of the  stepladder are locked into place. Ask someone to hold it for you, if possible. Clearly mark and make sure that you can see: Any grab bars or handrails. First and last steps. Where the edge of each step is. Use tools that help you move around (mobility aids) if they are needed. These include: Canes. Walkers. Scooters. Crutches. Turn on the lights when you go into a dark area. Replace any light bulbs as soon as they burn out. Set up your furniture so you have a clear path. Avoid moving your furniture around. If any of your floors are uneven, fix them. If there are any pets around you, be aware of where they are. Review your medicines with your doctor. Some medicines can make you feel dizzy. This can increase your chance of falling. Ask your doctor what other things that you can do to help prevent falls. This information is not intended to replace advice given to you by your health care provider. Make sure you discuss any questions you have with your health care provider. Document Released: 11/23/2008 Document Revised: 07/05/2015 Document Reviewed: 03/03/2014 Elsevier Interactive Patient Education  2017 [employer].

## 2022-01-21 NOTE — Progress Notes (Signed)
Subjective:   Theodore [name] is a 87 y.o. male who presents for Medicare Annual/Subsequent preventive examination.  I connected with  Theodore [name] on 01/21/22 by a telephone enabled telemedicine application and verified that I am speaking with the correct person using two identifiers.   I discussed the limitations of evaluation and management by telemedicine. The patient expressed understanding and agreed to proceed.  Patient location: home  Provider location: Tele-health-home    Review of Systems     Cardiac Risk Factors include: advanced age (>74mn, >>61women);male gender     Objective:    Today's Vitals   There is no height or weight on file to calculate BMI.     01/21/2022    9:04 AM 01/01/2022    6:49 AM 11/14/2021   10:15 AM 01/08/2021   10:31 AM 08/09/2019    9:09 AM 01/10/2019    7:25 AM 12/02/2018    9:49 AM  Advanced Directives  Does Patient Have a Medical Advance Directive?  Yes Yes Yes Yes Yes No  Type of Advance Directive Living will Living will [place]  Does patient want to make changes to medical advance directive?   No - Patient declined Yes (MAU/Ambulatory/Procedural Areas - Information given)  No - Guardian declined   Copy of [place]?   Yes - validated most recent copy scanned in chart (See row information) Yes - validated most recent copy scanned in chart (See row information) Yes - validated most recent copy scanned in chart (See row information) No - copy requested   Would patient like information on creating a medical advance directive?       No - Patient declined    Current Medications (verified) Outpatient Encounter Medications as of 01/21/2022  Medication Sig   acetaminophen (TYLENOL) 500 MG tablet Take 1 tablet (500 mg total) by mouth 3 (three) times daily as needed.    amiodarone (PACERONE) 200 MG tablet Take 1 tablet (200 mg total) by mouth daily.   amoxicillin (AMOXIL) 500 MG tablet Take 2,000 mg by mouth See admin instructions. Dental procedures   Budeson-Glycopyrrol-Formoterol (BREZTRI AEROSPHERE) 160-9-4.8 MCG/ACT AERO Inhale 2 puffs into the lungs 2 (two) times daily.   Cholecalciferol (VITAMIN D3) 2000 units TABS Take 2,000 Units by mouth daily.   ENTRESTO 97-103 MG TAKE 1 TABLET BY MOUTH TWICE  DAILY (DISCONTINUE VALSARTAN)   Evolocumab (REPATHA SURECLICK) 1188MG/ML SOAJ Inject 140 mg into the skin every 14 (fourteen) days.   ezetimibe (ZETIA) 10 MG tablet TAKE 1 TABLET BY MOUTH DAILY   FARXIGA 10 MG TABS tablet TAKE 1 TABLET BY MOUTH DAILY  BEFORE BREAKFAST   furosemide (LASIX) 20 MG tablet Take 1 tablet (20 mg total) by mouth daily.   gabapentin (NEURONTIN) 300 MG capsule Take 900 mg by mouth every evening.   metoprolol succinate (TOPROL-XL) 100 MG 24 hr tablet Take 1 tablet (100 mg total) by mouth daily. Take with or immediately following a meal.   pantoprazole (PROTONIX) 40 MG tablet TAKE 1 TABLET BY MOUTH DAILY   potassium chloride (KLOR-CON M) 10 MEQ tablet Take 1 tablet (10 mEq total) by mouth 2 (two) times daily.   rivaroxaban (XARELTO) 20 MG TABS tablet Take 1 tablet (20 mg total) by mouth daily with supper.   traZODone (DESYREL) 50 MG tablet TAKE 1/2 TO 1 TABLET BY MOUTH AT  BEDTIME AS NEEDED FOR SLEEP   vitamin C (ASCORBIC ACID) 500 MG tablet Take 500 mg by mouth daily.   No facility-administered encounter medications on file as of 01/21/2022.    Allergies (verified) Penicillins, Amlodipine, Effexor [venlafaxine], Lisinopril, Mevacor [lovastatin], Vicodin [hydrocodone-acetaminophen], Zocor [simvastatin], and Tamiflu [oseltamivir phosphate]   History: Past Medical History:  Diagnosis Date   Atypical atrial flutter ([place]) 12/31/2021   CAP (community acquired pneumonia) 01/19/2018   History of arthritis    History of rheumatic fever     Hypercholesterolemia    Hypertension    Nocturia    PAF (paroxysmal atrial fibrillation) ([facility]) 02/2011   Placed on Pradaxa. Did not require cardioversion; spontaneously converted   Peripheral edema    Persistent atrial fibrillation ([place]) 01/21/2011   S/p ablation 12/2015 NSR up until his PCI Dec 2020 when he went into AF post PCI   Right bundle branch block    SOB (shortness of breath)    Tendonitis of elbow, left    Past Surgical History:  Procedure Laterality Date   ATRIAL FIBRILLATION ABLATION  12/19/2015   BACK SURGERY  12/24/09   fusion C3-C4   BACK SURGERY  2010   fusion L4-L5 [facility])   CARDIAC CATHETERIZATION  2009   NONOBSTRUCTIVE ATHERSCLEROTIC CORONARY DISEASE AND NORMAL  LV FUNCTION   CARDIAC CATHETERIZATION N/A 09/08/2014   Procedure: Left Heart Cath and Coronary Angiography;  Surgeon: Belva Crome, MD;  Location: [place] [facility];  Service: Cardiovascular;  Laterality: N/A;   CARDIAC CATHETERIZATION N/A 09/28/2015   Procedure: Left Heart Cath and Coronary Angiography;  Surgeon: Leonie Man, MD;  Location: [place] [facility];  Service: Cardiovascular;  Laterality: N/A;   CARDIAC CATHETERIZATION N/A 09/28/2015   Procedure: Intravascular Pressure Wire/FFR Study;  Surgeon: Leonie Man, MD;  Location: [place] [facility];  Service: Cardiovascular;  Laterality: N/A;   CARDIOVERSION N/A 10/23/2015   Procedure: CARDIOVERSION;  Surgeon: [name], MD;  Location: [name] ENDOSCOPY;  Service: Cardiovascular;  Laterality: N/A;   CARDIOVERSION N/A 01/01/2022   Procedure: CARDIOVERSION;  Surgeon: Freada Bergeron, MD;  Location: [facility] ENDOSCOPY;  Service: Cardiovascular;  Laterality: N/A;   COLONOSCOPY  2013   per patient, rpt 5 yrs   COLONOSCOPY WITH PROPOFOL N/A 12/02/2018   TAx2, HP, diverticulosis [name], MD)   CORONARY ATHERECTOMY N/A 01/11/2019   Procedure: CORONARY ATHERECTOMY;  Surgeon: Burnell [name], MD;  Location: [place] [facility];  Service:  Cardiovascular;  Laterality: N/A;   CORONARY STENT INTERVENTION N/A 01/11/2019   Procedure: CORONARY STENT INTERVENTION;  Surgeon: Burnell [name], MD;  Location: [place] [facility];  Service: Cardiovascular;  Laterality: N/A;   ELECTROPHYSIOLOGIC STUDY N/A 12/19/2015   Procedure: Atrial Fibrillation Ablation;  Surgeon: [name], MD;  Location: [place] [facility];  Service: Cardiovascular;  Laterality: N/A;   ESOPHAGOGASTRODUODENOSCOPY (EGD) WITH PROPOFOL N/A 12/02/2018   medium HH [name], MD)   INTRAVASCULAR PRESSURE WIRE/FFR STUDY N/A 01/10/2019   Procedure: INTRAVASCULAR PRESSURE WIRE/FFR STUDY;  Surgeon: Leonie Man, MD;  Location: [place] [facility];  Service: Cardiovascular;  Laterality: N/A;   KNEE ARTHROSCOPY Left 03/2016   Dr. [name]   LEFT HEART CATH AND CORONARY ANGIOGRAPHY N/A 01/10/2019   Procedure: LEFT HEART CATH AND CORONARY ANGIOGRAPHY;  Surgeon: Leonie Man, MD;  Location: [place] [facility];  Service: Cardiovascular;  Laterality: N/A;   PATELLAR TENDON REPAIR Left 2008   POLYPECTOMY  12/02/2018   Procedure:  POLYPECTOMY;  Surgeon: [name], MD;  Location: [facility] ENDOSCOPY;  Service: Endoscopy;;   REPLACEMENT TOTAL KNEE Right 2006   TOTAL KNEE ARTHROPLASTY Left 02/09/2017   Procedure: LEFT TOTAL KNEE ARTHROPLASTY, EXCISION LEFT DISTAL THIGH MASS;  Surgeon: [name], MD;  Location: [facility] ORS;  Service: Orthopedics;  Laterality: Left;  90 mins   TRICEPS TENDON REPAIR Left 2013   Family History  Problem Relation Age of Onset   Heart attack Father    COPD Father    Hypertension Father    Asthma Sister    Heart disease Brother    COPD Brother    Stroke Neg Hx    Colon cancer Neg Hx    Stomach cancer Neg Hx    Social History   Socioeconomic History   Marital status: Married    Spouse name: Not on file   Number of children: Not on file   Years of education: Not on file   Highest education level: Not on file  Occupational History    Occupation: retired    Fish farm manager: RETIRED    Comment: Art gallery manager  Tobacco Use   Smoking status: Never   Smokeless tobacco: Never   Tobacco comments:    Never smoke 01/13/22  Vaping Use   Vaping Use: Never used  Substance and Sexual Activity   Alcohol use: Yes    Alcohol/week: 14.0 standard drinks of alcohol    Types: 14 Cans of beer per week    Comment: 2 beers daily 01/13/22   Drug use: No   Sexual activity: Not Currently  Other Topics Concern   Not on file  Social History Narrative   Lives with wife, no pets   Retired   Probation officer: Art gallery manager   Edu: master's   Activity: golf   Diet: good water, fruits/vegetables daily   Social Determinants of Health   Financial Resource Strain: Low Risk  (01/21/2022)   Overall Financial Resource Strain (CARDIA)    Difficulty of Paying Living Expenses: Not hard at all  Food Insecurity: No Food Insecurity (01/21/2022)   Hunger Vital Sign    Worried About Running Out of Food in the Last Year: Never true    [place] in the Last Year: Never true  Transportation Needs: No Transportation Needs (01/21/2022)   PRAPARE - Hydrologist (Medical): No    Lack of Transportation (Non-Medical): No  Physical Activity: Inactive (01/21/2022)   Exercise Vital Sign    Days of Exercise per Week: 0 days    Minutes of Exercise per Session: 0 min  Stress: No Stress Concern Present (01/21/2022)   [place]    Feeling of Stress : Not at all  Social Connections: Moderately Isolated (01/21/2022)   Social Connection and Isolation Panel [NHANES]    Frequency of Communication with Friends and Family: More than three times a week    Frequency of Social Gatherings with Friends and Family: Twice a week    Attends Religious Services: Never    Marine scientist or Organizations: No    Attends Archivist Meetings: Never    Marital Status:  Married    Tobacco Counseling Counseling given: Not Answered Tobacco comments: Never smoke 01/13/22   Clinical Intake:  Pre-visit preparation completed: Yes  Pain : No/denies pain     Diabetes: No  How often do you need to have someone help you when you read instructions, pamphlets,  or other written materials from your doctor or pharmacy?: 1 - Never  Diabetic?  no  Interpreter Needed?: No  Information entered by :: Leroy Kennedy LPN   Activities of Daily Living    01/21/2022    9:05 AM 01/15/2022    6:08 AM  In your present state of health, do you have any difficulty performing the following activities:  Hearing? 0 0  Vision? 0 0  Difficulty concentrating or making decisions?  0  Walking or climbing stairs? 0 0  Dressing or bathing? 0 0  Doing errands, shopping? 0 0  Preparing Food and eating ? N N  Using the Toilet? N N  In the past six months, have you accidently leaked urine? N N  Do you have problems with loss of bowel control? N N  Managing your Medications? N N  Managing your Finances? N N  Housekeeping or managing your Housekeeping? N N    Patient Care Team: [name], MD as PCP - General (Family Medicine) Constance Haw, MD as PCP - Electrophysiology (Cardiology) Skeet Latch, MD as PCP - Cardiology (Cardiology) Skeet Latch, MD as Attending Physician (Cardiology) Druscilla Brownie, MD as Consulting Physician (Dermatology) Deneise Lever, MD as Consulting Physician (Pulmonary Disease) Dingeldein, Remo Lipps, MD as Referring Physician (Ophthalmology)  Indicate any recent Medical Services you may have received from other than [facility] providers in the past year (date may be approximate).     Assessment:   This is a routine wellness examination for Elizeo.  Hearing/Vision screen Hearing Screening - Comments:: No trouble hearing Vision Screening - Comments:: Up to date [facility]  Dietary issues and exercise activities  discussed: Current Exercise Habits: The patient does not participate in regular exercise at present   Goals Addressed             This Visit's Progress    Increase physical activity         Depression Screen    01/21/2022    9:09 AM 01/08/2021   10:35 AM 08/09/2019    9:11 AM 02/14/2019    3:21 PM 08/06/2018    8:34 AM 07/23/2017    9:39 AM 01/13/2017    8:30 AM  PHQ 2/9 Scores  PHQ - 2 Score 0 0 0 0 0 0 0  PHQ- 9 Score 3  0  0 0     Fall Risk    01/21/2022    9:03 AM 01/15/2022    6:08 AM 01/08/2021   10:33 AM 08/09/2019    9:11 AM 02/14/2019    3:21 PM  Fall Risk   Falls in the past year? 1 1 0 0 0  Number falls in past yr: 1 0 0 0   Injury with Fall? 1 1 0 0   Risk for fall due to :   No Fall Risks Medication side effect   Follow up Education provided;Falls prevention discussed;Falls evaluation completed  Falls prevention discussed Falls evaluation completed;Falls prevention discussed     FALL RISK PREVENTION PERTAINING TO THE HOME:  Any stairs in or around the home? Yes  If so, are there any without handrails? No  Home free of loose throw rugs in walkways, pet beds, electrical cords, etc? Yes  Adequate lighting in your home to reduce risk of falls? Yes   ASSISTIVE DEVICES UTILIZED TO PREVENT FALLS:  Life alert? No  Use of a cane, walker or w/c? No  Grab bars in the bathroom? Yes  Shower chair or bench  in shower? Yes  Elevated toilet seat or a handicapped toilet? Yes   TIMED UP AND GO:  Was the test performed? No .    Cognitive Function:    08/09/2019    9:12 AM 08/06/2018    8:32 AM 07/23/2017    9:39 AM 07/17/2016    8:44 AM 08/07/2015    2:05 PM  MMSE - Mini Mental State Exam  Orientation to time '5 5 5 5 5  '$ Orientation to Place '5 5 5 5 5  '$ Registration '3 3 3 3 3  '$ Attention/ Calculation 5 0 0 0 0  Recall '3 3 3 3 3  '$ Language- name 2 objects  0 0 0 0  Language- repeat '1 1 1 1 1  '$ Language- follow 3 step command  0 '3 3 3  '$ Language- read & follow  direction  0 0 0 0  Write a sentence  0 0 0 0  Copy design  0 0 0 0  Total score  '17 20 20 20        '$ 01/21/2022    9:07 AM  6CIT Screen  What Year? 0 points  What month? 0 points  What time? 0 points  Count back from 20 0 points  Months in reverse 0 points  Repeat phrase 0 points  Total Score 0 points    Immunizations Immunization History  Administered Date(s) Administered   Fluad Quad(high Dose 65+) 10/31/2021   Influenza Split 11/11/2010, 11/11/2011, 11/23/2012   Influenza Whole 11/10/2009   Influenza, High Dose Seasonal PF 11/17/2016, 10/22/2017, 10/26/2018, 10/22/2019   Influenza,inj,Quad PF,6+ Mos 10/25/2015   Influenza,inj,quad, With Preservative 10/13/2016   Influenza-Unspecified 11/10/2013, 10/23/2014, 10/13/2019, 11/06/2020   PFIZER Comirnaty(Gray Top)Covid-19 Tri-Sucrose Vaccine 11/11/2021   PFIZER(Purple Top)SARS-COV-2 Vaccination 02/27/2019, 03/18/2019, 10/24/2019   Pfizer Covid-19 Vaccine Bivalent Booster 79yr & up 11/06/2020   Pneumococcal Conjugate-13 10/23/2014   Pneumococcal Polysaccharide-23 11/10/2009   Zoster Recombinat (Shingrix) 08/09/2019, 09/27/2019   Zoster, Live 02/11/2012    TDAP status: Due, Education has been provided regarding the importance of this vaccine. Advised may receive this vaccine at local pharmacy or Health Dept. Aware to provide a copy of the vaccination record if obtained from local pharmacy or Health Dept. Verbalized acceptance and understanding.  Flu Vaccine status: Up to date  Pneumococcal vaccine status: Up to date  Covid-19 vaccine status: Information provided on how to obtain vaccines.   Qualifies for Shingles Vaccine? No   Zostavax completed No   Shingrix Completed?: Yes  Screening Tests Health Maintenance  Topic Date Due   DTaP/Tdap/Td (1 - Tdap) Never done   COVID-19 Vaccine (6 - 2023-24 season) 01/06/2022   Medicare Annual Wellness (AWV)  01/22/2023   Pneumonia Vaccine 87 Years old  Completed   INFLUENZA  VACCINE  Completed   Zoster Vaccines- Shingrix  Completed   HPV VACCINES  Aged Out    Health Maintenance  Health Maintenance Due  Topic Date Due   DTaP/Tdap/Td (1 - Tdap) Never done   COVID-19 Vaccine (6 - 2023-24 season) 01/06/2022    Colorectal cancer screening: No longer required.   Lung Cancer Screening: (Low Dose CT Chest recommended if Age 85-80years, 30 pack-year currently smoking OR have quit w/in 15years.) does not qualify.   Lung Cancer Screening Referral:   Additional Screening:  Hepatitis C Screening: does not qualify;   Vision Screening: Recommended annual ophthalmology exams for early detection of glaucoma and other disorders of the eye. Is the patient up to date with  their annual eye exam?  Yes  Who is the provider or what is the name of the office in which the patient attends annual eye exams? [facility] If pt is not established with a provider, would they like to be referred to a provider to establish care? No .   Dental Screening: Recommended annual dental exams for proper oral hygiene  Community Resource Referral / Chronic Care Management: CRR required this visit?  No   CCM required this visit?  No      Plan:     I have personally reviewed and noted the following in the patient's chart:   Medical and social history Use of alcohol, tobacco or illicit drugs  Current medications and supplements including opioid prescriptions. Patient is not currently taking opioid prescriptions. Functional ability and status Nutritional status Physical activity Advanced directives List of other physicians Hospitalizations, surgeries, and ER visits in previous 12 months Vitals Screenings to include cognitive, depression, and falls Referrals and appointments  In addition, I have reviewed and discussed with patient certain preventive protocols, quality metrics, and best practice recommendations. A written personalized care plan for preventive services as well as  general preventive health recommendations were provided to patient.     Leroy Kennedy, LPN   [id number]   Nurse Notes:

## 2022-02-02 ENCOUNTER — Encounter: Payer: Self-pay | Admitting: Family Medicine

## 2022-02-15 ENCOUNTER — Other Ambulatory Visit: Payer: Self-pay | Admitting: Family Medicine

## 2022-02-15 DIAGNOSIS — E7800 Pure hypercholesterolemia, unspecified: Secondary | ICD-10-CM

## 2022-02-15 DIAGNOSIS — D500 Iron deficiency anemia secondary to blood loss (chronic): Secondary | ICD-10-CM

## 2022-02-15 DIAGNOSIS — I480 Paroxysmal atrial fibrillation: Secondary | ICD-10-CM

## 2022-02-15 DIAGNOSIS — R7303 Prediabetes: Secondary | ICD-10-CM

## 2022-02-17 ENCOUNTER — Other Ambulatory Visit (INDEPENDENT_AMBULATORY_CARE_PROVIDER_SITE_OTHER): Payer: Medicare Other

## 2022-02-17 DIAGNOSIS — R7303 Prediabetes: Secondary | ICD-10-CM | POA: Diagnosis not present

## 2022-02-17 DIAGNOSIS — E7800 Pure hypercholesterolemia, unspecified: Secondary | ICD-10-CM

## 2022-02-17 DIAGNOSIS — I480 Paroxysmal atrial fibrillation: Secondary | ICD-10-CM

## 2022-02-17 DIAGNOSIS — D500 Iron deficiency anemia secondary to blood loss (chronic): Secondary | ICD-10-CM | POA: Diagnosis not present

## 2022-02-17 LAB — CBC WITH DIFFERENTIAL/PLATELET
Basophils Absolute: 0 10*3/uL (ref 0.0–0.1)
Basophils Relative: 0.4 % (ref 0.0–3.0)
Eosinophils Absolute: 0.1 10*3/uL (ref 0.0–0.7)
Eosinophils Relative: 1.1 % (ref 0.0–5.0)
HCT: 49.4 % (ref 39.0–52.0)
Hemoglobin: 16.5 g/dL (ref 13.0–17.0)
Lymphocytes Relative: 36.5 % (ref 12.0–46.0)
Lymphs Abs: 2.1 10*3/uL (ref 0.7–4.0)
MCHC: 33.4 g/dL (ref 30.0–36.0)
MCV: 99.6 fl (ref 78.0–100.0)
Monocytes Absolute: 0.5 10*3/uL (ref 0.1–1.0)
Monocytes Relative: 8.6 % (ref 3.0–12.0)
Neutro Abs: 3.1 10*3/uL (ref 1.4–7.7)
Neutrophils Relative %: 53.4 % (ref 43.0–77.0)
Platelets: 133 10*3/uL — ABNORMAL LOW (ref 150.0–400.0)
RBC: 4.96 Mil/uL (ref 4.22–5.81)
RDW: 13.5 % (ref 11.5–15.5)
WBC: 5.7 10*3/uL (ref 4.0–10.5)

## 2022-02-17 LAB — COMPREHENSIVE METABOLIC PANEL
ALT: 12 U/L (ref 0–53)
AST: 18 U/L (ref 0–37)
Albumin: 4.2 g/dL (ref 3.5–5.2)
Alkaline Phosphatase: 44 U/L (ref 39–117)
BUN: 19 mg/dL (ref 6–23)
CO2: 28 mEq/L (ref 19–32)
Calcium: 9 mg/dL (ref 8.4–10.5)
Chloride: 102 mEq/L (ref 96–112)
Creatinine, Ser: 1.19 mg/dL (ref 0.40–1.50)
GFR: 55.73 mL/min — ABNORMAL LOW (ref >60.00)
Glucose, Bld: 108 mg/dL — ABNORMAL HIGH (ref 70–99)
Potassium: 4.4 mEq/L (ref 3.5–5.1)
Sodium: 140 mEq/L (ref 135–145)
Total Bilirubin: 0.6 mg/dL (ref 0.2–1.2)
Total Protein: 6.5 g/dL (ref 6.0–8.3)

## 2022-02-17 LAB — FERRITIN: Ferritin: 15 ng/mL — ABNORMAL LOW (ref 22.0–322.0)

## 2022-02-17 LAB — LIPID PANEL
Cholesterol: 121 mg/dL (ref 0–200)
HDL: 52.5 mg/dL (ref >39.00)
LDL Cholesterol: 50 mg/dL (ref 0–99)
NonHDL: 68.98
Total CHOL/HDL Ratio: 2
Triglycerides: 94 mg/dL (ref 0.0–149.0)
VLDL: 18.8 mg/dL (ref 0.0–40.0)

## 2022-02-17 LAB — IBC PANEL
Iron: 98 ug/dL (ref 42–165)
Saturation Ratios: 20.5 % (ref 20.0–50.0)
TIBC: 477.4 ug/dL — ABNORMAL HIGH (ref 250.0–450.0)
Transferrin: 341 mg/dL (ref 212.0–360.0)

## 2022-02-17 LAB — HEMOGLOBIN A1C: Hgb A1c MFr Bld: 5.6 % (ref 4.6–6.5)

## 2022-02-18 ENCOUNTER — Other Ambulatory Visit: Payer: Self-pay | Admitting: *Deleted

## 2022-02-18 ENCOUNTER — Inpatient Hospital Stay: Payer: Medicare Other | Attending: Oncology

## 2022-02-18 ENCOUNTER — Inpatient Hospital Stay (HOSPITAL_BASED_OUTPATIENT_CLINIC_OR_DEPARTMENT_OTHER): Payer: Medicare Other | Admitting: Oncology

## 2022-02-18 VITALS — BP 126/75 | HR 64 | Temp 98.1°F | Resp 18 | Ht 71.0 in | Wt 195.6 lb

## 2022-02-18 DIAGNOSIS — D509 Iron deficiency anemia, unspecified: Secondary | ICD-10-CM | POA: Diagnosis not present

## 2022-02-18 DIAGNOSIS — J449 Chronic obstructive pulmonary disease, unspecified: Secondary | ICD-10-CM | POA: Diagnosis not present

## 2022-02-18 DIAGNOSIS — D751 Secondary polycythemia: Secondary | ICD-10-CM | POA: Insufficient documentation

## 2022-02-18 DIAGNOSIS — D696 Thrombocytopenia, unspecified: Secondary | ICD-10-CM | POA: Diagnosis not present

## 2022-02-18 DIAGNOSIS — I509 Heart failure, unspecified: Secondary | ICD-10-CM | POA: Diagnosis not present

## 2022-02-18 DIAGNOSIS — I4891 Unspecified atrial fibrillation: Secondary | ICD-10-CM | POA: Insufficient documentation

## 2022-02-18 NOTE — Progress Notes (Signed)
  [place] OFFICE PROGRESS NOTE   Diagnosis: Erythrocytosis, thrombocytopenia  INTERVAL HISTORY:   Mr. Robarge returns as scheduled.  He reports malaise.  He bruises easily.  He continues Xarelto.  Objective:  Vital signs in last 24 hours:  Blood pressure 126/75, pulse 64, temperature 98.1 F (36.7 C), temperature source Oral, resp. rate 18, height '5\' 11"'$  (1.803 m), weight 195 lb 9.6 oz (88.7 kg), SpO2 99 %.    Lymphatics: No cervical, supraclavicular, axillary, or inguinal nodes Resp: Lungs with mild bilateral end expiratory wheeze, distant breath sounds, no respiratory distress Cardio: Regular rhythm with pauses GI: No hepatosplenomegaly Vascular: No leg edema  Skin: Ecchymoses   Lab Results:  Lab Results  Component Value Date   WBC 5.7 02/17/2022   HGB 16.5 02/17/2022   HCT 49.4 02/17/2022   MCV 99.6 02/17/2022   PLT 133.0 (L) 02/17/2022   NEUTROABS 3.1 02/17/2022    CMP  Lab Results  Component Value Date   NA 140 02/17/2022   K 4.4 02/17/2022   CL 102 02/17/2022   CO2 28 02/17/2022   GLUCOSE 108 (H) 02/17/2022   BUN 19 02/17/2022   CREATININE 1.19 02/17/2022   CALCIUM 9.0 02/17/2022   PROT 6.5 02/17/2022   ALBUMIN 4.2 02/17/2022   AST 18 02/17/2022   ALT 12 02/17/2022   ALKPHOS 44 02/17/2022   BILITOT 0.6 02/17/2022   GFRNONAA >60 01/12/2019   GFRAA >60 01/12/2019   Ferritin-15, serum iron 98, TIBC 477, percent saturation 20.5  Medications: I have reviewed the patient's current medications.   Assessment/Plan: Mild thrombocytopenia-chronic  History of mild erythrocytosis Negative JAK2 February 2023 Mild elevation of erythropoietin level 02/12/2021 COPD CHF History of atrial fibrillation History of iron deficiency anemia and Hemoccult positive stool-2021 Low ferritin January 2024      Disposition: Mr. Miklos appears stable.  He has a history of chronic mild thrombocytopenia, likely a benign variant.  He was referred for  erythrocytosis.  The mild erythrocytosis was likely related to COPD and CHF.  I have a low clinical suspicion for a myeloproliferative disorder.  The hemoglobin has been in the high normal range over the past few months, potentially related to iron deficiency.  I suspect he has chronic blood loss related to Xarelto.  I will contact Dr. Loletha Carrow to see whether he should undergo a repeat GI evaluation.  He plans to gust discontinuing Xarelto with cardiology.  I recommend he begin a trial of oral iron supplementation.  He plans to continue clinical follow-up with Dr. Danise Mina.  I recommend he have a repeat erythropoietin level.  We will try to add this to the labs from yesterday or today  I am available to see him if he develops a progressive hematologic abnormality and as needed  Betsy Coder, MD  02/18/2022  12:04 PM

## 2022-02-18 NOTE — Progress Notes (Signed)
MD requested to add EPO level to labs today. OK to add per lab.

## 2022-02-19 ENCOUNTER — Encounter: Payer: Medicare Other | Admitting: Family Medicine

## 2022-02-19 LAB — KAPPA/LAMBDA LIGHT CHAINS
Kappa free light chain: 17.9 mg/L (ref 3.3–19.4)
Kappa, lambda light chain ratio: 1.58 (ref 0.26–1.65)
Lambda free light chains: 11.3 mg/L (ref 5.7–26.3)

## 2022-02-19 LAB — ERYTHROPOIETIN: Erythropoietin: 18 m[IU]/mL (ref 2.6–18.5)

## 2022-02-21 LAB — MULTIPLE MYELOMA PANEL, SERUM
Albumin SerPl Elph-Mcnc: 4 g/dL (ref 2.9–4.4)
Albumin/Glob SerPl: 1.6 (ref 0.7–1.7)
Alpha 1: 0.2 g/dL (ref 0.0–0.4)
Alpha2 Glob SerPl Elph-Mcnc: 0.6 g/dL (ref 0.4–1.0)
B-Globulin SerPl Elph-Mcnc: 1 g/dL (ref 0.7–1.3)
Gamma Glob SerPl Elph-Mcnc: 0.8 g/dL (ref 0.4–1.8)
Globulin, Total: 2.6 g/dL (ref 2.2–3.9)
IgA: 166 mg/dL (ref 61–437)
IgG (Immunoglobin G), Serum: 856 mg/dL (ref 603–1613)
IgM (Immunoglobulin M), Srm: 101 mg/dL (ref 15–143)
Total Protein ELP: 6.6 g/dL (ref 6.0–8.5)

## 2022-02-25 ENCOUNTER — Telehealth: Payer: Self-pay | Admitting: Family Medicine

## 2022-02-25 ENCOUNTER — Encounter: Payer: Self-pay | Admitting: Family Medicine

## 2022-02-25 ENCOUNTER — Ambulatory Visit (INDEPENDENT_AMBULATORY_CARE_PROVIDER_SITE_OTHER): Payer: Medicare Other | Admitting: Family Medicine

## 2022-02-25 VITALS — BP 118/70 | HR 59 | Temp 97.2°F | Ht 70.5 in | Wt 194.5 lb

## 2022-02-25 DIAGNOSIS — Z7901 Long term (current) use of anticoagulants: Secondary | ICD-10-CM

## 2022-02-25 DIAGNOSIS — Z0000 Encounter for general adult medical examination without abnormal findings: Secondary | ICD-10-CM | POA: Diagnosis not present

## 2022-02-25 DIAGNOSIS — D7589 Other specified diseases of blood and blood-forming organs: Secondary | ICD-10-CM

## 2022-02-25 DIAGNOSIS — H6121 Impacted cerumen, right ear: Secondary | ICD-10-CM

## 2022-02-25 DIAGNOSIS — I10 Essential (primary) hypertension: Secondary | ICD-10-CM

## 2022-02-25 DIAGNOSIS — I480 Paroxysmal atrial fibrillation: Secondary | ICD-10-CM

## 2022-02-25 DIAGNOSIS — R1319 Other dysphagia: Secondary | ICD-10-CM

## 2022-02-25 DIAGNOSIS — I700 Atherosclerosis of aorta: Secondary | ICD-10-CM

## 2022-02-25 DIAGNOSIS — K219 Gastro-esophageal reflux disease without esophagitis: Secondary | ICD-10-CM

## 2022-02-25 DIAGNOSIS — D500 Iron deficiency anemia secondary to blood loss (chronic): Secondary | ICD-10-CM

## 2022-02-25 DIAGNOSIS — Z0001 Encounter for general adult medical examination with abnormal findings: Secondary | ICD-10-CM

## 2022-02-25 DIAGNOSIS — E7800 Pure hypercholesterolemia, unspecified: Secondary | ICD-10-CM

## 2022-02-25 DIAGNOSIS — Z7189 Other specified counseling: Secondary | ICD-10-CM

## 2022-02-25 DIAGNOSIS — E663 Overweight: Secondary | ICD-10-CM

## 2022-02-25 DIAGNOSIS — D6869 Other thrombophilia: Secondary | ICD-10-CM

## 2022-02-25 DIAGNOSIS — J449 Chronic obstructive pulmonary disease, unspecified: Secondary | ICD-10-CM

## 2022-02-25 DIAGNOSIS — I428 Other cardiomyopathies: Secondary | ICD-10-CM

## 2022-02-25 DIAGNOSIS — D696 Thrombocytopenia, unspecified: Secondary | ICD-10-CM

## 2022-02-25 DIAGNOSIS — Z86711 Personal history of pulmonary embolism: Secondary | ICD-10-CM

## 2022-02-25 MED ORDER — IRON (FERROUS SULFATE) 325 (65 FE) MG PO TABS: 325.0000 mg | ORAL_TABLET | ORAL | 3 refills | Status: DC

## 2022-02-25 MED ORDER — CYANOCOBALAMIN 500 MCG PO TABS: 500.0000 ug | ORAL_TABLET | ORAL | 3 refills | Status: DC

## 2022-02-25 NOTE — Telephone Encounter (Signed)
Ok to make changes as requested by patient?

## 2022-02-25 NOTE — Progress Notes (Signed)
Patient ID: Theodore [name], male    DOB: 02/20/1934, 88 y.o.   MRN: [id number]  This visit was conducted in person.  BP 118/70   Pulse (!) 59   Temp (!) 97.2 F (36.2 C) (Temporal)   Ht 5' 10.5" (1.791 m)   Wt 194 lb 8 oz (88.2 kg)   SpO2 100%   BMI 27.51 kg/m    CC: CPE Subjective:   HPI: Theodore [name] is a 88 y.o. male presenting on 02/25/2022 for Annual Exam (MCR prt 2 [AWV- 01/21/22].)   Saw health advisor last month for medicare wellness visit. Note reviewed.   No results found.  [facility] Visit from 02/25/2022 in [facility]  PHQ-2 Total Score 0          02/25/2022    8:57 AM 01/21/2022    9:03 AM 01/15/2022    6:08 AM 01/08/2021   10:33 AM 08/09/2019    9:11 AM  Fall Risk   Falls in the past year? '1 1 1 '$ 0 0  Number falls in past yr: 0 1 0 0 0  Injury with Fall? '1 1 1 '$ 0 0  Risk for fall due to :    No Fall Risks Medication side effect  Follow up  Education provided;Falls prevention discussed;Falls evaluation completed  Falls prevention discussed Falls evaluation completed;Falls prevention discussed  Fall while playing golf over the summer, landed on tailbone.   Saw oncology Dr Benay Spice last week, note reviewed - stable mild thrombocytopenia thought benign variant. Erythrocytosis thought due to COPD and CHF. Started on oral iron, rpt Epo level was normal. Discharged from onc f/u.   Afib and CHF - on xarelto, amiodarone, entresto and farxiga.  Wants to stop xarelto - to discuss with cardiology.  Upcoming appt 03/2022.   Off oral b12 for past 6+ months.  Lab Results  Component Value Date   [id number] 229 (H) 08/16/2021   Notes worsening postural tremor noted to right hand. He is planning to see neurology 04/2022.   Dysphagia - saw ENT [place] with increased pantoprazole with benefit.   Preventative: COLONOSCOPY WITH PROPOFOL 12/02/2018 - TAx2, HP, diverticulosis [name], [name], MD)  Also had EGD 11/2018 - medium HH  [name])  Prostate cancer screening - always normal saw urology. Age out Lung cancer screening - non smoker Flu shot yearly  [place] 02/2019, 03/2019, booster 10/2019, bivalent 10/2020, 11/2021 Tetanus shot unsure  Pneumovax 2011, prevnar-13 2016  zostavax - ~2014  RSV - to consider  shingrix - 07/2019, 09/2019  Advanced directive - Advanced directives: scanned and in chart 07/2016. Wants wife Niccolas Loeper to be HCPOA then children. Grants HCPOA discretion for medical decisions  Seat belt use discussed  Sunscreen use discussed. No changing moles on skin. Sees derm regularly.  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 Interim medical history since our last visit reviewed. Allergies and medications reviewed and updated. Outpatient Medications Prior to Visit  Medication Sig Dispense Refill   acetaminophen (TYLENOL) 500 MG tablet Take 1 tablet (500 mg total) by mouth 3 (three) times daily  as needed. 30 tablet 0   amiodarone (PACERONE) 200 MG tablet Take 1 tablet (200 mg total) by mouth daily. 90 tablet 1   Budeson-Glycopyrrol-Formoterol (BREZTRI AEROSPHERE) 160-9-4.8 MCG/ACT AERO Inhale 2 puffs into the lungs 2 (two) times daily. 32.1 g 3   Cholecalciferol (VITAMIN D3) 2000 units TABS Take 2,000 Units by mouth daily.     ENTRESTO 97-103 MG TAKE 1 TABLET BY MOUTH TWICE  DAILY (DISCONTINUE VALSARTAN) 180 tablet 3   Evolocumab (REPATHA SURECLICK) 644 MG/ML SOAJ Inject 140 mg into the skin every 14 (fourteen) days. 6 mL 4   ezetimibe (ZETIA) 10 MG tablet TAKE 1 TABLET BY MOUTH DAILY 90 tablet 3   FARXIGA 10 MG TABS tablet TAKE 1 TABLET BY MOUTH DAILY   BEFORE BREAKFAST 90 tablet 3   furosemide (LASIX) 20 MG tablet Take 1 tablet (20 mg total) by mouth daily. 90 tablet 3   gabapentin (NEURONTIN) 300 MG capsule Take 900 mg by mouth every evening.     metoprolol succinate (TOPROL-XL) 100 MG 24 hr tablet Take 1 tablet (100 mg total) by mouth daily. Take with or immediately following a meal. 90 tablet 3   potassium chloride (KLOR-CON M) 10 MEQ tablet Take 1 tablet (10 mEq total) by mouth 2 (two) times daily. 180 tablet 3   rivaroxaban (XARELTO) 20 MG TABS tablet Take 1 tablet (20 mg total) by mouth daily with supper. 90 tablet 3   traZODone (DESYREL) 50 MG tablet TAKE 1/2 TO 1 TABLET BY MOUTH AT BEDTIME AS NEEDED FOR SLEEP 90 tablet 3   vitamin C (ASCORBIC ACID) 500 MG tablet Take 500 mg by mouth daily.     pantoprazole (PROTONIX) 40 MG tablet TAKE 1 TABLET BY MOUTH DAILY 90 tablet 3   amoxicillin (AMOXIL) 500 MG tablet Take 2,000 mg by mouth See admin instructions. Dental procedures (Patient not taking: Reported on 02/18/2022)     pantoprazole (PROTONIX) 40 MG tablet Take 1 tablet (40 mg total) by mouth 2 (two) times daily.     No facility-administered medications prior to visit.     Per HPI unless specifically indicated in ROS section below Review of Systems  Constitutional:  Negative for activity change, appetite change, chills, fatigue, fever and unexpected weight change.  HENT:  Negative for hearing loss.   Eyes:  Negative for visual disturbance.  Respiratory:  Negative for cough, chest tightness, shortness of breath and wheezing.   Cardiovascular:  Negative for chest pain, palpitations and leg swelling.  Gastrointestinal:  Negative for abdominal distention, abdominal pain, blood in stool, constipation, diarrhea, nausea and vomiting.  Genitourinary:  Negative for difficulty urinating and hematuria.  Musculoskeletal:  Negative for arthralgias, myalgias and neck pain.  Skin:  Negative for rash.  Neurological:  Negative for dizziness,  seizures, syncope and headaches.  Hematological:  Negative for adenopathy. Bruises/bleeds easily.  Psychiatric/Behavioral:  Negative for dysphoric mood. The patient is not nervous/anxious.     Objective:  BP 118/70   Pulse (!) 59   Temp (!) 97.2 F (36.2 C) (Temporal)   Ht 5' 10.5" (1.791 m)   Wt 194 lb 8 oz (88.2 kg)   SpO2 100%   BMI 27.51 kg/m   Wt Readings from Last 3 Encounters:  02/25/22 194 lb 8 oz (88.2 kg)  02/18/22 195 lb 9.6 oz (88.7 kg)  01/13/22 195 lb (88.5 kg)      Physical Exam Vitals and nursing note reviewed.  Constitutional:      General: He is  not in acute distress.    Appearance: Normal appearance. He is well-developed. He is not ill-appearing.  HENT:     Head: Normocephalic and atraumatic.     Right Ear: Hearing, ear canal and external ear normal.     Left Ear: Hearing, tympanic membrane, ear canal and external ear normal.     Ears:     Comments: Cerumen in R canal - attempted unsuccessful cerumen disimpaction with plastic curette    Mouth/Throat:     Comments: Wearing mask Eyes:     General: No scleral icterus.    Extraocular Movements: Extraocular movements intact.     Conjunctiva/sclera: Conjunctivae normal.     Pupils: Pupils are equal, round, and reactive to light.  Neck:     Thyroid: No thyroid mass or thyromegaly.     Vascular: No carotid bruit.  Cardiovascular:     Rate and Rhythm: Normal rate and regular rhythm.     Pulses: Normal pulses.          Radial pulses are 2+ on the right side and 2+ on the left side.     Heart sounds: Normal heart sounds. No murmur heard. Pulmonary:     Effort: Pulmonary effort is normal. No respiratory distress.     Breath sounds: Normal breath sounds. No wheezing, rhonchi or rales.  Abdominal:     General: Bowel sounds are normal. There is no distension.     Palpations: Abdomen is soft. There is no mass.     Tenderness: There is no abdominal tenderness. There is no guarding or rebound.     Hernia: No  hernia is present.  Musculoskeletal:        General: Normal range of motion.     Cervical back: Normal range of motion and neck supple.     Right lower leg: No edema.     Left lower leg: No edema.  Lymphadenopathy:     Cervical: No cervical adenopathy.  Skin:    General: Skin is warm and dry.     Findings: No rash.  Neurological:     General: No focal deficit present.     Mental Status: He is alert and oriented to person, place, and time.  Psychiatric:        Mood and Affect: Mood normal.        Behavior: Behavior normal.        Thought Content: Thought content normal.        Judgment: Judgment normal.       Results for orders placed or performed in visit on 02/18/22  Erythropoietin  Result Value Ref Range   Erythropoietin 18.0 2.6 - 18.5 mIU/mL   Lab Results  Component Value Date   CHOL 121 02/17/2022   HDL 52.50 02/17/2022   LDLCALC 50 02/17/2022   TRIG 94.0 02/17/2022   CHOLHDL 2 02/17/2022    Lab Results  Component Value Date   CREATININE 1.19 02/17/2022   BUN 19 02/17/2022   NA 140 02/17/2022   K 4.4 02/17/2022   CL 102 02/17/2022   CO2 28 02/17/2022    Lab Results  Component Value Date   WBC 5.7 02/17/2022   HGB 16.5 02/17/2022   HCT 49.4 02/17/2022   MCV 99.6 02/17/2022   PLT 133.0 (L) 02/17/2022    Lab Results  Component Value Date   TSH 2.170 05/20/2021    Lab Results  Component Value Date   IRON 98 02/17/2022   TIBC 477.4 (H) 02/17/2022  FERRITIN 15.0 (L) 02/17/2022   Lab Results  Component Value Date   VITAMINB12 934 (H) 08/16/2021    Assessment & Plan:   Problem List Items Addressed This Visit     Essential hypertension (Chronic)    Chronic, stable on current regimen - continue.       COPD mixed type (HCC) (Chronic)    Chronic, followed by pulm ([place]), stable period on breztri 2 puffs BID      Encounter for general adult medical examination with abnormal findings - Primary (Chronic)    Preventative protocols reviewed and  updated unless pt declined. Discussed healthy diet and lifestyle.       Advanced care planning/counseling discussion (Chronic)    Previously addressed.       HLD (hyperlipidemia)    Stable period on Zetia and Repatha The ASCVD Risk score (Arnett DK, et al., 2019) failed to calculate for the following reasons:   The 2019 ASCVD risk score is only valid for ages 46 to 41       Atrial fibrillation ([place])    Afib/aflutter regularly followed by cardiology on xarelto, amiodarone, and metoprolol succinate. Sounds regular today.  He desires to come off xarelto and amiodarone as he states he always knows when he goes into afib - planning to discuss this with cardiology next month.       GERD (gastroesophageal reflux disease)    Continues PPI BID.       Relevant Medications   pantoprazole (PROTONIX) 40 MG tablet   Thrombocytopenia (HCC)    Saw onc, thought benign cause. Will continue to monitor.       Overweight (BMI 25.0-29.9)    S/p weight loss on farxiga, stabilized.       Nonischemic cardiomyopathy (HCC)   Chronic anticoagulation   History of pulmonary embolism    H/o PE when xarelto was previously held for knee surgery 02/2017.       Thoracic aorta atherosclerosis ([place])    Continue repatha, zetia. Now on xarelto in place of aspirin      Iron deficiency anemia due to chronic blood loss    H/o iron deficiency anemia, anemia has since resolved but iron stores remain low. He had reassuring luminal evaluation 2020 ([facility]) and subsequent GI evaluation 03/2020 by Dr Loletha Carrow - ?small AVM blood loss but rec against further intervention at this time as long as blood counts remain stable. Consider trial oral iron - will start ferrous sulfate MWF, monitoring for constipation. Take with vitamin C.       Macrocytosis    Largely resolved. B12 levels previously high on daily replacement -did recommend starting once weekly b12.       Dysphagia    This has improved with increased PPI  through ENT.       Secondary hypercoagulable state ([place])   Cerumen impaction    Attempted disimpaction with plastic curette however ear canal nicked with resulting small bleed so attempt stopped. Advised monitor ear, let us know if worsening hearing, persistent bleeding.         Meds ordered this encounter  Medications   DISCONTD: Iron, Ferrous Sulfate, 325 (65 Fe) MG TABS    Sig: Take 325 mg by mouth every Monday, Wednesday, and Friday.    Dispense:  45 tablet    Refill:  3   DISCONTD: cyanocobalamin (VITAMIN B12) 500 MCG tablet    Sig: Take 1 tablet (500 mcg total) by mouth every other day.    Dispense:  45 tablet    Refill:  3    No orders of the defined types were placed in this encounter.   Patient Instructions  Start oral iron ferrous sulfate '325mg'$  (65 FE) every other day or MWF. Let me know if any trouble tolerating this. Take with vitamin C.  Take vitamin B12 538mg every other day.  Let me know if worsening right ear hearing/pain develops for ENT evaluation.  Good to see you today Return as needed or in 6 months for follow up visit.   Follow up plan: Return in about 6 months (around 08/26/2022) for follow up visit.  JRia Bush MD

## 2022-02-25 NOTE — Patient Instructions (Addendum)
Start oral iron ferrous sulfate '325mg'$  (65 FE) every other day or MWF. Let me know if any trouble tolerating this. Take with vitamin C.  Take vitamin B12 552mg every other day.  Let me know if worsening right ear hearing/pain develops for ENT evaluation.  Good to see you today Return as needed or in 6 months for follow up visit.

## 2022-02-25 NOTE — Telephone Encounter (Addendum)
Plz notify I've sent ferrous sulfate iron and b12 to local pharmacy. I believe pantoprazole was prescribed by ENT

## 2022-02-25 NOTE — Addendum Note (Signed)
Addended by: [name] on: 02/25/2022 05:28 PM   Modules accepted: Orders

## 2022-02-25 NOTE — Telephone Encounter (Signed)
Pt called stating he saw Dr. Darnell Level today & was prescribed the meds, pantoprazole (PROTONIX) 40 MG tablet & cyanocobalamin (VITAMIN B12) 500 MCG tablet. Pt states [facility] called him & told him how one of the meds was on back order & the other meds, the pharmacy just doesn't have. Pt states he thought Dr. Darnell Level was going to sends meds to local pharmacy not through mail order. Pt asked could meds just be sent to his preferred pharmacy [facility] on [address], [place]? Call back # [phone]

## 2022-02-25 NOTE — Assessment & Plan Note (Signed)
Preventative protocols reviewed and updated unless pt declined. Discussed healthy diet and lifestyle.  

## 2022-02-26 NOTE — Telephone Encounter (Signed)
Spoke with pt relaying Dr. G's message.  Pt expresses his thanks.  ?

## 2022-02-27 ENCOUNTER — Encounter: Payer: Self-pay | Admitting: Family Medicine

## 2022-02-27 DIAGNOSIS — H6121 Impacted cerumen, right ear: Secondary | ICD-10-CM | POA: Insufficient documentation

## 2022-02-27 DIAGNOSIS — H6120 Impacted cerumen, unspecified ear: Secondary | ICD-10-CM | POA: Insufficient documentation

## 2022-02-27 NOTE — Assessment & Plan Note (Signed)
Continues PPI BID.  

## 2022-02-27 NOTE — Assessment & Plan Note (Addendum)
Afib/aflutter regularly followed by cardiology on xarelto, amiodarone, and metoprolol succinate. Sounds regular today.  He desires to come off xarelto and amiodarone as he states he always knows when he goes into afib - planning to discuss this with cardiology next month.

## 2022-02-27 NOTE — Assessment & Plan Note (Signed)
Largely resolved. B12 levels previously high on daily replacement -did recommend starting once weekly b12.

## 2022-02-27 NOTE — Assessment & Plan Note (Signed)
Saw onc, thought benign cause. Will continue to monitor.

## 2022-02-27 NOTE — Assessment & Plan Note (Addendum)
H/o iron deficiency anemia, anemia has since resolved but iron stores remain low. He had reassuring luminal evaluation 2020 ([facility]) and subsequent GI evaluation 03/2020 by Dr Loletha Carrow - ?small AVM blood loss but rec against further intervention at this time as long as blood counts remain stable. Consider trial oral iron - will start ferrous sulfate MWF, monitoring for constipation. Take with vitamin C.

## 2022-02-27 NOTE — Assessment & Plan Note (Signed)
This has improved with increased PPI through ENT.

## 2022-02-27 NOTE — Assessment & Plan Note (Signed)
H/o PE when xarelto was previously held for knee surgery 02/2017.

## 2022-02-27 NOTE — Assessment & Plan Note (Signed)
Stable period on Zetia and Repatha The ASCVD Risk score (Arnett DK, et al., 2019) failed to calculate for the following reasons:   The 2019 ASCVD risk score is only valid for ages 65 to 40

## 2022-02-27 NOTE — Assessment & Plan Note (Signed)
Previously addressed.

## 2022-02-27 NOTE — Assessment & Plan Note (Signed)
Attempted disimpaction with plastic curette however ear canal nicked with resulting small bleed so attempt stopped. Advised monitor ear, let us know if worsening hearing, persistent bleeding.

## 2022-02-27 NOTE — Assessment & Plan Note (Signed)
Chronic, stable on current regimen - continue. 

## 2022-02-27 NOTE — Assessment & Plan Note (Addendum)
Chronic, followed by pulm Annamaria Boots), stable period on breztri 2 puffs BID

## 2022-02-27 NOTE — Assessment & Plan Note (Addendum)
S/p weight loss on farxiga, stabilized.

## 2022-02-27 NOTE — Assessment & Plan Note (Signed)
Continue repatha, zetia. Now on xarelto in place of aspirin

## 2022-03-03 ENCOUNTER — Ambulatory Visit (INDEPENDENT_AMBULATORY_CARE_PROVIDER_SITE_OTHER): Payer: Medicare Other

## 2022-03-03 DIAGNOSIS — I484 Atypical atrial flutter: Secondary | ICD-10-CM | POA: Diagnosis not present

## 2022-03-03 DIAGNOSIS — I10 Essential (primary) hypertension: Secondary | ICD-10-CM | POA: Diagnosis not present

## 2022-03-03 LAB — ECHOCARDIOGRAM COMPLETE
AR max vel: 1.05 cm2
AV Area VTI: 1.15 cm2
AV Area mean vel: 1.01 cm2
AV Mean grad: 8 mmHg
AV Peak grad: 14.9 mmHg
Ao pk vel: 1.93 m/s
Area-P 1/2: 2.69 cm2
Calc EF: 48.8 %
MV M vel: 2.53 m/s
MV Peak grad: 25.6 mmHg
P 1/2 time: 790 msec
S' Lateral: 4.08 cm
Single Plane A2C EF: 51.8 %
Single Plane A4C EF: 45.3 %

## 2022-03-04 ENCOUNTER — Encounter (HOSPITAL_BASED_OUTPATIENT_CLINIC_OR_DEPARTMENT_OTHER): Payer: Self-pay | Admitting: Cardiovascular Disease

## 2022-03-04 NOTE — Telephone Encounter (Signed)
Fyi app Friday, echo not yet reviewed by [name]

## 2022-03-06 NOTE — Progress Notes (Signed)
Office Visit    Patient Name: Theodore [name] Date of Encounter: 03/07/2022  PCP:  [name], [facility]  Cardiologist:  Skeet Latch, MD  Advanced Practice Provider:  No care team member to display Electrophysiologist:  [name], MD   Chief Complaint    Theodore [name] is a 88 y.o. male presents today for follow up after echocardiogram   Past Medical History    Past Medical History:  Diagnosis Date   Atypical atrial flutter ([place]) 12/31/2021   CAP (community acquired pneumonia) 01/19/2018   History of arthritis    History of rheumatic fever    Hypercholesterolemia    Hypertension    Nocturia    PAF (paroxysmal atrial fibrillation) ([place]) 02/2011   Placed on Pradaxa. Did not require cardioversion; spontaneously converted   Peripheral edema    Persistent atrial fibrillation ([place]) 01/21/2011   S/p ablation 12/2015 NSR up until his PCI Dec 2020 when he went into AF post PCI   Right bundle branch block    SOB (shortness of breath)    Tendonitis of elbow, left    Past Surgical History:  Procedure Laterality Date   ATRIAL FIBRILLATION ABLATION  12/19/2015   BACK SURGERY  12/24/09   fusion C3-C4   BACK SURGERY  2010   fusion L4-L5 [facility])   CARDIAC CATHETERIZATION  2009   NONOBSTRUCTIVE ATHERSCLEROTIC CORONARY DISEASE AND NORMAL  LV FUNCTION   CARDIAC CATHETERIZATION N/A 09/08/2014   Procedure: Left Heart Cath and Coronary Angiography;  Surgeon: Belva Crome, MD;  Location: [place] [facility];  Service: Cardiovascular;  Laterality: N/A;   CARDIAC CATHETERIZATION N/A 09/28/2015   Procedure: Left Heart Cath and Coronary Angiography;  Surgeon: Leonie Man, MD;  Location: [place] [facility];  Service: Cardiovascular;  Laterality: N/A;   CARDIAC CATHETERIZATION N/A 09/28/2015   Procedure: Intravascular Pressure Wire/FFR Study;  Surgeon: Leonie Man, MD;  Location: [place] [facility];  Service: Cardiovascular;   Laterality: N/A;   CARDIOVERSION N/A 10/23/2015   Procedure: CARDIOVERSION;  Surgeon: [name], MD;  Location: [facility] ENDOSCOPY;  Service: Cardiovascular;  Laterality: N/A;   CARDIOVERSION N/A 01/01/2022   Procedure: CARDIOVERSION;  Surgeon: Freada Bergeron, MD;  Location: [facility] ENDOSCOPY;  Service: Cardiovascular;  Laterality: N/A;   COLONOSCOPY  2013   per patient, rpt 5 yrs   COLONOSCOPY WITH PROPOFOL N/A 12/02/2018   TAx2, HP, diverticulosis [name], MD)   CORONARY ATHERECTOMY N/A 01/11/2019   Procedure: CORONARY ATHERECTOMY;  Surgeon: Burnell [name], MD;  Location: [place] [facility];  Service: Cardiovascular;  Laterality: N/A;   CORONARY STENT INTERVENTION N/A 01/11/2019   Procedure: CORONARY STENT INTERVENTION;  Surgeon: Burnell [name], MD;  Location: [place] [facility];  Service: Cardiovascular;  Laterality: N/A;   ELECTROPHYSIOLOGIC STUDY N/A 12/19/2015   Procedure: Atrial Fibrillation Ablation;  Surgeon: [name], MD;  Location: [place] [facility];  Service: Cardiovascular;  Laterality: N/A;   ESOPHAGOGASTRODUODENOSCOPY (EGD) WITH PROPOFOL N/A 12/02/2018   medium HH [name], MD)   INTRAVASCULAR PRESSURE WIRE/FFR STUDY N/A 01/10/2019   Procedure: INTRAVASCULAR PRESSURE WIRE/FFR STUDY;  Surgeon: Leonie Man, MD;  Location: [place] [facility];  Service: Cardiovascular;  Laterality: N/A;   KNEE ARTHROSCOPY Left 03/2016   Dr. [name]   LEFT HEART CATH AND CORONARY ANGIOGRAPHY N/A 01/10/2019   Procedure: LEFT HEART CATH AND CORONARY ANGIOGRAPHY;  Surgeon: Leonie Man, MD;  Location: [place] [facility];  Service: Cardiovascular;  Laterality: N/A;   PATELLAR TENDON REPAIR Left 2008   POLYPECTOMY  12/02/2018   Procedure: POLYPECTOMY;  Surgeon: [name], MD;  Location: [facility] ENDOSCOPY;  Service: Endoscopy;;   REPLACEMENT TOTAL KNEE Right 2006   TOTAL KNEE ARTHROPLASTY Left 02/09/2017   Procedure: LEFT TOTAL KNEE ARTHROPLASTY,  EXCISION LEFT DISTAL THIGH MASS;  Surgeon: [name], MD;  Location: [facility] ORS;  Service: Orthopedics;  Laterality: Left;  90 mins   TRICEPS TENDON REPAIR Left 2013    Allergies  Allergies  Allergen Reactions   Penicillins Anaphylaxis, Rash and Other (See Comments)    "Blistering rash" Has patient had a PCN reaction causing immediate rash, facial/tongue/throat swelling, SOB or lightheadedness with hypotension: Yes Has patient had a PCN reaction causing severe rash involving mucus membranes or skin necrosis: No Has patient had a PCN reaction that required hospitalization: No Has patient had a PCN reaction occurring within the last 10 years: No If all of the above answers are "NO", then may proceed with Cephalosporin use.    Amlodipine Swelling and Other (See Comments)    Peripheral edema   Effexor [Venlafaxine] Other (See Comments)    Tremors/shaking   Lisinopril Cough   Mevacor [Lovastatin] Other (See Comments)    Caused cataracts and elevated liver enzymes   Vicodin [Hydrocodone-Acetaminophen] Nausea And Vomiting   Zocor [Simvastatin] Other (See Comments)    LFT increase with simvastatin and lovastatin    Tamiflu [Oseltamivir Phosphate] Itching and Rash    History of Present Illness    Theodore [name] is a 88 y.o. male with a hx of CAD s/p LAD PCI, chronic diastolic heart failure, HTN, HLD, persistent atrial fibrillation s/p ablation, asthma, LLE DVT, PE, neuropathy,rheumatic fever last seen 01/13/22 by atrial fibrillation clinic.  Preivous patient of Dr. Mare Ferrari. Diagnosed with atrial fibrillation in 2014. Ablation 12/19/15 by Dr. [name]. Myoview 08/2014 for chest pain with old inferior scar with peri infarct ischemia and LVEF 39%. LHC 70% osital LAD, 50% prox LAD, 30% OM1 and 35% RCA. Recommended for medical management. Repeat LHC 09/28/15 with 70% ostial prox LAD lesion with no change in FFR. Left ventriculogram LVEF 35-40%. Improved to LVEF 50% on 03/2018. LHC 01/10/2019 and due  to progression of disease, underwent atherectomy and PCI with DES. Prior myalgia on Atorvastatin '80mg'$  but tolerates '40mg'$  and Zetia '10mg'$  daily.   Underwent L knee replacement 02/09/17. Bleeding at incision site, Xarelto changed to Aspirin. Developed LLE DVT and PE, Xarelto resumed.   Subsequent monitor 07/2019 with PAC, PVC, ventricular bigeminy but no atrial fibrillation. Echo 09/2020 LVEF 55-60%, global hypkinesis, gr2DD. Theodore [name] and [place] initiated. Echo 12/2020 LVEF 55-60%, indeterminite diastolic function, RA 63mHg. Saw pharmacy 01/2021 and started on PCSK9i.  05/2021 noted atrial fibrillation. Xarelto resumed.   Last saw Dr. ROval Linsey11/21/23 noting episodes of atrial fibrillation, fatigue. Underwent cardioversion 01/01/22. However, NSR with runs of atrial fibrillation. Amiodarone initiated. Saw Afib clinic 01/13/22. Amiodarone reduced to '200mg'$  QD due to tremor. Toprol '100mg'$  QD, Xarelto '20mg'$  QD continued.   Echo 1/22/4 newly reduced LVEF 30-35%, LV global hypokinesis, mild LVH, indeterminate diastolic parameters, RV normal, mild MR, mild ot moderate AS (mean gradient 850mg).   He presents today for follow up independently. 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 He is concerned as his heart rate drops down to the 40s by his home monitoring on pulse oximeter and BP cuff - discussed may be  falsely low related to PVCs. Notes lightheadedness with quick position changes. No near syncope, syncope. Reports he was not feeling bad until last few monthsNo chest pain. Reports right hand tremors and sensation of shakiness. Reports it is moving into his left side. No vision nor appetite changes. Endorses some fatigue. Reports no edema, orthopnea, PND. Notes occasional shortness of breath. He is sleeping 5-6 hours per night which is somewhat inhibited by his peripheral neuropathy. This is unchanged.  EKGs/Labs/Other Studies Reviewed:   The following studies were reviewed  today:  Echo 03/03/22  1. Left ventricular ejection fraction, by estimation, is 30 to 35%. Left  ventricular ejection fraction by 3D volume is 35 %. The left ventricle has  moderately decreased function. The left ventricle demonstrates global  hypokinesis. There is mild  concentric left ventricular hypertrophy. Left ventricular diastolic  parameters are indeterminate.   2. Right ventricular systolic function is normal. The right ventricular  size is normal.   3. Left atrial size was mildly dilated.   4. Right atrial size was severely dilated.   5. The mitral valve is normal in structure. Mild mitral valve  regurgitation. No evidence of mitral stenosis.   6. The aortic valve is calcified. Aortic valve regurgitation is mild to  moderate. Mild to moderate aortic valve stenosis. Aortic regurgitation PHT  measures 790 msec. Aortic valve area, by VTI measures 1.15 cm. Aortic  valve mean gradient measures 8.0  mmHg. Aortic valve Vmax measures 1.93 m/s.   7. The inferior vena cava is normal in size with greater than 50%  respiratory variability, suggesting right atrial pressure of 3 mmHg.   EKG:  EKG is ordered today. EKG performed today demonstrates NSR 63 with fist degree AV block PR 222 with PVC. Stable RBBB.  Recent Labs: 05/20/2021: TSH 2.170 02/17/2022: ALT 12; BUN 19; Creatinine, Ser 1.19; Hemoglobin 16.5; Platelets 133.0; Potassium 4.4; Sodium 140  Recent Lipid Panel    Component Value Date/Time   CHOL 121 02/17/2022 0808   CHOL 120 06/12/2021 0000   TRIG 94.0 02/17/2022 0808   HDL 52.50 02/17/2022 0808   HDL 53 06/12/2021 0000   CHOLHDL 2 02/17/2022 0808   VLDL 18.8 02/17/2022 0808   LDLCALC 50 02/17/2022 0808   LDLCALC 51 06/12/2021 0000    Risk Assessment/Calculations:   CHA2DS2-VASc Score = 5   This indicates a 7.2% annual risk of stroke. The patient's score is based upon: CHF History: 1 HTN History: 1 Diabetes History: 0 Stroke History: 0 Vascular Disease History:  1 Age Score: 2 Gender Score: 0     Home Medications   Current Meds  Medication Sig   acetaminophen (TYLENOL) 500 MG tablet Take 1 tablet (500 mg total) by mouth 3 (three) times daily as needed.   amiodarone (PACERONE) 200 MG tablet Take 1 tablet (200 mg total) by mouth daily.   amoxicillin (AMOXIL) 500 MG tablet Take 2,000 mg by mouth See admin instructions. Dental procedures   Budeson-Glycopyrrol-Formoterol (BREZTRI AEROSPHERE) 160-9-4.8 MCG/ACT AERO Inhale 2 puffs into the lungs 2 (two) times daily.   Cholecalciferol (VITAMIN D3) 2000 units TABS Take 2,000 Units by mouth daily.   cyanocobalamin (VITAMIN B12) 500 MCG tablet Take 1 tablet (500 mcg total) by mouth every other day.   ENTRESTO 97-103 MG TAKE 1 TABLET BY MOUTH TWICE  DAILY (DISCONTINUE VALSARTAN)   Evolocumab (REPATHA SURECLICK) 259 MG/ML SOAJ Inject 140 mg into the skin every 14 (fourteen) days.   ezetimibe (ZETIA) 10 MG tablet TAKE 1  TABLET BY MOUTH DAILY   FARXIGA 10 MG TABS tablet TAKE 1 TABLET BY MOUTH DAILY  BEFORE BREAKFAST   furosemide (LASIX) 20 MG tablet Take 1 tablet (20 mg total) by mouth daily.   gabapentin (NEURONTIN) 300 MG capsule Take 900 mg by mouth every evening.   Iron, Ferrous Sulfate, 325 (65 Fe) MG TABS Take 325 mg by mouth every Monday, Wednesday, and Friday.   metoprolol succinate (TOPROL-XL) 100 MG 24 hr tablet Take 1 tablet (100 mg total) by mouth daily. Take with or immediately following a meal.   pantoprazole (PROTONIX) 40 MG tablet Take 1 tablet (40 mg total) by mouth 2 (two) times daily.   rivaroxaban (XARELTO) 20 MG TABS tablet Take 1 tablet (20 mg total) by mouth daily with supper.   spironolactone (ALDACTONE) 25 MG tablet Take 0.5 tablets (12.5 mg total) by mouth daily.   traZODone (DESYREL) 50 MG tablet TAKE 1/2 TO 1 TABLET BY MOUTH AT BEDTIME AS NEEDED FOR SLEEP   vitamin C (ASCORBIC ACID) 500 MG tablet Take 500 mg by mouth daily.   [DISCONTINUED] potassium chloride (KLOR-CON M) 10 MEQ  tablet Take 1 tablet (10 mEq total) by mouth 2 (two) times daily.     Review of Systems      All other systems reviewed and are otherwise negative except as noted above.  Physical Exam    VS:  BP 128/66 (BP Location: Right Arm, Patient Position: Sitting, Cuff Size: Normal)   Pulse 63   Ht 5' 10.5" (1.791 m)   Wt 195 lb 9.6 oz (88.7 kg)   BMI 27.67 kg/m  , BMI Body mass index is 27.67 kg/m.  Wt Readings from Last 3 Encounters:  03/07/22 195 lb 9.6 oz (88.7 kg)  02/25/22 194 lb 8 oz (88.2 kg)  02/18/22 195 lb 9.6 oz (88.7 kg)    GEN: Well nourished, well developed, in no acute distress. HEENT: normal. Neck: Supple, no JVD, carotid bruits, or masses. Cardiac: Bradycardic, RRR, no murmurs, rubs, or gallops. No clubbing, cyanosis, edema.  Radials/PT 2+ and equal bilaterally.  Respiratory:  Respirations regular and unlabored, clear to auscultation bilaterally. GI: Soft, nontender, nondistended. MS: No deformity or atrophy. Skin: Warm and dry, no rash. Neuro:  Strength and sensation are intact. Psych: Normal affect.  Assessment & Plan    CAD s/p LAD PCI - No chest pain but occasional dyspnea. Ordered a stress test to further evaluate reduced ejection fraction.  GDMT Repatha, Zetia, and metoprolol. Intolerant of statin. No aspirin due to [facility]  EKG today no acute ST changes   Shared Decision Making/Informed Consent The risks [chest pain, shortness of breath, cardiac arrhythmias, dizziness, blood pressure fluctuations, myocardial infarction, stroke/transient ischemic attack, nausea, vomiting, allergic reaction, radiation exposure, metallic taste sensation and life-threatening complications (estimated to be 1 in 10,000)], benefits (risk stratification, diagnosing coronary artery disease, treatment guidance) and alternatives of a nuclear stress test were discussed in detail with Mr. Danner and he agrees to proceed.   Chronic systolic and diastolic heart failure - Euvolemic, newly reduced  EF 30-35%. Consider etiology atrial fib vs new ischemia. Plan for stress test as above. If no ischemia by myoview, repeat echo in 3 months. GDMT Lisabeth Register, Metoprolol, Lasix. Start Spironolactone 12.'5mg'$  once daily. BMET in a week.  HTN - BP well controlled. Continue current antihypertensive regimen.    HLD - LDL goal <70. Continue Zetia, Repatha. 02/17/22 LDL 50  Persistent atrial fibrillation s/p ablation / hypercoagulable state / on amiodarone  therapy - Normal sinus rhythm 1st degree heart block PR 222 with PVC and stable RBBB. Associates tremors with amiodarone despite reduced dose. He has upcoming appt with EP next week  Amiodarone monitoring: 02/17/22 Normal liver enzymes and thyroid panel in one week. CHA2DS2-VASc Score = 5 [CHF History: 1, HTN History: 1, Diabetes History: 0, Stroke History: 0, Vascular Disease History: 1, Age Score: 2, Gender Score: 0].  Therefore, the patient's annual risk of stroke is 7.2 %.   Continues Zarelto   Anemia - Follows with Dr. Benay Spice. Possibly related to long term Xarelto use. Initiated discussion regarding Watchman and gave him materials to review.          Disposition: Follow up in 2 month(s) with Skeet Latch, MD or APP.  Signed, Loel Dubonnet, NP 03/07/2022, 9:38 AM [place]

## 2022-03-07 ENCOUNTER — Ambulatory Visit (INDEPENDENT_AMBULATORY_CARE_PROVIDER_SITE_OTHER): Payer: Medicare Other | Admitting: Family

## 2022-03-07 ENCOUNTER — Encounter (HOSPITAL_BASED_OUTPATIENT_CLINIC_OR_DEPARTMENT_OTHER): Payer: Self-pay | Admitting: Family

## 2022-03-07 ENCOUNTER — Encounter (HOSPITAL_BASED_OUTPATIENT_CLINIC_OR_DEPARTMENT_OTHER): Payer: Self-pay | Admitting: Cardiovascular Disease

## 2022-03-07 VITALS — BP 128/66 | HR 63 | Ht 70.5 in | Wt 195.6 lb

## 2022-03-07 DIAGNOSIS — I10 Essential (primary) hypertension: Secondary | ICD-10-CM | POA: Diagnosis not present

## 2022-03-07 DIAGNOSIS — Z9861 Coronary angioplasty status: Secondary | ICD-10-CM | POA: Diagnosis not present

## 2022-03-07 DIAGNOSIS — I5042 Chronic combined systolic (congestive) and diastolic (congestive) heart failure: Secondary | ICD-10-CM

## 2022-03-07 DIAGNOSIS — E785 Hyperlipidemia, unspecified: Secondary | ICD-10-CM

## 2022-03-07 DIAGNOSIS — D500 Iron deficiency anemia secondary to blood loss (chronic): Secondary | ICD-10-CM

## 2022-03-07 DIAGNOSIS — I2510 Atherosclerotic heart disease of native coronary artery without angina pectoris: Secondary | ICD-10-CM

## 2022-03-07 MED ORDER — SPIRONOLACTONE 25 MG PO TABS: 12.5000 mg | ORAL_TABLET | Freq: Every day | ORAL | 3 refills | Status: DC

## 2022-03-07 MED ORDER — POTASSIUM CHLORIDE CRYS ER 10 MEQ PO TBCR: 10.0000 meq | EXTENDED_RELEASE_TABLET | Freq: Every day | ORAL | 3 refills | Status: DC

## 2022-03-07 NOTE — Patient Instructions (Addendum)
Medication Instructions:  Your physician has recommended you make the following change in your medication:   START Spironolactone half tablet daily  Reduce Potassium to once daily   *If you need a refill on your cardiac medications before your next appointment, please call your pharmacy*   Lab Work: Your physician recommends that you return for lab work in 1 week for BMP, thyroid panel  Please return for Lab work. You may come to the...   [facility] (3rd floor) [address] [place], [place] [address]  Open: 8am-Noon and 1pm-4:30pm  Please ring the doorbell on the small table when you exit the elevator and the Lab Tech will come get you  [place] at [facility] [address] [place], [place], [place] [address] Open: 8am-1pm, then 2pm-4:30pm   [place]- Please see attached locations sheet stapled to your lab work with address and hours.    If you have labs (blood work) drawn today and your tests are completely normal, you will receive your results only by: [facility] (if you have MyChart) OR A paper copy in the mail If you have any lab test that is abnormal or we need to change your treatment, we will call you to review the results.   Testing/Procedures: Your Physician has requested you have a Myocardial Perfusion Imaging Study.    Please arrive 15 minutes prior to your appointment time for registration and insurance purposes.   The test will take approximately 3 to 4 hours to complete; you may bring reading material. If someone comes with you to your appointment, they will need to remain in the main lobby due to limited testing space in the testing area. **If you are pregnant or breastfeeding, please notify the nuclear lab prior to your appointment**   How to prepare for your test:  - Do not eat or drink 3 hours prior to your test, except you may have water  - Do not consume products containing caffeine ( regular or decaf) 12  hours prior to your test ( coffee, chocolate, sodas or teas)  - Do bring a list of your current medications with you. If not listed below, you may take your medications as normal (Hold beta blocker- 24 hour prior for exercise myoview)   - Do wear comfortable clothes (no dresses or overalls) and walking shoes ( tennis shoes preferred), no heel or open toe shoes are allowed - Do not wear cologne, perfume, aftershave, or lotions ( deodorant is allowed)  - If these instructions are not followed, your test will be rescheduled   If you cannot keep your appointment, please provide 24 hours notice to the Nuclear Lab, to avoid a possible $50 charge to your account!     Follow-Up: At [facility], you and your health needs are our priority.  As part of our continuing mission to provide you with exceptional heart care, we have created designated Provider Care Teams.  These Care Teams include your primary Cardiologist (physician) and Advanced Practice Providers (APPs -  Physician Assistants and Nurse Practitioners) who all work together to provide you with the care you need, when you need it.  We recommend signing up for the patient portal called "MyChart".  Sign up information is provided on this After Visit Summary.  MyChart is used to connect with patients for Virtual Visits (Telemedicine).  Patients are able to view lab/test results, encounter notes, upcoming appointments, etc.  Non-urgent messages can be sent to your provider as  well.   To learn more about what you can do with MyChart, go to [email or web address].    Your next appointment:   2-3 month(s)  Provider:   Skeet Latch, MD or Laurann Montana, NP    Other Instructions  Your reduced ejection fraction is likely related to your atrial fibrillation. It could also be related to a problem with your stent. We are evaluating for this with a stress test. If your stress test is normal, we will do another echocardiogram in 3 months to  reassess your ejection fraction. If your stress test is abnormal, we may think about a cardiac catheterization to take a closer look at the stent.   We may consider Watchman  device to help prevent blood clots without needing Xarelto.

## 2022-03-10 ENCOUNTER — Telehealth (HOSPITAL_COMMUNITY): Payer: Self-pay

## 2022-03-10 NOTE — Telephone Encounter (Signed)
Spoke with the patient, detailed instructions given. He stated that he would be here for his test. Asked to call back with any questions. S.Levone Otten EMTP/CCT 

## 2022-03-11 ENCOUNTER — Encounter (HOSPITAL_BASED_OUTPATIENT_CLINIC_OR_DEPARTMENT_OTHER): Payer: Self-pay

## 2022-03-11 ENCOUNTER — Telehealth (HOSPITAL_BASED_OUTPATIENT_CLINIC_OR_DEPARTMENT_OTHER): Payer: Self-pay

## 2022-03-11 ENCOUNTER — Ambulatory Visit (HOSPITAL_COMMUNITY): Payer: Medicare Other | Attending: Cardiology

## 2022-03-11 DIAGNOSIS — I5042 Chronic combined systolic (congestive) and diastolic (congestive) heart failure: Secondary | ICD-10-CM

## 2022-03-11 DIAGNOSIS — I2510 Atherosclerotic heart disease of native coronary artery without angina pectoris: Secondary | ICD-10-CM

## 2022-03-11 DIAGNOSIS — Z9861 Coronary angioplasty status: Secondary | ICD-10-CM | POA: Diagnosis present

## 2022-03-11 DIAGNOSIS — E785 Hyperlipidemia, unspecified: Secondary | ICD-10-CM | POA: Diagnosis present

## 2022-03-11 LAB — MYOCARDIAL PERFUSION IMAGING
LV dias vol: 131 mL (ref 62–150)
LV sys vol: 75 mL
Nuc Stress EF: 43 %
Peak HR: 71 {beats}/min
Rest HR: 53 {beats}/min
Rest Nuclear Isotope Dose: 10.9 mCi
SDS: 4
SRS: 0
SSS: 4
ST Depression (mm): 0 mm
Stress Nuclear Isotope Dose: 32.2 mCi
TID: 1.1

## 2022-03-11 MED ORDER — REGADENOSON 0.4 MG/5ML IV SOLN
0.4000 mg | Freq: Once | INTRAVENOUS | Status: AC
  Administered 2022-03-11: 0.4 mg via INTRAVENOUS

## 2022-03-11 MED ORDER — TECHNETIUM TC 99M TETROFOSMIN IV KIT
10.9000 | PACK | Freq: Once | INTRAVENOUS | Status: AC | PRN
  Administered 2022-03-11: 10.9 via INTRAVENOUS

## 2022-03-11 MED ORDER — TECHNETIUM TC 99M TETROFOSMIN IV KIT
32.2000 | PACK | Freq: Once | INTRAVENOUS | Status: AC | PRN
  Administered 2022-03-11: 32.2 via INTRAVENOUS

## 2022-03-11 NOTE — Telephone Encounter (Addendum)
Seen by patient Theodore [name] on 03/11/2022  4:45 PM, repeat testing ordered and follow up mychart message sent to patient.   ----- Message from Loel Dubonnet, NP sent at 03/11/2022  4:42 PM EST ----- Stress test reveals no ischemia (no evidence of blockage) which is good.  It did note reduced ejection fraction of 43%. This is similar to echocardiogram which showed LVEF of 30-35%. The echocardiogram is likely the more accurate measurement of heart muscle function.   Recommend repeat echocardiogram in 3 months to reassess LVEF.

## 2022-03-11 NOTE — Progress Notes (Unsigned)
Cardiology Office Note Date:  03/11/2022  Patient ID:  Theodore [name], Theodore [name] 04/30/1934, MRN [id number] PCP:  [name], MD  Cardiologist:  Dr. [name] Electrophysiologist: Dr. [name]     Chief Complaint:  ***  6 mo  History of Present Illness: Theodore [name] is a 88 y.o. male with history of CAD (PCI to LAD Dec 2020), HTN, HLD, AFib, neuropathy, chronic back problems, RBBB, NICM, with recovered LVEFchronic CHF (systolic).  He comes in today to be seen for Dr. [name], last seen by him Sept 2022, at that time, reported intermittent low BPs at home after a recent increase in his BB dose and was reduced.  He was no longer on [place] with no AF by symptoms post ablation  More recently saw A. Tillery, PA-C 05/20/21 with symptoms of his Afib for about 10 days, he was in rate controlled Afib, resumed on Xarelto, planned for f/u visit and DCCV if needed after uninterrupted a/c., perhaps amiodarone start  I saw him 06/12/21 He had spontaneous conversion back to SR after about 2-3 weeks. He thinks may have been a little over-diuresed and when (after d/w Dr. [name]) reduce the lasix, he went back in SR. No bleeding or signs of bleeding, doesn't love being on xarelto, will bleed more with nicks/cuts. No CP Active, golfs, busy around the house, his wife's health is poor and he helps her. No exertional incapacities. No CP, SOB No near syncope or syncope. Planned to monitor burden without AAD  At his visit with Dr. [name] 10/29/21, no recurrent episodes, maintained on his [place]  At his visit with Dr. [name] 12/31/21 was in AFlutter, symptomatic with fatigue, poor exertional capacity, planned for DCCV  DCCV 01/01/22, successful though quickly in/out of afib post DCCV and started on amiodarone  AFib clinic 01/13/22 c/o tremors, though maintaining SR, dose reduced to '200mg'$  daily  At his cards visit 03/07/22, reported observed HRs to the 40's by his pulse ox and BP cuff, c/w some tremor ,  lightheaded with quick changes in position.   *** amio dose to QOD? *** monitor for rate/block, has trifasicular block....   *** xarelto, bleeding, dose, labs   AFib/AAD hx Diagnosed Dec 2012 PVI ablation 12/19/2015 Amiodarone started Nov 2023  Past Medical History:  Diagnosis Date   Atypical atrial flutter ([place]) 12/31/2021   CAP (community acquired pneumonia) 01/19/2018   History of arthritis    History of rheumatic fever    Hypercholesterolemia    Hypertension    Nocturia    PAF (paroxysmal atrial fibrillation) ([place]) 02/2011   Placed on Pradaxa. Did not require cardioversion; spontaneously converted   Peripheral edema    Persistent atrial fibrillation ([place]) 01/21/2011   S/p ablation 12/2015 NSR up until his PCI Dec 2020 when he went into AF post PCI   Right bundle branch block    SOB (shortness of breath)    Tendonitis of elbow, left     Past Surgical History:  Procedure Laterality Date   ATRIAL FIBRILLATION ABLATION  12/19/2015   BACK SURGERY  12/24/09   fusion C3-C4   BACK SURGERY  2010   fusion L4-L5 [facility] [place])   CARDIAC CATHETERIZATION  2009   NONOBSTRUCTIVE ATHERSCLEROTIC CORONARY DISEASE AND NORMAL  LV FUNCTION   CARDIAC CATHETERIZATION N/A 09/08/2014   Procedure: Left Heart Cath and Coronary Angiography;  Surgeon: Belva Crome, MD;  Location: [place] [facility];  Service: Cardiovascular;  Laterality: N/A;   CARDIAC CATHETERIZATION N/A 09/28/2015   Procedure:  Left Heart Cath and Coronary Angiography;  Surgeon: Leonie Man, MD;  Location: [place] [facility];  Service: Cardiovascular;  Laterality: N/A;   CARDIAC CATHETERIZATION N/A 09/28/2015   Procedure: Intravascular Pressure Wire/FFR Study;  Surgeon: Leonie Man, MD;  Location: [place] [facility];  Service: Cardiovascular;  Laterality: N/A;   CARDIOVERSION N/A 10/23/2015   Procedure: CARDIOVERSION;  Surgeon: [name], MD;  Location[facility] ENDOSCOPY;  Service: Cardiovascular;  Laterality: N/A;    CARDIOVERSION N/A 01/01/2022   Procedure: CARDIOVERSION;  Surgeon: Freada Bergeron, MD;  Location: [facility]COPY;  Service: Cardiovascular;  Laterality: N/A;   COLONOSCOPY  2013   per patient, rpt 5 yrs   COLONOSCOPY WITH PROPOFOL N/A 12/02/2018   TAx2, HP, diverticulosis [name], MD)   CORONARY ATHERECTOMY N/A 01/11/2019   Procedure: CORONARY ATHERECTOMY;  Surgeon: Burnell [name], MD;  Location: [place] [facility] LAB;  Service: Cardiovascular;  Laterality: N/A;   CORONARY STENT INTERVENTION N/A 01/11/2019   Procedure: CORONARY STENT INTERVENTION;  Surgeon: Burnell [name], MD;  Location: [place] [facility];  Service: Cardiovascular;  Laterality: N/A;   ELECTROPHYSIOLOGIC STUDY N/A 12/19/2015   Procedure: Atrial Fibrillation Ablation;  Surgeon: [name], MD;  Location: [place] [facility];  Service: Cardiovascular;  Laterality: N/A;   ESOPHAGOGASTRODUODENOSCOPY (EGD) WITH PROPOFOL N/A 12/02/2018   medium HH [name], MD)   INTRAVASCULAR PRESSURE WIRE/FFR STUDY N/A 01/10/2019   Procedure: INTRAVASCULAR PRESSURE WIRE/FFR STUDY;  Surgeon: Leonie Man, MD;  Location: [place] [facility];  Service: Cardiovascular;  Laterality: N/A;   KNEE ARTHROSCOPY Left 03/2016   Dr. [name]   LEFT HEART CATH AND CORONARY ANGIOGRAPHY N/A 01/10/2019   Procedure: LEFT HEART CATH AND CORONARY ANGIOGRAPHY;  Surgeon: Leonie Man, MD;  Location: [place] [facility];  Service: Cardiovascular;  Laterality: N/A;   PATELLAR TENDON REPAIR Left 2008   POLYPECTOMY  12/02/2018   Procedure: POLYPECTOMY;  Surgeon: [name], MD;  Location: [facility] ENDOSCOPY;  Service: Endoscopy;;   REPLACEMENT TOTAL KNEE Right 2006   TOTAL KNEE ARTHROPLASTY Left 02/09/2017   Procedure: LEFT TOTAL KNEE ARTHROPLASTY, EXCISION LEFT DISTAL THIGH MASS;  Surgeon: [name], MD;  Location: [facility] ORS;  Service: Orthopedics;  Laterality: Left;  90 mins   TRICEPS TENDON REPAIR Left 2013    Current  Outpatient Medications  Medication Sig Dispense Refill   acetaminophen (TYLENOL) 500 MG tablet Take 1 tablet (500 mg total) by mouth 3 (three) times daily as needed. 30 tablet 0   amiodarone (PACERONE) 200 MG tablet Take 1 tablet (200 mg total) by mouth daily. 90 tablet 1   amoxicillin (AMOXIL) 500 MG tablet Take 2,000 mg by mouth See admin instructions. Dental procedures     Budeson-Glycopyrrol-Formoterol (BREZTRI AEROSPHERE) 160-9-4.8 MCG/ACT AERO Inhale 2 puffs into the lungs 2 (two) times daily. 32.1 g 3   Cholecalciferol (VITAMIN D3) 2000 units TABS Take 2,000 Units by mouth daily.     cyanocobalamin (VITAMIN B12) 500 MCG tablet Take 1 tablet (500 mcg total) by mouth every other day. 45 tablet 3   ENTRESTO 97-103 MG TAKE 1 TABLET BY MOUTH TWICE  DAILY (DISCONTINUE VALSARTAN) 180 tablet 3   Evolocumab (REPATHA SURECLICK) 681 MG/ML SOAJ Inject 140 mg into the skin every 14 (fourteen) days. 6 mL 4   ezetimibe (ZETIA) 10 MG tablet TAKE 1 TABLET BY MOUTH DAILY 90 tablet 3   FARXIGA 10 MG TABS tablet TAKE 1 TABLET BY MOUTH DAILY  BEFORE  BREAKFAST 90 tablet 3   furosemide (LASIX) 20 MG tablet Take 1 tablet (20 mg total) by mouth daily. 90 tablet 3   gabapentin (NEURONTIN) 300 MG capsule Take 900 mg by mouth every evening.     Iron, Ferrous Sulfate, 325 (65 Fe) MG TABS Take 325 mg by mouth every Monday, Wednesday, and Friday. 45 tablet 3   metoprolol succinate (TOPROL-XL) 100 MG 24 hr tablet Take 1 tablet (100 mg total) by mouth daily. Take with or immediately following a meal. 90 tablet 3   pantoprazole (PROTONIX) 40 MG tablet Take 1 tablet (40 mg total) by mouth 2 (two) times daily.     potassium chloride (KLOR-CON M) 10 MEQ tablet Take 1 tablet (10 mEq total) by mouth daily. 90 tablet 3   rivaroxaban (XARELTO) 20 MG TABS tablet Take 1 tablet (20 mg total) by mouth daily with supper. 90 tablet 3   spironolactone (ALDACTONE) 25 MG tablet Take 0.5 tablets (12.5 mg total) by mouth daily. 45 tablet 3    traZODone (DESYREL) 50 MG tablet TAKE 1/2 TO 1 TABLET BY MOUTH AT BEDTIME AS NEEDED FOR SLEEP 90 tablet 3   vitamin C (ASCORBIC ACID) 500 MG tablet Take 500 mg by mouth daily.     No current facility-administered medications for this visit.    Allergies:   Penicillins, Amlodipine, Effexor [venlafaxine], Lisinopril, Mevacor [lovastatin], Vicodin [hydrocodone-acetaminophen], Zocor [simvastatin], and Tamiflu [oseltamivir phosphate]   Social History:  The patient  reports that he has never smoked. He has never used smokeless tobacco. He reports current alcohol use of about 14.0 standard drinks of alcohol per week. He reports that he does not use drugs.   Family History:  The patient's family history includes Asthma in his sister; COPD in his brother and father; Congestive Heart Failure (age of onset: 47) in his father; Heart disease in his brother and sister; Hypertension in his father.  ROS:  Please see the history of present illness.    All other systems are reviewed and otherwise negative.   PHYSICAL EXAM:  VS:  There were no vitals taken for this visit. BMI: There is no height or weight on file to calculate BMI. Well nourished, well developed, in no acute distress HEENT: normocephalic, atraumatic Neck: no JVD, carotid bruits or masses Cardiac: *** RRR; no significant murmurs, no rubs, or gallops Lungs: *** CTA b/l, no wheezing, rhonchi or rales Abd: soft, nontender MS: no deformity or atrophy Ext: *** no edema Skin: warm and dry, no rash Neuro:  No gross deficits appreciated Psych: euthymic mood, full affect    EKG:  Done today and reviewed by myself shows  ***   12/24/20: TTE  1. Left ventricular ejection fraction, by estimation, is 55 to 60%. Left  ventricular ejection fraction by 3D volume is 60 %. The left ventricle has  normal function. The left ventricle has no regional wall motion  abnormalities. Left ventricular diastolic   function could not be evaluated.   2.  Right ventricular systolic function is normal. The right ventricular  size is normal. There is normal pulmonary artery systolic pressure. The  estimated right ventricular systolic pressure is 74.2 mmHg.   3. Left atrial size was moderately dilated.   4. Right atrial size was moderately dilated.   5. The mitral valve was not well visualized. No evidence of mitral valve  regurgitation.   6. The aortic valve is tricuspid. Aortic valve regurgitation is mild.  Aortic valve sclerosis/calcification is present, without any  evidence of  aortic stenosis.   7. Aortic dilatation noted. There is borderline dilatation of the aortic  root, measuring 38 mm.   8. The inferior vena cava is normal in size with greater than 50%  respiratory variability, suggesting right atrial pressure of 3 mmHg.   01/11/2019: LHC/PCI Prox LAD to Mid LAD lesion is 80% stenosed. A drug-eluting stent was successfully placed using a STENT RESOLUTE ONYX 3.5X18. Post intervention, there is a 0% residual stenosis. Mid LAD lesion is 70% stenosed. A drug-eluting stent was successfully placed using a STENT RESOLUTE ONYX 2.75X26. Post intervention, there is a 0% residual stenosis.   1. Severe, heavily calcified stenoses in the proximal and mid LAD 2. Successful orbital atherectomy of the proximal and mid LAD 3. Successful placement of 2 drug eluting stents in the proximal and mid LAD (overlapping)   Recommendations: Continue ASA and Plavix for at least six months.    12/19/2015: EPS/ablation CONCLUSIONS: 1. Sinus rhythm upon presentation.   2. Successful electrical isolation and anatomical encircling of all four pulmonary veins with radiofrequency current.    3. Cavo-tricuspid isthmus ablation was performed with complete bidirectional isthmus block achieved.  4. No inducible arrhythmias following ablation both on and off of  dobutamine 5. No early apparent complications.   Recent Labs: 05/20/2021: TSH 2.170 02/17/2022: ALT 12;  BUN 19; Creatinine, Ser 1.19; Hemoglobin 16.5; Platelets 133.0; Potassium 4.4; Sodium 140  02/17/2022: Cholesterol 121; HDL 52.50; LDL Cholesterol 50; Total CHOL/HDL Ratio 2; Triglycerides 94.0; VLDL 18.8   CrCl cannot be calculated (Patient's most recent lab result is older than the maximum 21 days allowed.).   Wt Readings from Last 3 Encounters:  03/11/22 195 lb (88.5 kg)  03/07/22 195 lb 9.6 oz (88.7 kg)  02/25/22 194 lb 8 oz (88.2 kg)     Other studies reviewed: Additional studies/records reviewed today include: summarized above  ASSESSMENT AND PLAN:  Persistent AFib CHA2DS2Vasc is 5, on Xarelto, *** appropriately dosed amiodarone ***   CAD *** No symptoms of angina *** On [place]atha, BB, ASA C/w Dr. [name]   HTN *** Looks ok  NICM *** Recovered LVEF *** On Entresto, BB, lasix/K+ *** No symptoms or exam findings of volume OL C/w Dr. [name]  5. SB, baseline conduction system disease *** No symptoms of brady   Disposition: ***  Current medicines are reviewed at length with the patient today.  The patient did not have any concerns regarding medicines.  Venetia Night, PA-C 03/11/2022 10:12 AM     [address] [place] [address] [phone] (office)  [phone] (fax)

## 2022-03-11 NOTE — Telephone Encounter (Signed)
Please advise 

## 2022-03-12 ENCOUNTER — Telehealth (HOSPITAL_BASED_OUTPATIENT_CLINIC_OR_DEPARTMENT_OTHER): Payer: Self-pay | Admitting: Family

## 2022-03-12 NOTE — Telephone Encounter (Signed)
Called to speak with patient to schedule the 3 month  follow up Echocardiogram---pt states it is hard for him to talk now and requested I call back

## 2022-03-13 ENCOUNTER — Ambulatory Visit: Payer: Medicare Other | Attending: Physician Assistant

## 2022-03-13 ENCOUNTER — Encounter: Payer: Self-pay | Admitting: Physician Assistant

## 2022-03-13 ENCOUNTER — Ambulatory Visit: Payer: Medicare Other | Attending: Physician Assistant | Admitting: Physician Assistant

## 2022-03-13 VITALS — BP 118/66 | HR 56 | Ht 70.5 in | Wt 193.8 lb

## 2022-03-13 DIAGNOSIS — I4819 Other persistent atrial fibrillation: Secondary | ICD-10-CM | POA: Diagnosis not present

## 2022-03-13 DIAGNOSIS — I10 Essential (primary) hypertension: Secondary | ICD-10-CM

## 2022-03-13 DIAGNOSIS — I428 Other cardiomyopathies: Secondary | ICD-10-CM | POA: Diagnosis not present

## 2022-03-13 DIAGNOSIS — I480 Paroxysmal atrial fibrillation: Secondary | ICD-10-CM

## 2022-03-13 DIAGNOSIS — I2510 Atherosclerotic heart disease of native coronary artery without angina pectoris: Secondary | ICD-10-CM

## 2022-03-13 DIAGNOSIS — I493 Ventricular premature depolarization: Secondary | ICD-10-CM

## 2022-03-13 MED ORDER — FUROSEMIDE 20 MG PO TABS: 20.0000 mg | ORAL_TABLET | Freq: Every day | ORAL | 3 refills | Status: DC

## 2022-03-13 MED ORDER — AMIODARONE HCL 200 MG PO TABS: 200.0000 mg | ORAL_TABLET | ORAL | 1 refills | Status: DC

## 2022-03-13 NOTE — Patient Instructions (Signed)
Medication Instructions:    START TAKING: AMIODARONE 200 MG EVERY OTHER DAY   *If you need a refill on your cardiac medications before your next appointment, please call your pharmacy*   Lab Work:  NONE ORDERED  TODAY   If you have labs (blood work) drawn today and your tests are completely normal, you will receive your results only by: [place] (if you have MyChart) OR A paper copy in the mail If you have any lab test that is abnormal or we need to change your treatment, we will call you to review the results.   Testing/Procedures:  Your physician has recommended that you wear an event monitor. Event monitors are medical devices that record the heart's electrical activity. Doctors most often [place] these monitors to diagnose arrhythmias. Arrhythmias are problems with the speed or rhythm of the heartbeat. The monitor is a small, portable device. You can wear one while you do your normal daily activities. This is usually used to diagnose what is causing palpitations/syncope (passing out).    Follow-Up: At [facility], you and your health needs are our priority.  As part of our continuing mission to provide you with exceptional heart care, we have created designated Provider Care Teams.  These Care Teams include your primary Cardiologist (physician) and Advanced Practice Providers (APPs -  Physician Assistants and Nurse Practitioners) who all work together to provide you with the care you need, when you need it.  We recommend signing up for the patient portal called "MyChart".  Sign up information is provided on this After Visit Summary.  MyChart is used to connect with patients for Virtual Visits (Telemedicine).  Patients are able to view lab/test results, encounter notes, upcoming appointments, etc.  Non-urgent messages can be sent to your provider as well.   To learn more about what you can do with MyChart, go to [email or web address].    Your next appointment:   3  month(s)  Provider:   You may see [name], MD or one of the following Advanced Practice Providers on your designated Care Team:   Tommye Standard, [place] Legrand Como "Jonni Sanger" Max, [place] Mamie Levers, NP  Other Instructions  [place] Monitor Instructions  Your physician has requested you wear a ZIO patch monitor for  3 days.  This is a single patch monitor. [employer] supplies one patch monitor per enrollment. Additional stickers are not available. Please do not apply patch if you will be having a Nuclear Stress Test,  Echocardiogram, Cardiac CT, MRI, or Chest Xray during the period you would be wearing the  monitor. The patch cannot be worn during these tests. You cannot remove and re-apply the  ZIO XT patch monitor.  Your ZIO patch monitor will be mailed 3 day USPS to your address on file. It may take 3-5 days  to receive your monitor after you have been enrolled.  Once you have received your monitor, please review the enclosed instructions. Your monitor  has already been registered assigning a specific monitor serial # to you.  Billing and Patient Assistance Program Information  We have supplied [employer] with any of your insurance information on file for billing purposes. [employer] offers a sliding scale Patient Assistance Program for patients that do not have  insurance, or whose insurance does not completely cover the cost of the ZIO monitor.  You must apply for the Patient Assistance Program to qualify for this discounted rate.  To apply, please call [employer] at [phone], select option  4, select option 2, ask to apply for  Patient Assistance Program. Theodore [name] will ask your household income, and how many people  are in your household. They will quote your out-of-pocket cost based on that information.  [employer] will also be able to set up a 28-month interest-free payment plan if needed.  Applying the monitor   Shave hair from upper left chest.  Hold abrader disc by  orange tab. Rub abrader in 40 strokes over the upper left chest as  indicated in your monitor instructions.  Clean area with 4 enclosed alcohol pads. Let dry.  Apply patch as indicated in monitor instructions. Patch will be placed under collarbone on left  side of chest with arrow pointing upward.  Rub patch adhesive wings for 2 minutes. Remove white label marked "1". Remove the white  label marked "2". Rub patch adhesive wings for 2 additional minutes.  While looking in a mirror, press and release button in center of patch. A small green light will  flash 3-4 times. This will be your only indicator that the monitor has been turned on.  Do not shower for the first 24 hours. You may shower after the first 24 hours.  Press the button if you feel a symptom. You will hear a small click. Record Date, Time and  Symptom in the Patient Logbook.  When you are ready to remove the patch, follow instructions on the last 2 pages of Patient  Logbook. Stick patch monitor onto the last page of Patient Logbook.  Place Patient Logbook in the blue and white box. Use locking tab on box and tape box closed  securely. The blue and white box has prepaid postage on it. Please place it in the mailbox as  soon as possible. Your physician should have your test results approximately 7 days after the  monitor has been mailed [facility] to [facility]  Call [facility]at [phone] you have questions regarding  your ZIO XT patch monitor. Call them immediately if you see an orange light blinking on your  monitor.  If your monitor falls off in less than 4 days, contact our Monitor department at [phone]  If your monitor becomes loose or falls off after 4 days call [employer] at [phone]  suggestions on securing your monitor

## 2022-03-13 NOTE — Progress Notes (Unsigned)
Enrolled for [employer] to mail a ZIO XT long term holter monitor to the patients address on file.   Dr. [name] to read.

## 2022-03-15 LAB — THYROID PANEL WITH TSH
Free Thyroxine Index: 2.6 (ref 1.2–4.9)
T3 Uptake Ratio: 31 % (ref 24–39)
T4, Total: 8.4 ug/dL (ref 4.5–12.0)
TSH: 2.67 u[IU]/mL (ref 0.450–4.500)

## 2022-03-15 LAB — BASIC METABOLIC PANEL
BUN/Creatinine Ratio: 18 (ref 10–24)
BUN: 19 mg/dL (ref 8–27)
CO2: 24 mmol/L (ref 20–29)
Calcium: 9.4 mg/dL (ref 8.6–10.2)
Chloride: 102 mmol/L (ref 96–106)
Creatinine, Ser: 1.05 mg/dL (ref 0.76–1.27)
Glucose: 85 mg/dL (ref 70–99)
Potassium: 4.4 mmol/L (ref 3.5–5.2)
Sodium: 142 mmol/L (ref 134–144)
eGFR: 70 mL/min/{1.73_m2} (ref >59)

## 2022-03-16 DIAGNOSIS — I480 Paroxysmal atrial fibrillation: Secondary | ICD-10-CM | POA: Diagnosis not present

## 2022-03-19 NOTE — Progress Notes (Signed)
Subjective:    Patient ID: Theodore [name], male    DOB: 19-Oct-1934, 88 y.o.   MRN: [id number] HPI male never smoker followed for COPD/bronchitis, complicated by HBP, peripheral edema, GERD, A. Fib/ ablation, CAD, PE/DVT,  PFT 2011 FEV1/FVC 0.65.    PFT 11/30/13- moderate obstruction, insignificant response to bronchodilator, airtrapping, minimal diffusion defect. FEV1 2.21/ 70%, FEV1/FVC 0.63, TLC 97%, DLCO 78%. EKG 03/19/2018-sinus bradycardia with frequent multifocal PVCs/ventricular trigeminy PFT 10/15/2018- Moderate obstruction with response to BD, Airtrapping, Normal Diffusion (Chronic bronchitis pattern) -----------------------------------------------------------------------------------------    12/20/21-   88 year old male never smoker followed for COPD/bronchitis, complicated by HBP, peripheral edema, GERD, A. Fib/ Xarelto, PE/DVT (2019), CAD/ stent, Peripheral Neuropathy, Thrombocytopenia,  -Xarelto/ Plavix, Breztri/ spacer Covid vax- 5 Phizer Flu vax- had -----Pt has had a cough for the last 6 months He describes an annoying dry cough for the past 6 months intermittently.  Not bad enough to take anything for at this point.  For about the same time as had a dull persistent ache upper thoracic spine between shoulder blades not affected by movement or lifting. Judithann Sauger has been helpful and he asks refill. He went back into atrial fibrillation about 6 months ago after successful ablation 5 years ago.  Went back into atrial fibrillation again recently. CXR 12/20/20- MPRESSION: No acute abnormalities. Aortic Atherosclerosis (ICD10-I70.0  03/21/22- 88 year old male never smoker followed for COPD/bronchitis, complicated by HBP, peripheral edema, GERD, A. Fib/ Xarelto, PE/DVT (2019), CAD/ stent, CHF,  Peripheral Neuropathy, Thrombocytopenia, Dysphagia, -Xarelto/ Plavix, Breztri/ spacer Covid vax- 5 Phizer Flu vax- had Saw ENT Dr Redmond Baseman for dysphagia/ cricopharygeal hypertonicity> bid  pantoprazole Cough and swallowing somewhat improved with twice daily acid blocker. Dyspnea on exertion is stable-climbing stairs etc.  Little variation day-to-day with weather or other factors.  He does not think it is changed by going in and out of atrial fibrillation. He says most recent assessment indicated ejection fraction now about 30%.  Heart rate slows at times into the 40s and 50s.  He had cardioversion which apparently did not hold.  He is on amiodarone.  We had discussed ways in which a CT scan might be helpful at last visit and he asks about this again. Continues to have back pain which seems to associate with his known arthritic spine changes. CXR 12/20/21 IMPRESSION: 1. No evidence of active disease. 2. Generalized thoracic spine degeneration.    ROS-see HPI + = positive Constitutional:   No-   weight loss, night sweats, fevers, chills, fatigue, lassitude. HEENT:   No-  headaches, difficulty swallowing, tooth/dental problems, sore throat,       No-  sneezing, itching, ear ache, nasal congestion, post nasal drip,  CV:  No-   chest pain, orthopnea, PND, swelling in lower extremities, anasarca, dizziness, +palpitations Resp: + shortness of breath with exertion or at rest.               productive cough,  +non-productive cough,  No- coughing up of blood.              No-   change in color of mucus.  +wheezing.   Skin: No-   rash or lesions. GI:  No-   heartburn, indigestion, abdominal pain, nausea, vomiting,  GU:  MS:  No-   joint pain or swelling. + back pain Neuro-   + feet burn Psych:  No- change in mood or affect. No depression or anxiety.  No memory loss.  OBJ- Physical Exam General- Alert, Oriented, Affect-appropriate,  Distress- none acute, looks well Skin- +ecchymoses on arms  Lymphadenopathy- none Head- atraumatic            Eyes- Gross vision intact, PERRLA, conjunctivae and secretions clear            Ears- Hearing, canals-normal            Nose- Clear, no-Septal  dev, mucus, polyps, erosion, perforation             Throat- Mallampati II , mucosa clear , drainage- none, tonsils- atrophic,  Neck- flexible , trachea midline, no stridor , thyroid nl, carotid no bruit Chest - symmetrical excursion , unlabored           Heart/CV- +IRR/ AFib , no murmur , no gallop  , no rub, nl s1 s2                           - JVD- none , edema- none, stasis changes- none, varices- none           Lung-  +clear, wheeze- none, cough+frequent throat clearing, dullness-none, rub- none           Chest wall-  upper back not tender Abd-   Br/ Gen/ Rectal- Not done, not indicated Extrem- cyanosis- none, clubbing, none, atrophy- none, strength- nl Neuro- grossly intact to observation

## 2022-03-21 ENCOUNTER — Encounter: Payer: Self-pay | Admitting: Internal Medicine

## 2022-03-21 ENCOUNTER — Ambulatory Visit (INDEPENDENT_AMBULATORY_CARE_PROVIDER_SITE_OTHER): Payer: Medicare Other | Admitting: Internal Medicine

## 2022-03-21 VITALS — BP 110/64 | HR 58 | Ht 71.0 in | Wt 194.4 lb

## 2022-03-21 DIAGNOSIS — J449 Chronic obstructive pulmonary disease, unspecified: Secondary | ICD-10-CM | POA: Diagnosis not present

## 2022-03-21 DIAGNOSIS — R053 Chronic cough: Secondary | ICD-10-CM

## 2022-03-21 MED ORDER — IPRATROPIUM-ALBUTEROL 0.5-2.5 (3) MG/3ML IN SOLN: RESPIRATORY_TRACT | 12 refills | Status: AC

## 2022-03-21 NOTE — Assessment & Plan Note (Signed)
Continues Breztri. Got nebulizer but never got medication Plan- order Duoneb through [facility]

## 2022-03-21 NOTE — Patient Instructions (Addendum)
Order- CT chest no contrast    dx Chronic cough/ amiodarone, back pain  Duoneb neb solution ordered through [facility]  Please call as needed

## 2022-03-21 NOTE — Assessment & Plan Note (Signed)
Partly explained by LPR/ GERD/ cricopharyngeal spasm. Concern in this former smoker with COPD, amiodarone, back pain is to make sure we aren't missing something. Continues Xarelto-not concerned about repeat PE. Plan- CT chest

## 2022-04-03 ENCOUNTER — Ambulatory Visit (HOSPITAL_COMMUNITY): Payer: Medicare Other

## 2022-04-07 ENCOUNTER — Ambulatory Visit (HOSPITAL_COMMUNITY)
Admission: RE | Admit: 2022-04-07 | Discharge: 2022-04-07 | Disposition: A | Discharge status: Home / Self Care | Payer: Medicare Other | Source: Ambulatory Visit | Attending: Internal Medicine | Admitting: Internal Medicine

## 2022-04-07 ENCOUNTER — Encounter (HOSPITAL_COMMUNITY): Payer: Self-pay

## 2022-04-07 DIAGNOSIS — R053 Chronic cough: Secondary | ICD-10-CM | POA: Insufficient documentation

## 2022-04-09 ENCOUNTER — Telehealth: Payer: Self-pay | Admitting: Internal Medicine

## 2022-04-09 NOTE — Telephone Encounter (Signed)
Patient is returning phone call for CT results. Patient phone number is [phone].

## 2022-04-10 NOTE — Telephone Encounter (Signed)
Patient is returning phone call again for CT results. Patient phone number is [phone].    He said we called him but I see no Tel Encounters open.

## 2022-04-10 NOTE — Telephone Encounter (Signed)
Theodore Lever, MD 04/07/2022  4:31 PM EST   CT chest- There is benign scarring, but not the kind of progressive fibrosis that we would be concerned about. There is also calcification in the coronary arteries and aortic valve, which you can talk with your cardiologist about.   Spoke with the pt and notified of results per Dr Annamaria Boots  He verbalized understanding  Nothing further needed

## 2022-04-10 NOTE — Progress Notes (Signed)
Spoke with pt and notified of results per Dr. Young Pt verbalized understanding and denied any questions. 

## 2022-04-11 DIAGNOSIS — C4442 Squamous cell carcinoma of skin of scalp and neck: Secondary | ICD-10-CM

## 2022-04-11 HISTORY — DX: Squamous cell carcinoma of skin of scalp and neck: C44.42

## 2022-04-21 ENCOUNTER — Other Ambulatory Visit: Payer: Self-pay | Admitting: Student

## 2022-04-21 DIAGNOSIS — R251 Tremor, unspecified: Secondary | ICD-10-CM

## 2022-04-28 ENCOUNTER — Ambulatory Visit
Admission: RE | Admit: 2022-04-28 | Discharge: 2022-04-28 | Disposition: A | Discharge status: Home / Self Care | Payer: Medicare Other | Source: Ambulatory Visit | Attending: Student | Admitting: Student

## 2022-04-28 DIAGNOSIS — R251 Tremor, unspecified: Secondary | ICD-10-CM | POA: Diagnosis present

## 2022-04-29 ENCOUNTER — Ambulatory Visit (HOSPITAL_BASED_OUTPATIENT_CLINIC_OR_DEPARTMENT_OTHER): Payer: Medicare Other | Admitting: Family

## 2022-05-02 ENCOUNTER — Encounter: Payer: Self-pay | Admitting: Family Medicine

## 2022-05-13 ENCOUNTER — Encounter (HOSPITAL_BASED_OUTPATIENT_CLINIC_OR_DEPARTMENT_OTHER): Payer: Self-pay

## 2022-05-13 ENCOUNTER — Ambulatory Visit (INDEPENDENT_AMBULATORY_CARE_PROVIDER_SITE_OTHER): Payer: Medicare Other

## 2022-05-13 DIAGNOSIS — I5042 Chronic combined systolic (congestive) and diastolic (congestive) heart failure: Secondary | ICD-10-CM | POA: Diagnosis not present

## 2022-05-13 LAB — ECHOCARDIOGRAM COMPLETE
AR max vel: 0.73 cm2
AV Area VTI: 0.71 cm2
AV Area mean vel: 0.71 cm2
AV Mean grad: 8 mmHg
AV Peak grad: 15.5 mmHg
Ao pk vel: 1.97 m/s
Area-P 1/2: 2.37 cm2
MV M vel: 2.59 m/s
MV Peak grad: 26.8 mmHg
P 1/2 time: 795 msec
S' Lateral: 4.58 cm

## 2022-05-13 NOTE — Progress Notes (Signed)
Patient reports episodes of weakness and feeling off for the past month. Patient also thinks blood pressure may be dropping during this episodes. Discussed patient's symptoms and heart rate during echo with nurse. Per nurse advised patient call 911 if symptoms worsen and the nurse would discuss with PA to see if any other actions need to be taken.  Patient agreed.

## 2022-05-18 NOTE — Progress Notes (Unsigned)
Office Visit    Patient Name: Theodore [name] Date of Encounter: 05/19/2022  PCP:  Eustaquio Boyden, MD   [facility]  Cardiologist:  Chilton Si, MD  Advanced Practice Provider:  No care team member to display Electrophysiologist:  Will Jorja Loa, MD   Chief Complaint    Theodore [name] is a 88 y.o. male presents today for heart failure follow-up  Past Medical History    Past Medical History:  Diagnosis Date   Atypical atrial flutter 12/31/2021   CAP (community acquired pneumonia) 01/19/2018   History of arthritis    History of rheumatic fever    Hypercholesterolemia    Hypertension    Nocturia    PAF (paroxysmal atrial fibrillation) 02/2011   Placed on Pradaxa. Did not require cardioversion; spontaneously converted   Peripheral edema    Persistent atrial fibrillation 01/21/2011   S/p ablation 12/2015 NSR up until his PCI Dec 2020 when he went into AF post PCI   Right bundle branch block    SOB (shortness of breath)    Squamous cell carcinoma of scalp 04/2022   at least in situ   Tendonitis of elbow, left    Past Surgical History:  Procedure Laterality Date   ATRIAL FIBRILLATION ABLATION  12/19/2015   BACK SURGERY  12/24/09   fusion C3-C4   BACK SURGERY  2010   fusion L4-L5 Wynetta Emery)   CARDIAC CATHETERIZATION  2009   NONOBSTRUCTIVE ATHERSCLEROTIC CORONARY DISEASE AND NORMAL  LV FUNCTION   CARDIAC CATHETERIZATION N/A 09/08/2014   Procedure: Left Heart Cath and Coronary Angiography;  Surgeon: Lyn Records, MD;  Location: [facility] INVASIVE CV LAB;  Service: Cardiovascular;  Laterality: N/A;   CARDIAC CATHETERIZATION N/A 09/28/2015   Procedure: Left Heart Cath and Coronary Angiography;  Surgeon: Marykay Lex, MD;  Location: [facility] INVASIVE CV LAB;  Service: Cardiovascular;  Laterality: N/A;   CARDIAC CATHETERIZATION N/A 09/28/2015   Procedure: Intravascular Pressure Wire/FFR Study;  Surgeon: Marykay Lex, MD;  Location: [facility] INVASIVE CV LAB;   Service: Cardiovascular;  Laterality: N/A;   CARDIOVERSION N/A 10/23/2015   Procedure: CARDIOVERSION;  Surgeon: Lewayne Bunting, MD;  Location: [facility] ENDOSCOPY;  Service: Cardiovascular;  Laterality: N/A;   CARDIOVERSION N/A 01/01/2022   Procedure: CARDIOVERSION;  Surgeon: Meriam Sprague, MD;  Location: [facility] ENDOSCOPY;  Service: Cardiovascular;  Laterality: N/A;   COLONOSCOPY  2013   per patient, rpt 5 yrs   COLONOSCOPY WITH PROPOFOL N/A 12/02/2018   TAx2, HP, diverticulosis Carman Ching, MD)   CORONARY ATHERECTOMY N/A 01/11/2019   Procedure: CORONARY ATHERECTOMY;  Surgeon: Kathleene Hazel, MD;  Location: [facility] INVASIVE CV LAB;  Service: Cardiovascular;  Laterality: N/A;   CORONARY PRESSURE/FFR STUDY N/A 01/10/2019   Procedure: INTRAVASCULAR PRESSURE WIRE/FFR STUDY;  Surgeon: Marykay Lex, MD;  Location: [facility] INVASIVE CV LAB;  Service: Cardiovascular;  Laterality: N/A;   CORONARY STENT INTERVENTION N/A 01/11/2019   Procedure: CORONARY STENT INTERVENTION;  Surgeon: Kathleene Hazel, MD;  Location: [facility] INVASIVE CV LAB;  Service: Cardiovascular;  Laterality: N/A;   ELECTROPHYSIOLOGIC STUDY N/A 12/19/2015   Procedure: Atrial Fibrillation Ablation;  Surgeon: Will Jorja Loa, MD;  Location: [facility] INVASIVE CV LAB;  Service: Cardiovascular;  Laterality: N/A;   ESOPHAGOGASTRODUODENOSCOPY (EGD) WITH PROPOFOL N/A 12/02/2018   medium HH Carman Ching, MD)   KNEE ARTHROSCOPY Left 03/2016   Dr. Charlann Boxer   LEFT HEART CATH AND CORONARY ANGIOGRAPHY N/A 01/10/2019   Procedure: LEFT HEART CATH AND CORONARY  ANGIOGRAPHY;  Surgeon: Marykay Lex, MD;  Location: [facility] INVASIVE CV LAB;  Service: Cardiovascular;  Laterality: N/A;   PATELLAR TENDON REPAIR Left 2008   POLYPECTOMY  12/02/2018   Procedure: POLYPECTOMY;  Surgeon: Carman Ching, MD;  Location: [facility] ENDOSCOPY;  Service: Endoscopy;;   REPLACEMENT TOTAL KNEE Right 2006   TOTAL KNEE ARTHROPLASTY Left 02/09/2017   Procedure: LEFT TOTAL KNEE  ARTHROPLASTY, EXCISION LEFT DISTAL THIGH MASS;  Surgeon: Durene Romans, MD;  Location: [facility] ORS;  Service: Orthopedics;  Laterality: Left;  90 mins   TRICEPS TENDON REPAIR Left 2013    Allergies  Allergies  Allergen Reactions   Penicillins Anaphylaxis, Rash and Other (See Comments)    "Blistering rash" Has patient had a PCN reaction causing immediate rash, facial/tongue/throat swelling, SOB or lightheadedness with hypotension: Yes Has patient had a PCN reaction causing severe rash involving mucus membranes or skin necrosis: No Has patient had a PCN reaction that required hospitalization: No Has patient had a PCN reaction occurring within the last 10 years: No If all of the above answers are "NO", then may proceed with Cephalosporin use.    Amlodipine Swelling and Other (See Comments)    Peripheral edema   Effexor [Venlafaxine] Other (See Comments)    Tremors/shaking   Lisinopril Cough   Mevacor [Lovastatin] Other (See Comments)    Caused cataracts and elevated liver enzymes   Vicodin [Hydrocodone-Acetaminophen] Nausea And Vomiting   Zocor [Simvastatin] Other (See Comments)    LFT increase with simvastatin and lovastatin    Tamiflu [Oseltamivir Phosphate] Itching and Rash    History of Present Illness    Theodore [name] is a 88 y.o. male with a hx of CAD s/p LAD PCI, chronic diastolic heart failure, HTN, HLD, persistent atrial fibrillation s/p ablation, asthma, LLE DVT, PE, neuropathy,rheumatic fever last seen 03/13/22 by Francis Dowse, PA.   Preivous patient of Dr. Patty Sermons. Diagnosed with atrial fibrillation in 2014. Ablation 12/19/15 by Dr. Elberta Fortis. Myoview 08/2014 for chest pain with old inferior scar with peri infarct ischemia and LVEF 39%. LHC 70% osital LAD, 50% prox LAD, 30% OM1 and 35% RCA. Recommended for medical management. Repeat LHC 09/28/15 with 70% ostial prox LAD lesion with no change in FFR. Left ventriculogram LVEF 35-40%. Improved to LVEF 50% on 03/2018. LHC 01/10/2019 and  due to progression of disease, underwent atherectomy and PCI with DES. Prior myalgia on Atorvastatin 80mg  but tolerates 40mg  and Zetia 10mg  daily.   Underwent L knee replacement 02/09/17. Bleeding at incision site, Xarelto changed to Aspirin. Developed LLE DVT and PE, Xarelto resumed.   Subsequent monitor 07/2019 with PAC, PVC, ventricular bigeminy but no atrial fibrillation. Echo 09/2020 LVEF 55-60%, global hypkinesis, gr2DD. Sherryll Burger and [place] initiated. Echo 12/2020 LVEF 55-60%, indeterminite diastolic function, RA . Saw pharmacy 01/2021 and started on [id number].  05/2021 noted atrial fibrillation. Xarelto resumed.  Required cardioversion for recurrent atrial fibrillation 01/01/22. However, due to recurrent atrial fibrillation, Amiodarone initiated. Saw Afib clinic 01/13/22. Amiodarone reduced to 200mg  QD due to tremor. Toprol 100mg  QD, Xarelto 20mg  QD continued.   Echo 1/22/4 newly reduced LVEF 30-35%, LV global hypokinesis, mild LVH, indeterminate diastolic parameters, RV normal, mild MR, mild ot moderate AS (mean gradient ).   At visit 03/07/22 myoview ordered which revealed no ischemia.   Saw EP 03/13/22 and Amiodarone reduced to 200mg  QOD due to tremor. Monitor 03/28/22 with NSR, no recurrent atrial fibrillation, low PVC burden of 3.6%. Echo 05/13/22 LVEF 30-35%, RV function low normal, trivial MR,  mild AI.  He presents today for follow up independently. 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  He is concerned his weight 18 months ago was between 215 to 220 pounds and has gradually decreased to weight of 195 pounds.  He is concerned his EF started to decrease at time he started Repatha.  We discussed it was more likely related to atrial fibrillation however he would like to trial returning to atorvastatin as his concern regarding potential side effects.  He is also interested in stopping amiodarone as feels his tremors have continued to worsen despite reducing  dose.  Reports continual fatigue and general tiredness.  Tells me wants to be off of Xarelto. Has had issues with nosebleeds, frustrated by poor healing. He does recognize that blood thinner is important.   EKGs/Labs/Other Studies Reviewed:   The following studies were reviewed today:  Echo 03/03/22  1. Left ventricular ejection fraction, by estimation, is 30 to 35%. Left  ventricular ejection fraction by 3D volume is 35 %. The left ventricle has  moderately decreased function. The left ventricle demonstrates global  hypokinesis. There is mild  concentric left ventricular hypertrophy. Left ventricular diastolic  parameters are indeterminate.   2. Right ventricular systolic function is normal. The right ventricular  size is normal.   3. Left atrial size was mildly dilated.   4. Right atrial size was severely dilated.   5. The mitral valve is normal in structure. Mild mitral valve  regurgitation. No evidence of mitral stenosis.   6. The aortic valve is calcified. Aortic valve regurgitation is mild to  moderate. Mild to moderate aortic valve stenosis. Aortic regurgitation PHT  measures 790 msec. Aortic valve area, by VTI measures 1.15 cm. Aortic  valve mean gradient measures 8.0  mmHg. Aortic valve Vmax measures 1.93 m/s.   7. The inferior vena cava is normal in size with greater than 50%  respiratory variability, suggesting right atrial pressure of 3 mmHg.   EKG:  EKG is not ordered today.  Recent Labs: 02/17/2022: ALT 12; Hemoglobin 16.5; Platelets 133.0 03/14/2022: BUN 19; Creatinine, Ser 1.05; Potassium 4.4; Sodium 142; TSH 2.670  Recent Lipid Panel    Component Value Date/Time   CHOL 121 02/17/2022 0808   CHOL 120 06/12/2021 0000   TRIG 94.0 02/17/2022 0808   HDL 52.50 02/17/2022 0808   HDL 53 06/12/2021 0000   CHOLHDL 2 02/17/2022 0808   VLDL 18.8 02/17/2022 0808   LDLCALC 50 02/17/2022 0808   LDLCALC 51 06/12/2021 0000    Risk Assessment/Calculations:   CHA2DS2-VASc  Score = 5   This indicates a 7.2% annual risk of stroke. The patient's score is based upon: CHF History: 1 HTN History: 1 Diabetes History: 0 Stroke History: 0 Vascular Disease History: 1 Age Score: 2 Gender Score: 0     Home Medications   Current Meds  Medication Sig   acetaminophen (TYLENOL) 500 MG tablet Take 1 tablet (500 mg total) by mouth 3 (three) times daily as needed.   amiodarone (PACERONE) 200 MG tablet Take 100 mg by mouth daily.   amoxicillin (AMOXIL) 500 MG tablet Take 2,000 mg by mouth See admin instructions. Dental procedures   atorvastatin (LIPITOR) 40 MG tablet Take 1 tablet (40 mg total) by mouth daily.   Budeson-Glycopyrrol-Formoterol (BREZTRI AEROSPHERE) 160-9-4.8 MCG/ACT AERO Inhale 2 puffs into the lungs 2 (two) times daily.   Cholecalciferol (VITAMIN D3) 2000 units TABS Take 2,000 Units by mouth daily.  cyanocobalamin (VITAMIN B12) 500 MCG tablet Take 1 tablet (500 mcg total) by mouth every other day.   ezetimibe (ZETIA) 10 MG tablet TAKE 1 TABLET BY MOUTH DAILY   FARXIGA 10 MG TABS tablet TAKE 1 TABLET BY MOUTH DAILY  BEFORE BREAKFAST   furosemide (LASIX) 20 MG tablet Take 1 tablet (20 mg total) by mouth daily.   gabapentin (NEURONTIN) 300 MG capsule Take 900 mg by mouth every evening.   ipratropium-albuterol (DUONEB) 0.5-2.5 (3) MG/3ML SOLN 1 neb every 6 hours as needed   Iron, Ferrous Sulfate, 325 (65 Fe) MG TABS Take 325 mg by mouth every Monday, Wednesday, and Friday.   metoprolol succinate (TOPROL-XL) 100 MG 24 hr tablet Take 1 tablet (100 mg total) by mouth daily. Take with or immediately following a meal.   pantoprazole (PROTONIX) 40 MG tablet Take 1 tablet (40 mg total) by mouth 2 (two) times daily.   potassium chloride (KLOR-CON M) 10 MEQ tablet Take 1 tablet (10 mEq total) by mouth daily.   rivaroxaban (XARELTO) 20 MG TABS tablet Take 1 tablet (20 mg total) by mouth daily with supper.   spironolactone (ALDACTONE) 25 MG tablet Take 0.5 tablets  (12.5 mg total) by mouth daily.   traZODone (DESYREL) 50 MG tablet TAKE 1/2 TO 1 TABLET BY MOUTH AT BEDTIME AS NEEDED FOR SLEEP   vitamin C (ASCORBIC ACID) 500 MG tablet Take 500 mg by mouth daily.   [DISCONTINUED] ENTRESTO 97-103 MG TAKE 1 TABLET BY MOUTH TWICE  DAILY (DISCONTINUE VALSARTAN)   [DISCONTINUED] Evolocumab (REPATHA SURECLICK) 140 MG/ML SOAJ Inject 140 mg into the skin every 14 (fourteen) days.     Review of Systems      All other systems reviewed and are otherwise negative except as noted above.  Physical Exam    VS:  BP 122/68   Pulse (!) 42   Ht 5\' 11"  (1.803 m)   Wt 195 lb (88.5 kg)   BMI 27.20 kg/m  , BMI Body mass index is 27.2 kg/m.  Wt Readings from Last 3 Encounters:  05/19/22 195 lb (88.5 kg)  03/21/22 194 lb 6.4 oz (88.2 kg)  03/13/22 193 lb 12.8 oz (87.9 kg)    GEN: Well nourished, well developed, in no acute distress. HEENT: normal. Neck: Supple, no JVD, carotid bruits, or masses. Cardiac: Bradycardic, RRR, no murmurs, rubs, or gallops. No clubbing, cyanosis, edema.  Radials/PT 2+ and equal bilaterally.  Respiratory:  Respirations regular and unlabored, clear to auscultation bilaterally. GI: Soft, nontender, nondistended. MS: No deformity or atrophy. Skin: Warm and dry, no rash. Neuro:  Strength and sensation are intact. Psych: Normal affect.  Assessment & Plan    CAD s/p LAD PCI - Myoview 02/2022 no ischemia. No chest pain. Persistent fatigue, poor activity tolerance of unclear etiology (consider HF, CAD, deconditioning).  GDMT Repatha, Zetia, and metoprolol. Intolerant of statin. No aspirin due to [facility]   Chronic systolic and diastolic heart failure - Euvolemic, reduced EF 30-35% by echo 03/03/22 and 05/13/22. Uncertain etiology as atrial fibrillation better controlled (recent monitor with no AF and PVC burden 3.6%). NYHA II-III with fatigue, general activity intolerance. LVEF did not improve despite GDMT Entresto, Farxiga, Metoprolol, Lasix,  Spironolactone.  Reports hypotension at home, reduce Entresto to half tablet BID.  Recommend discussion with Dr. Elberta Fortis at upcoming appt to consider ICD given persistently reduced LVEF despite GDMT for HF. May also benefit from CRT or CCM but will defer to Dr. Elberta Fortis to determine appropriate therapy.  HTN -  Reports hypotension at home, reduce Entresto to half tablet BID.   HLD - LDL goal <70. Continue Zetia. Interested in discontinuing Repatha (concerned about side effects). Will resume Atorvastatin  daily, stop Repatha, repeat FLP/LFT in 3 months.  Persistent atrial fibrillation s/p ablation / hypercoagulable state / on amiodarone therapy - NSR by auscultation today. Recent 3 day monitor with NSR, PVC burden 3.6%, no PAF. Reports rare palpitations consistent with PVC. Presently on Amiodarone  QD and feels tremors worsened - interested in alternate therapy. Encouraged to discuss with Dr. Elberta Fortis at upcoming OV (likely only candidate for dofetilide). Amiodarone monitoring: 02/17/22 Normal liver enzymes and 03/14/22 normal thyroid panel. CHA2DS2-VASc Score = 5 [CHF History: 1, HTN History: 1, Diabetes History: 0, Stroke History: 0, Vascular Disease History: 1, Age Score: 2, Gender Score: 0].  Therefore, the patient's annual risk of stroke is 7.2 %.   Continues Xarelto.  Due to anemia, nose bleeds, poor wound healing  - will place referral for Watchman  Anemia - Follows with Dr. Truett Perna. Possibly related to long term Xarelto use. Referred for Watchman, as above.        Disposition: Follow up in May as scheduled with Dr. Elberta Fortis and  in August or September   with Chilton Si, MD or APP.  Signed, Alver Sorrow, NP 05/19/2022, 2:55 PM [facility]

## 2022-05-19 ENCOUNTER — Encounter (HOSPITAL_BASED_OUTPATIENT_CLINIC_OR_DEPARTMENT_OTHER): Payer: Self-pay

## 2022-05-19 ENCOUNTER — Ambulatory Visit (INDEPENDENT_AMBULATORY_CARE_PROVIDER_SITE_OTHER): Payer: Medicare Other | Admitting: Family

## 2022-05-19 ENCOUNTER — Encounter (HOSPITAL_BASED_OUTPATIENT_CLINIC_OR_DEPARTMENT_OTHER): Payer: Self-pay | Admitting: Family

## 2022-05-19 VITALS — BP 122/68 | HR 42 | Ht 71.0 in | Wt 195.0 lb

## 2022-05-19 DIAGNOSIS — I25118 Atherosclerotic heart disease of native coronary artery with other forms of angina pectoris: Secondary | ICD-10-CM | POA: Diagnosis not present

## 2022-05-19 DIAGNOSIS — D500 Iron deficiency anemia secondary to blood loss (chronic): Secondary | ICD-10-CM

## 2022-05-19 DIAGNOSIS — I493 Ventricular premature depolarization: Secondary | ICD-10-CM

## 2022-05-19 DIAGNOSIS — E785 Hyperlipidemia, unspecified: Secondary | ICD-10-CM | POA: Diagnosis not present

## 2022-05-19 DIAGNOSIS — I480 Paroxysmal atrial fibrillation: Secondary | ICD-10-CM | POA: Diagnosis not present

## 2022-05-19 DIAGNOSIS — I5022 Chronic systolic (congestive) heart failure: Secondary | ICD-10-CM

## 2022-05-19 DIAGNOSIS — D6859 Other primary thrombophilia: Secondary | ICD-10-CM

## 2022-05-19 DIAGNOSIS — I5042 Chronic combined systolic (congestive) and diastolic (congestive) heart failure: Secondary | ICD-10-CM | POA: Diagnosis not present

## 2022-05-19 MED ORDER — ENTRESTO 97-103 MG PO TABS: 0.5000 | ORAL_TABLET | Freq: Two times a day (BID) | ORAL | 3 refills | Status: DC

## 2022-05-19 MED ORDER — ATORVASTATIN CALCIUM 40 MG PO TABS: 40.0000 mg | ORAL_TABLET | Freq: Every day | ORAL | 3 refills | Status: DC

## 2022-05-19 NOTE — Patient Instructions (Addendum)
Medication Instructions:  Your physician has recommended you make the following change in your medication:   STOP Repatha  START Atorvastatin one 40mg  tablet daily  CHANG Entresto to half tablet twice daily  *If you need a refill on your cardiac medications before your next appointment, please call your pharmacy*   Lab Work: Your physician recommends that you return for lab work in 3 months for fasting lipid panel and LFTs  Please return for Lab work. You may come to the...   [facility] (3rd floor) [address] [place], [place] [address]  Open: 8am-Noon and 1pm-4:30pm  Please ring the doorbell on the small table when you exit the elevator and the Lab Tech will come get you  [facility] at [facility] [address], [place], [place] [address] Open: 8am-1pm, then 2pm-4:30pm   [facility]- Please see attached locations sheet stapled to your lab work with address and hours.    If you have labs (blood work) drawn today and your tests are completely normal, you will receive your results only by: MyChart Message (if you have MyChart) OR A paper copy in the mail If you have any lab test that is abnormal or we need to change your treatment, we will call you to review the results.  Follow-Up: At [facility], you and your health needs are our priority.  As part of our continuing mission to provide you with exceptional heart care, we have created designated Provider Care Teams.  These Care Teams include your primary Cardiologist (physician) and Advanced Practice Providers (APPs -  Physician Assistants and Nurse Practitioners) who all work together to provide you with the care you need, when you need it.  We recommend signing up for the patient portal called "MyChart".  Sign up information is provided on this After Visit Summary.  MyChart is used to connect with patients for Virtual Visits (Telemedicine).  Patients are able to view  lab/test results, encounter notes, upcoming appointments, etc.  Non-urgent messages can be sent to your provider as well.   To learn more about what you can do with MyChart, go to [email or web address].    Your next appointment:   As scheduled with Dr. Elberta Fortis AND In August or September with Dr. Duke Salvia or Alver Sorrow, NP   Other Instructions  Alver Sorrow, NP will reach out to Dr. Elberta Fortis about Amiodarone. The only likely alternative for an anti arrhythmic mediation is dofetilide (tikosyn) which requires hospitalization to start.    There are cardiac devices called CRT and CCM that can help with heart failure symptoms and improve heart muscle. Dr. Elberta Fortis can discuss which of these devices you might qualify for.  A cardiac device called ICD (implanted cardiac defibrillator) is used to help treat dangerous heart rhythms.  We have referred you for a Watchman device today.

## 2022-05-20 ENCOUNTER — Telehealth: Payer: Self-pay

## 2022-05-20 NOTE — Telephone Encounter (Signed)
FYI

## 2022-05-21 ENCOUNTER — Telehealth: Payer: Self-pay

## 2022-05-21 NOTE — Telephone Encounter (Signed)
Per Gillian Shields, called to arrange [facility] consult. The patient declined to schedule visit at this time.  He will have a visit with Dr. Elberta Fortis 07/11/22 and discuss with him.  Will touch base after that visit.

## 2022-05-26 NOTE — Telephone Encounter (Signed)
Pt. Following up with you  

## 2022-05-26 NOTE — Telephone Encounter (Signed)
Pt responding to you

## 2022-06-21 ENCOUNTER — Other Ambulatory Visit (HOSPITAL_BASED_OUTPATIENT_CLINIC_OR_DEPARTMENT_OTHER): Payer: Self-pay | Admitting: Cardiovascular Disease

## 2022-06-23 NOTE — Telephone Encounter (Signed)
Rx request sent to pharmacy.  

## 2022-07-05 ENCOUNTER — Encounter (HOSPITAL_BASED_OUTPATIENT_CLINIC_OR_DEPARTMENT_OTHER): Payer: Self-pay

## 2022-07-08 ENCOUNTER — Encounter: Payer: Self-pay | Admitting: Cardiology

## 2022-07-08 ENCOUNTER — Ambulatory Visit: Payer: Medicare Other | Attending: Cardiology | Admitting: Cardiology

## 2022-07-08 VITALS — BP 102/62 | HR 58 | Ht 71.0 in | Wt 193.6 lb

## 2022-07-08 DIAGNOSIS — I5022 Chronic systolic (congestive) heart failure: Secondary | ICD-10-CM

## 2022-07-08 DIAGNOSIS — I4819 Other persistent atrial fibrillation: Secondary | ICD-10-CM

## 2022-07-08 NOTE — Telephone Encounter (Signed)
Per patient request, scheduled him for consult with Dr. Lalla Brothers 07/10/2022. He was grateful for assistance.

## 2022-07-08 NOTE — Progress Notes (Signed)
Electrophysiology Office Note:    Date:  07/08/2022   ID:  Theodore [name], DOB 10/12/1934, MRN [id number]  [facility] Cardiologist:  Chilton Si, MD  [facility] Electrophysiologist:  Will Jorja Loa, MD   Referring MD: Eustaquio Boyden, MD   Chief Complaint: Atrial fibrillation  History of Present Illness:    Theodore [name] is a 88 y.o. male who I am seeing today for an evaluation of atrial fibrillation and left atrial appendage occlusion at the request of Dr. Elberta Fortis.  The patient last saw Theodore [name] in clinic March 13, 2022.  He has a history of coronary artery disease with PCI, hypertension, hyperlipidemia, atrial fibrillation, neuropathy, right bundle branch block, nonischemic cardiomyopathy.  He has a history of chronic systolic heart failure with now recovered ejection fraction.  He last saw Dr. Elberta Fortis September 2022.  He was previously he had a prior ablation in 2017.  He maintained sinus rhythm for quite some time but is recently developed recurrence.  He has been started on amiodarone in an effort to maintain normal rhythm.  The amiodarone was started in November 2023.  He is on Xarelto for stroke prophylaxis.  He has a history of epistaxis, anemia and is interested in avoiding long-term exposure to anticoagulation.   Today he tells me that he is very concerned about his bleeding on Xarelto. His primary goal is to discontinue this medication. He is actively bleeding from his arm today in clinic and holding pressure. He is also concerned about his tremors that he relates to the amiodarone.     Their past medical, social and family history was reveiwed.   ROS:   Please see the history of present illness.    All other systems reviewed and are negative.  EKGs/Labs/Other Studies Reviewed:    The following studies were reviewed today:  May 13, 2022 echo Ejection fraction 30 to 35% RV function low normal Mildly dilated left atrium Moderately dilated right  atrium Trivial MR Mild AI   March 28, 2022 ZIO monitor personally reviewed No atrial fibrillation  March 11, 2022 SPECT No ischemia  March 07, 2022 EKG shows sinus rhythm with first-degree AV delay and right bundle branch block.  07/08/2022 ECG shows sinus, first degree AV delay and RBBB   Physical Exam:    VS:  BP 102/62   Pulse (!) 58   Ht 5\' 11"  (1.803 m)   Wt 193 lb 9.6 oz (87.8 kg)   SpO2 98%   BMI 27.00 kg/m     Wt Readings from Last 3 Encounters:  07/08/22 193 lb 9.6 oz (87.8 kg)  05/19/22 195 lb (88.5 kg)  03/21/22 194 lb 6.4 oz (88.2 kg)     GEN: Well nourished, well developed in no acute distress. Appears younger than stated age CARDIAC: RRR, no murmurs, rubs, gallops RESPIRATORY:  Clear to auscultation without rales, wheezing or rhonchi  NEURO: tremors in bilateral Ue's.     ASSESSMENT AND PLAN:    1. Persistent atrial fibrillation (HCC)   2. Chronic systolic heart failure (HCC)     #atrial fibrillation Had a prior catheter ablation in 2017.  Now maintained on amiodarone 100 mg by mouth once daily.  On Xarelto for stroke prophylaxis. Has a history of epistaxis and prefers a stroke risk mitigation strategy avoiding long-term exposure anticoagulation.  We discussed left atrial appendage occlusion during today's appointment.  For his AF rhythm control, amiodarone is working but unfortunately he is having tremors that are significantly impacting his  quality of life. I have asked him to stop the amiodarone today. Plan to transition to dofetilide in a few months after the amiodarone has washed out sufficiently. I also discussed this with his primary EP MD, Dr Elberta Fortis.  --------------  I have seen Theodore [name] in the office today who is being considered for a Watchman left atrial appendage closure device. I believe they will benefit from this procedure given their history of atrial fibrillation, CHA2DS2-VASc score of 5 and unadjusted ischemic stroke  rate of 7.2% per year. Unfortunately, the patient is not felt to be a long term anticoagulation candidate secondary to epistaxis and concern about bleeding risk. The patient's chart has been reviewed and I feel that they would be a candidate for short term oral anticoagulation after Watchman implant.   It is my belief that after undergoing a LAA closure procedure, Theodore [name] will not need long term anticoagulation which eliminates anticoagulation side effects and major bleeding risk.   Procedural risks for the Watchman implant have been reviewed with the patient including a 0.5% risk of stroke, <1% risk of perforation and <1% risk of device embolization. Other risks include bleeding, vascular damage, tamponade, worsening renal function, and death. The patient understands these risk and wishes to proceed.     The published clinical data on the safety and effectiveness of WATCHMAN include but are not limited to the following: - Holmes DR, Everlene Farrier, Sick P et al. for the PROTECT AF Investigators. Percutaneous closure of the left atrial appendage versus warfarin therapy for prevention of stroke in patients with atrial fibrillation: a randomised non-inferiority trial. Lancet 2009; 374: 534-42. Everlene Farrier, Doshi SK, Isa Rankin D et al. on behalf of the PROTECT AF Investigators. Percutaneous Left Atrial Appendage Closure for Stroke Prophylaxis in Patients With Atrial Fibrillation 2.3-Year Follow-up of the PROTECT AF (Watchman Left Atrial Appendage System for Embolic Protection in Patients With Atrial Fibrillation) Trial. Circulation 2013; 127:720-729. - Alli O, Doshi S,  Kar S, Reddy VY, Sievert H et al. Quality of Life Assessment in the Randomized PROTECT AF (Percutaneous Closure of the Left Atrial Appendage Versus Warfarin Therapy for Prevention of Stroke in Patients With Atrial Fibrillation) Trial of Patients at Risk for Stroke With Nonvalvular Atrial Fibrillation. J Am Coll Cardiol 2013;  61:1790-8. Aline August DR, Mia Creek, Price M, Whisenant B, Sievert H, Doshi S, Huber K, Reddy V. Prospective randomized evaluation of the Watchman left atrial appendage Device in patients with atrial fibrillation versus long-term warfarin therapy; the PREVAIL trial. Journal of the [employer] of Cardiology, Vol. 4, No. 1, 2014, 1-11. - Kar S, Doshi SK, Sadhu A, Horton R, Osorio J et al. Primary outcome evaluation of a next-generation left atrial appendage closure device: results from the PINNACLE FLX trial. Circulation 2021;143(18)1754-1762.    After today's visit with the patient which was dedicated solely for shared decision making visit regarding LAA closure device, the patient decided to proceed with the LAA appendage closure procedure scheduled to be done in the near future at [facility]. Prior to the procedure, I would like to obtain a gated CT scan of the chest with contrast timed for PV/LA visualization.   HAS-BLED score 3 Hypertension Yes  Abnormal renal and liver function (Dialysis, transplant, Cr >2.26 mg/dL /Cirrhosis or Bilirubin >2x Normal or AST/ALT/AP >3x Normal) No  Stroke No  Bleeding Yes  Labile INR (Unstable/high INR) No  Elderly (>65) Yes  Drugs or alcohol (? 8 drinks/week, anti-plt or  NSAID) No   CHA2DS2-VASc Score = 5  The patient's score is based upon: CHF History: 1 HTN History: 1 Diabetes History: 0 Stroke History: 0 Vascular Disease History: 1 Age Score: 2 Gender Score: 0   He sees Dr Elberta Fortis this week.       Signed, Rossie Muskrat. Lalla Brothers, MD, [facility], [facility] 07/08/2022 3:07 PM    Electrophysiology [facility]

## 2022-07-08 NOTE — Patient Instructions (Signed)
Medication Instructions:  Your physician has recommended you make the following change in your medication:  1) STOP taking amiodarone *If you need a refill on your cardiac medications before your next appointment, please call your pharmacy*  Lab Work: TODAY: BMET If you have labs (blood work) drawn today and your tests are completely normal, you will receive your results only by: MyChart Message (if you have MyChart) OR A paper copy in the mail If you have any lab test that is abnormal or we need to change your treatment, we will call you to review the results.  Testing/Procedures: Your physician has requested that you have cardiac CT. Cardiac computed tomography (CT) is a painless test that uses an x-ray machine to take clear, detailed pictures of your heart. You will be called to schedule.  Follow-Up: At [facility], you and your health needs are our priority.  As part of our continuing mission to provide you with exceptional heart care, we have created designated Provider Care Teams.  These Care Teams include your primary Cardiologist (physician) and Advanced Practice Providers (APPs -  Physician Assistants and Nurse Practitioners) who all work together to provide you with the care you need, when you need it.  You will be contacted by Nurse Navigator, Karsten Fells to schedule your pre-procedure visit and procedure date. If you have any questions she can be reached at [phone].

## 2022-07-09 LAB — BASIC METABOLIC PANEL
BUN/Creatinine Ratio: 17 (ref 10–24)
BUN: 22 mg/dL (ref 8–27)
CO2: 23 mmol/L (ref 20–29)
Calcium: 9.4 mg/dL (ref 8.6–10.2)
Chloride: 97 mmol/L (ref 96–106)
Creatinine, Ser: 1.26 mg/dL (ref 0.76–1.27)
Glucose: 91 mg/dL (ref 70–99)
Potassium: 4.8 mmol/L (ref 3.5–5.2)
Sodium: 136 mmol/L (ref 134–144)
eGFR: 56 mL/min/{1.73_m2} — ABNORMAL LOW (ref >59)

## 2022-07-10 ENCOUNTER — Institutional Professional Consult (permissible substitution): Payer: Medicare Other | Admitting: Cardiology

## 2022-07-11 ENCOUNTER — Ambulatory Visit: Payer: Medicare Other | Attending: Cardiology | Admitting: Cardiology

## 2022-07-11 ENCOUNTER — Encounter: Payer: Self-pay | Admitting: Cardiology

## 2022-07-11 VITALS — BP 124/70 | HR 72 | Ht 71.0 in | Wt 194.0 lb

## 2022-07-11 DIAGNOSIS — I2510 Atherosclerotic heart disease of native coronary artery without angina pectoris: Secondary | ICD-10-CM

## 2022-07-11 DIAGNOSIS — I4819 Other persistent atrial fibrillation: Secondary | ICD-10-CM

## 2022-07-11 DIAGNOSIS — I10 Essential (primary) hypertension: Secondary | ICD-10-CM

## 2022-07-11 DIAGNOSIS — D6869 Other thrombophilia: Secondary | ICD-10-CM

## 2022-07-11 NOTE — Patient Instructions (Signed)
Medication Instructions:  Your physician recommends that you continue on your current medications as directed. Please refer to the Current Medication list given to you today.  *If you need a refill on your cardiac medications before your next appointment, please call your pharmacy*   Lab Work: None ordered   Testing/Procedures: None ordered   Follow-Up: At [facility], you and your health needs are our priority.  As part of our continuing mission to provide you with exceptional heart care, we have created designated Provider Care Teams.  These Care Teams include your primary Cardiologist (physician) and Advanced Practice Providers (APPs -  Physician Assistants and Nurse Practitioners) who all work together to provide you with the care you need, when you need it.  Your next appointment:   3 month(s)  The format for your next appointment:   In Person  Provider:   You will follow up in the Atrial Fibrillation Clinic located at [facility]. Your provider will be: Clint R. Fenton, PA-C  Or  Landry Mellow, PA-C  Thank you for choosing [facility]!!   Dory Horn, RN [phone]

## 2022-07-11 NOTE — Progress Notes (Signed)
Electrophysiology Office Note   Date:  07/11/2022   ID:  Theodore [name], DOB 1935-02-02, MRN [id number]  PCP:  Eustaquio Boyden, MD  Cardiologist:  Josefina Do Electrophysiologist:  Dr Elberta Fortis    CC: Follow up for persistent atrial fibrillation   History of Present Illness: Theodore [name] is a 88 y.o. male who presents today for electrophysiology evaluation.     He has a history of significant hypertension, hyperlipidemia, atrial fibrillation, coronary artery disease, chronic systolic heart failure.  He is post ablation 12/19/2015.  He recently saw Dr. Lalla Brothers for potential watchman implant.  He was noted to have tremors.  He was previously on amiodarone but this was stopped at that visit.  He has plans for watchman implant upcoming.  Today, denies symptoms of palpitations, chest pain, shortness of breath, orthopnea, PND, lower extremity edema, claudication, dizziness, presyncope, syncope, bleeding, or neurologic sequela. The patient is tolerating medications without difficulties.    Past Medical History:  Diagnosis Date   Atypical atrial flutter (HCC) 12/31/2021   CAP (community acquired pneumonia) 01/19/2018   History of arthritis    History of rheumatic fever    Hypercholesterolemia    Hypertension    Nocturia    PAF (paroxysmal atrial fibrillation) (HCC) 02/2011   Placed on Pradaxa. 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   at least in situ   Tendonitis of elbow, left    Past Surgical History:  Procedure Laterality Date   ATRIAL FIBRILLATION ABLATION  12/19/2015   BACK SURGERY  12/24/09   fusion C3-C4   BACK SURGERY  2010   fusion L4-L5 Wynetta Emery)   CARDIAC CATHETERIZATION  2009   NONOBSTRUCTIVE  ATHERSCLEROTIC CORONARY DISEASE AND NORMAL  LV FUNCTION   CARDIAC CATHETERIZATION N/A 09/08/2014   Procedure: Left Heart Cath and Coronary Angiography;  Surgeon: Lyn Records, MD;  Location: [facility] INVASIVE CV LAB;  Service: Cardiovascular;  Laterality: N/A;   CARDIAC CATHETERIZATION N/A 09/28/2015   Procedure: Left Heart Cath and Coronary Angiography;  Surgeon: Marykay Lex, MD;  Location: [facility] INVASIVE CV LAB;  Service: Cardiovascular;  Laterality: N/A;   CARDIAC CATHETERIZATION N/A 09/28/2015   Procedure: Intravascular Pressure Wire/FFR Study;  Surgeon: Marykay Lex, MD;  Location: [facility] INVASIVE CV LAB;  Service: Cardiovascular;  Laterality: N/A;   CARDIOVERSION N/A 10/23/2015   Procedure: CARDIOVERSION;  Surgeon: Lewayne Bunting, MD;  Location: [facility] ENDOSCOPY;  Service: Cardiovascular;  Laterality: N/A;   CARDIOVERSION N/A 01/01/2022   Procedure: CARDIOVERSION;  Surgeon: Meriam Sprague, MD;  Location: [facility] ENDOSCOPY;  Service: Cardiovascular;  Laterality: N/A;   COLONOSCOPY  2013   per patient, rpt 5 yrs   COLONOSCOPY WITH PROPOFOL N/A 12/02/2018   TAx2, HP, diverticulosis Carman Ching, MD)   CORONARY ATHERECTOMY N/A 01/11/2019   Procedure: CORONARY ATHERECTOMY;  Surgeon: Kathleene Hazel, MD;  Location: [facility] INVASIVE CV LAB;  Service: Cardiovascular;  Laterality: N/A;   CORONARY PRESSURE/FFR STUDY N/A 01/10/2019   Procedure: INTRAVASCULAR PRESSURE WIRE/FFR STUDY;  Surgeon: Marykay Lex, MD;  Location: [facility] INVASIVE CV LAB;  Service: Cardiovascular;  Laterality: N/A;   CORONARY STENT INTERVENTION N/A 01/11/2019   Procedure: CORONARY STENT INTERVENTION;  Surgeon: Clifton James,  Nile Dear, MD;  Location: [facility] INVASIVE CV LAB;  Service: Cardiovascular;  Laterality: N/A;   ELECTROPHYSIOLOGIC STUDY N/A 12/19/2015   Procedure: Atrial Fibrillation Ablation;  Surgeon: Terrion Gencarelli Jorja Loa, MD;  Location: [facility] INVASIVE CV LAB;  Service: Cardiovascular;  Laterality: N/A;   ESOPHAGOGASTRODUODENOSCOPY  (EGD) WITH PROPOFOL N/A 12/02/2018   medium HH Carman Ching, MD)   KNEE ARTHROSCOPY Left 03/2016   Dr. Charlann Boxer   LEFT HEART CATH AND CORONARY ANGIOGRAPHY N/A 01/10/2019   Procedure: LEFT HEART CATH AND CORONARY ANGIOGRAPHY;  Surgeon: Marykay Lex, MD;  Location: [facility] INVASIVE CV LAB;  Service: Cardiovascular;  Laterality: N/A;   PATELLAR TENDON REPAIR Left 2008   POLYPECTOMY  12/02/2018   Procedure: POLYPECTOMY;  Surgeon: Carman Ching, MD;  Location: [facility] ENDOSCOPY;  Service: Endoscopy;;   REPLACEMENT TOTAL KNEE Right 2006   TOTAL KNEE ARTHROPLASTY Left 02/09/2017   Procedure: LEFT TOTAL KNEE ARTHROPLASTY, EXCISION LEFT DISTAL THIGH MASS;  Surgeon: Durene Romans, MD;  Location: [facility] ORS;  Service: Orthopedics;  Laterality: Left;  90 mins   TRICEPS TENDON REPAIR Left 2013     Current Outpatient Medications  Medication Sig Dispense Refill   acetaminophen (TYLENOL) 500 MG tablet Take 1 tablet (500 mg total) by mouth 3 (three) times daily as needed. 30 tablet 0   amoxicillin (AMOXIL) 500 MG tablet Take 2,000 mg by mouth See admin instructions. Dental procedures     atorvastatin (LIPITOR) 40 MG tablet Take 1 tablet (40 mg total) by mouth daily. 90 tablet 3   Budeson-Glycopyrrol-Formoterol (BREZTRI AEROSPHERE) 160-9-4.8 MCG/ACT AERO Inhale 2 puffs into the lungs 2 (two) times daily. 32.1 g 3   Cholecalciferol (VITAMIN D3) 2000 units TABS Take 2,000 Units by mouth daily.     cyanocobalamin (VITAMIN B12) 500 MCG tablet Take 1 tablet (500 mcg total) by mouth every other day. 45 tablet 3   ezetimibe (ZETIA) 10 MG tablet TAKE 1 TABLET BY MOUTH DAILY 90 tablet 3   FARXIGA 10 MG TABS tablet TAKE 1 TABLET BY MOUTH DAILY  BEFORE BREAKFAST 90 tablet 3   furosemide (LASIX) 20 MG tablet Take 1 tablet (20 mg total) by mouth daily. 90 tablet 3   gabapentin (NEURONTIN) 300 MG capsule Take 900 mg by mouth every evening.     ipratropium-albuterol (DUONEB) 0.5-2.5 (3) MG/3ML SOLN 1 neb every 6 hours as needed  360 mL 12   Iron, Ferrous Sulfate, 325 (65 Fe) MG TABS Take 325 mg by mouth every Monday, Wednesday, and Friday. 45 tablet 3   metoprolol succinate (TOPROL-XL) 100 MG 24 hr tablet TAKE 1 TABLET BY MOUTH DAILY  WITH OR IMMEDIATELY FOLLOWING A  MEAL 90 tablet 3   pantoprazole (PROTONIX) 40 MG tablet Take 1 tablet (40 mg total) by mouth 2 (two) times daily.     potassium chloride (KLOR-CON M) 10 MEQ tablet Take 1 tablet (10 mEq total) by mouth daily. 90 tablet 3   rivaroxaban (XARELTO) 20 MG TABS tablet Take 1 tablet (20 mg total) by mouth daily with supper. 90 tablet 3   sacubitril-valsartan (ENTRESTO) 97-103 MG Take 0.5 tablets by mouth 2 (two) times daily. 90 tablet 3   spironolactone (ALDACTONE) 25 MG tablet Take 0.5 tablets (12.5 mg total) by mouth daily. 45 tablet 3   traZODone (DESYREL) 50 MG tablet TAKE 1/2 TO 1 TABLET BY MOUTH AT BEDTIME AS NEEDED FOR SLEEP 90 tablet 3   vitamin C (ASCORBIC ACID) 500 MG tablet Take 500 mg by mouth daily.  No current facility-administered medications for this visit.    Allergies:   Penicillins, Amlodipine, Effexor [venlafaxine], Lisinopril, Mevacor [lovastatin], Vicodin [hydrocodone-acetaminophen], Zocor [simvastatin], and Tamiflu [oseltamivir phosphate]   Social History:  The patient  reports that he has never smoked. He has never used smokeless tobacco. He reports current alcohol use of about 14.0 standard drinks of alcohol per week. He reports that he does not use drugs.   Family History:  The patient's family history includes Asthma in his sister; COPD in his brother and father; Congestive Heart Failure (age of onset: 16) in his father; Heart disease in his brother and sister; Hypertension in his father.   ROS:  Please see the history of present illness.   Otherwise, review of systems is positive for none.   All other systems are reviewed and negative.   PHYSICAL EXAM: VS:  BP 124/70   Pulse 72   Ht 5\' 11"  (1.803 m)   Wt 194 lb (88 kg)   SpO2  98%   BMI 27.06 kg/m  , BMI Body mass index is 27.06 kg/m. GEN: Well nourished, well developed, in no acute distress  HEENT: normal  Neck: no JVD, carotid bruits, or masses Cardiac: RRR; no murmurs, rubs, or gallops,no edema  Respiratory:  clear to auscultation bilaterally, normal work of breathing GI: soft, nontender, nondistended, + BS MS: no deformity or atrophy  Skin: warm and dry Neuro:  Strength and sensation are intact Psych: euthymic mood, full affect  EKG:  EKG is not ordered today. Personal review of the ekg ordered 07/08/22 shows sinus rhythm, RBBB   Recent Labs: 02/17/2022: ALT 12; Hemoglobin 16.5; Platelets 133.0 03/14/2022: TSH 2.670 07/08/2022: BUN 22; Creatinine, Ser 1.26; Potassium 4.8; Sodium 136    Lipid Panel     Component Value Date/Time   CHOL 121 02/17/2022 0808   CHOL 120 06/12/2021 0000   TRIG 94.0 02/17/2022 0808   HDL 52.50 02/17/2022 0808   HDL 53 06/12/2021 0000   CHOLHDL 2 02/17/2022 0808   VLDL 18.8 02/17/2022 0808   LDLCALC 50 02/17/2022 0808   LDLCALC 51 06/12/2021 0000     Wt Readings from Last 3 Encounters:  07/11/22 194 lb (88 kg)  07/08/22 193 lb 9.6 oz (87.8 kg)  05/19/22 195 lb (88.5 kg)      Other studies Reviewed: Additional studies/ records that were reviewed today include: Cardiac monitor 07/26/2019 personally reviewed Review of the above records today demonstrates:  Predominant rhythm: sinus rhythm Average heart rate: 69 bpm Max heart rate: 113 bpm Min heart rate: 52 bpm  PACs, PVCs and ventricular bigeminy.  TTE 09/13/20  1. Left ventricular ejection fraction, by estimation, is 45 to 50%. The  left ventricle has mildly decreased function. The left ventricle  demonstrates global hypokinesis. The left ventricular internal cavity size  was moderately dilated. There is mild  eccentric left ventricular hypertrophy. Left ventricular diastolic  parameters are consistent with Grade II diastolic dysfunction   (pseudonormalization). Elevated left atrial pressure.   2. Right ventricular systolic function is mildly reduced. The right  ventricular size is mildly enlarged.   3. Left atrial size was severely dilated.   4. Right atrial size was moderately dilated.   5. The mitral valve is normal in structure. Mild mitral valve  regurgitation.   6. The aortic valve is tricuspid. There is mild calcification of the  aortic valve. There is moderate thickening of the aortic valve. Aortic  valve regurgitation is mild. Mild aortic valve stenosis.  ASSESSMENT AND PLAN:  1.  Persistent atrial fibrillation: Status post ablation 12/19/2015.  Currently on Toprol-XL and Xarelto.  CHA2DS2-VASc of 5.  He was previously on amiodarone but was having tremors and amiodarone was recently stopped.  Remains in sinus rhythm.  He did see Dr. Lalla Brothers for [facility] consult.  He is planning to move forward with this.  His amiodarone was recently stopped due to tremors, we Darrion Macaulay have him follow-up in A-fib clinic in 3 months.  He can have an amiodarone level at that time and if normal, can have discussions on alternative antiarrhythmics.  2.  Coronary artery disease: Status post LAD stent.  No current chest pain.  Plan per primary cardiology.  3.  Chronic systolic heart failure: Ejection fraction 30 to 35%.  Currently on optimal medical therapy per primary cardiology.  4.  Hypertension: Currently well-controlled  5.  Hyperlipidemia: Continue atorvastatin per primary cardiology  6.  Secondary to coagula state: Currently on Xarelto for atrial fibrillation   Current medicines are reviewed at length with the patient today.   The patient does not have concerns regarding his medicines.  The following changes were made today: none  Labs/ tests ordered today include:  No orders of the defined types were placed in this encounter.     Disposition:   FU 3 months  Signed, Bryanne Riquelme Jorja Loa, MD  07/11/2022 9:54 AM     [facility]  [facility] [address] [place] [address] [phone] (office) [phone] (fax)

## 2022-07-15 ENCOUNTER — Telehealth (HOSPITAL_COMMUNITY): Payer: Self-pay | Admitting: *Deleted

## 2022-07-15 NOTE — Telephone Encounter (Signed)
Reaching out to patient to offer assistance regarding upcoming cardiac imaging study; pt verbalizes understanding of appt date/time, parking situation and where to check in, pre-test NPO status, and verified current allergies; name and call back number provided for further questions should they arise  Larey Brick RN Navigator Cardiac Imaging [facility] [phone] office [phone] cell  Patient to take his daily medications and is aware to arrive 10:30am.

## 2022-07-16 ENCOUNTER — Ambulatory Visit (HOSPITAL_COMMUNITY)
Admission: RE | Admit: 2022-07-16 | Discharge: 2022-07-16 | Disposition: A | Discharge status: Home / Self Care | Payer: Medicare Other | Source: Ambulatory Visit | Attending: Cardiology | Admitting: Cardiology

## 2022-07-16 DIAGNOSIS — I5022 Chronic systolic (congestive) heart failure: Secondary | ICD-10-CM | POA: Insufficient documentation

## 2022-07-16 DIAGNOSIS — I4819 Other persistent atrial fibrillation: Secondary | ICD-10-CM

## 2022-07-16 MED ORDER — IOHEXOL 350 MG/ML SOLN
100.0000 mL | Freq: Once | INTRAVENOUS | Status: AC | PRN
  Administered 2022-07-16: 100 mL via INTRAVENOUS

## 2022-07-17 ENCOUNTER — Telehealth (HOSPITAL_BASED_OUTPATIENT_CLINIC_OR_DEPARTMENT_OTHER): Payer: Self-pay | Admitting: Family

## 2022-07-17 NOTE — Telephone Encounter (Signed)
Spoke with patient regarding schedule change for Theodore Shields, NP---old 10/21/22 at 9:15 am---new 10/24/22 at 10:05 am----patient voiced his understadning

## 2022-07-22 ENCOUNTER — Other Ambulatory Visit: Payer: Self-pay

## 2022-07-22 ENCOUNTER — Telehealth: Payer: Self-pay

## 2022-07-22 DIAGNOSIS — I4819 Other persistent atrial fibrillation: Secondary | ICD-10-CM

## 2022-07-22 NOTE — Telephone Encounter (Signed)
Reviewed results with patient who verbalized understanding.   The patient wishes to proceed with LAAO on Wednesday, 09/24/2022. Pre-procedure visit scheduled 09/08/2022. He was grateful for call and agreed with plan.

## 2022-07-22 NOTE — Telephone Encounter (Signed)
-----   Message from Lanier Prude, MD sent at 07/21/2022 10:15 PM EDT ----- OK to continue with watchman evaluation.  Sheria Lang T. Lalla Brothers, MD, [facility], [facility] Cardiac Electrophysiology

## 2022-07-23 ENCOUNTER — Telehealth: Payer: Self-pay

## 2022-07-26 ENCOUNTER — Other Ambulatory Visit: Payer: Self-pay | Admitting: Student

## 2022-07-28 ENCOUNTER — Ambulatory Visit (HOSPITAL_COMMUNITY): Payer: Medicare Other | Admitting: Physician Assistant

## 2022-07-28 NOTE — Telephone Encounter (Signed)
Prescription refill request for Xarelto received.  Indication: AF Last office visit: 07/11/22  Carleene Mains MD Weight: 88kg Age: 88 Scr: 1.26 on 07/08/22  Epic CrCl: 53.35  Based on above findings Xarelto 20mg  daily is the appropriate dose.  Refill approved.

## 2022-08-05 ENCOUNTER — Ambulatory Visit: Payer: Medicare Other | Admitting: Family Medicine

## 2022-08-05 ENCOUNTER — Encounter: Payer: Self-pay | Admitting: Family Medicine

## 2022-08-05 VITALS — BP 110/68 | HR 50 | Temp 97.1°F | Ht 71.0 in | Wt 192.4 lb

## 2022-08-05 DIAGNOSIS — G609 Hereditary and idiopathic neuropathy, unspecified: Secondary | ICD-10-CM | POA: Diagnosis not present

## 2022-08-05 DIAGNOSIS — Z7901 Long term (current) use of anticoagulants: Secondary | ICD-10-CM

## 2022-08-05 DIAGNOSIS — H6121 Impacted cerumen, right ear: Secondary | ICD-10-CM | POA: Diagnosis not present

## 2022-08-05 DIAGNOSIS — I5042 Chronic combined systolic (congestive) and diastolic (congestive) heart failure: Secondary | ICD-10-CM | POA: Insufficient documentation

## 2022-08-05 DIAGNOSIS — E611 Iron deficiency: Secondary | ICD-10-CM | POA: Diagnosis not present

## 2022-08-05 DIAGNOSIS — E7800 Pure hypercholesterolemia, unspecified: Secondary | ICD-10-CM

## 2022-08-05 DIAGNOSIS — I480 Paroxysmal atrial fibrillation: Secondary | ICD-10-CM

## 2022-08-05 DIAGNOSIS — J449 Chronic obstructive pulmonary disease, unspecified: Secondary | ICD-10-CM | POA: Diagnosis not present

## 2022-08-05 NOTE — Assessment & Plan Note (Signed)
Chronic on eliquis, dilt, metoprolol.  Desires to come off [facility], pending Watchman device RA appendage occlusion procedure 09/2022.

## 2022-08-05 NOTE — Assessment & Plan Note (Signed)
Appreciate cardiology care.  °

## 2022-08-05 NOTE — Assessment & Plan Note (Addendum)
Stable period on [facility] controller inhaler.  Not using PRN duoneb  Appreciate pulm care.

## 2022-08-05 NOTE — Patient Instructions (Signed)
Return at your convenience fasting for labwork.  Good to see you today Continue current medicines Return in 6 months for physical

## 2022-08-05 NOTE — Progress Notes (Signed)
Ph: [phone] Fax: [phone]   Patient ID: Theodore [name], male    DOB: 12-21-1934, 88 y.o.   MRN: [id number]  This visit was conducted in person.  BP 110/68   Pulse (!) 50   Temp (!) 97.1 F (36.2 C) (Temporal)   Ht 5\' 11"  (1.803 m)   Wt 192 lb 6 oz (87.3 kg)   SpO2 97%   BMI 26.83 kg/m   Pulse Readings from Last 3 Encounters:  08/05/22 (!) 50  07/11/22 72  07/08/22 (!) 58   CC: 6 mo f/u visit  Subjective:   HPI: AKONI [name] is a 88 y.o. male presenting on 08/05/2022 for Medical Management of Chronic Issues (Here for 6 mo follow up.)   Chronic combined CHF, CAD s/p PCI, atrial fibrillation on Toprol XL and xarelto pending watchman device placement - wants to come off xarelto. Amiodarone was recently stopped. Pending left atrial appendage occlusion [employer]) in August. Having weekly R nostril nosebleed. Has not seen ENT for this recently. He is using saline saline mist as well as some cream.   Periph neuropathy followed by neurology Sherryll Burger) on gabapentin 900mg  at bedtime, ALA supplement, folate supplement.   Postural R hand tremor - sees neurology Dr Sherryll Burger for essential tremor started summer 2023, to consider propranolol instead of metoprolol vs primidone (but would need to be off xarelto).   Chronic stable mild thrombocytopenia - benign normal variant by heme eval Truett Perna). Erythrocytosis thought due to COPD and CHF. Started on oral iron, f/u PRN.   COPD - sees pulm (Young) on controller inhaler breztri, no recent duoneb use.   HLD - repatha stopped 05/2022, restarted atorvastatin in addition to zetia.   Low energy/fatigue - this is some better, maybe since starting oral iron 3d/wk. Also continues vitamin B12 3d/wk.  He continues playing golf and is avoiding summer heat.  Upcoming family beach trip to [place].      Relevant past medical, surgical, family and social history reviewed and updated as indicated. Interim medical history since our last visit  reviewed. Allergies and medications reviewed and updated. Outpatient Medications Prior to Visit  Medication Sig Dispense Refill   acetaminophen (TYLENOL) 500 MG tablet Take 1 tablet (500 mg total) by mouth 3 (three) times daily as needed. 30 tablet 0   amoxicillin (AMOXIL) 500 MG tablet Take 2,000 mg by mouth See admin instructions. Dental procedures     atorvastatin (LIPITOR) 40 MG tablet Take 1 tablet (40 mg total) by mouth daily. 90 tablet 3   Budeson-Glycopyrrol-Formoterol (BREZTRI AEROSPHERE) 160-9-4.8 MCG/ACT AERO Inhale 2 puffs into the lungs 2 (two) times daily. 32.1 g 3   Cholecalciferol (VITAMIN D3) 2000 units TABS Take 2,000 Units by mouth daily.     cyanocobalamin (VITAMIN B12) 500 MCG tablet Take 1 tablet (500 mcg total) by mouth every other day. 45 tablet 3   ezetimibe (ZETIA) 10 MG tablet TAKE 1 TABLET BY MOUTH DAILY 90 tablet 3   FARXIGA 10 MG TABS tablet TAKE 1 TABLET BY MOUTH DAILY  BEFORE BREAKFAST 90 tablet 3   furosemide (LASIX) 20 MG tablet Take 1 tablet (20 mg total) by mouth daily. 90 tablet 3   gabapentin (NEURONTIN) 300 MG capsule Take 900 mg by mouth every evening.     ipratropium-albuterol (DUONEB) 0.5-2.5 (3) MG/3ML SOLN 1 neb every 6 hours as needed 360 mL 12   Iron, Ferrous Sulfate, 325 (65 Fe) MG TABS Take 325 mg by mouth every Monday, Wednesday,  and Friday. 45 tablet 3   metoprolol succinate (TOPROL-XL) 100 MG 24 hr tablet TAKE 1 TABLET BY MOUTH DAILY  WITH OR IMMEDIATELY FOLLOWING A  MEAL 90 tablet 3   pantoprazole (PROTONIX) 40 MG tablet Take 1 tablet (40 mg total) by mouth 2 (two) times daily.     potassium chloride (KLOR-CON M) 10 MEQ tablet Take 1 tablet (10 mEq total) by mouth daily. 90 tablet 3   rivaroxaban (XARELTO) 20 MG TABS tablet TAKE 1 TABLET BY MOUTH DAILY  WITH SUPPER 90 tablet 1   sacubitril-valsartan (ENTRESTO) 97-103 MG Take 0.5 tablets by mouth 2 (two) times daily. 90 tablet 3   spironolactone (ALDACTONE) 25 MG tablet Take 0.5 tablets (12.5 mg  total) by mouth daily. 45 tablet 3   traZODone (DESYREL) 50 MG tablet TAKE 1/2 TO 1 TABLET BY MOUTH AT BEDTIME AS NEEDED FOR SLEEP 90 tablet 3   vitamin C (ASCORBIC ACID) 500 MG tablet Take 500 mg by mouth daily.     No facility-administered medications prior to visit.     Per HPI unless specifically indicated in ROS section below Review of Systems  Objective:  BP 110/68   Pulse (!) 50   Temp (!) 97.1 F (36.2 C) (Temporal)   Ht 5\' 11"  (1.803 m)   Wt 192 lb 6 oz (87.3 kg)   SpO2 97%   BMI 26.83 kg/m   Wt Readings from Last 3 Encounters:  08/05/22 192 lb 6 oz (87.3 kg)  07/11/22 194 lb (88 kg)  07/08/22 193 lb 9.6 oz (87.8 kg)      Physical Exam Vitals and nursing note reviewed.  Constitutional:      Appearance: Normal appearance.  HENT:     Right Ear: Ear canal normal. Decreased hearing noted. There is impacted cerumen.     Left Ear: Hearing, tympanic membrane and ear canal normal.     Mouth/Throat:     Mouth: Mucous membranes are moist.     Pharynx: Oropharynx is clear. No oropharyngeal exudate or posterior oropharyngeal erythema.  Eyes:     Extraocular Movements: Extraocular movements intact.     Pupils: Pupils are equal, round, and reactive to light.  Cardiovascular:     Rate and Rhythm: Bradycardia present. Rhythm irregularly irregular.     Pulses: Normal pulses.     Heart sounds: Normal heart sounds. No murmur heard. Pulmonary:     Effort: Pulmonary effort is normal. No respiratory distress.     Breath sounds: Normal breath sounds. No wheezing, rhonchi or rales.  Musculoskeletal:     Cervical back: Normal range of motion and neck supple.     Right lower leg: No edema.     Left lower leg: No edema.  Neurological:     Mental Status: He is alert.  Psychiatric:        Mood and Affect: Mood normal.        Behavior: Behavior normal.       Results for orders placed or performed in visit on 07/08/22  Basic metabolic panel  Result Value Ref Range   Glucose 91  70 - 99 mg/dL   BUN 22 8 - 27 mg/dL   Creatinine, Ser 1.91 0.76 - 1.27 mg/dL   eGFR 56 (L) >47 WG/NFA/2.13   BUN/Creatinine Ratio 17 10 - 24   Sodium 136 134 - 144 mmol/L   Potassium 4.8 3.5 - 5.2 mmol/L   Chloride 97 96 - 106 mmol/L   CO2 23 20 -  29 mmol/L   Calcium 9.4 8.6 - 10.2 mg/dL   Lab Results  Component Value Date   WBC 5.7 02/17/2022   HGB 16.5 02/17/2022   HCT 49.4 02/17/2022   MCV 99.6 02/17/2022   PLT 133.0 (L) 02/17/2022    Assessment & Plan:   Problem List Items Addressed This Visit     COPD mixed type (HCC) (Chronic)    Stable period on Breztri controller inhaler.  Not using PRN duoneb  Appreciate pulm care.       HLD (hyperlipidemia) - Primary    He is now off Repatha due to possible side effects, back on zetia and atorvastatin. Will return for fasting labs.  The ASCVD Risk score (Arnett DK, et al., 2019) failed to calculate for the following reasons:   The 2019 ASCVD risk score is only valid for ages 34 to 72 d      Relevant Orders   Lipid panel   Hepatic function panel   Atrial fibrillation (HCC)    Chronic on eliquis, dilt, metoprolol.  Desires to come off [facility], pending Watchman device RA appendage occlusion procedure 09/2022.       Chronic anticoagulation   Peripheral neuropathy    Appreciate neurology care.  On gabapentin 900mg  nightly as well as ALA.       Relevant Orders   Vitamin B12   Iron deficiency    Anemia has resolved but iron levels were low - so now on oral iron MWF. Will update levels next labwork.       Relevant Orders   Ferritin   IBC panel   Hearing loss due to cerumen impaction, right    Notes hearing loss due to cerumen impaction on right ear.  IC obtained. Irrigation performed today - able to get plug to entrance of canal, then easily removed with cotton tip applicator, pt tolerated well with resolution of hearing loss.       Chronic combined systolic and diastolic CHF (congestive heart failure) (HCC)    Appreciate  cardiology care.         No orders of the defined types were placed in this encounter.   Orders Placed This Encounter  Procedures   Lipid panel    Standing Status:   Future    Standing Expiration Date:   08/05/2023   Hepatic function panel    Standing Status:   Future    Standing Expiration Date:   08/05/2023   Vitamin B12    Standing Status:   Future    Standing Expiration Date:   08/05/2023   Ferritin    Standing Status:   Future    Standing Expiration Date:   08/05/2023   IBC panel    Standing Status:   Future    Standing Expiration Date:   08/05/2023    Patient Instructions  Return at your convenience fasting for labwork.  Good to see you today Continue current medicines Return in 6 months for physical   Follow up plan: Return in about 7 months (around 02/26/2023) for annual exam, prior fasting for blood work , medicare wellness visit.  Eustaquio Boyden, MD

## 2022-08-05 NOTE — Assessment & Plan Note (Signed)
He is now off Repatha due to possible side effects, back on zetia and atorvastatin. Will return for fasting labs.  The ASCVD Risk score (Arnett DK, et al., 2019) failed to calculate for the following reasons:   The 2019 ASCVD risk score is only valid for ages 58 to 22 d

## 2022-08-05 NOTE — Assessment & Plan Note (Addendum)
Notes hearing loss due to cerumen impaction on right ear.  IC obtained. Irrigation performed today - able to get plug to entrance of canal, then easily removed with cotton tip applicator, pt tolerated well with resolution of hearing loss.

## 2022-08-05 NOTE — Assessment & Plan Note (Addendum)
Appreciate neurology care.  On gabapentin 900mg  nightly as well as ALA.

## 2022-08-05 NOTE — Assessment & Plan Note (Signed)
Anemia has resolved but iron levels were low - so now on oral iron MWF. Will update levels next labwork.

## 2022-08-06 ENCOUNTER — Other Ambulatory Visit (INDEPENDENT_AMBULATORY_CARE_PROVIDER_SITE_OTHER): Payer: Medicare Other

## 2022-08-06 DIAGNOSIS — E7800 Pure hypercholesterolemia, unspecified: Secondary | ICD-10-CM

## 2022-08-06 DIAGNOSIS — E611 Iron deficiency: Secondary | ICD-10-CM

## 2022-08-06 DIAGNOSIS — G609 Hereditary and idiopathic neuropathy, unspecified: Secondary | ICD-10-CM

## 2022-08-06 LAB — HEPATIC FUNCTION PANEL
ALT: 16 U/L (ref 0–53)
AST: 21 U/L (ref 0–37)
Albumin: 4.2 g/dL (ref 3.5–5.2)
Alkaline Phosphatase: 40 U/L (ref 39–117)
Bilirubin, Direct: 0.2 mg/dL (ref 0.0–0.3)
Total Bilirubin: 0.9 mg/dL (ref 0.2–1.2)
Total Protein: 6.2 g/dL (ref 6.0–8.3)

## 2022-08-06 LAB — IBC PANEL
Iron: 200 ug/dL — ABNORMAL HIGH (ref 42–165)
Saturation Ratios: 54.5 % — ABNORMAL HIGH (ref 20.0–50.0)
TIBC: 366.8 ug/dL (ref 250.0–450.0)
Transferrin: 262 mg/dL (ref 212.0–360.0)

## 2022-08-06 LAB — LIPID PANEL
Cholesterol: 101 mg/dL (ref 0–200)
HDL: 49.8 mg/dL (ref >39.00)
LDL Cholesterol: 40 mg/dL (ref 0–99)
NonHDL: 51.45
Total CHOL/HDL Ratio: 2
Triglycerides: 59 mg/dL (ref 0.0–149.0)
VLDL: 11.8 mg/dL (ref 0.0–40.0)

## 2022-08-06 LAB — FERRITIN: Ferritin: 52.6 ng/mL (ref 22.0–322.0)

## 2022-08-06 LAB — VITAMIN B12: Vitamin B-12: 802 pg/mL (ref 211–911)

## 2022-08-13 ENCOUNTER — Other Ambulatory Visit: Payer: Self-pay | Admitting: Family Medicine

## 2022-08-13 DIAGNOSIS — E611 Iron deficiency: Secondary | ICD-10-CM

## 2022-09-05 LAB — LIPID PANEL: Cholesterol, Total: 122 mg/dL (ref 100–199)

## 2022-09-05 NOTE — Progress Notes (Unsigned)
[facility]                                     Cardiology Office Note:    Date:  09/08/2022   ID:  Theodore [name], DOB [birth year], MRN [id number]  PCP:  Eustaquio Boyden, MD  [facility] Cardiologist:  Chilton Si, MD  [facility] Electrophysiologist:  Will Jorja Loa, MD   Referring MD: Eustaquio Boyden, MD   Chief Complaint  Patient presents with   Pre-op Exam    Pre LAAO    History of Present Illness:    Theodore [name] is a 88 y.o. male with a hx of CAD s/p PCI, HTN, HLD, neuropathy, RBBB, NICM with normalization, and atrial fibrillation s/p ablation 2017 with recent AF recurrence. He was restarted on Xarelto however has a hx of epistaxis while on anticoagulation. Given this, he was referred to Dr. Lalla Brothers for potential LAAO with Watchman.   He was seen in consultation 07/08/22 and was felt to be a good candidate to proceed. CTA 6/5 with large broccoli appendage with 2 lobes. A 31 mm Watchman FLX device is recommended. There was no thrombus noted.   He is here today alone and reports that he has been doing well recently. He denies chest pain, SOB, palpitations, LE edema, orthopnea, PND, dizziness, or syncope. Denies bleeding in stool or urine. He does care for his wife at home and may be more comfortable with staying overnight after his procedure so that he may rest better. He will make this decision over the next week or so and will let our team know at pre-procedure call.   Past Medical History:  Diagnosis Date   Atypical atrial flutter (HCC) 12/31/2021   CAP (community acquired pneumonia) 01/19/2018   History of arthritis    History of rheumatic fever    Hypercholesterolemia    Hypertension    Nocturia    PAF (paroxysmal atrial fibrillation) (HCC) 02/2011   Placed on Pradaxa. Did not require cardioversion; spontaneously converted   Peripheral edema    Persistent atrial fibrillation (HCC) 01/21/2011   S/p ablation 12/2015 NSR up until his  PCI Dec 2020 when he went into AF post PCI   Right bundle branch block    SOB (shortness of breath)    Squamous cell carcinoma of scalp 04/2022   at least in situ   Tendonitis of elbow, left     Past Surgical History:  Procedure Laterality Date   ATRIAL FIBRILLATION ABLATION  12/19/2015   BACK SURGERY  12/24/09   fusion C3-C4   BACK SURGERY  2010   fusion L4-L5 Wynetta Emery)   CARDIAC CATHETERIZATION  2009   NONOBSTRUCTIVE ATHERSCLEROTIC CORONARY DISEASE AND NORMAL  LV FUNCTION   CARDIAC CATHETERIZATION N/A 09/08/2014   Procedure: Left Heart Cath and Coronary Angiography;  Surgeon: Lyn Records, MD;  Location: [facility] INVASIVE CV LAB;  Service: Cardiovascular;  Laterality: N/A;   CARDIAC CATHETERIZATION N/A 09/28/2015   Procedure: Left Heart Cath and Coronary Angiography;  Surgeon: Marykay Lex, MD;  Location: [facility] INVASIVE CV LAB;  Service: Cardiovascular;  Laterality: N/A;   CARDIAC CATHETERIZATION N/A 09/28/2015   Procedure: Intravascular Pressure Wire/FFR Study;  Surgeon: Marykay Lex, MD;  Location: [facility] INVASIVE CV LAB;  Service: Cardiovascular;  Laterality: N/A;   CARDIOVERSION N/A 10/23/2015   Procedure: CARDIOVERSION;  Surgeon: Lewayne Bunting, MD;  Location: [facility] ENDOSCOPY;  Service: Cardiovascular;  Laterality: N/A;   CARDIOVERSION N/A 01/01/2022   Procedure: CARDIOVERSION;  Surgeon: Meriam Sprague, MD;  Location: [facility] ENDOSCOPY;  Service: Cardiovascular;  Laterality: N/A;   COLONOSCOPY  2013   per patient, rpt 5 yrs   COLONOSCOPY WITH PROPOFOL N/A 12/02/2018   TAx2, HP, diverticulosis Carman Ching, MD)   CORONARY ATHERECTOMY N/A 01/11/2019   Procedure: CORONARY ATHERECTOMY;  Surgeon: Kathleene Hazel, MD;  Location: [facility] INVASIVE CV LAB;  Service: Cardiovascular;  Laterality: N/A;   CORONARY PRESSURE/FFR STUDY N/A 01/10/2019   Procedure: INTRAVASCULAR PRESSURE WIRE/FFR STUDY;  Surgeon: Marykay Lex, MD;  Location: [facility] INVASIVE CV LAB;  Service: Cardiovascular;   Laterality: N/A;   CORONARY STENT INTERVENTION N/A 01/11/2019   Procedure: CORONARY STENT INTERVENTION;  Surgeon: Kathleene Hazel, MD;  Location: [facility] INVASIVE CV LAB;  Service: Cardiovascular;  Laterality: N/A;   ELECTROPHYSIOLOGIC STUDY N/A 12/19/2015   Procedure: Atrial Fibrillation Ablation;  Surgeon: Will Jorja Loa, MD;  Location: [facility] INVASIVE CV LAB;  Service: Cardiovascular;  Laterality: N/A;   ESOPHAGOGASTRODUODENOSCOPY (EGD) WITH PROPOFOL N/A 12/02/2018   medium HH Carman Ching, MD)   KNEE ARTHROSCOPY Left 03/2016   Dr. Charlann Boxer   LEFT HEART CATH AND CORONARY ANGIOGRAPHY N/A 01/10/2019   Procedure: LEFT HEART CATH AND CORONARY ANGIOGRAPHY;  Surgeon: Marykay Lex, MD;  Location: [facility] INVASIVE CV LAB;  Service: Cardiovascular;  Laterality: N/A;   PATELLAR TENDON REPAIR Left 2008   POLYPECTOMY  12/02/2018   Procedure: POLYPECTOMY;  Surgeon: Carman Ching, MD;  Location: [facility] ENDOSCOPY;  Service: Endoscopy;;   REPLACEMENT TOTAL KNEE Right 2006   TOTAL KNEE ARTHROPLASTY Left 02/09/2017   Procedure: LEFT TOTAL KNEE ARTHROPLASTY, EXCISION LEFT DISTAL THIGH MASS;  Surgeon: Durene Romans, MD;  Location: [facility] ORS;  Service: Orthopedics;  Laterality: Left;  90 mins   TRICEPS TENDON REPAIR Left 2013    Current Medications: Current Meds  Medication Sig   acetaminophen (TYLENOL) 500 MG tablet Take 1 tablet (500 mg total) by mouth 3 (three) times daily as needed.   amoxicillin (AMOXIL) 500 MG tablet Take 2,000 mg by mouth See admin instructions. Dental procedures   atorvastatin (LIPITOR) 40 MG tablet Take 1 tablet (40 mg total) by mouth daily.   Budeson-Glycopyrrol-Formoterol (BREZTRI AEROSPHERE) 160-9-4.8 MCG/ACT AERO Inhale 2 puffs into the lungs 2 (two) times daily.   Cholecalciferol (VITAMIN D3) 2000 units TABS Take 2,000 Units by mouth daily.   cyanocobalamin (VITAMIN B12) 500 MCG tablet Take 1 tablet (500 mcg total) by mouth every other day.   ezetimibe (ZETIA) 10 MG tablet TAKE 1  TABLET BY MOUTH DAILY   FARXIGA 10 MG TABS tablet TAKE 1 TABLET BY MOUTH DAILY  BEFORE BREAKFAST   furosemide (LASIX) 20 MG tablet Take 1 tablet (20 mg total) by mouth daily.   gabapentin (NEURONTIN) 300 MG capsule Take 900 mg by mouth every evening.   ipratropium-albuterol (DUONEB) 0.5-2.5 (3) MG/3ML SOLN 1 neb every 6 hours as needed   metoprolol succinate (TOPROL-XL) 100 MG 24 hr tablet TAKE 1 TABLET BY MOUTH DAILY  WITH OR IMMEDIATELY FOLLOWING A  MEAL   pantoprazole (PROTONIX) 40 MG tablet Take 1 tablet (40 mg total) by mouth 2 (two) times daily.   potassium chloride (KLOR-CON M) 10 MEQ tablet Take 1 tablet (10 mEq total) by mouth daily.   rivaroxaban (XARELTO) 20 MG TABS tablet TAKE 1 TABLET BY MOUTH DAILY  WITH SUPPER   sacubitril-valsartan (ENTRESTO) 97-103 MG Take 0.5 tablets by mouth  2 (two) times daily.   spironolactone (ALDACTONE) 25 MG tablet Take 0.5 tablets (12.5 mg total) by mouth daily.   traZODone (DESYREL) 50 MG tablet TAKE 1/2 TO 1 TABLET BY MOUTH AT BEDTIME AS NEEDED FOR SLEEP   vitamin C (ASCORBIC ACID) 500 MG tablet Take 500 mg by mouth daily.     Allergies:   Penicillins, Amlodipine, Effexor [venlafaxine], Lisinopril, Mevacor [lovastatin], Vicodin [hydrocodone-acetaminophen], Zocor [simvastatin], and Tamiflu [oseltamivir phosphate]   Social History   Socioeconomic History   Marital status: Married    Spouse name: Not on file   Number of children: Not on file   Years of education: Not on file   Highest education level: Master's degree (e.g., MA, MS, MEng, MEd, MSW, MBA)  Occupational History   Occupation: retired    Associate Professor: RETIRED    Comment: Acupuncturist  Tobacco Use   Smoking status: Never   Smokeless tobacco: Never   Tobacco comments:    Never smoke 01/13/22  Vaping Use   Vaping status: Never Used  Substance and Sexual Activity   Alcohol use: Yes    Alcohol/week: 14.0 standard drinks of alcohol    Types: 14 Cans of beer per week    Comment:  2 beers daily 01/13/22   Drug use: No   Sexual activity: Not Currently  Other Topics Concern   Not on file  Social History Narrative   Lives with wife, no pets   Retired   Research scientist (medical): Acupuncturist   Edu: master's   Activity: golf   Diet: good water, fruits/vegetables daily   Social Determinants of Health   Financial Resource Strain: Low Risk  (08/01/2022)   Overall Financial Resource Strain (CARDIA)    Difficulty of Paying Living Expenses: Not hard at all  Food Insecurity: No Food Insecurity (08/01/2022)   Hunger Vital Sign    Worried About Running Out of Food in the Last Year: Never true    Ran Out of Food in the Last Year: Never true  Transportation Needs: No Transportation Needs (08/01/2022)   PRAPARE - Administrator, Civil Service (Medical): No    Lack of Transportation (Non-Medical): No  Physical Activity: Unknown (08/01/2022)   Exercise Vital Sign    Days of Exercise per Week: Patient declined    Minutes of Exercise per Session: 0 min  Stress: No Stress Concern Present (08/01/2022)   [employer] of Occupational Health - Occupational Stress Questionnaire    Feeling of Stress : Only a little  Social Connections: Moderately Integrated (08/01/2022)   Social Connection and Isolation Panel [NHANES]    Frequency of Communication with Friends and Family: More than three times a week    Frequency of Social Gatherings with Friends and Family: More than three times a week    Attends Religious Services: 1 to 4 times per year    Active Member of [employer] or Organizations: No    Attends Engineer, structural: Never    Marital Status: Married     Family History: The patient's family history includes Asthma in his sister; COPD in his brother and father; Congestive Heart Failure (age of onset: 27) in his father; Heart disease in his brother and sister; Hypertension in his father. There is no history of Stroke, Colon cancer, Stomach cancer, or CAD.  ROS:   Please  see the history of present illness.    All other systems reviewed and are negative.  EKGs/Labs/Other Studies Reviewed:    The following studies  were reviewed today:   Cardiac Studies & Procedures   CARDIAC CATHETERIZATION  CARDIAC CATHETERIZATION 01/11/2019  Narrative  Prox LAD to Mid LAD lesion is 80% stenosed.  A drug-eluting stent was successfully placed using a STENT RESOLUTE ONYX 3.5X18.  Post intervention, there is a 0% residual stenosis.  Mid LAD lesion is 70% stenosed.  A drug-eluting stent was successfully placed using a STENT RESOLUTE ONYX 2.75X26.  Post intervention, there is a 0% residual stenosis.  1. Severe, heavily calcified stenoses in the proximal and mid LAD 2. Successful orbital atherectomy of the proximal and mid LAD 3. Successful placement of 2 drug eluting stents in the proximal and mid LAD (overlapping)  Recommendations: Continue ASA and Plavix for at least six months.  Findings Coronary Findings Diagnostic  Dominance: Right  Left Anterior Descending Prox LAD to Mid LAD lesion is 80% stenosed. The lesion is calcified. Mid LAD lesion is 70% stenosed. The lesion is calcified.  Intervention  Prox LAD to Mid LAD lesion Stent CATH VISTA GUIDE 6FR XBLAD3.5 guide catheter was inserted. Lesion crossed with guidewire using a WIRE COUGAR XT STRL 190CM. Pre-stent angioplasty was performed using a BALLOON SAPPHIRE 2.5X20. A drug-eluting stent was successfully placed using a STENT RESOLUTE ONYX 3.5X18. Stent strut is well apposed. Post-stent angioplasty was performed using a BALLOON SAPPHIRE [place] 3.75X8. Post-Intervention Lesion Assessment The intervention was successful. Pre-interventional TIMI flow is 3. Post-intervention TIMI flow is 3. No complications occurred at this lesion. There is a 0% residual stenosis post intervention.  Mid LAD lesion Stent Pre-stent angioplasty was performed using a BALLOON SAPPHIRE 2.5X20. A drug-eluting stent was successfully  placed using a STENT RESOLUTE ONYX 2.75X26. Stent strut is well apposed. Post-stent angioplasty was performed using a BALLOON SAPPHIRE [place] 3.0X8. Post-Intervention Lesion Assessment The intervention was successful. Pre-interventional TIMI flow is 3. Post-intervention TIMI flow is 3. No complications occurred at this lesion. There is a 0% residual stenosis post intervention.   CARDIAC CATHETERIZATION 01/10/2019  Narrative  Prox LAD lesion is 80% stenosed. Prox LAD to Mid LAD lesion is 60% stenosed with 50% stenosed side branch in 2nd Diag.  Dist LAD lesion is 45% stenosed.  Ost Cx to Prox Cx lesion is 10% stenosed. 1st Mrg lesion is 30% stenosed. Prox Cx to Mid Cx lesion is 30% stenosed.  -----------  Dist RCA lesion is 30% stenosed with 30% stenosed side branch in RPDA.  -----------  The left ventricular systolic function is normal. The left ventricular ejection fraction is 55-65% by visual estimate.  LV end diastolic pressure is moderately elevated.  SUMMARY  Diffuse calcified coronary disease with severe single-vessel disease involving the proximal LAD -> 75 to 80% followed by extensive 50% stenosis that is DFR positive at 0.82.  This represents significant progression of disease from 2017  Otherwise mild to moderate disease.  Normal LVEF with moderately elevated LVEDP.   RECOMMENDATIONS  With proximal LAD disease, heavily calcified, this will require atherectomy PCI.  Given the issues with holding Xarelto, and COVID-19 screening test, I feel the best course of action is to place the patient in the hospital tonight and plan for atherectomy tomorrow with Dr. Verne Carrow. ->  We will load Plavix today 300 mg daily, along with aspirin 324 mg.  Would likely be L to restart Xarelto tomorrow night  Continue other home medications.    Bryan Lemma, M.D., M.S. Interventional Cardiologist  Pager # [phone] Phone # [phone] [address] [place], [place] [address]  Findings  Coronary Findings Diagnostic  Dominance: Right  Left Main Vessel is large. The vessel exhibits minimal luminal irregularities. The vessel is mildly calcified.  Left Anterior Descending Prox LAD lesion is 80% stenosed. The lesion is type C, located proximal to the major branch, eccentric and concentric. The lesion is severely calcified. Pressure wire/FFR was performed on the lesion. DFR 0.82 Prox LAD to Mid LAD lesion is 60% stenosed with 50% stenosed side branch in 2nd Diag. The lesion is type C, tubular and eccentric. The lesion is calcified. Dist LAD lesion is 45% stenosed. The lesion is tubular and smooth. With some spasm component responsive to nitroglycerin  First Diagonal Branch Vessel is small in size.  Second Diagonal Branch Vessel is moderate in size.  Lateral Second Diagonal Branch Vessel is small in size.  Left Circumflex Small vessel after the OM takeoff. Ost Cx to Prox Cx lesion is 10% stenosed. The lesion is mildly calcified. Prox Cx to Mid Cx lesion is 30% stenosed.  First Obtuse Marginal Branch Vessel is small in size. Is a very small proximal branch occlusion. 1st Mrg lesion is 30% stenosed.  Second Obtuse Marginal Branch Vessel is small in size.  First Left Posterolateral Branch Vessel is small in size.  Left Posterior Atrioventricular Artery Vessel is small in size.  Right Coronary Artery Vessel is large. There is mild diffuse disease throughout the vessel. Dist RCA lesion is 30% stenosed with 30% stenosed side branch in RPDA.  Acute Marginal Branch Vessel is small in size. Vessel is angiographically normal.  Right Ventricular Branch Vessel is small in size.  Right Posterior Descending Artery Vessel is moderate in size.  Right Posterior Atrioventricular Artery Vessel is small in size.  First Right Posterolateral Branch Vessel is small in size.  Second Right Posterolateral Branch  Third Right  Posterolateral Branch Vessel is small in size.  Intervention  No interventions have been documented.   STRESS TESTS  MYOCARDIAL PERFUSION IMAGING 03/11/2022  Narrative   Findings are consistent with no ischemia. The study is intermediate risk.   No ST deviation was noted.   LV perfusion is abnormal. There is no evidence of ischemia. There is no evidence of infarction. Defect 1: There is a medium defect with moderate reduction in uptake present in the apical to mid apex location(s) that is fixed. There is abnormal wall motion in the defect area. Consistent with artifact caused by diaphragmatic attenuation.   Left ventricular function is abnormal. Global function is moderately reduced. Nuclear stress EF: 43 %. The left ventricular ejection fraction is moderately decreased (30-44%). End diastolic cavity size is mildly enlarged. End systolic cavity size is mildly enlarged.   Prior study available for comparison from 08/11/2014.  Abnormal, intermediate risk stress nuclear study with inferior apical thinning versus prior infarct but no ischemia; gated ejection fraction 43% with global hypokinesis and mild left ventricular enlargement.  Study interpreted as intermediate risk due to reduced LV function.  Suggest echocardiogram to better assess LV function.   ECHOCARDIOGRAM  ECHOCARDIOGRAM COMPLETE 05/13/2022  Narrative ECHOCARDIOGRAM REPORT    Patient Name:   YISRAEL ENGLANDER Date of Exam: 05/13/2022 Medical Rec #:  [id number]       Height:       71.0 in Accession #:    [id number]      Weight:       194.4 lb Date of Birth:  1934/03/20        BSA:          2.083 m  Patient Age:    87 years        BP:           110/60 mmHg Patient Gender: M               HR:           61 bpm. Exam Location:  Outpatient  Procedure: 3D Echo, 2D Echo, Cardiac Doppler, Color Doppler and Strain Analysis  Indications:    CHF  History:        Patient has prior history of Echocardiogram examinations, most recent  03/03/2022. CAD, COPD, Arrythmias:RBBB, PVC and Atrial Flutter; Risk Factors:Hypertension, Dyslipidemia and Non-Smoker.  Sonographer:    Jeryl Columbia RDCS Referring Phys: [id number] CAITLIN S WALKER  IMPRESSIONS   1. Left ventricular ejection fraction, by estimation, is 30 to 35%. Left ventricular ejection fraction by 3D volume is 35 %. The left ventricle has moderately decreased function. The left ventricle demonstrates global hypokinesis. The left ventricular internal cavity size was mildly dilated. There is mild left ventricular hypertrophy. Left ventricular diastolic parameters are indeterminate. 2. Right ventricular systolic function is low normal. The right ventricular size is mildly enlarged. 3. Left atrial size was mildly dilated. 4. Right atrial size was moderately dilated. 5. The mitral valve is abnormal. Trivial mitral valve regurgitation. 6. The aortic valve is tricuspid. Aortic valve regurgitation is mild. Aortic valve sclerosis/calcification is present, without any evidence of aortic stenosis. Aortic valve mean gradient measures 8.0 mmHg. 7. The inferior vena cava is normal in size with greater than 50% respiratory variability, suggesting right atrial pressure of 3 mmHg. 8. Rhythm strip during this exam demonstrates frequent PVC's.  Comparison(s): No significant change from prior study. 03/03/2022: LVEF 30-35%, global hypokinesis.  FINDINGS Left Ventricle: Left ventricular ejection fraction, by estimation, is 30 to 35%. Left ventricular ejection fraction by 3D volume is 35 %. The left ventricle has moderately decreased function. The left ventricle demonstrates global hypokinesis. The left ventricular internal cavity size was mildly dilated. There is mild left ventricular hypertrophy. Left ventricular diastolic parameters are indeterminate.  Right Ventricle: The right ventricular size is mildly enlarged. No increase in right ventricular wall thickness. Right ventricular systolic  function is low normal.  Left Atrium: Left atrial size was mildly dilated.  Right Atrium: Right atrial size was moderately dilated.  Pericardium: There is no evidence of pericardial effusion.  Mitral Valve: The mitral valve is abnormal. There is mild thickening of the anterior and posterior mitral valve leaflet(s). Mild mitral annular calcification. Trivial mitral valve regurgitation.  Tricuspid Valve: The tricuspid valve is grossly normal. Tricuspid valve regurgitation is trivial.  Aortic Valve: The aortic valve is tricuspid. Aortic valve regurgitation is mild. Aortic regurgitation PHT measures 795 msec. Aortic valve sclerosis/calcification is present, without any evidence of aortic stenosis. Aortic valve mean gradient measures 8.0 mmHg. Aortic valve peak gradient measures 15.5 mmHg. Aortic valve area, by VTI measures 0.71 cm.  Pulmonic Valve: The pulmonic valve was grossly normal. Pulmonic valve regurgitation is trivial.  Aorta: The aortic root and ascending aorta are structurally normal, with no evidence of dilitation.  Venous: The inferior vena cava is normal in size with greater than 50% respiratory variability, suggesting right atrial pressure of 3 mmHg.  IAS/Shunts: No atrial level shunt detected by color flow Doppler.  EKG: Rhythm strip during this exam demonstrates frequent PVC's.   LEFT VENTRICLE PLAX 2D LVIDd:         5.89 cm  Diastology LVIDs:         4.58 cm         LV e' medial:    4.95 cm/s LV PW:         1.99 cm         LV E/e' medial:  7.1 LV IVS:        1.13 cm         LV e' lateral:   4.88 cm/s LVOT diam:     2.00 cm         LV E/e' lateral: 7.2 LV SV:         35 LV SV Index:   17 LVOT Area:     3.14 cm        3D Volume EF LV 3D EF:    Left ventricul ar ejection fraction by 3D volume is 35 %.  3D Volume EF: 3D EF:        35 % LV EDV:       194 ml LV ESV:       127 ml LV SV:        67 ml  RIGHT VENTRICLE RV Basal diam:  5.04 cm RV Mid  diam:    4.56 cm RV S prime:     10.10 cm/s TAPSE (M-mode): 2.9 cm  LEFT ATRIUM           Index LA diam:      5.80 cm 2.78 cm/m LA Vol (A4C): 63.6 ml 30.53 ml/m AORTIC VALVE AV Area (Vmax):    0.73 cm AV Area (Vmean):   0.71 cm AV Area (VTI):     0.71 cm AV Vmax:           197.00 cm/s AV Vmean:          133.000 cm/s AV VTI:            0.493 m AV Peak Grad:      15.5 mmHg AV Mean Grad:      8.0 mmHg LVOT Vmax:         45.60 cm/s LVOT Vmean:        30.000 cm/s LVOT VTI:          0.111 m LVOT/AV VTI ratio: 0.23 AI PHT:            795 msec  AORTA Ao Root diam: 3.50 cm Ao Asc diam:  3.10 cm  MITRAL VALVE MV Area (PHT): 2.37 cm    SHUNTS MV Decel Time: 320 msec    Systemic VTI:  0.11 m MR Peak grad: 26.8 mmHg    Systemic Diam: 2.00 cm MR Vmax:      259.00 cm/s MV E velocity: 35.10 cm/s MV A velocity: 47.70 cm/s MV E/A ratio:  0.74  Zoila Shutter MD Electronically signed by Zoila Shutter MD Signature Date/Time: 05/13/2022/3:26:51 PM    Final    MONITORS  LONG TERM MONITOR (3-14 DAYS) 03/28/2022  Narrative Patch Wear Time:  2 days and 22 hours  Predominant rhythm was sinus rhythm Less than 1% ventricular and supraventricular ectopy Less than 1% supraventricular ectopy 3.6% ventricular ectopy Triggered episodes associated with sinus rhythm PVCs  Will Camnitz, MD           EKG:  EKG is ordered today.  The ekg ordered today demonstrates NSR with 1st AV block with PVCs  Recent Labs: 02/17/2022: Hemoglobin 16.5; Platelets 133.0 03/14/2022: TSH 2.670 07/08/2022: BUN 22; Creatinine, Ser 1.26; Potassium 4.8; Sodium 136 09/04/2022:  ALT 17   Recent Lipid Panel    Component Value Date/Time   CHOL 122 09/04/2022 0810   TRIG 64 09/04/2022 0810   HDL 58 09/04/2022 0810   CHOLHDL 2.1 09/04/2022 0810   CHOLHDL 2 08/06/2022 0727   VLDL 11.8 08/06/2022 0727   LDLCALC 50 09/04/2022 0810   Risk Assessment/Calculations:    HAS-BLED score 3 Hypertension Yes  Abnormal  renal and liver function (Dialysis, transplant, Cr >2.26 mg/dL /Cirrhosis or Bilirubin >2x Normal or AST/ALT/AP >3x Normal) No  Stroke No  Bleeding Yes  Labile INR (Unstable/high INR) No  Elderly (>65) Yes  Drugs or alcohol (? 8 drinks/week, anti-plt or NSAID) No    CHA2DS2-VASc Score = 5  The patient's score is based upon: CHF History: 1 HTN History: 1 Diabetes History: 0 Stroke History: 0 Vascular Disease History: 1 Age Score: 2 Gender Score: 0  Physical Exam:    VS:  BP 108/64   Pulse 64   Ht 5\' 11"  (1.803 m)   Wt 196 lb (88.9 kg)   SpO2 96%   BMI 27.34 kg/m     Wt Readings from Last 3 Encounters:  09/08/22 196 lb (88.9 kg)  08/05/22 192 lb 6 oz (87.3 kg)  07/11/22 194 lb (88 kg)    General: Well developed, well nourished, NAD Lungs:Clear to ausculation bilaterally. No wheezes, rales, or rhonchi. Breathing is unlabored. Cardiovascular: RRR with S1 S2. No murmurs Extremities: No edema.  Neuro: Alert and oriented. No focal deficits. No facial asymmetry. MAE spontaneously. Psych: Responds to questions appropriately with normal affect.    ASSESSMENT/PLAN:    PAF: s/p ablation 2017 with more recent recurrence. Xarelto resumed and wishes to proceed with LAAO given hx of epistaxis and high cost of medication. Pre-Watchman instructions reviewed with understanding. CHG soap given. Obtain CBC, BMET today for planned procedure 09/24/22. 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   CAD s/p PCI: Denies anginal symptoms. Plan to restart ASA 81mg  after 6 months of Plavix therapy post implant given CAD.   HTN: Stable with no changes needed at this time.   NICM: Last echo from 05/2022 with LVEF at 30-35%. Doing well with NYHA class I symptoms. Continue Entresto, spironolactone, Toprol, and [place]. Watch BP closely with high dose Entresto. No dizziness reported. Appears  euvolemic on exam today.   Medication Adjustments/Labs and Tests Ordered: Current medicines are reviewed at length with the patient today.  Concerns regarding medicines are outlined above.  Orders Placed This Encounter  Procedures   Basic metabolic panel   CBC   EKG 12-Lead   No orders of the defined types were placed in this encounter.   Patient Instructions  Medication Instructions:  Your physician recommends that you continue on your current medications as directed. Please refer to the Current Medication list given to you today.  *If you need a refill on your cardiac medications before your next appointment, please call your pharmacy*   Lab Work: TODAY: BMET, CBC If you have labs (blood work) drawn today and your tests are completely normal, you will receive your results only by: MyChart Message (if you have MyChart) OR A paper copy in the mail If you have any lab test that is abnormal or we need to change your treatment, we will call you to review the results.   Testing/Procedures: SEE INSTRUCTION LETTER   Follow-Up:  At [facility], you and your health needs are our priority.  As part of our continuing mission to provide you with exceptional heart care, we have created designated Provider Care Teams.  These Care Teams include your primary Cardiologist (physician) and Advanced Practice Providers (APPs -  Physician Assistants and Nurse Practitioners) who all work together to provide you with the care you need, when you need it.  We recommend signing up for the patient portal called "MyChart".  Sign up information is provided on this After Visit Summary.  MyChart is used to connect with patients for Virtual Visits (Telemedicine).  Patients are able to view lab/test results, encounter notes, upcoming appointments, etc.  Non-urgent messages can be sent to your provider as well.   To learn more about what you can do with MyChart, go to [email or web address].    Your  next appointment:   Your post-procedure visits will be arranged during your hospital stay.    Signed, Georgie Chard, NP  09/08/2022 8:55 AM    [facility]

## 2022-09-08 ENCOUNTER — Ambulatory Visit: Payer: Medicare Other | Attending: Cardiovascular Disease | Admitting: Cardiology

## 2022-09-08 VITALS — BP 108/64 | HR 64 | Ht 71.0 in | Wt 196.0 lb

## 2022-09-08 DIAGNOSIS — I493 Ventricular premature depolarization: Secondary | ICD-10-CM | POA: Diagnosis not present

## 2022-09-08 DIAGNOSIS — I4819 Other persistent atrial fibrillation: Secondary | ICD-10-CM | POA: Diagnosis not present

## 2022-09-08 DIAGNOSIS — I5022 Chronic systolic (congestive) heart failure: Secondary | ICD-10-CM

## 2022-09-08 DIAGNOSIS — Z9861 Coronary angioplasty status: Secondary | ICD-10-CM

## 2022-09-08 DIAGNOSIS — I10 Essential (primary) hypertension: Secondary | ICD-10-CM

## 2022-09-08 DIAGNOSIS — Z01818 Encounter for other preprocedural examination: Secondary | ICD-10-CM

## 2022-09-08 DIAGNOSIS — I2510 Atherosclerotic heart disease of native coronary artery without angina pectoris: Secondary | ICD-10-CM

## 2022-09-08 DIAGNOSIS — E785 Hyperlipidemia, unspecified: Secondary | ICD-10-CM

## 2022-09-08 NOTE — Patient Instructions (Addendum)
Medication Instructions:  Your physician recommends that you continue on your current medications as directed. Please refer to the Current Medication list given to you today.  *If you need a refill on your cardiac medications before your next appointment, please call your pharmacy*   Lab Work: TODAY: BMET, CBC If you have labs (blood work) drawn today and your tests are completely normal, you will receive your results only by: MyChart Message (if you have MyChart) OR A paper copy in the mail If you have any lab test that is abnormal or we need to change your treatment, we will call you to review the results.   Testing/Procedures: SEE INSTRUCTION LETTER   Follow-Up: At [facility], you and your health needs are our priority.  As part of our continuing mission to provide you with exceptional heart care, we have created designated Provider Care Teams.  These Care Teams include your primary Cardiologist (physician) and Advanced Practice Providers (APPs -  Physician Assistants and Nurse Practitioners) who all work together to provide you with the care you need, when you need it.  We recommend signing up for the patient portal called "MyChart".  Sign up information is provided on this After Visit Summary.  MyChart is used to connect with patients for Virtual Visits (Telemedicine).  Patients are able to view lab/test results, encounter notes, upcoming appointments, etc.  Non-urgent messages can be sent to your provider as well.   To learn more about what you can do with MyChart, go to [email or web address].    Your next appointment:   Your post-procedure visits will be arranged during your hospital stay.

## 2022-09-16 ENCOUNTER — Other Ambulatory Visit: Payer: Self-pay | Admitting: Cardiovascular Disease

## 2022-09-17 NOTE — Progress Notes (Signed)
Subjective:    Patient ID: Theodore [name], male    DOB: 12-Apr-1934, 88 y.o.   MRN: [id number] HPI male never smoker followed for COPD/bronchitis, complicated by HBP, peripheral edema, GERD, A. Fib/ ablation, CAD, PE/DVT,  PFT 2011 FEV1/FVC 0.65.    PFT 11/30/13- moderate obstruction, insignificant response to bronchodilator, airtrapping, minimal diffusion defect. FEV1 2.21/ 70%, FEV1/FVC 0.63, TLC 97%, DLCO 78%. EKG 03/19/2018-sinus bradycardia with frequent multifocal PVCs/ventricular trigeminy PFT 10/15/2018- Moderate obstruction with response to BD, Airtrapping, Normal Diffusion (Chronic bronchitis pattern) -----------------------------------------------------------------------------------------   03/21/22- 88 year old male never smoker followed for COPD/bronchitis, complicated by HBP, peripheral edema, GERD, A. Fib/ Xarelto, PE/DVT (2019), CAD/ stent, CHF,  Peripheral Neuropathy, Thrombocytopenia, Dysphagia, -Xarelto/ Plavix, Breztri/ spacer Covid vax- 5 Phizer Flu vax- had Saw ENT Dr Jenne Pane for dysphagia/ cricopharygeal hypertonicity> bid pantoprazole Cough and swallowing somewhat improved with twice daily acid blocker. Dyspnea on exertion is stable-climbing stairs etc.  Little variation day-to-day with weather or other factors.  He does not think it is changed by going in and out of atrial fibrillation. He says most recent assessment indicated ejection fraction now about 30%.  Heart rate slows at times into the 40s and 50s.  He had cardioversion which apparently did not hold.  He is on amiodarone.  We had discussed ways in which a CT scan might be helpful at last visit and he asks about this again. Continues to have back pain which seems to associate with his known arthritic spine changes. CXR 12/20/21 IMPRESSION: 1. No evidence of active disease. 2. Generalized thoracic spine degeneration.  09/18/22-  88 year old male never smoker followed for COPD/bronchitis, complicated by HBP, peripheral  edema, GERD, A. Fib/ Xarelto, PE/DVT (2019), CAD/ stent, CHF,  Peripheral Neuropathy, Thrombocytopenia, Dysphagia, -Xarelto/ Plavix, Breztri/ spacer Daily cough productive of some gray sputum.  He says he grew up in a house "full of smokers".  We will provide flutter valve. Pending insertion of Watchman device to reduce risk of stroke from A-fib.  HRCT chest- 04/07/22 MPRESSION: 1. Minimal, bland appearing scarring of the bilateral lung bases. No evidence of fibrotic interstitial lung disease. 2. Scarring, bronchiectasis, and volume loss of the medial segment right middle lobe, consistent with sequelae of prior infection or aspiration. 3. Coronary artery disease. 4. Aortic valve calcifications. Correlate for echocardiographic evidence of aortic valve dysfunction. Aortic Atherosclerosis (ICD10-I70.0).  ROS-see HPI + = positive Constitutional:   No-   weight loss, night sweats, fevers, chills, fatigue, lassitude. HEENT:   No-  headaches, difficulty swallowing, tooth/dental problems, sore throat,       No-  sneezing, itching, ear ache, nasal congestion, post nasal drip,  CV:  No-   chest pain, orthopnea, PND, swelling in lower extremities, anasarca, dizziness, +palpitations Resp: + shortness of breath with exertion or at rest.               productive cough,  +non-productive cough,  No- coughing up of blood.              No-   change in color of mucus.  +wheezing.   Skin: No-   rash or lesions. GI:  No-   heartburn, indigestion, abdominal pain, nausea, vomiting,  GU:  MS:  No-   joint pain or swelling. + back pain Neuro-   + feet burn Psych:  No- change in mood or affect. No depression or anxiety.  No memory loss.  OBJ- Physical Exam General- Alert, Oriented, Affect-appropriate, Distress- none acute, looks well Skin- +ecchymoses on  arms  Lymphadenopathy- none Head- atraumatic            Eyes- Gross vision intact, PERRLA, conjunctivae and secretions clear            Ears- Hearing,  canals-normal            Nose- Clear, no-Septal dev, mucus, polyps, erosion, perforation             Throat- Mallampati II , mucosa clear , drainage- none, tonsils- atrophic,  Neck- flexible , trachea midline, no stridor , thyroid nl, carotid no bruit Chest - symmetrical excursion , unlabored           Heart/CV- +IRR/ AFib , no murmur , no gallop  , no rub, nl s1 s2                           - JVD- none , edema- none, stasis changes- none, varices- none           Lung-  +clear, wheeze- none, cough+light, dullness-none, rub- none           Chest wall-  upper back not tender Abd-   Br/ Gen/ Rectal- Not done, not indicated Extrem- cyanosis- none, clubbing, none, atrophy- none, strength- nl Neuro- grossly intact to observation

## 2022-09-18 ENCOUNTER — Encounter: Payer: Self-pay | Admitting: Internal Medicine

## 2022-09-18 ENCOUNTER — Ambulatory Visit (INDEPENDENT_AMBULATORY_CARE_PROVIDER_SITE_OTHER): Payer: Medicare Other | Admitting: Internal Medicine

## 2022-09-18 VITALS — BP 110/62 | HR 97 | Ht 70.5 in | Wt 198.8 lb

## 2022-09-18 DIAGNOSIS — I480 Paroxysmal atrial fibrillation: Secondary | ICD-10-CM | POA: Diagnosis not present

## 2022-09-18 DIAGNOSIS — J479 Bronchiectasis, uncomplicated: Secondary | ICD-10-CM | POA: Diagnosis not present

## 2022-09-18 DIAGNOSIS — J449 Chronic obstructive pulmonary disease, unspecified: Secondary | ICD-10-CM | POA: Diagnosis not present

## 2022-09-18 NOTE — Patient Instructions (Addendum)
We can continue current meds  Order- Flutter valve-- blow through 4 times per set, 3 sets per day, whenever you need a little help clearing mucus from your airways

## 2022-09-22 ENCOUNTER — Telehealth: Payer: Self-pay

## 2022-09-22 NOTE — Telephone Encounter (Signed)
Confirmed procedure date of 09/24/2022. Confirmed arrival time of 0800 for procedure time at 1030. Reviewed pre-procedure instructions with patient. Confirmed he has stopped his [place] as directed.  The patient understands to call if questions/concerns arise prior to procedure. He was grateful for call and agreed with plan.

## 2022-09-24 ENCOUNTER — Encounter (HOSPITAL_COMMUNITY): Payer: Self-pay | Admitting: Cardiology

## 2022-09-24 ENCOUNTER — Inpatient Hospital Stay (HOSPITAL_COMMUNITY): Payer: Medicare Other | Admitting: Anesthesiology

## 2022-09-24 ENCOUNTER — Inpatient Hospital Stay (HOSPITAL_COMMUNITY)
Admission: RE | Disposition: A | Discharge status: Home / Self Care | Payer: Self-pay | Source: Home / Self Care | Attending: Cardiology

## 2022-09-24 ENCOUNTER — Other Ambulatory Visit: Payer: Self-pay

## 2022-09-24 ENCOUNTER — Inpatient Hospital Stay (HOSPITAL_COMMUNITY): Payer: Medicare Other

## 2022-09-24 ENCOUNTER — Inpatient Hospital Stay (HOSPITAL_COMMUNITY)
Admission: RE | Admit: 2022-09-24 | Discharge: 2022-09-24 | Disposition: A | Discharge status: Home / Self Care | Payer: Medicare Other | Source: Ambulatory Visit | Attending: Cardiology | Admitting: Cardiology

## 2022-09-24 ENCOUNTER — Inpatient Hospital Stay (HOSPITAL_COMMUNITY)
Admission: RE | Admit: 2022-09-24 | Discharge: 2022-09-25 | DRG: 274 | Disposition: A | Discharge status: Home / Self Care | Payer: Medicare Other | Attending: Cardiology | Admitting: Cardiology

## 2022-09-24 DIAGNOSIS — Z7901 Long term (current) use of anticoagulants: Secondary | ICD-10-CM | POA: Diagnosis not present

## 2022-09-24 DIAGNOSIS — R251 Tremor, unspecified: Secondary | ICD-10-CM | POA: Diagnosis present

## 2022-09-24 DIAGNOSIS — I4510 Unspecified right bundle-branch block: Secondary | ICD-10-CM | POA: Diagnosis present

## 2022-09-24 DIAGNOSIS — Z880 Allergy status to penicillin: Secondary | ICD-10-CM

## 2022-09-24 DIAGNOSIS — I110 Hypertensive heart disease with heart failure: Secondary | ICD-10-CM | POA: Diagnosis present

## 2022-09-24 DIAGNOSIS — Z79899 Other long term (current) drug therapy: Secondary | ICD-10-CM | POA: Diagnosis not present

## 2022-09-24 DIAGNOSIS — I4819 Other persistent atrial fibrillation: Secondary | ICD-10-CM

## 2022-09-24 DIAGNOSIS — I2510 Atherosclerotic heart disease of native coronary artery without angina pectoris: Secondary | ICD-10-CM | POA: Diagnosis present

## 2022-09-24 DIAGNOSIS — Z955 Presence of coronary angioplasty implant and graft: Secondary | ICD-10-CM

## 2022-09-24 DIAGNOSIS — I5022 Chronic systolic (congestive) heart failure: Secondary | ICD-10-CM | POA: Diagnosis present

## 2022-09-24 DIAGNOSIS — E785 Hyperlipidemia, unspecified: Secondary | ICD-10-CM | POA: Diagnosis present

## 2022-09-24 DIAGNOSIS — G629 Polyneuropathy, unspecified: Secondary | ICD-10-CM | POA: Diagnosis present

## 2022-09-24 DIAGNOSIS — K219 Gastro-esophageal reflux disease without esophagitis: Secondary | ICD-10-CM | POA: Diagnosis present

## 2022-09-24 DIAGNOSIS — Z006 Encounter for examination for normal comparison and control in clinical research program: Secondary | ICD-10-CM

## 2022-09-24 DIAGNOSIS — E875 Hyperkalemia: Secondary | ICD-10-CM | POA: Diagnosis present

## 2022-09-24 DIAGNOSIS — I4891 Unspecified atrial fibrillation: Secondary | ICD-10-CM

## 2022-09-24 DIAGNOSIS — Z888 Allergy status to other drugs, medicaments and biological substances status: Secondary | ICD-10-CM | POA: Diagnosis not present

## 2022-09-24 DIAGNOSIS — I428 Other cardiomyopathies: Secondary | ICD-10-CM | POA: Diagnosis present

## 2022-09-24 DIAGNOSIS — I5042 Chronic combined systolic (congestive) and diastolic (congestive) heart failure: Secondary | ICD-10-CM

## 2022-09-24 DIAGNOSIS — Z885 Allergy status to narcotic agent status: Secondary | ICD-10-CM | POA: Diagnosis not present

## 2022-09-24 DIAGNOSIS — Z95818 Presence of other cardiac implants and grafts: Secondary | ICD-10-CM

## 2022-09-24 DIAGNOSIS — Z87892 Personal history of anaphylaxis: Secondary | ICD-10-CM | POA: Diagnosis not present

## 2022-09-24 HISTORY — PX: TEE WITHOUT CARDIOVERSION: SHX5443

## 2022-09-24 HISTORY — PX: LEFT ATRIAL APPENDAGE OCCLUSION: EP1229

## 2022-09-24 HISTORY — DX: Presence of other cardiac implants and grafts: Z95.818

## 2022-09-24 LAB — ECHO TEE
AR max vel: 1.56 cm2
AV Area VTI: 1.6 cm2
AV Area mean vel: 1.53 cm2
AV Mean grad: 7 mmHg
AV Peak grad: 9.9 mmHg
Ao pk vel: 1.58 m/s
Est EF: 35
P 1/2 time: 619 msec

## 2022-09-24 LAB — SURGICAL PCR SCREEN
MRSA, PCR: NEGATIVE
Staphylococcus aureus: NEGATIVE

## 2022-09-24 LAB — TYPE AND SCREEN
ABO/RH(D): A POS
Antibody Screen: NEGATIVE

## 2022-09-24 SURGERY — LEFT ATRIAL APPENDAGE OCCLUSION
Anesthesia: General

## 2022-09-24 MED ORDER — CHLORHEXIDINE GLUCONATE 4 % EX SOLN
Freq: Once | CUTANEOUS | Status: DC
  Filled 2022-09-24: qty 15

## 2022-09-24 MED ORDER — FUROSEMIDE 20 MG PO TABS
20.0000 mg | ORAL_TABLET | Freq: Every day | ORAL | Status: DC
  Administered 2022-09-24 – 2022-09-25 (×2): 20 mg via ORAL
  Filled 2022-09-24 (×2): qty 1

## 2022-09-24 MED ORDER — LIDOCAINE 2% (20 MG/ML) 5 ML SYRINGE
INTRAMUSCULAR | Status: DC | PRN
  Administered 2022-09-24: 100 mg via INTRAVENOUS

## 2022-09-24 MED ORDER — SODIUM CHLORIDE 0.9% FLUSH
3.0000 mL | Freq: Two times a day (BID) | INTRAVENOUS | Status: DC
  Administered 2022-09-24: 3 mL via INTRAVENOUS

## 2022-09-24 MED ORDER — ATORVASTATIN CALCIUM 40 MG PO TABS
40.0000 mg | ORAL_TABLET | Freq: Every day | ORAL | Status: DC
  Administered 2022-09-24: 40 mg via ORAL
  Filled 2022-09-24 (×2): qty 1

## 2022-09-24 MED ORDER — GABAPENTIN 300 MG PO CAPS
900.0000 mg | ORAL_CAPSULE | Freq: Every day | ORAL | Status: DC
  Administered 2022-09-24: 900 mg via ORAL
  Filled 2022-09-24: qty 3

## 2022-09-24 MED ORDER — PROTAMINE SULFATE 10 MG/ML IV SOLN
INTRAVENOUS | Status: DC | PRN
  Administered 2022-09-24: 40 mg via INTRAVENOUS

## 2022-09-24 MED ORDER — SODIUM CHLORIDE 0.9 % IV SOLN: INTRAVENOUS | Status: DC

## 2022-09-24 MED ORDER — PHENYLEPHRINE 80 MCG/ML (10ML) SYRINGE FOR IV PUSH (FOR BLOOD PRESSURE SUPPORT)
PREFILLED_SYRINGE | INTRAVENOUS | Status: DC | PRN
  Administered 2022-09-24 (×4): 80 ug via INTRAVENOUS

## 2022-09-24 MED ORDER — HEPARIN (PORCINE) IN NACL 2000-0.9 UNIT/L-% IV SOLN
INTRAVENOUS | Status: DC | PRN
  Administered 2022-09-24: 1000 mL

## 2022-09-24 MED ORDER — PROPOFOL 10 MG/ML IV BOLUS
INTRAVENOUS | Status: DC | PRN
  Administered 2022-09-24: 20 mg via INTRAVENOUS
  Administered 2022-09-24: 50 mg via INTRAVENOUS
  Administered 2022-09-24: 20 mg via INTRAVENOUS

## 2022-09-24 MED ORDER — FENTANYL CITRATE (PF) 250 MCG/5ML IJ SOLN
INTRAMUSCULAR | Status: DC | PRN
  Administered 2022-09-24: 50 ug via INTRAVENOUS

## 2022-09-24 MED ORDER — SUGAMMADEX SODIUM 200 MG/2ML IV SOLN
INTRAVENOUS | Status: DC | PRN
  Administered 2022-09-24: 200 mg via INTRAVENOUS

## 2022-09-24 MED ORDER — PANTOPRAZOLE SODIUM 40 MG PO TBEC
40.0000 mg | DELAYED_RELEASE_TABLET | Freq: Every day | ORAL | Status: DC
  Administered 2022-09-24 – 2022-09-25 (×2): 40 mg via ORAL
  Filled 2022-09-24 (×2): qty 1

## 2022-09-24 MED ORDER — METOPROLOL SUCCINATE ER 100 MG PO TB24
100.0000 mg | ORAL_TABLET | Freq: Every day | ORAL | Status: DC
  Administered 2022-09-25: 100 mg via ORAL
  Filled 2022-09-24: qty 1

## 2022-09-24 MED ORDER — SODIUM CHLORIDE 0.9 % IV SOLN: 250.0000 mL | INTRAVENOUS | Status: DC | PRN

## 2022-09-24 MED ORDER — HEPARIN (PORCINE) IN NACL 1000-0.9 UT/500ML-% IV SOLN
INTRAVENOUS | Status: DC | PRN
  Administered 2022-09-24: 500 mL

## 2022-09-24 MED ORDER — DAPAGLIFLOZIN PROPANEDIOL 10 MG PO TABS
10.0000 mg | ORAL_TABLET | Freq: Every day | ORAL | Status: DC
  Administered 2022-09-25: 10 mg via ORAL
  Filled 2022-09-24: qty 1

## 2022-09-24 MED ORDER — VANCOMYCIN HCL IN DEXTROSE 1-5 GM/200ML-% IV SOLN
1000.0000 mg | INTRAVENOUS | Status: AC
  Administered 2022-09-24: 1000 mg via INTRAVENOUS
  Filled 2022-09-24: qty 200

## 2022-09-24 MED ORDER — ONDANSETRON HCL 4 MG/2ML IJ SOLN: 4.0000 mg | Freq: Four times a day (QID) | INTRAMUSCULAR | Status: DC | PRN

## 2022-09-24 MED ORDER — SPIRONOLACTONE 12.5 MG HALF TABLET
12.5000 mg | ORAL_TABLET | Freq: Every day | ORAL | Status: DC
  Administered 2022-09-24 – 2022-09-25 (×2): 12.5 mg via ORAL
  Filled 2022-09-24 (×2): qty 1

## 2022-09-24 MED ORDER — SACUBITRIL-VALSARTAN 97-103 MG PO TABS
1.0000 | ORAL_TABLET | Freq: Two times a day (BID) | ORAL | Status: DC
  Administered 2022-09-24 – 2022-09-25 (×2): 1 via ORAL
  Filled 2022-09-24 (×2): qty 1

## 2022-09-24 MED ORDER — PHENYLEPHRINE HCL-NACL 20-0.9 MG/250ML-% IV SOLN
INTRAVENOUS | Status: DC | PRN
  Administered 2022-09-24: 30 ug/min via INTRAVENOUS

## 2022-09-24 MED ORDER — DEXAMETHASONE SODIUM PHOSPHATE 10 MG/ML IJ SOLN
INTRAMUSCULAR | Status: DC | PRN
  Administered 2022-09-24: 5 mg via INTRAVENOUS

## 2022-09-24 MED ORDER — ROCURONIUM BROMIDE 10 MG/ML (PF) SYRINGE
PREFILLED_SYRINGE | INTRAVENOUS | Status: DC | PRN
  Administered 2022-09-24: 70 mg via INTRAVENOUS

## 2022-09-24 MED ORDER — POTASSIUM CHLORIDE CRYS ER 20 MEQ PO TBCR
10.0000 meq | EXTENDED_RELEASE_TABLET | Freq: Every day | ORAL | Status: DC
  Administered 2022-09-24: 10 meq via ORAL
  Filled 2022-09-24 (×2): qty 1

## 2022-09-24 MED ORDER — GLYCOPYRROLATE 0.2 MG/ML IJ SOLN
INTRAMUSCULAR | Status: DC | PRN
Start: 2022-09-24 — End: 2022-09-24
  Administered 2022-09-24 (×2): .1 mg via INTRAVENOUS

## 2022-09-24 MED ORDER — HEPARIN SODIUM (PORCINE) 1000 UNIT/ML IJ SOLN
INTRAMUSCULAR | Status: DC | PRN
Start: 2022-09-24 — End: 2022-09-24
  Administered 2022-09-24: 13000 [IU] via INTRAVENOUS

## 2022-09-24 MED ORDER — RIVAROXABAN 20 MG PO TABS
20.0000 mg | ORAL_TABLET | Freq: Every day | ORAL | Status: DC
  Administered 2022-09-24: 20 mg via ORAL
  Filled 2022-09-24: qty 1

## 2022-09-24 MED ORDER — SODIUM CHLORIDE 0.9% FLUSH: 3.0000 mL | INTRAVENOUS | Status: DC | PRN

## 2022-09-24 MED ORDER — ONDANSETRON HCL 4 MG/2ML IJ SOLN
INTRAMUSCULAR | Status: DC | PRN
  Administered 2022-09-24: 4 mg via INTRAVENOUS

## 2022-09-24 MED ORDER — FENTANYL CITRATE (PF) 100 MCG/2ML IJ SOLN
INTRAMUSCULAR | Status: AC
  Filled 2022-09-24: qty 2

## 2022-09-24 MED ORDER — CHLORHEXIDINE GLUCONATE 0.12 % MT SOLN
15.0000 mL | OROMUCOSAL | Status: AC
  Administered 2022-09-24: 15 mL via OROMUCOSAL
  Filled 2022-09-24 (×2): qty 15

## 2022-09-24 MED ORDER — LACTATED RINGERS IV SOLN: INTRAVENOUS | Status: DC

## 2022-09-24 MED ORDER — IOHEXOL 350 MG/ML SOLN
INTRAVENOUS | Status: DC | PRN
  Administered 2022-09-24: 10 mL

## 2022-09-24 SURGICAL SUPPLY — 19 items
BLANKET WARM UNDERBOD FULL ACC (MISCELLANEOUS) ×1 IMPLANT
CATH DIAG 6FR PIGTAIL ANGLED (CATHETERS) IMPLANT
CATH WATCHMAN STEER ACCESS SYS (CATHETERS) IMPLANT
CLOSURE PERCLOSE PROSTYLE (VASCULAR PRODUCTS) IMPLANT
DEVICE WATCHMAN FLX PRO PROC (KITS) IMPLANT
DEVICE WATCHMAN TRUSTEER PROC (KITS) IMPLANT
DILATOR VESSEL 38 20CM 14FR (INTRODUCER) IMPLANT
KIT HEART LEFT (KITS) ×1 IMPLANT
KIT SHEA VERSACROSS LAAC CONNE (KITS) IMPLANT
PACK CARDIAC CATHETERIZATION (CUSTOM PROCEDURE TRAY) ×1 IMPLANT
PAD DEFIB RADIO PHYSIO CONN (PAD) ×1 IMPLANT
SHEATH PERFORMER 18FRX30 (VASCULAR PRODUCTS) IMPLANT
SHEATH PINNACLE 8F 10CM (SHEATH) IMPLANT
SHEATH PROBE COVER 6X72 (BAG) ×1 IMPLANT
TRANSDUCER W/STOPCOCK (MISCELLANEOUS) ×1 IMPLANT
TUBING CIL FLEX 10 FLL-RA (TUBING) ×1 IMPLANT
WATCHMAN FLX PRO 27 (Prosthesis & Implant Heart) IMPLANT
WATCHMAN FLX PRO PROCEDURE (KITS) ×1 IMPLANT
WATCHMAN TRUSTEER PROCEDURE (KITS) ×1 IMPLANT

## 2022-09-24 NOTE — Anesthesia Procedure Notes (Signed)
Procedure Name: Intubation Date/Time: 09/24/2022 10:20 AM  Performed by: Alease Medina, CRNAPre-anesthesia Checklist: Patient identified, Emergency Drugs available, Suction available and Patient being monitored Patient Re-evaluated:Patient Re-evaluated prior to induction Oxygen Delivery Method: Circle system utilized Preoxygenation: Pre-oxygenation with 100% oxygen Induction Type: IV induction Ventilation: Mask ventilation without difficulty and Oral airway inserted - appropriate to patient size Laryngoscope Size: Mac and 4 Grade View: Grade II Tube type: Oral Tube size: 7.5 mm Number of attempts: 1 Airway Equipment and Method: Stylet and Oral airway Placement Confirmation: ETT inserted through vocal cords under direct vision, positive ETCO2 and breath sounds checked- equal and bilateral Secured at: 22 cm Tube secured with: Tape Dental Injury: Teeth and Oropharynx as per pre-operative assessment

## 2022-09-24 NOTE — Anesthesia Preprocedure Evaluation (Signed)
Anesthesia Evaluation  Patient identified by MRN, date of birth, ID band Patient awake    Reviewed: Allergy & Precautions, NPO status , Patient's Chart, lab work & pertinent test results, reviewed documented beta blocker date and time   History of Anesthesia Complications Negative for: history of anesthetic complications  Airway Mallampati: II  TM Distance: >3 FB Neck ROM: Full    Dental no notable dental hx.    Pulmonary neg shortness of breath, asthma , pneumonia, COPD,  COPD inhaler, Not current smoker   breath sounds clear to auscultation       Cardiovascular hypertension, (-) angina + CAD, + Cardiac Stents and +CHF (EF 30-35%)  (-) Past MI and (-) CABG + dysrhythmias  Rhythm:Regular     Neuro/Psych  PSYCHIATRIC DISORDERS       Neuromuscular disease    GI/Hepatic ,GERD  ,,(+) neg Cirrhosis        Endo/Other    Renal/GU Renal disease     Musculoskeletal  (+) Arthritis ,    Abdominal   Peds  Hematology   Anesthesia Other Findings   Reproductive/Obstetrics                              Anesthesia Physical Anesthesia Plan  ASA: 3  Anesthesia Plan: General   Post-op Pain Management:    Induction: Intravenous  PONV Risk Score and Plan:   Airway Management Planned: Oral ETT  Additional Equipment: ClearSight  Intra-op Plan:   Post-operative Plan: Extubation in OR  Informed Consent: I have reviewed the patients History and Physical, chart, labs and discussed the procedure including the risks, benefits and alternatives for the proposed anesthesia with the patient or authorized representative who has indicated his/her understanding and acceptance.     Dental advisory given  Plan Discussed with: CRNA  Anesthesia Plan Comments:          Anesthesia Quick Evaluation

## 2022-09-24 NOTE — Anesthesia Postprocedure Evaluation (Signed)
Anesthesia Post Note  Patient: Theodore [name]  Procedure(s) Performed: LEFT ATRIAL APPENDAGE OCCLUSION TRANSESOPHAGEAL ECHOCARDIOGRAM     Patient location during evaluation: PACU Anesthesia Type: General Level of consciousness: awake and alert Pain management: pain level controlled Vital Signs Assessment: post-procedure vital signs reviewed and stable Respiratory status: spontaneous breathing, nonlabored ventilation, respiratory function stable and patient connected to nasal cannula oxygen Cardiovascular status: blood pressure returned to baseline and stable Postop Assessment: no apparent nausea or vomiting Anesthetic complications: no   There were no known notable events for this encounter.  Last Vitals:  Vitals:   09/24/22 1300 09/24/22 1330  BP: 111/68 122/71  Pulse: 60 (!) 51  Resp: 16 13  Temp:    SpO2: (!) 87% 99%    Last Pain:  Vitals:   09/24/22 1210  TempSrc: Temporal  PainSc: 0-No pain                 Mariann Barter

## 2022-09-24 NOTE — Plan of Care (Signed)

## 2022-09-24 NOTE — H&P (Signed)
Electrophysiology Office Note:     Date:  09/24/2022    ID:  Theodore [name], DOB Jul 23, 1934, MRN [id number]   [facility] Cardiologist:  Theodore Si, MD  [facility] Electrophysiologist:  Theodore Jorja Loa, MD    Referring MD: Theodore Boyden, MD    Chief Complaint: Atrial fibrillation   History of Present Illness:     Theodore [name] is a 88 y.o. male who I am seeing today for an evaluation of atrial fibrillation and left atrial appendage occlusion at the request of Dr. Elberta [name].   The patient last saw Theodore [name] in clinic March 13, 2022.   He has a history of coronary artery disease with PCI, hypertension, hyperlipidemia, atrial fibrillation, neuropathy, right bundle branch block, nonischemic cardiomyopathy.  He has a history of chronic systolic heart failure with now recovered ejection fraction.  He last saw Dr. Elberta [name] September 2022.  He was previously he had a prior ablation in 2017.  He maintained sinus rhythm for quite some time but is recently developed recurrence.  He has been started on amiodarone in an effort to maintain normal rhythm.  The amiodarone was started in November 2023.  He is on Xarelto for stroke prophylaxis.   He has a history of epistaxis, anemia and is interested in avoiding long-term exposure to anticoagulation.     Today he tells me that he is very concerned about his bleeding on Xarelto. His primary goal is to discontinue this medication. He is actively bleeding from his arm today in clinic and holding pressure. He is also concerned about his tremors that he relates to the amiodarone.    Presents for LAAO today.   Objective Their past medical, social and family history was reveiwed.     ROS:   Please see the history of present illness.    All other systems reviewed and are negative.   EKGs/Labs/Other Studies Reviewed:     The following studies were reviewed today:   May 13, 2022 echo Ejection fraction 30 to 35% RV function low  normal Mildly dilated left atrium Moderately dilated right atrium Trivial MR Mild AI     March 28, 2022 ZIO monitor personally reviewed No atrial fibrillation   March 11, 2022 SPECT No ischemia   March 07, 2022 EKG shows sinus rhythm with first-degree AV delay and right bundle branch block.   07/08/2022 ECG shows sinus, first degree AV delay and RBBB     Physical Exam:     VS:  BP 126/66   Pulse 63   Ht 5\' 11"  (1.803 m)   Wt 193 lb 9.6 oz (87.8 kg)   SpO2 98%   BMI 27.00 kg/m         Wt  from Last 3 Encounters:  07/08/22 193 lb 9.6 oz (87.8 kg)  05/19/22 195 lb (88.5 kg)  03/21/22 194 lb 6.4 oz (88.2 kg)      GEN: Well nourished, well developed in no acute distress. Appears younger than stated age CARDIAC: RRR, no murmurs, rubs, gallops RESPIRATORY:  Clear to auscultation without rales, wheezing or rhonchi  NEURO: tremors in bilateral Ue's.       Assessment ASSESSMENT AND PLAN:     1. Persistent atrial fibrillation (HCC)   2. Chronic systolic heart failure (HCC)       #atrial fibrillation Had a prior catheter ablation in 2017.  Now maintained on amiodarone 100 mg by mouth once daily.  On Xarelto for stroke prophylaxis. Has a history of epistaxis  and prefers a stroke risk mitigation strategy avoiding long-term exposure anticoagulation.  We discussed left atrial appendage occlusion during today's appointment.   For his AF rhythm control, amiodarone is working but unfortunately he is having tremors that are significantly impacting his quality of life. I have asked him to stop the amiodarone today. Plan to transition to dofetilide in a few months after the amiodarone has washed out sufficiently. I also discussed this with his primary EP MD, Dr Theodore [name].   --------------   I have seen Theodore [name] in the office today who is being considered for a Watchman left atrial appendage closure device. I believe they Theodore benefit from this procedure given their  history of atrial fibrillation, CHA2DS2-VASc score of 5 and unadjusted ischemic stroke rate of 7.2% per year. Unfortunately, the patient is not felt to be a long term anticoagulation candidate secondary to epistaxis and concern about bleeding risk. The patient's chart has been reviewed and I feel that they would be a candidate for short term oral anticoagulation after Watchman implant.    It is my belief that after undergoing a LAA closure procedure, Theodore [name] Theodore not need long term anticoagulation which eliminates anticoagulation side effects and major bleeding risk.    Procedural risks for the Watchman implant have been reviewed with the patient including a 0.5% risk of stroke, <1% risk of perforation and <1% risk of device embolization. Other risks include bleeding, vascular damage, tamponade, worsening renal function, and death. The patient understands these risk and wishes to proceed.       The published clinical data on the safety and effectiveness of WATCHMAN include but are not limited to the following: - Holmes DR, Everlene Farrier, Sick P et al. for the PROTECT AF Investigators. Percutaneous closure of the left atrial appendage versus warfarin therapy for prevention of stroke in patients with atrial fibrillation: a randomised non-inferiority trial. Lancet 2009; 374: 534-42. Everlene Farrier, Doshi SK, Isa Rankin D et al. on behalf of the PROTECT AF Investigators. Percutaneous Left Atrial Appendage Closure for Stroke Prophylaxis in Patients With Atrial Fibrillation 2.3-Year Follow-up of the PROTECT AF (Watchman Left Atrial Appendage System for Embolic Protection in Patients With Atrial Fibrillation) Trial. Circulation 2013; 127:720-729. - Alli O, Doshi S,  Kar S, Reddy VY, Sievert H et al. Quality of Life Assessment in the Randomized PROTECT AF (Percutaneous Closure of the Left Atrial Appendage Versus Warfarin Therapy for Prevention of Stroke in Patients With Atrial Fibrillation) Trial of Patients  at Risk for Stroke With Nonvalvular Atrial Fibrillation. J Am Coll Cardiol 2013; 61:1790-8. Aline August DR, Mia [place], Price M, Whisenant B, Sievert H, Doshi S, Huber K, Reddy V. Prospective randomized evaluation of the Watchman left atrial appendage Device in patients with atrial fibrillation versus long-term warfarin therapy; the PREVAIL trial. Journal of the [employer] of Cardiology, Vol. 4, No. 1, 2014, 1-11. - Kar S, Doshi SK, Sadhu A, Horton R, Osorio J et al. Primary outcome evaluation of a next-generation left atrial appendage closure device: results from the PINNACLE FLX trial. Circulation 2021;143(18)1754-1762.      After today's visit with the patient which was dedicated solely for shared decision making visit regarding LAA closure device, the patient decided to proceed with the LAA appendage closure procedure scheduled to be done in the near future at [facility]. Prior to the procedure, I would like to obtain a gated CT scan of the chest with contrast timed for PV/LA visualization.  HAS-BLED score 3 Hypertension Yes  Abnormal renal and liver function (Dialysis, transplant, Cr >2.26 mg/dL /Cirrhosis or Bilirubin >2x Normal or AST/ALT/AP >3x Normal) No  Stroke No  Bleeding Yes  Labile INR (Unstable/high INR) No  Elderly (>65) Yes  Drugs or alcohol (? 8 drinks/week, anti-plt or NSAID) No    CHA2DS2-VASc Score = 5  The patient's score is based upon: CHF History: 1 HTN History: 1 Diabetes History: 0 Stroke History: 0 Vascular Disease History: 1 Age Score: 2 Gender Score: 0     Presents for LAAO today. Procedure reviewed.        Signed, Rossie Muskrat. Lalla Brothers, MD, [facility], [facility] 09/24/2022 Electrophysiology [facility]

## 2022-09-24 NOTE — Discharge Summary (Signed)
[facility]    Patient ID: Theodore [name],  MRN: [id number], DOB/AGE: 1934/12/27 88 y.o.  Admit date: 09/24/2022 Discharge date: 09/25/2022  Primary Care Physician: Eustaquio Boyden, MD  Primary Cardiologist: Chilton Si, MD  Electrophysiologist: Regan Lemming, MD  Primary Discharge Diagnosis:  Paroxysmal Atrial Fibrillation Poor candidacy for long term anticoagulation due to frequent nosebleeds  CONCLUSIONS:  1.Successful implantation of a WATCHMAN left atrial appendage occlusive device    2. TEE demonstrating no LAA thrombus 3. No early apparent complications.    Post Implant Anticoagulation Strategy: Continue Xarelto for 45 days after implant. After 45 days, stop Xarelto and start Plavix 75mg  PO daily to complete 6 months of post implant therapy. Plan for CT scan 60 days after implant to assess Watchman position and appendage seal.  Brief HPI: Theodore [name] is a 88 y.o. male with a history of CAD s/p PCI, HTN, HLD, neuropathy, RBBB, NICM with normalization, and atrial fibrillation s/p ablation 2017 with recent AF recurrence. He was restarted on Xarelto however has a hx of epistaxis while on anticoagulation. Given this, he was referred to Dr. Lalla Brothers for potential LAAO with Watchman.    He was seen in consultation 07/08/22 and was felt to be a good candidate to proceed. CTA 6/5 with large broccoli appendage with 2 lobes. A 31 mm Watchman FLX device is recommended. There was no thrombus noted.    Hospital Course:  The patient was admitted and underwent left atrial appendage occlusive device placement with 27mm Watchman FLX Pro device. He was monitored overnight  and has done very well with no concerns. Groin site has been stable without evidence of hematoma or bleeding. Wound care and restrictions were reviewed with the patient. The patient has been scheduled for post procedure follow up with Georgie Chard, NP in approximately 1 month. Medication plan will  be to restart Xarelto this evening and continue for 45 days (9/28). At that time he will stop Xarelto and start Plavix 75mg  through 6 months of therapy (03/27/23). He will require 6 months of dental SBE and should refrain from dental procedures or cleanings for the next 30 days. SBE to be RX'd at follow up. Will need to cover with Azithromycin given PCN allergy. A repeat CT will be performed in approximately 60 days to ensure proper seal of the device.   Hyperkalemia: K+ noted to be slightly above baseline however will be restarting home medications including Lasix. Plan to recheck at follow up.   Physical Exam: Vitals:   09/24/22 2018 09/24/22 2340 09/25/22 0540 09/25/22 0756  BP: (!) 91/53 (!) 82/53 (!) 101/57 (!) 87/64  Pulse: 69 68  60  Resp: 15 15  14   Temp: 98.7 F (37.1 C) 98.1 F (36.7 C) 97.7 F (36.5 C) 97.8 F (36.6 C)  TempSrc: Oral Oral Oral Oral  SpO2: 92% 91%  94%  Weight:      Height:       General: Well developed, well nourished, NAD Lungs:Clear to ausculation bilaterally. No wheezes, rales, or rhonchi. Breathing is unlabored. Cardiovascular: RRR with S1 S2. No murmurs Extremities: No edema.  Neuro: Alert and oriented. No focal deficits. No facial asymmetry. MAE spontaneously. Psych: Responds to questions appropriately with normal affect.    Labs:   Lab Results  Component Value Date   WBC 8.5 09/08/2022   HGB 16.4 09/08/2022   HCT 48.1 09/08/2022   MCV 103 (H) 09/08/2022   PLT 135 (L) 09/08/2022  Recent Labs  Lab 09/25/22 0641  NA 131*  K 5.4*  CL 100  CO2 24  BUN 24*  CREATININE 1.25*  CALCIUM 8.9  GLUCOSE 139*   Discharge Medications:  Allergies as of 09/25/2022       Reactions   Penicillins Anaphylaxis, Rash, Other (See Comments)   "Blistering rash" Has patient had a PCN reaction causing immediate rash, facial/tongue/throat swelling, SOB or lightheadedness with hypotension: Yes Has patient had a PCN reaction causing severe rash involving  mucus membranes or skin necrosis: No Has patient had a PCN reaction that required hospitalization: No Has patient had a PCN reaction occurring within the last 10 years: No If all of the above answers are "NO", then may proceed with Cephalosporin use.   Amlodipine Swelling, Other (See Comments)   Peripheral edema   Effexor [venlafaxine] Other (See Comments)   Tremors/shaking   Lisinopril Cough   Mevacor [lovastatin] Other (See Comments)   Caused cataracts and elevated liver enzymes   Vicodin [hydrocodone-acetaminophen] Nausea And Vomiting   Zocor [simvastatin] Other (See Comments)   LFT increase with simvastatin and lovastatin   Tamiflu [oseltamivir Phosphate] Itching, Rash        Medication List     STOP taking these medications    amoxicillin 500 MG tablet Commonly known as: AMOXIL       TAKE these medications    acetaminophen 500 MG tablet Commonly known as: TYLENOL Take 1 tablet (500 mg total) by mouth 3 (three) times daily as needed.   ascorbic acid 500 MG tablet Commonly known as: VITAMIN C Take 500 mg by mouth daily.   atorvastatin 40 MG tablet Commonly known as: LIPITOR Take 1 tablet (40 mg total) by mouth daily.   Breztri Aerosphere 160-9-4.8 MCG/ACT Aero Generic drug: Budeson-Glycopyrrol-Formoterol Inhale 2 puffs into the lungs 2 (two) times daily.   cyanocobalamin 500 MCG tablet Commonly known as: VITAMIN B12 Take 1 tablet (500 mcg total) by mouth every other day. What changed: when to take this   Entresto 97-103 MG Generic drug: sacubitril-valsartan Take 0.5 tablets by mouth 2 (two) times daily.   ezetimibe 10 MG tablet Commonly known as: ZETIA TAKE 1 TABLET BY MOUTH DAILY   Farxiga 10 MG Tabs tablet Generic drug: dapagliflozin propanediol TAKE 1 TABLET BY MOUTH DAILY  BEFORE BREAKFAST   furosemide 20 MG tablet Commonly known as: LASIX Take 1 tablet (20 mg total) by mouth daily.   gabapentin 300 MG capsule Commonly known as:  NEURONTIN Take 900 mg by mouth at bedtime.   ipratropium-albuterol 0.5-2.5 (3) MG/3ML Soln Commonly known as: DUONEB 1 neb every 6 hours as needed   metoprolol succinate 100 MG 24 hr tablet Commonly known as: TOPROL-XL TAKE 1 TABLET BY MOUTH DAILY  WITH OR IMMEDIATELY FOLLOWING A  MEAL   pantoprazole 40 MG tablet Commonly known as: PROTONIX Take 40 mg by mouth daily.   potassium chloride 10 MEQ tablet Commonly known as: KLOR-CON M Take 1 tablet (10 mEq total) by mouth daily.   spironolactone 25 MG tablet Commonly known as: ALDACTONE Take 0.5 tablets (12.5 mg total) by mouth daily.   traZODone 50 MG tablet Commonly known as: DESYREL TAKE 1/2 TO 1 TABLET BY MOUTH AT BEDTIME AS NEEDED FOR SLEEP   Vitamin D3 50 MCG (2000 UT) Tabs Take 2,000 Units by mouth daily.   Xarelto 20 MG Tabs tablet Generic drug: rivaroxaban TAKE 1 TABLET BY MOUTH DAILY  WITH SUPPER  Disposition:  Home  Discharge Instructions     Call MD for:  difficulty breathing, headache or visual disturbances   Complete by: As directed    Call MD for:  extreme fatigue   Complete by: As directed    Call MD for:  hives   Complete by: As directed    Call MD for:  persistant dizziness or light-headedness   Complete by: As directed    Call MD for:  persistant nausea and vomiting   Complete by: As directed    Call MD for:  redness, tenderness, or signs of infection (pain, swelling, redness, odor or green/yellow discharge around incision site)   Complete by: As directed    Call MD for:  severe uncontrolled pain   Complete by: As directed    Call MD for:  temperature >100.4   Complete by: As directed    Diet - low sodium heart healthy   Complete by: As directed    Discharge instructions   Complete by: As directed    [facility] Procedure, Care After  Procedure MD: Dr. Isidoro Donning Clinical Coordinator: Karsten Fells, RN  This sheet gives you information about how to care for yourself after your  procedure. Your health care provider may also give you more specific instructions. If you have problems or questions, contact your health care provider.  What can I expect after the procedure? After the procedure, it is common to have: Bruising around your puncture site. Tenderness around your puncture site. Tiredness (fatigue).  Medication instructions It is very important to continue to take your blood thinner as directed by your doctor after the Watchman procedure. Call your procedure doctor's office with question or concerns. If you are on Coumadin (warfarin), you will have your INR checked the week after your procedure, with a goal INR of 2.0 - 3.0. Please follow your medication instructions on your discharge summary. Only take the medications listed on your discharge paperwork.  Follow up You will be seen in 1 month after your procedure You will have a repeat CT scan approximately 8 weeks after your procedure mark to check your device You will follow up the MD/APP who performed your procedure 6 months after your procedure The Watchman Clinical Coordinator will check in with you from time to time, including 1 and 2 years after your procedure.    Follow these instructions at home: Puncture site care  Follow instructions from your health care provider about how to take care of your puncture site. Make sure you: If present, leave stitches (sutures), skin glue, or adhesive strips in place.  If a large square bandage is present, this may be removed 24 hours after surgery.  Check your puncture site every day for signs of infection. Check for: Redness, swelling, or pain. Fluid or blood. If your puncture site starts to bleed, lie down on your back, apply firm pressure to the area, and contact your health care provider. Warmth. Pus or a bad smell. Driving Do not drive yourself home if you received sedation Do not drive for at least 4 days after your procedure or however long your health  care provider recommends. (Do not resume driving if you have previously been instructed not to drive for other health reasons.) Do not spend greater than 1 hour at a time in a car for the first 3 days. Stop and take a break with a 5 minute walk at least every hour.  Do not drive or use heavy machinery while taking prescription  pain medicine.  Activity Avoid activities that take a lot of effort, including exercise, for at least 7 days after your procedure. For the first 3 days, avoid sitting for longer than one hour at a time.  Avoid alcoholic beverages, signing paperwork, or participating in legal proceedings for 24 hours after receiving sedation Do not lift anything that is heavier than 10 lb (4.5 kg) for one week.  No sexual activity for 1 week.  Return to your normal activities as told by your health care provider. Ask your health care provider what activities are safe for you. General instructions Take over-the-counter and prescription medicines only as told by your health care provider. Do not use any products that contain nicotine or tobacco, such as cigarettes and e-cigarettes. If you need help quitting, ask your health care provider. You may shower after 24 hours, but Do not take baths, swim, or use a hot tub for 1 week.  Do not drink alcohol for 24 hours after your procedure. Keep all follow-up visits as told by your health care provider. This is important. Dental Work: You will require antibiotics prior to any dental work, including cleanings, for 6 months after your Watchman implantation to help protect you from infection. After 6 months, antibiotics are no longer required. Contact a health care provider if: You have redness, mild swelling, or pain around your puncture site. You have soreness in your throat or at your puncture site that does not improve after several days You have fluid or blood coming from your puncture site that stops after applying firm pressure to the area. Your  puncture site feels warm to the touch. You have pus or a bad smell coming from your puncture site. You have a fever. You have chest pain or discomfort that spreads to your neck, jaw, or arm. You are sweating a lot. You feel nauseous. You have a fast or irregular heartbeat. You have shortness of breath. You are dizzy or light-headed and feel the need to lie down. You have pain or numbness in the arm or leg closest to your puncture site. Get help right away if: Your puncture site suddenly swells. Your puncture site is bleeding and the bleeding does not stop after applying firm pressure to the area. These symptoms may represent a serious problem that is an emergency. Do not wait to see if the symptoms will go away. Get medical help right away. Call your local emergency services (911 in the [place] Do not drive yourself to the hospital. Summary After the procedure, it is normal to have bruising and tenderness at the puncture site in your groin, neck, or forearm. Check your puncture site every day for signs of infection. Get help right away if your puncture site is bleeding and the bleeding does not stop after applying firm pressure to the area. This is a medical emergency.  This information is not intended to replace advice given to you by your health care provider. Make sure you discuss any questions you have with your health care provider.   Increase activity slowly   Complete by: As directed        Follow-up Information     Filbert Schilder, NP Follow up on 10/27/2022.   Specialty: Cardiology Why: @ 9:30am. Please arrive at 9:15am Contact information: [address] [place] [address] [phone]                Duration of Discharge Encounter: Greater than 30 minutes including physician  time.  Signed, Georgie Chard, NP  09/25/2022 9:45 AM

## 2022-09-24 NOTE — Transfer of Care (Signed)
Immediate Anesthesia Transfer of Care Note  Patient: CICERO WICKES  Procedure(s) Performed: LEFT ATRIAL APPENDAGE OCCLUSION TRANSESOPHAGEAL ECHOCARDIOGRAM  Patient Location: Cath Lab  Anesthesia Type:General  Level of Consciousness: drowsy and patient cooperative  Airway & Oxygen Therapy: Patient Spontanous Breathing and Patient connected to nasal cannula oxygen  Post-op Assessment: Report given to RN, Post -op Vital signs reviewed and stable, and Patient moving all extremities X 4  Post vital signs: Reviewed and stable  Last Vitals:  Vitals Value Taken Time  BP 118/58 09/24/22 1141  Temp    Pulse 67 09/24/22 1143  Resp 17 09/24/22 1143  SpO2 97 % 09/24/22 1143  Vitals shown include unfiled device data.  Last Pain:  Vitals:   09/24/22 0836  TempSrc:   PainSc: 0-No pain      Patients Stated Pain Goal: 0 (09/24/22 0836)  Complications: There were no known notable events for this encounter.

## 2022-09-24 NOTE — Plan of Care (Signed)

## 2022-09-25 ENCOUNTER — Encounter (HOSPITAL_COMMUNITY): Payer: Self-pay | Admitting: Cardiology

## 2022-09-25 LAB — BASIC METABOLIC PANEL
Anion gap: 7 (ref 5–15)
BUN: 24 mg/dL — ABNORMAL HIGH (ref 8–23)
CO2: 24 mmol/L (ref 22–32)
Calcium: 8.9 mg/dL (ref 8.9–10.3)
Chloride: 100 mmol/L (ref 98–111)
Creatinine, Ser: 1.25 mg/dL — ABNORMAL HIGH (ref 0.61–1.24)
GFR, Estimated: 56 mL/min — ABNORMAL LOW (ref >60)
Glucose, Bld: 139 mg/dL — ABNORMAL HIGH (ref 70–99)
Potassium: 5.4 mmol/L — ABNORMAL HIGH (ref 3.5–5.1)
Sodium: 131 mmol/L — ABNORMAL LOW (ref 135–145)

## 2022-09-25 LAB — POCT ACTIVATED CLOTTING TIME
Activated Clotting Time: 483 s
Activated Clotting Time: 910 s

## 2022-09-25 MED FILL — Fentanyl Citrate Preservative Free (PF) Inj 100 MCG/2ML: INTRAMUSCULAR | Qty: 0.5 | Status: AC

## 2022-09-25 NOTE — Progress Notes (Signed)
Patient provided with verbal discharge instructions. Paper copy of discharge provided to patient. RN answered all questions. VSS at discharge. IV's removed. Patient belongings sent with patient. Patient dc'd via wheelchair to private vehicle.

## 2022-09-25 NOTE — Discharge Instructions (Signed)

## 2022-09-25 NOTE — Plan of Care (Signed)

## 2022-09-26 ENCOUNTER — Telehealth: Payer: Self-pay | Admitting: Cardiology

## 2022-09-26 NOTE — Telephone Encounter (Signed)
  [facility]   Watchman Team  Contacted the patient regarding discharge from [facility] on 09/25/22   The patient understands to follow up with Georgie Chard NP on 9/16  The patient understands discharge instructions? Yes  The patient understands medications and regimen? Yes   The patient reports groin site looks stable with no bleeding   The patient understands to call with any questions or concerns prior to scheduled visit.

## 2022-09-29 ENCOUNTER — Telehealth: Payer: Self-pay

## 2022-09-29 NOTE — Transitions of Care (Post Inpatient/ED Visit) (Signed)
09/29/2022  Name: Theodore [name] MRN: [id number] DOB: Nov 11, 1934  Today's TOC FU Call Status: Today's TOC FU Call Status:: Successful TOC FU Call Completed TOC FU Call Complete Date: 09/29/22  Transition Care Management Follow-up Telephone Call Date of Discharge: 09/25/22 Discharge Facility: [facility]) Type of Discharge: Inpatient Admission Primary Inpatient Discharge Diagnosis:: Paroxysmal Atrial Fibrillation How have you been since you were released from the hospital?: Better Any questions or concerns?: No  Items Reviewed: Did you receive and understand the discharge instructions provided?: Yes Medications obtained,verified, and reconciled?: Yes (Medications Reviewed) Any new allergies since your discharge?: No Dietary orders reviewed?: Yes Do you have support at home?: Yes  Medications Reviewed Today: Medications Reviewed Today     Reviewed by Merleen Nicely, LPN (Licensed Practical Nurse) on 09/29/22 at 1131  Med List Status: <None>   Medication Order Taking? Sig Documenting Provider Last Dose Status Informant  acetaminophen (TYLENOL) 500 MG tablet [id number] Yes Take 1 tablet (500 mg total) by mouth 3 (three) times daily as needed. Eustaquio Boyden, MD Taking Active Self  atorvastatin (LIPITOR) 40 MG tablet [id number] Yes Take 1 tablet (40 mg total) by mouth daily. Alver Sorrow, NP Taking Active Self  Budeson-Glycopyrrol-Formoterol (BREZTRI AEROSPHERE) 160-9-4.8 MCG/ACT Sandrea Matte [id number] Yes Inhale 2 puffs into the lungs 2 (two) times daily. Waymon Budge, MD Taking Active Self  Cholecalciferol (VITAMIN D3) 2000 units TABS [id number] Yes Take 2,000 Units by mouth daily. [provider] Taking Active Self  cyanocobalamin (VITAMIN B12) 500 MCG tablet [id number] Yes Take 1 tablet (500 mcg total) by mouth every other day.  Patient taking differently: Take 500 mcg by mouth every Monday, Wednesday, and Friday.   Eustaquio Boyden, MD Taking Active Self   ezetimibe (ZETIA) 10 MG tablet [id number] Yes TAKE 1 TABLET BY MOUTH DAILY Chilton Si, MD Taking Active Self  FARXIGA 10 MG TABS tablet [id number] Yes TAKE 1 TABLET BY MOUTH DAILY  BEFORE Yvette Rack, MD Taking Active Self  furosemide (LASIX) 20 MG tablet [id number] Yes Take 1 tablet (20 mg total) by mouth daily. Sheilah Pigeon, PA-C Taking Active Self  gabapentin (NEURONTIN) 300 MG capsule [id number] Yes Take 900 mg by mouth at bedtime. [provider] Taking Active Self  ipratropium-albuterol (DUONEB) 0.5-2.5 (3) MG/3ML SOLN [id number] Yes 1 neb every 6 hours as needed Jetty Duhamel D, MD Taking Active Self  metoprolol succinate (TOPROL-XL) 100 MG 24 hr tablet [id number] Yes TAKE 1 TABLET BY MOUTH DAILY  WITH OR IMMEDIATELY FOLLOWING A  MEAL Chilton Si, MD Taking Active Self  pantoprazole (PROTONIX) 40 MG tablet [id number] Yes Take 40 mg by mouth daily. Eustaquio Boyden, MD Taking Active Self  potassium chloride (KLOR-CON M) 10 MEQ tablet [id number] Yes Take 1 tablet (10 mEq total) by mouth daily. Alver Sorrow, NP Taking Active Self           Med Note Lenoria Farrier   Tue Sep 16, 2022  3:37 PM) evening  rivaroxaban (XARELTO) 20 MG TABS tablet [id number] Yes TAKE 1 TABLET BY MOUTH DAILY  WITH SUPPER Lanier Prude, MD Taking Active Self  sacubitril-valsartan (ENTRESTO) 97-103 MG [id number] Yes Take 0.5 tablets by mouth 2 (two) times daily. Alver Sorrow, NP Taking Active Self  spironolactone (ALDACTONE) 25 MG tablet [id number] Yes Take 0.5 tablets (12.5 mg total) by mouth daily. Alver Sorrow, NP Taking Active Self  traZODone (DESYREL) 50 MG tablet [id number] Yes TAKE 1/2 TO 1 TABLET BY MOUTH AT BEDTIME  AS NEEDED FOR SLEEP Eustaquio Boyden, MD Taking Active Self  vitamin C (ASCORBIC ACID) 500 MG tablet [id number] Yes Take 500 mg by mouth daily. [provider] Taking Active Self            Home Care and Equipment/Supplies: Were  Home Health Services Ordered?: No Any new equipment or medical supplies ordered?: No  Functional Questionnaire: Do you need assistance with bathing/showering or dressing?: No Do you need assistance with meal preparation?: No Do you need assistance with eating?: No Do you have difficulty maintaining continence: No Do you need assistance with getting out of bed/getting out of a chair/moving?: No Do you have difficulty managing or taking your medications?: No  Follow up appointments reviewed: PCP Follow-up appointment confirmed?: No MD Provider Line Number:[phone] Given: Yes Specialist Hospital Follow-up appointment confirmed?: Yes Date of Specialist follow-up appointment?: 10/24/22 Follow-Up Specialty Provider:: Gillian Shields Do you need transportation to your follow-up appointment?: No Do you understand care options if your condition(s) worsen?: Yes-patient verbalized understanding    SIGNATURE  Woodfin Ganja LPN [facility] Nurse Health Advisor Direct Dial [phone]

## 2022-10-01 ENCOUNTER — Encounter: Payer: Self-pay | Admitting: Internal Medicine

## 2022-10-01 NOTE — Assessment & Plan Note (Signed)
Pending Watchman. Managed by Cardiology

## 2022-10-01 NOTE — Assessment & Plan Note (Signed)
COPD with bronchiectasis Plan-continue current meds.  Call for refills when needed.

## 2022-10-21 ENCOUNTER — Ambulatory Visit (HOSPITAL_BASED_OUTPATIENT_CLINIC_OR_DEPARTMENT_OTHER): Payer: Medicare Other | Admitting: Family

## 2022-10-21 ENCOUNTER — Ambulatory Visit (HOSPITAL_COMMUNITY): Payer: Medicare Other | Admitting: Physician Assistant

## 2022-10-22 NOTE — Progress Notes (Signed)
[facility]                                     Cardiology Office Note:    Date:  10/28/2022   ID:  Theodore [name], DOB 06-09-1934, MRN [id number]  PCP:  Eustaquio Boyden, MD  [facility] Cardiologist:  Chilton Si, MD  [facility] Electrophysiologist:  Will Jorja Loa, MD   Referring MD: Eustaquio Boyden, MD   Chief Complaint  Patient presents with   Follow-up    1 month s/p LAAO    History of Present Illness:    CHADRON DUEL is a 88 y.o. male with a hx of CAD s/p PCI, HTN, HLD, neuropathy, RBBB, NICM with normalization, and atrial fibrillation s/p ablation 2017 with recent AF recurrence. He was restarted on Xarelto however has a hx of epistaxis while on anticoagulation. Given this, he was referred to Dr. Lalla Brothers for potential LAAO with Watchman.    Mr. Winterrowd was seen in consultation 07/08/22 and was felt to be a good candidate to proceed. CTA 6/5 with anatomy suitable to proceed. She is now s/p LAAO with Watchman 27mm FLX Pro device 09/24/22. He was restarted on home Xarelto the evening of his procedure with plans to continue for 45 days (9/28). At that time he will stop Xarelto and start Plavix 75mg  through 6 months of therapy (03/27/23). He will require 6 months of dental SBE with Azithromycin given PCN allergy.   Today he is here and reports he has been well since his procedure. He denies chest pain, SOB, palpitations, LE edema, orthopnea, PND, dizziness, or syncope. Denies bleeding in stool or urine.     Hyperkalemia: K+ noted to be slightly above baseline however will be restarting home medications including Lasix. Plan to recheck at follow up.      Past Medical History:  Diagnosis Date   Atypical atrial flutter (HCC) 12/31/2021   CAP (community acquired pneumonia) 01/19/2018   History of arthritis    History of rheumatic fever    Hypercholesterolemia    Hypertension    Nocturia    PAF (paroxysmal atrial fibrillation) (HCC) 02/2011    Placed on Pradaxa. Did not require cardioversion; spontaneously converted   Peripheral edema    Persistent atrial fibrillation (HCC) 01/21/2011   S/p ablation 12/2015 NSR up until his PCI Dec 2020 when he went into AF post PCI   Presence of Watchman left atrial appendage closure device 09/24/2022   27mm Watchman FLX Pro placed by Dr. 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   at least in situ   Tendonitis of elbow, left     Past Surgical History:  Procedure Laterality Date   ATRIAL FIBRILLATION ABLATION  12/19/2015   BACK SURGERY  12/24/09   fusion C3-C4   BACK SURGERY  2010   fusion L4-L5 Wynetta Emery)   CARDIAC CATHETERIZATION  2009   NONOBSTRUCTIVE ATHERSCLEROTIC CORONARY DISEASE AND NORMAL  LV FUNCTION   CARDIAC CATHETERIZATION N/A 09/08/2014   Procedure: Left Heart Cath and Coronary Angiography;  Surgeon: Lyn Records, MD;  Location: [facility] INVASIVE CV LAB;  Service: Cardiovascular;  Laterality: N/A;   CARDIAC CATHETERIZATION N/A 09/28/2015   Procedure: Left Heart Cath and Coronary Angiography;  Surgeon: Marykay Lex, MD;  Location: [facility] INVASIVE CV LAB;  Service: Cardiovascular;  cavity size is mildly enlarged. End systolic cavity size is mildly enlarged.   Prior study available for comparison from 08/11/2014.  Abnormal, intermediate risk  stress nuclear study with inferior apical thinning versus prior infarct but no ischemia; gated ejection fraction 43% with global hypokinesis and mild left ventricular enlargement.  Study interpreted as intermediate risk due to reduced LV function.  Suggest echocardiogram to better assess LV function.   ECHOCARDIOGRAM  ECHOCARDIOGRAM COMPLETE 05/13/2022  Narrative ECHOCARDIOGRAM REPORT    Patient Name:   PERRIE THAIN Date of Exam: 05/13/2022 Medical Rec #:  [id number]       Height:       71.0 in Accession #:    [id number]      Weight:       194.4 lb Date of Birth:  1934-07-30        BSA:          2.083 m Patient Age:    87 years        BP:           110/60 mmHg Patient Gender: M               HR:           61 bpm. Exam Location:  Outpatient  Procedure: 3D Echo, 2D Echo, Cardiac Doppler, Color Doppler and Strain Analysis  Indications:    CHF  History:        Patient has prior history of Echocardiogram examinations, most recent 03/03/2022. CAD, COPD, Arrythmias:RBBB, PVC and Atrial Flutter; Risk Factors:Hypertension, Dyslipidemia and Non-Smoker.  Sonographer:    Jeryl Columbia RDCS Referring Phys: [id number] CAITLIN S WALKER  IMPRESSIONS   1. Left ventricular ejection fraction, by estimation, is 30 to 35%. Left ventricular ejection fraction by 3D volume is 35 %. The left ventricle has moderately decreased function. The left ventricle demonstrates global hypokinesis. The left ventricular internal cavity size was mildly dilated. There is mild left ventricular hypertrophy. Left ventricular diastolic parameters are indeterminate. 2. Right ventricular systolic function is low normal. The right ventricular size is mildly enlarged. 3. Left atrial size was mildly dilated. 4. Right atrial size was moderately dilated. 5. The mitral valve is abnormal. Trivial mitral valve regurgitation. 6. The aortic valve is tricuspid. Aortic valve regurgitation is mild. Aortic valve sclerosis/calcification is  present, without any evidence of aortic stenosis. Aortic valve mean gradient measures 8.0 mmHg. 7. The inferior vena cava is normal in size with greater than 50% respiratory variability, suggesting right atrial pressure of 3 mmHg. 8. Rhythm strip during this exam demonstrates frequent PVC's.  Comparison(s): No significant change from prior study. 03/03/2022: LVEF 30-35%, global hypokinesis.  FINDINGS Left Ventricle: Left ventricular ejection fraction, by estimation, is 30 to 35%. Left ventricular ejection fraction by 3D volume is 35 %. The left ventricle has moderately decreased function. The left ventricle demonstrates global hypokinesis. The left ventricular internal cavity size was mildly dilated. There is mild left ventricular hypertrophy. Left ventricular diastolic parameters are indeterminate.  Right Ventricle: The right ventricular size is mildly enlarged. No increase in right ventricular wall thickness. Right ventricular systolic function is low normal.  Left Atrium: Left atrial size was mildly dilated.  Right Atrium: Right atrial size was moderately dilated.  Pericardium: There is no evidence of pericardial effusion.  Mitral Valve: The mitral valve is abnormal. There is mild thickening of the anterior and posterior mitral valve leaflet(s). Mild mitral annular calcification. Trivial mitral valve regurgitation.  Tricuspid Valve: The tricuspid valve is  Peak grad: 26.8 mmHg    Systemic Diam: 2.00 cm MR Vmax:      259.00 cm/s MV E velocity: 35.10 cm/s MV A velocity: 47.70 cm/s MV E/A ratio:  0.74  Zoila Shutter MD Electronically signed by Zoila Shutter MD Signature Date/Time: 05/13/2022/3:26:51  PM    Final   TEE  ECHO TEE 09/24/2022  Narrative TRANSESOPHOGEAL ECHO REPORT    Patient Name:   Theodore [name] Date of Exam: 09/24/2022 Medical Rec #:  [id number]       Height:       70.0 in Accession #:    [id number]      Weight:       198.6 lb Date of Birth:  [birth year]        BSA:          2.081 m Patient Age:    88 years        BP:           100/68 mmHg Patient Gender: M               HR:           74 bpm. Exam Location:  Inpatient  Procedure: TEE-Intraopertive  Indications:    Watchman  History:        Patient has prior history of Echocardiogram examinations. Arrythmias:Atrial Fibrillation.  Sonographer:    Darlys Gales Referring Phys: [id number] Lanier Prude  PROCEDURE: The transesophogeal probe was passed without difficulty through the esophogus of the patient. Sedation performed by different physician. The patient's vital signs; including heart rate, blood pressure, and oxygen saturation; remained stable throughout the procedure. The patient developed no complications during the procedure.  IMPRESSIONS   1. Brocolli appendage with no thrombus. Well positioned 27 mm Watchman FLX device with 1/3 mitral shoulder. No leak by color flow. Negative tugg test. Average compression 16%. 2. Post trans septal puncture only left to right shunting. 3. Left ventricular ejection fraction, by estimation, is 35%. The left ventricle has normal function. The left ventricle demonstrates global hypokinesis. The left ventricular internal cavity size was moderately dilated. 4. Right ventricular systolic function is mildly reduced. The right ventricular size is mildly enlarged. 5. Left atrial size was mildly dilated. No left atrial/left atrial appendage thrombus was detected. 6. Right atrial size was moderately dilated. 7. A small pericardial effusion is present. The pericardial effusion is posterior to the left ventricle. 8. The mitral valve is normal in structure. Trivial mitral valve  regurgitation. No evidence of mitral stenosis. 9. The aortic valve is tricuspid. There is moderate calcification of the aortic valve. There is moderate thickening of the aortic valve. Aortic valve regurgitation is moderate. Mild aortic valve stenosis. 10. The inferior vena cava is normal in size with greater than 50% respiratory variability, suggesting right atrial pressure of 3 mmHg.  Conclusion(s)/Recommendation(s): Normal biventricular function without evidence of hemodynamically significant valvular heart disease.  FINDINGS Left Ventricle: Left ventricular ejection fraction, by estimation, is 35%. The left ventricle has normal function. The left ventricle demonstrates global hypokinesis. The left ventricular internal cavity size was moderately dilated. There is no left ventricular hypertrophy.  Right Ventricle: The right ventricular size is mildly enlarged. No increase in right ventricular wall thickness. Right ventricular systolic function is mildly reduced.  Left Atrium: Left atrial size was mildly dilated. No left atrial/left atrial appendage thrombus was detected.  Right Atrium: Right atrial size was moderately dilated.  Pericardium: A small pericardial effusion is present. The pericardial effusion is  cavity size is mildly enlarged. End systolic cavity size is mildly enlarged.   Prior study available for comparison from 08/11/2014.  Abnormal, intermediate risk  stress nuclear study with inferior apical thinning versus prior infarct but no ischemia; gated ejection fraction 43% with global hypokinesis and mild left ventricular enlargement.  Study interpreted as intermediate risk due to reduced LV function.  Suggest echocardiogram to better assess LV function.   ECHOCARDIOGRAM  ECHOCARDIOGRAM COMPLETE 05/13/2022  Narrative ECHOCARDIOGRAM REPORT    Patient Name:   PERRIE THAIN Date of Exam: 05/13/2022 Medical Rec #:  [id number]       Height:       71.0 in Accession #:    [id number]      Weight:       194.4 lb Date of Birth:  1934-07-30        BSA:          2.083 m Patient Age:    87 years        BP:           110/60 mmHg Patient Gender: M               HR:           61 bpm. Exam Location:  Outpatient  Procedure: 3D Echo, 2D Echo, Cardiac Doppler, Color Doppler and Strain Analysis  Indications:    CHF  History:        Patient has prior history of Echocardiogram examinations, most recent 03/03/2022. CAD, COPD, Arrythmias:RBBB, PVC and Atrial Flutter; Risk Factors:Hypertension, Dyslipidemia and Non-Smoker.  Sonographer:    Jeryl Columbia RDCS Referring Phys: [id number] CAITLIN S WALKER  IMPRESSIONS   1. Left ventricular ejection fraction, by estimation, is 30 to 35%. Left ventricular ejection fraction by 3D volume is 35 %. The left ventricle has moderately decreased function. The left ventricle demonstrates global hypokinesis. The left ventricular internal cavity size was mildly dilated. There is mild left ventricular hypertrophy. Left ventricular diastolic parameters are indeterminate. 2. Right ventricular systolic function is low normal. The right ventricular size is mildly enlarged. 3. Left atrial size was mildly dilated. 4. Right atrial size was moderately dilated. 5. The mitral valve is abnormal. Trivial mitral valve regurgitation. 6. The aortic valve is tricuspid. Aortic valve regurgitation is mild. Aortic valve sclerosis/calcification is  present, without any evidence of aortic stenosis. Aortic valve mean gradient measures 8.0 mmHg. 7. The inferior vena cava is normal in size with greater than 50% respiratory variability, suggesting right atrial pressure of 3 mmHg. 8. Rhythm strip during this exam demonstrates frequent PVC's.  Comparison(s): No significant change from prior study. 03/03/2022: LVEF 30-35%, global hypokinesis.  FINDINGS Left Ventricle: Left ventricular ejection fraction, by estimation, is 30 to 35%. Left ventricular ejection fraction by 3D volume is 35 %. The left ventricle has moderately decreased function. The left ventricle demonstrates global hypokinesis. The left ventricular internal cavity size was mildly dilated. There is mild left ventricular hypertrophy. Left ventricular diastolic parameters are indeterminate.  Right Ventricle: The right ventricular size is mildly enlarged. No increase in right ventricular wall thickness. Right ventricular systolic function is low normal.  Left Atrium: Left atrial size was mildly dilated.  Right Atrium: Right atrial size was moderately dilated.  Pericardium: There is no evidence of pericardial effusion.  Mitral Valve: The mitral valve is abnormal. There is mild thickening of the anterior and posterior mitral valve leaflet(s). Mild mitral annular calcification. Trivial mitral valve regurgitation.  Tricuspid Valve: The tricuspid valve is  Peak grad: 26.8 mmHg    Systemic Diam: 2.00 cm MR Vmax:      259.00 cm/s MV E velocity: 35.10 cm/s MV A velocity: 47.70 cm/s MV E/A ratio:  0.74  Zoila Shutter MD Electronically signed by Zoila Shutter MD Signature Date/Time: 05/13/2022/3:26:51  PM    Final   TEE  ECHO TEE 09/24/2022  Narrative TRANSESOPHOGEAL ECHO REPORT    Patient Name:   Theodore [name] Date of Exam: 09/24/2022 Medical Rec #:  [id number]       Height:       70.0 in Accession #:    [id number]      Weight:       198.6 lb Date of Birth:  [birth year]        BSA:          2.081 m Patient Age:    88 years        BP:           100/68 mmHg Patient Gender: M               HR:           74 bpm. Exam Location:  Inpatient  Procedure: TEE-Intraopertive  Indications:    Watchman  History:        Patient has prior history of Echocardiogram examinations. Arrythmias:Atrial Fibrillation.  Sonographer:    Darlys Gales Referring Phys: [id number] Lanier Prude  PROCEDURE: The transesophogeal probe was passed without difficulty through the esophogus of the patient. Sedation performed by different physician. The patient's vital signs; including heart rate, blood pressure, and oxygen saturation; remained stable throughout the procedure. The patient developed no complications during the procedure.  IMPRESSIONS   1. Brocolli appendage with no thrombus. Well positioned 27 mm Watchman FLX device with 1/3 mitral shoulder. No leak by color flow. Negative tugg test. Average compression 16%. 2. Post trans septal puncture only left to right shunting. 3. Left ventricular ejection fraction, by estimation, is 35%. The left ventricle has normal function. The left ventricle demonstrates global hypokinesis. The left ventricular internal cavity size was moderately dilated. 4. Right ventricular systolic function is mildly reduced. The right ventricular size is mildly enlarged. 5. Left atrial size was mildly dilated. No left atrial/left atrial appendage thrombus was detected. 6. Right atrial size was moderately dilated. 7. A small pericardial effusion is present. The pericardial effusion is posterior to the left ventricle. 8. The mitral valve is normal in structure. Trivial mitral valve  regurgitation. No evidence of mitral stenosis. 9. The aortic valve is tricuspid. There is moderate calcification of the aortic valve. There is moderate thickening of the aortic valve. Aortic valve regurgitation is moderate. Mild aortic valve stenosis. 10. The inferior vena cava is normal in size with greater than 50% respiratory variability, suggesting right atrial pressure of 3 mmHg.  Conclusion(s)/Recommendation(s): Normal biventricular function without evidence of hemodynamically significant valvular heart disease.  FINDINGS Left Ventricle: Left ventricular ejection fraction, by estimation, is 35%. The left ventricle has normal function. The left ventricle demonstrates global hypokinesis. The left ventricular internal cavity size was moderately dilated. There is no left ventricular hypertrophy.  Right Ventricle: The right ventricular size is mildly enlarged. No increase in right ventricular wall thickness. Right ventricular systolic function is mildly reduced.  Left Atrium: Left atrial size was mildly dilated. No left atrial/left atrial appendage thrombus was detected.  Right Atrium: Right atrial size was moderately dilated.  Pericardium: A small pericardial effusion is present. The pericardial effusion is  [facility]                                     Cardiology Office Note:    Date:  10/28/2022   ID:  Theodore [name], DOB 06-09-1934, MRN [id number]  PCP:  Eustaquio Boyden, MD  [facility] Cardiologist:  Chilton Si, MD  [facility] Electrophysiologist:  Will Jorja Loa, MD   Referring MD: Eustaquio Boyden, MD   Chief Complaint  Patient presents with   Follow-up    1 month s/p LAAO    History of Present Illness:    CHADRON DUEL is a 88 y.o. male with a hx of CAD s/p PCI, HTN, HLD, neuropathy, RBBB, NICM with normalization, and atrial fibrillation s/p ablation 2017 with recent AF recurrence. He was restarted on Xarelto however has a hx of epistaxis while on anticoagulation. Given this, he was referred to Dr. Lalla Brothers for potential LAAO with Watchman.    Mr. Winterrowd was seen in consultation 07/08/22 and was felt to be a good candidate to proceed. CTA 6/5 with anatomy suitable to proceed. She is now s/p LAAO with Watchman 27mm FLX Pro device 09/24/22. He was restarted on home Xarelto the evening of his procedure with plans to continue for 45 days (9/28). At that time he will stop Xarelto and start Plavix 75mg  through 6 months of therapy (03/27/23). He will require 6 months of dental SBE with Azithromycin given PCN allergy.   Today he is here and reports he has been well since his procedure. He denies chest pain, SOB, palpitations, LE edema, orthopnea, PND, dizziness, or syncope. Denies bleeding in stool or urine.     Hyperkalemia: K+ noted to be slightly above baseline however will be restarting home medications including Lasix. Plan to recheck at follow up.      Past Medical History:  Diagnosis Date   Atypical atrial flutter (HCC) 12/31/2021   CAP (community acquired pneumonia) 01/19/2018   History of arthritis    History of rheumatic fever    Hypercholesterolemia    Hypertension    Nocturia    PAF (paroxysmal atrial fibrillation) (HCC) 02/2011    Placed on Pradaxa. Did not require cardioversion; spontaneously converted   Peripheral edema    Persistent atrial fibrillation (HCC) 01/21/2011   S/p ablation 12/2015 NSR up until his PCI Dec 2020 when he went into AF post PCI   Presence of Watchman left atrial appendage closure device 09/24/2022   27mm Watchman FLX Pro placed by Dr. 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   at least in situ   Tendonitis of elbow, left     Past Surgical History:  Procedure Laterality Date   ATRIAL FIBRILLATION ABLATION  12/19/2015   BACK SURGERY  12/24/09   fusion C3-C4   BACK SURGERY  2010   fusion L4-L5 Wynetta Emery)   CARDIAC CATHETERIZATION  2009   NONOBSTRUCTIVE ATHERSCLEROTIC CORONARY DISEASE AND NORMAL  LV FUNCTION   CARDIAC CATHETERIZATION N/A 09/08/2014   Procedure: Left Heart Cath and Coronary Angiography;  Surgeon: Lyn Records, MD;  Location: [facility] INVASIVE CV LAB;  Service: Cardiovascular;  Laterality: N/A;   CARDIAC CATHETERIZATION N/A 09/28/2015   Procedure: Left Heart Cath and Coronary Angiography;  Surgeon: Marykay Lex, MD;  Location: [facility] INVASIVE CV LAB;  Service: Cardiovascular;  Peak grad: 26.8 mmHg    Systemic Diam: 2.00 cm MR Vmax:      259.00 cm/s MV E velocity: 35.10 cm/s MV A velocity: 47.70 cm/s MV E/A ratio:  0.74  Zoila Shutter MD Electronically signed by Zoila Shutter MD Signature Date/Time: 05/13/2022/3:26:51  PM    Final   TEE  ECHO TEE 09/24/2022  Narrative TRANSESOPHOGEAL ECHO REPORT    Patient Name:   Theodore [name] Date of Exam: 09/24/2022 Medical Rec #:  [id number]       Height:       70.0 in Accession #:    [id number]      Weight:       198.6 lb Date of Birth:  [birth year]        BSA:          2.081 m Patient Age:    88 years        BP:           100/68 mmHg Patient Gender: M               HR:           74 bpm. Exam Location:  Inpatient  Procedure: TEE-Intraopertive  Indications:    Watchman  History:        Patient has prior history of Echocardiogram examinations. Arrythmias:Atrial Fibrillation.  Sonographer:    Darlys Gales Referring Phys: [id number] Lanier Prude  PROCEDURE: The transesophogeal probe was passed without difficulty through the esophogus of the patient. Sedation performed by different physician. The patient's vital signs; including heart rate, blood pressure, and oxygen saturation; remained stable throughout the procedure. The patient developed no complications during the procedure.  IMPRESSIONS   1. Brocolli appendage with no thrombus. Well positioned 27 mm Watchman FLX device with 1/3 mitral shoulder. No leak by color flow. Negative tugg test. Average compression 16%. 2. Post trans septal puncture only left to right shunting. 3. Left ventricular ejection fraction, by estimation, is 35%. The left ventricle has normal function. The left ventricle demonstrates global hypokinesis. The left ventricular internal cavity size was moderately dilated. 4. Right ventricular systolic function is mildly reduced. The right ventricular size is mildly enlarged. 5. Left atrial size was mildly dilated. No left atrial/left atrial appendage thrombus was detected. 6. Right atrial size was moderately dilated. 7. A small pericardial effusion is present. The pericardial effusion is posterior to the left ventricle. 8. The mitral valve is normal in structure. Trivial mitral valve  regurgitation. No evidence of mitral stenosis. 9. The aortic valve is tricuspid. There is moderate calcification of the aortic valve. There is moderate thickening of the aortic valve. Aortic valve regurgitation is moderate. Mild aortic valve stenosis. 10. The inferior vena cava is normal in size with greater than 50% respiratory variability, suggesting right atrial pressure of 3 mmHg.  Conclusion(s)/Recommendation(s): Normal biventricular function without evidence of hemodynamically significant valvular heart disease.  FINDINGS Left Ventricle: Left ventricular ejection fraction, by estimation, is 35%. The left ventricle has normal function. The left ventricle demonstrates global hypokinesis. The left ventricular internal cavity size was moderately dilated. There is no left ventricular hypertrophy.  Right Ventricle: The right ventricular size is mildly enlarged. No increase in right ventricular wall thickness. Right ventricular systolic function is mildly reduced.  Left Atrium: Left atrial size was mildly dilated. No left atrial/left atrial appendage thrombus was detected.  Right Atrium: Right atrial size was moderately dilated.  Pericardium: A small pericardial effusion is present. The pericardial effusion is  [facility]                                     Cardiology Office Note:    Date:  10/28/2022   ID:  Theodore [name], DOB 06-09-1934, MRN [id number]  PCP:  Eustaquio Boyden, MD  [facility] Cardiologist:  Chilton Si, MD  [facility] Electrophysiologist:  Will Jorja Loa, MD   Referring MD: Eustaquio Boyden, MD   Chief Complaint  Patient presents with   Follow-up    1 month s/p LAAO    History of Present Illness:    CHADRON DUEL is a 88 y.o. male with a hx of CAD s/p PCI, HTN, HLD, neuropathy, RBBB, NICM with normalization, and atrial fibrillation s/p ablation 2017 with recent AF recurrence. He was restarted on Xarelto however has a hx of epistaxis while on anticoagulation. Given this, he was referred to Dr. Lalla Brothers for potential LAAO with Watchman.    Mr. Winterrowd was seen in consultation 07/08/22 and was felt to be a good candidate to proceed. CTA 6/5 with anatomy suitable to proceed. She is now s/p LAAO with Watchman 27mm FLX Pro device 09/24/22. He was restarted on home Xarelto the evening of his procedure with plans to continue for 45 days (9/28). At that time he will stop Xarelto and start Plavix 75mg  through 6 months of therapy (03/27/23). He will require 6 months of dental SBE with Azithromycin given PCN allergy.   Today he is here and reports he has been well since his procedure. He denies chest pain, SOB, palpitations, LE edema, orthopnea, PND, dizziness, or syncope. Denies bleeding in stool or urine.     Hyperkalemia: K+ noted to be slightly above baseline however will be restarting home medications including Lasix. Plan to recheck at follow up.      Past Medical History:  Diagnosis Date   Atypical atrial flutter (HCC) 12/31/2021   CAP (community acquired pneumonia) 01/19/2018   History of arthritis    History of rheumatic fever    Hypercholesterolemia    Hypertension    Nocturia    PAF (paroxysmal atrial fibrillation) (HCC) 02/2011    Placed on Pradaxa. Did not require cardioversion; spontaneously converted   Peripheral edema    Persistent atrial fibrillation (HCC) 01/21/2011   S/p ablation 12/2015 NSR up until his PCI Dec 2020 when he went into AF post PCI   Presence of Watchman left atrial appendage closure device 09/24/2022   27mm Watchman FLX Pro placed by Dr. 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   at least in situ   Tendonitis of elbow, left     Past Surgical History:  Procedure Laterality Date   ATRIAL FIBRILLATION ABLATION  12/19/2015   BACK SURGERY  12/24/09   fusion C3-C4   BACK SURGERY  2010   fusion L4-L5 Wynetta Emery)   CARDIAC CATHETERIZATION  2009   NONOBSTRUCTIVE ATHERSCLEROTIC CORONARY DISEASE AND NORMAL  LV FUNCTION   CARDIAC CATHETERIZATION N/A 09/08/2014   Procedure: Left Heart Cath and Coronary Angiography;  Surgeon: Lyn Records, MD;  Location: [facility] INVASIVE CV LAB;  Service: Cardiovascular;  Laterality: N/A;   CARDIAC CATHETERIZATION N/A 09/28/2015   Procedure: Left Heart Cath and Coronary Angiography;  Surgeon: Marykay Lex, MD;  Location: [facility] INVASIVE CV LAB;  Service: Cardiovascular;  [facility]                                     Cardiology Office Note:    Date:  10/28/2022   ID:  Theodore [name], DOB 06-09-1934, MRN [id number]  PCP:  Eustaquio Boyden, MD  [facility] Cardiologist:  Chilton Si, MD  [facility] Electrophysiologist:  Will Jorja Loa, MD   Referring MD: Eustaquio Boyden, MD   Chief Complaint  Patient presents with   Follow-up    1 month s/p LAAO    History of Present Illness:    CHADRON DUEL is a 88 y.o. male with a hx of CAD s/p PCI, HTN, HLD, neuropathy, RBBB, NICM with normalization, and atrial fibrillation s/p ablation 2017 with recent AF recurrence. He was restarted on Xarelto however has a hx of epistaxis while on anticoagulation. Given this, he was referred to Dr. Lalla Brothers for potential LAAO with Watchman.    Mr. Winterrowd was seen in consultation 07/08/22 and was felt to be a good candidate to proceed. CTA 6/5 with anatomy suitable to proceed. She is now s/p LAAO with Watchman 27mm FLX Pro device 09/24/22. He was restarted on home Xarelto the evening of his procedure with plans to continue for 45 days (9/28). At that time he will stop Xarelto and start Plavix 75mg  through 6 months of therapy (03/27/23). He will require 6 months of dental SBE with Azithromycin given PCN allergy.   Today he is here and reports he has been well since his procedure. He denies chest pain, SOB, palpitations, LE edema, orthopnea, PND, dizziness, or syncope. Denies bleeding in stool or urine.     Hyperkalemia: K+ noted to be slightly above baseline however will be restarting home medications including Lasix. Plan to recheck at follow up.      Past Medical History:  Diagnosis Date   Atypical atrial flutter (HCC) 12/31/2021   CAP (community acquired pneumonia) 01/19/2018   History of arthritis    History of rheumatic fever    Hypercholesterolemia    Hypertension    Nocturia    PAF (paroxysmal atrial fibrillation) (HCC) 02/2011    Placed on Pradaxa. Did not require cardioversion; spontaneously converted   Peripheral edema    Persistent atrial fibrillation (HCC) 01/21/2011   S/p ablation 12/2015 NSR up until his PCI Dec 2020 when he went into AF post PCI   Presence of Watchman left atrial appendage closure device 09/24/2022   27mm Watchman FLX Pro placed by Dr. 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   at least in situ   Tendonitis of elbow, left     Past Surgical History:  Procedure Laterality Date   ATRIAL FIBRILLATION ABLATION  12/19/2015   BACK SURGERY  12/24/09   fusion C3-C4   BACK SURGERY  2010   fusion L4-L5 Wynetta Emery)   CARDIAC CATHETERIZATION  2009   NONOBSTRUCTIVE ATHERSCLEROTIC CORONARY DISEASE AND NORMAL  LV FUNCTION   CARDIAC CATHETERIZATION N/A 09/08/2014   Procedure: Left Heart Cath and Coronary Angiography;  Surgeon: Lyn Records, MD;  Location: [facility] INVASIVE CV LAB;  Service: Cardiovascular;  Laterality: N/A;   CARDIAC CATHETERIZATION N/A 09/28/2015   Procedure: Left Heart Cath and Coronary Angiography;  Surgeon: Marykay Lex, MD;  Location: [facility] INVASIVE CV LAB;  Service: Cardiovascular;  Peak grad: 26.8 mmHg    Systemic Diam: 2.00 cm MR Vmax:      259.00 cm/s MV E velocity: 35.10 cm/s MV A velocity: 47.70 cm/s MV E/A ratio:  0.74  Zoila Shutter MD Electronically signed by Zoila Shutter MD Signature Date/Time: 05/13/2022/3:26:51  PM    Final   TEE  ECHO TEE 09/24/2022  Narrative TRANSESOPHOGEAL ECHO REPORT    Patient Name:   Theodore [name] Date of Exam: 09/24/2022 Medical Rec #:  [id number]       Height:       70.0 in Accession #:    [id number]      Weight:       198.6 lb Date of Birth:  [birth year]        BSA:          2.081 m Patient Age:    88 years        BP:           100/68 mmHg Patient Gender: M               HR:           74 bpm. Exam Location:  Inpatient  Procedure: TEE-Intraopertive  Indications:    Watchman  History:        Patient has prior history of Echocardiogram examinations. Arrythmias:Atrial Fibrillation.  Sonographer:    Darlys Gales Referring Phys: [id number] Lanier Prude  PROCEDURE: The transesophogeal probe was passed without difficulty through the esophogus of the patient. Sedation performed by different physician. The patient's vital signs; including heart rate, blood pressure, and oxygen saturation; remained stable throughout the procedure. The patient developed no complications during the procedure.  IMPRESSIONS   1. Brocolli appendage with no thrombus. Well positioned 27 mm Watchman FLX device with 1/3 mitral shoulder. No leak by color flow. Negative tugg test. Average compression 16%. 2. Post trans septal puncture only left to right shunting. 3. Left ventricular ejection fraction, by estimation, is 35%. The left ventricle has normal function. The left ventricle demonstrates global hypokinesis. The left ventricular internal cavity size was moderately dilated. 4. Right ventricular systolic function is mildly reduced. The right ventricular size is mildly enlarged. 5. Left atrial size was mildly dilated. No left atrial/left atrial appendage thrombus was detected. 6. Right atrial size was moderately dilated. 7. A small pericardial effusion is present. The pericardial effusion is posterior to the left ventricle. 8. The mitral valve is normal in structure. Trivial mitral valve  regurgitation. No evidence of mitral stenosis. 9. The aortic valve is tricuspid. There is moderate calcification of the aortic valve. There is moderate thickening of the aortic valve. Aortic valve regurgitation is moderate. Mild aortic valve stenosis. 10. The inferior vena cava is normal in size with greater than 50% respiratory variability, suggesting right atrial pressure of 3 mmHg.  Conclusion(s)/Recommendation(s): Normal biventricular function without evidence of hemodynamically significant valvular heart disease.  FINDINGS Left Ventricle: Left ventricular ejection fraction, by estimation, is 35%. The left ventricle has normal function. The left ventricle demonstrates global hypokinesis. The left ventricular internal cavity size was moderately dilated. There is no left ventricular hypertrophy.  Right Ventricle: The right ventricular size is mildly enlarged. No increase in right ventricular wall thickness. Right ventricular systolic function is mildly reduced.  Left Atrium: Left atrial size was mildly dilated. No left atrial/left atrial appendage thrombus was detected.  Right Atrium: Right atrial size was moderately dilated.  Pericardium: A small pericardial effusion is present. The pericardial effusion is  [facility]                                     Cardiology Office Note:    Date:  10/28/2022   ID:  Theodore [name], DOB 06-09-1934, MRN [id number]  PCP:  Eustaquio Boyden, MD  [facility] Cardiologist:  Chilton Si, MD  [facility] Electrophysiologist:  Will Jorja Loa, MD   Referring MD: Eustaquio Boyden, MD   Chief Complaint  Patient presents with   Follow-up    1 month s/p LAAO    History of Present Illness:    CHADRON DUEL is a 88 y.o. male with a hx of CAD s/p PCI, HTN, HLD, neuropathy, RBBB, NICM with normalization, and atrial fibrillation s/p ablation 2017 with recent AF recurrence. He was restarted on Xarelto however has a hx of epistaxis while on anticoagulation. Given this, he was referred to Dr. Lalla Brothers for potential LAAO with Watchman.    Mr. Winterrowd was seen in consultation 07/08/22 and was felt to be a good candidate to proceed. CTA 6/5 with anatomy suitable to proceed. She is now s/p LAAO with Watchman 27mm FLX Pro device 09/24/22. He was restarted on home Xarelto the evening of his procedure with plans to continue for 45 days (9/28). At that time he will stop Xarelto and start Plavix 75mg  through 6 months of therapy (03/27/23). He will require 6 months of dental SBE with Azithromycin given PCN allergy.   Today he is here and reports he has been well since his procedure. He denies chest pain, SOB, palpitations, LE edema, orthopnea, PND, dizziness, or syncope. Denies bleeding in stool or urine.     Hyperkalemia: K+ noted to be slightly above baseline however will be restarting home medications including Lasix. Plan to recheck at follow up.      Past Medical History:  Diagnosis Date   Atypical atrial flutter (HCC) 12/31/2021   CAP (community acquired pneumonia) 01/19/2018   History of arthritis    History of rheumatic fever    Hypercholesterolemia    Hypertension    Nocturia    PAF (paroxysmal atrial fibrillation) (HCC) 02/2011    Placed on Pradaxa. Did not require cardioversion; spontaneously converted   Peripheral edema    Persistent atrial fibrillation (HCC) 01/21/2011   S/p ablation 12/2015 NSR up until his PCI Dec 2020 when he went into AF post PCI   Presence of Watchman left atrial appendage closure device 09/24/2022   27mm Watchman FLX Pro placed by Dr. 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   at least in situ   Tendonitis of elbow, left     Past Surgical History:  Procedure Laterality Date   ATRIAL FIBRILLATION ABLATION  12/19/2015   BACK SURGERY  12/24/09   fusion C3-C4   BACK SURGERY  2010   fusion L4-L5 Wynetta Emery)   CARDIAC CATHETERIZATION  2009   NONOBSTRUCTIVE ATHERSCLEROTIC CORONARY DISEASE AND NORMAL  LV FUNCTION   CARDIAC CATHETERIZATION N/A 09/08/2014   Procedure: Left Heart Cath and Coronary Angiography;  Surgeon: Lyn Records, MD;  Location: [facility] INVASIVE CV LAB;  Service: Cardiovascular;  Laterality: N/A;   CARDIAC CATHETERIZATION N/A 09/28/2015   Procedure: Left Heart Cath and Coronary Angiography;  Surgeon: Marykay Lex, MD;  Location: [facility] INVASIVE CV LAB;  Service: Cardiovascular;

## 2022-10-23 NOTE — Progress Notes (Unsigned)
Cardiology Office Note:  .   Date:  10/24/2022  ID:  Theodore [name], DOB [birth year], MRN [id number] PCP: Theodore Boyden, MD  [facility] Providers Cardiologist:  Theodore Si, MD Electrophysiologist:  Theodore Jorja Loa, MD { Click to update primary MD,subspecialty MD or APP then REFRESH:1}   History of Present Illness: [name]   Theodore [name] is a 88 y.o. male  with a hx of CAD s/p LAD PCI, chronic systolic heart failure, HTN, HLD, persistent atrial fibrillation s/p ablation, s/p Watchman, asthma, LLE DVT, PE, neuropathy,rheumatic fever.   Previous patient of Theodore [name]. Diagnosed with atrial fibrillation in 2014. Ablation 12/19/15 by Theodore [name]. Myoview 08/2014 for chest pain with old inferior scar with peri infarct ischemia and LVEF 39%. LHC 70% osital LAD, 50% prox LAD, 30% OM1 and 35% RCA. Recommended for medical management. Repeat LHC 09/28/15 with 70% ostial prox LAD lesion with no change in FFR. Left ventriculogram LVEF 35-40%. Improved to LVEF 50% on 03/2018. LHC 01/10/2019 and due to progression of disease, underwent atherectomy and PCI with DES. Prior myalgia on Atorvastatin 80mg  but tolerates 40mg  and Zetia 10mg  daily.    Underwent L knee replacement 02/09/17. Bleeding at incision site, Xarelto changed to Aspirin. Developed LLE DVT and PE, Xarelto resumed.    Subsequent monitor 07/2019 with PAC, PVC, ventricular bigeminy but no atrial fibrillation. Echo 09/2020 LVEF 55-60%, global hypkinesis, gr2DD. Sherryll Burger and [place] initiated. Echo 12/2020 LVEF 55-60%, indeterminite diastolic function, RA .01/2021 PCSK9i initiated.   05/2021 noted atrial fibrillation. Xarelto resumed.  Required cardioversion for recurrent atrial fibrillation 01/01/22. However, due to recurrent atrial fibrillation, Amiodarone initiated but not tolerated due to tremor.    Echo 03/03/22 newly reduced LVEF 30-35%, LV global hypokinesis, mild LVH, indeterminate diastolic parameters, RV normal, mild MR,  mild ot moderate AS (mean gradient ). 02/2022 myoview with no ischemia. Echo 05/13/22 LVEF 30-35%, RV function low normal, trivial MR, mild AI.  Amiodarone was stopped 07/11/22 with plan for washout with plan to follow up with Afib clinic in 3 months for Amiodaorne level to consider alternate AAD.   09/24/22 underwent Watchman. TEE during procedure with LVEF 35%.    He presents today for follow up independently. 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 He is not feeling very well today. He is concerned  blood pressure and heart rate are consistently low,  when he recently saw neurology, his heart rate was in the 40s. Today BP 96/54 and his heart rate 63. He denies dizziness, lightheadedness, and syncope, but does endorse extreme fatigue. He does state that he feels short of breath nearly all of the time and has "slowed down a lot" recently. He is still having tremors in his upper extremities, despite being off of amiodarone. Neurology recommended: switching from metoprolol to propranolol or adding primidone, for which he would have to be off of the Xarelto. He also complains of left leg pain ongoing ofr 6 months when sleeping relieved by sleeping with this leg hanging off of the bed. He had ABIs done in 2021 which suggested "no significant obstructive disease". Reports no chest pain, pressure, or tightness. No edema, orthopnea, PND. Reports no palpitations.   ROS: Please see the history of present illness.    All other systems reviewed and are negative.   Studies Reviewed: [name]   EKG Interpretation Date/Time:  Friday October 24 2022 10:21:12 EDT Ventricular Rate:  71 PR Interval:  234 QRS Duration:  150 QT  Interval:  426 QTC Calculation: 462 R Axis:   -79  Text Interpretation: Sinus rhythm with 1st degree A-V block with Premature atrial complexes with Abberant conduction Left axis deviation Right bundle branch block Confirmed by Theodore [name] ([id number]) on 10/24/2022  10:23:19 AM       Risk Assessment/Calculations:    CHA2DS2-VASc Score = 5  {Confirm score is correct.  If not, click here to update score.  REFRESH note.  :1} This indicates a 7.2% annual risk of stroke. The patient's score is based upon: CHF History: 1 HTN History: 1 Diabetes History: 0 Stroke History: 0 Vascular Disease History: 1 Age Score: 2 Gender Score: 0   {This patient has a significant risk of stroke if diagnosed with atrial fibrillation.  Please consider VKA or DOAC agent for anticoagulation if the bleeding risk is acceptable.   You can also use the SmartPhrase .[email or web address] for documentation.   :[id number]}         Physical Exam:   VS:  BP (!) 96/54 Comment: no dizziness  Pulse 63   Ht 5\' 10"  (1.778 m)   Wt 197 lb (89.4 kg)   BMI 28.27 kg/m    Wt Readings from Last 3 Encounters:  10/24/22 197 lb (89.4 kg)  09/24/22 198 lb 10.2 oz (90.1 kg)  09/18/22 198 lb 12.8 oz (90.2 kg)    GEN: Well nourished, well developed in no acute distress NECK: No JVD; No carotid bruits CARDIAC: irregular rhythm, no murmurs, rubs, gallops RESPIRATORY:  Expiratory wheezing bilaterally, no crackles ABDOMEN: Soft, non-tender, non-distended EXTREMITIES:  No edema; No deformity   ASSESSMENT AND PLAN: .    Persistent atrial fibrillation / Hypercoagulable state / s/p watchman 09/2022 - EKG ordered. He is not in atrial fibrillation today. He Theodore follow up with the structural heart team on 10/27/22 and have a follow-up CT 11/25/22. Theodore discuss with Theodore [name] regarding switching from metoprolol to propranolol.   CAD s/p LAD PCI - Stable with no anginal symptoms. No indication for ischemic evaluation.  HFrEF - We Theodore decrease his Entresto today. Low sodium diet, fluid restriction <2L, and daily weights encouraged. Educated to contact our office for weight gain of 2 lbs overnight or 5 lbs in one week.  HLD, LDL goal <70 - He Theodore continue taking lipitor and ezetimibe as prescribed. Heart  healthy diet and regular cardiovascular exercise encouraged.        Dispo: We Theodore reach out in two weeks to see how his blood pressure is since decreasing his medication. He Theodore follow up in the clinic in 6-8 weeks.  Signed, Alver Sorrow, NP

## 2022-10-24 ENCOUNTER — Ambulatory Visit (INDEPENDENT_AMBULATORY_CARE_PROVIDER_SITE_OTHER): Payer: Medicare Other | Admitting: Family

## 2022-10-24 ENCOUNTER — Encounter (HOSPITAL_BASED_OUTPATIENT_CLINIC_OR_DEPARTMENT_OTHER): Payer: Self-pay | Admitting: Family

## 2022-10-24 VITALS — BP 96/54 | HR 63 | Ht 70.0 in | Wt 197.0 lb

## 2022-10-24 DIAGNOSIS — I4819 Other persistent atrial fibrillation: Secondary | ICD-10-CM

## 2022-10-24 DIAGNOSIS — I25118 Atherosclerotic heart disease of native coronary artery with other forms of angina pectoris: Secondary | ICD-10-CM

## 2022-10-24 DIAGNOSIS — I5022 Chronic systolic (congestive) heart failure: Secondary | ICD-10-CM

## 2022-10-24 DIAGNOSIS — D6859 Other primary thrombophilia: Secondary | ICD-10-CM

## 2022-10-24 DIAGNOSIS — Z95818 Presence of other cardiac implants and grafts: Secondary | ICD-10-CM

## 2022-10-24 DIAGNOSIS — Z5181 Encounter for therapeutic drug level monitoring: Secondary | ICD-10-CM

## 2022-10-24 DIAGNOSIS — Z79899 Other long term (current) drug therapy: Secondary | ICD-10-CM

## 2022-10-24 DIAGNOSIS — E785 Hyperlipidemia, unspecified: Secondary | ICD-10-CM

## 2022-10-24 DIAGNOSIS — Z9229 Personal history of other drug therapy: Secondary | ICD-10-CM

## 2022-10-24 MED ORDER — SACUBITRIL-VALSARTAN 24-26 MG PO TABS: 1.0000 | ORAL_TABLET | Freq: Two times a day (BID) | ORAL | Status: DC

## 2022-10-24 MED ORDER — SACUBITRIL-VALSARTAN 24-26 MG PO TABS: 1.0000 | ORAL_TABLET | Freq: Two times a day (BID) | ORAL | 0 refills | Status: DC

## 2022-10-24 MED ORDER — SACUBITRIL-VALSARTAN 24-26 MG PO TABS: 1.0000 | ORAL_TABLET | Freq: Two times a day (BID) | ORAL | 3 refills | Status: DC

## 2022-10-24 NOTE — Patient Instructions (Signed)
Medication Instructions:  DECREASE YOUR ENTRESTO TO 24-26 MG TWICE A DAY   *If you need a refill on your cardiac medications before your next appointment, please call your pharmacy*  Lab Work: BMET/BNP/CBC/AMIODARONE LEVEL/MAGNESIUM TODAY   If you have labs (blood work) drawn today and your tests are completely normal, you will receive your results only by: MyChart Message (if you have MyChart) OR A paper copy in the mail If you have any lab test that is abnormal or we need to change your treatment, we will call you to review the results.  Testing/Procedures: NONE   Follow-Up: At [facility], you and your health needs are our priority.  As part of our continuing mission to provide you with exceptional heart care, we have created designated Provider Care Teams.  These Care Teams include your primary Cardiologist (physician) and Advanced Practice Providers (APPs -  Physician Assistants and Nurse Practitioners) who all work together to provide you with the care you need, when you need it.  We recommend signing up for the patient portal called "MyChart".  Sign up information is provided on this After Visit Summary.  MyChart is used to connect with patients for Virtual Visits (Telemedicine).  Patients are able to view lab/test results, encounter notes, upcoming appointments, etc.  Non-urgent messages can be sent to your provider as well.   To learn more about what you can do with MyChart, go to [email or web address].    Your next appointment:   6 TO 8 week(s)  Provider:   Gillian Shields, NP

## 2022-10-27 ENCOUNTER — Other Ambulatory Visit: Payer: Self-pay | Admitting: Cardiology

## 2022-10-27 ENCOUNTER — Ambulatory Visit: Payer: Medicare Other | Attending: Cardiology | Admitting: Cardiology

## 2022-10-27 VITALS — BP 120/76 | HR 51 | Ht 70.5 in | Wt 195.8 lb

## 2022-10-27 DIAGNOSIS — I25118 Atherosclerotic heart disease of native coronary artery with other forms of angina pectoris: Secondary | ICD-10-CM

## 2022-10-27 DIAGNOSIS — Z9861 Coronary angioplasty status: Secondary | ICD-10-CM

## 2022-10-27 DIAGNOSIS — Z95818 Presence of other cardiac implants and grafts: Secondary | ICD-10-CM | POA: Diagnosis not present

## 2022-10-27 DIAGNOSIS — I5022 Chronic systolic (congestive) heart failure: Secondary | ICD-10-CM | POA: Diagnosis not present

## 2022-10-27 DIAGNOSIS — I2510 Atherosclerotic heart disease of native coronary artery without angina pectoris: Secondary | ICD-10-CM

## 2022-10-27 DIAGNOSIS — I4819 Other persistent atrial fibrillation: Secondary | ICD-10-CM | POA: Diagnosis not present

## 2022-10-27 DIAGNOSIS — I10 Essential (primary) hypertension: Secondary | ICD-10-CM

## 2022-10-27 MED ORDER — CLOPIDOGREL BISULFATE 75 MG PO TABS: 75.0000 mg | ORAL_TABLET | Freq: Every day | ORAL | 1 refills | Status: DC

## 2022-10-27 MED ORDER — AZITHROMYCIN 500 MG PO TABS: ORAL_TABLET | ORAL | 0 refills | Status: DC

## 2022-10-27 NOTE — Patient Instructions (Addendum)
Medication Instructions:  Your physician has recommended you make the following change in your medication:  STOP Xarelto after your last dose on 11/08/22 2.  START Plavix 75 mg on 11/09/22 3.  START Azithromycin 500 mg taking 1 tablet 4 hours prior to any dental treatments  *If you need a refill on your cardiac medications before your next appointment, please call your pharmacy*   Lab Work: None ordered today  If you have labs (blood work) drawn today and your tests are completely normal, you will receive your results only by: MyChart Message (if you have MyChart) OR A paper copy in the mail If you have any lab test that is abnormal or we need to change your treatment, we will call you to review the results.   Testing/Procedures:  None ordered   Follow-Up: At [facility], you and your health needs are our priority.  As part of our continuing mission to provide you with exceptional heart care, we have created designated Provider Care Teams.  These Care Teams include your primary Cardiologist (physician) and Advanced Practice Providers (APPs -  Physician Assistants and Nurse Practitioners) who all work together to provide you with the care you need, when you need it.  We recommend signing up for the patient portal called "MyChart".  Sign up information is provided on this After Visit Summary.  MyChart is used to connect with patients for Virtual Visits (Telemedicine).  Patients are able to view lab/test results, encounter notes, upcoming appointments, etc.  Non-urgent messages can be sent to your provider as well.   To learn more about what you can do with MyChart, go to [email or web address].    Your next appointment:   As scheduled   Provider:   Georgie Chard, NP    Other Instructions

## 2022-10-28 ENCOUNTER — Ambulatory Visit (HOSPITAL_COMMUNITY): Payer: Medicare Other | Admitting: Physician Assistant

## 2022-10-29 ENCOUNTER — Encounter (HOSPITAL_BASED_OUTPATIENT_CLINIC_OR_DEPARTMENT_OTHER): Payer: Self-pay

## 2022-10-29 DIAGNOSIS — I4819 Other persistent atrial fibrillation: Secondary | ICD-10-CM

## 2022-10-30 LAB — CBC
Hematocrit: 48.1 % (ref 37.5–51.0)
Hemoglobin: 17.6 g/dL (ref 13.0–17.7)
MCH: 37.3 pg — ABNORMAL HIGH (ref 26.6–33.0)
MCHC: 36.6 g/dL — ABNORMAL HIGH (ref 31.5–35.7)
MCV: 102 fL — ABNORMAL HIGH (ref 79–97)
Platelets: 150 10*3/uL (ref 150–450)
RBC: 4.72 x10E6/uL (ref 4.14–5.80)
RDW: 12.3 % (ref 11.6–15.4)
WBC: 7.5 10*3/uL (ref 3.4–10.8)

## 2022-10-30 LAB — BASIC METABOLIC PANEL
BUN/Creatinine Ratio: 19 (ref 10–24)
BUN: 21 mg/dL (ref 8–27)
CO2: 22 mmol/L (ref 20–29)
Calcium: 9.1 mg/dL (ref 8.6–10.2)
Chloride: 99 mmol/L (ref 96–106)
Creatinine, Ser: 1.12 mg/dL (ref 0.76–1.27)
Glucose: 84 mg/dL (ref 70–99)
Potassium: 4.8 mmol/L (ref 3.5–5.2)
Sodium: 136 mmol/L (ref 134–144)
eGFR: 64 mL/min/{1.73_m2} (ref >59)

## 2022-10-30 LAB — MAGNESIUM: Magnesium: 2.2 mg/dL (ref 1.6–2.3)

## 2022-10-30 LAB — AMIODARONE (CORDARONE), S/P
AMIODARONE: <100 ng/mL — ABNORMAL LOW (ref 1000–2500)
DESETHYLAMIODARONE: <100 ng/mL

## 2022-10-30 LAB — BRAIN NATRIURETIC PEPTIDE: BNP: 153.5 pg/mL — ABNORMAL HIGH (ref 0.0–100.0)

## 2022-10-30 MED ORDER — PROPRANOLOL HCL ER 80 MG PO CP24
80.0000 mg | ORAL_CAPSULE | Freq: Every day | ORAL | 2 refills | Status: DC
Start: 2022-10-30 — End: 2022-12-01

## 2022-11-06 NOTE — Telephone Encounter (Signed)
FYI

## 2022-11-06 NOTE — Addendum Note (Signed)
Addended by: Alver Sorrow on: 11/06/2022 12:31 PM   Modules accepted: Orders

## 2022-11-06 NOTE — Telephone Encounter (Signed)
Patient follow up with you

## 2022-11-15 ENCOUNTER — Other Ambulatory Visit (HOSPITAL_BASED_OUTPATIENT_CLINIC_OR_DEPARTMENT_OTHER): Payer: Self-pay | Admitting: Cardiovascular Disease

## 2022-11-25 ENCOUNTER — Other Ambulatory Visit (HOSPITAL_COMMUNITY): Payer: Medicare Other

## 2022-11-25 ENCOUNTER — Ambulatory Visit (HOSPITAL_COMMUNITY)
Admission: RE | Admit: 2022-11-25 | Discharge: 2022-11-25 | Disposition: A | Discharge status: Home / Self Care | Payer: Medicare Other | Source: Ambulatory Visit | Attending: Cardiology | Admitting: Cardiology

## 2022-11-25 DIAGNOSIS — I4819 Other persistent atrial fibrillation: Secondary | ICD-10-CM | POA: Insufficient documentation

## 2022-11-25 DIAGNOSIS — Z95818 Presence of other cardiac implants and grafts: Secondary | ICD-10-CM | POA: Insufficient documentation

## 2022-11-25 MED ORDER — IOHEXOL 350 MG/ML SOLN
100.0000 mL | Freq: Once | INTRAVENOUS | Status: AC | PRN
  Administered 2022-11-25: 100 mL via INTRAVENOUS

## 2022-11-27 ENCOUNTER — Telehealth: Payer: Self-pay

## 2022-11-27 NOTE — Telephone Encounter (Signed)
Reviewed results with patient who verbalized understanding.   Confirmed the patient STOPPED XARELTO and STARTED PLAVIX as directed. 03/25/2023 visit confirmed.  Will send resilts to Gillian Shields as the patient has a visit with her 12/01/22 and wants to discuss pulmonary hypertension.  Medication list updated.

## 2022-11-27 NOTE — Telephone Encounter (Signed)
-----   Message from Rossie Muskrat [facility] sent at 11/27/2022  5:51 AM EDT ----- CT shows the appendage is successfully closed. The watchman is in good position with a small communication around the device. OK to continue with planned post implant medication regimen.  Sheria Lang T. Lalla Brothers, MD, [facility], [facility] Cardiac Electrophysiology

## 2022-11-28 NOTE — Telephone Encounter (Signed)
Update

## 2022-12-01 ENCOUNTER — Ambulatory Visit (INDEPENDENT_AMBULATORY_CARE_PROVIDER_SITE_OTHER): Payer: Medicare Other | Admitting: Family

## 2022-12-01 ENCOUNTER — Encounter (HOSPITAL_BASED_OUTPATIENT_CLINIC_OR_DEPARTMENT_OTHER): Payer: Self-pay | Admitting: Family

## 2022-12-01 VITALS — BP 108/62 | HR 57 | Ht 70.0 in | Wt 198.6 lb

## 2022-12-01 DIAGNOSIS — Z95818 Presence of other cardiac implants and grafts: Secondary | ICD-10-CM

## 2022-12-01 DIAGNOSIS — I25118 Atherosclerotic heart disease of native coronary artery with other forms of angina pectoris: Secondary | ICD-10-CM

## 2022-12-01 DIAGNOSIS — E785 Hyperlipidemia, unspecified: Secondary | ICD-10-CM

## 2022-12-01 DIAGNOSIS — I4819 Other persistent atrial fibrillation: Secondary | ICD-10-CM

## 2022-12-01 DIAGNOSIS — I5022 Chronic systolic (congestive) heart failure: Secondary | ICD-10-CM

## 2022-12-01 MED ORDER — METOPROLOL SUCCINATE ER 50 MG PO TB24
50.0000 mg | ORAL_TABLET | Freq: Every day | ORAL | Status: DC
Start: 2022-12-01 — End: 2022-12-23

## 2022-12-01 NOTE — Patient Instructions (Addendum)
Medication Instructions:  Your physician has recommended you make the following change in your medication:  Stop Propranolol Start Metoprolol succinate 50mg  daily Start Mucinex 12 hours as needed  *If you need a refill on your cardiac medications before your next appointment, please call your pharmacy*   Follow-Up: At [facility], you and your health needs are our priority.  As part of our continuing mission to provide you with exceptional heart care, we have created designated Provider Care Teams.  These Care Teams include your primary Cardiologist (physician) and Advanced Practice Providers (APPs -  Physician Assistants and Nurse Practitioners) who all work together to provide you with the care you need, when you need it.  We recommend signing up for the patient portal called "MyChart".  Sign up information is provided on this After Visit Summary.  MyChart is used to connect with patients for Virtual Visits (Telemedicine).  Patients are able to view lab/test results, encounter notes, upcoming appointments, etc.  Non-urgent messages can be sent to your provider as well.   To learn more about what you can do with MyChart, go to [email or web address].    Your next appointment:   Follow up in 2-3 months with Gillian Shields, NP Follow up in 5-6 months with Dr. Duke Salvia We will message you on MyChart within 2 weeks to check in.

## 2022-12-01 NOTE — Progress Notes (Signed)
Cardiology Office Note:  .   Date:  12/01/2022  ID:  Theodore [name], DOB 1934-04-22, MRN [id number] PCP: Eustaquio Boyden, MD  [facility] Providers Cardiologist:  Chilton Si, MD Electrophysiologist:  Will Jorja Loa, MD    History of Present Illness: [name]   Theodore [name] is a 88 y.o. male  with a hx of CAD s/p LAD PCI, chronic systolic heart failure, HTN, HLD, persistent atrial fibrillation s/p ablation, s/p Watchman, asthma, LLE DVT, PE, neuropathy,rheumatic fever.   Previous patient of Dr. Patty Sermons. Diagnosed with atrial fibrillation in 2014. Ablation 12/19/15 by Dr. Elberta Fortis. Myoview 08/2014 for chest pain with old inferior scar with peri infarct ischemia and LVEF 39%. LHC 70% osital LAD, 50% prox LAD, 30% OM1 and 35% RCA. Recommended for medical management. Repeat LHC 09/28/15 with 70% ostial prox LAD lesion with no change in FFR. Left ventriculogram LVEF 35-40%. Improved to LVEF 50% on 03/2018. LHC 01/10/2019 and due to progression of disease, underwent atherectomy and PCI with DES. Prior myalgia on Atorvastatin 80mg  but tolerates 40mg  and Zetia 10mg  daily.    Underwent L knee replacement 02/09/17. Bleeding at incision site, Xarelto changed to Aspirin. Developed LLE DVT and PE, Xarelto resumed.    Monitor 07/2019 with PAC, PVC, ventricular bigeminy but no atrial fibrillation. Echo 09/2020 LVEF 55-60%, global hypkinesis, gr2DD. Sherryll Burger and [place] initiated. Echo 12/2020 LVEF 55-60%, indeterminite diastolic function, RA .01/2021 PCSK9i initiated.   05/2021 noted atrial fibrillation. Xarelto resumed.  Required cardioversion for recurrent atrial fibrillation 01/01/22. However, due to recurrent atrial fibrillation, Amiodarone initiated but stopped due to tremor. Of not, tremor did not improve off Amiodarone.   Echo 03/03/22 newly reduced LVEF 30-35%, LV global hypokinesis, mild LVH, indeterminate diastolic parameters, RV normal, mild MR, mild ot moderate AS (mean gradient  ). 02/2022 myoview with no ischemia. Echo 05/13/22 LVEF 30-35%, RV function low normal, trivial MR, mild AI.   Amiodarone was stopped 07/11/22 with plan for washout with plan to follow up with Afib clinic in 3 months for Amiodaorne level to consider alternate AAD. 09/24/22 underwent Watchman. TEE during procedure with LVEF 35%.   Seen 10/24/22. Metoprolol transitioned to Propranolol with EP input per neurology recommendation to help with tremor. Entresto reduced to 24-26mg  BID due to hypotension. Spironolactone previously stopped due to hypotension.    He presents today for follow up independently. 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 He denies dizziness, lightheadedness, and syncope, but does endorse generalized fatigue, exertional dyspnea. Some improvement since reduction of Entresto. Tremor improved on Propranolol. Follows with neurology with future consideration of Primidone as no longer on Xarelto. Persistently bothered by low heart rate routinely in the 50s. Interested in trying lower dose Metoprolol Succinate to see if it helps. Blood pressure at home: systolic has gone up, diastolic remains lower. We reviewed recent CT in detail as well as the NYHA stages of heart failure and plans for future management. Notes persistent sensation of chest congestion worse in morning and at end of day with no cough. Using inhalers as prescribed.   ROS: Please see the history of present illness.    All other systems reviewed and are negative.   Studies Reviewed: .        Cardiac Studies & Procedures   CARDIAC CATHETERIZATION  CARDIAC CATHETERIZATION 01/11/2019  Narrative  Prox LAD to Mid LAD lesion is 80% stenosed.  A drug-eluting stent was successfully placed using a STENT RESOLUTE ONYX 3.5X18.  Post  intervention, there is a 0% residual stenosis.  Mid LAD lesion is 70% stenosed.  A drug-eluting stent was successfully placed using a STENT RESOLUTE ONYX 2.75X26.   Post intervention, there is a 0% residual stenosis.  1. Severe, heavily calcified stenoses in the proximal and mid LAD 2. Successful orbital atherectomy of the proximal and mid LAD 3. Successful placement of 2 drug eluting stents in the proximal and mid LAD (overlapping)  Recommendations: Continue ASA and Plavix for at least six months.  Findings Coronary Findings Diagnostic  Dominance: Right  Left Anterior Descending Prox LAD to Mid LAD lesion is 80% stenosed. The lesion is calcified. Mid LAD lesion is 70% stenosed. The lesion is calcified.  Intervention  Prox LAD to Mid LAD lesion Stent CATH VISTA GUIDE 6FR XBLAD3.5 guide catheter was inserted. Lesion crossed with guidewire using a WIRE COUGAR XT STRL 190CM. Pre-stent angioplasty was performed using a BALLOON SAPPHIRE 2.5X20. A drug-eluting stent was successfully placed using a STENT RESOLUTE ONYX 3.5X18. Stent strut is well apposed. Post-stent angioplasty was performed using a BALLOON SAPPHIRE [place] 3.75X8. Post-Intervention Lesion Assessment The intervention was successful. Pre-interventional TIMI flow is 3. Post-intervention TIMI flow is 3. No complications occurred at this lesion. There is a 0% residual stenosis post intervention.  Mid LAD lesion Stent Pre-stent angioplasty was performed using a BALLOON SAPPHIRE 2.5X20. A drug-eluting stent was successfully placed using a STENT RESOLUTE ONYX 2.75X26. Stent strut is well apposed. Post-stent angioplasty was performed using a BALLOON SAPPHIRE [place] 3.0X8. Post-Intervention Lesion Assessment The intervention was successful. Pre-interventional TIMI flow is 3. Post-intervention TIMI flow is 3. No complications occurred at this lesion. There is a 0% residual stenosis post intervention.   CARDIAC CATHETERIZATION 01/10/2019  Narrative  Prox LAD lesion is 80% stenosed. Prox LAD to Mid LAD lesion is 60% stenosed with 50% stenosed side branch in 2nd Diag.  Dist LAD lesion is 45%  stenosed.  Ost Cx to Prox Cx lesion is 10% stenosed. 1st Mrg lesion is 30% stenosed. Prox Cx to Mid Cx lesion is 30% stenosed.  -----------  Dist RCA lesion is 30% stenosed with 30% stenosed side branch in RPDA.  -----------  The left ventricular systolic function is normal. The left ventricular ejection fraction is 55-65% by visual estimate.  LV end diastolic pressure is moderately elevated.  SUMMARY  Diffuse calcified coronary disease with severe single-vessel disease involving the proximal LAD -> 75 to 80% followed by extensive 50% stenosis that is DFR positive at 0.82.  This represents significant progression of disease from 2017  Otherwise mild to moderate disease.  Normal LVEF with moderately elevated LVEDP.   RECOMMENDATIONS  With proximal LAD disease, heavily calcified, this will require atherectomy PCI.  Given the issues with holding Xarelto, and COVID-19 screening test, I feel the best course of action is to place the patient in the hospital tonight and plan for atherectomy tomorrow with Dr. Verne Carrow. ->  We will load Plavix today 300 mg daily, along with aspirin 324 mg.  Would likely be L to restart Xarelto tomorrow night  Continue other home medications.    Bryan Lemma, M.D., M.S. Interventional Cardiologist  Pager # [phone] Phone # [phone] [address] [place], [place] [address]  Findings Coronary Findings Diagnostic  Dominance: Right  Left Main Vessel is large. The vessel exhibits minimal luminal irregularities. The vessel is mildly calcified.  Left Anterior Descending Prox LAD lesion is 80% stenosed. The lesion is type C, located proximal to the major branch,  eccentric and concentric. The lesion is severely calcified. Pressure wire/FFR was performed on the lesion. DFR 0.82 Prox LAD to Mid LAD lesion is 60% stenosed with 50% stenosed side branch in 2nd Diag. The lesion is type C, tubular and eccentric. The lesion  is calcified. Dist LAD lesion is 45% stenosed. The lesion is tubular and smooth. With some spasm component responsive to nitroglycerin  First Diagonal Branch Vessel is small in size.  Second Diagonal Branch Vessel is moderate in size.  Lateral Second Diagonal Branch Vessel is small in size.  Left Circumflex Small vessel after the OM takeoff. Ost Cx to Prox Cx lesion is 10% stenosed. The lesion is mildly calcified. Prox Cx to Mid Cx lesion is 30% stenosed.  First Obtuse Marginal Branch Vessel is small in size. Is a very small proximal branch occlusion. 1st Mrg lesion is 30% stenosed.  Second Obtuse Marginal Branch Vessel is small in size.  First Left Posterolateral Branch Vessel is small in size.  Left Posterior Atrioventricular Artery Vessel is small in size.  Right Coronary Artery Vessel is large. There is mild diffuse disease throughout the vessel. Dist RCA lesion is 30% stenosed with 30% stenosed side branch in RPDA.  Acute Marginal Branch Vessel is small in size. Vessel is angiographically normal.  Right Ventricular Branch Vessel is small in size.  Right Posterior Descending Artery Vessel is moderate in size.  Right Posterior Atrioventricular Artery Vessel is small in size.  First Right Posterolateral Branch Vessel is small in size.  Second Right Posterolateral Branch  Third Right Posterolateral Branch Vessel is small in size.  Intervention  No interventions have been documented.   STRESS TESTS  MYOCARDIAL PERFUSION IMAGING 03/11/2022  Narrative   Findings are consistent with no ischemia. The study is intermediate risk.   No ST deviation was noted.   LV perfusion is abnormal. There is no evidence of ischemia. There is no evidence of infarction. Defect 1: There is a medium defect with moderate reduction in uptake present in the apical to mid apex location(s) that is fixed. There is abnormal wall motion in the defect area. Consistent with artifact  caused by diaphragmatic attenuation.   Left ventricular function is abnormal. Global function is moderately reduced. Nuclear stress EF: 43 %. The left ventricular ejection fraction is moderately decreased (30-44%). End diastolic cavity size is mildly enlarged. End systolic cavity size is mildly enlarged.   Prior study available for comparison from 08/11/2014.  Abnormal, intermediate risk stress nuclear study with inferior apical thinning versus prior infarct but no ischemia; gated ejection fraction 43% with global hypokinesis and mild left ventricular enlargement.  Study interpreted as intermediate risk due to reduced LV function.  Suggest echocardiogram to better assess LV function.   ECHOCARDIOGRAM  ECHOCARDIOGRAM COMPLETE 05/13/2022  Narrative ECHOCARDIOGRAM REPORT    Patient Name:   KYLOR HORIGAN Date of Exam: 05/13/2022 Medical Rec #:  [id number]       Height:       71.0 in Accession #:    [id number]      Weight:       194.4 lb Date of Birth:  10/08/1934        BSA:          2.083 m Patient Age:    87 years        BP:           110/60 mmHg Patient Gender: M  HR:           61 bpm. Exam Location:  Outpatient  Procedure: 3D Echo, 2D Echo, Cardiac Doppler, Color Doppler and Strain Analysis  Indications:    CHF  History:        Patient has prior history of Echocardiogram examinations, most recent 03/03/2022. CAD, COPD, Arrythmias:RBBB, PVC and Atrial Flutter; Risk Factors:Hypertension, Dyslipidemia and Non-Smoker.  Sonographer:    Jeryl Columbia RDCS Referring Phys: [id number] Aditi Rovira S Royalty Fakhouri  IMPRESSIONS   1. Left ventricular ejection fraction, by estimation, is 30 to 35%. Left ventricular ejection fraction by 3D volume is 35 %. The left ventricle has moderately decreased function. The left ventricle demonstrates global hypokinesis. The left ventricular internal cavity size was mildly dilated. There is mild left ventricular hypertrophy. Left ventricular diastolic  parameters are indeterminate. 2. Right ventricular systolic function is low normal. The right ventricular size is mildly enlarged. 3. Left atrial size was mildly dilated. 4. Right atrial size was moderately dilated. 5. The mitral valve is abnormal. Trivial mitral valve regurgitation. 6. The aortic valve is tricuspid. Aortic valve regurgitation is mild. Aortic valve sclerosis/calcification is present, without any evidence of aortic stenosis. Aortic valve mean gradient measures 8.0 mmHg. 7. The inferior vena cava is normal in size with greater than 50% respiratory variability, suggesting right atrial pressure of 3 mmHg. 8. Rhythm strip during this exam demonstrates frequent PVC's.  Comparison(s): No significant change from prior study. 03/03/2022: LVEF 30-35%, global hypokinesis.  FINDINGS Left Ventricle: Left ventricular ejection fraction, by estimation, is 30 to 35%. Left ventricular ejection fraction by 3D volume is 35 %. The left ventricle has moderately decreased function. The left ventricle demonstrates global hypokinesis. The left ventricular internal cavity size was mildly dilated. There is mild left ventricular hypertrophy. Left ventricular diastolic parameters are indeterminate.  Right Ventricle: The right ventricular size is mildly enlarged. No increase in right ventricular wall thickness. Right ventricular systolic function is low normal.  Left Atrium: Left atrial size was mildly dilated.  Right Atrium: Right atrial size was moderately dilated.  Pericardium: There is no evidence of pericardial effusion.  Mitral Valve: The mitral valve is abnormal. There is mild thickening of the anterior and posterior mitral valve leaflet(s). Mild mitral annular calcification. Trivial mitral valve regurgitation.  Tricuspid Valve: The tricuspid valve is grossly normal. Tricuspid valve regurgitation is trivial.  Aortic Valve: The aortic valve is tricuspid. Aortic valve regurgitation is mild. Aortic  regurgitation PHT measures 795 msec. Aortic valve sclerosis/calcification is present, without any evidence of aortic stenosis. Aortic valve mean gradient measures 8.0 mmHg. Aortic valve peak gradient measures 15.5 mmHg. Aortic valve area, by VTI measures 0.71 cm.  Pulmonic Valve: The pulmonic valve was grossly normal. Pulmonic valve regurgitation is trivial.  Aorta: The aortic root and ascending aorta are structurally normal, with no evidence of dilitation.  Venous: The inferior vena cava is normal in size with greater than 50% respiratory variability, suggesting right atrial pressure of 3 mmHg.  IAS/Shunts: No atrial level shunt detected by color flow Doppler.  EKG: Rhythm strip during this exam demonstrates frequent PVC's.   LEFT VENTRICLE PLAX 2D LVIDd:         5.89 cm         Diastology LVIDs:         4.58 cm         LV e' medial:    4.95 cm/s LV PW:         1.99 cm  LV E/e' medial:  7.1 LV IVS:        1.13 cm         LV e' lateral:   4.88 cm/s LVOT diam:     2.00 cm         LV E/e' lateral: 7.2 LV SV:         35 LV SV Index:   17 LVOT Area:     3.14 cm        3D Volume EF LV 3D EF:    Left ventricul ar ejection fraction by 3D volume is 35 %.  3D Volume EF: 3D EF:        35 % LV EDV:       194 ml LV ESV:       127 ml LV SV:        67 ml  RIGHT VENTRICLE RV Basal diam:  5.04 cm RV Mid diam:    4.56 cm RV S prime:     10.10 cm/s TAPSE (M-mode): 2.9 cm  LEFT ATRIUM           Index LA diam:      5.80 cm 2.78 cm/m LA Vol (A4C): 63.6 ml 30.53 ml/m AORTIC VALVE AV Area (Vmax):    0.73 cm AV Area (Vmean):   0.71 cm AV Area (VTI):     0.71 cm AV Vmax:           197.00 cm/s AV Vmean:          133.000 cm/s AV VTI:            0.493 m AV Peak Grad:      15.5 mmHg AV Mean Grad:      8.0 mmHg LVOT Vmax:         45.60 cm/s LVOT Vmean:        30.000 cm/s LVOT VTI:          0.111 m LVOT/AV VTI ratio: 0.23 AI PHT:            795 msec  AORTA Ao Root diam:  3.50 cm Ao Asc diam:  3.10 cm  MITRAL VALVE MV Area (PHT): 2.37 cm    SHUNTS MV Decel Time: 320 msec    Systemic VTI:  0.11 m MR Peak grad: 26.8 mmHg    Systemic Diam: 2.00 cm MR Vmax:      259.00 cm/s MV E velocity: 35.10 cm/s MV A velocity: 47.70 cm/s MV E/A ratio:  0.74  Zoila Shutter MD Electronically signed by Zoila Shutter MD Signature Date/Time: 05/13/2022/3:26:51 PM    Final   TEE  ECHO TEE 09/24/2022  Narrative TRANSESOPHOGEAL ECHO REPORT    Patient Name:   Theodore [name] Date of Exam: 09/24/2022 Medical Rec #:  [id number]       Height:       70.0 in Accession #:    [id number]      Weight:       198.6 lb Date of Birth:  Mar 10, 1934        BSA:          2.081 m Patient Age:    88 years        BP:           100/68 mmHg Patient Gender: M               HR:           74 bpm. Exam Location:  Inpatient  Procedure:  TEE-Intraopertive  Indications:    Watchman  History:        Patient has prior history of Echocardiogram examinations. Arrythmias:Atrial Fibrillation.  Sonographer:    Darlys Gales Referring Phys: [id number] Lanier Prude  PROCEDURE: The transesophogeal probe was passed without difficulty through the esophogus of the patient. Sedation performed by different physician. The patient's vital signs; including heart rate, blood pressure, and oxygen saturation; remained stable throughout the procedure. The patient developed no complications during the procedure.  IMPRESSIONS   1. Brocolli appendage with no thrombus. Well positioned 27 mm Watchman FLX device with 1/3 mitral shoulder. No leak by color flow. Negative tugg test. Average compression 16%. 2. Post trans septal puncture only left to right shunting. 3. Left ventricular ejection fraction, by estimation, is 35%. The left ventricle has normal function. The left ventricle demonstrates global hypokinesis. The left ventricular internal cavity size was moderately dilated. 4. Right ventricular systolic  function is mildly reduced. The right ventricular size is mildly enlarged. 5. Left atrial size was mildly dilated. No left atrial/left atrial appendage thrombus was detected. 6. Right atrial size was moderately dilated. 7. A small pericardial effusion is present. The pericardial effusion is posterior to the left ventricle. 8. The mitral valve is normal in structure. Trivial mitral valve regurgitation. No evidence of mitral stenosis. 9. The aortic valve is tricuspid. There is moderate calcification of the aortic valve. There is moderate thickening of the aortic valve. Aortic valve regurgitation is moderate. Mild aortic valve stenosis. 10. The inferior vena cava is normal in size with greater than 50% respiratory variability, suggesting right atrial pressure of 3 mmHg.  Conclusion(s)/Recommendation(s): Normal biventricular function without evidence of hemodynamically significant valvular heart disease.  FINDINGS Left Ventricle: Left ventricular ejection fraction, by estimation, is 35%. The left ventricle has normal function. The left ventricle demonstrates global hypokinesis. The left ventricular internal cavity size was moderately dilated. There is no left ventricular hypertrophy.  Right Ventricle: The right ventricular size is mildly enlarged. No increase in right ventricular wall thickness. Right ventricular systolic function is mildly reduced.  Left Atrium: Left atrial size was mildly dilated. No left atrial/left atrial appendage thrombus was detected.  Right Atrium: Right atrial size was moderately dilated.  Pericardium: A small pericardial effusion is present. The pericardial effusion is posterior to the left ventricle.  Mitral Valve: The mitral valve is normal in structure. Trivial mitral valve regurgitation. No evidence of mitral valve stenosis.  Tricuspid Valve: The tricuspid valve is normal in structure. Tricuspid valve regurgitation is mild . No evidence of tricuspid  stenosis.  Aortic Valve: The aortic valve is tricuspid. There is moderate calcification of the aortic valve. There is moderate thickening of the aortic valve. Aortic valve regurgitation is moderate. Aortic regurgitation PHT measures 619 msec. Mild aortic stenosis is present. Aortic valve mean gradient measures 7.0 mmHg. Aortic valve peak gradient measures 9.9 mmHg. Aortic valve area, by VTI measures 1.60 cm.  Pulmonic Valve: The pulmonic valve was normal in structure. Pulmonic valve regurgitation is trivial. No evidence of pulmonic stenosis.  Aorta: The aortic root is normal in size and structure.  Venous: The inferior vena cava is normal in size with greater than 50% respiratory variability, suggesting right atrial pressure of 3 mmHg.  IAS/Shunts: No atrial level shunt detected by color flow Doppler.  Additional Comments: Brocolli appendage with no thrombus. Well positioned 27 mm Watchman FLX device with 1/3 mitral shoulder. No leak by color flow. Negative tugg test. Average compression 16%. Post trans septal  puncture only left to right shunting.  LEFT VENTRICLE PLAX 2D LVOT diam:     2.10 cm LV SV:         65 LV SV Index:   31 LVOT Area:     3.46 cm   AORTIC VALVE AV Area (Vmax):    1.56 cm AV Area (Vmean):   1.53 cm AV Area (VTI):     1.60 cm AV Vmax:           157.50 cm/s AV Vmean:          124.000 cm/s AV VTI:            0.408 m AV Peak Grad:      9.9 mmHg AV Mean Grad:      7.0 mmHg LVOT Vmax:         71.10 cm/s LVOT Vmean:        54.800 cm/s LVOT VTI:          0.189 m LVOT/AV VTI ratio: 0.46 AI PHT:            619 msec   SHUNTS Systemic VTI:  0.19 m Systemic Diam: 2.10 cm  Charlton Haws MD Electronically signed by Charlton Haws MD Signature Date/Time: 09/24/2022/12:02:02 PM    Final   MONITORS  LONG TERM MONITOR (3-14 DAYS) 03/28/2022  Narrative Patch Wear Time:  2 days and 22 hours  Predominant rhythm was sinus rhythm Less than 1% ventricular and  supraventricular ectopy Less than 1% supraventricular ectopy 3.6% ventricular ectopy Triggered episodes associated with sinus rhythm PVCs  Will Camnitz, MD           Risk Assessment/Calculations:    CHA2DS2-VASc Score = 5   This indicates a 7.2% annual risk of stroke. The patient's score is based upon: CHF History: 1 HTN History: 1 Diabetes History: 0 Stroke History: 0 Vascular Disease History: 1 Age Score: 2 Gender Score: 0            Physical Exam:   VS:  BP 108/62 (BP Location: Left Arm, Patient Position: Sitting, Cuff Size: Normal)   Pulse (!) 57   Ht 5\' 10"  (1.778 m)   Wt 198 lb 9.6 oz (90.1 kg)   SpO2 99%   BMI 28.50 kg/m    Wt Readings from Last 3 Encounters:  12/01/22 198 lb 9.6 oz (90.1 kg)  10/27/22 195 lb 12.8 oz (88.8 kg)  10/24/22 197 lb (89.4 kg)    GEN: Well nourished, well developed in no acute distress NECK: No JVD; No carotid bruits CARDIAC: RRR, no murmurs, rubs, gallops RESPIRATORY:  Clear to auscultation without rales, wheezing or rhonchi. Expiratory wheezes in all lung fields.  ABDOMEN: Soft, non-tender, non-distended EXTREMITIES:  No edema; No deformity   ASSESSMENT AND PLAN: .    Persistent atrial fibrillation / Hypercoagulable state / s/p watchman 09/2022 - Reports no recent atrial fib.CHA2DS2-VASc Score = 5 [CHF History: 1, HTN History: 1, Diabetes History: 0, Stroke History: 0, Vascular Disease History: 1, Age Score: 2, Gender Score: 0].  Therefore, the patient's annual risk of stroke is 7.2 %. Per Dr. Lalla Brothers, Xarelto has been changed to Plavix as Watchman with good positioning on CT. Neurology previously recommended transition from metoprolol to propranolol.  He does not tremors are improved on propranolol.  However heart rate remains in the 50s.  He wishes to try to escalate his heart rate a bit to see if improves energy level.  Will stop Propranolol and start Toprol 50 mg  daily - he will split his home supply of 100 mg Toprol tablets. If  tremor worsens, could consider lower dose Propranolol ER 60mg  daily.  Of note, tremor not improved off Amiodarone and neurology does not think contributory. Labs 10/24/22 showed Amiodarone had washed out. As maintaining NSR will defer AAD.   CAD s/p LAD PCI - No chest pain, pressure, tightness. Exertional dyspnea likely elated to HFrEF.  No indication for ischemic evaluation. GDMT Atorvastatin, Zetia, Toprol.    HFrEF - 12/2020 LVEF 55-60% ? 02/2022 LVEF 30-35% ? 05/2022 LVEF 30 to 35%  ? 09/2022 LVEF 35% by TEE. NYHA III. GDMT Farxiga, Lasix,  Metoprolol, Entresto. Stop Propranolol start Metoprolol, as above. GDMT has been limited by hypotension. Low sodium diet, fluid restriction <2L, and daily weights encouraged. Educated to contact our office for weight gain of 2 lbs overnight or 5 lbs in one week. Consider discussion of repeat assessment of LVEF (echo vs cardiac PET) at follow up  If BP improves with reduced dose Metoprolol, could re-trial low dose Spironolactone for optimization of GDMT Given persistent symptoms offered referral to [facility] [facility], prefers to continue to follow with our team If LVEF remains persistently low, consider discussion with EP regarding if he is candidate for CCM (cardiac contractility modulation)   HLD, LDL goal <70 - He will continue taking lipitor and ezetimibe as prescribed. Heart healthy diet and regular cardiovascular exercise encouraged.              Dispo: follow up in 2-3 months with Alver Sorrow, NP and in 5-6 months with Dr. Duke Salvia  Signed, Alver Sorrow, NP

## 2022-12-02 ENCOUNTER — Ambulatory Visit (HOSPITAL_BASED_OUTPATIENT_CLINIC_OR_DEPARTMENT_OTHER): Payer: Medicare Other | Admitting: Family

## 2022-12-15 ENCOUNTER — Encounter (HOSPITAL_BASED_OUTPATIENT_CLINIC_OR_DEPARTMENT_OTHER): Payer: Self-pay

## 2022-12-15 NOTE — Telephone Encounter (Signed)
BP update as requested  

## 2022-12-20 ENCOUNTER — Encounter: Payer: Self-pay | Admitting: Internal Medicine

## 2023-01-10 ENCOUNTER — Other Ambulatory Visit: Payer: Self-pay | Admitting: Family Medicine

## 2023-01-13 ENCOUNTER — Other Ambulatory Visit: Payer: Self-pay | Admitting: Internal Medicine

## 2023-01-21 ENCOUNTER — Ambulatory Visit: Payer: Medicare Other

## 2023-01-21 VITALS — Ht 70.0 in | Wt 198.0 lb

## 2023-01-21 DIAGNOSIS — Z0000 Encounter for general adult medical examination without abnormal findings: Secondary | ICD-10-CM

## 2023-01-21 NOTE — Patient Instructions (Signed)
Mr. Pastrana , Thank you for taking time to come for your Medicare Wellness Visit. I appreciate your ongoing commitment to your health goals. Please review the following plan we discussed and let me know if I can assist you in the future.   Referrals/Orders/Follow-Ups/Clinician Recommendations: none  This is a list of the screening recommended for you and due dates:  Health Maintenance  Topic Date Due   DTaP/Tdap/Td vaccine (1 - Tdap) Never done   Flu Shot  09/11/2022   COVID-19 Vaccine (6 - 2023-24 season) 10/12/2022   Medicare Annual Wellness Visit  01/21/2024   Pneumonia Vaccine  Completed   Zoster (Shingles) Vaccine  Completed   HPV Vaccine  Aged Out    Advanced directives: (In Chart) A copy of your advanced directives are scanned into your chart should your provider ever need it.  Next Medicare Annual Wellness Visit scheduled for next year: Yes 01/22/24 @ 8:10am telephone

## 2023-01-21 NOTE — Progress Notes (Signed)
Subjective:   Theodore [name] is a 88 y.o. male who presents for Medicare Annual/Subsequent preventive examination.  Visit Complete: Virtual I connected with  Theodore [name] on 01/21/23 by a audio enabled telemedicine application and verified that I am speaking with the correct person using two identifiers.  Patient Location: Home  Provider Location: Home Office  I discussed the limitations of evaluation and management by telemedicine. The patient expressed understanding and agreed to proceed.  Vital Signs: Because this visit was a virtual/telehealth visit, some criteria may be missing or patient reported. Any vitals not documented were not able to be obtained and vitals that have been documented are patient reported.  Patient Medicare AWV questionnaire was completed by the patient on 01/20/23; I have confirmed that all information answered by patient is correct and no changes since this date.      Objective:    Today's Vitals   01/21/23 0822  Weight: 198 lb (89.8 kg)  Height: 5\' 10"  (1.778 m)   Body mass index is 28.41 kg/m.     09/24/2022    8:36 AM 02/18/2022   11:56 AM 01/21/2022    9:04 AM 01/01/2022    6:49 AM 11/14/2021   10:15 AM 01/08/2021   10:31 AM 08/09/2019    9:09 AM  Advanced Directives  Does Patient Have a Medical Advance Directive? No Yes  Yes Yes Yes Yes  Type of Special educational needs teacher of [place];Living will Living will Living will Healthcare Power of [place];Living will Healthcare Power of [place];Living will Healthcare Power of [place];Living will  Does patient want to make changes to medical advance directive?  No - Patient declined   No - Patient declined Yes (MAU/Ambulatory/Procedural Areas - Information given)   Copy of Healthcare Power of Attorney in Chart?  Yes - validated most recent copy scanned in chart (See row information)   Yes - validated most recent copy scanned in chart (See row information) Yes - validated most recent copy  scanned in chart (See row information) Yes - validated most recent copy scanned in chart (See row information)  Would patient like information on creating a medical advance directive? No - Patient declined          Current Medications (verified) Outpatient Encounter Medications as of 01/21/2023  Medication Sig   acetaminophen (TYLENOL) 500 MG tablet Take 1 tablet (500 mg total) by mouth 3 (three) times daily as needed.   atorvastatin (LIPITOR) 40 MG tablet Take 1 tablet (40 mg total) by mouth daily.   azithromycin (ZITHROMAX) 500 MG tablet TAKE 1 TABLET BY MOUTH 1 HOUR PRIOR TO ANY DENTAL TREATMENTS   BREZTRI AEROSPHERE 160-9-4.8 MCG/ACT AERO USE 2 INHALATIONS BY MOUTH TWICE DAILY   Cholecalciferol (VITAMIN D3) 2000 units TABS Take 2,000 Units by mouth daily.   clopidogrel (PLAVIX) 75 MG tablet Take 1 tablet (75 mg total) by mouth daily.   cyanocobalamin (VITAMIN B12) 500 MCG tablet Take 1 tablet (500 mcg total) by mouth every other day. (Patient taking differently: Take 500 mcg by mouth every Monday, Wednesday, and Friday.)   ezetimibe (ZETIA) 10 MG tablet TAKE 1 TABLET BY MOUTH DAILY   FARXIGA 10 MG TABS tablet TAKE 1 TABLET BY MOUTH DAILY  BEFORE BREAKFAST   furosemide (LASIX) 20 MG tablet Take 1 tablet (20 mg total) by mouth daily.   gabapentin (NEURONTIN) 300 MG capsule Take 900 mg by mouth at bedtime.   ipratropium-albuterol (DUONEB) 0.5-2.5 (3) MG/3ML SOLN 1 neb every 6  hours as needed   pantoprazole (PROTONIX) 40 MG tablet Take 40 mg by mouth daily.   potassium chloride (KLOR-CON M) 10 MEQ tablet TAKE 1 TABLET BY MOUTH TWICE  DAILY   propranolol ER (INDERAL LA) 60 MG 24 hr capsule Take 60 mg by mouth daily.   sacubitril-valsartan (ENTRESTO) 24-26 MG Take 1 tablet by mouth 2 (two) times daily.   traZODone (DESYREL) 50 MG tablet TAKE 1/2 TO 1 TABLET BY MOUTH AT BEDTIME AS NEEDED FOR SLEEP   vitamin C (ASCORBIC ACID) 500 MG tablet Take 500 mg by mouth daily.   No facility-administered  encounter medications on file as of 01/21/2023.    Allergies (verified) Penicillins, Amlodipine, Effexor [venlafaxine], Lisinopril, Mevacor [lovastatin], Vicodin [hydrocodone-acetaminophen], Zocor [simvastatin], and Tamiflu [oseltamivir phosphate]   History: Past Medical History:  Diagnosis Date   Atypical atrial flutter (HCC) 12/31/2021   CAP (community acquired pneumonia) 01/19/2018   History of arthritis    History of rheumatic fever    Hypercholesterolemia    Hypertension    Nocturia    PAF (paroxysmal atrial fibrillation) (HCC) 02/2011   Placed on Pradaxa. Did not require cardioversion; spontaneously converted   Peripheral edema    Persistent atrial fibrillation (HCC) 01/21/2011   S/p ablation 12/2015 NSR up until his PCI Dec 2020 when he went into AF post PCI   Presence of Watchman left atrial appendage closure device 09/24/2022   27mm Watchman FLX Pro placed by Dr. 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   at least in situ   Tendonitis of elbow, left    Past Surgical History:  Procedure Laterality Date   ATRIAL FIBRILLATION ABLATION  12/19/2015   BACK SURGERY  12/24/09   fusion C3-C4   BACK SURGERY  2010   fusion L4-L5 Wynetta Emery)   CARDIAC CATHETERIZATION  2009   NONOBSTRUCTIVE ATHERSCLEROTIC CORONARY DISEASE AND NORMAL  LV FUNCTION   CARDIAC CATHETERIZATION N/A 09/08/2014   Procedure: Left Heart Cath and Coronary Angiography;  Surgeon: Lyn Records, MD;  Location[facility] INVASIVE CV LAB;  Service: Cardiovascular;  Laterality: N/A;   CARDIAC CATHETERIZATION N/A 09/28/2015   Procedure: Left Heart Cath and Coronary Angiography;  Surgeon: Marykay Lex, MD;  Location: [facility] INVASIVE CV LAB;  Service: Cardiovascular;  Laterality: N/A;   CARDIAC CATHETERIZATION N/A 09/28/2015   Procedure: Intravascular Pressure Wire/FFR Study;  Surgeon: Marykay Lex, MD;  Location: [facility] INVASIVE CV LAB;  Service: Cardiovascular;   Laterality: N/A;   CARDIOVERSION N/A 10/23/2015   Procedure: CARDIOVERSION;  Surgeon: Lewayne Bunting, MD;  Location: [facility] ENDOSCOPY;  Service: Cardiovascular;  Laterality: N/A;   CARDIOVERSION N/A 01/01/2022   Procedure: CARDIOVERSION;  Surgeon: Meriam Sprague, MD;  Location: [facility] ENDOSCOPY;  Service: Cardiovascular;  Laterality: N/A;   COLONOSCOPY  2013   per patient, rpt 5 yrs   COLONOSCOPY WITH PROPOFOL N/A 12/02/2018   TAx2, HP, diverticulosis Carman Ching, MD)   CORONARY ATHERECTOMY N/A 01/11/2019   Procedure: CORONARY ATHERECTOMY;  Surgeon: Kathleene Hazel, MD;  Location: [facility] INVASIVE CV LAB;  Service: Cardiovascular;  Laterality: N/A;   CORONARY PRESSURE/FFR STUDY N/A 01/10/2019   Procedure: INTRAVASCULAR PRESSURE WIRE/FFR STUDY;  Surgeon: Marykay Lex, MD;  Location: [facility] INVASIVE CV LAB;  Service: Cardiovascular;  Laterality: N/A;   CORONARY STENT INTERVENTION N/A 01/11/2019   Procedure: CORONARY STENT INTERVENTION;  Surgeon: Kathleene Hazel, MD;  Location: [facility] INVASIVE CV  LAB;  Service: Cardiovascular;  Laterality: N/A;   ELECTROPHYSIOLOGIC STUDY N/A 12/19/2015   Procedure: Atrial Fibrillation Ablation;  Surgeon: Will Jorja Loa, MD;  Location: [facility] INVASIVE CV LAB;  Service: Cardiovascular;  Laterality: N/A;   ESOPHAGOGASTRODUODENOSCOPY (EGD) WITH PROPOFOL N/A 12/02/2018   medium HH Carman Ching, MD)   KNEE ARTHROSCOPY Left 03/2016   Dr. Charlann Boxer   LEFT ATRIAL APPENDAGE OCCLUSION N/A 09/24/2022   Procedure: LEFT ATRIAL APPENDAGE OCCLUSION;  Surgeon: Lanier Prude, MD;  Location: [facility] INVASIVE CV LAB;  Service: Cardiovascular;  Laterality: N/A;   LEFT HEART CATH AND CORONARY ANGIOGRAPHY N/A 01/10/2019   Procedure: LEFT HEART CATH AND CORONARY ANGIOGRAPHY;  Surgeon: Marykay Lex, MD;  Location: [facility] INVASIVE CV LAB;  Service: Cardiovascular;  Laterality: N/A;   PATELLAR TENDON REPAIR Left 2008   POLYPECTOMY  12/02/2018   Procedure: POLYPECTOMY;  Surgeon:  Carman Ching, MD;  Location: [facility] ENDOSCOPY;  Service: Endoscopy;;   REPLACEMENT TOTAL KNEE Right 2006   TEE WITHOUT CARDIOVERSION N/A 09/24/2022   Procedure: TRANSESOPHAGEAL ECHOCARDIOGRAM;  Surgeon: Lanier Prude, MD;  Location: [facility] INVASIVE CV LAB;  Service: Cardiovascular;  Laterality: N/A;   TOTAL KNEE ARTHROPLASTY Left 02/09/2017   Procedure: LEFT TOTAL KNEE ARTHROPLASTY, EXCISION LEFT DISTAL THIGH MASS;  Surgeon: Durene Romans, MD;  Location: [facility] ORS;  Service: Orthopedics;  Laterality: Left;  90 mins   TRICEPS TENDON REPAIR Left 2013   Family History  Problem Relation Age of Onset   Congestive Heart Failure Father 9   COPD Father    Hypertension Father    Asthma Sister    Heart disease Sister        CHF   Heart disease Brother        CHF   COPD Brother    Stroke Neg Hx    Colon cancer Neg Hx    Stomach cancer Neg Hx    CAD Neg Hx        MI   Social History   Socioeconomic History   Marital status: Married    Spouse name: Not on file   Number of children: Not on file   Years of education: Not on file   Highest education level: Master's degree (e.g., MA, MS, MEng, MEd, MSW, MBA)  Occupational History   Occupation: retired    Associate Professor: RETIRED    Comment: Acupuncturist  Tobacco Use   Smoking status: Never   Smokeless tobacco: Never   Tobacco comments:    Never smoke 01/13/22  Vaping Use   Vaping status: Never Used  Substance and Sexual Activity   Alcohol use: Yes    Alcohol/week: 14.0 standard drinks of alcohol    Types: 14 Cans of beer per week    Comment: 2 beers daily 01/13/22   Drug use: No   Sexual activity: Not Currently  Other Topics Concern   Not on file  Social History Narrative   Lives with wife, no pets   Retired   Occ: Acupuncturist   Edu: master's   Activity: golf   Diet: good water, fruits/vegetables daily   Social Determinants of Health   Financial Resource Strain: Low Risk  (01/20/2023)   Overall Financial Resource  Strain (CARDIA)    Difficulty of Paying Living Expenses: Not hard at all  Food Insecurity: No Food Insecurity (01/20/2023)   Hunger Vital Sign    Worried About Running Out of Food in the Last Year: Never true    Ran Out of Food in  the Last Year: Never true  Transportation Needs: No Transportation Needs (01/20/2023)   PRAPARE - Administrator, Civil Service (Medical): No    Lack of Transportation (Non-Medical): No  Physical Activity: Inactive (01/20/2023)   Exercise Vital Sign    Days of Exercise per Week: 0 days    Minutes of Exercise per Session: 0 min  Stress: No Stress Concern Present (01/20/2023)   [employer] of Occupational Health - Occupational Stress Questionnaire    Feeling of Stress : Only a little  Social Connections: Moderately Integrated (01/20/2023)   Social Connection and Isolation Panel [NHANES]    Frequency of Communication with Friends and Family: More than three times a week    Frequency of Social Gatherings with Friends and Family: Once a week    Attends Religious Services: 1 to 4 times per year    Active Member of [employer] or Organizations: No    Attends Banker Meetings: Never    Marital Status: Married    Tobacco Counseling Counseling given: Not Answered Tobacco comments: Never smoke 01/13/22   Clinical Intake:  Pre-visit preparation completed: Yes  Pain : No/denies pain   BMI - recorded: 28.41 Nutritional Status: BMI 25 -29 Overweight Nutritional Risks: None Diabetes: No  How often do you need to have someone help you when you read instructions, pamphlets, or other written materials from your doctor or pharmacy?: 1 - Never  Interpreter Needed?: No  Comments: lives with wife Information entered by :: B.Pierrette Scheu,LPN   Activities of Daily Living    01/20/2023    6:31 AM 09/24/2022    5:12 PM  In your present state of health, do you have any difficulty performing the following activities:  Hearing? 0   Vision? 0    Difficulty concentrating or making decisions? 0   Walking or climbing stairs? 0   Dressing or bathing? 0   Doing errands, shopping? 0 0  Preparing Food and eating ? N   Using the Toilet? N   In the past six months, have you accidently leaked urine? N   Do you have problems with loss of bowel control? N   Managing your Medications? N   Managing your Finances? N   Housekeeping or managing your Housekeeping? N     Patient Care Team: Eustaquio Boyden, MD as PCP - General (Family Medicine) Regan Lemming, MD as PCP - Electrophysiology (Cardiology) Chilton Si, MD as PCP - Cardiology (Cardiology) Chilton Si, MD as Attending Physician (Cardiology) Cherlyn Roberts, MD as Consulting Physician (Dermatology) Waymon Budge, MD as Consulting Physician (Pulmonary Disease) Dingeldein, Viviann Spare, MD as Referring Physician (Ophthalmology)  Indicate any recent Medical Services you may have received from other than [facility] providers in the past year (date may be approximate).     Assessment:   This is a routine wellness examination for Damen.  Hearing/Vision screen Hearing Screening - Comments:: Pt says hearing is good Vision Screening - Comments:: Pt says vision is good w/glasses   Goals Addressed   None    Depression Screen    01/21/2023    8:27 AM 08/05/2022    9:00 AM 02/25/2022    8:58 AM 01/21/2022    9:09 AM 01/08/2021   10:35 AM 08/09/2019    9:11 AM 02/14/2019    3:21 PM  PHQ 2/9 Scores  PHQ - 2 Score 0 0 0 0 0 0 0  PHQ- 9 Score  2 4 3   0     Fall  Risk    01/20/2023    6:31 AM 08/05/2022    9:00 AM 02/25/2022    8:57 AM 01/21/2022    9:03 AM 01/15/2022    6:08 AM  Fall Risk   Falls in the past year? 0 0 1 1 1   Number falls in past yr: 0  0 1 0  Injury with Fall? 0  1 1 1   Follow up    Education provided;Falls prevention discussed;Falls evaluation completed     MEDICARE RISK AT HOME: Medicare Risk at Home Any stairs in or around the home?:  Yes If so, are there any without handrails?: Yes Home free of loose throw rugs in walkways, pet beds, electrical cords, etc?: Yes Adequate lighting in your home to reduce risk of falls?: Yes Life alert?: No Use of a cane, walker or w/c?: No Grab bars in the bathroom?: Yes Shower chair or bench in shower?: Yes Elevated toilet seat or a handicapped toilet?: No  TIMED UP AND GO:  Was the test performed?  No    Cognitive Function:    08/09/2019    9:12 AM 08/06/2018    8:32 AM 07/23/2017    9:39 AM 07/17/2016    8:44 AM 08/07/2015    2:05 PM  MMSE - Mini Mental State Exam  Orientation to time 5 5 5 5 5   Orientation to Place 5 5 5 5 5   Registration 3 3 3 3 3   Attention/ Calculation 5 0 0 0 0  Recall 3 3 3 3 3   Language- name 2 objects  0 0 0 0  Language- repeat 1 1 1 1 1   Language- follow 3 step command  0 3 3 3   Language- read & follow direction  0 0 0 0  Write a sentence  0 0 0 0  Copy design  0 0 0 0  Total score  17 20 20 20         01/21/2022    9:07 AM  6CIT Screen  What Year? 0 points  What month? 0 points  What time? 0 points  Count back from 20 0 points  Months in reverse 0 points  Repeat phrase 0 points  Total Score 0 points    Immunizations Immunization History  Administered Date(s) Administered   Fluad Quad(high Dose 65+) 10/31/2021, 11/05/2022   Influenza Split 11/11/2010, 11/11/2011, 11/23/2012   Influenza Whole 11/10/2009   Influenza, High Dose Seasonal PF 11/17/2016, 10/22/2017, 10/26/2018, 10/22/2019   Influenza,inj,Quad PF,6+ Mos 10/25/2015   Influenza-Unspecified 11/10/2013, 10/23/2014, 10/13/2019, 11/06/2020   PFIZER Comirnaty(Gray Top)Covid-19 Tri-Sucrose Vaccine 11/11/2021   PFIZER(Purple Top)SARS-COV-2 Vaccination 02/27/2019, 03/18/2019, 10/24/2019   Pfizer Covid-19 Vaccine Bivalent Booster 17yrs & up 11/06/2020, 11/05/2022, 11/05/2022   Pneumococcal Conjugate-13 10/23/2014   Pneumococcal Polysaccharide-23 11/10/2009   Zoster  Recombinant(Shingrix) 08/09/2019, 09/27/2019   Zoster, Live 02/11/2012    TDAP status: Up to date  Flu Vaccine status: Up to date  Pneumococcal vaccine status: Up to date  Covid-19 vaccine status: Completed vaccines  Qualifies for Shingles Vaccine? Yes   Zostavax completed Yes   Shingrix Completed?: Yes  Screening Tests Health Maintenance  Topic Date Due   COVID-19 Vaccine (7 - 2023-24 season) 12/31/2022   DTaP/Tdap/Td (1 - Tdap) 01/21/2024 (Originally 09/12/1953)   Medicare Annual Wellness (AWV)  01/21/2024   Pneumonia Vaccine 47+ Years old  Completed   INFLUENZA VACCINE  Completed   Zoster Vaccines- Shingrix  Completed   HPV VACCINES  Aged Out    Health  Maintenance  Health Maintenance Due  Topic Date Due   COVID-19 Vaccine (7 - 2023-24 season) 12/31/2022    Colorectal cancer screening: No longer required.   Lung Cancer Screening: (Low Dose CT Chest recommended if Age 50-80 years, 20 pack-year currently smoking OR have quit w/in 15years.) does not qualify.   Lung Cancer Screening Referral: no  Additional Screening:  Hepatitis C Screening: does not qualify; Completed no  Vision Screening: Recommended annual ophthalmology exams for early detection of glaucoma and other disorders of the eye. Is the patient up to date with their annual eye exam?  Yes  Who is the provider or what is the name of the office in which the patient attends annual eye exams? Dr Dellie Burns If pt is not established with a provider, would they like to be referred to a provider to establish care? No .   Dental Screening: Recommended annual dental exams for proper oral hygiene  Diabetic Foot Exam: n/a  Community Resource Referral / Chronic Care Management: CRR required this visit?  No   CCM required this visit?  No    Plan:     I have personally reviewed and noted the following in the patient's chart:   Medical and social history Use of alcohol, tobacco or illicit drugs  Current  medications and supplements including opioid prescriptions. Patient is not currently taking opioid prescriptions. Functional ability and status Nutritional status Physical activity Advanced directives List of other physicians Hospitalizations, surgeries, and ER visits in previous 12 months Vitals Screenings to include cognitive, depression, and falls Referrals and appointments  In addition, I have reviewed and discussed with patient certain preventive protocols, quality metrics, and best practice recommendations. A written personalized care plan for preventive services as well as general preventive health recommendations were provided to patient.    Sue Lush, LPN   16/02/930   After Visit Summary: (MyChart) Due to this being a telephonic visit, the after visit summary with patients personalized plan was offered to patient via MyChart   Nurse Notes: The patient states he is doing well and has no concerns or questions at this time.

## 2023-01-24 ENCOUNTER — Other Ambulatory Visit (HOSPITAL_BASED_OUTPATIENT_CLINIC_OR_DEPARTMENT_OTHER): Payer: Self-pay | Admitting: Family

## 2023-01-24 DIAGNOSIS — I4819 Other persistent atrial fibrillation: Secondary | ICD-10-CM

## 2023-01-27 ENCOUNTER — Encounter (HOSPITAL_BASED_OUTPATIENT_CLINIC_OR_DEPARTMENT_OTHER): Payer: Self-pay

## 2023-01-27 NOTE — Addendum Note (Signed)
Addended by: Alver Sorrow on: 01/27/2023 07:37 AM   Modules accepted: Orders

## 2023-02-19 ENCOUNTER — Other Ambulatory Visit: Payer: Self-pay | Admitting: Family Medicine

## 2023-02-19 DIAGNOSIS — I480 Paroxysmal atrial fibrillation: Secondary | ICD-10-CM

## 2023-02-19 DIAGNOSIS — E7800 Pure hypercholesterolemia, unspecified: Secondary | ICD-10-CM

## 2023-02-19 DIAGNOSIS — E611 Iron deficiency: Secondary | ICD-10-CM

## 2023-02-23 ENCOUNTER — Other Ambulatory Visit (INDEPENDENT_AMBULATORY_CARE_PROVIDER_SITE_OTHER): Payer: Medicare Other

## 2023-02-23 DIAGNOSIS — I480 Paroxysmal atrial fibrillation: Secondary | ICD-10-CM | POA: Diagnosis not present

## 2023-02-23 DIAGNOSIS — E7800 Pure hypercholesterolemia, unspecified: Secondary | ICD-10-CM

## 2023-02-23 DIAGNOSIS — E611 Iron deficiency: Secondary | ICD-10-CM

## 2023-02-23 LAB — COMPREHENSIVE METABOLIC PANEL
ALT: 17 U/L (ref 0–53)
AST: 18 U/L (ref 0–37)
Albumin: 4.1 g/dL (ref 3.5–5.2)
Alkaline Phosphatase: 67 U/L (ref 39–117)
BUN: 19 mg/dL (ref 6–23)
CO2: 30 meq/L (ref 19–32)
Calcium: 9.4 mg/dL (ref 8.4–10.5)
Chloride: 104 meq/L (ref 96–112)
Creatinine, Ser: 1.11 mg/dL (ref 0.40–1.50)
GFR: 60.15 mL/min (ref >60.00)
Glucose, Bld: 91 mg/dL (ref 70–99)
Potassium: 4.2 meq/L (ref 3.5–5.1)
Sodium: 142 meq/L (ref 135–145)
Total Bilirubin: 0.8 mg/dL (ref 0.2–1.2)
Total Protein: 6 g/dL (ref 6.0–8.3)

## 2023-02-23 LAB — CBC WITH DIFFERENTIAL/PLATELET
Basophils Absolute: 0 10*3/uL (ref 0.0–0.1)
Basophils Relative: 0.1 % (ref 0.0–3.0)
Eosinophils Absolute: 0.1 10*3/uL (ref 0.0–0.7)
Eosinophils Relative: 0.8 % (ref 0.0–5.0)
HCT: 51 % (ref 39.0–52.0)
Hemoglobin: 17.1 g/dL — ABNORMAL HIGH (ref 13.0–17.0)
Lymphocytes Relative: 34.6 % (ref 12.0–46.0)
Lymphs Abs: 2.1 10*3/uL (ref 0.7–4.0)
MCHC: 33.6 g/dL (ref 30.0–36.0)
MCV: 102.8 fL — ABNORMAL HIGH (ref 78.0–100.0)
Monocytes Absolute: 0.4 10*3/uL (ref 0.1–1.0)
Monocytes Relative: 7.2 % (ref 3.0–12.0)
Neutro Abs: 3.5 10*3/uL (ref 1.4–7.7)
Neutrophils Relative %: 57.3 % (ref 43.0–77.0)
Platelets: 145 10*3/uL — ABNORMAL LOW (ref 150.0–400.0)
RBC: 4.96 Mil/uL (ref 4.22–5.81)
RDW: 13.2 % (ref 11.5–15.5)
WBC: 6.1 10*3/uL (ref 4.0–10.5)

## 2023-02-23 LAB — LIPID PANEL
Cholesterol: 109 mg/dL (ref 0–200)
HDL: 41.4 mg/dL (ref >39.00)
LDL Cholesterol: 55 mg/dL (ref 0–99)
NonHDL: 67.79
Total CHOL/HDL Ratio: 3
Triglycerides: 65 mg/dL (ref 0.0–149.0)
VLDL: 13 mg/dL (ref 0.0–40.0)

## 2023-02-23 LAB — FERRITIN: Ferritin: 30.8 ng/mL (ref 22.0–322.0)

## 2023-02-23 LAB — IBC PANEL
Iron: 131 ug/dL (ref 42–165)
Saturation Ratios: 33.2 % (ref 20.0–50.0)
TIBC: 394.8 ug/dL (ref 250.0–450.0)
Transferrin: 282 mg/dL (ref 212.0–360.0)

## 2023-02-24 ENCOUNTER — Ambulatory Visit (INDEPENDENT_AMBULATORY_CARE_PROVIDER_SITE_OTHER): Payer: Medicare Other | Admitting: Family

## 2023-02-24 ENCOUNTER — Encounter (HOSPITAL_BASED_OUTPATIENT_CLINIC_OR_DEPARTMENT_OTHER): Payer: Self-pay | Admitting: Family

## 2023-02-24 VITALS — BP 120/86 | HR 69 | Ht 70.0 in | Wt 198.4 lb

## 2023-02-24 DIAGNOSIS — Z95818 Presence of other cardiac implants and grafts: Secondary | ICD-10-CM

## 2023-02-24 DIAGNOSIS — Z79899 Other long term (current) drug therapy: Secondary | ICD-10-CM

## 2023-02-24 DIAGNOSIS — I5022 Chronic systolic (congestive) heart failure: Secondary | ICD-10-CM

## 2023-02-24 DIAGNOSIS — I493 Ventricular premature depolarization: Secondary | ICD-10-CM

## 2023-02-24 DIAGNOSIS — I4819 Other persistent atrial fibrillation: Secondary | ICD-10-CM

## 2023-02-24 DIAGNOSIS — R001 Bradycardia, unspecified: Secondary | ICD-10-CM

## 2023-02-24 DIAGNOSIS — I25118 Atherosclerotic heart disease of native coronary artery with other forms of angina pectoris: Secondary | ICD-10-CM | POA: Diagnosis not present

## 2023-02-24 DIAGNOSIS — I484 Atypical atrial flutter: Secondary | ICD-10-CM

## 2023-02-24 MED ORDER — ATORVASTATIN CALCIUM 40 MG PO TABS: 40.0000 mg | ORAL_TABLET | Freq: Every day | ORAL | 3 refills | Status: DC

## 2023-02-24 MED ORDER — FUROSEMIDE 20 MG PO TABS: 20.0000 mg | ORAL_TABLET | Freq: Every day | ORAL | 3 refills | Status: DC

## 2023-02-24 NOTE — Progress Notes (Signed)
 Cardiology Office Note:  .   Date:  02/24/2023  ID:  Theodore [name], DOB [birth year], MRN [id number] PCP: Rilla Baller, MD  [facility] Providers Cardiologist:  Annabella Scarce, MD Electrophysiologist:  Will Gladis Norton, MD    History of Present Illness: Theodore   PRYCE [name] is a 89 y.o. male with a hx of CAD s/p LAD PCI, chronic systolic heart failure, HTN, HLD, persistent atrial fibrillation s/p ablation, s/p Watchman, asthma, LLE DVT, PE, neuropathy,rheumatic fever.   Previous patient of Dr. Dominick. Diagnosed with atrial fibrillation in 2014. Ablation 12/19/15 by Dr. Norton. Myoview  08/2014 for chest pain with old inferior scar with peri infarct ischemia and LVEF 39%. LHC 70% osital LAD, 50% prox LAD, 30% OM1 and 35% RCA. Recommended for medical management. Repeat LHC 09/28/15 with 70% ostial prox LAD lesion with no change in FFR. Left ventriculogram LVEF 35-40%. Improved to LVEF 50% on 03/2018. LHC 01/10/2019 and due to progression of disease, underwent atherectomy and PCI with DES. Prior myalgia on Atorvastatin  80mg  but tolerates 40mg  and Zetia  10mg  daily.    Underwent L knee replacement 02/09/17. Bleeding at incision site, Xarelto  changed to Aspirin . Developed LLE DVT and PE, Xarelto  resumed.    Monitor 07/2019 with PAC, PVC, ventricular bigeminy but no atrial fibrillation. Echo 09/2020 LVEF 55-60%, global hypkinesis, gr2DD. Entresto  and Farxiga  initiated. Echo 12/2020 LVEF 55-60%, indeterminite diastolic function, RA .01/2021 PCSK9i initiated.   05/2021 noted atrial fibrillation. Xarelto  resumed.  Required cardioversion for recurrent atrial fibrillation 01/01/22. However, due to recurrent atrial fibrillation, Amiodarone  initiated but stopped due to tremor. Of not, tremor did not improve off Amiodarone .   Echo 03/03/22 newly reduced LVEF 30-35%, LV global hypokinesis, mild LVH, indeterminate diastolic parameters, RV normal, mild MR, mild ot moderate AS (mean gradient ).  02/2022 myoview  with no ischemia. Echo 05/13/22 LVEF 30-35%, RV function low normal, trivial MR, mild AI.   Amiodarone  was stopped 07/11/22 with plan for washout with plan to follow up with Afib clinic in 3 months for Amiodaorne level to consider alternate AAD. 09/24/22 underwent Watchman. TEE during procedure with LVEF 35%.   Seen 10/24/22. Metoprolol  transitioned to Propranolol  with EP input per neurology recommendation to help with tremor. Entresto  reduced to 24-26mg  BID due to hypotension. Spironolactone  previously stopped due to hypotension.   At visit 12/01/22 tremor improved on Propranolol  but given persistent bradycardia preferred to trial lower dose Toprol . Later trialed 60mg  Propranolol  but felt more cough, congestion and returned to Toprol  50mg  daily.   He presents today for follow up independently. 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 Home BP readings 100-110/60-65 and heart rate 58-62 bpm. Continues to have low energy and poor stamina despite adjustment of medications. Relative hypotension at home but lightheadedness has resolved. No edema, orthopnea, PND, chest pain. Does note exertional dyspnea. Recent labs with PCP LDL 55 - reviewed LDL goal <70 and reassurance provided lipids controlled on atorvastatin , Zetia .  He was previously on Repatha  but stopped due to concerns about neuropathy which he notes was not a valid concern. His pulmonary status and congestion are better on Metoprolol  as compared to Propranolol  but tremor is worse. Has upcoming visit with neurology to discuss possible addition of primidone for tremor.   ROS: Please see the history of present illness.    All other systems reviewed and are negative.   Studies Reviewed: Theodore   EKG Interpretation Date/Time:  Tuesday February 24 2023 08:21:01 EST Ventricular Rate:  69 PR Interval:  236 QRS Duration:  152 QT Interval:  438 QTC Calculation: 469 R Axis:   -74  Text Interpretation: Sinus rhythm  with sinus arrhythmia with 1st degree A-V block with frequent Premature ventricular complexes Right bundle branch block Confirmed by Vannie Mora ([id number]) on 02/24/2023 8:24:02 AM    Cardiac Studies & Procedures   CARDIAC CATHETERIZATION  CARDIAC CATHETERIZATION 01/11/2019  Narrative  Prox LAD to Mid LAD lesion is 80% stenosed.  A drug-eluting stent was successfully placed using a STENT RESOLUTE ONYX 3.5X18.  Post intervention, there is a 0% residual stenosis.  Mid LAD lesion is 70% stenosed.  A drug-eluting stent was successfully placed using a STENT RESOLUTE ONYX 2.75X26.  Post intervention, there is a 0% residual stenosis.  1. Severe, heavily calcified stenoses in the proximal and mid LAD 2. Successful orbital atherectomy of the proximal and mid LAD 3. Successful placement of 2 drug eluting stents in the proximal and mid LAD (overlapping)  Recommendations: Continue ASA and Plavix  for at least six months.  Findings Coronary Findings Diagnostic  Dominance: Right  Left Anterior Descending Prox LAD to Mid LAD lesion is 80% stenosed. The lesion is calcified. Mid LAD lesion is 70% stenosed. The lesion is calcified.  Intervention  Prox LAD to Mid LAD lesion Stent CATH VISTA GUIDE 6FR XBLAD3.5 guide catheter was inserted. Lesion crossed with guidewire using a WIRE COUGAR XT STRL 190CM. Pre-stent angioplasty was performed using a BALLOON SAPPHIRE 2.5X20. A drug-eluting stent was successfully placed using a STENT RESOLUTE ONYX 3.5X18. Stent strut is well apposed. Post-stent angioplasty was performed using a BALLOON SAPPHIRE [place] 3.75X8. Post-Intervention Lesion Assessment The intervention was successful. Pre-interventional TIMI flow is 3. Post-intervention TIMI flow is 3. No complications occurred at this lesion. There is a 0% residual stenosis post intervention.  Mid LAD lesion Stent Pre-stent angioplasty was performed using a BALLOON SAPPHIRE 2.5X20. A drug-eluting stent was  successfully placed using a STENT RESOLUTE ONYX 2.75X26. Stent strut is well apposed. Post-stent angioplasty was performed using a BALLOON SAPPHIRE [place] 3.0X8. Post-Intervention Lesion Assessment The intervention was successful. Pre-interventional TIMI flow is 3. Post-intervention TIMI flow is 3. No complications occurred at this lesion. There is a 0% residual stenosis post intervention.   CARDIAC CATHETERIZATION 01/10/2019  Narrative  Prox LAD lesion is 80% stenosed. Prox LAD to Mid LAD lesion is 60% stenosed with 50% stenosed side branch in 2nd Diag.  Dist LAD lesion is 45% stenosed.  Ost Cx to Prox Cx lesion is 10% stenosed. 1st Mrg lesion is 30% stenosed. Prox Cx to Mid Cx lesion is 30% stenosed.  -----------  Dist RCA lesion is 30% stenosed with 30% stenosed side branch in RPDA.  -----------  The left ventricular systolic function is normal. The left ventricular ejection fraction is 55-65% by visual estimate.  LV end diastolic pressure is moderately elevated.  SUMMARY  Diffuse calcified coronary disease with severe single-vessel disease involving the proximal LAD -> 75 to 80% followed by extensive 50% stenosis that is DFR positive at 0.82.  This represents significant progression of disease from 2017  Otherwise mild to moderate disease.  Normal LVEF with moderately elevated LVEDP.   RECOMMENDATIONS  With proximal LAD disease, heavily calcified, this will require atherectomy PCI.  Given the issues with holding Xarelto , and COVID-19 screening test, I feel the best course of action is to place the patient in the hospital tonight and plan for atherectomy tomorrow with Dr. Lonni Cash. ->  We will load Plavix   today 300 mg daily, along with aspirin  324 mg.  Would likely be L to restart Xarelto  tomorrow night  Continue other home medications.    Alm Clay, M.D., M.S. Interventional Cardiologist  Pager # [phone] Phone # [phone] [address] [place], [place] [address]  Findings Coronary Findings Diagnostic  Dominance: Right  Left Main Vessel is large. The vessel exhibits minimal luminal irregularities. The vessel is mildly calcified.  Left Anterior Descending Prox LAD lesion is 80% stenosed. The lesion is type C, located proximal to the major branch, eccentric and concentric. The lesion is severely calcified. Pressure wire/FFR was performed on the lesion. DFR 0.82 Prox LAD to Mid LAD lesion is 60% stenosed with 50% stenosed side branch in 2nd Diag. The lesion is type C, tubular and eccentric. The lesion is calcified. Dist LAD lesion is 45% stenosed. The lesion is tubular and smooth. With some spasm component responsive to nitroglycerin   First Diagonal Branch Vessel is small in size.  Second Diagonal Branch Vessel is moderate in size.  Lateral Second Diagonal Branch Vessel is small in size.  Left Circumflex Small vessel after the OM takeoff. Ost Cx to Prox Cx lesion is 10% stenosed. The lesion is mildly calcified. Prox Cx to Mid Cx lesion is 30% stenosed.  First Obtuse Marginal Branch Vessel is small in size. Is a very small proximal branch occlusion. 1st Mrg lesion is 30% stenosed.  Second Obtuse Marginal Branch Vessel is small in size.  First Left Posterolateral Branch Vessel is small in size.  Left Posterior Atrioventricular Artery Vessel is small in size.  Right Coronary Artery Vessel is large. There is mild diffuse disease throughout the vessel. Dist RCA lesion is 30% stenosed with 30% stenosed side branch in RPDA.  Acute Marginal Branch Vessel is small in size. Vessel is angiographically normal.  Right Ventricular Branch Vessel is small in size.  Right Posterior Descending Artery Vessel is moderate in size.  Right Posterior Atrioventricular Artery Vessel is small in size.  First Right Posterolateral Branch Vessel is small in size.  Second Right Posterolateral Branch  Third  Right Posterolateral Branch Vessel is small in size.  Intervention  No interventions have been documented.   STRESS TESTS  MYOCARDIAL PERFUSION IMAGING 03/11/2022  Narrative   Findings are consistent with no ischemia. The study is intermediate risk.   No ST deviation was noted.   LV perfusion is abnormal. There is no evidence of ischemia. There is no evidence of infarction. Defect 1: There is a medium defect with moderate reduction in uptake present in the apical to mid apex location(s) that is fixed. There is abnormal wall motion in the defect area. Consistent with artifact caused by diaphragmatic attenuation.   Left ventricular function is abnormal. Global function is moderately reduced. Nuclear stress EF: 43 %. The left ventricular ejection fraction is moderately decreased (30-44%). End diastolic cavity size is mildly enlarged. End systolic cavity size is mildly enlarged.   Prior study available for comparison from 08/11/2014.  Abnormal, intermediate risk stress nuclear study with inferior apical thinning versus prior infarct but no ischemia; gated ejection fraction 43% with global hypokinesis and mild left ventricular enlargement.  Study interpreted as intermediate risk due to reduced LV function.  Suggest echocardiogram to better assess LV function.  ECHOCARDIOGRAM  ECHOCARDIOGRAM COMPLETE 05/13/2022  Narrative ECHOCARDIOGRAM REPORT    Patient Name:   Theodore [name] Date of Exam: 05/13/2022 Medical Rec #:  [id number]       Height:  71.0 in Accession #:    [id number]      Weight:       194.4 lb Date of Birth:  [birth year]        BSA:          2.083 m Patient Age:    87 years        BP:           110/60 mmHg Patient Gender: M               HR:           61 bpm. Exam Location:  Outpatient  Procedure: 3D Echo, 2D Echo, Cardiac Doppler, Color Doppler and Strain Analysis  Indications:    CHF  History:        Patient has prior history of Echocardiogram examinations, most recent  03/03/2022. CAD, COPD, Arrythmias:RBBB, PVC and Atrial Flutter; Risk Factors:Hypertension, Dyslipidemia and Non-Smoker.  Sonographer:    Orvil Holmes RDCS Referring Phys: [id number] Murel Wigle S Sharnetta Gielow  IMPRESSIONS   1. Left ventricular ejection fraction, by estimation, is 30 to 35%. Left ventricular ejection fraction by 3D volume is 35 %. The left ventricle has moderately decreased function. The left ventricle demonstrates global hypokinesis. The left ventricular internal cavity size was mildly dilated. There is mild left ventricular hypertrophy. Left ventricular diastolic parameters are indeterminate. 2. Right ventricular systolic function is low normal. The right ventricular size is mildly enlarged. 3. Left atrial size was mildly dilated. 4. Right atrial size was moderately dilated. 5. The mitral valve is abnormal. Trivial mitral valve regurgitation. 6. The aortic valve is tricuspid. Aortic valve regurgitation is mild. Aortic valve sclerosis/calcification is present, without any evidence of aortic stenosis. Aortic valve mean gradient measures 8.0 mmHg. 7. The inferior vena cava is normal in size with greater than 50% respiratory variability, suggesting right atrial pressure of 3 mmHg. 8. Rhythm strip during this exam demonstrates frequent PVC's.  Comparison(s): No significant change from prior study. 03/03/2022: LVEF 30-35%, global hypokinesis.  FINDINGS Left Ventricle: Left ventricular ejection fraction, by estimation, is 30 to 35%. Left ventricular ejection fraction by 3D volume is 35 %. The left ventricle has moderately decreased function. The left ventricle demonstrates global hypokinesis. The left ventricular internal cavity size was mildly dilated. There is mild left ventricular hypertrophy. Left ventricular diastolic parameters are indeterminate.  Right Ventricle: The right ventricular size is mildly enlarged. No increase in right ventricular wall thickness. Right ventricular systolic  function is low normal.  Left Atrium: Left atrial size was mildly dilated.  Right Atrium: Right atrial size was moderately dilated.  Pericardium: There is no evidence of pericardial effusion.  Mitral Valve: The mitral valve is abnormal. There is mild thickening of the anterior and posterior mitral valve leaflet(s). Mild mitral annular calcification. Trivial mitral valve regurgitation.  Tricuspid Valve: The tricuspid valve is grossly normal. Tricuspid valve regurgitation is trivial.  Aortic Valve: The aortic valve is tricuspid. Aortic valve regurgitation is mild. Aortic regurgitation PHT measures 795 msec. Aortic valve sclerosis/calcification is present, without any evidence of aortic stenosis. Aortic valve mean gradient measures 8.0 mmHg. Aortic valve peak gradient measures 15.5 mmHg. Aortic valve area, by VTI measures 0.71 cm.  Pulmonic Valve: The pulmonic valve was grossly normal. Pulmonic valve regurgitation is trivial.  Aorta: The aortic root and ascending aorta are structurally normal, with no evidence of dilitation.  Venous: The inferior vena cava is normal in size with greater than 50% respiratory variability, suggesting right atrial pressure of 3 mmHg.  IAS/Shunts: No atrial level shunt detected by color flow Doppler.  EKG: Rhythm strip during this exam demonstrates frequent PVC's.   LEFT VENTRICLE PLAX 2D LVIDd:         5.89 cm         Diastology LVIDs:         4.58 cm         LV e' medial:    4.95 cm/s LV PW:         1.99 cm         LV E/e' medial:  7.1 LV IVS:        1.13 cm         LV e' lateral:   4.88 cm/s LVOT diam:     2.00 cm         LV E/e' lateral: 7.2 LV SV:         35 LV SV Index:   17 LVOT Area:     3.14 cm        3D Volume EF LV 3D EF:    Left ventricul ar ejection fraction by 3D volume is 35 %.  3D Volume EF: 3D EF:        35 % LV EDV:       194 ml LV ESV:       127 ml LV SV:        67 ml  RIGHT VENTRICLE RV Basal diam:  5.04 cm RV Mid  diam:    4.56 cm RV S prime:     10.10 cm/s TAPSE (M-mode): 2.9 cm  LEFT ATRIUM           Index LA diam:      5.80 cm 2.78 cm/m LA Vol (A4C): 63.6 ml 30.53 ml/m AORTIC VALVE AV Area (Vmax):    0.73 cm AV Area (Vmean):   0.71 cm AV Area (VTI):     0.71 cm AV Vmax:           197.00 cm/s AV Vmean:          133.000 cm/s AV VTI:            0.493 m AV Peak Grad:      15.5 mmHg AV Mean Grad:      8.0 mmHg LVOT Vmax:         45.60 cm/s LVOT Vmean:        30.000 cm/s LVOT VTI:          0.111 m LVOT/AV VTI ratio: 0.23 AI PHT:            795 msec  AORTA Ao Root diam: 3.50 cm Ao Asc diam:  3.10 cm  MITRAL VALVE MV Area (PHT): 2.37 cm    SHUNTS MV Decel Time: 320 msec    Systemic VTI:  0.11 m MR Peak grad: 26.8 mmHg    Systemic Diam: 2.00 cm MR Vmax:      259.00 cm/s MV E velocity: 35.10 cm/s MV A velocity: 47.70 cm/s MV E/A ratio:  0.74  Vinie Maxcy MD Electronically signed by Vinie Maxcy MD Signature Date/Time: 05/13/2022/3:26:51 PM    Final  TEE  ECHO TEE 09/24/2022  Narrative TRANSESOPHOGEAL ECHO REPORT    Patient Name:   Theodore [name] Date of Exam: 09/24/2022 Medical Rec #:  [id number]       Height:       70.0 in Accession #:    [id number]      Weight:  198.6 lb Date of Birth:  [birth year]        BSA:          2.081 m Patient Age:    88 years        BP:           100/68 mmHg Patient Gender: M               HR:           74 bpm. Exam Location:  Inpatient  Procedure: TEE-Intraopertive  Indications:    Watchman  History:        Patient has prior history of Echocardiogram examinations. Arrythmias:Atrial Fibrillation.  Sonographer:    Jayson Gaskins Referring Phys: [id number] OLE ONEIDA HOLTS  PROCEDURE: The transesophogeal probe was passed without difficulty through the esophogus of the patient. Sedation performed by different physician. The patient's vital signs; including heart rate, blood pressure, and oxygen saturation; remained stable throughout  the procedure. The patient developed no complications during the procedure.  IMPRESSIONS   1. Brocolli appendage with no thrombus. Well positioned 27 mm Watchman FLX device with 1/3 mitral shoulder. No leak by color flow. Negative tugg test. Average compression 16%. 2. Post trans septal puncture only left to right shunting. 3. Left ventricular ejection fraction, by estimation, is 35%. The left ventricle has normal function. The left ventricle demonstrates global hypokinesis. The left ventricular internal cavity size was moderately dilated. 4. Right ventricular systolic function is mildly reduced. The right ventricular size is mildly enlarged. 5. Left atrial size was mildly dilated. No left atrial/left atrial appendage thrombus was detected. 6. Right atrial size was moderately dilated. 7. A small pericardial effusion is present. The pericardial effusion is posterior to the left ventricle. 8. The mitral valve is normal in structure. Trivial mitral valve regurgitation. No evidence of mitral stenosis. 9. The aortic valve is tricuspid. There is moderate calcification of the aortic valve. There is moderate thickening of the aortic valve. Aortic valve regurgitation is moderate. Mild aortic valve stenosis. 10. The inferior vena cava is normal in size with greater than 50% respiratory variability, suggesting right atrial pressure of 3 mmHg.  Conclusion(s)/Recommendation(s): Normal biventricular function without evidence of hemodynamically significant valvular heart disease.  FINDINGS Left Ventricle: Left ventricular ejection fraction, by estimation, is 35%. The left ventricle has normal function. The left ventricle demonstrates global hypokinesis. The left ventricular internal cavity size was moderately dilated. There is no left ventricular hypertrophy.  Right Ventricle: The right ventricular size is mildly enlarged. No increase in right ventricular wall thickness. Right ventricular systolic function is  mildly reduced.  Left Atrium: Left atrial size was mildly dilated. No left atrial/left atrial appendage thrombus was detected.  Right Atrium: Right atrial size was moderately dilated.  Pericardium: A small pericardial effusion is present. The pericardial effusion is posterior to the left ventricle.  Mitral Valve: The mitral valve is normal in structure. Trivial mitral valve regurgitation. No evidence of mitral valve stenosis.  Tricuspid Valve: The tricuspid valve is normal in structure. Tricuspid valve regurgitation is mild . No evidence of tricuspid stenosis.  Aortic Valve: The aortic valve is tricuspid. There is moderate calcification of the aortic valve. There is moderate thickening of the aortic valve. Aortic valve regurgitation is moderate. Aortic regurgitation PHT measures 619 msec. Mild aortic stenosis is present. Aortic valve mean gradient measures 7.0 mmHg. Aortic valve peak gradient measures 9.9 mmHg. Aortic valve area, by VTI measures 1.60 cm.  Pulmonic Valve: The pulmonic valve was normal in  structure. Pulmonic valve regurgitation is trivial. No evidence of pulmonic stenosis.  Aorta: The aortic root is normal in size and structure.  Venous: The inferior vena cava is normal in size with greater than 50% respiratory variability, suggesting right atrial pressure of 3 mmHg.  IAS/Shunts: No atrial level shunt detected by color flow Doppler.  Additional Comments: Brocolli appendage with no thrombus. Well positioned 27 mm Watchman FLX device with 1/3 mitral shoulder. No leak by color flow. Negative tugg test. Average compression 16%. Post trans septal puncture only left to right shunting.  LEFT VENTRICLE PLAX 2D LVOT diam:     2.10 cm LV SV:         65 LV SV Index:   31 LVOT Area:     3.46 cm   AORTIC VALVE AV Area (Vmax):    1.56 cm AV Area (Vmean):   1.53 cm AV Area (VTI):     1.60 cm AV Vmax:           157.50 cm/s AV Vmean:          124.000 cm/s AV VTI:             0.408 m AV Peak Grad:      9.9 mmHg AV Mean Grad:      7.0 mmHg LVOT Vmax:         71.10 cm/s LVOT Vmean:        54.800 cm/s LVOT VTI:          0.189 m LVOT/AV VTI ratio: 0.46 AI PHT:            619 msec   SHUNTS Systemic VTI:  0.19 m Systemic Diam: 2.10 cm  Maude Emmer MD Electronically signed by Maude Emmer MD Signature Date/Time: 09/24/2022/12:02:02 PM    Final  MONITORS  LONG TERM MONITOR (3-14 DAYS) 03/28/2022  Narrative Patch Wear Time:  2 days and 22 hours  Predominant rhythm was sinus rhythm Less than 1% ventricular and supraventricular ectopy Less than 1% supraventricular ectopy 3.6% ventricular ectopy Triggered episodes associated with sinus rhythm PVCs  Will Camnitz, MD           Risk Assessment/Calculations:    CHA2DS2-VASc Score = 5   This indicates a 7.2% annual risk of stroke. The patient's score is based upon: CHF History: 1 HTN History: 1 Diabetes History: 0 Stroke History: 0 Vascular Disease History: 1 Age Score: 2 Gender Score: 0            Physical Exam:   VS:  BP 120/86 (BP Location: Right Arm, Patient Position: Sitting, Cuff Size: Large)   Pulse 69   Ht 5' 10 (1.778 m)   Wt 198 lb 6.4 oz (90 kg)   SpO2 96%   BMI 28.47 kg/m    Wt Readings from Last 3 Encounters:  02/24/23 198 lb 6.4 oz (90 kg)  01/21/23 198 lb (89.8 kg)  12/01/22 198 lb 9.6 oz (90.1 kg)    GEN: Well nourished, well developed in no acute distress NECK: No JVD; No carotid bruits CARDIAC: RRR, no murmurs, rubs, gallops RESPIRATORY:  Clear to auscultation without rales, wheezing or rhonchi  ABDOMEN: Soft, non-tender, non-distended EXTREMITIES:  No edema; No deformity   ASSESSMENT AND PLAN: .    Persistent atrial fibrillation / Hypercoagulable state / s/p watchman 09/2022 - Per Dr. Cindie, Xarelto  has been changed to Plavix  as Watchman with good positioning on CT. Has upcoming OV with structural heart team 03/2023 for follow up.  Of note, tremor not improved  off Amiodarone  and neurology does not think contributory. Labs 10/24/22 showed Amiodarone  had washed out. As maintaining NSR will defer AAD. Continue SBE prophylaxis   PVC - noted by EKG today. Asymptomatic with no significant palpitations. Prior monitor 03/28/22 with 3.6% PVC burden. If LVEF remains low or decreased via PET consider repeat 3-7 day monitor to reassess PVC burden. As asymptomatic, continue Toprol  50mg  daily.   Tremor - Follows with [facility] neurology. Of note, tremor not improved off Amiodarone  and neurology does not think contributory. Plan to discuss possible addition of Primidone at next visit per neurology notes. Propranolol  previously trialed but did not tolerate with congestion, increased respiratory symptoms.   CAD s/p LAD PCI - Continued exertional dyspnea, exercise intolerance but no chest pain. Given persistent dyspnea despite optimization of heart failure regimen, plan for cardiac PET.  GDMT Atorvastatin , Zetia , Toprol , Plavix .  Recommend aiming for 150 minutes of moderate intensity activity per week and following a heart healthy diet.     HFrEF - 12/2020 LVEF 55-60% ? 02/2022 LVEF 30-35% ? 05/2022 LVEF 30 to 35%  ? 09/2022 LVEF 35% by TEE. NYHA II-III. GDMT Farxiga , Lasix ,  Metoprolol , Entresto . Euvolemic and well compensated on exam.  Given persistent exertional dyspnea, exercise intolerance with prior revascularization, plan for cardiac PET. GDMT has been limited by hypotension (Entresto  lowered). Low sodium diet, fluid restriction <2L, and daily weights encouraged. Educated to contact our office for weight gain of 2 lbs overnight or 5 lbs in one week. Given persistent symptoms previously offered referral to [facility], prefers to continue to follow with our team. Can re-discuss at follow up. If LVEF remains persistently low, consider discussion with EP regarding if he is candidate for CCM (cardiac contractility modulation).   HLD, LDL goal <70 - 02/24/23 LDL  55. Continue Atorvastatin , Zetia .        Informed Consent   Shared Decision Making/Informed Consent The risks [chest pain, shortness of breath, cardiac arrhythmias, dizziness, blood pressure fluctuations, myocardial infarction, stroke/transient ischemic attack, nausea, vomiting, allergic reaction, radiation exposure, metallic taste sensation and life-threatening complications (estimated to be 1 in 10,000)], benefits (risk stratification, diagnosing coronary artery disease, treatment guidance) and alternatives of a cardiac PET stress test were discussed in detail with Mr. Swab and he agrees to proceed.     Dispo: follow up in 2 mos with Dr. Raford as scheduled  Signed, Reche GORMAN Finder, NP

## 2023-02-24 NOTE — Patient Instructions (Addendum)
 Medication Instructions:  Your physician recommends that you continue on your current medications as directed. Please refer to the Current Medication list given to you today.   *If you need a refill on your cardiac medications before your next appointment, please call your pharmacy*  Lab Work: NONE   Testing/Procedures: CARDIAC PET SCAN  ONCE INSURANCE HAS BEEN REVIEWED THEY WILL CALL YOU TO SCHEDULE. IF YOU DO NOT HEAR IN 2 WEEKS CONTACT OFFICE TO FOLLOW UP   Follow-Up: KEEP AS SCHEDULED   Other Instructions   On Youtube can look up AHOY 7 Pt 1 with Samia via [email or web address]      Please report to Radiology at the [facility] Main Entrance 30 minutes early for your test.  [address] [place], [place] [address]                         OR   Please report to Radiology at [facility] Main Entrance, medical mall, 30 mins prior to your test.  [address]  [place], [place]  [phone]  How to Prepare for Your Cardiac PET/CT Stress Test:  Nothing to eat or drink, except water , 3 hours prior to arrival time.  NO caffeine/decaffeinated products, or chocolate 12 hours prior to arrival. (Please note decaffeinated beverages (teas/coffees) still contain caffeine).  If you have caffeine within 12 hours prior, the test will need to be rescheduled.  Medication instructions: Do not take erectile dysfunction medications for 72 hours prior to test (sildenafil, tadalafil)   You may take your remaining medications with water .  NO perfume, cologne or lotion on chest or abdomen area.  Total time is 1 to 2 hours; you may want to bring reading material for the waiting time.   In preparation for your appointment, medication and supplies will be purchased.  Appointment availability is limited, so if you need to cancel or reschedule, please call the Radiology Department at [phone] Geroge Law) OR [phone] [facility]) 24 hours in advance to  avoid a cancellation fee of $100.00  What to Expect When you Arrive:  Once you arrive and check in for your appointment, you will be taken to a preparation room within the Radiology Department.  A technologist or Nurse will obtain your medical history, verify that you are correctly prepped for the exam, and explain the procedure.  Afterwards, an IV will be started in your arm and electrodes will be placed on your skin for EKG monitoring during the stress portion of the exam. Then you will be escorted to the PET/CT scanner.  There, staff will get you positioned on the scanner and obtain a blood pressure and EKG.  During the exam, you will continue to be connected to the EKG and blood pressure machines.  A small, safe amount of a radioactive tracer will be injected in your IV to obtain a series of pictures of your heart along with an injection of a stress agent.    After your Exam:  It is recommended that you eat a meal and drink a caffeinated beverage to counter act any effects of the stress agent.  Drink plenty of fluids for the remainder of the day and urinate frequently for the first couple of hours after the exam.  Your doctor will inform you of your test results within 7-10 business days.  For more information and frequently asked questions, please visit our website: [email or web address]  For questions about your test or how to  prepare for your test, please call: Cardiac Imaging Nurse Navigators Office: [phone]

## 2023-03-02 ENCOUNTER — Encounter: Payer: Self-pay | Admitting: Family Medicine

## 2023-03-02 ENCOUNTER — Ambulatory Visit (INDEPENDENT_AMBULATORY_CARE_PROVIDER_SITE_OTHER): Payer: Medicare Other | Admitting: Family Medicine

## 2023-03-02 VITALS — BP 120/80 | HR 68 | Temp 97.6°F | Ht 69.5 in | Wt 198.2 lb

## 2023-03-02 DIAGNOSIS — Z0000 Encounter for general adult medical examination without abnormal findings: Secondary | ICD-10-CM | POA: Diagnosis not present

## 2023-03-02 DIAGNOSIS — I10 Essential (primary) hypertension: Secondary | ICD-10-CM

## 2023-03-02 DIAGNOSIS — Z9861 Coronary angioplasty status: Secondary | ICD-10-CM

## 2023-03-02 DIAGNOSIS — I700 Atherosclerosis of aorta: Secondary | ICD-10-CM

## 2023-03-02 DIAGNOSIS — D696 Thrombocytopenia, unspecified: Secondary | ICD-10-CM

## 2023-03-02 DIAGNOSIS — I428 Other cardiomyopathies: Secondary | ICD-10-CM

## 2023-03-02 DIAGNOSIS — K219 Gastro-esophageal reflux disease without esophagitis: Secondary | ICD-10-CM

## 2023-03-02 DIAGNOSIS — I2510 Atherosclerotic heart disease of native coronary artery without angina pectoris: Secondary | ICD-10-CM

## 2023-03-02 DIAGNOSIS — E7800 Pure hypercholesterolemia, unspecified: Secondary | ICD-10-CM

## 2023-03-02 DIAGNOSIS — Z95818 Presence of other cardiac implants and grafts: Secondary | ICD-10-CM

## 2023-03-02 DIAGNOSIS — G609 Hereditary and idiopathic neuropathy, unspecified: Secondary | ICD-10-CM

## 2023-03-02 DIAGNOSIS — I480 Paroxysmal atrial fibrillation: Secondary | ICD-10-CM

## 2023-03-02 DIAGNOSIS — J449 Chronic obstructive pulmonary disease, unspecified: Secondary | ICD-10-CM

## 2023-03-02 DIAGNOSIS — Z7189 Other specified counseling: Secondary | ICD-10-CM

## 2023-03-02 DIAGNOSIS — I5042 Chronic combined systolic (congestive) and diastolic (congestive) heart failure: Secondary | ICD-10-CM

## 2023-03-02 MED ORDER — PANTOPRAZOLE SODIUM 40 MG PO TBEC: 40.0000 mg | DELAYED_RELEASE_TABLET | Freq: Every day | ORAL | 4 refills | Status: DC

## 2023-03-02 MED ORDER — CYANOCOBALAMIN 500 MCG PO TABS: 500.0000 ug | ORAL_TABLET | ORAL | Status: AC

## 2023-03-02 NOTE — Patient Instructions (Addendum)
Continue current medicines. Pantoprazole refilled today.  Good to see you today Return as needed or in 1 year for next physical

## 2023-03-02 NOTE — Assessment & Plan Note (Signed)
Previously discussed.

## 2023-03-02 NOTE — Assessment & Plan Note (Addendum)
Continue plavix and statin

## 2023-03-02 NOTE — Assessment & Plan Note (Signed)
Chronic, stable on Breztri controller inhaler with no recent need for duonebs.

## 2023-03-02 NOTE — Progress Notes (Signed)
Ph: [phone] Fax: [phone]   Patient ID: Theodore [name], male    DOB: Dec 24, 1934, 89 y.o.   MRN: [id number]  This visit was conducted in person.  BP 120/80   Pulse 68   Temp 97.6 F (36.4 C) (Oral)   Ht 5' 9.5" (1.765 m)   Wt 198 lb 4 oz (89.9 kg)   SpO2 96%   BMI 28.86 kg/m    CC: CPE Subjective:   HPI: Theodore [name] is a 89 y.o. male presenting on 03/02/2023 for Annual Exam (MCR prt 2 [AWV- 01/21/23].)   Saw health advisor 01/2023 for medicare wellness visit. Note reviewed.   No results found.  Flowsheet Row Clinical Support from 01/21/2023 in [facility]  PHQ-2 Total Score 0          01/20/2023    6:31 AM 08/05/2022    9:00 AM 02/25/2022    8:57 AM 01/21/2022    9:03 AM 01/15/2022    6:08 AM  Fall Risk   Falls in the past year? 0 0 1 1 1   Number falls in past yr: 0  0 1 0  Injury with Fall? 0  1 1 1   Follow up    Education provided;Falls prevention discussed;Falls evaluation completed    Chronic stable mild thrombocytopenia - benign normal variant by heme eval Truett Perna). Erythrocytosis thought due to COPD and CHF. Started on oral iron, f/u PRN.   COPD - on Breztri 2 puffs bid with PRN duonebs, sees pulm Dr Maple Hudson.   Afib with chronic combined CHF, CAD s/p PCI - on Toprol XL, entresto and farxiga. S/p Watchman device placement 09/2022 now off amiodarone and xarelto. Has upcoming structural heart team f/u 03/2023 - discussing coming off plavix as well only on aspirin 81mg . Rec SBE ppx with azithromycin 500mg  x1.    HLD - repatha stopped 05/2022, restarted atorvastatin in addition to zetia.   Vit B12 - continues every other day dosing.  Now off oral iron.  Lab Results  Component Value Date   VITAMINB12 802 08/06/2022   Essential tremor noted to right hand 2023. Saw neurology Dr Sherryll Burger latest 12/2022 - did not tolerate propranolol so now back on metoprolol. Considering primidone. MRI brain 04/2022 found tiny chronic  infarct to R cerebellar hemisphere and mild generalized cerebral atrophy. Continues plavix. Periph neuropath y- managing with gabapentin 900mg  nightly and ALA supplement, as well as folic acid 1mg  daily   Preventative: COLONOSCOPY WITH PROPOFOL 12/02/2018 - TAx2, HP, diverticulosis Randa Evens, Fayrene Fearing, MD)  Also had EGD 11/2018 - medium HH Randa Evens)  Prostate cancer screening - always normal saw urology. Age out.  Lung cancer screening - non smoker Flu shot yearly  COVID vaccine Pfizer 02/2019, 03/2019, booster 10/2019, bivalent 10/2020, 11/2021 Tetanus shot unsure  Pneumovax 2011, prevnar-13 2016  zostavax - ~2014  Shingrix - 07/2019, 09/2019  Advanced directive - Advanced directives: scanned and in chart 07/2016. Wants wife Stanly Mcnelis to be HCPOA then children. Grants HCPOA discretion for medical decisions  Seat belt use discussed  Sunscreen use discussed. No changing moles on skin. Sees derm q72mo.  Non smoker  Alcohol - 1-2 beers but not every day Dentist q6 mo  Eye exam yearly  Bowel - no constipation  Bladder - no incontinence    Lives with wife, no pets Retired Occ: Acupuncturist Edu: master's Activity: golf and walking  Diet: good water, fruits/vegetables daily      Relevant past medical,  surgical, family and social history reviewed and updated as indicated. Interim medical history since our last visit reviewed. Allergies and medications reviewed and updated. Outpatient Medications Prior to Visit  Medication Sig Dispense Refill   acetaminophen (TYLENOL) 500 MG tablet Take 1 tablet (500 mg total) by mouth 3 (three) times daily as needed. 30 tablet 0   atorvastatin (LIPITOR) 40 MG tablet Take 1 tablet (40 mg total) by mouth daily. 90 tablet 3   azithromycin (ZITHROMAX) 500 MG tablet TAKE 1 TABLET BY MOUTH 1 HOUR PRIOR TO ANY DENTAL TREATMENTS 4 tablet 0   BREZTRI AEROSPHERE 160-9-4.8 MCG/ACT AERO USE 2 INHALATIONS BY MOUTH TWICE DAILY 32.1 g 3   Cholecalciferol (VITAMIN D3)  2000 units TABS Take 2,000 Units by mouth daily.     clopidogrel (PLAVIX) 75 MG tablet Take 1 tablet (75 mg total) by mouth daily. 90 tablet 1   ezetimibe (ZETIA) 10 MG tablet TAKE 1 TABLET BY MOUTH DAILY 90 tablet 3   FARXIGA 10 MG TABS tablet TAKE 1 TABLET BY MOUTH DAILY  BEFORE BREAKFAST 90 tablet 3   furosemide (LASIX) 20 MG tablet Take 1 tablet (20 mg total) by mouth daily. 90 tablet 3   gabapentin (NEURONTIN) 300 MG capsule Take 900 mg by mouth at bedtime.     ipratropium-albuterol (DUONEB) 0.5-2.5 (3) MG/3ML SOLN 1 neb every 6 hours as needed 360 mL 12   metoprolol succinate (TOPROL-XL) 50 MG 24 hr tablet Take 50 mg by mouth daily. Take with or immediately following a meal.     sacubitril-valsartan (ENTRESTO) 24-26 MG Take 1 tablet by mouth 2 (two) times daily. 180 tablet 3   traZODone (DESYREL) 50 MG tablet TAKE 1/2 TO 1 TABLET BY MOUTH AT BEDTIME AS NEEDED FOR SLEEP 90 tablet 3   vitamin C (ASCORBIC ACID) 500 MG tablet Take 500 mg by mouth daily.     cyanocobalamin (VITAMIN B12) 500 MCG tablet Take 1 tablet (500 mcg total) by mouth every other day. (Patient taking differently: Take 500 mcg by mouth every Monday, Wednesday, and Friday.) 45 tablet 3   pantoprazole (PROTONIX) 40 MG tablet Take 40 mg by mouth daily.     potassium chloride (KLOR-CON M) 10 MEQ tablet TAKE 1 TABLET BY MOUTH TWICE  DAILY 180 tablet 3   potassium chloride (KLOR-CON M) 10 MEQ tablet Take 1 tablet (10 mEq total) by mouth daily.     No facility-administered medications prior to visit.     Per HPI unless specifically indicated in ROS section below Review of Systems  Constitutional:  Negative for activity change, appetite change, chills, fatigue, fever and unexpected weight change.  HENT:  Negative for hearing loss.   Eyes:  Negative for visual disturbance.  Respiratory:  Negative for cough, chest tightness, shortness of breath and wheezing.   Cardiovascular:  Negative for chest pain, palpitations and leg  swelling.  Gastrointestinal:  Negative for abdominal distention, abdominal pain, blood in stool, constipation, diarrhea, nausea and vomiting.  Genitourinary:  Negative for difficulty urinating and hematuria.  Musculoskeletal:  Negative for arthralgias, myalgias and neck pain.  Skin:  Negative for rash.  Neurological:  Negative for dizziness, seizures, syncope and headaches.  Hematological:  Negative for adenopathy. Bruises/bleeds easily.  Psychiatric/Behavioral:  Negative for dysphoric mood. The patient is not nervous/anxious.     Objective:  BP 120/80   Pulse 68   Temp 97.6 F (36.4 C) (Oral)   Ht 5' 9.5" (1.765 m)   Wt 198 lb 4 oz (  89.9 kg)   SpO2 96%   BMI 28.86 kg/m   Wt Readings from Last 3 Encounters:  03/02/23 198 lb 4 oz (89.9 kg)  02/24/23 198 lb 6.4 oz (90 kg)  01/21/23 198 lb (89.8 kg)      Physical Exam Vitals and nursing note reviewed.  Constitutional:      General: He is not in acute distress.    Appearance: Normal appearance. He is well-developed. He is not ill-appearing.  HENT:     Head: Normocephalic and atraumatic.     Right Ear: Hearing, tympanic membrane, ear canal and external ear normal.     Left Ear: Hearing, tympanic membrane, ear canal and external ear normal.     Mouth/Throat:     Mouth: Mucous membranes are moist.     Pharynx: Oropharynx is clear. No oropharyngeal exudate or posterior oropharyngeal erythema.  Eyes:     General: No scleral icterus.    Extraocular Movements: Extraocular movements intact.     Conjunctiva/sclera: Conjunctivae normal.     Pupils: Pupils are equal, round, and reactive to light.  Neck:     Thyroid: No thyroid mass or thyromegaly.     Vascular: No carotid bruit.  Cardiovascular:     Rate and Rhythm: Normal rate and regular rhythm.     Pulses: Normal pulses.          Radial pulses are 2+ on the right side and 2+ on the left side.     Heart sounds: Normal heart sounds. No murmur heard. Pulmonary:     Effort:  Pulmonary effort is normal. No respiratory distress.     Breath sounds: Normal breath sounds. No wheezing, rhonchi or rales.  Abdominal:     General: Bowel sounds are normal. There is no distension.     Palpations: Abdomen is soft. There is no mass.     Tenderness: There is no abdominal tenderness. There is no guarding or rebound.     Hernia: No hernia is present.  Musculoskeletal:        General: Normal range of motion.     Cervical back: Normal range of motion and neck supple.     Right lower leg: No edema.     Left lower leg: No edema.  Lymphadenopathy:     Cervical: No cervical adenopathy.  Skin:    General: Skin is warm and dry.     Findings: No rash.  Neurological:     General: No focal deficit present.     Mental Status: He is alert and oriented to person, place, and time.  Psychiatric:        Mood and Affect: Mood normal.        Behavior: Behavior normal.        Thought Content: Thought content normal.        Judgment: Judgment normal.       Results for orders placed or performed in visit on 02/23/23  IBC panel   Collection Time: 02/23/23  7:20 AM  Result Value Ref Range   Iron 131 42 - 165 ug/dL   Transferrin 706.2 376.2 - 360.0 mg/dL   Saturation Ratios 83.1 20.0 - 50.0 %   TIBC 394.8 250.0 - 450.0 mcg/dL  Ferritin   Collection Time: 02/23/23  7:20 AM  Result Value Ref Range   Ferritin 30.8 22.0 - 322.0 ng/mL  CBC with Differential/Platelet   Collection Time: 02/23/23  7:20 AM  Result Value Ref Range   WBC 6.1 4.0 -  10.5 K/uL   RBC 4.96 4.22 - 5.81 Mil/uL   Hemoglobin 17.1 (H) 13.0 - 17.0 g/dL   HCT 16.1 09.6 - 04.5 %   MCV 102.8 (H) 78.0 - 100.0 fl   MCHC 33.6 30.0 - 36.0 g/dL   RDW 40.9 81.1 - 91.4 %   Platelets 145.0 (L) 150.0 - 400.0 K/uL   Neutrophils Relative % 57.3 43.0 - 77.0 %   Lymphocytes Relative 34.6 12.0 - 46.0 %   Monocytes Relative 7.2 3.0 - 12.0 %   Eosinophils Relative 0.8 0.0 - 5.0 %   Basophils Relative 0.1 0.0 - 3.0 %   Neutro Abs  3.5 1.4 - 7.7 K/uL   Lymphs Abs 2.1 0.7 - 4.0 K/uL   Monocytes Absolute 0.4 0.1 - 1.0 K/uL   Eosinophils Absolute 0.1 0.0 - 0.7 K/uL   Basophils Absolute 0.0 0.0 - 0.1 K/uL  Comprehensive metabolic panel   Collection Time: 02/23/23  7:20 AM  Result Value Ref Range   Sodium 142 135 - 145 mEq/L   Potassium 4.2 3.5 - 5.1 mEq/L   Chloride 104 96 - 112 mEq/L   CO2 30 19 - 32 mEq/L   Glucose, Bld 91 70 - 99 mg/dL   BUN 19 6 - 23 mg/dL   Creatinine, Ser 7.82 0.40 - 1.50 mg/dL   Total Bilirubin 0.8 0.2 - 1.2 mg/dL   Alkaline Phosphatase 67 39 - 117 U/L   AST 18 0 - 37 U/L   ALT 17 0 - 53 U/L   Total Protein 6.0 6.0 - 8.3 g/dL   Albumin 4.1 3.5 - 5.2 g/dL   GFR 95.62 >13.08 mL/min   Calcium 9.4 8.4 - 10.5 mg/dL  Lipid panel   Collection Time: 02/23/23  7:20 AM  Result Value Ref Range   Cholesterol 109 0 - 200 mg/dL   Triglycerides 65.7 0.0 - 149.0 mg/dL   HDL 84.69 >62.95 mg/dL   VLDL 28.4 0.0 - 13.2 mg/dL   LDL Cholesterol 55 0 - 99 mg/dL   Total CHOL/HDL Ratio 3    NonHDL 67.79     Assessment & Plan:   Problem List Items Addressed This Visit     Essential hypertension (Chronic)   Chronic, stable on current regimen.       COPD mixed type (HCC) (Chronic)   Chronic, stable on Breztri controller inhaler with no recent need for duonebs.       Health maintenance examination - Primary (Chronic)   Preventative protocols reviewed and updated unless pt declined. Discussed healthy diet and lifestyle.       Advanced care planning/counseling discussion (Chronic)   Previously discussed      HLD (hyperlipidemia)   Chronic, stable on zetia and atorvastatin - continue. The ASCVD Risk score (Arnett DK, et al., 2019) failed to calculate for the following reasons:   The 2019 ASCVD risk score is only valid for ages 37 to 74       Atrial fibrillation [facility])   S/p watchman device placement.  Appreciate cardiology care.       GERD (gastroesophageal reflux disease)   Chronic,  stable on daily pantoprazole - refilled today.       Relevant Medications   pantoprazole (PROTONIX) 40 MG tablet   Thrombocytopenia (HCC)   Chronic, mild, stable s/p heme/onc eval.       CAD S/P percutaneous coronary angioplasty   Nonischemic cardiomyopathy (HCC)   Chronic, combined CHF - regularly sees cardiology, pending structural heart clinic 04/2023.  Thoracic aorta atherosclerosis (HCC)   Continue plavix and statin.       Peripheral neuropathy   Appreciate neurology care - now on ALA and gabapentin - will need to verify ALA and folic acid use.       Chronic combined systolic and diastolic CHF (congestive heart failure) (HCC)   Presence of Watchman left atrial appendage closure device     Meds ordered this encounter  Medications   cyanocobalamin (VITAMIN B12) 500 MCG tablet    Sig: Take 1 tablet (500 mcg total) by mouth every Monday, Wednesday, and Friday.   pantoprazole (PROTONIX) 40 MG tablet    Sig: Take 1 tablet (40 mg total) by mouth daily.    Dispense:  90 tablet    Refill:  4    No orders of the defined types were placed in this encounter.   Patient Instructions  Continue current medicines. Pantoprazole refilled today.  Good to see you today Return as needed or in 1 year for next physical   Follow up plan: Return in about 1 year (around 03/01/2024) for annual exam, prior fasting for blood work, medicare wellness visit.  Eustaquio Boyden, MD

## 2023-03-02 NOTE — Assessment & Plan Note (Addendum)
S/p watchman device placement.  Appreciate cardiology care.

## 2023-03-02 NOTE — Assessment & Plan Note (Signed)
Chronic, combined CHF - regularly sees cardiology, pending structural heart clinic 04/2023.

## 2023-03-02 NOTE — Assessment & Plan Note (Addendum)
Appreciate neurology care - now on ALA and gabapentin - will need to verify ALA and folic acid use.

## 2023-03-02 NOTE — Assessment & Plan Note (Signed)
Chronic, mild, stable s/p heme/onc eval.

## 2023-03-02 NOTE — Assessment & Plan Note (Signed)
Chronic, stable on zetia and atorvastatin - continue. The ASCVD Risk score (Arnett DK, et al., 2019) failed to calculate for the following reasons:   The 2019 ASCVD risk score is only valid for ages 41 to 32

## 2023-03-02 NOTE — Assessment & Plan Note (Signed)
Chronic, stable on daily pantoprazole - refilled today.

## 2023-03-02 NOTE — Assessment & Plan Note (Signed)
Preventative protocols reviewed and updated unless pt declined. Discussed healthy diet and lifestyle.  

## 2023-03-02 NOTE — Assessment & Plan Note (Signed)
Chronic, stable on current regimen.  

## 2023-03-10 ENCOUNTER — Other Ambulatory Visit: Payer: Self-pay

## 2023-03-10 ENCOUNTER — Emergency Department (HOSPITAL_BASED_OUTPATIENT_CLINIC_OR_DEPARTMENT_OTHER): Payer: Medicare Other

## 2023-03-10 ENCOUNTER — Telehealth: Payer: Self-pay | Admitting: Cardiology

## 2023-03-10 ENCOUNTER — Encounter (HOSPITAL_BASED_OUTPATIENT_CLINIC_OR_DEPARTMENT_OTHER): Payer: Self-pay | Admitting: Emergency Medicine

## 2023-03-10 ENCOUNTER — Emergency Department (HOSPITAL_BASED_OUTPATIENT_CLINIC_OR_DEPARTMENT_OTHER): Payer: Medicare Other | Admitting: Radiology

## 2023-03-10 ENCOUNTER — Emergency Department (HOSPITAL_BASED_OUTPATIENT_CLINIC_OR_DEPARTMENT_OTHER)
Admission: EM | Admit: 2023-03-10 | Discharge: 2023-03-10 | Disposition: A | Discharge status: Home / Self Care | Payer: Medicare Other | Attending: Emergency Medicine | Admitting: Emergency Medicine

## 2023-03-10 DIAGNOSIS — M79605 Pain in left leg: Secondary | ICD-10-CM | POA: Diagnosis present

## 2023-03-10 MED ORDER — ACETAMINOPHEN 500 MG PO TABS
1000.0000 mg | ORAL_TABLET | Freq: Once | ORAL | Status: AC
  Administered 2023-03-10: 1000 mg via ORAL
  Filled 2023-03-10: qty 2

## 2023-03-10 NOTE — Telephone Encounter (Signed)
Pt c/o medication issue:  1. Name of Medication:   clopidogrel (PLAVIX) 75 MG tablet    2. How are you currently taking this medication (dosage and times per day)?  Take 1 tablet (75 mg total) by mouth daily.      3. Are you having a reaction (difficulty breathing--STAT)? No  4. What is your medication issue? Pt is requesting a callback regarding him being switched to this medication from Xarelto and now he has concerns that he'd like to speak with the nurse about. Please advise

## 2023-03-10 NOTE — Telephone Encounter (Signed)
Spoke with patient, concerns of possible clot in L thigh. Patient states he has had gradually worsening pain that began in his left hip that now radiates down to left knee while ambulating x2 weeks. Denies any swelling, SOB, or chest pain. Patient states he has had sciatic pain before but this feels differently as the pain is "all throughout left thigh". He has contacted his orthopedic provider, appointment in 3 weeks. Patient concerned that switching from xarelto to plavix (since watchman placement) could have caused a possible clot. Made patient aware that I would notify his provider here at [facility] and our office would be in touch with further recommendation.   Patient expressed interest in being referred to vascular provider but instructed that if provider felt necessary, an ultrasound could rule out a possible clot.

## 2023-03-10 NOTE — ED Triage Notes (Addendum)
Concerned for swelling and pain in left knee.  HX knee replacement.  HX watchmen device.  On plavix.

## 2023-03-10 NOTE — ED Provider Notes (Signed)
[place] EMERGENCY DEPARTMENT AT [facility] Provider Note   CSN: [id number] Arrival date & time: 03/10/23  1756     History No chief complaint on file.   HPI Theodore [name] is a 89 y.o. male presenting for chief complaint of left leg pain.  States it gets worse every time he goes upstairs.  Radiates down his left posterior thigh into his left knee.  Denies fevers chills nausea vomiting syncope shortness of breath.  Otherwise ambulatory tolerating p.o. intake.  History of similar symptoms secondary to knee arthritis, hip arthritis and back problems.  Patient's recorded medical, surgical, social, medication list and allergies were reviewed in the Snapshot window as part of the initial history.   Review of Systems   Review of Systems  Constitutional:  Negative for chills and fever.  HENT:  Negative for ear pain and sore throat.   Eyes:  Negative for pain and visual disturbance.  Respiratory:  Negative for cough and shortness of breath.   Cardiovascular:  Negative for chest pain and palpitations.  Gastrointestinal:  Negative for abdominal pain and vomiting.  Genitourinary:  Negative for dysuria and hematuria.  Musculoskeletal:  Negative for arthralgias and back pain.  Skin:  Negative for color change and rash.  Neurological:  Negative for seizures and syncope.  All other systems reviewed and are negative.   Physical Exam Updated Vital Signs BP 128/78   Pulse (!) 56   Temp 97.7 F (36.5 C) (Oral)   Resp 15   Wt 88.5 kg   SpO2 98%   BMI 28.38 kg/m  Physical Exam Vitals and nursing note reviewed.  Constitutional:      General: He is not in acute distress.    Appearance: He is well-developed.  HENT:     Head: Normocephalic and atraumatic.  Eyes:     Conjunctiva/sclera: Conjunctivae normal.  Cardiovascular:     Rate and Rhythm: Normal rate and regular rhythm.     Heart sounds: No murmur heard. Pulmonary:     Effort: Pulmonary effort is normal. No  respiratory distress.     Breath sounds: Normal breath sounds.  Abdominal:     Palpations: Abdomen is soft.     Tenderness: There is no abdominal tenderness.  Musculoskeletal:        General: No swelling.     Cervical back: Neck supple.  Skin:    General: Skin is warm and dry.     Capillary Refill: Capillary refill takes less than 2 seconds.  Neurological:     Mental Status: He is alert.  Psychiatric:        Mood and Affect: Mood normal.      ED Course/ Medical Decision Making/ A&P    Procedures Procedures   Medications Ordered in ED Medications  acetaminophen (TYLENOL) tablet 1,000 mg (1,000 mg Oral Given 03/10/23 1833)    Medical Decision Making:    Theodore [name] is a 89 y.o. male who presented to the ED today with left leg pain detailed above.     Additional history discussed with patient's family/caregivers.   Complete initial physical exam performed, notably the patient  was HDS in NAD.      Reviewed and confirmed nursing documentation for past medical history, family history, social history.    Initial Assessment:   With the patient's presentation of leg pain, most likely diagnosis is MSK etiology, sciatica. Other diagnoses were considered including (but not limited to) underlying orthopedic fracture, DVT. These are considered less likely  due to history of present illness and physical exam findings.   This is most consistent with an acute life/limb threatening illness complicated by underlying chronic conditions.  Initial Plan:  Targeted x-rays of the left hip DVT study left lower extremity Objective evaluation as below reviewed with plan for close reassessment  Initial Study Results:    Radiology  All images reviewed independently. Agree with radiology report at this time.   US Venous Img Lower Unilateral Left Result Date: 03/10/2023 CLINICAL DATA:  Pain EXAM: Left LOWER EXTREMITY VENOUS DOPPLER ULTRASOUND TECHNIQUE: Gray-scale sonography with  compression, as well as color and duplex ultrasound, were performed to evaluate the deep venous system(s) from the level of the common femoral vein through the popliteal and proximal calf veins. COMPARISON:  None Available. FINDINGS: VENOUS Normal compressibility of the common femoral, superficial femoral, and popliteal veins, as well as the visualized calf veins. Visualized portions of profunda femoral vein and great saphenous vein unremarkable. No filling defects to suggest DVT on grayscale or color Doppler imaging. Doppler waveforms show normal direction of venous flow, normal respiratory plasticity and response to augmentation. Limited views of the contralateral common femoral vein are unremarkable. OTHER None. Limitations: none IMPRESSION: Negative. Electronically Signed   By: Minerva Fester M.D.   On: 03/10/2023 19:52   DG HIP UNILAT W OR W/O PELVIS 2-3 VIEWS LEFT Result Date: 03/10/2023 CLINICAL DATA:  Left lower extremity swelling and pain. EXAM: DG HIP (WITH OR WITHOUT PELVIS) 2-3V LEFT COMPARISON:  None Available. FINDINGS: There is no evidence of hip fracture or dislocation. No significant arthropathy identified. No bony lesions or destruction. The visualized bony pelvis is unremarkable. Prior lower lumbar fusion. IMPRESSION: No acute findings. Prior lower lumbar fusion. Electronically Signed   By: Irish Lack M.D.   On: 03/10/2023 18:47     Reassessment and Plan:   No focal pathology on exam.  Symptoms improving with Tylenol.  Stable follow-up in the outpatient setting.  Recommend he follow-up with spine due to his history of lumbar fusion and consistency of presentation with sciatica.  Patient expressed understanding.   Disposition:  I have considered need for hospitalization, however, considering all of the above, I believe this patient is stable for discharge at this time.  Patient/family educated about specific return precautions for given chief complaint and symptoms.   Patient/family educated about follow-up with PCP and spine.     Patient/family expressed understanding of return precautions and need for follow-up. Patient spoken to regarding all imaging and laboratory results and appropriate follow up for these results. All education provided in verbal form with additional information in written form. Time was allowed for answering of patient questions. Patient discharged.    Emergency Department Medication Summary:   Medications  acetaminophen (TYLENOL) tablet 1,000 mg (1,000 mg Oral Given 03/10/23 1833)         Clinical Impression:  1. Left leg pain      Discharge   Final Clinical Impression(s) / ED Diagnoses Final diagnoses:  Left leg pain    Rx / DC Orders ED Discharge Orders     None         Glyn Ade, MD 03/10/23 2136

## 2023-03-10 NOTE — Telephone Encounter (Signed)
Patient is calling back asking to speak with Noreene Larsson. Please advise

## 2023-03-11 ENCOUNTER — Telehealth: Payer: Self-pay

## 2023-03-11 NOTE — Transitions of Care (Post Inpatient/ED Visit) (Signed)
03/11/2023  Name: Theodore [name] MRN: [id number] DOB: 23-Feb-1934  Today's TOC FU Call Status: Today's TOC FU Call Status:: Successful TOC FU Call Completed TOC FU Call Complete Date: 03/11/23 Patient's Name and Date of Birth confirmed.  Transition Care Management Follow-up Telephone Call Date of Discharge: 03/10/23 Discharge Facility: [facility] Type of Discharge: Emergency Department Reason for ED Visit: Other: (left leg pain) How have you been since you were released from the hospital?: Same Any questions or concerns?: Yes  Items Reviewed: Did you receive and understand the discharge instructions provided?: No Medications obtained,verified, and reconciled?: Yes (Medications Reviewed) Any new allergies since your discharge?: No Dietary orders reviewed?: Yes Do you have support at home?: Yes People in Home: spouse  Medications Reviewed Today: Medications Reviewed Today     Reviewed by Karena Addison, LPN (Licensed Practical Nurse) on 03/11/23 at 1140  Med List Status: <None>   Medication Order Taking? Sig Documenting Provider Last Dose Status Informant  acetaminophen (TYLENOL) 500 MG tablet [id number] No Take 1 tablet (500 mg total) by mouth 3 (three) times daily as needed. Eustaquio Boyden, MD Taking Active Self  atorvastatin (LIPITOR) 40 MG tablet [id number] No Take 1 tablet (40 mg total) by mouth daily. Alver Sorrow, NP Taking Active   azithromycin (ZITHROMAX) 500 MG tablet [id number] No TAKE 1 TABLET BY MOUTH 1 HOUR PRIOR TO ANY DENTAL TREATMENTS Filbert Schilder, NP Taking Active   BREZTRI AEROSPHERE 160-9-4.8 MCG/ACT AERO [id number] No USE 2 INHALATIONS BY MOUTH TWICE DAILY Young, Clinton D, MD Taking Active   Cholecalciferol (VITAMIN D3) 2000 units TABS [id number] No Take 2,000 Units by mouth daily. [provider] Taking Active Self  clopidogrel (PLAVIX) 75 MG tablet [id number] No Take 1 tablet (75 mg total) by mouth daily. Georgie Chard D, NP  Taking Active   cyanocobalamin (VITAMIN B12) 500 MCG tablet [id number]  Take 1 tablet (500 mcg total) by mouth every Monday, Wednesday, and Friday. Eustaquio Boyden, MD  Active   ezetimibe (ZETIA) 10 MG tablet [id number] No TAKE 1 TABLET BY MOUTH DAILY Chilton Si, MD Taking Active Self  FARXIGA 10 MG TABS tablet [id number] No TAKE 1 TABLET BY MOUTH DAILY  BEFORE Yvette Rack, MD Taking Active Self  furosemide (LASIX) 20 MG tablet [id number] No Take 1 tablet (20 mg total) by mouth daily. Alver Sorrow, NP Taking Active   gabapentin (NEURONTIN) 300 MG capsule [id number] No Take 900 mg by mouth at bedtime. [provider] Taking Active Self  ipratropium-albuterol (DUONEB) 0.5-2.5 (3) MG/3ML SOLN [id number] No 1 neb every 6 hours as needed Jetty Duhamel D, MD Taking Active Self  metoprolol succinate (TOPROL-XL) 50 MG 24 hr tablet [id number] No Take 50 mg by mouth daily. Take with or immediately following a meal. [provider] Taking Active   pantoprazole (PROTONIX) 40 MG tablet [id number]  Take 1 tablet (40 mg total) by mouth daily. Eustaquio Boyden, MD  Active   potassium chloride (KLOR-CON M) 10 MEQ tablet [id number]  Take 1 tablet (10 mEq total) by mouth daily. Eustaquio Boyden, MD  Active   sacubitril-valsartan [facility]) 24-26 MG [id number] No Take 1 tablet by mouth 2 (two) times daily. Alver Sorrow, NP Taking Active   traZODone (DESYREL) 50 MG tablet [id number] No TAKE 1/2 TO 1 TABLET BY MOUTH AT BEDTIME AS NEEDED FOR SLEEP Eustaquio Boyden, MD Taking Active   vitamin C (ASCORBIC ACID) 500 MG tablet [id number] No Take 500 mg by mouth daily. [provider]  Taking Active Self            Home Care and Equipment/Supplies: Were Home Health Services Ordered?: NA Any new equipment or medical supplies ordered?: NA  Functional Questionnaire: Do you need assistance with bathing/showering or dressing?: No Do you need assistance with meal  preparation?: No Do you need assistance with eating?: No Do you have difficulty maintaining continence: No Do you need assistance with getting out of bed/getting out of a chair/moving?: No Do you have difficulty managing or taking your medications?: No  Follow up appointments reviewed: PCP Follow-up appointment confirmed?: NA Specialist Hospital Follow-up appointment confirmed?: No Reason Specialist Follow-Up Not Confirmed: Patient has Specialist Provider Number and will Call for Appointment Do you need transportation to your follow-up appointment?: No Do you understand care options if your condition(s) worsen?: Yes-patient verbalized understanding    SIGNATURE Karena Addison, LPN [facility] Nurse Health Advisor Direct Dial [phone]

## 2023-03-11 NOTE — Telephone Encounter (Signed)
Patient ended up going to the ER yesterday where an ultrasound was performed. Negative for DVT.

## 2023-03-12 ENCOUNTER — Telehealth: Payer: Self-pay | Admitting: Family Medicine

## 2023-03-12 ENCOUNTER — Encounter: Payer: Self-pay | Admitting: Family Medicine

## 2023-03-12 NOTE — Telephone Encounter (Addendum)
Noted. Pt seen on 03/10/23 at ED. Fyi to Dr Reece Agar.

## 2023-03-12 NOTE — Progress Notes (Signed)
Ph: [phone] Fax: [phone]   Patient ID: Theodore [name], male    DOB: [birth year], 89 y.o.   MRN: [id number]  This visit was conducted in person.  BP 126/70   Pulse 74   Temp 97.8 F (36.6 C) (Oral)   Ht 5' 9.5" (1.765 m)   Wt 195 lb 4 oz (88.6 kg)   SpO2 95%   BMI 28.42 kg/m    CC: ER f/u visit  Subjective:   HPI: Theodore [name] is a 89 y.o. male presenting on 03/13/2023 for Hospitalization Follow-up (Seen on 03/10/23 at [facility] ED, dx L leg pain. C/o ongoing pain- requests vascular referral. )   Known CAD s/p catheterizations atherectomy and stent placement, HFrEF, and afib s/p cardioversions and ablation and most recent watchman procedure 09/2022. He is now off xarelto, just on plavix. Planning to go off plavix in 2 weeks and just conitnue aspirin 81mg  daily.   Seen at [facility] ER on Tuesday 03/10/2023 with L leg pain worse with walking up stairs, described as pain down left posterior thigh into knee.  ER records reviewed: Pelvic xray - no significant hip arthropathy LLE venous US - no DVT  Thought related to sciatica rec spine doc f/u.   Over the past 7-10 days notes worsening L leg pain, this morning noted L foot was blue. Has had 2 knee surgeries. No rest pain, no leg pain with walking (and no claudication). Significant pain to upper leg with radiation down to mid lower leg only going up steps. Hasn't tried anything for this pain. Denies inciting trauma/injury or falls  Known neuropathy managed with gabapentin 900mg  nightly.   ABIs 08/2019 - noncompressible BLE arteries, normal TBIs bilaterally, no significant obstructive disease.   H/o ortho surgeries: Cervical spine fusion C3/4 2011 Lumbar spine fusion L4/5 2010 Wynetta Emery) L knee arthroscopy 2018 Charlann Boxer) L patellar tendon repair 2008  R total knee replacement 2006 L total knee replacement 01/2017     Relevant past medical, surgical, family and social history reviewed and updated as indicated.  Interim medical history since our last visit reviewed. Allergies and medications reviewed and updated. Outpatient Medications Prior to Visit  Medication Sig Dispense Refill   acetaminophen (TYLENOL) 500 MG tablet Take 1 tablet (500 mg total) by mouth 3 (three) times daily as needed. 30 tablet 0   atorvastatin (LIPITOR) 40 MG tablet Take 1 tablet (40 mg total) by mouth daily. 90 tablet 3   azithromycin (ZITHROMAX) 500 MG tablet TAKE 1 TABLET BY MOUTH 1 HOUR PRIOR TO ANY DENTAL TREATMENTS 4 tablet 0   BREZTRI AEROSPHERE 160-9-4.8 MCG/ACT AERO USE 2 INHALATIONS BY MOUTH TWICE DAILY 32.1 g 3   Cholecalciferol (VITAMIN D3) 2000 units TABS Take 2,000 Units by mouth daily.     clopidogrel (PLAVIX) 75 MG tablet Take 1 tablet (75 mg total) by mouth daily. 90 tablet 1   cyanocobalamin (VITAMIN B12) 500 MCG tablet Take 1 tablet (500 mcg total) by mouth every Monday, Wednesday, and Friday.     ezetimibe (ZETIA) 10 MG tablet TAKE 1 TABLET BY MOUTH DAILY 90 tablet 3   FARXIGA 10 MG TABS tablet TAKE 1 TABLET BY MOUTH DAILY  BEFORE BREAKFAST 90 tablet 3   furosemide (LASIX) 20 MG tablet Take 1 tablet (20 mg total) by mouth daily. 90 tablet 3   gabapentin (NEURONTIN) 300 MG capsule Take 900 mg by mouth at bedtime.     ipratropium-albuterol (DUONEB) 0.5-2.5 (3) MG/3ML SOLN 1 neb every  6 hours as needed 360 mL 12   metoprolol succinate (TOPROL-XL) 50 MG 24 hr tablet Take 50 mg by mouth daily. Take with or immediately following a meal.     pantoprazole (PROTONIX) 40 MG tablet Take 1 tablet (40 mg total) by mouth daily. 90 tablet 4   potassium chloride (KLOR-CON M) 10 MEQ tablet Take 1 tablet (10 mEq total) by mouth daily.     sacubitril-valsartan (ENTRESTO) 24-26 MG Take 1 tablet by mouth 2 (two) times daily. 180 tablet 3   traZODone (DESYREL) 50 MG tablet TAKE 1/2 TO 1 TABLET BY MOUTH AT BEDTIME AS NEEDED FOR SLEEP 90 tablet 3   vitamin C (ASCORBIC ACID) 500 MG tablet Take 500 mg by mouth daily.     No  facility-administered medications prior to visit.     Per HPI unless specifically indicated in ROS section below Review of Systems  Objective:  BP 126/70   Pulse 74   Temp 97.8 F (36.6 C) (Oral)   Ht 5' 9.5" (1.765 m)   Wt 195 lb 4 oz (88.6 kg)   SpO2 95%   BMI 28.42 kg/m   Wt Readings from Last 3 Encounters:  03/13/23 195 lb 4 oz (88.6 kg)  03/10/23 195 lb (88.5 kg)  03/02/23 198 lb 4 oz (89.9 kg)      Physical Exam Vitals and nursing note reviewed.  Constitutional:      Appearance: Normal appearance. He is not ill-appearing.  Cardiovascular:     Rate and Rhythm: Normal rate. Rhythm irregular.     Pulses: Normal pulses.     Heart sounds: Normal heart sounds. No murmur heard. Pulmonary:     Effort: Pulmonary effort is normal. No respiratory distress.     Breath sounds: Normal breath sounds. No wheezing, rhonchi or rales.  Musculoskeletal:        General: Tenderness present. No swelling.     Right lower leg: No edema.     Left lower leg: No edema.     Comments:  2+ DP/PT on right 1+ DP on left 2+ PT on left  Mild tenderness to palpation of anterior left proximal femur  No groin pain with rotation of left hip in joint  Left knee exam: S/p knee replacement with post-op scar No significant pain with palpation of knee landmarks. No effusion/swelling noted. FROM in flex/extension without crepitus. No popliteal fullness. Neg drawer test. Neg mcmurray test. No pain with valgus/varus stress. Mild discomfort with PFgrind. No abnormal patellar mobility.   Skin:    General: Skin is warm and dry.     Capillary Refill: Capillary refill takes 2 to 3 seconds.     Findings: No rash.     Comments:  Diminished cap refill noted to RIGHT foot Purplish hue to bilateral feet in known neuropathy  Neurological:     Mental Status: He is alert.  Psychiatric:        Mood and Affect: Mood normal.        Behavior: Behavior normal.        Assessment & Plan:   Problem List  Items Addressed This Visit     Acute leg pain, left - Primary   Discussed description of pain is more consistent with musculoskeletal pain than arterial as no claudication, no rest pain, pain only present when going up stairs. Points to left anterior thigh as source of pain, with radiation down to lower left leg past knee. Provided with exercises on patellofemoral pain syndrome from  sports medicine patient advisor book to try.  On exam he does have discoloration to feet and delayed capillary refill on RIGHT and diminished DP on LEFT. Known periph neuropathy managed with gabapentin 900mg  nightly . H/o noncompressible arteries on ABI 2021. TBI normal, no obvious occlusion at that time.  Given pt concern for arterial source, and planning to come off plavix, reasonable to pursue PAD evaluation - will refer to vascular surgeons for further evaluation.  Check left knee and femur xrays today.       Relevant Orders   DG Knee Complete 4 Views Left   DG FEMUR MIN 2 VIEWS LEFT   Ambulatory referral to Vascular Surgery     No orders of the defined types were placed in this encounter.   Orders Placed This Encounter  Procedures   DG Knee Complete 4 Views Left    Standing Status:   Future    Expiration Date:   03/12/2024    Reason for Exam (SYMPTOM  OR DIAGNOSIS REQUIRED):   L knee pain with going up steps, h/o knee replacement    Preferred imaging location?:   [place]   DG FEMUR MIN 2 VIEWS LEFT    Standing Status:   Future    Number of Occurrences:   1    Expiration Date:   03/12/2024    Reason for Exam (SYMPTOM  OR DIAGNOSIS REQUIRED):   left mid femur pain with going up steps    Preferred imaging location?:   [facility]   Ambulatory referral to Vascular Surgery    Referral Priority:   Routine    Referral Type:   Surgical    Referral Reason:   Specialty Services Required    Requested Specialty:   Vascular Surgery    Number of Visits Requested:   1    Patient  Instructions  Knee and femur xray today  We will refer you to vascular doctor for artery evaluation prior to coming off plavix - refilled for now.  If normal, recommend follow up with ortho Charlann Boxer) - let me know if you need new referral.   Follow up plan: Return if symptoms worsen or fail to improve.  Eustaquio Boyden, MD

## 2023-03-12 NOTE — Telephone Encounter (Signed)
Noted. Will see tomorrow.

## 2023-03-12 NOTE — Telephone Encounter (Signed)
Spoke with patient and he stated that he is fine going to [facility] or [place] in regards to this referral. Schedule an appointment for tomorrow morning since he never discussed with Dr. Reece Agar.    Copied from CRM [id number]. Topic: Referral - Request for Referral >> Mar 12, 2023 10:18 AM Corin V wrote: Did the patient discuss referral with their provider in the last year? No- was seen in ER (If No - schedule appointment) (If Yes - send message)  Appointment offered? No  Type of order/referral and detailed reason for visit: Wants to see a vascular specialist for ongoing issues with his left knee and he is worried he has a blockage. He wants a referral for as soon as possible since it is affecting his ability to walk and go up stairs and he is worried about it forming a blood clot. He has had 2 major knee surgeries and is worried that it may be a complication.  Preference of office, provider, location: first available  If referral order, have you been seen by this specialty before? Yes-unsure (If Yes, this issue or another issue? When? Where?  Can we respond through MyChart? Yes

## 2023-03-13 ENCOUNTER — Ambulatory Visit
Admission: RE | Admit: 2023-03-13 | Discharge: 2023-03-13 | Disposition: A | Discharge status: Home / Self Care | Payer: Medicare Other | Source: Ambulatory Visit | Attending: Family Medicine | Admitting: Family Medicine

## 2023-03-13 ENCOUNTER — Encounter: Payer: Self-pay | Admitting: Family Medicine

## 2023-03-13 ENCOUNTER — Ambulatory Visit: Payer: Medicare Other | Admitting: Family Medicine

## 2023-03-13 VITALS — BP 126/70 | HR 74 | Temp 97.8°F | Ht 69.5 in | Wt 195.2 lb

## 2023-03-13 DIAGNOSIS — M79605 Pain in left leg: Secondary | ICD-10-CM | POA: Diagnosis not present

## 2023-03-13 NOTE — Assessment & Plan Note (Signed)
Discussed description of pain is more consistent with musculoskeletal pain than arterial as no claudication, no rest pain, pain only present when going up stairs. Points to left anterior thigh as source of pain, with radiation down to lower left leg past knee. Provided with exercises on patellofemoral pain syndrome from sports medicine patient advisor book to try.  On exam he does have discoloration to feet and delayed capillary refill on RIGHT and diminished DP on LEFT. Known periph neuropathy managed with gabapentin 900mg  nightly . H/o noncompressible arteries on ABI 2021. TBI normal, no obvious occlusion at that time.  Given pt concern for arterial source, and planning to come off plavix, reasonable to pursue PAD evaluation - will refer to vascular surgeons for further evaluation.  Check left knee and femur xrays today.

## 2023-03-13 NOTE — Patient Instructions (Signed)
Knee and femur xray today  We will refer you to vascular doctor for artery evaluation prior to coming off plavix - refilled for now.  If normal, recommend follow up with ortho Charlann Boxer) - let me know if you need new referral.

## 2023-03-17 ENCOUNTER — Other Ambulatory Visit: Payer: Self-pay | Admitting: *Deleted

## 2023-03-17 DIAGNOSIS — M79605 Pain in left leg: Secondary | ICD-10-CM

## 2023-03-21 ENCOUNTER — Other Ambulatory Visit: Payer: Self-pay | Admitting: Cardiology

## 2023-03-24 ENCOUNTER — Encounter (HOSPITAL_COMMUNITY): Payer: Self-pay

## 2023-03-25 ENCOUNTER — Ambulatory Visit: Payer: Medicare Other

## 2023-03-26 ENCOUNTER — Other Ambulatory Visit: Payer: Self-pay | Admitting: Family Medicine

## 2023-03-26 ENCOUNTER — Encounter: Admission: RE | Admit: 2023-03-26 | Payer: Medicare Other | Source: Ambulatory Visit

## 2023-03-26 ENCOUNTER — Encounter: Payer: Self-pay | Admitting: Family Medicine

## 2023-03-26 DIAGNOSIS — M8580 Other specified disorders of bone density and structure, unspecified site: Secondary | ICD-10-CM | POA: Insufficient documentation

## 2023-03-26 NOTE — Progress Notes (Signed)
[facility]                                     Cardiology Office Note:    Date:  03/30/2023   ID:  Theodore [name], DOB 1934/09/12, MRN [id number]  PCP:  Eustaquio Boyden, MD  [facility] Cardiologist:  Chilton Si, MD  [facility] Electrophysiologist:  Will Jorja Loa, MD   Referring MD: Eustaquio Boyden, MD   Chief Complaint  Patient presents with   6 month s/p LAAO    History of Present Illness:    Theodore [name] is a 89 y.o. male with a hx of CAD s/p PCI, HTN, HLD, neuropathy, RBBB, NICM with normalization, and atrial fibrillation s/p ablation 2017 with recent AF recurrence. He was restarted on Xarelto however has a hx of epistaxis while on anticoagulation. Given this, he was referred to Dr. Lalla Brothers for potential LAAO with Watchman.    [name] was seen in consultation 07/08/22 and was felt to be a good candidate to proceed. CTA 6/5 with anatomy suitable to proceed. She is now s/p LAAO with Watchman 27mm FLX Pro device 09/24/22. He has since been transitioned to Plavix and has tolerated this well. Today he is here alone and reports he has been well. He denies chest pain, SOB, palpitations, LE edema, orthopnea, PND, dizziness, or syncope. Denies bleeding in stool or urine. He does report some mild discomfort in his left LE with ambulation and will seen [facility]VS tomorrow for evaluation.   Past Medical History:  Diagnosis Date   Arthritis Years   Atypical atrial flutter (HCC) 12/31/2021   Blood transfusion without reported diagnosis Years ago   CAP (community acquired pneumonia) 01/19/2018   Cataract 1992   CHF (congestive heart failure) (HCC) Years ago   Emphysema of lung (HCC) 2014   GERD (gastroesophageal reflux disease) Years   Heart murmur Years ago   History of arthritis    History of rheumatic fever    Hypercholesterolemia    Hypertension Years ago   Nocturia    PAF (paroxysmal atrial fibrillation) (HCC) 02/2011   Placed on Pradaxa. Did not  require cardioversion; spontaneously converted   Peripheral edema    Persistent atrial fibrillation (HCC) 01/21/2011   S/p ablation 12/2015 NSR up until his PCI Dec 2020 when he went into AF post PCI   Presence of Watchman left atrial appendage closure device 09/24/2022   27mm Watchman FLX Pro placed by Dr. 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   at least in situ   Tendonitis of elbow, left     Past Surgical History:  Procedure Laterality Date   ATRIAL FIBRILLATION ABLATION  12/19/2015   BACK SURGERY  12/24/2009   fusion C3-C4   BACK SURGERY  2010   fusion L4-L5 Wynetta Emery)   CARDIAC CATHETERIZATION  2009   NONOBSTRUCTIVE ATHERSCLEROTIC CORONARY DISEASE AND NORMAL  LV FUNCTION   CARDIAC CATHETERIZATION N/A 09/08/2014   Procedure: Left Heart Cath and Coronary Angiography;  Surgeon: Lyn Records, MD;  Location: [facility] INVASIVE CV LAB;  Service: Cardiovascular;  Laterality: N/A;   CARDIAC CATHETERIZATION N/A 09/28/2015   Procedure: Left Heart Cath and Coronary Angiography;  Surgeon: Marykay Lex, MD;  Location: [facility] INVASIVE CV LAB;  Service: Cardiovascular;  Laterality: N/A;   CARDIAC CATHETERIZATION N/A  09/28/2015   Procedure: Intravascular Pressure Wire/FFR Study;  Surgeon: Marykay Lex, MD;  Location: [facility] INVASIVE CV LAB;  Service: Cardiovascular;  Laterality: N/A;   CARDIOVERSION N/A 10/23/2015   Procedure: CARDIOVERSION;  Surgeon: Lewayne Bunting, MD;  Location: [facility] ENDOSCOPY;  Service: Cardiovascular;  Laterality: N/A;   CARDIOVERSION N/A 01/01/2022   Procedure: CARDIOVERSION;  Surgeon: Meriam Sprague, MD;  Location: [facility] ENDOSCOPY;  Service: Cardiovascular;  Laterality: N/A;   COLONOSCOPY  2013   per patient, rpt 5 yrs   COLONOSCOPY WITH PROPOFOL N/A 12/02/2018   TAx2, HP, diverticulosis Carman Ching, MD)   CORONARY ATHERECTOMY N/A 01/11/2019   Procedure: CORONARY ATHERECTOMY;  Surgeon: Kathleene Hazel, MD;  Location: [facility] INVASIVE CV LAB;  Service: Cardiovascular;  Laterality: N/A;   CORONARY PRESSURE/FFR STUDY N/A 01/10/2019   Procedure: INTRAVASCULAR PRESSURE WIRE/FFR STUDY;  Surgeon: Marykay Lex, MD;  Location: [facility] INVASIVE CV LAB;  Service: Cardiovascular;  Laterality: N/A;   CORONARY STENT INTERVENTION N/A 01/11/2019   Procedure: CORONARY STENT INTERVENTION;  Surgeon: Kathleene Hazel, MD;  Location: [facility] INVASIVE CV LAB;  Service: Cardiovascular;  Laterality: N/A;   ELECTROPHYSIOLOGIC STUDY N/A 12/19/2015   Procedure: Atrial Fibrillation Ablation;  Surgeon: Will Jorja Loa, MD;  Location: [facility] INVASIVE CV LAB;  Service: Cardiovascular;  Laterality: N/A;   ESOPHAGOGASTRODUODENOSCOPY (EGD) WITH PROPOFOL N/A 12/02/2018   medium HH Carman Ching, MD)   EYE SURGERY  1992   KNEE ARTHROSCOPY Left 03/2016   Dr. Charlann Boxer   LEFT ATRIAL APPENDAGE OCCLUSION N/A 09/24/2022   Procedure: LEFT ATRIAL APPENDAGE OCCLUSION;  Surgeon: Lanier Prude, MD;  Location: [facility] INVASIVE CV LAB;  Service: Cardiovascular;  Laterality: N/A;   LEFT HEART CATH AND CORONARY ANGIOGRAPHY N/A 01/10/2019   Procedure: LEFT HEART CATH AND CORONARY ANGIOGRAPHY;  Surgeon: Marykay Lex, MD;  Location: [facility] INVASIVE CV LAB;  Service: Cardiovascular;  Laterality: N/A;   PATELLAR TENDON REPAIR Left 2008   POLYPECTOMY  12/02/2018   Procedure: POLYPECTOMY;  Surgeon: Carman Ching, MD;  Location: [facility] ENDOSCOPY;  Service: Endoscopy;;   TEE WITHOUT CARDIOVERSION N/A 09/24/2022   Procedure: TRANSESOPHAGEAL ECHOCARDIOGRAM;  Surgeon: Lanier Prude, MD;  Location: [facility] INVASIVE CV LAB;  Service: Cardiovascular;  Laterality: N/A;   TOTAL KNEE ARTHROPLASTY Right 2006   TOTAL KNEE ARTHROPLASTY Left 02/09/2017   Procedure: LEFT TOTAL KNEE ARTHROPLASTY, EXCISION LEFT DISTAL THIGH MASS;  Surgeon: Durene Romans, MD;  Location: [facility] ORS;  Service: Orthopedics;  Laterality: Left;  90 mins   TRICEPS TENDON REPAIR Left  2013    Current Medications: Current Meds  Medication Sig   acetaminophen (TYLENOL) 500 MG tablet Take 1 tablet (500 mg total) by mouth 3 (three) times daily as needed.   aspirin EC 81 MG tablet Take 1 tablet (81 mg total) by mouth daily. Swallow whole.   atorvastatin (LIPITOR) 40 MG tablet Take 1 tablet (40 mg total) by mouth daily.   BREZTRI AEROSPHERE 160-9-4.8 MCG/ACT AERO USE 2 INHALATIONS BY MOUTH TWICE DAILY   Cholecalciferol (VITAMIN D3) 2000 units TABS Take 2,000 Units by mouth daily.   cyanocobalamin (VITAMIN B12) 500 MCG tablet Take 1 tablet (500 mcg total) by mouth every Monday, Wednesday, and Friday.   ezetimibe (ZETIA) 10 MG tablet TAKE 1 TABLET BY MOUTH DAILY   FARXIGA 10 MG TABS tablet TAKE 1 TABLET BY MOUTH DAILY  BEFORE BREAKFAST   furosemide (LASIX) 20 MG tablet Take 1 tablet (20 mg total) by mouth daily.   gabapentin (NEURONTIN)  300 MG capsule Take 900 mg by mouth at bedtime.   ipratropium-albuterol (DUONEB) 0.5-2.5 (3) MG/3ML SOLN 1 neb every 6 hours as needed   metoprolol succinate (TOPROL-XL) 50 MG 24 hr tablet Take 50 mg by mouth daily. Take with or immediately following a meal.   pantoprazole (PROTONIX) 40 MG tablet Take 1 tablet (40 mg total) by mouth daily.   potassium chloride (KLOR-CON M) 10 MEQ tablet Take 1 tablet (10 mEq total) by mouth daily.   sacubitril-valsartan (ENTRESTO) 24-26 MG Take 1 tablet by mouth 2 (two) times daily.   traZODone (DESYREL) 50 MG tablet TAKE 1/2 TO 1 TABLET BY MOUTH AT BEDTIME AS NEEDED FOR SLEEP   vitamin C (ASCORBIC ACID) 500 MG tablet Take 500 mg by mouth daily.   [DISCONTINUED] azithromycin (ZITHROMAX) 500 MG tablet TAKE 1 TABLET BY MOUTH 1 HOUR PRIOR TO ANY DENTAL TREATMENTS   [DISCONTINUED] clopidogrel (PLAVIX) 75 MG tablet TAKE 1 TABLET BY MOUTH DAILY     Allergies:   Penicillins, Amlodipine, Effexor [venlafaxine], Lisinopril, Mevacor [lovastatin], Vicodin [hydrocodone-acetaminophen], Zocor [simvastatin], and Tamiflu  [oseltamivir phosphate]   Social History   Socioeconomic History   Marital status: Married    Spouse name: Not on file   Number of children: Not on file   Years of education: Not on file   Highest education level: Master's degree (e.g., MA, MS, MEng, MEd, MSW, MBA)  Occupational History   Occupation: retired    Associate Professor: RETIRED    Comment: Acupuncturist  Tobacco Use   Smoking status: Never   Smokeless tobacco: Never   Tobacco comments:    Never smoke 01/13/22  Vaping Use   Vaping status: Never Used  Substance and Sexual Activity   Alcohol use: Yes    Alcohol/week: 10.0 standard drinks of alcohol    Types: 10 Cans of beer per week    Comment: 2 beers daily 01/13/22   Drug use: No   Sexual activity: Not Currently  Other Topics Concern   Not on file  Social History Narrative   Lives with wife, no pets   Retired   Research scientist (medical): Acupuncturist   Edu: master's   Activity: golf   Diet: good water, fruits/vegetables daily   Social Drivers of Corporate investment banker Strain: Low Risk  (01/20/2023)   Overall Financial Resource Strain (CARDIA)    Difficulty of Paying Living Expenses: Not hard at all  Food Insecurity: No Food Insecurity (01/20/2023)   Hunger Vital Sign    Worried About Running Out of Food in the Last Year: Never true    Ran Out of Food in the Last Year: Never true  Transportation Needs: No Transportation Needs (01/20/2023)   PRAPARE - Administrator, Civil Service (Medical): No    Lack of Transportation (Non-Medical): No  Physical Activity: Inactive (01/20/2023)   Exercise Vital Sign    Days of Exercise per Week: 0 days    Minutes of Exercise per Session: 0 min  Stress: No Stress Concern Present (01/20/2023)   [employer] of Occupational Health - Occupational Stress Questionnaire    Feeling of Stress : Only a little  Social Connections: Moderately Integrated (01/20/2023)   Social Connection and Isolation Panel [NHANES]     Frequency of Communication with Friends and Family: More than three times a week    Frequency of Social Gatherings with Friends and Family: Once a week    Attends Religious Services: 1 to 4 times per year  Active Member of Clubs or Organizations: No    Attends Banker Meetings: Never    Marital Status: Married     Family History: The patient's family history includes Asthma in his sister; COPD in his brother and father; Congestive Heart Failure (age of onset: 51) in his father; Heart disease in his brother, sister, and sister; Hypertension in his father. There is no history of Stroke, Colon cancer, Stomach cancer, or CAD.  ROS:   Please see the history of present illness.    All other systems reviewed and are negative.  EKGs/Labs/Other Studies Reviewed:    The following studies were reviewed today:   Cardiac Studies & Procedures   ______________________________________________________________________________________________ CARDIAC CATHETERIZATION  CARDIAC CATHETERIZATION 01/11/2019  Narrative  Prox LAD to Mid LAD lesion is 80% stenosed.  A drug-eluting stent was successfully placed using a STENT RESOLUTE ONYX 3.5X18.  Post intervention, there is a 0% residual stenosis.  Mid LAD lesion is 70% stenosed.  A drug-eluting stent was successfully placed using a STENT RESOLUTE ONYX 2.75X26.  Post intervention, there is a 0% residual stenosis.  1. Severe, heavily calcified stenoses in the proximal and mid LAD 2. Successful orbital atherectomy of the proximal and mid LAD 3. Successful placement of 2 drug eluting stents in the proximal and mid LAD (overlapping)  Recommendations: Continue ASA and Plavix for at least six months.  Findings Coronary Findings Diagnostic  Dominance: Right  Left Anterior Descending Prox LAD to Mid LAD lesion is 80% stenosed. The lesion is calcified. Mid LAD lesion is 70% stenosed. The lesion is calcified.  Intervention  Prox LAD  to Mid LAD lesion Stent CATH VISTA GUIDE 6FR XBLAD3.5 guide catheter was inserted. Lesion crossed with guidewire using a WIRE COUGAR XT STRL 190CM. Pre-stent angioplasty was performed using a BALLOON SAPPHIRE 2.5X20. A drug-eluting stent was successfully placed using a STENT RESOLUTE ONYX 3.5X18. Stent strut is well apposed. Post-stent angioplasty was performed using a BALLOON SAPPHIRE [place] 3.75X8. Post-Intervention Lesion Assessment The intervention was successful. Pre-interventional TIMI flow is 3. Post-intervention TIMI flow is 3. No complications occurred at this lesion. There is a 0% residual stenosis post intervention.  Mid LAD lesion Stent Pre-stent angioplasty was performed using a BALLOON SAPPHIRE 2.5X20. A drug-eluting stent was successfully placed using a STENT RESOLUTE ONYX 2.75X26. Stent strut is well apposed. Post-stent angioplasty was performed using a BALLOON SAPPHIRE [place] 3.0X8. Post-Intervention Lesion Assessment The intervention was successful. Pre-interventional TIMI flow is 3. Post-intervention TIMI flow is 3. No complications occurred at this lesion. There is a 0% residual stenosis post intervention.   CARDIAC CATHETERIZATION 01/10/2019  Narrative  Prox LAD lesion is 80% stenosed. Prox LAD to Mid LAD lesion is 60% stenosed with 50% stenosed side branch in 2nd Diag.  Dist LAD lesion is 45% stenosed.  Ost Cx to Prox Cx lesion is 10% stenosed. 1st Mrg lesion is 30% stenosed. Prox Cx to Mid Cx lesion is 30% stenosed.  -----------  Dist RCA lesion is 30% stenosed with 30% stenosed side branch in RPDA.  -----------  The left ventricular systolic function is normal. The left ventricular ejection fraction is 55-65% by visual estimate.  LV end diastolic pressure is moderately elevated.  SUMMARY  Diffuse calcified coronary disease with severe single-vessel disease involving the proximal LAD -> 75 to 80% followed by extensive 50% stenosis that is DFR positive at 0.82.   This represents significant progression of disease from 2017  Otherwise mild to moderate disease.  Normal LVEF with moderately  elevated LVEDP.   RECOMMENDATIONS  With proximal LAD disease, heavily calcified, this will require atherectomy PCI.  Given the issues with holding Xarelto, and COVID-19 screening test, I feel the best course of action is to place the patient in the hospital tonight and plan for atherectomy tomorrow with Dr. Verne Carrow. ->  We will load Plavix today 300 mg daily, along with aspirin 324 mg.  Would likely be L to restart Xarelto tomorrow night  Continue other home medications.    Bryan Lemma, M.D., M.S. Interventional Cardiologist  Pager # [phone] Phone # [phone] [address] [place], [place] [address]  Findings Coronary Findings Diagnostic  Dominance: Right  Left Main Vessel is large. The vessel exhibits minimal luminal irregularities. The vessel is mildly calcified.  Left Anterior Descending Prox LAD lesion is 80% stenosed. The lesion is type C, located proximal to the major branch, eccentric and concentric. The lesion is severely calcified. Pressure wire/FFR was performed on the lesion. DFR 0.82 Prox LAD to Mid LAD lesion is 60% stenosed with 50% stenosed side branch in 2nd Diag. The lesion is type C, tubular and eccentric. The lesion is calcified. Dist LAD lesion is 45% stenosed. The lesion is tubular and smooth. With some spasm component responsive to nitroglycerin  First Diagonal Branch Vessel is small in size.  Second Diagonal Branch Vessel is moderate in size.  Lateral Second Diagonal Branch Vessel is small in size.  Left Circumflex Small vessel after the OM takeoff. Ost Cx to Prox Cx lesion is 10% stenosed. The lesion is mildly calcified. Prox Cx to Mid Cx lesion is 30% stenosed.  First Obtuse Marginal Branch Vessel is small in size. Is a very small proximal branch occlusion. 1st Mrg lesion is 30%  stenosed.  Second Obtuse Marginal Branch Vessel is small in size.  First Left Posterolateral Branch Vessel is small in size.  Left Posterior Atrioventricular Artery Vessel is small in size.  Right Coronary Artery Vessel is large. There is mild diffuse disease throughout the vessel. Dist RCA lesion is 30% stenosed with 30% stenosed side branch in RPDA.  Acute Marginal Branch Vessel is small in size. Vessel is angiographically normal.  Right Ventricular Branch Vessel is small in size.  Right Posterior Descending Artery Vessel is moderate in size.  Right Posterior Atrioventricular Artery Vessel is small in size.  First Right Posterolateral Branch Vessel is small in size.  Second Right Posterolateral Branch  Third Right Posterolateral Branch Vessel is small in size.  Intervention  No interventions have been documented.   STRESS TESTS  MYOCARDIAL PERFUSION IMAGING 03/11/2022  Narrative   Findings are consistent with no ischemia. The study is intermediate risk.   No ST deviation was noted.   LV perfusion is abnormal. There is no evidence of ischemia. There is no evidence of infarction. Defect 1: There is a medium defect with moderate reduction in uptake present in the apical to mid apex location(s) that is fixed. There is abnormal wall motion in the defect area. Consistent with artifact caused by diaphragmatic attenuation.   Left ventricular function is abnormal. Global function is moderately reduced. Nuclear stress EF: 43 %. The left ventricular ejection fraction is moderately decreased (30-44%). End diastolic cavity size is mildly enlarged. End systolic cavity size is mildly enlarged.   Prior study available for comparison from 08/11/2014.  Abnormal, intermediate risk stress nuclear study with inferior apical thinning versus prior infarct but no ischemia; gated ejection fraction 43% with global hypokinesis and mild left ventricular  enlargement.  Study interpreted as  intermediate risk due to reduced LV function.  Suggest echocardiogram to better assess LV function.   ECHOCARDIOGRAM  ECHOCARDIOGRAM COMPLETE 05/13/2022  Narrative ECHOCARDIOGRAM REPORT    Patient Name:   Theodore [name] Date of Exam: 05/13/2022 Medical Rec #:  [id number]       Height:       71.0 in Accession #:    [id number]      Weight:       194.4 lb Date of Birth:  08-16-1934        BSA:          2.083 m Patient Age:    87 years        BP:           110/60 mmHg Patient Gender: M               HR:           61 bpm. Exam Location:  Outpatient  Procedure: 3D Echo, 2D Echo, Cardiac Doppler, Color Doppler and Strain Analysis  Indications:    CHF  History:        Patient has prior history of Echocardiogram examinations, most recent 03/03/2022. CAD, COPD, Arrythmias:RBBB, PVC and Atrial Flutter; Risk Factors:Hypertension, Dyslipidemia and Non-Smoker.  Sonographer:    Jeryl Columbia RDCS Referring Phys: [id number] CAITLIN S WALKER  IMPRESSIONS   1. Left ventricular ejection fraction, by estimation, is 30 to 35%. Left ventricular ejection fraction by 3D volume is 35 %. The left ventricle has moderately decreased function. The left ventricle demonstrates global hypokinesis. The left ventricular internal cavity size was mildly dilated. There is mild left ventricular hypertrophy. Left ventricular diastolic parameters are indeterminate. 2. Right ventricular systolic function is low normal. The right ventricular size is mildly enlarged. 3. Left atrial size was mildly dilated. 4. Right atrial size was moderately dilated. 5. The mitral valve is abnormal. Trivial mitral valve regurgitation. 6. The aortic valve is tricuspid. Aortic valve regurgitation is mild. Aortic valve sclerosis/calcification is present, without any evidence of aortic stenosis. Aortic valve mean gradient measures 8.0 mmHg. 7. The inferior vena cava is normal in size with greater than 50% respiratory variability, suggesting right  atrial pressure of 3 mmHg. 8. Rhythm strip during this exam demonstrates frequent PVC's.  Comparison(s): No significant change from prior study. 03/03/2022: LVEF 30-35%, global hypokinesis.  FINDINGS Left Ventricle: Left ventricular ejection fraction, by estimation, is 30 to 35%. Left ventricular ejection fraction by 3D volume is 35 %. The left ventricle has moderately decreased function. The left ventricle demonstrates global hypokinesis. The left ventricular internal cavity size was mildly dilated. There is mild left ventricular hypertrophy. Left ventricular diastolic parameters are indeterminate.  Right Ventricle: The right ventricular size is mildly enlarged. No increase in right ventricular wall thickness. Right ventricular systolic function is low normal.  Left Atrium: Left atrial size was mildly dilated.  Right Atrium: Right atrial size was moderately dilated.  Pericardium: There is no evidence of pericardial effusion.  Mitral Valve: The mitral valve is abnormal. There is mild thickening of the anterior and posterior mitral valve leaflet(s). Mild mitral annular calcification. Trivial mitral valve regurgitation.  Tricuspid Valve: The tricuspid valve is grossly normal. Tricuspid valve regurgitation is trivial.  Aortic Valve: The aortic valve is tricuspid. Aortic valve regurgitation is mild. Aortic regurgitation PHT measures 795 msec. Aortic valve sclerosis/calcification is present, without any evidence of aortic stenosis. Aortic valve mean gradient measures 8.0 mmHg. Aortic valve peak gradient measures  15.5 mmHg. Aortic valve area, by VTI measures 0.71 cm.  Pulmonic Valve: The pulmonic valve was grossly normal. Pulmonic valve regurgitation is trivial.  Aorta: The aortic root and ascending aorta are structurally normal, with no evidence of dilitation.  Venous: The inferior vena cava is normal in size with greater than 50% respiratory variability, suggesting right atrial pressure of 3  mmHg.  IAS/Shunts: No atrial level shunt detected by color flow Doppler.  EKG: Rhythm strip during this exam demonstrates frequent PVC's.   LEFT VENTRICLE PLAX 2D LVIDd:         5.89 cm         Diastology LVIDs:         4.58 cm         LV e' medial:    4.95 cm/s LV PW:         1.99 cm         LV E/e' medial:  7.1 LV IVS:        1.13 cm         LV e' lateral:   4.88 cm/s LVOT diam:     2.00 cm         LV E/e' lateral: 7.2 LV SV:         35 LV SV Index:   17 LVOT Area:     3.14 cm        3D Volume EF LV 3D EF:    Left ventricul ar ejection fraction by 3D volume is 35 %.  3D Volume EF: 3D EF:        35 % LV EDV:       194 ml LV ESV:       127 ml LV SV:        67 ml  RIGHT VENTRICLE RV Basal diam:  5.04 cm RV Mid diam:    4.56 cm RV S prime:     10.10 cm/s TAPSE (M-mode): 2.9 cm  LEFT ATRIUM           Index LA diam:      5.80 cm 2.78 cm/m LA Vol (A4C): 63.6 ml 30.53 ml/m AORTIC VALVE AV Area (Vmax):    0.73 cm AV Area (Vmean):   0.71 cm AV Area (VTI):     0.71 cm AV Vmax:           197.00 cm/s AV Vmean:          133.000 cm/s AV VTI:            0.493 m AV Peak Grad:      15.5 mmHg AV Mean Grad:      8.0 mmHg LVOT Vmax:         45.60 cm/s LVOT Vmean:        30.000 cm/s LVOT VTI:          0.111 m LVOT/AV VTI ratio: 0.23 AI PHT:            795 msec  AORTA Ao Root diam: 3.50 cm Ao Asc diam:  3.10 cm  MITRAL VALVE MV Area (PHT): 2.37 cm    SHUNTS MV Decel Time: 320 msec    Systemic VTI:  0.11 m MR Peak grad: 26.8 mmHg    Systemic Diam: 2.00 cm MR Vmax:      259.00 cm/s MV E velocity: 35.10 cm/s MV A velocity: 47.70 cm/s MV E/A ratio:  0.74  Zoila Shutter MD Electronically signed by Zoila Shutter MD Signature Date/Time: 05/13/2022/3:26:51 PM  Final   TEE  ECHO TEE 09/24/2022  Narrative TRANSESOPHOGEAL ECHO REPORT    Patient Name:   Theodore [name] Date of Exam: 09/24/2022 Medical Rec #:  [id number]       Height:       70.0 in Accession  #:    [id number]      Weight:       198.6 lb Date of Birth:  1934-07-26        BSA:          2.081 m Patient Age:    88 years        BP:           100/68 mmHg Patient Gender: M               HR:           74 bpm. Exam Location:  Inpatient  Procedure: TEE-Intraopertive  Indications:    Watchman  History:        Patient has prior history of Echocardiogram examinations. Arrythmias:Atrial Fibrillation.  Sonographer:    Darlys Gales Referring Phys: [id number] Lanier Prude  PROCEDURE: The transesophogeal probe was passed without difficulty through the esophogus of the patient. Sedation performed by different physician. The patient's vital signs; including heart rate, blood pressure, and oxygen saturation; remained stable throughout the procedure. The patient developed no complications during the procedure.  IMPRESSIONS   1. Brocolli appendage with no thrombus. Well positioned 27 mm Watchman FLX device with 1/3 mitral shoulder. No leak by color flow. Negative tugg test. Average compression 16%. 2. Post trans septal puncture only left to right shunting. 3. Left ventricular ejection fraction, by estimation, is 35%. The left ventricle has normal function. The left ventricle demonstrates global hypokinesis. The left ventricular internal cavity size was moderately dilated. 4. Right ventricular systolic function is mildly reduced. The right ventricular size is mildly enlarged. 5. Left atrial size was mildly dilated. No left atrial/left atrial appendage thrombus was detected. 6. Right atrial size was moderately dilated. 7. A small pericardial effusion is present. The pericardial effusion is posterior to the left ventricle. 8. The mitral valve is normal in structure. Trivial mitral valve regurgitation. No evidence of mitral stenosis. 9. The aortic valve is tricuspid. There is moderate calcification of the aortic valve. There is moderate thickening of the aortic valve. Aortic valve regurgitation is  moderate. Mild aortic valve stenosis. 10. The inferior vena cava is normal in size with greater than 50% respiratory variability, suggesting right atrial pressure of 3 mmHg.  Conclusion(s)/Recommendation(s): Normal biventricular function without evidence of hemodynamically significant valvular heart disease.  FINDINGS Left Ventricle: Left ventricular ejection fraction, by estimation, is 35%. The left ventricle has normal function. The left ventricle demonstrates global hypokinesis. The left ventricular internal cavity size was moderately dilated. There is no left ventricular hypertrophy.  Right Ventricle: The right ventricular size is mildly enlarged. No increase in right ventricular wall thickness. Right ventricular systolic function is mildly reduced.  Left Atrium: Left atrial size was mildly dilated. No left atrial/left atrial appendage thrombus was detected.  Right Atrium: Right atrial size was moderately dilated.  Pericardium: A small pericardial effusion is present. The pericardial effusion is posterior to the left ventricle.  Mitral Valve: The mitral valve is normal in structure. Trivial mitral valve regurgitation. No evidence of mitral valve stenosis.  Tricuspid Valve: The tricuspid valve is normal in structure. Tricuspid valve regurgitation is mild . No evidence of tricuspid stenosis.  Aortic Valve: The aortic valve  is tricuspid. There is moderate calcification of the aortic valve. There is moderate thickening of the aortic valve. Aortic valve regurgitation is moderate. Aortic regurgitation PHT measures 619 msec. Mild aortic stenosis is present. Aortic valve mean gradient measures 7.0 mmHg. Aortic valve peak gradient measures 9.9 mmHg. Aortic valve area, by VTI measures 1.60 cm.  Pulmonic Valve: The pulmonic valve was normal in structure. Pulmonic valve regurgitation is trivial. No evidence of pulmonic stenosis.  Aorta: The aortic root is normal in size and structure.  Venous:  The inferior vena cava is normal in size with greater than 50% respiratory variability, suggesting right atrial pressure of 3 mmHg.  IAS/Shunts: No atrial level shunt detected by color flow Doppler.  Additional Comments: Brocolli appendage with no thrombus. Well positioned 27 mm Watchman FLX device with 1/3 mitral shoulder. No leak by color flow. Negative tugg test. Average compression 16%. Post trans septal puncture only left to right shunting.  LEFT VENTRICLE PLAX 2D LVOT diam:     2.10 cm LV SV:         65 LV SV Index:   31 LVOT Area:     3.46 cm   AORTIC VALVE AV Area (Vmax):    1.56 cm AV Area (Vmean):   1.53 cm AV Area (VTI):     1.60 cm AV Vmax:           157.50 cm/s AV Vmean:          124.000 cm/s AV VTI:            0.408 m AV Peak Grad:      9.9 mmHg AV Mean Grad:      7.0 mmHg LVOT Vmax:         71.10 cm/s LVOT Vmean:        54.800 cm/s LVOT VTI:          0.189 m LVOT/AV VTI ratio: 0.46 AI PHT:            619 msec   SHUNTS Systemic VTI:  0.19 m Systemic Diam: 2.10 cm  Charlton Haws MD Electronically signed by Charlton Haws MD Signature Date/Time: 09/24/2022/12:02:02 PM    Final  MONITORS  LONG TERM MONITOR (3-14 DAYS) 03/28/2022  Narrative Patch Wear Time:  2 days and 22 hours  Predominant rhythm was sinus rhythm Less than 1% ventricular and supraventricular ectopy Less than 1% supraventricular ectopy 3.6% ventricular ectopy Triggered episodes associated with sinus rhythm PVCs  Will Camnitz, MD       ______________________________________________________________________________________________     EKG:  EKG is not  ordered today.    Recent Labs: 10/24/2022: BNP 153.5; Magnesium 2.2 02/23/2023: ALT 17; BUN 19; Creatinine, Ser 1.11; Hemoglobin 17.1; Platelets 145.0; Potassium 4.2; Sodium 142  Recent Lipid Panel    Component Value Date/Time   CHOL 109 02/23/2023 0720   CHOL 122 09/04/2022 0810   TRIG 65.0 02/23/2023 0720   HDL 41.40  02/23/2023 0720   HDL 58 09/04/2022 0810   CHOLHDL 3 02/23/2023 0720   VLDL 13.0 02/23/2023 0720   LDLCALC 55 02/23/2023 0720   LDLCALC 50 09/04/2022 0810   Risk Assessment/Calculations:    CHA2DS2-VASc Score = 5 [CHF History: 1, HTN History: 1, Diabetes History: 0, Stroke History: 0, Vascular Disease History: 1, Age Score: 2, Gender Score: 0].  Therefore, the patient's annual risk of stroke is 7.2 %.      Physical Exam:    VS:  BP 116/62   Pulse 70  Ht 5\' 11"  (1.803 m)   Wt 196 lb 12.8 oz (89.3 kg)   SpO2 95%   BMI 27.45 kg/m     Wt Readings from Last 3 Encounters:  03/30/23 196 lb 12.8 oz (89.3 kg)  03/13/23 195 lb 4 oz (88.6 kg)  03/10/23 195 lb (88.5 kg)    General: Well developed, well nourished, NAD Lungs:Clear to ausculation bilaterally. No wheezes, rales, or rhonchi. Breathing is unlabored. Cardiovascular: RRR with S1 S2. No murmurs Extremities: No edema.  Neuro: Alert and oriented. No focal deficits. No facial asymmetry. MAE spontaneously. Psych: Responds to questions appropriately with normal affect.    ASSESSMENT/PLAN:    Atrial fibrillation: s/p AF ablation and is now s/p LAAO with Watchman 27mm FLX Pro device on 8/14. He is now 6 month post implant and may stop Plavix. Given CAD will start ASA 81mg  daily. NO longer require dental SBE. Plan to touch base with him at one year post implant.    CAD s/p PCI: Denies anginal symptoms. As above, will restart ASA 81mg  daily.   LE pain: with ambulation. Referred to [facility] for vascular evaluation.    HTN: WNL today with no changes needed at this time.    NICM: with normalization on prior echo. Appears euvolemic today.    I spent 20 minutes caring for this patient today including face-to-face discussions, ordering and reviewing labs, reviewing records from [facility] and other outside facilities, documenting in the record, and arranging for follow up.      Medication Adjustments/Labs and Tests Ordered: Current  medicines are reviewed at length with the patient today.  Concerns regarding medicines are outlined above.  No orders of the defined types were placed in this encounter.  Meds ordered this encounter  Medications   aspirin EC 81 MG tablet    Sig: Take 1 tablet (81 mg total) by mouth daily. Swallow whole.    Patient Instructions  Medication Instructions:  Stop Azithromycin STOP Clopidogrel/Plavix START Aspirin 81mg  daily *If you need a refill on your cardiac medications before your next appointment, please call your pharmacy*  Follow-Up: At [facility], you and your health needs are our priority.  As part of our continuing mission to provide you with exceptional heart care, we have created designated Provider Care Teams.  These Care Teams include your primary Cardiologist (physician) and Advanced Practice Providers (APPs -  Physician Assistants and Nurse Practitioners) who all work together to provide you with the care you need, when you need it.  Your next appointment:   Structural Team will follow up  Provider:   Carlean Jews, PA-C     1st Floor: - Lobby - Registration  - Pharmacy  - Lab - Cafe  2nd Floor: - PV Lab - Diagnostic Testing (echo, CT, nuclear med)  3rd Floor: - Vacant  4th Floor: - TCTS (cardiothoracic surgery) - AFib Clinic - Structural Heart Clinic - Vascular Surgery  - Vascular Ultrasound  5th Floor: - HeartCare Cardiology (general and EP) - Clinical Pharmacy for coumadin, hypertension, lipid, weight-loss medications, and med management appointments    Valet parking services will be available as well.        Signed, Georgie Chard, NP  03/30/2023 8:52 AM    [facility]

## 2023-03-30 ENCOUNTER — Ambulatory Visit: Payer: Medicare Other | Attending: Cardiology | Admitting: Cardiology

## 2023-03-30 VITALS — BP 116/62 | HR 70 | Ht 71.0 in | Wt 196.8 lb

## 2023-03-30 DIAGNOSIS — I4819 Other persistent atrial fibrillation: Secondary | ICD-10-CM

## 2023-03-30 DIAGNOSIS — I5022 Chronic systolic (congestive) heart failure: Secondary | ICD-10-CM

## 2023-03-30 DIAGNOSIS — I10 Essential (primary) hypertension: Secondary | ICD-10-CM

## 2023-03-30 DIAGNOSIS — I25118 Atherosclerotic heart disease of native coronary artery with other forms of angina pectoris: Secondary | ICD-10-CM

## 2023-03-30 DIAGNOSIS — I2510 Atherosclerotic heart disease of native coronary artery without angina pectoris: Secondary | ICD-10-CM

## 2023-03-30 DIAGNOSIS — Z95818 Presence of other cardiac implants and grafts: Secondary | ICD-10-CM

## 2023-03-30 DIAGNOSIS — Z9861 Coronary angioplasty status: Secondary | ICD-10-CM

## 2023-03-30 MED ORDER — ASPIRIN 81 MG PO TBEC: 81.0000 mg | DELAYED_RELEASE_TABLET | Freq: Every day | ORAL | Status: AC

## 2023-03-30 NOTE — Patient Instructions (Signed)
Medication Instructions:  Stop Azithromycin STOP Clopidogrel/Plavix START Aspirin 81mg  daily *If you need a refill on your cardiac medications before your next appointment, please call your pharmacy*  Follow-Up: At [facility], you and your health needs are our priority.  As part of our continuing mission to provide you with exceptional heart care, we have created designated Provider Care Teams.  These Care Teams include your primary Cardiologist (physician) and Advanced Practice Providers (APPs -  Physician Assistants and Nurse Practitioners) who all work together to provide you with the care you need, when you need it.  Your next appointment:   Structural Team will follow up  Provider:   Carlean Jews, PA-C     1st Floor: - Lobby - Registration  - Pharmacy  - Lab - Cafe  2nd Floor: - PV Lab - Diagnostic Testing (echo, CT, nuclear med)  3rd Floor: - Vacant  4th Floor: - TCTS (cardiothoracic surgery) - AFib Clinic - Structural Heart Clinic - Vascular Surgery  - Vascular Ultrasound  5th Floor: - HeartCare Cardiology (general and EP) - Clinical Pharmacy for coumadin, hypertension, lipid, weight-loss medications, and med management appointments    Valet parking services will be available as well.

## 2023-03-30 NOTE — Progress Notes (Unsigned)
Patient name: NAYSHAWN MESTA MRN: [id number] DOB: May 10, 1934 Sex: male  REASON FOR CONSULT: Left leg pain with diminished pulses  HPI: TALEN POSER is a 89 y.o. male, with history of CHF, Atrial fibrillation, hypertension, that presents for evaluation of left leg pain with diminished pulses.  Patient states he has noticed several issues particularly with his left leg including discoloration with a purple hue as well as heaviness.  States now his right leg has the same appearance.  He also gets pain from his left buttock into the foot when going up 1 step.  No pain in his feet at this time.  Ongoing for past several months.  No significant leg pain when walking flat surfaces.  Past Medical History:  Diagnosis Date   Arthritis Years   Atypical atrial flutter (HCC) 12/31/2021   Blood transfusion without reported diagnosis Years ago   CAP (community acquired pneumonia) 01/19/2018   Cataract 1992   CHF (congestive heart failure) (HCC) Years ago   Emphysema of lung (HCC) 2014   GERD (gastroesophageal reflux disease) Years   Heart murmur Years ago   History of arthritis    History of rheumatic fever    Hypercholesterolemia    Hypertension Years ago   Nocturia    PAF (paroxysmal atrial fibrillation) (HCC) 02/2011   Placed on Pradaxa. Did not require cardioversion; spontaneously converted   Peripheral edema    Persistent atrial fibrillation (HCC) 01/21/2011   S/p ablation 12/2015 NSR up until his PCI Dec 2020 when he went into AF post PCI   Presence of Watchman left atrial appendage closure device 09/24/2022   27mm Watchman FLX Pro placed by Dr. 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   at least in situ   Tendonitis of elbow, left     Past Surgical History:  Procedure Laterality Date   ATRIAL FIBRILLATION ABLATION  12/19/2015   BACK SURGERY  12/24/2009   fusion C3-C4   BACK SURGERY  2010   fusion L4-L5  Wynetta Emery)   CARDIAC CATHETERIZATION  2009   NONOBSTRUCTIVE ATHERSCLEROTIC CORONARY DISEASE AND NORMAL  LV FUNCTION   CARDIAC CATHETERIZATION N/A 09/08/2014   Procedure: Left Heart Cath and Coronary Angiography;  Surgeon: Lyn Records, MD;  Location[facility] INVASIVE CV LAB;  Service: Cardiovascular;  Laterality: N/A;   CARDIAC CATHETERIZATION N/A 09/28/2015   Procedure: Left Heart Cath and Coronary Angiography;  Surgeon: Marykay Lex, MD;  Location: [facility] INVASIVE CV LAB;  Service: Cardiovascular;  Laterality: N/A;   CARDIAC CATHETERIZATION N/A 09/28/2015   Procedure: Intravascular Pressure Wire/FFR Study;  Surgeon: Marykay Lex, MD;  Location: [facility] INVASIVE CV LAB;  Service: Cardiovascular;  Laterality: N/A;   CARDIOVERSION N/A 10/23/2015   Procedure: CARDIOVERSION;  Surgeon: Lewayne Bunting, MD;  Location: [facility] ENDOSCOPY;  Service: Cardiovascular;  Laterality: N/A;   CARDIOVERSION N/A 01/01/2022   Procedure: CARDIOVERSION;  Surgeon: Meriam Sprague, MD;  Location: [facility] ENDOSCOPY;  Service: Cardiovascular;  Laterality: N/A;   COLONOSCOPY  2013   per patient, rpt 5 yrs   COLONOSCOPY WITH PROPOFOL N/A 12/02/2018   TAx2, HP, diverticulosis Carman Ching, MD)   CORONARY ATHERECTOMY N/A 01/11/2019   Procedure: CORONARY ATHERECTOMY;  Surgeon: Kathleene Hazel, MD;  Location: [facility] INVASIVE CV LAB;  Service: Cardiovascular;  Laterality: N/A;   CORONARY PRESSURE/FFR STUDY N/A 01/10/2019   Procedure: INTRAVASCULAR PRESSURE WIRE/FFR STUDY;  Surgeon: Herbie Baltimore,  Piedad Climes, MD;  Location: [facility] INVASIVE CV LAB;  Service: Cardiovascular;  Laterality: N/A;   CORONARY STENT INTERVENTION N/A 01/11/2019   Procedure: CORONARY STENT INTERVENTION;  Surgeon: Kathleene Hazel, MD;  Location: [facility] INVASIVE CV LAB;  Service: Cardiovascular;  Laterality: N/A;   ELECTROPHYSIOLOGIC STUDY N/A 12/19/2015   Procedure: Atrial Fibrillation Ablation;  Surgeon: Will Jorja Loa, MD;  Location: [facility] INVASIVE CV LAB;  Service:  Cardiovascular;  Laterality: N/A;   ESOPHAGOGASTRODUODENOSCOPY (EGD) WITH PROPOFOL N/A 12/02/2018   medium HH Carman Ching, MD)   EYE SURGERY  1992   KNEE ARTHROSCOPY Left 03/2016   Dr. Charlann Boxer   LEFT ATRIAL APPENDAGE OCCLUSION N/A 09/24/2022   Procedure: LEFT ATRIAL APPENDAGE OCCLUSION;  Surgeon: Lanier Prude, MD;  Location: [facility] INVASIVE CV LAB;  Service: Cardiovascular;  Laterality: N/A;   LEFT HEART CATH AND CORONARY ANGIOGRAPHY N/A 01/10/2019   Procedure: LEFT HEART CATH AND CORONARY ANGIOGRAPHY;  Surgeon: Marykay Lex, MD;  Location: [facility] INVASIVE CV LAB;  Service: Cardiovascular;  Laterality: N/A;   PATELLAR TENDON REPAIR Left 2008   POLYPECTOMY  12/02/2018   Procedure: POLYPECTOMY;  Surgeon: Carman Ching, MD;  Location: [facility] ENDOSCOPY;  Service: Endoscopy;;   TEE WITHOUT CARDIOVERSION N/A 09/24/2022   Procedure: TRANSESOPHAGEAL ECHOCARDIOGRAM;  Surgeon: Lanier Prude, MD;  Location: [facility] INVASIVE CV LAB;  Service: Cardiovascular;  Laterality: N/A;   TOTAL KNEE ARTHROPLASTY Right 2006   TOTAL KNEE ARTHROPLASTY Left 02/09/2017   Procedure: LEFT TOTAL KNEE ARTHROPLASTY, EXCISION LEFT DISTAL THIGH MASS;  Surgeon: Durene Romans, MD;  Location: [facility] ORS;  Service: Orthopedics;  Laterality: Left;  90 mins   TRICEPS TENDON REPAIR Left 2013    Family History  Problem Relation Age of Onset   Congestive Heart Failure Father 35   COPD Father    Hypertension Father    Asthma Sister    Heart disease Sister    Heart disease Sister        CHF   Heart disease Brother        CHF   COPD Brother    Stroke Neg Hx    Colon cancer Neg Hx    Stomach cancer Neg Hx    CAD Neg Hx        MI    SOCIAL HISTORY: Social History   Socioeconomic History   Marital status: Married    Spouse name: Not on file   Number of children: Not on file   Years of education: Not on file   Highest education level: Master's degree (e.g., MA, MS, MEng, MEd, MSW, MBA)  Occupational History   Occupation:  retired    Associate Professor: RETIRED    Comment: Acupuncturist  Tobacco Use   Smoking status: Never   Smokeless tobacco: Never   Tobacco comments:    Never smoke 01/13/22  Vaping Use   Vaping status: Never Used  Substance and Sexual Activity   Alcohol use: Yes    Alcohol/week: 10.0 standard drinks of alcohol    Types: 10 Cans of beer per week    Comment: 2 beers daily 01/13/22   Drug use: No   Sexual activity: Not Currently  Other Topics Concern   Not on file  Social History Narrative   Lives with wife, no pets   Retired   Occ: Acupuncturist   Edu: master's   Activity: golf   Diet: good water, fruits/vegetables daily   Social Drivers of Corporate investment banker Strain: Low Risk  (  01/20/2023)   Overall Financial Resource Strain (CARDIA)    Difficulty of Paying Living Expenses: Not hard at all  Food Insecurity: No Food Insecurity (01/20/2023)   Hunger Vital Sign    Worried About Running Out of Food in the Last Year: Never true    Ran Out of Food in the Last Year: Never true  Transportation Needs: No Transportation Needs (01/20/2023)   PRAPARE - Administrator, Civil Service (Medical): No    Lack of Transportation (Non-Medical): No  Physical Activity: Inactive (01/20/2023)   Exercise Vital Sign    Days of Exercise per Week: 0 days    Minutes of Exercise per Session: 0 min  Stress: No Stress Concern Present (01/20/2023)   [employer] of Occupational Health - Occupational Stress Questionnaire    Feeling of Stress : Only a little  Social Connections: Moderately Integrated (01/20/2023)   Social Connection and Isolation Panel [NHANES]    Frequency of Communication with Friends and Family: More than three times a week    Frequency of Social Gatherings with Friends and Family: Once a week    Attends Religious Services: 1 to 4 times per year    Active Member of [employer] or Organizations: No    Attends Banker Meetings: Never    Marital  Status: Married  Catering manager Violence: Not At Risk (01/21/2023)   Humiliation, Afraid, Rape, and Kick questionnaire    Fear of Current or Ex-Partner: No    Emotionally Abused: No    Physically Abused: No    Sexually Abused: No    Allergies  Allergen Reactions   Penicillins Anaphylaxis, Rash and Other (See Comments)    "Blistering rash" Has patient had a PCN reaction causing immediate rash, facial/tongue/throat swelling, SOB or lightheadedness with hypotension: Yes Has patient had a PCN reaction causing severe rash involving mucus membranes or skin necrosis: No Has patient had a PCN reaction that required hospitalization: No Has patient had a PCN reaction occurring within the last 10 years: No If all of the above answers are "NO", then may proceed with Cephalosporin use.    Amlodipine Swelling and Other (See Comments)    Peripheral edema   Effexor [Venlafaxine] Other (See Comments)    Tremors/shaking   Lisinopril Cough   Mevacor [Lovastatin] Other (See Comments)    Caused cataracts and elevated liver enzymes   Vicodin [Hydrocodone-Acetaminophen] Nausea And Vomiting   Zocor [Simvastatin] Other (See Comments)    LFT increase with simvastatin and lovastatin    Tamiflu [Oseltamivir Phosphate] Itching and Rash    Current Outpatient Medications  Medication Sig Dispense Refill   acetaminophen (TYLENOL) 500 MG tablet Take 1 tablet (500 mg total) by mouth 3 (three) times daily as needed. 30 tablet 0   aspirin EC 81 MG tablet Take 1 tablet (81 mg total) by mouth daily. Swallow whole.     atorvastatin (LIPITOR) 40 MG tablet Take 1 tablet (40 mg total) by mouth daily. 90 tablet 3   BREZTRI AEROSPHERE 160-9-4.8 MCG/ACT AERO USE 2 INHALATIONS BY MOUTH TWICE DAILY 32.1 g 3   Cholecalciferol (VITAMIN D3) 2000 units TABS Take 2,000 Units by mouth daily.     cyanocobalamin (VITAMIN B12) 500 MCG tablet Take 1 tablet (500 mcg total) by mouth every Monday, Wednesday, and Friday.     ezetimibe  (ZETIA) 10 MG tablet TAKE 1 TABLET BY MOUTH DAILY 90 tablet 3   FARXIGA 10 MG TABS tablet TAKE 1 TABLET BY MOUTH DAILY  BEFORE BREAKFAST 90 tablet 3   furosemide (LASIX) 20 MG tablet Take 1 tablet (20 mg total) by mouth daily. 90 tablet 3   gabapentin (NEURONTIN) 300 MG capsule Take 900 mg by mouth at bedtime.     ipratropium-albuterol (DUONEB) 0.5-2.5 (3) MG/3ML SOLN 1 neb every 6 hours as needed 360 mL 12   metoprolol succinate (TOPROL-XL) 50 MG 24 hr tablet Take 50 mg by mouth daily. Take with or immediately following a meal.     pantoprazole (PROTONIX) 40 MG tablet Take 1 tablet (40 mg total) by mouth daily. 90 tablet 4   potassium chloride (KLOR-CON M) 10 MEQ tablet Take 1 tablet (10 mEq total) by mouth daily.     sacubitril-valsartan (ENTRESTO) 24-26 MG Take 1 tablet by mouth 2 (two) times daily. 180 tablet 3   traZODone (DESYREL) 50 MG tablet TAKE 1/2 TO 1 TABLET BY MOUTH AT BEDTIME AS NEEDED FOR SLEEP 90 tablet 3   vitamin C (ASCORBIC ACID) 500 MG tablet Take 500 mg by mouth daily.     No current facility-administered medications for this visit.    REVIEW OF SYSTEMS:  [X]  denotes positive finding, [ ]  denotes negative finding Cardiac  Comments:  Chest pain or chest pressure:    Shortness of breath upon exertion:    Short of breath when lying flat:    Irregular heart rhythm:        Vascular    Pain in calf, thigh, or hip brought on by ambulation:    Pain in feet at night that wakes you up from your sleep:     Blood clot in your veins:    Leg swelling:         Pulmonary    Oxygen at home:    Productive cough:     Wheezing:         Neurologic    Sudden weakness in arms or legs:     Sudden numbness in arms or legs:     Sudden onset of difficulty speaking or slurred speech:    Temporary loss of vision in one eye:     Problems with dizziness:         Gastrointestinal    Blood in stool:     Vomited blood:         Genitourinary    Burning when urinating:     Blood in  urine:        Psychiatric    Major depression:         Hematologic    Bleeding problems:    Problems with blood clotting too easily:        Skin    Rashes or ulcers:        Constitutional    Fever or chills:      PHYSICAL EXAM: There were no vitals filed for this visit.  GENERAL: The patient is a well-nourished male, in no acute distress. The vital signs are documented above. CARDIAC: There is a regular rate and rhythm.  VASCULAR:  Bilateral common femoral pulses palpable Bilateral DP PT pulses palpable Bilateral lower extremity varicosities with venous congestion PULMONARY: No respiratory distress. ABDOMEN: Soft and non-tender . MUSCULOSKELETAL: There are no major deformities or cyanosis. NEUROLOGIC: No focal weakness or paresthesias are detected. SKIN: There are no ulcers or rashes noted. PSYCHIATRIC: The patient has a normal affect.  DATA:   ABIs today are 1.29 on the right triphasic and 1.30 on the left triphasic   Assessment/Plan:  89 y.o. male, with history of CHF, Atrial fibrillation, hypertension, that presents for evaluation of left leg pain with diminished pulses.  Patient states he has noticed several issues particularly with his left leg including discoloration with a purple hue as well as heaviness.  States now his right leg has the same appearance.  He also gets pain from his left buttock into the foot when going up one step.   Discussed I do not think he has any significant arterial insufficiency.  His ABIs are normal today with normal triphasic waveforms at the ankle.  He has palpable pedal pulses on exam.  He does have evidence of chronic venous insufficiency with valvular reflux that would explain the heaviness and discoloration in both lower extremities.  I have discussed the etiology for chronic venous insufficiency.  I discussed elevation with exercise and compression.  We did get him sized for knee-high medical grade compression today.  I will bring him  back for lower extremity reflux study with a focus on the left leg.  When I elevated his left leg in the office all the discoloration resolved suggesting venous hypertension.  I do think he should follow-up with his spine surgeon Dr. Wynetta Emery as I think a lot of his left leg radicular symptoms sound neuropathic and has a hx of L4-L5 surgery.   Cephus Shelling, MD [facility] Office: [phone]

## 2023-03-31 ENCOUNTER — Ambulatory Visit (HOSPITAL_COMMUNITY)
Admission: RE | Admit: 2023-03-31 | Discharge: 2023-03-31 | Disposition: A | Discharge status: Home / Self Care | Payer: Medicare Other | Source: Ambulatory Visit | Attending: Vascular Surgery | Admitting: Vascular Surgery

## 2023-03-31 ENCOUNTER — Encounter: Payer: Self-pay | Admitting: Vascular Surgery

## 2023-03-31 ENCOUNTER — Ambulatory Visit (INDEPENDENT_AMBULATORY_CARE_PROVIDER_SITE_OTHER): Payer: Medicare Other | Admitting: Vascular Surgery

## 2023-03-31 VITALS — BP 132/74 | HR 70 | Temp 97.6°F | Resp 20 | Ht 71.0 in | Wt 195.9 lb

## 2023-03-31 DIAGNOSIS — I872 Venous insufficiency (chronic) (peripheral): Secondary | ICD-10-CM | POA: Diagnosis not present

## 2023-03-31 DIAGNOSIS — M7989 Other specified soft tissue disorders: Secondary | ICD-10-CM

## 2023-03-31 DIAGNOSIS — M79605 Pain in left leg: Secondary | ICD-10-CM | POA: Insufficient documentation

## 2023-03-31 LAB — VAS US ABI WITH/WO TBI
Left ABI: 1.3
Right ABI: 1.29

## 2023-04-07 ENCOUNTER — Other Ambulatory Visit: Payer: Self-pay

## 2023-04-07 DIAGNOSIS — I872 Venous insufficiency (chronic) (peripheral): Secondary | ICD-10-CM

## 2023-04-08 ENCOUNTER — Telehealth (HOSPITAL_COMMUNITY): Payer: Self-pay | Admitting: *Deleted

## 2023-04-08 NOTE — Telephone Encounter (Signed)
 Reaching out to patient to offer assistance regarding upcoming cardiac imaging study; pt verbalizes understanding of appt date/time, parking situation and where to check in, pre-test NPO status and medications ordered, and verified current allergies; name and call back number provided for further questions should they arise Theodore Frame RN Navigator Cardiac Imaging [name] [phone] office [phone] cell  Patient aware to avoid caffeine for 12 hours prior to test.

## 2023-04-09 ENCOUNTER — Encounter
Admission: RE | Admit: 2023-04-09 | Discharge: 2023-04-09 | Disposition: A | Discharge status: Home / Self Care | Payer: Medicare Other | Source: Ambulatory Visit | Attending: Family | Admitting: Family

## 2023-04-09 DIAGNOSIS — I25118 Atherosclerotic heart disease of native coronary artery with other forms of angina pectoris: Secondary | ICD-10-CM

## 2023-04-09 DIAGNOSIS — I493 Ventricular premature depolarization: Secondary | ICD-10-CM | POA: Diagnosis not present

## 2023-04-09 DIAGNOSIS — R001 Bradycardia, unspecified: Secondary | ICD-10-CM | POA: Diagnosis present

## 2023-04-09 DIAGNOSIS — I5022 Chronic systolic (congestive) heart failure: Secondary | ICD-10-CM | POA: Diagnosis not present

## 2023-04-09 DIAGNOSIS — I4819 Other persistent atrial fibrillation: Secondary | ICD-10-CM

## 2023-04-09 MED ORDER — REGADENOSON 0.4 MG/5ML IV SOLN
0.4000 mg | Freq: Once | INTRAVENOUS | Status: AC
  Administered 2023-04-09: 0.4 mg via INTRAVENOUS
  Filled 2023-04-09: qty 5

## 2023-04-09 MED ORDER — RUBIDIUM RB82 GENERATOR (RUBYFILL)
25.0000 | PACK | Freq: Once | INTRAVENOUS | Status: AC
  Administered 2023-04-09: 22.99 via INTRAVENOUS

## 2023-04-09 MED ORDER — REGADENOSON 0.4 MG/5ML IV SOLN
INTRAVENOUS | Status: AC
  Filled 2023-04-09: qty 5

## 2023-04-09 MED ORDER — RUBIDIUM RB82 GENERATOR (RUBYFILL)
25.0000 | PACK | Freq: Once | INTRAVENOUS | Status: AC
  Administered 2023-04-09: 22.94 via INTRAVENOUS

## 2023-04-09 NOTE — Progress Notes (Signed)
 Patient presents for a cardiac PET stress test and tolerated procedure without incident. Patient maintained acceptable vital signs throughout the test and was offered caffeine after test.  Patient ambulated out of department with a steady gait.

## 2023-04-10 ENCOUNTER — Encounter (HOSPITAL_BASED_OUTPATIENT_CLINIC_OR_DEPARTMENT_OTHER): Payer: Self-pay

## 2023-04-10 LAB — NM PET CT CARDIAC PERFUSION MULTI W/ABSOLUTE BLOODFLOW
LV dias vol: 142 mL (ref 62–150)
LV sys vol: 86 mL
Nuc Rest EF: 39 %
Nuc Stress EF: 39 %
Peak HR: 76 {beats}/min
Rest HR: 61 {beats}/min
Rest Nuclear Isotope Dose: 23 mCi
SRS: 9
SSS: 0
ST Depression (mm): 0 mm
Stress Nuclear Isotope Dose: 22.9 mCi
TID: 1.08

## 2023-05-01 ENCOUNTER — Ambulatory Visit (INDEPENDENT_AMBULATORY_CARE_PROVIDER_SITE_OTHER): Payer: Medicare Other | Admitting: Cardiovascular Disease

## 2023-05-01 ENCOUNTER — Other Ambulatory Visit (HOSPITAL_BASED_OUTPATIENT_CLINIC_OR_DEPARTMENT_OTHER): Payer: Self-pay | Admitting: *Deleted

## 2023-05-01 ENCOUNTER — Encounter (HOSPITAL_BASED_OUTPATIENT_CLINIC_OR_DEPARTMENT_OTHER): Payer: Self-pay | Admitting: Cardiovascular Disease

## 2023-05-01 VITALS — BP 118/62 | HR 78 | Ht 71.0 in | Wt 194.9 lb

## 2023-05-01 DIAGNOSIS — I4510 Unspecified right bundle-branch block: Secondary | ICD-10-CM

## 2023-05-01 DIAGNOSIS — Z9861 Coronary angioplasty status: Secondary | ICD-10-CM

## 2023-05-01 DIAGNOSIS — I484 Atypical atrial flutter: Secondary | ICD-10-CM

## 2023-05-01 DIAGNOSIS — I10 Essential (primary) hypertension: Secondary | ICD-10-CM

## 2023-05-01 DIAGNOSIS — I480 Paroxysmal atrial fibrillation: Secondary | ICD-10-CM | POA: Diagnosis not present

## 2023-05-01 DIAGNOSIS — I493 Ventricular premature depolarization: Secondary | ICD-10-CM

## 2023-05-01 DIAGNOSIS — E7800 Pure hypercholesterolemia, unspecified: Secondary | ICD-10-CM

## 2023-05-01 DIAGNOSIS — I700 Atherosclerosis of aorta: Secondary | ICD-10-CM

## 2023-05-01 DIAGNOSIS — I2510 Atherosclerotic heart disease of native coronary artery without angina pectoris: Secondary | ICD-10-CM

## 2023-05-01 DIAGNOSIS — I5042 Chronic combined systolic (congestive) and diastolic (congestive) heart failure: Secondary | ICD-10-CM

## 2023-05-01 MED ORDER — METOPROLOL SUCCINATE ER 50 MG PO TB24: 50.0000 mg | ORAL_TABLET | Freq: Every day | ORAL | 3 refills | Status: DC

## 2023-05-01 NOTE — Patient Instructions (Addendum)
 Medication Instructions:  Your physician recommends that you continue on your current medications as directed. Please refer to the Current Medication list given to you today.   *If you need a refill on your cardiac medications before your next appointment, please call your pharmacy*  Lab Work: NONE  Testing/Procedures: NONE   Follow-Up: At [facility], you and your health needs are our priority.  As part of our continuing mission to provide you with exceptional heart care, we have created designated Provider Care Teams.  These Care Teams include your primary Cardiologist (physician) and Advanced Practice Providers (APPs -  Physician Assistants and Nurse Practitioners) who all work together to provide you with the care you need, when you need it.  We recommend signing up for the patient portal called "MyChart".  Sign up information is provided on this After Visit Summary.  MyChart is used to connect with patients for Virtual Visits (Telemedicine).  Patients are able to view lab/test results, encounter notes, upcoming appointments, etc.  Non-urgent messages can be sent to your provider as well.   To learn more about what you can do with MyChart, go to [email or web address].    Your next appointment:   4 month(s)  Provider:   Gillian Shields, NP    DR [facility] IN 8 TO 9 MONTHS

## 2023-05-01 NOTE — Progress Notes (Signed)
 Cardiology Office Note:  .   Date:  05/01/2023  ID:  [name], DOB 02-Sep-1934, MRN [id number] PCP: Eustaquio Boyden, MD  [facility] Providers Cardiologist:  Chilton Si, MD Electrophysiologist:  Will Jorja Loa, MD    History of Present Illness: [name]   Theodore [name] is a 89 y.o. male with CAD s/p LAD PCI, chronic diastolic heart failure, hypertension, hyperlipidemia, persistent atrial fibrillation s/p ablation, asthma and Rheumatic fever who presents for follow up.  Theodore [name] was previously a patient of Dr. Patty Sermons.  Theodore [name] was diagnosed with atrial fibrillation in 2014.  He was initially on Pradaxa and switched to Xarelto.  He underwent ablation on 12/19/15 with Dr. Elberta Fortis and has remained in sinus rhythm.  He had an episode of chest pain 08/2014 and had a Lexiscan Myoview 08/31/14 that showed an old inferior scar with peri-infarct ischemia and LVEF 49%.  He then had a LHC that revealed 70% ostial LAD, 50% prox LAD, 30% OM1 and 35% RCA.  He was managed medically.  He continued to report exertional symptoms despite medical management. He had a LHC with Dr. Herbie Baltimore on 09/28/15 that again revealed a 70% ostial-proximal LAD lesion.  Dr. Herbie Baltimore performed FFR on that lesion and there was no significant change.  The left ventriculogram performed at that time revealed LVEF 35-45%.  This subsequently improved to 50% on 03/2018.  He again underwent left heart cath on 01/10/2019 and there was progression of his LAD lesion.  He underwent atherectomy and PCI with drug-eluting stent placement 01/11/2019.  Since then he had noted an improvement in his breathing.  Atorvastatin was increased to 80 mg but he noted leg pain.  He was seen in the lipid clinic and started back on atorvastatin 40mg  and zetia 10 mg.     Theodore [name] underwent L knee replacement on  02/09/17.  Xarelto was held for 2 days prior to surgery and resumed post-operatively.  He developed bleeding at the incision site so Xarelto  was again held and he was switched to aspirin.  He subsequently developed a left lower extremity DVT and pulmonary embolism.  Xarelto was resumed. He had been struggling with peripheral neuropathy.  It started a couple weeks after the cath and he thought it was related to Plavix.  He saw Dr. Elberta Fortis and switched from clopidogrel and and Xarelto to ticagrelor and Eliquis.  His neuropathy did not improve.  He followed up with his PCP and was started on gabapentin and nortriptyline. [name] complained of recurrent atrial fibrillation.  He wore a 14-day event monitor 07/2019 that showed some PACs, PVCs, and ventricular bigeminy but no atrial fibrillation.    Theodore [name] continued to suffer from symmetric burning sensations in his feet as well as shortness of breath with minimal exertion.  His LE edema was improving on 40 mg Lasix. He had reported one episode of internal bleeding, so his blood thinner was discontinued and 81 mg aspirin was started. He followed up with his neurologist Dr. Sherryll Burger, who recommended an implant procedure in his lower back. Prior steroidal injections for nerve impingement were not helping. Given his persistent shortness of breath, he had an Echo 09/2020 that showed his LVEF had reduced to 55 to 60% with global hypokinesis.  He had grade 2 diastolic dysfunction.  He was started on [place].  Since that time he has been feeling much better.  He had a repeat echo 12/2020 with LVEF 55-60% and indeterminate diastolic function.  Right atrial pressure was 3 mmHG. He saw Dr. Elberta Fortis 10/2020 and decided to hold his statin for 30 days to see if this would help his neuropathy.    He noticed improvement on [place]. He saw pharmacist 01/2021 and was started on PCSK9 inhibitor.  He saw Otilio Saber, PA-C on 05/2021 with symptoms of atrial fibrillation.  He was in A-fib for about 10 days and rates were well controlled.  He was restarted on Xarelto and plans were made for follow-up and  possible cardioversion or amiodarone.  He saw Francis Dowse, PA-C on 06/2021 and was back in sinus rhythm.  He reported low blood pressures at home after his beta-blocker was increased.  He saw Dr. Elberta Fortis 10/2021 and decided to stay on Xarelto.   At his visit 12/2021 weeks back in atrial flutter.  He underwent successful cardiac 12/2021.   Discussed the use of AI scribe software for clinical note transcription with the patient, who gave verbal consent to proceed.  He subsequently had bradycardia.  Amiodarone was reduced due to tremors.  Echo 05/2022 revealed LVEF 30-35% with low normal RV function.  He saw Gillian Shields, NP 05/2022 and Sherryll Burger was reduced to half tablet twice daily.  He had frequent nosebleeds and was referred to Dr. Lalla Brothers for [facility] implant which occurred on 09/2022.  Metoprolol was switched to propranolol due to tremor.  However due to his bradycardia we will switch back to a lower dose of metoprolol.  Spironolactone was discontinued due to hypotension.  Previously stopped Repatha due to concern for his neurologic symptoms but notes that it did not improve after stopping it.  He followed up with Georgie Chard, NP 03/2023 and reported lower extremity pain.  He was referred to vein and vascular.  History of Present Illness [name] inquires about using Advil for pain relief during golf, as he previously avoided it while on Xarelto. He is on a low dose of Lasix and Farxiga, which he believes help manage his symptoms. No recent swelling is noted.  He experiences occasional shortness of breath, which he believes may be related to his lungs. His medications have been adjusted recently, with reductions in metoprolol and Entresto, which he feels have helped. He tried propranolol for hand tremors, but it caused significant lung issues, so he discontinued it. He is considering primidone for tremors but is concerned about potential lung side effects.  He feels generally well but experiences some  fatigue and low energy, which he attributes to his age. He has been able to play golf occasionally.  He has a back problem causing pain down his leg, suspected to be related to his lower back. He has had two prior back surgeries and is scheduled to see a specialist next week.  Essential tremor is present, and there is a consideration of starting primidone. However, he is concerned about potential respiratory side effects and prefers to continue with metoprolol due to previous adverse reactions to propranolol.  ROS:  As per HPI  Studies Reviewed: .       Cardiac PET 03/2023:   LV perfusion is normal.   Rest left ventricular function is abnormal. Rest global function is moderately reduced. Rest EF: 39%. Stress left ventricular function is abnormal. Stress global function is moderately reduced. Stress EF: 39%. End diastolic cavity size is normal.   Myocardial blood flow reserve is not reported in this patient due to technical or patient-specific concerns that affect accuracy (systolic dysfunction).   Coronary calcium assessment  not performed due to prior revascularization.  Incidental note is made of trivial pericardial effusion versus pericardial thickening seen on the attenuation correction CT.   Findings are consistent with no ischemia and no infarction. The study is intermediate risk due to reduced LVEF.   Electronically signed by: Yvonne Kendall, MD.    Risk Assessment/Calculations:    CHA2DS2-VASc Score = 5   This indicates a 7.2% annual risk of stroke. The patient's score is based upon: CHF History: 1 HTN History: 1 Diabetes History: 0 Stroke History: 0 Vascular Disease History: 1 Age Score: 2 Gender Score: 0            Physical Exam:   VS:  BP 118/62   Pulse 78   Ht 5\' 11"  (1.803 m)   Wt 194 lb 14.4 oz (88.4 kg)   SpO2 99%   BMI 27.18 kg/m  , BMI Body mass index is 27.18 kg/m. GENERAL:  Well appearing HEENT: Pupils equal round and reactive, fundi not visualized,  oral mucosa unremarkable NECK:  No jugular venous distention, waveform within normal limits, carotid upstroke brisk and symmetric, no bruits, no thyromegaly LUNGS:  Clear to auscultation bilaterally HEART:  RRR.  PMI not displaced or sustained,S1 and S2 within normal limits, no S3, no S4, no clicks, no rubs, no murmurs ABD:  Flat, positive bowel sounds normal in frequency in pitch, no bruits, no rebound, no guarding, no midline pulsatile mass, no hepatomegaly, no splenomegaly EXT:  2 plus pulses throughout, no edema, no cyanosis no clubbing SKIN:  No rashes no nodules NEURO:  Cranial nerves II through XII grossly intact, motor grossly intact throughout PSYCH:  Cognitively intact, oriented to person place and time   ASSESSMENT AND PLAN: .    Assessment & Plan # Heart failure with reduced ejection fraction (HFrEF) Chronic heart failure with EF 39%, likely due to AFib, not ischemic. Well-managed with current medications. BP controlled, no edema or weight gain. EF stable, supports active lifestyle. - Continue metoprolol, Entresto, Lasix, and Farxiga. - Monitor weight daily for fluid retention. - Educated on weight monitoring and fluid overload signs.  # Atrial fibrillation (AFib) Chronic AFib managed with Watchman and anticoagulation. On aspirin post-procedure. PET scan shows reduced EF not due to blockages, likely due to AFib.  He is in sinus rhythm today.   - Continue aspirin therapy. - Follow-up with [facility] in four months for AFib monitoring.  # Essential tremor Hand tremors, prefers metoprolol due to lung issues. Primidone may cause respiratory side effects. - Consult pulmonologist before starting primidone. - Continue metoprolol for tremor management.  # Chronic back pain with radiculopathy Back pain with leg radiation, likely related to past surgeries. - Follow up with Dr. Wynetta Emery for back pain management.  # Follow-up - Schedule follow-up with Caitlin in four months. - Schedule  follow-up with cardiologist in eight to nine months. - Ensure follow-up with pulmonologist in August.  Recommend getting their approval prior to trying primidone.    Signed, Chilton Si, MD

## 2023-05-05 ENCOUNTER — Other Ambulatory Visit (HOSPITAL_COMMUNITY): Payer: Medicare Other

## 2023-05-26 ENCOUNTER — Ambulatory Visit (HOSPITAL_COMMUNITY)
Admission: RE | Admit: 2023-05-26 | Discharge: 2023-05-26 | Disposition: A | Discharge status: Home / Self Care | Payer: Medicare Other | Source: Ambulatory Visit | Attending: Vascular Surgery | Admitting: Vascular Surgery

## 2023-05-26 ENCOUNTER — Encounter: Payer: Self-pay | Admitting: Vascular Surgery

## 2023-05-26 ENCOUNTER — Ambulatory Visit (INDEPENDENT_AMBULATORY_CARE_PROVIDER_SITE_OTHER): Payer: Medicare Other | Admitting: Vascular Surgery

## 2023-05-26 VITALS — BP 111/72 | HR 67 | Temp 97.4°F | Resp 18 | Ht 71.0 in | Wt 193.7 lb

## 2023-05-26 DIAGNOSIS — I872 Venous insufficiency (chronic) (peripheral): Secondary | ICD-10-CM | POA: Insufficient documentation

## 2023-05-26 NOTE — Progress Notes (Signed)
 Patient name: Theodore [name] MRN: [id number] DOB: May 07, 1934 Sex: male  REASON FOR CONSULT: Follow-up for evaluation of chronic venous insufficiency  HPI: Theodore [name] is a 89 y.o. male, with history of CHF, Atrial fibrillation, hypertension, that presents for follow-up for evaluation of chronic venous insufficiency.    Previously seen with discoloration with a purple hue as well as heaviness worse in the left leg.  States now his right leg has the same appearance.  He also gets pain from his left buttock into the foot when going up 1 step.  Ultimately we ruled out any concern for arterial insufficiency as he had normal ABIs and palpable pulses.  Past Medical History:  Diagnosis Date   Arthritis Years   Atypical atrial flutter (HCC) 12/31/2021   Blood transfusion without reported diagnosis Years ago   CAP (community acquired pneumonia) 01/19/2018   Cataract 1992   CHF (congestive heart failure) (HCC) Years ago   Emphysema of lung (HCC) 2014   GERD (gastroesophageal reflux disease) Years   Heart murmur Years ago   History of arthritis    History of rheumatic fever    Hypercholesterolemia    Hypertension Years ago   Nocturia    PAF (paroxysmal atrial fibrillation) (HCC) 02/2011   Placed on Pradaxa. Did not require cardioversion; spontaneously converted   Peripheral edema    Persistent atrial fibrillation (HCC) 01/21/2011   S/p ablation 12/2015 NSR up until his PCI Dec 2020 when he went into AF post PCI   Presence of Watchman left atrial appendage closure device 09/24/2022   27mm Watchman FLX Pro placed by Dr. 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   at least in situ   Tendonitis of elbow, left     Past Surgical History:  Procedure Laterality Date   ATRIAL FIBRILLATION ABLATION  12/19/2015   BACK SURGERY  12/24/2009   fusion C3-C4   BACK SURGERY  2010   fusion L4-L5 Wynetta Emery)   CARDIAC CATHETERIZATION   2009   NONOBSTRUCTIVE ATHERSCLEROTIC CORONARY DISEASE AND NORMAL  LV FUNCTION   CARDIAC CATHETERIZATION N/A 09/08/2014   Procedure: Left Heart Cath and Coronary Angiography;  Surgeon: Lyn Records, MD;  Location: [facility] INVASIVE CV LAB;  Service: Cardiovascular;  Laterality: N/A;   CARDIAC CATHETERIZATION N/A 09/28/2015   Procedure: Left Heart Cath and Coronary Angiography;  Surgeon: Marykay Lex, MD;  Location: [facility] INVASIVE CV LAB;  Service: Cardiovascular;  Laterality: N/A;   CARDIAC CATHETERIZATION N/A 09/28/2015   Procedure: Intravascular Pressure Wire/FFR Study;  Surgeon: Marykay Lex, MD;  Location: [facility] INVASIVE CV LAB;  Service: Cardiovascular;  Laterality: N/A;   CARDIOVERSION N/A 10/23/2015   Procedure: CARDIOVERSION;  Surgeon: Lewayne Bunting, MD;  Location: [facility] ENDOSCOPY;  Service: Cardiovascular;  Laterality: N/A;   CARDIOVERSION N/A 01/01/2022   Procedure: CARDIOVERSION;  Surgeon: Meriam Sprague, MD;  Location: [facility] ENDOSCOPY;  Service: Cardiovascular;  Laterality: N/A;   COLONOSCOPY  2013   per patient, rpt 5 yrs   COLONOSCOPY WITH PROPOFOL N/A 12/02/2018   TAx2, HP, diverticulosis Carman Ching, MD)   CORONARY ATHERECTOMY N/A 01/11/2019   Procedure: CORONARY ATHERECTOMY;  Surgeon: Kathleene Hazel, MD;  Location: [facility] INVASIVE CV LAB;  Service: Cardiovascular;  Laterality: N/A;   CORONARY PRESSURE/FFR STUDY N/A 01/10/2019   Procedure: INTRAVASCULAR PRESSURE WIRE/FFR STUDY;  Surgeon: Marykay Lex, MD;  Location: [facility] INVASIVE CV LAB;  Service: Cardiovascular;  Laterality: N/A;   CORONARY STENT INTERVENTION N/A 01/11/2019   Procedure: CORONARY STENT INTERVENTION;  Surgeon: Kathleene Hazel, MD;  Location: [facility] INVASIVE CV LAB;  Service: Cardiovascular;  Laterality: N/A;   ELECTROPHYSIOLOGIC STUDY N/A 12/19/2015   Procedure: Atrial Fibrillation Ablation;  Surgeon: Will Jorja Loa, MD;  Location: [facility] INVASIVE CV LAB;  Service: Cardiovascular;  Laterality: N/A;    ESOPHAGOGASTRODUODENOSCOPY (EGD) WITH PROPOFOL N/A 12/02/2018   medium HH Carman Ching, MD)   EYE SURGERY  1992   KNEE ARTHROSCOPY Left 03/2016   Dr. Charlann Boxer   LEFT ATRIAL APPENDAGE OCCLUSION N/A 09/24/2022   Procedure: LEFT ATRIAL APPENDAGE OCCLUSION;  Surgeon: Lanier Prude, MD;  Location: [facility] INVASIVE CV LAB;  Service: Cardiovascular;  Laterality: N/A;   LEFT HEART CATH AND CORONARY ANGIOGRAPHY N/A 01/10/2019   Procedure: LEFT HEART CATH AND CORONARY ANGIOGRAPHY;  Surgeon: Marykay Lex, MD;  Location: [facility] INVASIVE CV LAB;  Service: Cardiovascular;  Laterality: N/A;   PATELLAR TENDON REPAIR Left 2008   POLYPECTOMY  12/02/2018   Procedure: POLYPECTOMY;  Surgeon: Carman Ching, MD;  Location: [facility] ENDOSCOPY;  Service: Endoscopy;;   TEE WITHOUT CARDIOVERSION N/A 09/24/2022   Procedure: TRANSESOPHAGEAL ECHOCARDIOGRAM;  Surgeon: Lanier Prude, MD;  Location: [facility] INVASIVE CV LAB;  Service: Cardiovascular;  Laterality: N/A;   TOTAL KNEE ARTHROPLASTY Right 2006   TOTAL KNEE ARTHROPLASTY Left 02/09/2017   Procedure: LEFT TOTAL KNEE ARTHROPLASTY, EXCISION LEFT DISTAL THIGH MASS;  Surgeon: Durene Romans, MD;  Location: [facility] ORS;  Service: Orthopedics;  Laterality: Left;  90 mins   TRICEPS TENDON REPAIR Left 2013    Family History  Problem Relation Age of Onset   Congestive Heart Failure Father 17   COPD Father    Hypertension Father    Asthma Sister    Heart disease Sister    Heart disease Sister        CHF   Heart disease Brother        CHF   COPD Brother    Stroke Neg Hx    Colon cancer Neg Hx    Stomach cancer Neg Hx    CAD Neg Hx        MI    SOCIAL HISTORY: Social History   Socioeconomic History   Marital status: Married    Spouse name: Not on file   Number of children: Not on file   Years of education: Not on file   Highest education level: Master's degree (e.g., MA, MS, MEng, MEd, MSW, MBA)  Occupational History   Occupation: retired    Associate Professor: RETIRED     Comment: Acupuncturist  Tobacco Use   Smoking status: Never   Smokeless tobacco: Never   Tobacco comments:    Never smoke 01/13/22  Vaping Use   Vaping status: Never Used  Substance and Sexual Activity   Alcohol use: Yes    Alcohol/week: 10.0 standard drinks of alcohol    Types: 10 Cans of beer per week    Comment: 2 beers daily 01/13/22   Drug use: No   Sexual activity: Not Currently  Other Topics Concern   Not on file  Social History Narrative   Lives with wife, no pets   Retired   Occ: Acupuncturist   Edu: master's   Activity: golf   Diet: good water, fruits/vegetables daily   Social Drivers of Corporate investment banker Strain: Low Risk  (01/20/2023)   Overall Financial Resource Strain (CARDIA)  Difficulty of Paying Living Expenses: Not hard at all  Food Insecurity: No Food Insecurity (01/20/2023)   Hunger Vital Sign    Worried About Running Out of Food in the Last Year: Never true    Ran Out of Food in the Last Year: Never true  Transportation Needs: No Transportation Needs (01/20/2023)   PRAPARE - Administrator, Civil Service (Medical): No    Lack of Transportation (Non-Medical): No  Physical Activity: Inactive (01/20/2023)   Exercise Vital Sign    Days of Exercise per Week: 0 days    Minutes of Exercise per Session: 0 min  Stress: No Stress Concern Present (01/20/2023)   [employer] of Occupational Health - Occupational Stress Questionnaire    Feeling of Stress : Only a little  Social Connections: Moderately Integrated (01/20/2023)   Social Connection and Isolation Panel [NHANES]    Frequency of Communication with Friends and Family: More than three times a week    Frequency of Social Gatherings with Friends and Family: Once a week    Attends Religious Services: 1 to 4 times per year    Active Member of [employer] or Organizations: No    Attends Banker Meetings: Never    Marital Status: Married  Catering manager  Violence: Not At Risk (01/21/2023)   Humiliation, Afraid, Rape, and Kick questionnaire    Fear of Current or Ex-Partner: No    Emotionally Abused: No    Physically Abused: No    Sexually Abused: No    Allergies  Allergen Reactions   Penicillins Anaphylaxis, Rash and Other (See Comments)    "Blistering rash" Has patient had a PCN reaction causing immediate rash, facial/tongue/throat swelling, SOB or lightheadedness with hypotension: Yes Has patient had a PCN reaction causing severe rash involving mucus membranes or skin necrosis: No Has patient had a PCN reaction that required hospitalization: No Has patient had a PCN reaction occurring within the last 10 years: No If all of the above answers are "NO", then may proceed with Cephalosporin use.    Amlodipine Swelling and Other (See Comments)    Peripheral edema   Effexor [Venlafaxine] Other (See Comments)    Tremors/shaking   Lisinopril Cough   Mevacor [Lovastatin] Other (See Comments)    Caused cataracts and elevated liver enzymes   Vicodin [Hydrocodone-Acetaminophen] Nausea And Vomiting   Zocor [Simvastatin] Other (See Comments)    LFT increase with simvastatin and lovastatin    Tamiflu [Oseltamivir Phosphate] Itching and Rash    Current Outpatient Medications  Medication Sig Dispense Refill   acetaminophen (TYLENOL) 500 MG tablet Take 1 tablet (500 mg total) by mouth 3 (three) times daily as needed. 30 tablet 0   aspirin EC 81 MG tablet Take 1 tablet (81 mg total) by mouth daily. Swallow whole.     atorvastatin (LIPITOR) 40 MG tablet Take 1 tablet (40 mg total) by mouth daily. 90 tablet 3   BREZTRI AEROSPHERE 160-9-4.8 MCG/ACT AERO USE 2 INHALATIONS BY MOUTH TWICE DAILY 32.1 g 3   Cholecalciferol (VITAMIN D3) 2000 units TABS Take 2,000 Units by mouth daily.     cyanocobalamin (VITAMIN B12) 500 MCG tablet Take 1 tablet (500 mcg total) by mouth every Monday, Wednesday, and Friday.     ezetimibe (ZETIA) 10 MG tablet TAKE 1 TABLET  BY MOUTH DAILY 90 tablet 3   FARXIGA 10 MG TABS tablet TAKE 1 TABLET BY MOUTH DAILY  BEFORE BREAKFAST 90 tablet 3   furosemide (LASIX) 20 MG  tablet Take 1 tablet (20 mg total) by mouth daily. 90 tablet 3   gabapentin (NEURONTIN) 300 MG capsule Take 900 mg by mouth at bedtime.     ipratropium-albuterol (DUONEB) 0.5-2.5 (3) MG/3ML SOLN 1 neb every 6 hours as needed (Patient not taking: Reported on 05/01/2023) 360 mL 12   metoprolol succinate (TOPROL-XL) 50 MG 24 hr tablet Take 1 tablet (50 mg total) by mouth daily. Take with or immediately following a meal. 90 tablet 3   pantoprazole (PROTONIX) 40 MG tablet Take 1 tablet (40 mg total) by mouth daily. 90 tablet 4   potassium chloride (KLOR-CON M) 10 MEQ tablet Take 1 tablet (10 mEq total) by mouth daily.     sacubitril-valsartan (ENTRESTO) 24-26 MG Take 1 tablet by mouth 2 (two) times daily. 180 tablet 3   spironolactone (ALDACTONE) 25 MG tablet Take 0.5 tablets by mouth daily.     traZODone (DESYREL) 50 MG tablet TAKE 1/2 TO 1 TABLET BY MOUTH AT BEDTIME AS NEEDED FOR SLEEP 90 tablet 3   vitamin C (ASCORBIC ACID) 500 MG tablet Take 500 mg by mouth daily.     No current facility-administered medications for this visit.    REVIEW OF SYSTEMS:  [X]  denotes positive finding, [ ]  denotes negative finding Cardiac  Comments:  Chest pain or chest pressure:    Shortness of breath upon exertion:    Short of breath when lying flat:    Irregular heart rhythm:        Vascular    Pain in calf, thigh, or hip brought on by ambulation:    Pain in feet at night that wakes you up from your sleep:     Blood clot in your veins:    Leg swelling:         Pulmonary    Oxygen at home:    Productive cough:     Wheezing:         Neurologic    Sudden weakness in arms or legs:     Sudden numbness in arms or legs:     Sudden onset of difficulty speaking or slurred speech:    Temporary loss of vision in one eye:     Problems with dizziness:          Gastrointestinal    Blood in stool:     Vomited blood:         Genitourinary    Burning when urinating:     Blood in urine:        Psychiatric    Major depression:         Hematologic    Bleeding problems:    Problems with blood clotting too easily:        Skin    Rashes or ulcers:        Constitutional    Fever or chills:      PHYSICAL EXAM: There were no vitals filed for this visit.  GENERAL: The patient is a well-nourished male, in no acute distress. The vital signs are documented above. CARDIAC: There is a regular rate and rhythm.  VASCULAR:  Bilateral common femoral pulses palpable Bilateral DP PT pulses palpable Bilateral lower extremity varicosities with venous congestion PULMONARY: No respiratory distress. ABDOMEN: Soft and non-tender . MUSCULOSKELETAL: There are no major deformities or cyanosis. NEUROLOGIC: No focal weakness or paresthesias are detected. SKIN: There are no ulcers or rashes noted. PSYCHIATRIC: The patient has a normal affect.  DATA:     Lower Venous  Reflux Study  Patient Name:  GIULIANO PREECE  Date of Exam:   05/26/2023 Medical Rec #: [id number]        Accession #:    [id number] Date of Birth: 18-Jan-1935         Patient Gender: M Patient Age:   66 years Exam Location:  [facility] Procedure:      VAS [place] LOWER EXTREMITY VENOUS REFLUX Referring Phys: Sherald Hess   --------------------------------------------------------------------------- -----   Indications: Pain, Swelling, and varicosities.   Risk Factors: Surgery Left Knee Trauma Patellar Tendon Tear Venous Insufficiency. Performing Technologist: Criss Rosales RVT    Examination Guidelines: A complete evaluation includes B-mode imaging, spectral Doppler, color Doppler, and power Doppler as needed of all accessible portions of each vessel. Bilateral testing is considered an integral part of a complete examination. Limited examinations for reoccurring  indications may be performed as noted. The reflux portion of the exam is performed with the patient in reverse Trendelenburg. Significant venous reflux is defined as >500 ms in the superficial venous system, and >1 second in the deep venous system.    +--------------+---------+------+-----------+------------+--------+ LEFT          Reflux NoRefluxReflux TimeDiameter cmsComments                         Yes                                  +--------------+---------+------+-----------+------------+--------+ CFV                           >1 second                      +--------------+---------+------+-----------+------------+--------+ GSV at SFJ              yes    >500 ms      0.70             +--------------+---------+------+-----------+------------+--------+ GSV prox thigh          yes    >500 ms      0.55             +--------------+---------+------+-----------+------------+--------+ GSV mid thigh           yes    >500 ms      0.50             +--------------+---------+------+-----------+------------+--------+ GSV dist thigh          yes    >500 ms      0.47             +--------------+---------+------+-----------+------------+--------+ GSV at knee             yes    >500 ms      0.37             +--------------+---------+------+-----------+------------+--------+ GSV prox calf           yes    >500 ms      0.40             +--------------+---------+------+-----------+------------+--------+ GSV mid calf            yes    >500 ms      0.40             +--------------+---------+------+-----------+------------+--------+ GSV dist calf  yes    >500 ms      0.40             +--------------+---------+------+-----------+------------+--------+ SSV Pop Fossa no                            0.20             +--------------+---------+------+-----------+------------+--------+ SSV prox calf no                             0.20             +--------------+---------+------+-----------+------------+--------+ SSV mid calf  no                            0.20             +--------------+---------+------+-----------+------------+--------+ Medial Calf VV          yes    >500 ms      0.35             +--------------+---------+------+-----------+------------+--------+    Summary: Left: - No evidence of deep vein thrombosis seen in the left lower extremity, from the common femoral through the popliteal veins. - No evidence of superficial venous thrombosis in the left lower extremity. - Venous reflux is noted in the left common femoral vein. - Venous reflux is noted in the left sapheno-femoral junction. - Venous reflux is noted in the left greater saphenous vein in the thigh. - Venous reflux is noted in the left greater saphenous vein in the calf and associated mid calf varicosites.   *See table(s) above for measurements and observations.  Electronically signed by Jimmye Moulds MD on 05/26/2023 at 1:45:40 PM.    ABIs 03/31/23 were 1.29 on the right triphasic and 1.30 on the left triphasic   Assessment/Plan:  89 y.o. male, with history of CHF, Atrial fibrillation, hypertension, that presents for further follow-up for evaluation of chronic venous insufficiency.  Previously seen to rule out arterial insufficiency and had normal ABIs with normal exam and palpable pulses.  He did complain of heaviness and discoloration to both lower extremities and I was concerned about possible underlying chronic venous insufficiency.  Discussed his exam today confirms chronic venous insufficiency with valvular reflux that would explain his discoloration and varicose veins with heaviness in the leg.  He has no evidence of arterial insufficiency with again palpable pulses and normal ABIs.  Ultimately we agreed on conservative management with leg elevation, exercise, compression stockings.  Potentially  would be a candidate for left great saphenous vein ablation but he has fairly minimal symptoms at this time that should be well treated with conservative therapy.   Young Hensen, MD [facility] Office: [phone]

## 2023-05-27 DIAGNOSIS — M7989 Other specified soft tissue disorders: Secondary | ICD-10-CM

## 2023-06-12 ENCOUNTER — Other Ambulatory Visit: Payer: Self-pay | Admitting: Neurosurgery

## 2023-06-12 DIAGNOSIS — M5440 Lumbago with sciatica, unspecified side: Secondary | ICD-10-CM

## 2023-06-19 NOTE — Discharge Instructions (Addendum)
 Post Procedure Spinal Discharge Instruction Sheet  You may resume a regular diet and any medications that you routinely take (including pain medications) unless otherwise noted by MD.  No driving day of procedure.  Light activity throughout the rest of the day.  Do not do any strenuous work, exercise, bending or lifting.  The day following the procedure, you can resume normal physical activity but you should refrain from exercising or physical therapy for at least three days thereafter.  You may apply ice to the injection site, 20 minutes on, 20 minutes off, as needed. Do not apply ice directly to skin.    Common Side Effects:  Headaches- take your usual medications as directed by your physician.  Increase your fluid intake.  Caffeinated beverages may be helpful.  Lie flat in bed until your headache resolves.  Restlessness or inability to sleep- you may have trouble sleeping for the next few days.  Ask your referring physician if you need any medication for sleep.  Facial flushing or redness- should subside within a few days.  Increased pain- a temporary increase in pain a day or two following your procedure is not unusual.  Take your pain medication as prescribed by your referring physician.  Leg cramps  Please contact our office at [phone] for the following symptoms: Fever greater than 100 degrees. Headaches unresolved with medication after 2-3 days. Increased swelling, pain, or redness at injection site.  MAY RESUME YOUR ASPIRIN TODAY.  Thank you for visiting [facility] today.

## 2023-06-22 ENCOUNTER — Ambulatory Visit
Admission: RE | Admit: 2023-06-22 | Discharge: 2023-06-22 | Disposition: A | Discharge status: Home / Self Care | Source: Ambulatory Visit | Attending: Neurosurgery | Admitting: Neurosurgery

## 2023-06-22 DIAGNOSIS — M5440 Lumbago with sciatica, unspecified side: Secondary | ICD-10-CM

## 2023-06-22 MED ORDER — METHYLPREDNISOLONE ACETATE 40 MG/ML INJ SUSP (RADIOLOG
80.0000 mg | Freq: Once | INTRAMUSCULAR | Status: AC
  Administered 2023-06-22: 80 mg via EPIDURAL

## 2023-06-22 MED ORDER — IOPAMIDOL (ISOVUE-M 200) INJECTION 41%
1.0000 mL | Freq: Once | INTRAMUSCULAR | Status: AC
  Administered 2023-06-22: 1 mL via EPIDURAL

## 2023-07-06 ENCOUNTER — Encounter (HOSPITAL_BASED_OUTPATIENT_CLINIC_OR_DEPARTMENT_OTHER): Payer: Self-pay

## 2023-07-07 ENCOUNTER — Encounter: Payer: Self-pay | Admitting: General Practice

## 2023-07-07 ENCOUNTER — Ambulatory Visit: Attending: General Practice | Admitting: General Practice

## 2023-07-07 VITALS — BP 112/62 | HR 87 | Ht 71.0 in | Wt 196.6 lb

## 2023-07-07 DIAGNOSIS — I10 Essential (primary) hypertension: Secondary | ICD-10-CM | POA: Diagnosis not present

## 2023-07-07 DIAGNOSIS — E7800 Pure hypercholesterolemia, unspecified: Secondary | ICD-10-CM

## 2023-07-07 DIAGNOSIS — I5022 Chronic systolic (congestive) heart failure: Secondary | ICD-10-CM | POA: Diagnosis not present

## 2023-07-07 DIAGNOSIS — I480 Paroxysmal atrial fibrillation: Secondary | ICD-10-CM

## 2023-07-07 DIAGNOSIS — J449 Chronic obstructive pulmonary disease, unspecified: Secondary | ICD-10-CM

## 2023-07-07 NOTE — Telephone Encounter (Signed)
 Recommend visit with APP in gen cards or AFib clinic. Alternatively, can schedule for nurse visit as well.   [place] Sloop S Katherinne Mofield, NP

## 2023-07-07 NOTE — Progress Notes (Signed)
 Cardiology Clinic Note   Patient Name: Theodore [name] Date of Encounter: 07/07/2023  Primary Care Provider:  Claire Crick, MD Primary Cardiologist:  Maudine Sos, MD  Patient Profile    Theodore [name] 89 year old male presents to the clinic today for an evaluation of his elevated heart rate.  Past Medical History    Past Medical History:  Diagnosis Date   Arthritis Years   Atypical atrial flutter (HCC) 12/31/2021   Blood transfusion without reported diagnosis Years ago   CAP (community acquired pneumonia) 01/19/2018   Cataract 1992   CHF (congestive heart failure) (HCC) Years ago   Emphysema of lung (HCC) 2014   GERD (gastroesophageal reflux disease) Years   Heart murmur Years ago   History of arthritis    History of rheumatic fever    Hypercholesterolemia    Hypertension Years ago   Nocturia    PAF (paroxysmal atrial fibrillation) (HCC) 02/2011   Placed on Pradaxa . Did not require cardioversion; spontaneously converted   Peripheral edema    Persistent atrial fibrillation (HCC) 01/21/2011   S/p ablation 12/2015 NSR up until his PCI Dec 2020 when he went into AF post PCI   Presence of Watchman left atrial appendage closure device 09/24/2022   27mm Watchman FLX Pro placed by Dr. Marven Slimmer   Right bundle branch block    SOB (shortness of breath)    Squamous cell carcinoma of scalp 04/2022   at least in situ   Tendonitis of elbow, left    Past Surgical History:  Procedure Laterality Date   ATRIAL FIBRILLATION ABLATION  12/19/2015   BACK SURGERY  12/24/2009   fusion C3-C4   BACK SURGERY  2010   fusion L4-L5 Lamon Pillow)   CARDIAC CATHETERIZATION  2009   NONOBSTRUCTIVE ATHERSCLEROTIC CORONARY DISEASE AND NORMAL  LV FUNCTION   CARDIAC CATHETERIZATION N/A 09/08/2014   Procedure: Left Heart Cath and Coronary Angiography;  Surgeon: Arty Binning, MD;  Location: [facility] INVASIVE CV LAB;  Service: Cardiovascular;  Laterality: N/A;   CARDIAC CATHETERIZATION N/A 09/28/2015    Procedure: Left Heart Cath and Coronary Angiography;  Surgeon: Arleen Lacer, MD;  Location: [facility] INVASIVE CV LAB;  Service: Cardiovascular;  Laterality: N/A;   CARDIAC CATHETERIZATION N/A 09/28/2015   Procedure: Intravascular Pressure Wire/FFR Study;  Surgeon: Arleen Lacer, MD;  Location: [facility] INVASIVE CV LAB;  Service: Cardiovascular;  Laterality: N/A;   CARDIOVERSION N/A 10/23/2015   Procedure: CARDIOVERSION;  Surgeon: Lenise Quince, MD;  Location: [facility] ENDOSCOPY;  Service: Cardiovascular;  Laterality: N/A;   CARDIOVERSION N/A 01/01/2022   Procedure: CARDIOVERSION;  Surgeon: Sonny Dust, MD;  Location: [facility] ENDOSCOPY;  Service: Cardiovascular;  Laterality: N/A;   COLONOSCOPY  2013   per patient, rpt 5 yrs   COLONOSCOPY WITH PROPOFOL  N/A 12/02/2018   TAx2, HP, diverticulosis Jolinda Necessary, MD)   CORONARY ATHERECTOMY N/A 01/11/2019   Procedure: CORONARY ATHERECTOMY;  Surgeon: Odie Benne, MD;  Location: [facility] INVASIVE CV LAB;  Service: Cardiovascular;  Laterality: N/A;   CORONARY PRESSURE/FFR STUDY N/A 01/10/2019   Procedure: INTRAVASCULAR PRESSURE WIRE/FFR STUDY;  Surgeon: Arleen Lacer, MD;  Location: [facility] INVASIVE CV LAB;  Service: Cardiovascular;  Laterality: N/A;   CORONARY STENT INTERVENTION N/A 01/11/2019   Procedure: CORONARY STENT INTERVENTION;  Surgeon: Odie Benne, MD;  Location: [facility] INVASIVE CV LAB;  Service: Cardiovascular;  Laterality: N/A;   ELECTROPHYSIOLOGIC STUDY N/A 12/19/2015   Procedure: Atrial Fibrillation Ablation;  Surgeon: Will Cortland Ding, MD;  Location:  [facility] INVASIVE CV LAB;  Service: Cardiovascular;  Laterality: N/A;   ESOPHAGOGASTRODUODENOSCOPY (EGD) WITH PROPOFOL  N/A 12/02/2018   medium HH Jolinda Necessary, MD)   EYE SURGERY  1992   KNEE ARTHROSCOPY Left 03/2016   Dr. Bernard Brick   LEFT ATRIAL APPENDAGE OCCLUSION N/A 09/24/2022   Procedure: LEFT ATRIAL APPENDAGE OCCLUSION;  Surgeon: Boyce Byes, MD;  Location: [facility] INVASIVE CV  LAB;  Service: Cardiovascular;  Laterality: N/A;   LEFT HEART CATH AND CORONARY ANGIOGRAPHY N/A 01/10/2019   Procedure: LEFT HEART CATH AND CORONARY ANGIOGRAPHY;  Surgeon: Arleen Lacer, MD;  Location: [facility] INVASIVE CV LAB;  Service: Cardiovascular;  Laterality: N/A;   PATELLAR TENDON REPAIR Left 2008   POLYPECTOMY  12/02/2018   Procedure: POLYPECTOMY;  Surgeon: Jolinda Necessary, MD;  Location: [facility] ENDOSCOPY;  Service: Endoscopy;;   TEE WITHOUT CARDIOVERSION N/A 09/24/2022   Procedure: TRANSESOPHAGEAL ECHOCARDIOGRAM;  Surgeon: Boyce Byes, MD;  Location: [facility] INVASIVE CV LAB;  Service: Cardiovascular;  Laterality: N/A;   TOTAL KNEE ARTHROPLASTY Right 2006   TOTAL KNEE ARTHROPLASTY Left 02/09/2017   Procedure: LEFT TOTAL KNEE ARTHROPLASTY, EXCISION LEFT DISTAL THIGH MASS;  Surgeon: Claiborne Crew, MD;  Location: [facility] ORS;  Service: Orthopedics;  Laterality: Left;  90 mins   TRICEPS TENDON REPAIR Left 2013    Allergies  Allergies  Allergen Reactions   Penicillins Anaphylaxis, Rash and Other (See Comments)    "Blistering rash" Has patient had a PCN reaction causing immediate rash, facial/tongue/throat swelling, SOB or lightheadedness with hypotension: Yes Has patient had a PCN reaction causing severe rash involving mucus membranes or skin necrosis: No Has patient had a PCN reaction that required hospitalization: No Has patient had a PCN reaction occurring within the last 10 years: No If all of the above answers are "NO", then may proceed with Cephalosporin use.    Amlodipine  Swelling and Other (See Comments)    Peripheral edema   Effexor  [Venlafaxine ] Other (See Comments)    Tremors/shaking   Lisinopril Cough   Mevacor [Lovastatin] Other (See Comments)    Caused cataracts and elevated liver enzymes   Vicodin [Hydrocodone -Acetaminophen ] Nausea And Vomiting   Zocor [Simvastatin] Other (See Comments)    LFT increase with simvastatin and lovastatin    Tamiflu [Oseltamivir Phosphate]  Itching and Rash    History of Present Illness    BURECH MCFARLAND has a PMH of coronary artery disease, chronic diastolic CHF, HTN, HLD, persistent atrial fibrillation status post ablation, asthma and rheumatic fever.  He was previously followed by Dr. Santiago Cuff.  He was diagnosed with atrial fibrillation in 2014.  He was initially on Pradaxa  and transition to Xarelto .  He underwent ablation 11/17 by Dr. Lawana Pray.  He reported chest pain 7/16 and had nuclear stress testing 7/16 that showed old inferior scar and peri-infarct ischemia with an LVEF of 49%.  He underwent LHC which showed 70% ostial LAD, 50% proximal LAD, 30% OM1, and 35% RCA.  Medical management was recommended.  He underwent subsequent cardiac catheterization with arthrectomy and PCI 12/20.  At that time he noted an improvement with his breathing.  His atorvastatin  was increased to 80 mg but he noted leg pain.  He was transferred to the lipid clinic.  He was started back on atorvastatin  40 and ezetimibe  10.  He continue to follow-up with cardiology regularly.  He was seen in follow-up by Michaelle Adolphus, PA-C on 4/23.  He noted symptoms of atrial fibrillation.  He was noted to be in atrial fibrillation for  10 days with well-controlled rates.  He was restarted on Xarelto  with plans for follow-up and possible cardioversion or amiodarone  therapy.  He saw Mertha Abrahams PA-C on 5/23.  He was back in sinus rhythm.  He reported low blood pressures at home after his beta-blocker had been increased.  He was seen in follow-up by Dr. Lawana Pray 9/23 and he was maintained on Xarelto .  At his visit 11/23 he was back in atrial flutter.  He underwent successful DCCV 11/23.  He was seen by Neomi Banks, NP on 4/24.  His Entresto  was reduced to half tablet twice daily.  He had frequent nosebleeds and was referred to Dr. Marven Slimmer for [facility] implant which occurred on 8/24.  His metoprolol  was switched to propranolol  due to tremor.  However due to bradycardia he  switched back to low-dose metoprolol .  His spironolactone  was discontinued due to hypotension.  Previously he had been on Repatha  which was stopped due to neurologic symptoms.  Symptoms did not improve after discontinuing medication.  He followed up with Drema Genta, NP on 2/25.  He had been on Plavix  for 6 months and was transition to aspirin .  He no longer required dental SBE.  He reported lower extremity pain.  He was referred to vein and vascular.  He underwent watchman implant on 09/24/2022.  He was seen in follow-up by Dr. Theodis Fiscal on 05/01/2023.  During that time he was using Advil for pain relief during golf.  He had previously avoided this while on Xarelto .  He was continued on low-dose Farxiga  and Lasix .  He felt this was helping to manage his symptoms.  He did note occasional episodes of shortness of breath which he believed was related to his lungs.  He felt better with reduction in his and his metoprolol  and Entresto .  He noted that he had significant lung issues with propranolol .  This was discontinued.  He felt generally well but noted some fatigue and low energy which he attributed to his age.  He was able to continue to play golf occasionally.  CHA2DS2-VASc score was noted to be 5.  His blood pressure was 118/62.  His pulse was 78.  He sent a MyChart message on 07/06/2023.  He noted that for the last several days his heart rate had been in the 70s-90s.  His blood pressure was running in the mid 90s over 60s.  He felt that he may be back in atrial fibrillation.  He requested a clinic visit.  He was added to my schedule for today.  He states he feels more fatigued and more short of breath.  He notes that he went into atrial fibrillation around Wednesday or Thursday.  He would like to undergo repeat DCCV.  I discussed case with DCCV and Mertha Abrahams PA-C.  I will reach out to Dr. Marven Slimmer to discuss case.  We will proceed with his recommendations.  I will have him follow-up with post  DCCV.  Today he denies chest pain, lower extremity edema, fmelena, hematuria, hemoptysis, diaphoresis, weakness, presyncope, syncope, orthopnea, and PND.      Home Medications    Prior to Admission medications   Medication Sig Start Date End Date Taking? Authorizing Provider  acetaminophen  (TYLENOL ) 500 MG tablet Take 1 tablet (500 mg total) by mouth 3 (three) times daily as needed. 01/19/18   Claire Crick, MD  aspirin  EC 81 MG tablet Take 1 tablet (81 mg total) by mouth daily. Swallow whole. 03/30/23   McDaniel, Jill D, NP  atorvastatin  (  LIPITOR) 40 MG tablet Take 1 tablet (40 mg total) by mouth daily. 02/24/23 02/19/24  Clearnce Curia, NP  BREZTRI  AEROSPHERE 160-9-4.8 MCG/ACT AERO USE 2 INHALATIONS BY MOUTH TWICE DAILY 01/14/23   Rosa College D, MD  Cholecalciferol  (VITAMIN D3) 2000 units TABS Take 2,000 Units by mouth daily.    [provider]  cyanocobalamin  (VITAMIN B12) 500 MCG tablet Take 1 tablet (500 mcg total) by mouth every Monday, Wednesday, and Friday. 03/02/23   Claire Crick, MD  ezetimibe  (ZETIA ) 10 MG tablet TAKE 1 TABLET BY MOUTH DAILY 09/16/22   Maudine Sos, MD  FARXIGA  10 MG TABS tablet TAKE 1 TABLET BY MOUTH DAILY  BEFORE BREAKFAST 09/16/22   Maudine Sos, MD  furosemide  (LASIX ) 20 MG tablet Take 1 tablet (20 mg total) by mouth daily. 02/24/23   Walker, Caitlin S, NP  gabapentin  (NEURONTIN ) 300 MG capsule Take 900 mg by mouth at bedtime.    [provider]  ipratropium-albuterol  (DUONEB) 0.5-2.5 (3) MG/3ML SOLN 1 neb every 6 hours as needed Patient not taking: Reported on 05/01/2023 03/21/22   Faustina Hood, MD  metoprolol  succinate (TOPROL -XL) 50 MG 24 hr tablet Take 1 tablet (50 mg total) by mouth daily. Take with or immediately following a meal. 05/01/23   Maudine Sos, MD  pantoprazole  (PROTONIX ) 40 MG tablet Take 1 tablet (40 mg total) by mouth daily. 03/02/23   Claire Crick, MD  potassium chloride  (KLOR-CON  M) 10 MEQ tablet  Take 1 tablet (10 mEq total) by mouth daily. 03/02/23   Claire Crick, MD  sacubitril -valsartan  (ENTRESTO ) 24-26 MG Take 1 tablet by mouth 2 (two) times daily. 10/24/22   Clearnce Curia, NP  spironolactone  (ALDACTONE ) 25 MG tablet Take 0.5 tablets by mouth daily.    [provider]  traZODone  (DESYREL ) 50 MG tablet TAKE 1/2 TO 1 TABLET BY MOUTH AT BEDTIME AS NEEDED FOR SLEEP 01/13/23   Claire Crick, MD  vitamin C  (ASCORBIC ACID ) 500 MG tablet Take 500 mg by mouth daily.    [provider]    Family History    Family History  Problem Relation Age of Onset   Congestive Heart Failure Father 67   COPD Father    Hypertension Father    Asthma Sister    Heart disease Sister    Heart disease Sister        CHF   Heart disease Brother        CHF   COPD Brother    Stroke Neg Hx    Colon cancer Neg Hx    Stomach cancer Neg Hx    CAD Neg Hx        MI   He indicated that his mother is deceased. He indicated that his father is deceased. He indicated that both of his sisters are alive. He indicated that both of his brothers are alive. He indicated that his maternal grandmother is deceased. He indicated that his maternal grandfather is deceased. He indicated that his paternal grandmother is deceased. He indicated that the status of his paternal grandfather is unknown. He indicated that the status of his neg hx is unknown.  Social History    Social History   Socioeconomic History   Marital status: Married    Spouse name: Not on file   Number of children: Not on file   Years of education: Not on file   Highest education level: Master's degree (e.g., MA, MS, MEng, MEd, MSW, MBA)  Occupational History  Occupation: retired    Associate Professor: RETIRED    Comment: Acupuncturist  Tobacco Use   Smoking status: Never   Smokeless tobacco: Never   Tobacco comments:    Never smoke 01/13/22  Vaping Use   Vaping status: Never Used  Substance and Sexual Activity   Alcohol  use: Yes    Alcohol/week: 10.0 standard drinks of alcohol    Types: 10 Cans of beer per week    Comment: 2 beers daily 01/13/22   Drug use: No   Sexual activity: Not Currently  Other Topics Concern   Not on file  Social History Narrative   Lives with wife, no pets   Retired   Research scientist (medical): Acupuncturist   Edu: master's   Activity: golf   Diet: good water , fruits/vegetables daily   Social Drivers of Corporate investment banker Strain: Low Risk  (01/20/2023)   Overall Financial Resource Strain (CARDIA)    Difficulty of Paying Living Expenses: Not hard at all  Food Insecurity: No Food Insecurity (01/20/2023)   Hunger Vital Sign    Worried About Running Out of Food in the Last Year: Never true    Ran Out of Food in the Last Year: Never true  Transportation Needs: No Transportation Needs (01/20/2023)   PRAPARE - Administrator, Civil Service (Medical): No    Lack of Transportation (Non-Medical): No  Physical Activity: Inactive (01/20/2023)   Exercise Vital Sign    Days of Exercise per Week: 0 days    Minutes of Exercise per Session: 0 min  Stress: No Stress Concern Present (01/20/2023)   [employer] of Occupational Health - Occupational Stress Questionnaire    Feeling of Stress : Only a little  Social Connections: Moderately Integrated (01/20/2023)   Social Connection and Isolation Panel [NHANES]    Frequency of Communication with Friends and Family: More than three times a week    Frequency of Social Gatherings with Friends and Family: Once a week    Attends Religious Services: 1 to 4 times per year    Active Member of [employer] or Organizations: No    Attends Banker Meetings: Never    Marital Status: Married  Catering manager Violence: Not At Risk (01/21/2023)   Humiliation, Afraid, Rape, and Kick questionnaire    Fear of Current or Ex-Partner: No    Emotionally Abused: No    Physically Abused: No    Sexually Abused: No     Review of Systems     General:  No chills, fever, night sweats or weight changes.  Cardiovascular:  No chest pain, dyspnea on exertion, edema, orthopnea, palpitations, paroxysmal nocturnal dyspnea. Dermatological: No rash, lesions/masses Respiratory: No cough, dyspnea Urologic: No hematuria, dysuria Abdominal:   No nausea, vomiting, diarrhea, bright red blood per rectum, melena, or hematemesis Neurologic:  No visual changes, wkns, changes in mental status. All other systems reviewed and are otherwise negative except as noted above.  Physical Exam    VS:  BP 112/62   Pulse 87   Ht 5\' 11"  (1.803 m)   Wt 196 lb 9.6 oz (89.2 kg)   SpO2 96%   BMI 27.42 kg/m  , BMI Body mass index is 27.42 kg/m. GEN: Well nourished, well developed, in no acute distress. HEENT: normal. Neck: Supple, no JVD, carotid bruits, or masses. Cardiac: Irregularly irregular, no murmurs, rubs, or gallops. No clubbing, cyanosis, edema.  Radials/DP/PT 2+ and equal bilaterally.  Respiratory:  Respirations regular and unlabored, clear to  auscultation bilaterally. GI: Soft, nontender, nondistended, BS + x 4. MS: no deformity or atrophy. Skin: warm and dry, no rash. Neuro:  Strength and sensation are intact. Psych: Normal affect.  Accessory Clinical Findings    Recent Labs: 10/24/2022: BNP 153.5; Magnesium 2.2 02/23/2023: ALT 17; BUN 19; Creatinine, Ser 1.11; Hemoglobin 17.1; Platelets 145.0; Potassium 4.2; Sodium 142   Recent Lipid Panel    Component Value Date/Time   CHOL 109 02/23/2023 0720   CHOL 122 09/04/2022 0810   TRIG 65.0 02/23/2023 0720   HDL 41.40 02/23/2023 0720   HDL 58 09/04/2022 0810   CHOLHDL 3 02/23/2023 0720   VLDL 13.0 02/23/2023 0720   LDLCALC 55 02/23/2023 0720   LDLCALC 50 09/04/2022 0810         ECG personally reviewed by me today- EKG Interpretation Date/Time:  Tuesday Jul 07 2023 13:31:09 EDT Ventricular Rate:  87 PR Interval:    QRS Duration:  146 QT Interval:  410 QTC Calculation: 493 R  Axis:   -70  Text Interpretation: Atrial fibrillation with premature ventricular or aberrantly conducted complexes Left axis deviation Right bundle branch block When compared with ECG of 10-Mar-2023 18:12, PREVIOUS ECG IS PRESENT Confirmed by Lawana Pray [phone]) on 07/07/2023 1:38:03 PM     Narrative TRANSESOPHOGEAL ECHO REPORT       Patient Name:   Luverne Salvia Date of Exam: 09/24/2022 Medical Rec #:  [id number]       Height:       70.0 in Accession #:    [id number]      Weight:       198.6 lb Date of Birth:  Apr 12, 1934        BSA:          2.081 m Patient Age:    88 years        BP:           100/68 mmHg Patient Gender: M               HR:           74 bpm. Exam Location:  Inpatient   Procedure: TEE-Intraopertive   Indications:    Watchman   History:        Patient has prior history of Echocardiogram examinations. Arrythmias:Atrial Fibrillation.   Sonographer:    Astrid Blamer Referring Phys: [id number] Boyce Byes   PROCEDURE: The transesophogeal probe was passed without difficulty through the esophogus of the patient. Sedation performed by different physician. The patient's vital signs; including heart rate, blood pressure, and oxygen saturation; remained stable throughout the procedure. The patient developed no complications during the procedure.   IMPRESSIONS     1. Brocolli appendage with no thrombus. Well positioned 27 mm Watchman FLX device with 1/3 mitral shoulder. No leak by color flow. Negative tugg test. Average compression 16%. 2. Post trans septal puncture only left to right shunting. 3. Left ventricular ejection fraction, by estimation, is 35%. The left ventricle has normal function. The left ventricle demonstrates global hypokinesis. The left ventricular internal cavity size was moderately dilated. 4. Right ventricular systolic function is mildly reduced. The right ventricular size is mildly enlarged. 5. Left atrial size was mildly dilated. No left  atrial/left atrial appendage thrombus was detected. 6. Right atrial size was moderately dilated. 7. A small pericardial effusion is present. The pericardial effusion is posterior to the left ventricle. 8. The mitral valve is normal in structure. Trivial mitral valve regurgitation. No evidence of mitral  stenosis. 9. The aortic valve is tricuspid. There is moderate calcification of the aortic valve. There is moderate thickening of the aortic valve. Aortic valve regurgitation is moderate. Mild aortic valve stenosis. 10. The inferior vena cava is normal in size with greater than 50% respiratory variability, suggesting right atrial pressure of 3 mmHg.   Conclusion(s)/Recommendation(s): Normal biventricular function without evidence of hemodynamically significant valvular heart disease.   FINDINGS Left Ventricle: Left ventricular ejection fraction, by estimation, is 35%. The left ventricle has normal function. The left ventricle demonstrates global hypokinesis. The left ventricular internal cavity size was moderately dilated. There is no left ventricular hypertrophy.   Right Ventricle: The right ventricular size is mildly enlarged. No increase in right ventricular wall thickness. Right ventricular systolic function is mildly reduced.   Left Atrium: Left atrial size was mildly dilated. No left atrial/left atrial appendage thrombus was detected.   Right Atrium: Right atrial size was moderately dilated.   Pericardium: A small pericardial effusion is present. The pericardial effusion is posterior to the left ventricle.   Mitral Valve: The mitral valve is normal in structure. Trivial mitral valve regurgitation. No evidence of mitral valve stenosis.   Tricuspid Valve: The tricuspid valve is normal in structure. Tricuspid valve regurgitation is mild . No evidence of tricuspid stenosis.   Aortic Valve: The aortic valve is tricuspid. There is moderate calcification of the aortic valve. There is moderate  thickening of the aortic valve. Aortic valve regurgitation is moderate. Aortic regurgitation PHT measures 619 msec. Mild aortic stenosis is present. Aortic valve mean gradient measures 7.0 mmHg. Aortic valve peak gradient measures 9.9 mmHg. Aortic valve area, by VTI measures 1.60 cm.   Pulmonic Valve: The pulmonic valve was normal in structure. Pulmonic valve regurgitation is trivial. No evidence of pulmonic stenosis.   Aorta: The aortic root is normal in size and structure.   Venous: The inferior vena cava is normal in size with greater than 50% respiratory variability, suggesting right atrial pressure of 3 mmHg.   IAS/Shunts: No atrial level shunt detected by color flow Doppler.     Assessment & Plan   1.  Atrial fibrillation-EKG today shows afib 87 bpm .  Status post Watchman 09/24/22.  Reports compliance with aspirin  post procedure. Avoid triggers caffeine, chocolate, EtOH, dehydration etc. Continue aspirin  Will reach out to Dr. Marven Slimmer to discuss situation/DCCV and protocol.  HFrEF-no increased work of breathing.  Continues to be able to play golf. Continue metoprolol , Entresto , furosemide , Farxiga  Heart healthy low-sodium diet Weight log, daily weights  Essential hypertension-BP today 112/62. Maintain blood pressure log Continue metoprolol , spironolactone , Entresto   COPD-breathing stable. Follows with PCP  Disposition: Follow-up with Caitlin as scheduled.   Chet Cota. Xyla Leisner NP-C     07/07/2023, 1:38 PM  [facility] [address] Office [phone] Fax [phone]    I spent 20 minutes examining this patient, reviewing medications, and using patient centered shared decision making involving their cardiac care.   I spent  20 minutes reviewing past medical history,  medications, and prior cardiac tests.

## 2023-08-25 ENCOUNTER — Other Ambulatory Visit (HOSPITAL_COMMUNITY): Payer: Medicare Other

## 2023-08-27 ENCOUNTER — Telehealth: Admitting: Physician Assistant

## 2023-08-27 ENCOUNTER — Ambulatory Visit: Payer: Self-pay

## 2023-08-27 DIAGNOSIS — H9202 Otalgia, left ear: Secondary | ICD-10-CM

## 2023-08-27 DIAGNOSIS — J069 Acute upper respiratory infection, unspecified: Secondary | ICD-10-CM

## 2023-08-27 DIAGNOSIS — J029 Acute pharyngitis, unspecified: Secondary | ICD-10-CM

## 2023-08-27 NOTE — Telephone Encounter (Signed)
 Please offer 4pm appt for this Friday

## 2023-08-27 NOTE — Telephone Encounter (Signed)
 FYI Only or Action Required?: FYI only for provider.  Patient was last seen in primary care on 03/13/2023 by Rilla Baller, MD.  Called Nurse Triage reporting Sore Throat.  Symptoms began several days ago.  Interventions attempted: Nothing.  Symptoms are: gradually worsening.  Triage Disposition: Home Care  Patient/caregiver understands and will follow disposition?: Yes       Patient is having pain swallowing and the pain travels into his left ear. He says that there is not pain unless he swallows. He is aware of appointments being far out until next week.    Reason for Disposition  [1] Sore throat is the only symptom AND [2] sore throat present < 48 hours  Answer Assessment - Initial Assessment Questions 1. ONSET: When did the throat start hurting? (Hours or days ago)      3 days ago  2. SEVERITY: How bad is the sore throat? (Scale 1-10; mild, moderate or severe)     Mod- only hurts when swallowing 3. STREP EXPOSURE: Has there been any exposure to strep within the past week? If Yes, ask: What type of contact occurred?      no 4.  VIRAL SYMPTOMS: Are there any symptoms of a cold, such as a runny nose, cough, hoarse voice or red eyes?      Runny nose 5. FEVER: Do you have a fever? If Yes, ask: What is your temperature, how was it measured, and when did it start?     no 6. PUS ON THE TONSILS: Is there pus on the tonsils in the back of your throat?     no 7. OTHER SYMPTOMS: Do you have any other symptoms? (e.g., difficulty breathing, headache, rash)     Left  Protocols used: Sore Throat-A-AH

## 2023-08-27 NOTE — Progress Notes (Signed)
 Virtual Visit Consent   Theodore [name], you are scheduled for a virtual visit with a [facility] provider today. Just as with appointments in the office, your consent must be obtained to participate. Your consent will be active for this visit and any virtual visit you may have with one of our providers in the next 365 days. If you have a MyChart account, a copy of this consent can be sent to you electronically.  As this is a virtual visit, video technology does not allow for your provider to perform a traditional examination. This may limit your provider's ability to fully assess your condition. If your provider identifies any concerns that need to be evaluated in person or the need to arrange testing (such as labs, EKG, etc.), we will make arrangements to do so. Although advances in technology are sophisticated, we cannot ensure that it will always work on either your end or our end. If the connection with a video visit is poor, the visit may have to be switched to a telephone visit. With either a video or telephone visit, we are not always able to ensure that we have a secure connection.  By engaging in this virtual visit, you consent to the provision of healthcare and authorize for your insurance to be billed (if applicable) for the services provided during this visit. Depending on your insurance coverage, you may receive a charge related to this service.  I need to obtain your verbal consent now. Are you willing to proceed with your visit today? Theodore [name] has provided verbal consent on 08/27/2023 for a virtual visit (video or telephone). Teena Shuck, [place]  Date: 08/27/2023 6:25 PM   Virtual Visit via Video Note   I, Teena Shuck, connected with  Theodore [name]  ([id number], February 18, 1934) on 08/27/23 at  6:15 PM EDT by a video-enabled telemedicine application and verified that I am speaking with the correct person using two identifiers.  Location: Patient: Virtual Visit Location Patient:  Home Provider: Virtual Visit Location Provider: Home Office   I discussed the limitations of evaluation and management by telemedicine and the availability of in person appointments. The patient expressed understanding and agreed to proceed.    History of Present Illness: Theodore [name] is a 89 y.o. who identifies as a male who was assigned male at birth, and is being seen today for sore throat.  HPI: Sore Throat  This is a new problem. The pain is worse on the left side. There has been no fever. The pain is moderate. Associated symptoms include a plugged ear sensation. He has tried nothing for the symptoms. The treatment provided no relief.    Problems:  Patient Active Problem List   Diagnosis Date Noted   Chronic venous insufficiency 03/31/2023   Decreased bone density 03/26/2023   Acute leg pain, left 03/13/2023   Presence of Watchman left atrial appendage closure device 09/24/2022   Chronic combined systolic and diastolic CHF (congestive heart failure) (HCC) 08/05/2022   Hearing loss due to cerumen impaction, right 02/27/2022   Secondary hypercoagulable state (HCC) 01/13/2022   Atypical atrial flutter (HCC) 12/31/2021   Unintended weight loss 02/15/2021   Polycythemia 02/12/2021   Macrocytosis 02/12/2021   Dysphagia 02/12/2021   Back pain 12/20/2020   Medicare annual wellness visit, subsequent 10/08/2020   Iron  deficiency 02/06/2020   Aortic regurgitation 11/01/2019   Diminished pulses in lower extremity 08/16/2019   Peripheral neuropathy 06/13/2019   Burning sensation of foot 03/09/2019   Thoracic  aorta atherosclerosis (HCC) 01/19/2018   Nonischemic cardiomyopathy (HCC) 03/03/2017   Chronic anticoagulation 03/03/2017   History of pulmonary embolism 03/03/2017   Chronic knee pain after total replacement of left knee joint 02/13/2017   History of total knee replacement, left 02/09/2017   Overweight (BMI 25.0-29.9) 01/13/2017   History of rheumatic fever    Health  maintenance examination 02/06/2016   Advanced care planning/counseling discussion 02/06/2016   CAD S/P percutaneous coronary angioplasty 09/28/2015   Fatigue 08/07/2015   Abnormal nuclear stress test 09/06/2014   Chronic cough 08/24/2014   Thrombocytopenia (HCC) 05/27/2013   PVC's (premature ventricular contractions) 09/22/2012   Osteoarthritis 06/18/2012   ED (erectile dysfunction) of organic origin 12/29/2011   Enlarged prostate with lower urinary tract symptoms (LUTS) 12/29/2011   Reduced libido 12/29/2011   Testicular hypofunction 12/29/2011   Allergic-infective asthma 08/26/2011   GERD (gastroesophageal reflux disease) 08/26/2011   Atrial fibrillation (HCC) 01/21/2011   Nocturia    Right bundle branch block    COPD mixed type (HCC) 12/28/2007   HLD (hyperlipidemia) 11/27/2007   Essential hypertension 11/27/2007    Allergies:  Allergies  Allergen Reactions   Penicillins Anaphylaxis, Rash and Other (See Comments)    Blistering rash Has patient had a PCN reaction causing immediate rash, facial/tongue/throat swelling, SOB or lightheadedness with hypotension: Yes Has patient had a PCN reaction causing severe rash involving mucus membranes or skin necrosis: No Has patient had a PCN reaction that required hospitalization: No Has patient had a PCN reaction occurring within the last 10 years: No If all of the above answers are NO, then may proceed with Cephalosporin use.    Amlodipine  Swelling and Other (See Comments)    Peripheral edema   Effexor  [Venlafaxine ] Other (See Comments)    Tremors/shaking   Lisinopril Cough   Mevacor [Lovastatin] Other (See Comments)    Caused cataracts and elevated liver enzymes   Vicodin [Hydrocodone -Acetaminophen ] Nausea And Vomiting   Zocor [Simvastatin] Other (See Comments)    LFT increase with simvastatin and lovastatin    Tamiflu [Oseltamivir Phosphate] Itching and Rash   Medications:  Current Outpatient Medications:    acetaminophen   (TYLENOL ) 500 MG tablet, Take 1 tablet (500 mg total) by mouth 3 (three) times daily as needed., Disp: 30 tablet, Rfl: 0   aspirin  EC 81 MG tablet, Take 1 tablet (81 mg total) by mouth daily. Swallow whole., Disp: , Rfl:    atorvastatin  (LIPITOR) 40 MG tablet, Take 1 tablet (40 mg total) by mouth daily., Disp: 90 tablet, Rfl: 3   BREZTRI  AEROSPHERE 160-9-4.8 MCG/ACT AERO, USE 2 INHALATIONS BY MOUTH TWICE DAILY, Disp: 32.1 g, Rfl: 3   Cholecalciferol  (VITAMIN D3) 2000 units TABS, Take 2,000 Units by mouth daily., Disp: , Rfl:    cyanocobalamin  (VITAMIN B12) 500 MCG tablet, Take 1 tablet (500 mcg total) by mouth every Monday, Wednesday, and Friday., Disp: , Rfl:    ezetimibe  (ZETIA ) 10 MG tablet, TAKE 1 TABLET BY MOUTH DAILY, Disp: 90 tablet, Rfl: 3   FARXIGA  10 MG TABS tablet, TAKE 1 TABLET BY MOUTH DAILY  BEFORE BREAKFAST, Disp: 90 tablet, Rfl: 3   furosemide  (LASIX ) 20 MG tablet, Take 1 tablet (20 mg total) by mouth daily., Disp: 90 tablet, Rfl: 3   gabapentin  (NEURONTIN ) 300 MG capsule, Take 900 mg by mouth at bedtime., Disp: , Rfl:    ipratropium-albuterol  (DUONEB) 0.5-2.5 (3) MG/3ML SOLN, 1 neb every 6 hours as needed, Disp: 360 mL, Rfl: 12   metoprolol  succinate (TOPROL -XL)  50 MG 24 hr tablet, Take 1 tablet (50 mg total) by mouth daily. Take with or immediately following a meal., Disp: 90 tablet, Rfl: 3   pantoprazole  (PROTONIX ) 40 MG tablet, Take 1 tablet (40 mg total) by mouth daily., Disp: 90 tablet, Rfl: 4   potassium chloride  (KLOR-CON  M) 10 MEQ tablet, Take 1 tablet (10 mEq total) by mouth daily., Disp: , Rfl:    sacubitril -valsartan  (ENTRESTO ) 24-26 MG, Take 1 tablet by mouth 2 (two) times daily., Disp: 180 tablet, Rfl: 3   spironolactone  (ALDACTONE ) 25 MG tablet, Take 0.5 tablets by mouth daily., Disp: , Rfl:    traZODone  (DESYREL ) 50 MG tablet, TAKE 1/2 TO 1 TABLET BY MOUTH AT BEDTIME AS NEEDED FOR SLEEP, Disp: 90 tablet, Rfl: 3   vitamin C  (ASCORBIC ACID ) 500 MG tablet, Take 500 mg by  mouth daily., Disp: , Rfl:   Observations/Objective: Patient is well-developed, well-nourished in no acute distress.  Resting comfortably  at home.  Head is normocephalic, atraumatic.  No labored breathing.  Speech is clear and coherent with logical content.  Patient is alert and oriented at baseline.    Assessment and Plan: 1. Left ear pain (Primary)  2. Sore throat  3. Viral URI  Patient presenting with sore throat x 3 days and associated ear pain. Unable to visualize via telehealth medium. Suspect COVID vs Flu as patient traveled recommended in person for proper visualization and exam. Noa cute or eergent conditions noted during call.   Follow Up Instructions: I discussed the assessment and treatment plan with the patient. The patient was provided an opportunity to ask questions and all were answered. The patient agreed with the plan and demonstrated an understanding of the instructions.  A copy of instructions were sent to the patient via MyChart unless otherwise noted below.     The patient was advised to call back or seek an in-person evaluation if the symptoms worsen or if the condition fails to improve as anticipated.    Teena Shuck, PA-C

## 2023-08-27 NOTE — Patient Instructions (Signed)
 Theodore [name], thank you for joining Teena Shuck, PA-C for today's virtual visit.  While this provider is not your primary care provider (PCP), if your PCP is located in our provider database this encounter information will be shared with them immediately following your visit.   A [facility] MyChart account gives you access to today's visit and all your visits, tests, and labs performed at [facility]  click here if you don't have a [facility] MyChart account or go to [email or web address]  Consent: (Patient) Theodore [name] provided verbal consent for this virtual visit at the beginning of the encounter.  Current Medications:  Current Outpatient Medications:    acetaminophen  (TYLENOL ) 500 MG tablet, Take 1 tablet (500 mg total) by mouth 3 (three) times daily as needed., Disp: 30 tablet, Rfl: 0   aspirin  EC 81 MG tablet, Take 1 tablet (81 mg total) by mouth daily. Swallow whole., Disp: , Rfl:    atorvastatin  (LIPITOR) 40 MG tablet, Take 1 tablet (40 mg total) by mouth daily., Disp: 90 tablet, Rfl: 3   BREZTRI  AEROSPHERE 160-9-4.8 MCG/ACT AERO, USE 2 INHALATIONS BY MOUTH TWICE DAILY, Disp: 32.1 g, Rfl: 3   Cholecalciferol  (VITAMIN D3) 2000 units TABS, Take 2,000 Units by mouth daily., Disp: , Rfl:    cyanocobalamin  (VITAMIN B12) 500 MCG tablet, Take 1 tablet (500 mcg total) by mouth every Monday, Wednesday, and Friday., Disp: , Rfl:    ezetimibe  (ZETIA ) 10 MG tablet, TAKE 1 TABLET BY MOUTH DAILY, Disp: 90 tablet, Rfl: 3   FARXIGA  10 MG TABS tablet, TAKE 1 TABLET BY MOUTH DAILY  BEFORE BREAKFAST, Disp: 90 tablet, Rfl: 3   furosemide  (LASIX ) 20 MG tablet, Take 1 tablet (20 mg total) by mouth daily., Disp: 90 tablet, Rfl: 3   gabapentin  (NEURONTIN ) 300 MG capsule, Take 900 mg by mouth at bedtime., Disp: , Rfl:    ipratropium-albuterol  (DUONEB) 0.5-2.5 (3) MG/3ML SOLN, 1 neb every 6 hours as needed, Disp: 360 mL, Rfl: 12   metoprolol  succinate (TOPROL -XL) 50 MG 24 hr tablet,  Take 1 tablet (50 mg total) by mouth daily. Take with or immediately following a meal., Disp: 90 tablet, Rfl: 3   pantoprazole  (PROTONIX ) 40 MG tablet, Take 1 tablet (40 mg total) by mouth daily., Disp: 90 tablet, Rfl: 4   potassium chloride  (KLOR-CON  M) 10 MEQ tablet, Take 1 tablet (10 mEq total) by mouth daily., Disp: , Rfl:    sacubitril -valsartan  (ENTRESTO ) 24-26 MG, Take 1 tablet by mouth 2 (two) times daily., Disp: 180 tablet, Rfl: 3   spironolactone  (ALDACTONE ) 25 MG tablet, Take 0.5 tablets by mouth daily., Disp: , Rfl:    traZODone  (DESYREL ) 50 MG tablet, TAKE 1/2 TO 1 TABLET BY MOUTH AT BEDTIME AS NEEDED FOR SLEEP, Disp: 90 tablet, Rfl: 3   vitamin C  (ASCORBIC ACID ) 500 MG tablet, Take 500 mg by mouth daily., Disp: , Rfl:    Medications ordered in this encounter:  No orders of the defined types were placed in this encounter.    *If you need refills on other medications prior to your next appointment, please contact your pharmacy*  Follow-Up: Call back or seek an in-person evaluation if the symptoms worsen or if the condition fails to improve as anticipated.  [facility] [phone]  Other Instructions Report to ER or Urgent Care for evaluation.    If you have been instructed to have an in-person evaluation today at a local Urgent Care facility, please use the link  below. It will take you to a list of all of our available [facility], including address, phone number and hours of operation. Please do not delay care.  [facility]  If you or a family member do not have a primary care provider, use the link below to schedule a visit and establish care. When you choose a [facility] primary care physician or advanced practice provider, you gain a long-term partner in health. Find a Primary Care Provider  Learn more about [facility]'s in-office and virtual care options: [facility] Get Care Now

## 2023-08-28 ENCOUNTER — Ambulatory Visit: Admitting: Family Medicine

## 2023-08-28 VITALS — BP 110/72 | HR 62 | Temp 97.5°F | Ht 71.0 in | Wt 198.1 lb

## 2023-08-28 DIAGNOSIS — J029 Acute pharyngitis, unspecified: Secondary | ICD-10-CM

## 2023-08-28 DIAGNOSIS — J020 Streptococcal pharyngitis: Secondary | ICD-10-CM | POA: Diagnosis not present

## 2023-08-28 LAB — POC COVID19 BINAXNOW: SARS Coronavirus 2 Ag: NEGATIVE

## 2023-08-28 LAB — POCT RAPID STREP A (OFFICE): Rapid Strep A Screen: POSITIVE — AB

## 2023-08-28 MED ORDER — AZITHROMYCIN 250 MG PO TABS: ORAL_TABLET | ORAL | 0 refills | Status: AC

## 2023-08-28 NOTE — Telephone Encounter (Signed)
 Spoke with pt offering acute OV today at 4:00 for sore throat. Pt accepts. Asked pt to check in at 3:45, he verbalizes understanding.   Will have pt added to schedule.

## 2023-08-28 NOTE — Progress Notes (Signed)
 Ph: [phone] Fax: [phone]   Patient ID: Theodore [name], male    DOB: 07-24-1934, 89 y.o.   MRN: [id number]  This visit was conducted in person.  BP 110/72   Pulse 62   Temp (!) 97.5 F (36.4 C) (Oral)   Ht 5' 11 (1.803 m)   Wt 198 lb 2 oz (89.9 kg)   SpO2 99%   BMI 27.63 kg/m    CC: ST  Subjective:   HPI: Theodore [name] is a 89 y.o. male presenting on 08/28/2023 for Sore Throat (Throat has been hurting for 3 days. Been on a clear liquid diet for 2 days. /)   3d h/o LEFT sided ST worse when swallowing. Acutely worse over the past few days but today starting to feel better. Last night felt like razor blades to throat. Today it's not feeling as bad as last night but stop painful to swallow. Has been following liquid diet due to this. Also having malaise, fatigue, body aches, left earache only when swallowing. Mild congestion, PN drainage.   Recent family reunion.  DIL sick with similar symptoms   First day of symptoms: 08/25/2023 Current symptoms: see above No fevers/chills, cough, abd pain, nausea Treatments to date: tylenol   Risk factors include: age, heart disease, hypertension, COPD  COVID vaccination status: ~7   Confirms he is not taking spironolactone .     Relevant past medical, surgical, family and social history reviewed and updated as indicated. Interim medical history since our last visit reviewed. Allergies and medications reviewed and updated. Outpatient Medications Prior to Visit  Medication Sig Dispense Refill   acetaminophen  (TYLENOL ) 500 MG tablet Take 1 tablet (500 mg total) by mouth 3 (three) times daily as needed. 30 tablet 0   aspirin  EC 81 MG tablet Take 1 tablet (81 mg total) by mouth daily. Swallow whole.     atorvastatin  (LIPITOR) 40 MG tablet Take 1 tablet (40 mg total) by mouth daily. 90 tablet 3   BREZTRI  AEROSPHERE 160-9-4.8 MCG/ACT AERO USE 2 INHALATIONS BY MOUTH TWICE DAILY 32.1 g 3   Cholecalciferol  (VITAMIN D3) 2000 units TABS  Take 2,000 Units by mouth daily.     cyanocobalamin  (VITAMIN B12) 500 MCG tablet Take 1 tablet (500 mcg total) by mouth every Monday, Wednesday, and Friday.     ezetimibe  (ZETIA ) 10 MG tablet TAKE 1 TABLET BY MOUTH DAILY 90 tablet 3   FARXIGA  10 MG TABS tablet TAKE 1 TABLET BY MOUTH DAILY  BEFORE BREAKFAST 90 tablet 3   furosemide  (LASIX ) 20 MG tablet Take 1 tablet (20 mg total) by mouth daily. 90 tablet 3   gabapentin  (NEURONTIN ) 300 MG capsule Take 900 mg by mouth at bedtime.     ipratropium-albuterol  (DUONEB) 0.5-2.5 (3) MG/3ML SOLN 1 neb every 6 hours as needed 360 mL 12   metoprolol  succinate (TOPROL -XL) 50 MG 24 hr tablet Take 1 tablet (50 mg total) by mouth daily. Take with or immediately following a meal. 90 tablet 3   pantoprazole  (PROTONIX ) 40 MG tablet Take 1 tablet (40 mg total) by mouth daily. 90 tablet 4   potassium chloride  (KLOR-CON  M) 10 MEQ tablet Take 1 tablet (10 mEq total) by mouth daily.     sacubitril -valsartan  (ENTRESTO ) 24-26 MG Take 1 tablet by mouth 2 (two) times daily. 180 tablet 3   traZODone  (DESYREL ) 50 MG tablet TAKE 1/2 TO 1 TABLET BY MOUTH AT BEDTIME AS NEEDED FOR SLEEP 90 tablet 3   vitamin C  (ASCORBIC ACID )  500 MG tablet Take 500 mg by mouth daily.     spironolactone  (ALDACTONE ) 25 MG tablet Take 0.5 tablets by mouth daily.     No facility-administered medications prior to visit.     Per HPI unless specifically indicated in ROS section below Review of Systems  Objective:  BP 110/72   Pulse 62   Temp (!) 97.5 F (36.4 C) (Oral)   Ht 5' 11 (1.803 m)   Wt 198 lb 2 oz (89.9 kg)   SpO2 99%   BMI 27.63 kg/m   Wt Readings from Last 3 Encounters:  08/28/23 198 lb 2 oz (89.9 kg)  07/07/23 196 lb 9.6 oz (89.2 kg)  05/26/23 193 lb 11.2 oz (87.9 kg)      Physical Exam Vitals and nursing note reviewed.  Constitutional:      Appearance: Normal appearance. He is not ill-appearing.  HENT:     Head: Normocephalic and atraumatic.     Right Ear: Hearing,  tympanic membrane, ear canal and external ear normal. There is no impacted cerumen.     Left Ear: Hearing, tympanic membrane, ear canal and external ear normal. There is no impacted cerumen.     Nose: Nose normal. No mucosal edema, congestion or rhinorrhea.     Right Turbinates: Not enlarged or swollen.     Left Turbinates: Not enlarged or swollen.     Right Sinus: No maxillary sinus tenderness or frontal sinus tenderness.     Left Sinus: No maxillary sinus tenderness or frontal sinus tenderness.     Mouth/Throat:     Mouth: Mucous membranes are moist.     Pharynx: Oropharynx is clear. Posterior oropharyngeal erythema (mild) present. No pharyngeal swelling, oropharyngeal exudate or uvula swelling.     Tonsils: No tonsillar exudate.  Eyes:     Extraocular Movements: Extraocular movements intact.     Conjunctiva/sclera: Conjunctivae normal.     Pupils: Pupils are equal, round, and reactive to light.  Cardiovascular:     Rate and Rhythm: Normal rate and regular rhythm.     Pulses: Normal pulses.     Heart sounds: Normal heart sounds. No murmur heard. Pulmonary:     Effort: Pulmonary effort is normal. No respiratory distress.     Breath sounds: Normal breath sounds. No wheezing, rhonchi or rales.  Musculoskeletal:     Cervical back: Normal range of motion and neck supple. No rigidity.     Right lower leg: No edema.     Left lower leg: No edema.  Lymphadenopathy:     Head:     Right side of head: Submandibular adenopathy present. No submental, tonsillar, preauricular or posterior auricular adenopathy.     Left side of head: Submandibular adenopathy present. No submental, tonsillar, preauricular or posterior auricular adenopathy.     Cervical: No cervical adenopathy.  Skin:    General: Skin is warm and dry.     Findings: No rash.  Neurological:     Mental Status: He is alert.  Psychiatric:        Mood and Affect: Mood normal.        Behavior: Behavior normal.       Results for  orders placed or performed in visit on 08/28/23  POCT rapid strep A   Collection Time: 08/28/23  4:10 PM  Result Value Ref Range   Rapid Strep A Screen Positive (A) Negative  POC COVID-19   Collection Time: 08/28/23  4:50 PM  Result Value Ref Range  SARS Coronavirus 2 Ag Negative Negative    Assessment & Plan:   Problem List Items Addressed This Visit     Strep throat - Primary   RST positive. Amoxicillin allergy. Pt unsure if anaphylaxis - but was hospitalized with blisters to mouth and nose as a child. Rx zpack. Pt states he's recently taken and tolerated well in the past.  Supportive measures as per instructions      Relevant Medications   azithromycin  (ZITHROMAX ) 250 MG tablet   Other Visit Diagnoses       Sore throat       Relevant Orders   POCT rapid strep A (Completed)   POC COVID-19 (Completed)        Meds ordered this encounter  Medications   azithromycin  (ZITHROMAX ) 250 MG tablet    Sig: Take 2 tablets (500 mg total) by mouth daily for 1 day, THEN 1 tablet (250 mg total) daily for 4 days.    Dispense:  6 each    Refill:  0    Orders Placed This Encounter  Procedures   POCT rapid strep A   POC COVID-19    Patient Instructions  You have streptococcal pharyngitis. Take zpack antibiotic Push fluids and plenty of rest. May continue tylenol  for throat pain. Salt water  gargles. Suck on cold things like popsicles or warm things like herbal teas (whichever soothes the throat better). Return if fever >101.5, worsening pain, or trouble opening/closing mouth, or hoarse voice. Good to see you today, call clinic with questions.   Follow up plan: Return if symptoms worsen or fail to improve.  Anton Blas, MD

## 2023-08-28 NOTE — Patient Instructions (Addendum)
 You have streptococcal pharyngitis. Take zpack antibiotic Push fluids and plenty of rest. May continue tylenol  for throat pain. Salt water  gargles. Suck on cold things like popsicles or warm things like herbal teas (whichever soothes the throat better). Return if fever >101.5, worsening pain, or trouble opening/closing mouth, or hoarse voice. Good to see you today, call clinic with questions.

## 2023-08-28 NOTE — Assessment & Plan Note (Addendum)
 RST positive. Amoxicillin allergy. Pt unsure if anaphylaxis - but was hospitalized with blisters to mouth and nose as a child. Rx zpack. Pt states he's recently taken and tolerated well in the past.  Supportive measures as per instructions

## 2023-09-01 ENCOUNTER — Encounter (HOSPITAL_BASED_OUTPATIENT_CLINIC_OR_DEPARTMENT_OTHER): Payer: Self-pay | Admitting: Family

## 2023-09-01 ENCOUNTER — Ambulatory Visit (INDEPENDENT_AMBULATORY_CARE_PROVIDER_SITE_OTHER): Admitting: Family

## 2023-09-01 VITALS — BP 104/60 | HR 62 | Ht 71.0 in | Wt 200.0 lb

## 2023-09-01 DIAGNOSIS — I5042 Chronic combined systolic (congestive) and diastolic (congestive) heart failure: Secondary | ICD-10-CM | POA: Diagnosis not present

## 2023-09-01 DIAGNOSIS — I25118 Atherosclerotic heart disease of native coronary artery with other forms of angina pectoris: Secondary | ICD-10-CM

## 2023-09-01 DIAGNOSIS — E785 Hyperlipidemia, unspecified: Secondary | ICD-10-CM

## 2023-09-01 DIAGNOSIS — I480 Paroxysmal atrial fibrillation: Secondary | ICD-10-CM | POA: Diagnosis not present

## 2023-09-01 DIAGNOSIS — I10 Essential (primary) hypertension: Secondary | ICD-10-CM

## 2023-09-01 DIAGNOSIS — D6859 Other primary thrombophilia: Secondary | ICD-10-CM

## 2023-09-01 MED ORDER — SACUBITRIL-VALSARTAN 24-26 MG PO TABS: 1.0000 | ORAL_TABLET | Freq: Two times a day (BID) | ORAL | 3 refills | Status: AC

## 2023-09-01 MED ORDER — DAPAGLIFLOZIN PROPANEDIOL 10 MG PO TABS: 10.0000 mg | ORAL_TABLET | Freq: Every day | ORAL | 3 refills | Status: AC

## 2023-09-01 MED ORDER — EZETIMIBE 10 MG PO TABS: 10.0000 mg | ORAL_TABLET | Freq: Every day | ORAL | 3 refills | Status: AC

## 2023-09-01 NOTE — Progress Notes (Signed)
 Cardiology Office Note   Date:  09/01/2023  ID:  Theodore [name], DOB 01/04/1935, MRN [id number] PCP: Rilla Baller, MD  [facility] Providers Cardiologist:  Annabella Scarce, MD Electrophysiologist:  Soyla Gladis Norton, MD     History of Present Illness Theodore [name] is a 89 y.o. male with a hx of CAD s/p LAD PCI, chronic systolic heart failure, HTN, HLD, persistent atrial fibrillation s/p ablation, s/p Watchman, asthma, LLE DVT, PE, neuropathy,rheumatic fever.   Previous patient of Dr. Dominick. Diagnosed with atrial fibrillation in 2014. Ablation 12/19/15 by Dr. Norton. Myoview  08/2014 for chest pain with old inferior scar with peri infarct ischemia and LVEF 39%. LHC 70% osital LAD, 50% prox LAD, 30% OM1 and 35% RCA. Recommended for medical management. Repeat LHC 09/28/15 with 70% ostial prox LAD lesion with no change in FFR. Left ventriculogram LVEF 35-40%. Improved to LVEF 50% on 03/2018. LHC 01/10/2019 and due to progression of disease, underwent atherectomy and PCI with DES. Prior myalgia on Atorvastatin  80mg  but tolerates 40mg  and Zetia  10mg  daily.    Underwent L knee replacement 02/09/17. Bleeding at incision site, Xarelto  changed to Aspirin . Developed LLE DVT and PE, Xarelto  resumed.    Echo 09/2020 LVEF 55-60%, global hypkinesis, gr2DD. Entresto  and Farxiga  initiated. Echo 12/2020 LVEF 55-60%, indeterminite diastolic function, RA .01/2021 PCSK9i initiated.   Required cardioversion for recurrent atrial fibrillation 01/01/22. Amiodarone  initiated but stopped due to tremor. Of note, tremor did not improve off Amiodarone .   Echo 03/03/22 newly reduced LVEF 30-35%, LV global hypokinesis, mild LVH, indeterminate diastolic parameters, RV normal, mild MR, mild ot moderate AS (mean gradient ). 02/2022 myoview  with no ischemia. Echo 05/13/22 LVEF 30-35%, RV function low normal, trivial MR, mild AI.   Amiodarone  was stopped 07/11/22 with plan for washout with plan to follow up  with Afib clinic in 3 months for Amiodaorne level to consider alternate AAD. 09/24/22 underwent Watchman. TEE during procedure with LVEF 35%.    Previously transitioned from metoprolol  to propranolol  for tremor but felt better on metoprolol  and was resumed.   At visit 04/30/2023 Dr. Scarce  he was recommended to continue present doses of metoprolol , Entresto , Lasix , Farxiga  and doing overall of more cardiac perspective.  He was seen due to recurrent atrial fibrillation 07/07/2023 by Josefa Beauvais, NP but returned to NSR without cardioversion.   He presents today for follow up independently. 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  Reports feeling overall well since last seen.  Reports no chest pain, exertional dyspnea, , orthopnea, PND, palpitations.  Staying active and following a low-sodium, heart healthy diet.  He is did note some Trend for fluid retention due to some mild ankle edema by end of day and weight gain of 3-4 lbs recently.    ROS: Please see the history of present illness.    All other systems reviewed and are negative.   Studies Reviewed      Cardiac Studies & Procedures   ______________________________________________________________________________________________ CARDIAC CATHETERIZATION  CARDIAC CATHETERIZATION 01/11/2019  Conclusion  Prox LAD to Mid LAD lesion is 80% stenosed.  A drug-eluting stent was successfully placed using a STENT RESOLUTE ONYX 3.5X18.  Post intervention, there is a 0% residual stenosis.  Mid LAD lesion is 70% stenosed.  A drug-eluting stent was successfully placed using a STENT RESOLUTE ONYX 2.75X26.  Post intervention, there is a 0% residual stenosis.  1. Severe, heavily calcified stenoses in the proximal and mid LAD 2. Successful orbital  atherectomy of the proximal and mid LAD 3. Successful placement of 2 drug eluting stents in the proximal and mid LAD (overlapping)  Recommendations: Continue ASA and  Plavix  for at least six months.  Findings Coronary Findings Diagnostic  Dominance: Right  Left Anterior Descending Prox LAD to Mid LAD lesion is 80% stenosed. The lesion is calcified. Mid LAD lesion is 70% stenosed. The lesion is calcified.  Intervention  Prox LAD to Mid LAD lesion Stent CATH VISTA GUIDE 6FR XBLAD3.5 guide catheter was inserted. Lesion crossed with guidewire using a WIRE COUGAR XT STRL 190CM. Pre-stent angioplasty was performed using a BALLOON SAPPHIRE 2.5X20. A drug-eluting stent was successfully placed using a STENT RESOLUTE ONYX 3.5X18. Stent strut is well apposed. Post-stent angioplasty was performed using a BALLOON SAPPHIRE [place] 3.75X8. Post-Intervention Lesion Assessment The intervention was successful. Pre-interventional TIMI flow is 3. Post-intervention TIMI flow is 3. No complications occurred at this lesion. There is a 0% residual stenosis post intervention.  Mid LAD lesion Stent Pre-stent angioplasty was performed using a BALLOON SAPPHIRE 2.5X20. A drug-eluting stent was successfully placed using a STENT RESOLUTE ONYX 2.75X26. Stent strut is well apposed. Post-stent angioplasty was performed using a BALLOON SAPPHIRE [place] 3.0X8. Post-Intervention Lesion Assessment The intervention was successful. Pre-interventional TIMI flow is 3. Post-intervention TIMI flow is 3. No complications occurred at this lesion. There is a 0% residual stenosis post intervention.   CARDIAC CATHETERIZATION 01/10/2019  Conclusion  Prox LAD lesion is 80% stenosed. Prox LAD to Mid LAD lesion is 60% stenosed with 50% stenosed side branch in 2nd Diag.  Dist LAD lesion is 45% stenosed.  Ost Cx to Prox Cx lesion is 10% stenosed. 1st Mrg lesion is 30% stenosed. Prox Cx to Mid Cx lesion is 30% stenosed.  -----------  Dist RCA lesion is 30% stenosed with 30% stenosed side branch in RPDA.  -----------  The left ventricular systolic function is normal. The left ventricular ejection  fraction is 55-65% by visual estimate.  LV end diastolic pressure is moderately elevated.  SUMMARY  Diffuse calcified coronary disease with severe single-vessel disease involving the proximal LAD -> 75 to 80% followed by extensive 50% stenosis that is DFR positive at 0.82.  This represents significant progression of disease from 2017  Otherwise mild to moderate disease.  Normal LVEF with moderately elevated LVEDP.   RECOMMENDATIONS  With proximal LAD disease, heavily calcified, this will require atherectomy PCI.  Given the issues with holding Xarelto , and COVID-19 screening test, I feel the best course of action is to place the patient in the hospital tonight and plan for atherectomy tomorrow with Dr. Lonni Cash. ->  We will load Plavix  today 300 mg daily, along with aspirin  324 mg.  Would likely be L to restart Xarelto  tomorrow night  Continue other home medications.    Alm Clay, M.D., M.S. Interventional Cardiologist  Pager # [phone] Phone # [phone] [address] [place], [place] [address]  Findings Coronary Findings Diagnostic  Dominance: Right  Left Main Vessel is large. The vessel exhibits minimal luminal irregularities. The vessel is mildly calcified.  Left Anterior Descending Prox LAD lesion is 80% stenosed. The lesion is type C, located proximal to the major branch, eccentric and concentric. The lesion is severely calcified. Pressure wire/FFR was performed on the lesion. DFR 0.82 Prox LAD to Mid LAD lesion is 60% stenosed with 50% stenosed side branch in 2nd Diag. The lesion is type C, tubular and eccentric. The lesion is calcified. Dist LAD lesion is 45%  stenosed. The lesion is tubular and smooth. With some spasm component responsive to nitroglycerin   First Diagonal Branch Vessel is small in size.  Second Diagonal Branch Vessel is moderate in size.  Lateral Second Diagonal Branch Vessel is small in size.  Left  Circumflex Small vessel after the OM takeoff. Ost Cx to Prox Cx lesion is 10% stenosed. The lesion is mildly calcified. Prox Cx to Mid Cx lesion is 30% stenosed.  First Obtuse Marginal Branch Vessel is small in size. Is a very small proximal branch occlusion. 1st Mrg lesion is 30% stenosed.  Second Obtuse Marginal Branch Vessel is small in size.  First Left Posterolateral Branch Vessel is small in size.  Left Posterior Atrioventricular Artery Vessel is small in size.  Right Coronary Artery Vessel is large. There is mild diffuse disease throughout the vessel. Dist RCA lesion is 30% stenosed with 30% stenosed side branch in RPDA.  Acute Marginal Branch Vessel is small in size. Vessel is angiographically normal.  Right Ventricular Branch Vessel is small in size.  Right Posterior Descending Artery Vessel is moderate in size.  Right Posterior Atrioventricular Artery Vessel is small in size.  First Right Posterolateral Branch Vessel is small in size.  Second Right Posterolateral Branch  Third Right Posterolateral Branch Vessel is small in size.  Intervention  No interventions have been documented.   STRESS TESTS  NM PET CT CARDIAC PERFUSION MULTI W/ABSOLUTE BLOODFLOW 04/09/2023  Narrative   LV perfusion is normal.   Rest left ventricular function is abnormal. Rest global function is moderately reduced. Rest EF: 39%. Stress left ventricular function is abnormal. Stress global function is moderately reduced. Stress EF: 39%. End diastolic cavity size is normal.   Myocardial blood flow reserve is not reported in this patient due to technical or patient-specific concerns that affect accuracy (systolic dysfunction).   Coronary calcium  assessment not performed due to prior revascularization.  Incidental note is made of trivial pericardial effusion versus pericardial thickening seen on the attenuation correction CT.   Findings are consistent with no ischemia and no  infarction. The study is intermediate risk due to reduced LVEF.   Electronically signed by: Lonni Hanson, MD.  CLINICAL DATA:  This over-read does not include interpretation of cardiac or coronary anatomy or pathology. The Cardiac PET CT interpretation by the cardiologist is attached.  COMPARISON:  CT coronary angiogram, 11/25/2022  FINDINGS: Cardiovascular: Scattered aortic atherosclerosis. Aortic valve calcifications. Cardiomegaly. Three-vessel coronary artery calcifications and stents. Watchman type left atrial appendage occlusion device. No pericardial effusion.  Limited Mediastinum/Nodes: No enlarged mediastinal, hilar, or axillary lymph nodes. Small hiatal hernia. Trachea and esophagus demonstrate no significant findings.  Limited Lungs/Pleura: Mild bibasilar scarring or atelectasis. No pleural effusion or pneumothorax.  Upper Abdomen: No acute abnormality.  Musculoskeletal: No chest wall abnormality. No acute osseous findings.  IMPRESSION: 1. No acute CT findings of the included chest. 2. Cardiomegaly and coronary artery disease. 3. Aortic valve calcifications. Correlate for echocardiographic evidence of aortic valve dysfunction.  Aortic Atherosclerosis (ICD10-I70.0).   Electronically Signed By: Marolyn JONETTA Jaksch M.D. On: 04/09/2023 16:33   ECHOCARDIOGRAM  ECHOCARDIOGRAM COMPLETE 05/13/2022  Narrative ECHOCARDIOGRAM REPORT    Patient Name:   HANS RUSHER Date of Exam: 05/13/2022 Medical Rec #:  [id number]       Height:       71.0 in Accession #:    [id number]      Weight:       194.4 lb Date of Birth:  [birth year]  BSA:          2.083 m Patient Age:    87 years        BP:           110/60 mmHg Patient Gender: M               HR:           61 bpm. Exam Location:  Outpatient  Procedure: 3D Echo, 2D Echo, Cardiac Doppler, Color Doppler and Strain Analysis  Indications:    CHF  History:        Patient has prior history of Echocardiogram examinations,  most recent 03/03/2022. CAD, COPD, Arrythmias:RBBB, PVC and Atrial Flutter; Risk Factors:Hypertension, Dyslipidemia and Non-Smoker.  Sonographer:    Orvil Holmes RDCS Referring Phys: [id number] Kaydence Baba S Annaliese Saez  IMPRESSIONS   1. Left ventricular ejection fraction, by estimation, is 30 to 35%. Left ventricular ejection fraction by 3D volume is 35 %. The left ventricle has moderately decreased function. The left ventricle demonstrates global hypokinesis. The left ventricular internal cavity size was mildly dilated. There is mild left ventricular hypertrophy. Left ventricular diastolic parameters are indeterminate. 2. Right ventricular systolic function is low normal. The right ventricular size is mildly enlarged. 3. Left atrial size was mildly dilated. 4. Right atrial size was moderately dilated. 5. The mitral valve is abnormal. Trivial mitral valve regurgitation. 6. The aortic valve is tricuspid. Aortic valve regurgitation is mild. Aortic valve sclerosis/calcification is present, without any evidence of aortic stenosis. Aortic valve mean gradient measures 8.0 mmHg. 7. The inferior vena cava is normal in size with greater than 50% respiratory variability, suggesting right atrial pressure of 3 mmHg. 8. Rhythm strip during this exam demonstrates frequent PVC's.  Comparison(s): No significant change from prior study. 03/03/2022: LVEF 30-35%, global hypokinesis.  FINDINGS Left Ventricle: Left ventricular ejection fraction, by estimation, is 30 to 35%. Left ventricular ejection fraction by 3D volume is 35 %. The left ventricle has moderately decreased function. The left ventricle demonstrates global hypokinesis. The left ventricular internal cavity size was mildly dilated. There is mild left ventricular hypertrophy. Left ventricular diastolic parameters are indeterminate.  Right Ventricle: The right ventricular size is mildly enlarged. No increase in right ventricular wall thickness. Right  ventricular systolic function is low normal.  Left Atrium: Left atrial size was mildly dilated.  Right Atrium: Right atrial size was moderately dilated.  Pericardium: There is no evidence of pericardial effusion.  Mitral Valve: The mitral valve is abnormal. There is mild thickening of the anterior and posterior mitral valve leaflet(s). Mild mitral annular calcification. Trivial mitral valve regurgitation.  Tricuspid Valve: The tricuspid valve is grossly normal. Tricuspid valve regurgitation is trivial.  Aortic Valve: The aortic valve is tricuspid. Aortic valve regurgitation is mild. Aortic regurgitation PHT measures 795 msec. Aortic valve sclerosis/calcification is present, without any evidence of aortic stenosis. Aortic valve mean gradient measures 8.0 mmHg. Aortic valve peak gradient measures 15.5 mmHg. Aortic valve area, by VTI measures 0.71 cm.  Pulmonic Valve: The pulmonic valve was grossly normal. Pulmonic valve regurgitation is trivial.  Aorta: The aortic root and ascending aorta are structurally normal, with no evidence of dilitation.  Venous: The inferior vena cava is normal in size with greater than 50% respiratory variability, suggesting right atrial pressure of 3 mmHg.  IAS/Shunts: No atrial level shunt detected by color flow Doppler.  EKG: Rhythm strip during this exam demonstrates frequent PVC's.   LEFT VENTRICLE PLAX 2D LVIDd:  5.89 cm         Diastology LVIDs:         4.58 cm         LV e' medial:    4.95 cm/s LV PW:         1.99 cm         LV E/e' medial:  7.1 LV IVS:        1.13 cm         LV e' lateral:   4.88 cm/s LVOT diam:     2.00 cm         LV E/e' lateral: 7.2 LV SV:         35 LV SV Index:   17 LVOT Area:     3.14 cm        3D Volume EF LV 3D EF:    Left ventricul ar ejection fraction by 3D volume is 35 %.  3D Volume EF: 3D EF:        35 % LV EDV:       194 ml LV ESV:       127 ml LV SV:        67 ml  RIGHT VENTRICLE RV Basal  diam:  5.04 cm RV Mid diam:    4.56 cm RV S prime:     10.10 cm/s TAPSE (M-mode): 2.9 cm  LEFT ATRIUM           Index LA diam:      5.80 cm 2.78 cm/m LA Vol (A4C): 63.6 ml 30.53 ml/m AORTIC VALVE AV Area (Vmax):    0.73 cm AV Area (Vmean):   0.71 cm AV Area (VTI):     0.71 cm AV Vmax:           197.00 cm/s AV Vmean:          133.000 cm/s AV VTI:            0.493 m AV Peak Grad:      15.5 mmHg AV Mean Grad:      8.0 mmHg LVOT Vmax:         45.60 cm/s LVOT Vmean:        30.000 cm/s LVOT VTI:          0.111 m LVOT/AV VTI ratio: 0.23 AI PHT:            795 msec  AORTA Ao Root diam: 3.50 cm Ao Asc diam:  3.10 cm  MITRAL VALVE MV Area (PHT): 2.37 cm    SHUNTS MV Decel Time: 320 msec    Systemic VTI:  0.11 m MR Peak grad: 26.8 mmHg    Systemic Diam: 2.00 cm MR Vmax:      259.00 cm/s MV E velocity: 35.10 cm/s MV A velocity: 47.70 cm/s MV E/A ratio:  0.74  Vinie Maxcy MD Electronically signed by Vinie Maxcy MD Signature Date/Time: 05/13/2022/3:26:51 PM    Final   TEE  ECHO TEE 09/24/2022  Narrative TRANSESOPHOGEAL ECHO REPORT    Patient Name:   [name] Date of Exam: 09/24/2022 Medical Rec #:  [id number]       Height:       70.0 in Accession #:    [id number]      Weight:       198.6 lb Date of Birth:  [birth year]        BSA:          2.081 m Patient Age:  88 years        BP:           100/68 mmHg Patient Gender: M               HR:           74 bpm. Exam Location:  Inpatient  Procedure: TEE-Intraopertive  Indications:    Watchman  History:        Patient has prior history of Echocardiogram examinations. Arrythmias:Atrial Fibrillation.  Sonographer:    Jayson Gaskins Referring Phys: [id number] OLE ONEIDA HOLTS  PROCEDURE: The transesophogeal probe was passed without difficulty through the esophogus of the patient. Sedation performed by different physician. The patient's vital signs; including heart rate, blood pressure, and oxygen saturation;  remained stable throughout the procedure. The patient developed no complications during the procedure.  IMPRESSIONS   1. Brocolli appendage with no thrombus. Well positioned 27 mm Watchman FLX device with 1/3 mitral shoulder. No leak by color flow. Negative tugg test. Average compression 16%. 2. Post trans septal puncture only left to right shunting. 3. Left ventricular ejection fraction, by estimation, is 35%. The left ventricle has normal function. The left ventricle demonstrates global hypokinesis. The left ventricular internal cavity size was moderately dilated. 4. Right ventricular systolic function is mildly reduced. The right ventricular size is mildly enlarged. 5. Left atrial size was mildly dilated. No left atrial/left atrial appendage thrombus was detected. 6. Right atrial size was moderately dilated. 7. A small pericardial effusion is present. The pericardial effusion is posterior to the left ventricle. 8. The mitral valve is normal in structure. Trivial mitral valve regurgitation. No evidence of mitral stenosis. 9. The aortic valve is tricuspid. There is moderate calcification of the aortic valve. There is moderate thickening of the aortic valve. Aortic valve regurgitation is moderate. Mild aortic valve stenosis. 10. The inferior vena cava is normal in size with greater than 50% respiratory variability, suggesting right atrial pressure of 3 mmHg.  Conclusion(s)/Recommendation(s): Normal biventricular function without evidence of hemodynamically significant valvular heart disease.  FINDINGS Left Ventricle: Left ventricular ejection fraction, by estimation, is 35%. The left ventricle has normal function. The left ventricle demonstrates global hypokinesis. The left ventricular internal cavity size was moderately dilated. There is no left ventricular hypertrophy.  Right Ventricle: The right ventricular size is mildly enlarged. No increase in right ventricular wall thickness. Right  ventricular systolic function is mildly reduced.  Left Atrium: Left atrial size was mildly dilated. No left atrial/left atrial appendage thrombus was detected.  Right Atrium: Right atrial size was moderately dilated.  Pericardium: A small pericardial effusion is present. The pericardial effusion is posterior to the left ventricle.  Mitral Valve: The mitral valve is normal in structure. Trivial mitral valve regurgitation. No evidence of mitral valve stenosis.  Tricuspid Valve: The tricuspid valve is normal in structure. Tricuspid valve regurgitation is mild . No evidence of tricuspid stenosis.  Aortic Valve: The aortic valve is tricuspid. There is moderate calcification of the aortic valve. There is moderate thickening of the aortic valve. Aortic valve regurgitation is moderate. Aortic regurgitation PHT measures 619 msec. Mild aortic stenosis is present. Aortic valve mean gradient measures 7.0 mmHg. Aortic valve peak gradient measures 9.9 mmHg. Aortic valve area, by VTI measures 1.60 cm.  Pulmonic Valve: The pulmonic valve was normal in structure. Pulmonic valve regurgitation is trivial. No evidence of pulmonic stenosis.  Aorta: The aortic root is normal in size and structure.  Venous: The inferior vena cava is normal in  size with greater than 50% respiratory variability, suggesting right atrial pressure of 3 mmHg.  IAS/Shunts: No atrial level shunt detected by color flow Doppler.  Additional Comments: Brocolli appendage with no thrombus. Well positioned 27 mm Watchman FLX device with 1/3 mitral shoulder. No leak by color flow. Negative tugg test. Average compression 16%. Post trans septal puncture only left to right shunting.  LEFT VENTRICLE PLAX 2D LVOT diam:     2.10 cm LV SV:         65 LV SV Index:   31 LVOT Area:     3.46 cm   AORTIC VALVE AV Area (Vmax):    1.56 cm AV Area (Vmean):   1.53 cm AV Area (VTI):     1.60 cm AV Vmax:           157.50 cm/s AV Vmean:           124.000 cm/s AV VTI:            0.408 m AV Peak Grad:      9.9 mmHg AV Mean Grad:      7.0 mmHg LVOT Vmax:         71.10 cm/s LVOT Vmean:        54.800 cm/s LVOT VTI:          0.189 m LVOT/AV VTI ratio: 0.46 AI PHT:            619 msec   SHUNTS Systemic VTI:  0.19 m Systemic Diam: 2.10 cm  Maude Emmer MD Electronically signed by Maude Emmer MD Signature Date/Time: 09/24/2022/12:02:02 PM    Final  MONITORS  LONG TERM MONITOR (3-14 DAYS) 03/28/2022  Narrative Patch Wear Time:  2 days and 22 hours  Predominant rhythm was sinus rhythm Less than 1% ventricular and supraventricular ectopy Less than 1% supraventricular ectopy 3.6% ventricular ectopy Triggered episodes associated with sinus rhythm PVCs  Will Camnitz, MD       ______________________________________________________________________________________________      Risk Assessment/Calculations  CHA2DS2-VASc Score = 5   This indicates a 7.2% annual risk of stroke. The patient's score is based upon: CHF History: 1 HTN History: 1 Diabetes History: 0 Stroke History: 0 Vascular Disease History: 1 Age Score: 2 Gender Score: 0            Physical Exam VS:  BP 104/60 (BP Location: Right Arm, Patient Position: Sitting, Cuff Size: Normal)   Pulse 62   Ht 5' 11 (1.803 m)   Wt 200 lb (90.7 kg)   SpO2 98%   BMI 27.89 kg/m        Wt Readings from Last 3 Encounters:  09/01/23 200 lb (90.7 kg)  08/28/23 198 lb 2 oz (89.9 kg)  07/07/23 196 lb 9.6 oz (89.2 kg)    GEN: Well nourished, well developed in no acute distress NECK: No JVD; No carotid bruits CARDIAC: RRR, no murmurs, rubs, gallops RESPIRATORY:  Clear to auscultation without rales, wheezing or rhonchi  ABDOMEN: Soft, non-tender, non-distended EXTREMITIES:  No edema; No deformity   ASSESSMENT AND PLAN  HFrEF -concern for volume overload due to some mild ankle edema by end of day and weekend.  Update BMP/BNP.  Continue Lasix  20 mg daily,  Entresto  24-26 mg twice daily, Toprol  50 mg daily, Farxiga  10 mg daily.  Refills provided.  If BNP elevated plan to increase Lasix  to 40 mg daily for 3 days then return to 20 mg daily. Low sodium diet, fluid restriction <2L, and daily weights  encouraged. Educated to contact our office for weight gain of 2 lbs overnight or 5 lbs in one week.   Atrial fibrillation s/p ablation/ s/p Watchman -continue aspirin  EC 81 mg daily post watchman.  Continue Toprol  50 mg daily.  Reports no recent palpitations.  CAD / HLD, LDL goal <70 - Stable with no anginal symptoms. No indication for ischemic evaluation.  GDMT aspirin  81 mg daily, atorvastatin  40 mg daily, Toprol  50 mg daily. Recommend aiming for 150 minutes of moderate intensity activity per week and following a heart healthy diet.    HTN - BP well controlled. Continue current antihypertensive regimen.    Tremor -present manage with metoprolol .  Previously did not feel well on propranolol .  Considering primidone but plans to discuss with pulmonary prior to starting due to concern for adverse effects.       Dispo: follow up in 4 mos  Signed, Reche GORMAN Finder, NP

## 2023-09-01 NOTE — Patient Instructions (Signed)
 Medication Instructions:   Your physician recommends that you continue on your current medications as directed. Please refer to the Current Medication list given to you today.   *If you need a refill on your cardiac medications before your next appointment, please call your pharmacy*  Lab Work:  TODAY!!! BMET/BNP  If you have labs (blood work) drawn today and your tests are completely normal, you will receive your results only by: MyChart Message (if you have MyChart) OR A paper copy in the mail If you have any lab test that is abnormal or we need to change your treatment, we will call you to review the results.  Testing/Procedures:  None ordered.  Follow-Up: At [facility], you and your health needs are our priority.  As part of our continuing mission to provide you with exceptional heart care, our providers are all part of one team.  This team includes your primary Cardiologist (physician) and Advanced Practice Providers or APPs (Physician Assistants and Nurse Practitioners) who all work together to provide you with the care you need, when you need it.  Your next appointment:   4 month(s)  Provider:   Annabella Scarce, MD    We recommend signing up for the patient portal called MyChart.  Sign up information is provided on this After Visit Summary.  MyChart is used to connect with patients for Virtual Visits (Telemedicine).  Patients are able to view lab/test results, encounter notes, upcoming appointments, etc.  Non-urgent messages can be sent to your provider as well.   To learn more about what you can do with MyChart, go to [email or web address].

## 2023-09-02 LAB — BRAIN NATRIURETIC PEPTIDE: BNP: 93.5 pg/mL (ref 0.0–100.0)

## 2023-09-02 LAB — BASIC METABOLIC PANEL WITH GFR
BUN/Creatinine Ratio: 19 (ref 10–24)
BUN: 18 mg/dL (ref 8–27)
CO2: 21 mmol/L (ref 20–29)
Calcium: 9.2 mg/dL (ref 8.6–10.2)
Chloride: 102 mmol/L (ref 96–106)
Creatinine, Ser: 0.96 mg/dL (ref 0.76–1.27)
Glucose: 101 mg/dL — ABNORMAL HIGH (ref 70–99)
Potassium: 4.3 mmol/L (ref 3.5–5.2)
Sodium: 141 mmol/L (ref 134–144)
eGFR: 77 mL/min/1.73 (ref >59)

## 2023-09-03 ENCOUNTER — Ambulatory Visit (HOSPITAL_BASED_OUTPATIENT_CLINIC_OR_DEPARTMENT_OTHER): Payer: Self-pay | Admitting: Family

## 2023-09-24 ENCOUNTER — Ambulatory Visit: Payer: Medicare Other | Admitting: Internal Medicine

## 2023-10-15 ENCOUNTER — Encounter: Payer: Self-pay | Admitting: Family Medicine

## 2023-11-09 NOTE — Progress Notes (Unsigned)
 Subjective:    Patient ID: Theodore [name], male    DOB: 02/26/1934, 89 y.o.   MRN: [id number] HPI male never smoker followed for COPD/bronchitis, complicated by HBP, peripheral edema, GERD, A. Fib/ ablation, CAD, PE/DVT,  PFT 2011 FEV1/FVC 0.65.    PFT 11/30/13- moderate obstruction, insignificant response to bronchodilator, airtrapping, minimal diffusion defect. FEV1 2.21/ 70%, FEV1/FVC 0.63, TLC 97%, DLCO 78%. EKG 03/19/2018-sinus bradycardia with frequent multifocal PVCs/ventricular trigeminy PFT 10/15/2018- Moderate obstruction with response to BD, Airtrapping, Normal Diffusion (Chronic bronchitis pattern) -----------------------------------------------------------------------------------------  09/18/22-  89 year old male never smoker followed for COPD/bronchitis, complicated by HBP, peripheral edema, GERD, A. Fib/ Xarelto , PE/DVT (2019), CAD/ stent, CHF,  Peripheral Neuropathy, Thrombocytopenia, Dysphagia, -Xarelto / Plavix , Breztri / spacer Daily cough productive of some gray sputum.  He says he grew up in a house full of smokers.  We will provide flutter valve. Pending insertion of Watchman device to reduce risk of stroke from A-fib.  HRCT chest- 04/07/22 MPRESSION: 1. Minimal, bland appearing scarring of the bilateral lung bases. No evidence of fibrotic interstitial lung disease. 2. Scarring, bronchiectasis, and volume loss of the medial segment right middle lobe, consistent with sequelae of prior infection or aspiration. 3. Coronary artery disease. 4. Aortic valve calcifications. Correlate for echocardiographic evidence of aortic valve dysfunction. Aortic Atherosclerosis (ICD10-I70.0).  11/10/23- 89 year old male never smoker followed for COPD/bronchitis, complicated by HBP, peripheral edema, GERD, A. Fib/ Xarelto , PE/DVT (2019), CAD/ stent, CHF,  Peripheral Neuropathy, Thrombocytopenia, Dysphagia, -Breztri / spacer, Neb DuoNeb, Trazodone  50,  -----Slight Cough x 5 months Discussed the  use of AI scribe software for clinical note transcription with the patient, who gave verbal consent to proceed.  History of Present Illness   Theodore [name] is an 89 year old male with chronic bronchitis who presents with a persistent cough.  He experiences a persistent cough for the past four to five months, characterized by a nagging sensation with occasional production of white, clearish sputum. There is no worsening of shortness of breath beyond his usual state. The cough feels 'ticklish' and not deep in the chest, with no new or worsening respiratory symptoms.  He uses Breztri , two puffs twice a day, and has a rescue inhaler which he has not been using.SABRA He has not used any cough medicines or antibiotics during this period.  A high-resolution chest CT in February 2024 shows scarring and changes in the middle part of the right lung.     Denies any sense of reflux.  CXR 09/24/22 MPRESSION: 1. No evidence of an acute cardiopulmonary abnormality. 2. Known foci of scarring within the right middle lobe and bilateral lung bases. 3. Aortic Atherosclerosis (ICD10-I70.0).  Assessment and Plan:    Chronic cough Chronic cough for 4-5 months with clear sputum, possibly related to reflux, asymptomatic otherwise. - Order chest X-ray to update imaging since last CT scan in February 2024. - Prescribe benzonatate  for cough management as needed. - Advise rescue inhaler every six hours as needed, especially in situations like sitting in a movie. - Send prescription for benzonatate  to [facility] for a three-month supply.  Chronic bronchitis Chronic bronchitis with previous CT showing scarring in right lung, managed with Breztri . - Continue Breztri  two puffs twice a day. -Update CXR     ROS-see HPI + = positive Constitutional:   No-   weight loss, night sweats, fevers, chills, fatigue, lassitude. HEENT:   No-  headaches, difficulty swallowing, tooth/dental problems, sore throat,       No-   sneezing, itching,  ear ache, nasal congestion, post nasal drip,  CV:  No-   chest pain, orthopnea, PND, swelling in lower extremities, anasarca, dizziness, +palpitations Resp: ++ shortness of breath with exertion or at rest.               productive cough,  +non-productive cough,  No- coughing up of blood.              No-   change in color of mucus.  +wheezing.   Skin: No-   rash or lesions. GI:  No-   heartburn, indigestion, abdominal pain, nausea, vomiting,  GU:  MS:  No-   joint pain or swelling. + back pain Neuro-   + feet burn Psych:  No- change in mood or affect. No depression or anxiety.  No memory loss.  OBJ- Physical Exam General- Alert, Oriented, Affect-appropriate, Distress- none acute, looks well Skin- +ecchymoses on arms  Lymphadenopathy- none Head- atraumatic            Eyes- Gross vision intact, PERRLA, conjunctivae and secretions clear            Ears- Hearing, canals-normal            Nose- Clear, no-Septal dev, mucus, polyps, erosion, perforation             Throat- Mallampati II , mucosa clear , drainage- none, tonsils- atrophic,  Neck- flexible , trachea midline, no stridor , thyroid  nl, carotid no bruit Chest - symmetrical excursion , unlabored           Heart/CV- +IRR/ AFib/almost regular , no murmur , no gallop  , no rub, nl s1 s2                           - JVD- none , edema- none, stasis changes- none, varices- none           Lung-  +clear, wheeze+trace, cough+light, dullness-none, rub- none           Chest wall-  upper back not tender Abd-   Br/ Gen/ Rectal- Not done, not indicated Extrem- cyanosis- none, clubbing, none, atrophy- none, strength- nl Neuro- grossly intact to observation

## 2023-11-10 ENCOUNTER — Ambulatory Visit

## 2023-11-10 ENCOUNTER — Encounter: Payer: Self-pay | Admitting: Internal Medicine

## 2023-11-10 ENCOUNTER — Ambulatory Visit (INDEPENDENT_AMBULATORY_CARE_PROVIDER_SITE_OTHER): Admitting: Internal Medicine

## 2023-11-10 VITALS — BP 114/74 | HR 63 | Temp 98.0°F | Ht 71.0 in | Wt 198.6 lb

## 2023-11-10 DIAGNOSIS — R053 Chronic cough: Secondary | ICD-10-CM

## 2023-11-10 DIAGNOSIS — J42 Unspecified chronic bronchitis: Secondary | ICD-10-CM | POA: Diagnosis not present

## 2023-11-10 MED ORDER — BENZONATATE 200 MG PO CAPS: 200.0000 mg | ORAL_CAPSULE | Freq: Three times a day (TID) | ORAL | 1 refills | Status: AC | PRN

## 2023-11-10 NOTE — Patient Instructions (Signed)
 Script sent to try benzonatate (Tessalon ) perles for cough- 1 every 8 hours if needed  Try using your albuterol  rescue inhaler    2 puffs every 6 hours occasionally if needed for cough  Order - CXR   dx Chronic bronchitis

## 2023-11-11 NOTE — Telephone Encounter (Signed)
 FYI

## 2023-11-12 NOTE — Telephone Encounter (Signed)
Thank you Sir.

## 2023-11-13 ENCOUNTER — Telehealth: Payer: Self-pay

## 2023-11-13 ENCOUNTER — Ambulatory Visit: Payer: Self-pay | Admitting: Internal Medicine

## 2023-11-13 NOTE — Telephone Encounter (Signed)
 Copied from CRM [id number]. Topic: Clinical - Lab/Test Results >> Nov 13, 2023  8:12 AM [name] wrote: Reason for CRM: patient is calling about his chest x ray results. Did relay to patient about the time frame . Please reach out to patient concerning these results . He say it usually does not take this long  [phone]  I called and spoke to pt. I informed pt that we do not have the cxr report back just yet and this will take some time to come back but as soon as this came back, our office will call him back. Pt verbalized understanding. NFN

## 2023-11-19 ENCOUNTER — Ambulatory Visit (HOSPITAL_BASED_OUTPATIENT_CLINIC_OR_DEPARTMENT_OTHER)
Admission: RE | Admit: 2023-11-19 | Discharge: 2023-11-19 | Disposition: A | Discharge status: Home / Self Care | Source: Ambulatory Visit | Attending: Family Medicine | Admitting: Family Medicine

## 2023-11-19 ENCOUNTER — Other Ambulatory Visit: Payer: Medicare Other

## 2023-11-19 DIAGNOSIS — M8580 Other specified disorders of bone density and structure, unspecified site: Secondary | ICD-10-CM | POA: Insufficient documentation

## 2023-11-19 DIAGNOSIS — M8589 Other specified disorders of bone density and structure, multiple sites: Secondary | ICD-10-CM | POA: Insufficient documentation

## 2023-11-19 DIAGNOSIS — Z13820 Encounter for screening for osteoporosis: Secondary | ICD-10-CM | POA: Insufficient documentation

## 2023-11-23 ENCOUNTER — Ambulatory Visit: Payer: Self-pay | Admitting: Family Medicine

## 2023-11-23 DIAGNOSIS — M8580 Other specified disorders of bone density and structure, unspecified site: Secondary | ICD-10-CM

## 2023-11-24 ENCOUNTER — Other Ambulatory Visit (HOSPITAL_BASED_OUTPATIENT_CLINIC_OR_DEPARTMENT_OTHER): Payer: Self-pay | Admitting: Family

## 2023-11-24 ENCOUNTER — Other Ambulatory Visit: Payer: Self-pay | Admitting: Internal Medicine

## 2023-11-24 ENCOUNTER — Other Ambulatory Visit: Payer: Self-pay | Admitting: Family Medicine

## 2023-11-24 DIAGNOSIS — G4700 Insomnia, unspecified: Secondary | ICD-10-CM

## 2023-12-01 ENCOUNTER — Telehealth: Payer: Self-pay | Admitting: Family Medicine

## 2023-12-01 NOTE — Telephone Encounter (Signed)
Placed in your box for review.  °

## 2023-12-01 NOTE — Telephone Encounter (Signed)
Form filled and in CMA box.  

## 2023-12-01 NOTE — Telephone Encounter (Signed)
 Type of forms received: handicap placcap   Routed to: gut. pool   Paperwork received by : randall      Individual made aware of 3-5 business day turn around (Y/N): y   Form completed and patient made aware of charges(Y/N): y      Form location:  provider's folder

## 2023-12-02 NOTE — Telephone Encounter (Signed)
 Informed patient his Handicap placard form is ready for pick up. Patient will pick up on 12/03/23.

## 2023-12-29 ENCOUNTER — Other Ambulatory Visit: Payer: Self-pay | Admitting: Family Medicine

## 2023-12-29 ENCOUNTER — Ambulatory Visit (INDEPENDENT_AMBULATORY_CARE_PROVIDER_SITE_OTHER): Admitting: Cardiovascular Disease

## 2023-12-29 ENCOUNTER — Encounter (HOSPITAL_BASED_OUTPATIENT_CLINIC_OR_DEPARTMENT_OTHER): Payer: Self-pay | Admitting: Cardiovascular Disease

## 2023-12-29 VITALS — BP 110/64 | HR 67 | Ht 70.5 in | Wt 198.9 lb

## 2023-12-29 DIAGNOSIS — Z9861 Coronary angioplasty status: Secondary | ICD-10-CM

## 2023-12-29 DIAGNOSIS — G4700 Insomnia, unspecified: Secondary | ICD-10-CM

## 2023-12-29 DIAGNOSIS — E7800 Pure hypercholesterolemia, unspecified: Secondary | ICD-10-CM

## 2023-12-29 DIAGNOSIS — I4510 Unspecified right bundle-branch block: Secondary | ICD-10-CM

## 2023-12-29 DIAGNOSIS — I493 Ventricular premature depolarization: Secondary | ICD-10-CM | POA: Diagnosis not present

## 2023-12-29 DIAGNOSIS — I484 Atypical atrial flutter: Secondary | ICD-10-CM

## 2023-12-29 DIAGNOSIS — I428 Other cardiomyopathies: Secondary | ICD-10-CM

## 2023-12-29 DIAGNOSIS — I5042 Chronic combined systolic (congestive) and diastolic (congestive) heart failure: Secondary | ICD-10-CM

## 2023-12-29 DIAGNOSIS — I480 Paroxysmal atrial fibrillation: Secondary | ICD-10-CM

## 2023-12-29 DIAGNOSIS — I10 Essential (primary) hypertension: Secondary | ICD-10-CM | POA: Diagnosis not present

## 2023-12-29 DIAGNOSIS — I2510 Atherosclerotic heart disease of native coronary artery without angina pectoris: Secondary | ICD-10-CM

## 2023-12-29 DIAGNOSIS — J449 Chronic obstructive pulmonary disease, unspecified: Secondary | ICD-10-CM

## 2023-12-29 NOTE — Progress Notes (Signed)
 Cardiology Office Note:  .    Date:  12/29/2023  ID:  Theodore [name], DOB 1934-08-17, MRN [id number] PCP: Rilla Baller, MD  [facility] Providers Cardiologist:  Annabella Scarce, MD Electrophysiologist:  Will Gladis Norton, MD     History of Present Illness: Theodore [name]   Theodore [name] is a 89 y.o. male with CAD s/p LAD PCI, HFpEF, hypertension, hyperlipidemia, persistent atrial fibrillation s/p ablation, DVT/PE, peripheral neuropathy, asthma and Rheumatic fever who presents for follow up.  Theodore [name] was previously a patient of Dr. Dominick.  Theodore [name] was diagnosed with atrial fibrillation in 2014.  He was initially on Pradaxa  and switched to Xarelto .  He underwent ablation on 12/19/15 with Dr. Norton and has remained in sinus rhythm.  He had an episode of chest pain 08/2014 and had a Lexiscan  Myoview  08/31/14 that showed an old inferior scar with peri-infarct ischemia and LVEF 49%.  He then had a LHC that revealed 70% ostial LAD, 50% prox LAD, 30% OM1 and 35% RCA.  He was managed medically.  He continued to report exertional symptoms despite medical management. He had a LHC with Dr. Anner on 09/28/15 that again revealed a 70% ostial-proximal LAD lesion.  Dr. Anner performed FFR on that lesion and there was no significant change.  The left ventriculogram performed at that time revealed LVEF 35-45%.  This subsequently improved to 50% on 03/2018.  He again underwent left heart cath on 01/10/2019 and there was progression of his LAD lesion.  He underwent atherectomy and PCI with drug-eluting stent placement 01/11/2019.  Since then he had noted an improvement in his breathing.  Atorvastatin  was increased to 80 mg but he noted leg pain.  He was seen in the lipid clinic and started back on atorvastatin  40mg  and zetia  10 mg.     Theodore [name] underwent L knee replacement on  02/09/17.  Xarelto  was held for 2 days prior to surgery and resumed post-operatively.  He developed bleeding at the incision site so  Xarelto  was again held and he was switched to aspirin .  He subsequently developed a left lower extremity DVT and pulmonary embolism.  Xarelto  was resumed. He had been struggling with peripheral neuropathy.  It started a couple weeks after the cath and he thought it was related to Plavix .  He saw Dr. Norton and switched from clopidogrel  and and Xarelto  to ticagrelor  and Eliquis .  His neuropathy did not improve.  He followed up with his PCP and was started on gabapentin  and nortriptyline . Theodore [name] complained of recurrent atrial fibrillation.  He wore a 14-day event monitor 07/2019 that showed some PACs, PVCs, and ventricular bigeminy but no atrial fibrillation.    Theodore [name] continued to suffer from symmetric burning sensations in his feet as well as shortness of breath with minimal exertion.  His LE edema was improving on 40 mg Lasix . He had reported one episode of internal bleeding, so his blood thinner was discontinued and 81 mg aspirin  was started. He followed up with his neurologist Dr. Maree, who recommended an implant procedure in his lower back. Prior steroidal injections for nerve impingement were not helping. Given his persistent shortness of breath, he had an Echo 09/2020 that showed his LVEF had reduced to 45-50% with global hypokinesis.  He had grade 2 diastolic dysfunction.  He was started on Entresto  and Farxiga .  Since that time he has been feeling much better.  He had a repeat echo 12/2020 with LVEF 55-60% and indeterminate diastolic function.  Right atrial pressure was 3 mmHG. He saw Dr. Inocencio 10/2020 and decided to hold his statin for 30 days to see if this would help his neuropathy.    He noticed improvement on Entresto  and Farxiga . He saw pharmacist 01/2021 and was started on PCSK9 inhibitor.  He saw Jodie Passey, PA-C on 05/2021 with symptoms of atrial fibrillation.  He was in A-fib for about 10 days and rates were well controlled.  He was restarted on Xarelto  and plans were made for follow-up  and possible cardioversion or amiodarone .  He saw Charlies Arthur, PA-C on 06/2021 and was back in sinus rhythm.  He reported low blood pressures at home after his beta-blocker was increased.  He saw Dr. Inocencio 10/2021 and decided to stay on Xarelto .    At his visit 12/2021 weeks back in atrial flutter.  He underwent successful cardioversion 12/2021. He subsequently had bradycardia.  Amiodarone  was reduced due to tremors.  Echo 05/2022 revealed LVEF 30-35% with low normal RV function.  He saw Reche Finder, NP 05/2022 and Entresto  was reduced to half tablet twice daily.  He had frequent nosebleeds and was referred to Dr. Cindie for [facility] implant which occurred on 09/2022.  Metoprolol  was switched to propranolol  due to tremor.  However due to his bradycardia we will switch back to a lower dose of metoprolol .  Spironolactone  was discontinued due to hypotension.  Previously stopped Repatha  due to concern for his neurologic symptoms but notes that it did not improve after stopping it.  He followed up with Kate Minus, NP 03/2023 and reported lower extremity pain.  He was referred to vein and vascular.    At his visit 04/2023 he noted that he was still experiencing shortness of breath that he attributed to his lung disease.  Reduction in metoprolol  and Entresto  helped his energy.  He saw Macario Finder, NP 08/2023 and was doing well.   Discussed the use of AI scribe software for clinical note transcription with the patient, who gave verbal consent to proceed.  History of Present Illness Theodore [name] experiences episodes of irregular heartbeat that occur intermittently, described as a 'rumble' rather than a rhythmic beat. These episodes last for about 10 to 15 minutes and occur without any specific pattern, sometimes while he is relaxing. No associated lightheadedness or dizziness during these episodes.  He cannot tolerate playing golf in the summer due to heat-induced shortness of breath. He is able to play in the spring  and fall, but excessive walking leads to fatigue and shortness of breath.  He is currently taking atorvastatin , Farxiga , aspirin , Zetia , and a calcium  supplement that includes vitamin D . He stopped taking a separate vitamin D  supplement after starting the calcium  supplement. He mentions a past incident where x-rays indicated bone weakness, leading to the addition of the calcium  supplement.  His daughter has moved in with him and his wife to assist with caregiving, as his wife has mobility issues following a fall and a procedure for fluid in her brain.   ROS:  As per HPI  Studies Reviewed: .       Cardiac PET 03/2023:     LV perfusion is normal.   Rest left ventricular function is abnormal. Rest global function is moderately reduced. Rest EF: 39%. Stress left ventricular function is abnormal. Stress global function is moderately reduced. Stress EF: 39%. End diastolic cavity size is normal.   Myocardial blood flow reserve is not reported in this patient due to technical or patient-specific concerns that  affect accuracy (systolic dysfunction).   Coronary calcium  assessment not performed due to prior revascularization.  Incidental note is made of trivial pericardial effusion versus pericardial thickening seen on the attenuation correction CT.   Findings are consistent with no ischemia and no infarction. The study is intermediate risk due to reduced LVEF.  Echo 05/2022:    1. Left ventricular ejection fraction, by estimation, is 30 to 35%. Left  ventricular ejection fraction by 3D volume is 35 %. The left ventricle has  moderately decreased function. The left ventricle demonstrates global  hypokinesis. The left ventricular  internal cavity size was mildly dilated. There is mild left ventricular  hypertrophy. Left ventricular diastolic parameters are indeterminate.   2. Right ventricular systolic function is low normal. The right  ventricular size is mildly enlarged.   3. Left atrial size was  mildly dilated.   4. Right atrial size was moderately dilated.   5. The mitral valve is abnormal. Trivial mitral valve regurgitation.   6. The aortic valve is tricuspid. Aortic valve regurgitation is mild.  Aortic valve sclerosis/calcification is present, without any evidence of  aortic stenosis. Aortic valve mean gradient measures 8.0 mmHg.   7. The inferior vena cava is normal in size with greater than 50%  respiratory variability, suggesting right atrial pressure of 3 mmHg.   8. Rhythm strip during this exam demonstrates frequent PVC's.  Risk Assessment/Calculations:    CHA2DS2-VASc Score = 5   This indicates a 7.2% annual risk of stroke. The patient's score is based upon: CHF History: 1 HTN History: 1 Diabetes History: 0 Stroke History: 0 Vascular Disease History: 1 Age Score: 2 Gender Score: 0            Physical Exam:   VS:  BP 110/64 (BP Location: Right Arm, Patient Position: Sitting, Cuff Size: Normal)   Pulse 67   Ht 5' 10.5 (1.791 m)   Wt 198 lb 14.4 oz (90.2 kg)   SpO2 94%   BMI 28.14 kg/m  , BMI Body mass index is 28.14 kg/m. GENERAL:  Well appearing HEENT: Pupils equal round and reactive, fundi not visualized, oral mucosa unremarkable NECK:  No jugular venous distention, waveform within normal limits, carotid upstroke brisk and symmetric, no bruits, no thyromegaly LUNGS:  Clear to auscultation bilaterally HEART:  RRR.  PMI not displaced or sustained,S1 and S2 within normal limits, no S3, no S4, no clicks, no rubs, no murmurs ABD:  Flat, positive bowel sounds normal in frequency in pitch, no bruits, no rebound, no guarding, no midline pulsatile mass, no hepatomegaly, no splenomegaly EXT:  2 plus pulses throughout, no edema, no cyanosis no clubbing SKIN:  No rashes no nodules NEURO:  Cranial nerves II through XII grossly intact, motor grossly intact throughout PSYCH:  Cognitively intact, oriented to person place and time   ASSESSMENT AND PLAN: .      Assessment & Plan # Paroxysmal atrial fibrillation and atypical atrial flutter S/p ablation and multiple cardioversions.  He has a It trainer in place.  He notes intermittent episodes of irregular heartbeat lasting 10-15 minutes, not associated with lightheadedness or dizziness. No current intervention required. - Monitor for changes in frequency or duration of episodes. - Continue aspirin   # Chronic combined systolic and diastolic heart failure LVEF 39% on cardiac PET 03/2023.  Nuclear stress negative 02/2022 at the time of reduced LV function.  No symptoms of heart failure exacerbation. Blood pressure well-controlled at 110/64 mmHg. - Continue current heart failure management regimen. -  Continue metoprolol  and Entresto . No BP room for MRA - Continue Farxiga  - Continue lasix   # Atherosclerotic heart disease of native coronary artery without angina No angina symptoms. Cholesterol levels well-controlled with atorvastatin , LDL at 55 mg/dL. - Continue atorvastatin  and Zetia  therapy. - Continue aspirin   # Chronic obstructive pulmonary disease Shortness of breath with exertion, particularly in hot weather. Able to play golf in cooler weather without significant issues. - Continue current management and monitor for changes in symptoms.  # Pure hypercholesterolemia Cholesterol levels well-controlled. LDL at 55 mg/dL. - Continue current lipid-lowering therapy.         Dispo: f/u with Caitlin in 6 months.  1 year with me.   Signed, Annabella Scarce, MD

## 2023-12-29 NOTE — Patient Instructions (Signed)
 Medication Instructions:   Your physician recommends that you continue on your current medications as directed. Please refer to the Current Medication list given to you today.   *If you need a refill on your cardiac medications before your next appointment, please call your pharmacy*  Lab Work:  None ordered.  If you have labs (blood work) drawn today and your tests are completely normal, you will receive your results only by: MyChart Message (if you have MyChart) OR A paper copy in the mail If you have any lab test that is abnormal or we need to change your treatment, we will call you to review the results.  Testing/Procedures:  None ordered.  Follow-Up: At [facility], you and your health needs are our priority.  As part of our continuing mission to provide you with exceptional heart care, our providers are all part of one team.  This team includes your primary Cardiologist (physician) and Advanced Practice Providers or APPs (Physician Assistants and Nurse Practitioners) who all work together to provide you with the care you need, when you need it.  Your next appointment:   6 month(s)  Provider:   Reche Finder, NP    We recommend signing up for the patient portal called MyChart.  Sign up information is provided on this After Visit Summary.  MyChart is used to connect with patients for Virtual Visits (Telemedicine).  Patients are able to view lab/test results, encounter notes, upcoming appointments, etc.  Non-urgent messages can be sent to your provider as well.   To learn more about what you can do with MyChart, go to [email or web address].   Other Instructions  Your physician wants you to follow-up in: 6 months.  You will receive a reminder letter in the mail two months in advance. If you don't receive a letter, please call our office to schedule the follow-up appointment.

## 2023-12-30 NOTE — Progress Notes (Unsigned)
 Chief Complaint: Dysphagia  HPI:    Theodore [name] is an 89 year old Caucasian male, known to Dr. Legrand, with a past medical history as listed below including a flutter, CHF, GERD, emphysema, A-fib on aspirin  and multiple others, who was referred to me by Rilla Baller, MD for a complaint of dysphagia.      12/02/2018 EGD and colonoscopy.  Colonoscopy with diverticulosis in the sigmoid descending colon, 4 polyps throughout the colon.  Pathology showed tubular adenomas and hyperplastic polyp.  EGD with medium size hiatal hernia and otherwise normal.    10/17/2021 patient seen in clinic by Dr. Legrand for dysphagia.  At that time discussed cricopharyngeal dysfunction.  Discussed that is not amenable to endoscopic dilation.  Recommended ENT referral.    09/24/22 echo with LVEF 35%.  Status post Watchman procedure.    10/07/2023 laryngoscopy with normal pharynx and larynx.  At that time office visit with ENT.  Recommended referral to us  to discuss possible dilation of the muscle, Botox injection or surgical division of the muscle.  Recommended we do a manometry.  He was continued on twice daily PPI.  Previous GI imaging: EXAM: ESOPHOGRAM / BARIUM SWALLOW / BARIUM TABLET STUDY   TECHNIQUE: Combined double contrast and single contrast examination performed using effervescent crystals, thick barium liquid, and thin barium liquid. The patient was observed with fluoroscopy swallowing a 13 mm barium sulphate tablet.   FLUOROSCOPY: Radiation Exposure Index (as provided by the fluoroscopic device): 12.7 mGy Kerma   COMPARISON:  Chest radiographs 12/20/2020.  Chest CT 03/03/2017.   FINDINGS: The patient swallowed the barium without difficulty. Rapid sequence imaging of the pharynx in the AP and lateral projections demonstrates no laryngeal penetration or mucosal abnormalities. There is a prominent cricopharyngeal impression on the posterior esophagus without ulceration. Prior two level cervical fusion  noted.   The esophageal motility is normal. There is no evidence of esophageal mass, stricture or ulceration. There is a small reducible hiatal hernia, only visualized with the water  siphon test. Mild gastroesophageal reflux was elicited with the water  siphon test.   At the conclusion of the study, a 13 mm barium tablet was administered. This passed without delay into the stomach.   IMPRESSION: 1. Mild gastroesophageal reflux with associated prominent cricopharyngeal impression on the esophagus. 2. No evidence of stricture, mass or ulceration. Barium tablet passed without delay into the stomach.     Electronically Signed   By: Elsie Perone M.D.   On: 08/27/2021 08:37 Past Medical History:  Diagnosis Date   Arthritis Years   Atypical atrial flutter (HCC) 12/31/2021   Blood transfusion without reported diagnosis Years ago   CAP (community acquired pneumonia) 01/19/2018   Cataract 1992   CHF (congestive heart failure) (HCC) Years ago   Emphysema of lung (HCC) 2014   GERD (gastroesophageal reflux disease) Years   Heart murmur Years ago   History of arthritis    History of rheumatic fever    Hypercholesterolemia    Hypertension Years ago   Nocturia    PAF (paroxysmal atrial fibrillation) (HCC) 02/2011   Placed on Pradaxa . Did not require cardioversion; spontaneously converted   Peripheral edema    Persistent atrial fibrillation (HCC) 01/21/2011   S/p ablation 12/2015 NSR up until his PCI Dec 2020 when he went into AF post PCI   Presence of Watchman left atrial appendage closure device 09/24/2022   27mm Watchman FLX Pro placed by Dr. 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   at least in situ   Tendonitis of elbow, left     Past Surgical History:  Procedure Laterality Date   ATRIAL FIBRILLATION ABLATION  12/19/2015   BACK SURGERY  12/24/2009   fusion C3-C4   BACK SURGERY  2010   fusion L4-L5  Darrick)   CARDIAC CATHETERIZATION  2009   NONOBSTRUCTIVE ATHERSCLEROTIC CORONARY DISEASE AND NORMAL  LV FUNCTION   CARDIAC CATHETERIZATION N/A 09/08/2014   Procedure: Left Heart Cath and Coronary Angiography;  Surgeon: Victory LELON Sharps, MD;  Location: [facility] INVASIVE CV LAB;  Service: Cardiovascular;  Laterality: N/A;   CARDIAC CATHETERIZATION N/A 09/28/2015   Procedure: Left Heart Cath and Coronary Angiography;  Surgeon: Alm LELON Clay, MD;  Location: [facility] INVASIVE CV LAB;  Service: Cardiovascular;  Laterality: N/A;   CARDIAC CATHETERIZATION N/A 09/28/2015   Procedure: Intravascular Pressure Wire/FFR Study;  Surgeon: Alm LELON Clay, MD;  Location: [facility] INVASIVE CV LAB;  Service: Cardiovascular;  Laterality: N/A;   CARDIOVERSION N/A 10/23/2015   Procedure: CARDIOVERSION;  Surgeon: Redell GORMAN Shallow, MD;  Location: [facility] ENDOSCOPY;  Service: Cardiovascular;  Laterality: N/A;   CARDIOVERSION N/A 01/01/2022   Procedure: CARDIOVERSION;  Surgeon: Hobart Powell BRAVO, MD;  Location: [facility] ENDOSCOPY;  Service: Cardiovascular;  Laterality: N/A;   COLONOSCOPY  2013   per patient, rpt 5 yrs   COLONOSCOPY WITH PROPOFOL  N/A 12/02/2018   TAx2, HP, diverticulosis Robertha Agent, MD)   CORONARY ATHERECTOMY N/A 01/11/2019   Procedure: CORONARY ATHERECTOMY;  Surgeon: Verlin Lonni BIRCH, MD;  Location: [facility] INVASIVE CV LAB;  Service: Cardiovascular;  Laterality: N/A;   CORONARY PRESSURE/FFR STUDY N/A 01/10/2019   Procedure: INTRAVASCULAR PRESSURE WIRE/FFR STUDY;  Surgeon: Clay Alm LELON, MD;  Location: [facility] INVASIVE CV LAB;  Service: Cardiovascular;  Laterality: N/A;   CORONARY STENT INTERVENTION N/A 01/11/2019   Procedure: CORONARY STENT INTERVENTION;  Surgeon: Verlin Lonni BIRCH, MD;  Location: [facility] INVASIVE CV LAB;  Service: Cardiovascular;  Laterality: N/A;   ELECTROPHYSIOLOGIC STUDY N/A 12/19/2015   Procedure: Atrial Fibrillation Ablation;  Surgeon: Will Gladis Norton, MD;  Location: [facility] INVASIVE CV LAB;  Service:  Cardiovascular;  Laterality: N/A;   ESOPHAGOGASTRODUODENOSCOPY (EGD) WITH PROPOFOL  N/A 12/02/2018   medium HH Robertha Agent, MD)   EYE SURGERY  1992   JOINT REPLACEMENT  2006, 2019   KNEE ARTHROSCOPY Left 03/2016   Dr. Ernie   LEFT ATRIAL APPENDAGE OCCLUSION N/A 09/24/2022   Procedure: LEFT ATRIAL APPENDAGE OCCLUSION;  Surgeon: Cindie Ole DASEN, MD;  Location: [facility] INVASIVE CV LAB;  Service: Cardiovascular;  Laterality: N/A;   LEFT HEART CATH AND CORONARY ANGIOGRAPHY N/A 01/10/2019   Procedure: LEFT HEART CATH AND CORONARY ANGIOGRAPHY;  Surgeon: Clay Alm LELON, MD;  Location: [facility] INVASIVE CV LAB;  Service: Cardiovascular;  Laterality: N/A;   PATELLAR TENDON REPAIR Left 2008   POLYPECTOMY  12/02/2018   Procedure: POLYPECTOMY;  Surgeon: Celestia Agent, MD;  Location: [facility] ENDOSCOPY;  Service: Endoscopy;;   SPINE SURGERY  2 in early 2000's   TEE WITHOUT CARDIOVERSION N/A 09/24/2022   Procedure: TRANSESOPHAGEAL ECHOCARDIOGRAM;  Surgeon: Cindie Ole DASEN, MD;  Location: [facility] INVASIVE CV LAB;  Service: Cardiovascular;  Laterality: N/A;   TOTAL KNEE ARTHROPLASTY Right 2006   TOTAL KNEE ARTHROPLASTY Left 02/09/2017   Procedure: LEFT TOTAL KNEE ARTHROPLASTY, EXCISION LEFT DISTAL THIGH MASS;  Surgeon: Ernie Cough, MD;  Location: [facility] ORS;  Service: Orthopedics;  Laterality: Left;  90 mins   TRICEPS TENDON  REPAIR Left 2013    Current Outpatient Medications  Medication Sig Dispense Refill   acetaminophen  (TYLENOL ) 500 MG tablet Take 1 tablet (500 mg total) by mouth 3 (three) times daily as needed. 30 tablet 0   aspirin  EC 81 MG tablet Take 1 tablet (81 mg total) by mouth daily. Swallow whole.     atorvastatin  (LIPITOR) 40 MG tablet Take 1 tablet (40 mg total) by mouth daily. 90 tablet 3   benzonatate  (TESSALON ) 200 MG capsule Take 1 capsule (200 mg total) by mouth 3 (three) times daily as needed for cough. 30 capsule 1   BREZTRI  AEROSPHERE 160-9-4.8 MCG/ACT AERO inhaler USE 2 INHALATIONS BY MOUTH  TWICE DAILY 32.1 g 3   cyanocobalamin  (VITAMIN B12) 500 MCG tablet Take 1 tablet (500 mcg total) by mouth every Monday, Wednesday, and Friday.     dapagliflozin  propanediol (FARXIGA ) 10 MG TABS tablet Take 1 tablet (10 mg total) by mouth daily before breakfast. 90 tablet 3   ezetimibe  (ZETIA ) 10 MG tablet Take 1 tablet (10 mg total) by mouth daily. 90 tablet 3   furosemide  (LASIX ) 20 MG tablet Take 1 tablet (20 mg total) by mouth daily. 90 tablet 3   gabapentin  (NEURONTIN ) 300 MG capsule Take 900 mg by mouth at bedtime.     ipratropium-albuterol  (DUONEB) 0.5-2.5 (3) MG/3ML SOLN 1 neb every 6 hours as needed 360 mL 12   metoprolol  succinate (TOPROL -XL) 50 MG 24 hr tablet Take 1 tablet (50 mg total) by mouth daily. Take with or immediately following a meal. 90 tablet 3   OVER THE COUNTER MEDICATION Take 1 tablet by mouth in the morning and at bedtime. CALCIUM - 600 mg with D3 400 mg     pantoprazole  (PROTONIX ) 40 MG tablet Take 1 tablet (40 mg total) by mouth daily. 90 tablet 4   potassium chloride  (KLOR-CON  M) 10 MEQ tablet Take 1 tablet (10 mEq total) by mouth daily.     sacubitril -valsartan  (ENTRESTO ) 24-26 MG Take 1 tablet by mouth 2 (two) times daily. 180 tablet 3   traZODone  (DESYREL ) 50 MG tablet TAKE 1/2 TO 1 TABLET BY MOUTH AT BEDTIME AS NEEDED FOR SLEEP 90 tablet 0   vitamin C  (ASCORBIC ACID ) 500 MG tablet Take 500 mg by mouth daily.     No current facility-administered medications for this visit.    Allergies as of 12/31/2023 - Review Complete 12/29/2023  Allergen Reaction Noted   Penicillins Anaphylaxis, Rash, and Other (See Comments) 12/14/2012   Amlodipine  Swelling and Other (See Comments) 12/21/2014   Effexor  [venlafaxine ] Other (See Comments) 07/19/2019   Hydrocodone -acetaminophen  Nausea And Vomiting and Other (See Comments) 03/03/2017   Lisinopril Cough 09/26/2015   Mevacor [lovastatin] Other (See Comments) 03/03/2017   Zocor [simvastatin] Other (See Comments) 06/03/2010    Tamiflu [oseltamivir phosphate] Itching and Rash 06/10/2010    Family History  Problem Relation Age of Onset   Congestive Heart Failure Father 22   COPD Father    Hypertension Father    Heart failure Father    Asthma Sister    Heart disease Sister    Heart disease Sister        CHF   Heart disease Brother        CHF   COPD Brother    Stroke Neg Hx    Colon cancer Neg Hx    Stomach cancer Neg Hx    CAD Neg Hx        MI    Social History  Socioeconomic History   Marital status: Married    Spouse name: Not on file   Number of children: Not on file   Years of education: Not on file   Highest education level: Master's degree (e.g., MA, MS, MEng, MEd, MSW, MBA)  Occupational History   Occupation: retired    Associate Professor: RETIRED    Comment: acupuncturist  Tobacco Use   Smoking status: Never    Passive exposure: Past (first 20 years of his life)   Smokeless tobacco: Never   Tobacco comments:    Never smoke 01/13/22  Vaping Use   Vaping status: Never Used  Substance and Sexual Activity   Alcohol use: Yes    Alcohol/week: 10.0 standard drinks of alcohol    Types: 10 Cans of beer per week    Comment: 2 beers daily 01/13/22   Drug use: No   Sexual activity: Not Currently  Other Topics Concern   Not on file  Social History Narrative   Lives with wife, no pets   Retired   Research Scientist (medical): acupuncturist   Edu: master's   Activity: golf   Diet: good water , fruits/vegetables daily   Social Drivers of Corporate Investment Banker Strain: Low Risk  (08/28/2023)   Overall Financial Resource Strain (CARDIA)    Difficulty of Paying Living Expenses: Not hard at all  Food Insecurity: No Food Insecurity (12/29/2023)   Hunger Vital Sign    Worried About Running Out of Food in the Last Year: Never true    Ran Out of Food in the Last Year: Never true  Transportation Needs: No Transportation Needs (08/28/2023)   PRAPARE - Administrator, Civil Service (Medical): No     Lack of Transportation (Non-Medical): No  Physical Activity: Inactive (08/28/2023)   Exercise Vital Sign    Days of Exercise per Week: 1 day    Minutes of Exercise per Session: 0 min  Stress: No Stress Concern Present (08/28/2023)   [employer] of Occupational Health - Occupational Stress Questionnaire    Feeling of Stress: Not at all  Social Connections: Socially Integrated (08/28/2023)   Social Connection and Isolation Panel    Frequency of Communication with Friends and Family: More than three times a week    Frequency of Social Gatherings with Friends and Family: Twice a week    Attends Religious Services: 1 to 4 times per year    Active Member of [employer] or Organizations: Yes    Attends Banker Meetings: Never    Marital Status: Married  Catering Manager Violence: Not At Risk (01/21/2023)   Humiliation, Afraid, Rape, and Kick questionnaire    Fear of Current or Ex-Partner: No    Emotionally Abused: No    Physically Abused: No    Sexually Abused: No    Review of Systems:    Constitutional: No weight loss, fever, chills, weakness or fatigue HEENT: Eyes: No change in vision               Ears, Nose, Throat:  No change in hearing or congestion Skin: No rash or itching Cardiovascular: No chest pain, chest pressure or palpitations   Respiratory: No SOB or cough Gastrointestinal: See HPI and otherwise negative Genitourinary: No dysuria or change in urinary frequency Neurological: No headache, dizziness or syncope Musculoskeletal: No new muscle or joint pain Hematologic: No bleeding or bruising Psychiatric: No history of depression or anxiety    Physical Exam:  Vital signs: There were no vitals  taken for this visit.  Constitutional:   Pleasant Caucasian male appears to be in NAD, Well developed, Well nourished, alert and cooperative Head:  Normocephalic and atraumatic. Eyes:   PEERL, EOMI. No icterus. Conjunctiva pink. Ears:  Normal auditory acuity. Neck:   Supple Throat: Oral cavity and pharynx without inflammation, swelling or lesion.  Respiratory: Respirations even and unlabored. Lungs clear to auscultation bilaterally.   No wheezes, crackles, or rhonchi.  Cardiovascular: Normal S1, S2. No MRG. Regular rate and rhythm. No peripheral edema, cyanosis or pallor.  Gastrointestinal:  Soft, nondistended, nontender. No rebound or guarding. Normal bowel sounds. No appreciable masses or hepatomegaly. Rectal:  Not performed.  Msk:  Symmetrical without gross deformities. Without edema, no deformity or joint abnormality.  Neurologic:  Alert and  oriented x4;  grossly normal neurologically.  Skin:   Dry and intact without significant lesions or rashes. Psychiatric: Oriented to person, place and time. Demonstrates good judgement and reason without abnormal affect or behaviors.  RELEVANT LABS AND IMAGING: CBC    Component Value Date/Time   WBC 6.1 02/23/2023 0720   RBC 4.96 02/23/2023 0720   HGB 17.1 (H) 02/23/2023 0720   HGB 17.6 10/24/2022 1111   HGB 14.5 04/08/2013 0834   HCT 51.0 02/23/2023 0720   HCT 48.1 10/24/2022 1111   HCT 40.9 04/08/2013 0834   PLT 145.0 (L) 02/23/2023 0720   PLT 150 10/24/2022 1111   MCV 102.8 (H) 02/23/2023 0720   MCV 102 (H) 10/24/2022 1111   MCV 94.9 04/08/2013 0834   MCH 37.3 (H) 10/24/2022 1111   MCH 34.7 (H) 11/14/2021 0942   MCHC 33.6 02/23/2023 0720   RDW 13.2 02/23/2023 0720   RDW 12.3 10/24/2022 1111   RDW 12.7 04/08/2013 0834   LYMPHSABS 2.1 02/23/2023 0720   LYMPHSABS 2.3 12/31/2021 0957   LYMPHSABS 2.5 04/08/2013 0834   MONOABS 0.4 02/23/2023 0720   MONOABS 0.7 04/08/2013 0834   EOSABS 0.1 02/23/2023 0720   EOSABS 0.1 12/31/2021 0957   BASOSABS 0.0 02/23/2023 0720   BASOSABS 0.0 12/31/2021 0957   BASOSABS 0.0 04/08/2013 0834    CMP     Component Value Date/Time   NA 141 09/01/2023 0832   K 4.3 09/01/2023 0832   CL 102 09/01/2023 0832   CO2 21 09/01/2023 0832   GLUCOSE 101 (H) 09/01/2023  0832   GLUCOSE 91 02/23/2023 0720   BUN 18 09/01/2023 0832   CREATININE 0.96 09/01/2023 0832   CREATININE 1.01 12/05/2015 0944   CALCIUM  9.2 09/01/2023 0832   PROT 6.0 02/23/2023 0720   PROT 6.6 09/04/2022 0810   ALBUMIN 4.1 02/23/2023 0720   ALBUMIN 4.5 09/04/2022 0810   AST 18 02/23/2023 0720   ALT 17 02/23/2023 0720   ALKPHOS 67 02/23/2023 0720   BILITOT 0.8 02/23/2023 0720   BILITOT 1.0 09/04/2022 0810   GFRNONAA 56 (L) 09/25/2022 0641   GFRNONAA 59 (L) 11/07/2015 1117   GFRAA >60 01/12/2019 0358   GFRAA 68 11/07/2015 1117    Assessment: 1. ***  Plan: 1. ***     Theodore Failing, PA-C [facility] 12/30/2023, 10:09 AM  Cc: Rilla Baller, MD

## 2023-12-30 NOTE — Telephone Encounter (Signed)
 Name of Medication : Trazodone  #90 Name of Pharmacy: [facility] Last fill or written date:11/24/23 Last office visit and type: 08/28/23 Next office visit and type 01/22/24 Last controlled substance agreement date: Last UDS date:

## 2023-12-31 ENCOUNTER — Ambulatory Visit (INDEPENDENT_AMBULATORY_CARE_PROVIDER_SITE_OTHER): Admitting: Physician Assistant

## 2023-12-31 ENCOUNTER — Encounter: Payer: Self-pay | Admitting: Physician Assistant

## 2023-12-31 VITALS — BP 122/70 | HR 50 | Ht 70.5 in | Wt 196.0 lb

## 2023-12-31 DIAGNOSIS — R1319 Other dysphagia: Secondary | ICD-10-CM | POA: Diagnosis not present

## 2023-12-31 NOTE — Telephone Encounter (Signed)
 ERx

## 2023-12-31 NOTE — Patient Instructions (Addendum)
 You have been scheduled for a Barium Esophogram at [facility] Radiology (1st floor of the hospital) on 01/04/2024 at 10:30 am. Please arrive 30 minutes prior to your appointment for registration. Make certain not to have anything to eat or drink 3 hours prior to your test. If you need to reschedule for any reason, please contact radiology at [phone] to do so. __________________________________________________________________ A barium swallow is an examination that concentrates on views of the esophagus. This tends to be a double contrast exam (barium and two liquids which, when combined, create a gas to distend the wall of the oesophagus) or single contrast (non-ionic iodine based). The study is usually tailored to your symptoms so a good history is essential. Attention is paid during the study to the form, structure and configuration of the esophagus, looking for functional disorders (such as aspiration, dysphagia, achalasia, motility and reflux) EXAMINATION You may be asked to change into a gown, depending on the type of swallow being performed. A radiologist and radiographer will perform the procedure. The radiologist will advise you of the type of contrast selected for your procedure and direct you during the exam. You will be asked to stand, sit or lie in several different positions and to hold a small amount of fluid in your mouth before being asked to swallow while the imaging is performed .In some instances you may be asked to swallow barium coated marshmallows to assess the motility of a solid food bolus. The exam can be recorded as a digital or video fluoroscopy procedure. POST PROCEDURE It will take 1-2 days for the barium to pass through your system. To facilitate this, it is important, unless otherwise directed, to increase your fluids for the next 24-48hrs and to resume your normal diet.  This test typically takes about 30 minutes to  per  _______________________________________________________  If your blood pressure at your visit was 140/90 or greater, please contact your primary care physician to follow up on this.  _______________________________________________________  If you are age 40 or older, your body mass index should be between 23-30. Your Body mass index is 27.73 kg/m. If this is out of the aforementioned range listed, please consider follow up with your Primary Care Provider.  If you are age 80 or younger, your body mass index should be between 19-25. Your Body mass index is 27.73 kg/m. If this is out of the aformentioned range listed, please consider follow up with your Primary Care Provider.   ________________________________________________________  The [facility] providers would like to encourage you to use MYCHART to communicate with providers for non-urgent requests or questions.  Due to long hold times on the telephone, sending your provider a message by [facility] may be a faster and more efficient way to get a response.  Please allow 48 business hours for a response.  Please remember that this is for non-urgent requests.  _______________________________________________________  [facility] is using a team-based approach to care.  Your team is made up of your doctor and two to three APPS. Our APPS (Nurse Practitioners and Physician Assistants) work with your physician to ensure care continuity for you. They are fully qualified to address your health concerns and develop a treatment plan. They communicate directly with your gastroenterologist to care for you. Seeing the Advanced Practice Practitioners on your physician's team can help you by facilitating care more promptly, often allowing for earlier appointments, access to diagnostic testing, procedures, and other specialty referrals.   Due to recent changes in healthcare laws, you  may see the results of your imaging and laboratory studies on  MyChart before your provider has had a chance to review them.  We understand that in some cases there may be results that are confusing or concerning to you. Not all laboratory results come back in the same time frame and the provider may be waiting for multiple results in order to interpret others.  Please give us  48 hours in order for your provider to thoroughly review all the results before contacting the office for clarification of your results.

## 2024-01-01 NOTE — Progress Notes (Signed)
 ____________________________________________________________  Attending physician addendum:  Thank you for sending this case to me. I have reviewed the entire note and agree with the plan.  If the barium study is similar to before, then please schedule him for an EGD with me in the [facility] as soon as possible for wire-guided dilation.  To clarify, endoscopic dilation for cricopharyngeal bar is indicated.  However, in my experience it is not therapeutic as often as our ENT colleagues seem to believe it will be.  If performed and does not relieve his dysphagia, then esophageal high-res manometry can be performed.  That said, the [facility] may be booked several months out due to current availability, which will be a challenge in a patient with this degree of symptoms.  So after an upper endoscopy I will refer this patient back to ENT for consideration of treatment such as Botox (which we do not perform).  Victory Brand, MD  ____________________________________________________________

## 2024-01-04 ENCOUNTER — Encounter: Payer: Self-pay | Admitting: Gastroenterology

## 2024-01-04 ENCOUNTER — Ambulatory Visit: Payer: Self-pay | Admitting: Physician Assistant

## 2024-01-04 ENCOUNTER — Ambulatory Visit (HOSPITAL_COMMUNITY)
Admission: RE | Admit: 2024-01-04 | Discharge: 2024-01-04 | Disposition: A | Discharge status: Home / Self Care | Source: Ambulatory Visit | Attending: Physician Assistant | Admitting: Physician Assistant

## 2024-01-04 DIAGNOSIS — R1319 Other dysphagia: Secondary | ICD-10-CM

## 2024-01-05 ENCOUNTER — Ambulatory Visit: Admitting: Gastroenterology

## 2024-01-05 ENCOUNTER — Encounter: Payer: Self-pay | Admitting: Gastroenterology

## 2024-01-05 VITALS — BP 118/73 | HR 67 | Temp 97.4°F | Resp 15 | Ht 70.0 in | Wt 196.0 lb

## 2024-01-05 DIAGNOSIS — R1319 Other dysphagia: Secondary | ICD-10-CM

## 2024-01-05 DIAGNOSIS — R1313 Dysphagia, pharyngeal phase: Secondary | ICD-10-CM

## 2024-01-05 DIAGNOSIS — R1310 Dysphagia, unspecified: Secondary | ICD-10-CM

## 2024-01-05 DIAGNOSIS — R1314 Dysphagia, pharyngoesophageal phase: Secondary | ICD-10-CM | POA: Diagnosis not present

## 2024-01-05 DIAGNOSIS — K2940 Chronic atrophic gastritis without bleeding: Secondary | ICD-10-CM | POA: Diagnosis not present

## 2024-01-05 DIAGNOSIS — K449 Diaphragmatic hernia without obstruction or gangrene: Secondary | ICD-10-CM

## 2024-01-05 MED ORDER — SODIUM CHLORIDE 0.9 % IV SOLN: 500.0000 mL | INTRAVENOUS | Status: DC

## 2024-01-05 NOTE — Progress Notes (Signed)
 Called to room to assist during endoscopic procedure.  Patient ID and intended procedure confirmed with present staff. Received instructions for my participation in the procedure from the performing physician.

## 2024-01-05 NOTE — Progress Notes (Signed)
 Pt's states no medical or surgical changes since previsit or office visit.

## 2024-01-05 NOTE — Patient Instructions (Addendum)
 Handouts given: Hiatal Hernia Resume previous diet. Continue present medications.  Await pathology results. Assess for response to dilation. If none, re-evaluation by ENT for consideration of Botox treatment of UES.  YOU HAD AN ENDOSCOPIC PROCEDURE TODAY AT THE [facility]:   Refer to the procedure report that was given to you for any specific questions about what was found during the examination.  If the procedure report does not answer your questions, please call your gastroenterologist to clarify.  If you requested that your care partner not be given the details of your procedure findings, then the procedure report has been included in a sealed envelope for you to review at your convenience later.  YOU SHOULD EXPECT: Some feelings of bloating in the abdomen. Passage of more gas than usual.  Walking can help get rid of the air that was put into your GI tract during the procedure and reduce the bloating. If you had a lower endoscopy (such as a colonoscopy or flexible sigmoidoscopy) you may notice spotting of blood in your stool or on the toilet paper. If you underwent a bowel prep for your procedure, you may not have a normal bowel movement for a few days.  Please Note:  You might notice some irritation and congestion in your nose or some drainage.  This is from the oxygen used during your procedure.  There is no need for concern and it should clear up in a day or so.  SYMPTOMS TO REPORT IMMEDIATELY:  Following upper endoscopy (EGD)  Vomiting of blood or coffee ground material  New chest pain or pain under the shoulder blades  Painful or persistently difficult swallowing  New shortness of breath  Fever of 100F or higher  Black, tarry-looking stools  For urgent or emergent issues, a gastroenterologist can be reached at any hour by calling [phone]. Do not use MyChart messaging for urgent concerns.    DIET:  We do recommend a small meal at first, but then you may proceed  to your regular diet.  Drink plenty of fluids but you should avoid alcoholic beverages for 24 hours.  ACTIVITY:  You should plan to take it easy for the rest of today and you should NOT DRIVE or use heavy machinery until tomorrow (because of the sedation medicines used during the test).    FOLLOW UP: Our staff will call the number listed on your records the next business day following your procedure.  We will call around 7:15- 8:00 am to check on you and address any questions or concerns that you may have regarding the information given to you following your procedure. If we do not reach you, we will leave a message.     If any biopsies were taken you will be contacted by phone or by letter within the next 1-3 weeks.  Please call us  at [phone] if you have not heard about the biopsies in 3 weeks.    SIGNATURES/CONFIDENTIALITY: You and/or your care partner have signed paperwork which will be entered into your electronic medical record.  These signatures attest to the fact that that the information above on your After Visit Summary has been reviewed and is understood.  Full responsibility of the confidentiality of this discharge information lies with you and/or your care-partner.

## 2024-01-05 NOTE — Progress Notes (Signed)
1410 Robinul 0.1 mg IV given due large amount of secretions upon assessment.  MD made aware, vss  

## 2024-01-05 NOTE — Op Note (Signed)
 [facility] Patient Name: Theodore [name] Procedure Date: 01/05/2024 2:01 PM MRN: [id number] Endoscopist: Victory L. 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 The patient's tolerance of                            previous anesthesia was also reviewed. The risks                            and benefits of the procedure and the sedation                            options and risks were discussed with the patient.                            All questions were answered, and informed consent                            was obtained. Prior Anticoagulants: The patient has                            taken no anticoagulant or antiplatelet agents. ASA                            Grade Assessment: III - A patient with severe                            systemic disease. After reviewing the risks and                            benefits, the patient was deemed in satisfactory  condition to undergo the procedure.                           After obtaining informed consent, the endoscope was                            passed under direct vision. Throughout the                            procedure, the patient's blood pressure, pulse, and                            oxygen saturations were monitored continuously. The                            GIF [id number] #[id number]  was introduced through the                            mouth, and advanced to the second part of duodenum.                            The upper GI endoscopy was accomplished without                            difficulty. The patient tolerated the procedure                            well. Scope In: Scope Out: Findings:                 A 2-3 cm hiatal hernia was present.                           No endoscopic abnormality was evident in the                            esophagus to explain the patient's complaint of                            dysphagia. It was decided, however, to proceed with                            dilation at the cricopharyngeus. A guidewire was                            placed and the scope was withdrawn. Dilation was                            performed with a Savary dilator with mild                            resistance at 15 mm and moderate resistance at 17  mm. The dilation site was examined and showed mild                            mucosal disruption and mild improvement in luminal                            narrowing.                           Diffuse atrophic mucosa was found in the gastric                            antrum. Several biopsies were obtained on the                            greater curvature of the gastric body, on the                            lesser curvature of the gastric body, on the                            greater curvature of the gastric antrum and on the                            lesser curvature of the gastric antrum with cold                            forceps for histology.                           The exam of the stomach was otherwise normal.                           The cardia and gastric fundus were normal on                            retroflexion.                           The examined duodenum was normal. Complications:            No immediate complications. Estimated Blood Loss:     Estimated  blood loss was minimal. Impression:               - 2-3 cm hiatal hernia.                           - No endoscopic esophageal abnormality to explain                            patient's dysphagia. Esophagus dilated. Dilated.                           - Gastric mucosal atrophy.                           -  Normal examined duodenum.                           - Several biopsies were obtained on the greater                            curvature of the gastric body, on the lesser                            curvature of the gastric body, on the greater                            curvature of the gastric antrum and on the lesser                            curvature of the gastric antrum. Recommendation:           - Patient has a contact number available for                            emergencies. The signs and symptoms of potential                            delayed complications were discussed with the                            patient. Return to normal activities tomorrow.                            Written discharge instructions were provided to the                            patient.                           - Resume previous diet.                           - Continue present medications.                           - Await pathology results.                           - Asess for response to dilation. If none,                            re-evaluation by ENT for consideration of Botox                            treatment of UES. Isatou Agredano L. Legrand, MD 01/05/2024 2:42:14 PM This report has been signed electronically.

## 2024-01-05 NOTE — Progress Notes (Signed)
 No significant changes to clinical history since GI office visit on 12/31/23.  The patient is appropriate for an endoscopic procedure in the ambulatory setting.  - Victory Brand, MD

## 2024-01-05 NOTE — Progress Notes (Signed)
 1430 Report given to PACU, vss

## 2024-01-06 ENCOUNTER — Telehealth: Payer: Self-pay | Admitting: Lactation Services

## 2024-01-06 NOTE — Telephone Encounter (Signed)
 No answer; no mailbox recording to leave message

## 2024-01-12 LAB — SURGICAL PATHOLOGY

## 2024-01-17 ENCOUNTER — Ambulatory Visit: Payer: Self-pay | Admitting: Gastroenterology

## 2024-01-22 ENCOUNTER — Ambulatory Visit: Payer: Medicare Other

## 2024-01-22 VITALS — Ht 70.5 in | Wt 196.0 lb

## 2024-01-22 DIAGNOSIS — Z0000 Encounter for general adult medical examination without abnormal findings: Secondary | ICD-10-CM | POA: Diagnosis not present

## 2024-01-22 NOTE — Progress Notes (Signed)
 Chief Complaint  Patient presents with   Medicare Wellness     Subjective:   Theodore [name] is a 89 y.o. male who presents for a Medicare Annual Wellness Visit.  Visit info / Clinical Intake: Medicare Wellness Visit Type:: Subsequent Annual Wellness Visit Persons participating in visit and providing information:: patient Medicare Wellness Visit Mode:: Telephone If telephone:: video declined Since this visit was completed virtually, some vitals may be partially provided or unavailable. Missing vitals are due to the limitations of the virtual format.: Unable to obtain vitals - no equipment If Telephone or Video please confirm:: I connected with patient using audio/video enable telemedicine. I verified patient identity with two identifiers, discussed telehealth limitations, and patient agreed to proceed. Patient Location:: home Provider Location:: home office Interpreter Needed?: No Pre-visit prep was completed: yes AWV questionnaire completed by patient prior to visit?: yes Date:: 01/18/24 Living arrangements:: (Patient-Rptd) lives with spouse/significant other Patient's Overall Health Status Rating: (Patient-Rptd) good Typical amount of pain: (Patient-Rptd) some Does pain affect daily life?: (Patient-Rptd) no Are you currently prescribed opioids?: no  Dietary Habits and Nutritional Risks How many meals a day?: (Patient-Rptd) 2 Eats fruit and vegetables daily?: (Patient-Rptd) yes Most meals are obtained by: (Patient-Rptd) preparing own meals In the last 2 weeks, have you had any of the following?: none Diabetic:: no  Functional Status Activities of Daily Living (to include ambulation/medication): (Patient-Rptd) Independent Ambulation: (Patient-Rptd) Independent Medication Administration: (Patient-Rptd) Independent Home Management (perform basic housework or laundry): (Patient-Rptd) Independent Manage your own finances?: (Patient-Rptd) yes Primary transportation is:  (Patient-Rptd) driving Concerns about vision?: no *vision screening is required for WTM* Concerns about hearing?: no  Fall Screening Falls in the past year?: (Patient-Rptd) 0 Number of falls in past year: 0 Was there an injury with Fall?: 0 Fall Risk Category Calculator: 0 Patient Fall Risk Level: Low Fall Risk  Fall Risk Patient at Risk for Falls Due to: No Fall Risks Fall risk Follow up: Falls evaluation completed; Education provided; Falls prevention discussed  Home and Transportation Safety: All rugs have non-skid backing?: (Patient-Rptd) yes All stairs or steps have railings?: (Patient-Rptd) yes Grab bars in the bathtub or shower?: (Patient-Rptd) yes Have non-skid surface in bathtub or shower?: (!) (Patient-Rptd) no Good home lighting?: (Patient-Rptd) yes Regular seat belt use?: (Patient-Rptd) yes Hospital stays in the last year:: (Patient-Rptd) no  Cognitive Assessment Difficulty concentrating, remembering, or making decisions? : (Patient-Rptd) no Will 6CIT or Mini Cog be Completed: yes What year is it?: 0 points What month is it?: 0 points Give patient an address phrase to remember (5 components): [address] [place]  About what time is it?: 0 points Count backwards from 20 to 1: 0 points Say the months of the year in reverse: 0 points Repeat the address phrase from earlier: 0 points 6 CIT Score: 0 points  Advance Directives (For Healthcare) Does Patient Have a Medical Advance Directive?: Yes Does patient want to make changes to medical advance directive?: No - Patient declined Type of Advance Directive: Healthcare Power of [place]; Living will Copy of Healthcare Power of Attorney in Chart?: Yes - validated most recent copy scanned in chart (See row information) Copy of Living Will in Chart?: Yes - validated most recent copy scanned in chart (See row information)  Reviewed/Updated  Reviewed/Updated: Reviewed All (Medical, Surgical, Family, Medications,  Allergies, Care Teams, Patient Goals)    Allergies (verified) Penicillins, Amlodipine , Effexor  [venlafaxine ], Lisinopril, Mevacor [lovastatin], Zocor [simvastatin], Hydrocodone -acetaminophen , and Tamiflu [oseltamivir phosphate]   Current Medications (  verified) Outpatient Encounter Medications as of 01/22/2024  Medication Sig   acetaminophen  (TYLENOL ) 500 MG tablet Take 1 tablet (500 mg total) by mouth 3 (three) times daily as needed.   aspirin  EC 81 MG tablet Take 1 tablet (81 mg total) by mouth daily. Swallow whole.   atorvastatin  (LIPITOR) 40 MG tablet Take 1 tablet (40 mg total) by mouth daily.   BREZTRI  AEROSPHERE 160-9-4.8 MCG/ACT AERO inhaler USE 2 INHALATIONS BY MOUTH TWICE DAILY   calcium -vitamin D  (OSCAL WITH D) 500-5 MG-MCG tablet Take 1 tablet by mouth.   cyanocobalamin  (VITAMIN B12) 500 MCG tablet Take 1 tablet (500 mcg total) by mouth every Monday, Wednesday, and Friday.   dapagliflozin  propanediol (FARXIGA ) 10 MG TABS tablet Take 1 tablet (10 mg total) by mouth daily before breakfast.   ezetimibe  (ZETIA ) 10 MG tablet Take 1 tablet (10 mg total) by mouth daily.   furosemide  (LASIX ) 20 MG tablet Take 1 tablet (20 mg total) by mouth daily.   gabapentin  (NEURONTIN ) 300 MG capsule Take 900 mg by mouth at bedtime.   ipratropium-albuterol  (DUONEB) 0.5-2.5 (3) MG/3ML SOLN 1 neb every 6 hours as needed   metoprolol  succinate (TOPROL -XL) 50 MG 24 hr tablet Take 1 tablet (50 mg total) by mouth daily. Take with or immediately following a meal.   OVER THE COUNTER MEDICATION Take 1 tablet by mouth in the morning and at bedtime. CALCIUM - 600 mg with D3 400 mg   pantoprazole  (PROTONIX ) 40 MG tablet Take 1 tablet (40 mg total) by mouth daily.   potassium chloride  (KLOR-CON  M) 10 MEQ tablet Take 1 tablet (10 mEq total) by mouth daily.   sacubitril -valsartan  (ENTRESTO ) 24-26 MG Take 1 tablet by mouth 2 (two) times daily.   traZODone  (DESYREL ) 50 MG tablet TAKE 1/2 TO 1 TABLET BY MOUTH AT BEDTIME  AS NEEDED FOR SLEEP   vitamin C  (ASCORBIC ACID ) 500 MG tablet Take 500 mg by mouth daily.   benzonatate  (TESSALON ) 200 MG capsule Take 1 capsule (200 mg total) by mouth 3 (three) times daily as needed for cough. (Patient not taking: Reported on 01/22/2024)   No facility-administered encounter medications on file as of 01/22/2024.    History: Past Medical History:  Diagnosis Date   Arthritis Years   Atypical atrial flutter (HCC) 12/31/2021   Blood transfusion without reported diagnosis Years ago   CAP (community acquired pneumonia) 01/19/2018   Cataract 1992   CHF (congestive heart failure) (HCC) Years ago   ef 35%   Emphysema of lung (HCC) 2014   GERD (gastroesophageal reflux disease) Years   Heart murmur Years ago   History of arthritis    History of rheumatic fever    Hypercholesterolemia    Hypertension Years ago   Nocturia    PAF (paroxysmal atrial fibrillation) (HCC) 02/2011   Placed on Pradaxa . Did not require cardioversion; spontaneously converted   Peripheral edema    Persistent atrial fibrillation (HCC) 01/21/2011   S/p ablation 12/2015 NSR up until his PCI Dec 2020 when he went into AF post PCI   Presence of Watchman left atrial appendage closure device 09/24/2022   27mm Watchman FLX Pro placed by Dr. Cindie   Right bundle branch block    SOB (shortness of breath)    Squamous cell carcinoma of scalp 04/2022   at least in situ   Tendonitis of elbow, left    Past Surgical History:  Procedure Laterality Date   ATRIAL FIBRILLATION ABLATION  12/19/2015   BACK SURGERY  12/24/2009  fusion C3-C4   BACK SURGERY  2010   fusion L4-L5 Darrick)   CARDIAC CATHETERIZATION  2009   NONOBSTRUCTIVE ATHERSCLEROTIC CORONARY DISEASE AND NORMAL  LV FUNCTION   CARDIAC CATHETERIZATION N/A 09/08/2014   Procedure: Left Heart Cath and Coronary Angiography;  Surgeon: Victory LELON Sharps, MD;  Location: [facility] INVASIVE CV LAB;  Service: Cardiovascular;  Laterality: N/A;   CARDIAC CATHETERIZATION  N/A 09/28/2015   Procedure: Left Heart Cath and Coronary Angiography;  Surgeon: Alm LELON Clay, MD;  Location: [facility] INVASIVE CV LAB;  Service: Cardiovascular;  Laterality: N/A;   CARDIAC CATHETERIZATION N/A 09/28/2015   Procedure: Intravascular Pressure Wire/FFR Study;  Surgeon: Alm LELON Clay, MD;  Location: [facility] INVASIVE CV LAB;  Service: Cardiovascular;  Laterality: N/A;   CARDIOVERSION N/A 10/23/2015   Procedure: CARDIOVERSION;  Surgeon: Redell GORMAN Shallow, MD;  Location: [facility] ENDOSCOPY;  Service: Cardiovascular;  Laterality: N/A;   CARDIOVERSION N/A 01/01/2022   Procedure: CARDIOVERSION;  Surgeon: Hobart Powell BRAVO, MD;  Location: [facility] ENDOSCOPY;  Service: Cardiovascular;  Laterality: N/A;   COLONOSCOPY  2013   per patient, rpt 5 yrs   COLONOSCOPY WITH PROPOFOL  N/A 12/02/2018   TAx2, HP, diverticulosis Robertha Agent, MD)   CORONARY ATHERECTOMY N/A 01/11/2019   Procedure: CORONARY ATHERECTOMY;  Surgeon: Verlin Lonni BIRCH, MD;  Location: [facility] INVASIVE CV LAB;  Service: Cardiovascular;  Laterality: N/A;   CORONARY PRESSURE/FFR STUDY N/A 01/10/2019   Procedure: INTRAVASCULAR PRESSURE WIRE/FFR STUDY;  Surgeon: Clay Alm LELON, MD;  Location: [facility] INVASIVE CV LAB;  Service: Cardiovascular;  Laterality: N/A;   CORONARY STENT INTERVENTION N/A 01/11/2019   Procedure: CORONARY STENT INTERVENTION;  Surgeon: Verlin Lonni BIRCH, MD;  Location: [facility] INVASIVE CV LAB;  Service: Cardiovascular;  Laterality: N/A;   ELECTROPHYSIOLOGIC STUDY N/A 12/19/2015   Procedure: Atrial Fibrillation Ablation;  Surgeon: Will Gladis Norton, MD;  Location: [facility] INVASIVE CV LAB;  Service: Cardiovascular;  Laterality: N/A;   ESOPHAGOGASTRODUODENOSCOPY (EGD) WITH PROPOFOL  N/A 12/02/2018   medium HH Robertha Agent, MD)   EYE SURGERY  1992   JOINT REPLACEMENT  2006, 2019   KNEE ARTHROSCOPY Left 03/2016   Dr. Ernie   LEFT ATRIAL APPENDAGE OCCLUSION N/A 09/24/2022   Procedure: LEFT ATRIAL APPENDAGE OCCLUSION;  Surgeon:  Cindie Ole DASEN, MD;  Location: [facility] INVASIVE CV LAB;  Service: Cardiovascular;  Laterality: N/A;   LEFT HEART CATH AND CORONARY ANGIOGRAPHY N/A 01/10/2019   Procedure: LEFT HEART CATH AND CORONARY ANGIOGRAPHY;  Surgeon: Clay Alm LELON, MD;  Location: [facility] INVASIVE CV LAB;  Service: Cardiovascular;  Laterality: N/A;   PATELLAR TENDON REPAIR Left 2008   POLYPECTOMY  12/02/2018   Procedure: POLYPECTOMY;  Surgeon: Celestia Agent, MD;  Location: [facility] ENDOSCOPY;  Service: Endoscopy;;   SPINE SURGERY  2 in early 2000s   TEE WITHOUT CARDIOVERSION N/A 09/24/2022   Procedure: TRANSESOPHAGEAL ECHOCARDIOGRAM;  Surgeon: Cindie Ole DASEN, MD;  Location: [facility] INVASIVE CV LAB;  Service: Cardiovascular;  Laterality: N/A;   TOTAL KNEE ARTHROPLASTY Right 2006   TOTAL KNEE ARTHROPLASTY Left 02/09/2017   Procedure: LEFT TOTAL KNEE ARTHROPLASTY, EXCISION LEFT DISTAL THIGH MASS;  Surgeon: Ernie Cough, MD;  Location: [facility] ORS;  Service: Orthopedics;  Laterality: Left;  90 mins   TRICEPS TENDON REPAIR Left 2013   Family History  Problem Relation Age of Onset   Congestive Heart Failure Father 30   COPD Father    Hypertension Father    Heart failure Father    Asthma Sister    Heart disease Sister  Heart disease Sister        CHF   Heart disease Brother        CHF   COPD Brother    Stroke Neg Hx    Colon cancer Neg Hx    Stomach cancer Neg Hx    CAD Neg Hx        MI   Social History   Occupational History   Occupation: retired    Associate Professor: RETIRED    Comment: acupuncturist  Tobacco Use   Smoking status: Never    Passive exposure: Past (first 20 years of his life)   Smokeless tobacco: Never   Tobacco comments:    Never smoke 01/13/22  Vaping Use   Vaping status: Never Used  Substance and Sexual Activity   Alcohol use: Yes    Alcohol/week: 10.0 standard drinks of alcohol    Types: 10 Cans of beer per week    Comment: 2 beers daily 01/13/22   Drug use: No   Sexual activity: Not  Currently   Tobacco Counseling Counseling given: Not Answered Tobacco comments: Never smoke 01/13/22  SDOH Screenings   Food Insecurity: No Food Insecurity (01/18/2024)  Housing: Unknown (01/18/2024)  Transportation Needs: No Transportation Needs (01/18/2024)  Utilities: Not At Risk (01/22/2024)  Alcohol Screen: Low Risk (01/18/2024)  Depression (PHQ2-9): Low Risk (01/22/2024)  Financial Resource Strain: Low Risk (01/18/2024)  Physical Activity: Inactive (01/18/2024)  Social Connections: Moderately Integrated (01/18/2024)  Stress: No Stress Concern Present (01/18/2024)  Tobacco Use: Low Risk (01/22/2024)  Health Literacy: Adequate Health Literacy (01/22/2024)   See flowsheets for full screening details  Depression Screen PHQ 2 & 9 Depression Scale- Over the past 2 weeks, how often have you been bothered by any of the following problems? Little interest or pleasure in doing things: 0 Feeling down, depressed, or hopeless (PHQ Adolescent also includes...irritable): 0 PHQ-2 Total Score: 0     Goals Addressed             This Visit's Progress    Increase physical activity   On track    Patient Stated   On track    Starting 08/06/18, I will continue to take medications as prescribed.      COMPLETED: Patient Stated       08/09/2019, I will maintain and continue medications as prescribed.      Patient Stated   On track    Would like to maintain current health status             Objective:    Today's Vitals   01/22/24 0814  Weight: 196 lb (88.9 kg)  Height: 5' 10.5 (1.791 m)   Body mass index is 27.73 kg/m.  Hearing/Vision screen Vision Screening - Comments:: UTD w/ Dr Dingeldein in Feb Immunizations and Health Maintenance Health Maintenance  Topic Date Due   DTaP/Tdap/Td (1 - Tdap) Never done   Influenza Vaccine  09/11/2023   COVID-19 Vaccine (7 - 2025-26 season) 10/12/2023   Medicare Annual Wellness (AWV)  01/21/2024   Pneumococcal Vaccine: 50+ Years  Completed    Zoster Vaccines- Shingrix  Completed   Meningococcal B Vaccine  Aged Out        Assessment/Plan:  This is a routine wellness examination for Jule.  Patient Care Team: Rilla Baller, MD as PCP - General (Family Medicine) Inocencio Soyla Lunger, MD as PCP - Electrophysiology (Cardiology) Raford Riggs, MD as PCP - Cardiology (Cardiology) Raford Riggs, MD as Attending Physician (Cardiology) Ivin Kocher, MD as  Consulting Physician (Dermatology) Neysa Reggy BIRCH, MD as Consulting Physician (Pulmonary Disease) Dingeldein, Elspeth, MD as Referring Physician (Ophthalmology)  I have personally reviewed and noted the following in the patients chart:   Medical and social history Use of alcohol, tobacco or illicit drugs  Current medications and supplements including opioid prescriptions. Functional ability and status Nutritional status Physical activity Advanced directives List of other physicians Hospitalizations, surgeries, and ER visits in previous 12 months Vitals Screenings to include cognitive, depression, and falls Referrals and appointments  No orders of the defined types were placed in this encounter.  In addition, I have reviewed and discussed with patient certain preventive protocols, quality metrics, and best practice recommendations. A written personalized care plan for preventive services as well as general preventive health recommendations were provided to patient.   Erminio LITTIE Saris, LPN   [id number]   No follow-ups on file.  After Visit Summary: (MyChart) Due to this being a telephonic visit, the after visit summary with patients personalized plan was offered to patient via MyChart   Nurse Notes:   Pt voices concern that he still has some difficulty swallowing after procedure to stretch espohagus. He wants to ask some non-urgent questions. Patient advised to keep follow-up appointment with PCP (03/07/24) Appointment(s) made: (AWV/CPE/Lab for  Jan 2027) HM Addressed: Vaccines Due: pt indicates he had Influenza vaccine in Sept 2025 at his local [facility]

## 2024-01-22 NOTE — Patient Instructions (Signed)
 Mr. Clouatre,  Thank you for taking the time for your Medicare Wellness Visit. I appreciate your continued commitment to your health goals. Please review the care plan we discussed, and feel free to reach out if I can assist you further.  Please note that Annual Wellness Visits do not include a physical exam. Some assessments may be limited, especially if the visit was conducted virtually. If needed, we may recommend an in-person follow-up with your provider.  Ongoing Care Seeing your primary care provider every 3 to 6 months helps us  monitor your health and provide consistent, personalized care.   Referrals If a referral was made during today's visit and you haven't received any updates within two weeks, please contact the referred provider directly to check on the status.  Recommended Screenings:  Health Maintenance  Topic Date Due   DTaP/Tdap/Td vaccine (1 - Tdap) Never done   Flu Shot  09/11/2023   COVID-19 Vaccine (7 - 2025-26 season) 10/12/2023   Medicare Annual Wellness Visit  01/21/2024   Pneumococcal Vaccine for age over 57  Completed   Zoster (Shingles) Vaccine  Completed   Meningitis B Vaccine  Aged Out       01/18/2024    8:53 AM  Advanced Directives  Does Patient Have a Medical Advance Directive? Yes    Vision: Annual vision screenings are recommended for early detection of glaucoma, cataracts, and diabetic retinopathy. These exams can also reveal signs of chronic conditions such as diabetes and high blood pressure.  Dental: Annual dental screenings help detect early signs of oral cancer, gum disease, and other conditions linked to overall health, including heart disease and diabetes.

## 2024-02-28 ENCOUNTER — Other Ambulatory Visit: Payer: Self-pay | Admitting: Family Medicine

## 2024-02-28 DIAGNOSIS — E611 Iron deficiency: Secondary | ICD-10-CM

## 2024-02-28 DIAGNOSIS — M8580 Other specified disorders of bone density and structure, unspecified site: Secondary | ICD-10-CM

## 2024-02-28 DIAGNOSIS — E7800 Pure hypercholesterolemia, unspecified: Secondary | ICD-10-CM

## 2024-02-28 DIAGNOSIS — G609 Hereditary and idiopathic neuropathy, unspecified: Secondary | ICD-10-CM

## 2024-02-28 DIAGNOSIS — D751 Secondary polycythemia: Secondary | ICD-10-CM

## 2024-02-28 DIAGNOSIS — I480 Paroxysmal atrial fibrillation: Secondary | ICD-10-CM

## 2024-02-29 ENCOUNTER — Other Ambulatory Visit (INDEPENDENT_AMBULATORY_CARE_PROVIDER_SITE_OTHER): Payer: Medicare Other

## 2024-02-29 DIAGNOSIS — D751 Secondary polycythemia: Secondary | ICD-10-CM

## 2024-02-29 DIAGNOSIS — E611 Iron deficiency: Secondary | ICD-10-CM

## 2024-02-29 DIAGNOSIS — G609 Hereditary and idiopathic neuropathy, unspecified: Secondary | ICD-10-CM | POA: Diagnosis not present

## 2024-02-29 DIAGNOSIS — E7800 Pure hypercholesterolemia, unspecified: Secondary | ICD-10-CM | POA: Diagnosis not present

## 2024-02-29 DIAGNOSIS — M8580 Other specified disorders of bone density and structure, unspecified site: Secondary | ICD-10-CM

## 2024-02-29 LAB — CBC WITH DIFFERENTIAL/PLATELET
Basophils Absolute: 0 K/uL (ref 0.0–0.1)
Basophils Relative: 0.3 % (ref 0.0–3.0)
Eosinophils Absolute: 0.1 K/uL (ref 0.0–0.7)
Eosinophils Relative: 1.2 % (ref 0.0–5.0)
HCT: 48.6 % (ref 39.0–52.0)
Hemoglobin: 16.8 g/dL (ref 13.0–17.0)
Lymphocytes Relative: 30.7 % (ref 12.0–46.0)
Lymphs Abs: 2.2 K/uL (ref 0.7–4.0)
MCHC: 34.6 g/dL (ref 30.0–36.0)
MCV: 99.4 fl (ref 78.0–100.0)
Monocytes Absolute: 0.5 K/uL (ref 0.1–1.0)
Monocytes Relative: 7 % (ref 3.0–12.0)
Neutro Abs: 4.3 K/uL (ref 1.4–7.7)
Neutrophils Relative %: 60.8 % (ref 43.0–77.0)
Platelets: 120 K/uL — ABNORMAL LOW (ref 150.0–400.0)
RBC: 4.89 Mil/uL (ref 4.22–5.81)
RDW: 13.6 % (ref 11.5–15.5)
WBC: 7 K/uL (ref 4.0–10.5)

## 2024-02-29 LAB — COMPREHENSIVE METABOLIC PANEL WITH GFR
ALT: 16 U/L (ref 3–53)
AST: 18 U/L (ref 5–37)
Albumin: 4.1 g/dL (ref 3.5–5.2)
Alkaline Phosphatase: 57 U/L (ref 39–117)
BUN: 20 mg/dL (ref 6–23)
CO2: 31 meq/L (ref 19–32)
Calcium: 9.4 mg/dL (ref 8.4–10.5)
Chloride: 103 meq/L (ref 96–112)
Creatinine, Ser: 1.01 mg/dL (ref 0.40–1.50)
GFR: 66.89 mL/min
Glucose, Bld: 89 mg/dL (ref 70–99)
Potassium: 4.1 meq/L (ref 3.5–5.1)
Sodium: 141 meq/L (ref 135–145)
Total Bilirubin: 0.9 mg/dL (ref 0.2–1.2)
Total Protein: 6.2 g/dL (ref 6.0–8.3)

## 2024-02-29 LAB — IBC PANEL
Iron: 164 ug/dL (ref 42–165)
Saturation Ratios: 41.4 % (ref 20.0–50.0)
TIBC: 396.2 ug/dL (ref 250.0–450.0)
Transferrin: 283 mg/dL (ref 212.0–360.0)

## 2024-02-29 LAB — LIPID PANEL
Cholesterol: 107 mg/dL (ref 28–200)
HDL: 41.2 mg/dL
LDL Cholesterol: 49 mg/dL (ref 10–99)
NonHDL: 66.09
Total CHOL/HDL Ratio: 3
Triglycerides: 86 mg/dL (ref 10.0–149.0)
VLDL: 17.2 mg/dL (ref 0.0–40.0)

## 2024-02-29 LAB — VITAMIN D 25 HYDROXY (VIT D DEFICIENCY, FRACTURES): VITD: 35.28 ng/mL (ref 30.00–100.00)

## 2024-02-29 LAB — FERRITIN: Ferritin: 32.4 ng/mL (ref 22.0–322.0)

## 2024-03-04 LAB — VITAMIN B1: Vitamin B1 (Thiamine): 10 nmol/L (ref 8–30)

## 2024-03-05 ENCOUNTER — Other Ambulatory Visit (HOSPITAL_BASED_OUTPATIENT_CLINIC_OR_DEPARTMENT_OTHER): Payer: Self-pay | Admitting: Cardiovascular Disease

## 2024-03-05 ENCOUNTER — Other Ambulatory Visit (HOSPITAL_BASED_OUTPATIENT_CLINIC_OR_DEPARTMENT_OTHER): Payer: Self-pay | Admitting: Family

## 2024-03-05 ENCOUNTER — Other Ambulatory Visit: Payer: Self-pay | Admitting: Family Medicine

## 2024-03-05 DIAGNOSIS — I5022 Chronic systolic (congestive) heart failure: Secondary | ICD-10-CM

## 2024-03-05 DIAGNOSIS — I25118 Atherosclerotic heart disease of native coronary artery with other forms of angina pectoris: Secondary | ICD-10-CM

## 2024-03-07 ENCOUNTER — Ambulatory Visit: Payer: Self-pay | Admitting: Family Medicine

## 2024-03-07 ENCOUNTER — Encounter: Payer: Medicare Other | Admitting: Family Medicine

## 2024-03-14 ENCOUNTER — Ambulatory Visit: Admitting: Family Medicine

## 2024-03-22 ENCOUNTER — Ambulatory Visit: Admitting: Family Medicine

## 2025-03-03 ENCOUNTER — Other Ambulatory Visit

## 2025-03-03 ENCOUNTER — Ambulatory Visit

## 2025-03-10 ENCOUNTER — Encounter: Admitting: Family Medicine
